# Patient Record
Sex: Male | Born: 1935 | Race: White | Hispanic: No | Marital: Married | Smoking: Former smoker
Health system: Southern US, Community
[De-identification: ages and names within clinical notes are randomized; demographics above are authoritative.]

## PROBLEM LIST (undated history)

## (undated) DIAGNOSIS — K469 Unspecified abdominal hernia without obstruction or gangrene: Secondary | ICD-10-CM

## (undated) DIAGNOSIS — M4800 Spinal stenosis, site unspecified: Secondary | ICD-10-CM

## (undated) DIAGNOSIS — Z86718 Personal history of other venous thrombosis and embolism: Secondary | ICD-10-CM

## (undated) DIAGNOSIS — G609 Hereditary and idiopathic neuropathy, unspecified: Secondary | ICD-10-CM

## (undated) DIAGNOSIS — N329 Bladder disorder, unspecified: Secondary | ICD-10-CM

## (undated) DIAGNOSIS — K219 Gastro-esophageal reflux disease without esophagitis: Secondary | ICD-10-CM

## (undated) DIAGNOSIS — M4180 Other forms of scoliosis, site unspecified: Secondary | ICD-10-CM

## (undated) DIAGNOSIS — I272 Pulmonary hypertension, unspecified: Secondary | ICD-10-CM

## (undated) DIAGNOSIS — R269 Unspecified abnormalities of gait and mobility: Secondary | ICD-10-CM

## (undated) DIAGNOSIS — H3530 Unspecified macular degeneration: Secondary | ICD-10-CM

## (undated) DIAGNOSIS — J439 Emphysema, unspecified: Secondary | ICD-10-CM

## (undated) DIAGNOSIS — Z974 Presence of external hearing-aid: Secondary | ICD-10-CM

## (undated) DIAGNOSIS — I480 Paroxysmal atrial fibrillation: Secondary | ICD-10-CM

## (undated) DIAGNOSIS — G4733 Obstructive sleep apnea (adult) (pediatric): Secondary | ICD-10-CM

## (undated) DIAGNOSIS — R351 Nocturia: Secondary | ICD-10-CM

## (undated) DIAGNOSIS — Z950 Presence of cardiac pacemaker: Secondary | ICD-10-CM

## (undated) DIAGNOSIS — M5137 Other intervertebral disc degeneration, lumbosacral region: Secondary | ICD-10-CM

## (undated) DIAGNOSIS — D509 Iron deficiency anemia, unspecified: Secondary | ICD-10-CM

## (undated) DIAGNOSIS — M51379 Other intervertebral disc degeneration, lumbosacral region without mention of lumbar back pain or lower extremity pain: Secondary | ICD-10-CM

## (undated) DIAGNOSIS — R519 Headache, unspecified: Secondary | ICD-10-CM

## (undated) DIAGNOSIS — I340 Nonrheumatic mitral (valve) insufficiency: Secondary | ICD-10-CM

## (undated) DIAGNOSIS — N183 Chronic kidney disease, stage 3 unspecified: Secondary | ICD-10-CM

## (undated) DIAGNOSIS — Z8546 Personal history of malignant neoplasm of prostate: Secondary | ICD-10-CM

## (undated) DIAGNOSIS — Z8719 Personal history of other diseases of the digestive system: Secondary | ICD-10-CM

## (undated) DIAGNOSIS — R310 Gross hematuria: Secondary | ICD-10-CM

## (undated) DIAGNOSIS — M545 Low back pain, unspecified: Secondary | ICD-10-CM

## (undated) DIAGNOSIS — K08109 Complete loss of teeth, unspecified cause, unspecified class: Secondary | ICD-10-CM

## (undated) DIAGNOSIS — Z973 Presence of spectacles and contact lenses: Secondary | ICD-10-CM

## (undated) DIAGNOSIS — R51 Headache: Secondary | ICD-10-CM

## (undated) DIAGNOSIS — R0600 Dyspnea, unspecified: Secondary | ICD-10-CM

## (undated) DIAGNOSIS — R600 Localized edema: Secondary | ICD-10-CM

## (undated) DIAGNOSIS — Z9989 Dependence on other enabling machines and devices: Secondary | ICD-10-CM

## (undated) DIAGNOSIS — C61 Malignant neoplasm of prostate: Secondary | ICD-10-CM

## (undated) DIAGNOSIS — Z8709 Personal history of other diseases of the respiratory system: Secondary | ICD-10-CM

## (undated) DIAGNOSIS — Z923 Personal history of irradiation: Secondary | ICD-10-CM

## (undated) DIAGNOSIS — G8929 Other chronic pain: Secondary | ICD-10-CM

## (undated) DIAGNOSIS — I872 Venous insufficiency (chronic) (peripheral): Secondary | ICD-10-CM

## (undated) DIAGNOSIS — D638 Anemia in other chronic diseases classified elsewhere: Secondary | ICD-10-CM

## (undated) DIAGNOSIS — C801 Malignant (primary) neoplasm, unspecified: Secondary | ICD-10-CM

## (undated) DIAGNOSIS — I10 Essential (primary) hypertension: Secondary | ICD-10-CM

## (undated) DIAGNOSIS — K449 Diaphragmatic hernia without obstruction or gangrene: Secondary | ICD-10-CM

## (undated) DIAGNOSIS — Z972 Presence of dental prosthetic device (complete) (partial): Secondary | ICD-10-CM

## (undated) DIAGNOSIS — I495 Sick sinus syndrome: Secondary | ICD-10-CM

## (undated) DIAGNOSIS — G5602 Carpal tunnel syndrome, left upper limb: Secondary | ICD-10-CM

## (undated) DIAGNOSIS — M1990 Unspecified osteoarthritis, unspecified site: Secondary | ICD-10-CM

## (undated) DIAGNOSIS — M4150 Other secondary scoliosis, site unspecified: Secondary | ICD-10-CM

## (undated) HISTORY — DX: Anemia in other chronic diseases classified elsewhere: D63.8

## (undated) HISTORY — DX: Carpal tunnel syndrome, left upper limb: G56.02

## (undated) HISTORY — DX: Unspecified macular degeneration: H35.30

## (undated) HISTORY — PX: TOTAL KNEE ARTHROPLASTY: SHX125

## (undated) HISTORY — PX: LAPAROSCOPIC ASSISTED VENTRAL HERNIA REPAIR: SHX6312

## (undated) HISTORY — PX: TRANSTHORACIC ECHOCARDIOGRAM: SHX275

## (undated) HISTORY — DX: Essential (primary) hypertension: I10

## (undated) HISTORY — DX: Unspecified abnormalities of gait and mobility: R26.9

## (undated) HISTORY — PX: SHOULDER ARTHROSCOPY WITH DISTAL CLAVICLE RESECTION: SHX5675

## (undated) HISTORY — DX: Venous insufficiency (chronic) (peripheral): I87.2

## (undated) HISTORY — PX: OTHER SURGICAL HISTORY: SHX169

## (undated) HISTORY — DX: Unspecified abdominal hernia without obstruction or gangrene: K46.9

## (undated) HISTORY — PX: CARDIAC CATHETERIZATION: SHX172

## (undated) HISTORY — PX: LAPAROSCOPIC INGUINAL HERNIA REPAIR: SUR788

## (undated) HISTORY — PX: COLONOSCOPY: SHX174

## (undated) HISTORY — PX: CARDIAC PACEMAKER PLACEMENT: SHX583

## (undated) HISTORY — PX: CATARACT EXTRACTION W/ INTRAOCULAR LENS  IMPLANT, BILATERAL: SHX1307

## (undated) HISTORY — PX: CARDIOVASCULAR STRESS TEST: SHX262

## (undated) HISTORY — DX: Malignant neoplasm of prostate: C61

## (undated) HISTORY — PX: INGUINAL HERNIA REPAIR: SUR1180

## (undated) HISTORY — DX: Unspecified osteoarthritis, unspecified site: M19.90

## (undated) HISTORY — DX: Spinal stenosis, site unspecified: M48.00

---

## 1997-10-10 ENCOUNTER — Ambulatory Visit (HOSPITAL_COMMUNITY): Admission: RE | Admit: 1997-10-10 | Discharge: 1997-10-10 | Payer: Self-pay | Admitting: Gastroenterology

## 1998-07-17 ENCOUNTER — Other Ambulatory Visit: Admission: RE | Admit: 1998-07-17 | Discharge: 1998-07-17 | Payer: Self-pay | Admitting: Urology

## 1998-12-18 ENCOUNTER — Other Ambulatory Visit: Admission: RE | Admit: 1998-12-18 | Discharge: 1998-12-18 | Payer: Self-pay | Admitting: Urology

## 1999-07-23 ENCOUNTER — Encounter: Payer: Self-pay | Admitting: General Surgery

## 1999-07-24 ENCOUNTER — Ambulatory Visit (HOSPITAL_COMMUNITY): Admission: RE | Admit: 1999-07-24 | Discharge: 1999-07-24 | Payer: Self-pay | Admitting: General Surgery

## 2000-03-11 ENCOUNTER — Ambulatory Visit (HOSPITAL_COMMUNITY): Admission: RE | Admit: 2000-03-11 | Discharge: 2000-03-11 | Payer: Self-pay | Admitting: Gastroenterology

## 2000-08-28 ENCOUNTER — Encounter: Payer: Self-pay | Admitting: Emergency Medicine

## 2000-08-28 ENCOUNTER — Inpatient Hospital Stay (HOSPITAL_COMMUNITY): Admission: EM | Admit: 2000-08-28 | Discharge: 2000-08-29 | Payer: Self-pay | Admitting: Emergency Medicine

## 2000-10-03 ENCOUNTER — Encounter: Payer: Self-pay | Admitting: Specialist

## 2000-10-03 ENCOUNTER — Encounter: Admission: RE | Admit: 2000-10-03 | Discharge: 2000-10-03 | Payer: Self-pay | Admitting: Specialist

## 2001-08-07 ENCOUNTER — Encounter: Admission: RE | Admit: 2001-08-07 | Discharge: 2001-08-07 | Payer: Self-pay | Admitting: Geriatric Medicine

## 2001-08-07 ENCOUNTER — Encounter: Payer: Self-pay | Admitting: Geriatric Medicine

## 2002-01-22 ENCOUNTER — Encounter: Payer: Self-pay | Admitting: Specialist

## 2002-01-22 ENCOUNTER — Encounter: Admission: RE | Admit: 2002-01-22 | Discharge: 2002-01-22 | Payer: Self-pay | Admitting: Specialist

## 2003-02-04 ENCOUNTER — Ambulatory Visit (HOSPITAL_COMMUNITY): Admission: RE | Admit: 2003-02-04 | Discharge: 2003-02-04 | Payer: Self-pay | Admitting: Geriatric Medicine

## 2003-03-04 ENCOUNTER — Ambulatory Visit (HOSPITAL_COMMUNITY): Admission: RE | Admit: 2003-03-04 | Discharge: 2003-03-04 | Payer: Self-pay | Admitting: Neurosurgery

## 2004-08-12 ENCOUNTER — Encounter: Admission: RE | Admit: 2004-08-12 | Discharge: 2004-08-12 | Payer: Self-pay | Admitting: Geriatric Medicine

## 2004-10-14 ENCOUNTER — Encounter: Admission: RE | Admit: 2004-10-14 | Discharge: 2004-11-10 | Payer: Self-pay | Admitting: Neurology

## 2004-11-11 ENCOUNTER — Encounter: Admission: RE | Admit: 2004-11-11 | Discharge: 2004-12-14 | Payer: Self-pay | Admitting: Neurology

## 2005-03-08 ENCOUNTER — Ambulatory Visit (HOSPITAL_COMMUNITY): Admission: RE | Admit: 2005-03-08 | Discharge: 2005-03-08 | Payer: Self-pay | Admitting: Anesthesiology

## 2005-04-07 ENCOUNTER — Encounter: Admission: RE | Admit: 2005-04-07 | Discharge: 2005-04-07 | Payer: Self-pay | Admitting: Anesthesiology

## 2005-05-10 ENCOUNTER — Encounter: Admission: RE | Admit: 2005-05-10 | Discharge: 2005-05-10 | Payer: Self-pay | Admitting: Otolaryngology

## 2005-08-30 ENCOUNTER — Encounter: Admission: RE | Admit: 2005-08-30 | Discharge: 2005-08-30 | Payer: Self-pay | Admitting: Geriatric Medicine

## 2006-02-15 DIAGNOSIS — Z8546 Personal history of malignant neoplasm of prostate: Secondary | ICD-10-CM

## 2006-02-15 DIAGNOSIS — Z923 Personal history of irradiation: Secondary | ICD-10-CM

## 2006-02-15 HISTORY — DX: Personal history of irradiation: Z92.3

## 2006-02-15 HISTORY — DX: Personal history of malignant neoplasm of prostate: Z85.46

## 2006-08-08 ENCOUNTER — Encounter: Admission: RE | Admit: 2006-08-08 | Discharge: 2006-08-08 | Payer: Self-pay | Admitting: Anesthesiology

## 2006-08-22 ENCOUNTER — Ambulatory Visit (HOSPITAL_COMMUNITY): Admission: RE | Admit: 2006-08-22 | Discharge: 2006-08-22 | Payer: Self-pay | Admitting: Geriatric Medicine

## 2006-09-15 ENCOUNTER — Ambulatory Visit (HOSPITAL_COMMUNITY): Admission: RE | Admit: 2006-09-15 | Discharge: 2006-09-15 | Payer: Self-pay | Admitting: Urology

## 2006-09-16 DIAGNOSIS — Z8709 Personal history of other diseases of the respiratory system: Secondary | ICD-10-CM

## 2006-09-16 HISTORY — DX: Personal history of other diseases of the respiratory system: Z87.09

## 2006-09-19 ENCOUNTER — Ambulatory Visit: Admission: RE | Admit: 2006-09-19 | Discharge: 2006-12-18 | Payer: Self-pay | Admitting: Radiation Oncology

## 2006-10-09 ENCOUNTER — Encounter: Admission: RE | Admit: 2006-10-09 | Discharge: 2006-10-09 | Payer: Self-pay | Admitting: Anesthesiology

## 2006-10-11 ENCOUNTER — Ambulatory Visit: Payer: Self-pay | Admitting: Pulmonary Disease

## 2006-10-11 ENCOUNTER — Inpatient Hospital Stay (HOSPITAL_COMMUNITY): Admission: AD | Admit: 2006-10-11 | Discharge: 2006-10-18 | Payer: Self-pay | Admitting: Cardiology

## 2006-10-11 DIAGNOSIS — Z950 Presence of cardiac pacemaker: Secondary | ICD-10-CM

## 2006-10-11 HISTORY — DX: Presence of cardiac pacemaker: Z95.0

## 2006-10-24 ENCOUNTER — Ambulatory Visit: Payer: Self-pay | Admitting: Pulmonary Disease

## 2006-11-15 ENCOUNTER — Ambulatory Visit: Payer: Self-pay | Admitting: Pulmonary Disease

## 2006-12-20 ENCOUNTER — Ambulatory Visit: Admission: RE | Admit: 2006-12-20 | Discharge: 2007-02-15 | Payer: Self-pay | Admitting: Radiation Oncology

## 2007-01-19 ENCOUNTER — Ambulatory Visit: Payer: Self-pay | Admitting: Oncology

## 2007-02-15 LAB — CBC WITH DIFFERENTIAL/PLATELET
BASO%: 0.4 % (ref 0.0–2.0)
EOS%: 2.5 % (ref 0.0–7.0)
MCH: 32.4 pg (ref 28.0–33.4)
MCHC: 34.8 g/dL (ref 32.0–35.9)
MONO#: 0.4 10*3/uL (ref 0.1–0.9)
NEUT%: 74.1 % (ref 40.0–75.0)
RBC: 3.15 10*6/uL — ABNORMAL LOW (ref 4.20–5.71)
WBC: 6.1 10*3/uL (ref 4.0–10.0)
lymph#: 1 10*3/uL (ref 0.9–3.3)

## 2007-02-15 LAB — CHCC SMEAR

## 2007-02-16 ENCOUNTER — Ambulatory Visit: Admission: RE | Admit: 2007-02-16 | Discharge: 2007-02-28 | Payer: Self-pay | Admitting: Radiation Oncology

## 2007-02-20 LAB — COMPREHENSIVE METABOLIC PANEL
AST: 21 U/L (ref 0–37)
Albumin: 4.1 g/dL (ref 3.5–5.2)
Alkaline Phosphatase: 56 U/L (ref 39–117)
BUN: 24 mg/dL — ABNORMAL HIGH (ref 6–23)
Creatinine, Ser: 1.15 mg/dL (ref 0.40–1.50)
Glucose, Bld: 98 mg/dL (ref 70–99)
Potassium: 4.4 mEq/L (ref 3.5–5.3)

## 2007-02-20 LAB — IRON AND TIBC
%SAT: 24 % (ref 20–55)
TIBC: 249 ug/dL (ref 215–435)
UIBC: 190 ug/dL

## 2007-02-20 LAB — SPEP & IFE WITH QIG
Beta 2: 4 % (ref 3.2–6.5)
Gamma Globulin: 13.6 % (ref 11.1–18.8)
IgA: 169 mg/dL (ref 68–378)
IgG (Immunoglobin G), Serum: 937 mg/dL (ref 694–1618)
IgM, Serum: 268 mg/dL — ABNORMAL HIGH (ref 60–263)

## 2007-02-20 LAB — FERRITIN: Ferritin: 292 ng/mL (ref 22–322)

## 2007-02-20 LAB — ERYTHROPOIETIN: Erythropoietin: 35.6 m[IU]/mL — ABNORMAL HIGH (ref 2.6–34.0)

## 2007-03-06 ENCOUNTER — Ambulatory Visit: Payer: Self-pay | Admitting: Oncology

## 2007-03-08 LAB — CBC WITH DIFFERENTIAL/PLATELET
Basophils Absolute: 0 10*3/uL (ref 0.0–0.1)
EOS%: 0.8 % (ref 0.0–7.0)
HGB: 10.7 g/dL — ABNORMAL LOW (ref 13.0–17.1)
MCH: 32.1 pg (ref 28.0–33.4)
MCV: 93.4 fL (ref 81.6–98.0)
MONO%: 4.3 % (ref 0.0–13.0)
NEUT#: 5.2 10*3/uL (ref 1.5–6.5)
RBC: 3.32 10*6/uL — ABNORMAL LOW (ref 4.20–5.71)
RDW: 12.8 % (ref 11.2–14.6)
lymph#: 0.8 10*3/uL — ABNORMAL LOW (ref 0.9–3.3)

## 2007-03-27 LAB — CBC WITH DIFFERENTIAL/PLATELET
Basophils Absolute: 0 10*3/uL (ref 0.0–0.1)
EOS%: 2.3 % (ref 0.0–7.0)
Eosinophils Absolute: 0.1 10*3/uL (ref 0.0–0.5)
HCT: 32.5 % — ABNORMAL LOW (ref 38.7–49.9)
HGB: 11.1 g/dL — ABNORMAL LOW (ref 13.0–17.1)
LYMPH%: 15 % (ref 14.0–48.0)
MCH: 32.3 pg (ref 28.0–33.4)
MCV: 94.6 fL (ref 81.6–98.0)
MONO%: 5 % (ref 0.0–13.0)
NEUT#: 4.6 10*3/uL (ref 1.5–6.5)
NEUT%: 77.4 % — ABNORMAL HIGH (ref 40.0–75.0)
Platelets: 159 10*3/uL (ref 145–400)

## 2007-04-19 LAB — CBC WITH DIFFERENTIAL/PLATELET
Basophils Absolute: 0.1 10*3/uL (ref 0.0–0.1)
EOS%: 5.2 % (ref 0.0–7.0)
Eosinophils Absolute: 0.4 10*3/uL (ref 0.0–0.5)
HCT: 33.6 % — ABNORMAL LOW (ref 38.7–49.9)
HGB: 11.7 g/dL — ABNORMAL LOW (ref 13.0–17.1)
MCH: 32.6 pg (ref 28.0–33.4)
MONO#: 0.5 10*3/uL (ref 0.1–0.9)
NEUT#: 5.4 10*3/uL (ref 1.5–6.5)
NEUT%: 71.9 % (ref 40.0–75.0)
RDW: 12.7 % (ref 11.2–14.6)
lymph#: 1.2 10*3/uL (ref 0.9–3.3)

## 2007-05-08 ENCOUNTER — Ambulatory Visit: Payer: Self-pay | Admitting: Oncology

## 2007-06-07 LAB — CBC WITH DIFFERENTIAL/PLATELET
BASO%: 0.2 % (ref 0.0–2.0)
HCT: 31.5 % — ABNORMAL LOW (ref 38.7–49.9)
HGB: 11 g/dL — ABNORMAL LOW (ref 13.0–17.1)
MCHC: 34.8 g/dL (ref 32.0–35.9)
MONO#: 0.5 10*3/uL (ref 0.1–0.9)
NEUT%: 70.3 % (ref 40.0–75.0)
RDW: 13.9 % (ref 11.2–14.6)
WBC: 6.6 10*3/uL (ref 4.0–10.0)
lymph#: 1.3 10*3/uL (ref 0.9–3.3)

## 2007-06-26 ENCOUNTER — Ambulatory Visit: Payer: Self-pay | Admitting: Oncology

## 2007-06-28 LAB — CBC WITH DIFFERENTIAL/PLATELET
BASO%: 0.5 % (ref 0.0–2.0)
EOS%: 2.3 % (ref 0.0–7.0)
HCT: 34 % — ABNORMAL LOW (ref 38.7–49.9)
LYMPH%: 18.4 % (ref 14.0–48.0)
MCH: 31.6 pg (ref 28.0–33.4)
MCHC: 34.5 g/dL (ref 32.0–35.9)
NEUT%: 74.3 % (ref 40.0–75.0)
Platelets: 168 10*3/uL (ref 145–400)
RDW: 14.9 % — ABNORMAL HIGH (ref 11.2–14.6)
lymph#: 1.3 10*3/uL (ref 0.9–3.3)

## 2007-07-21 LAB — CBC WITH DIFFERENTIAL/PLATELET
EOS%: 1.3 % (ref 0.0–7.0)
HGB: 12.8 g/dL — ABNORMAL LOW (ref 13.0–17.1)
MCH: 31.9 pg (ref 28.0–33.4)
MCHC: 34.4 g/dL (ref 32.0–35.9)
MONO%: 4.1 % (ref 0.0–13.0)
RBC: 4.03 10*6/uL — ABNORMAL LOW (ref 4.20–5.71)
RDW: 15.6 % — ABNORMAL HIGH (ref 11.2–14.6)
lymph#: 1.2 10*3/uL (ref 0.9–3.3)

## 2007-08-09 LAB — CBC WITH DIFFERENTIAL/PLATELET
BASO%: 0.3 % (ref 0.0–2.0)
HCT: 30.8 % — ABNORMAL LOW (ref 38.7–49.9)
LYMPH%: 17.7 % (ref 14.0–48.0)
MCH: 32.5 pg (ref 28.0–33.4)
MCHC: 35 g/dL (ref 32.0–35.9)
MCV: 92.9 fL (ref 81.6–98.0)
MONO#: 0.4 10*3/uL (ref 0.1–0.9)
MONO%: 5.8 % (ref 0.0–13.0)
NEUT%: 73.1 % (ref 40.0–75.0)
Platelets: 135 10*3/uL — ABNORMAL LOW (ref 145–400)

## 2007-08-28 ENCOUNTER — Ambulatory Visit: Payer: Self-pay | Admitting: Oncology

## 2007-08-30 LAB — CBC WITH DIFFERENTIAL/PLATELET
BASO%: 0.2 % (ref 0.0–2.0)
EOS%: 2.1 % (ref 0.0–7.0)
HCT: 34.5 % — ABNORMAL LOW (ref 38.7–49.9)
LYMPH%: 20.5 % (ref 14.0–48.0)
MCH: 32.8 pg (ref 28.0–33.4)
MCHC: 35.1 g/dL (ref 32.0–35.9)
MCV: 93.3 fL (ref 81.6–98.0)
MONO%: 5.7 % (ref 0.0–13.0)
NEUT%: 71.5 % (ref 40.0–75.0)
lymph#: 1.5 10*3/uL (ref 0.9–3.3)

## 2007-09-20 LAB — CBC WITH DIFFERENTIAL/PLATELET
Eosinophils Absolute: 0.2 10*3/uL (ref 0.0–0.5)
HCT: 30.4 % — ABNORMAL LOW (ref 38.7–49.9)
LYMPH%: 18.3 % (ref 14.0–48.0)
MONO#: 0.3 10*3/uL (ref 0.1–0.9)
NEUT#: 4.5 10*3/uL (ref 1.5–6.5)
NEUT%: 73.3 % (ref 40.0–75.0)
Platelets: 129 10*3/uL — ABNORMAL LOW (ref 145–400)
WBC: 6.2 10*3/uL (ref 4.0–10.0)

## 2007-10-06 ENCOUNTER — Encounter: Admission: RE | Admit: 2007-10-06 | Discharge: 2007-10-06 | Payer: Self-pay | Admitting: Orthopedic Surgery

## 2007-10-10 ENCOUNTER — Encounter: Admission: RE | Admit: 2007-10-10 | Discharge: 2007-10-10 | Payer: Self-pay | Admitting: Orthopaedic Surgery

## 2007-10-11 LAB — CBC WITH DIFFERENTIAL/PLATELET
BASO%: 0.4 % (ref 0.0–2.0)
HCT: 34.4 % — ABNORMAL LOW (ref 38.7–49.9)
LYMPH%: 21.9 % (ref 14.0–48.0)
MCH: 32.5 pg (ref 28.0–33.4)
MCHC: 34.5 g/dL (ref 32.0–35.9)
MONO#: 0.3 10*3/uL (ref 0.1–0.9)
NEUT%: 70.2 % (ref 40.0–75.0)
Platelets: 148 10*3/uL (ref 145–400)
WBC: 5.7 10*3/uL (ref 4.0–10.0)

## 2007-11-01 ENCOUNTER — Ambulatory Visit: Payer: Self-pay | Admitting: Oncology

## 2007-11-01 LAB — CBC WITH DIFFERENTIAL/PLATELET
BASO%: 0.3 % (ref 0.0–2.0)
Basophils Absolute: 0 10*3/uL (ref 0.0–0.1)
EOS%: 2.6 % (ref 0.0–7.0)
HCT: 35.2 % — ABNORMAL LOW (ref 38.7–49.9)
HGB: 12 g/dL — ABNORMAL LOW (ref 13.0–17.1)
MCH: 32.5 pg (ref 28.0–33.4)
MCHC: 34.1 g/dL (ref 32.0–35.9)
MCV: 95.3 fL (ref 81.6–98.0)
MONO%: 5.5 % (ref 0.0–13.0)
NEUT%: 72.3 % (ref 40.0–75.0)
lymph#: 1 10*3/uL (ref 0.9–3.3)

## 2007-11-07 ENCOUNTER — Ambulatory Visit (HOSPITAL_COMMUNITY): Admission: RE | Admit: 2007-11-07 | Discharge: 2007-11-07 | Payer: Self-pay | Admitting: Orthopaedic Surgery

## 2007-11-21 LAB — CBC WITH DIFFERENTIAL/PLATELET
BASO%: 0.3 % (ref 0.0–2.0)
Basophils Absolute: 0 10*3/uL (ref 0.0–0.1)
EOS%: 3.2 % (ref 0.0–7.0)
HCT: 33.1 % — ABNORMAL LOW (ref 38.7–49.9)
HGB: 11.3 g/dL — ABNORMAL LOW (ref 13.0–17.1)
LYMPH%: 15.8 % (ref 14.0–48.0)
MCH: 32.2 pg (ref 28.0–33.4)
MCHC: 34.2 g/dL (ref 32.0–35.9)
MONO#: 0.3 10*3/uL (ref 0.1–0.9)
NEUT%: 76.3 % — ABNORMAL HIGH (ref 40.0–75.0)
Platelets: 143 10*3/uL — ABNORMAL LOW (ref 145–400)
lymph#: 1.1 10*3/uL (ref 0.9–3.3)

## 2007-12-20 ENCOUNTER — Ambulatory Visit: Payer: Self-pay | Admitting: Oncology

## 2007-12-22 LAB — CBC WITH DIFFERENTIAL/PLATELET
BASO%: 0.7 % (ref 0.0–2.0)
Eosinophils Absolute: 0.3 10*3/uL (ref 0.0–0.5)
LYMPH%: 19 % (ref 14.0–48.0)
MCHC: 34.4 g/dL (ref 32.0–35.9)
MONO#: 0.4 10*3/uL (ref 0.1–0.9)
NEUT#: 5.7 10*3/uL (ref 1.5–6.5)
Platelets: 176 10*3/uL (ref 145–400)
RBC: 3.92 10*6/uL — ABNORMAL LOW (ref 4.20–5.71)
RDW: 14.3 % (ref 11.2–14.6)
WBC: 8 10*3/uL (ref 4.0–10.0)
lymph#: 1.5 10*3/uL (ref 0.9–3.3)

## 2008-01-02 LAB — CBC WITH DIFFERENTIAL/PLATELET
Basophils Absolute: 0 10*3/uL (ref 0.0–0.1)
Eosinophils Absolute: 0.1 10*3/uL (ref 0.0–0.5)
HCT: 34.7 % — ABNORMAL LOW (ref 38.7–49.9)
HGB: 12 g/dL — ABNORMAL LOW (ref 13.0–17.1)
LYMPH%: 10.2 % — ABNORMAL LOW (ref 14.0–48.0)
MONO#: 0.2 10*3/uL (ref 0.1–0.9)
NEUT#: 7.3 10*3/uL — ABNORMAL HIGH (ref 1.5–6.5)
NEUT%: 87 % — ABNORMAL HIGH (ref 40.0–75.0)
Platelets: 143 10*3/uL — ABNORMAL LOW (ref 145–400)
WBC: 8.4 10*3/uL (ref 4.0–10.0)
lymph#: 0.9 10*3/uL (ref 0.9–3.3)

## 2008-01-23 LAB — CBC WITH DIFFERENTIAL/PLATELET
Eosinophils Absolute: 0.2 10*3/uL (ref 0.0–0.5)
HCT: 32.6 % — ABNORMAL LOW (ref 38.7–49.9)
LYMPH%: 20.3 % (ref 14.0–48.0)
MONO#: 0.4 10*3/uL (ref 0.1–0.9)
NEUT#: 5 10*3/uL (ref 1.5–6.5)
Platelets: 148 10*3/uL (ref 145–400)
RBC: 3.51 10*6/uL — ABNORMAL LOW (ref 4.20–5.71)
WBC: 7.1 10*3/uL (ref 4.0–10.0)
lymph#: 1.4 10*3/uL (ref 0.9–3.3)

## 2008-02-07 ENCOUNTER — Ambulatory Visit: Payer: Self-pay | Admitting: Oncology

## 2008-02-13 LAB — CBC WITH DIFFERENTIAL/PLATELET
BASO%: 0.3 % (ref 0.0–2.0)
HCT: 37.3 % — ABNORMAL LOW (ref 38.7–49.9)
HGB: 12.6 g/dL — ABNORMAL LOW (ref 13.0–17.1)
MCH: 32.1 pg (ref 28.0–33.4)
MONO#: 0.3 10*3/uL (ref 0.1–0.9)
MONO%: 4.5 % (ref 0.0–13.0)
Platelets: 171 10*3/uL (ref 145–400)
lymph#: 1.2 10*3/uL (ref 0.9–3.3)

## 2008-03-22 ENCOUNTER — Ambulatory Visit: Payer: Self-pay | Admitting: Oncology

## 2008-04-16 LAB — CBC WITH DIFFERENTIAL/PLATELET
EOS%: 3.9 % (ref 0.0–7.0)
MCH: 32.4 pg (ref 27.2–33.4)
MCV: 95.6 fL (ref 79.3–98.0)
MONO%: 4.4 % (ref 0.0–14.0)
RBC: 3.88 10*6/uL — ABNORMAL LOW (ref 4.20–5.82)
RDW: 14.4 % (ref 11.0–14.6)

## 2008-05-07 ENCOUNTER — Ambulatory Visit: Payer: Self-pay | Admitting: Oncology

## 2008-05-07 LAB — CBC WITH DIFFERENTIAL/PLATELET
Eosinophils Absolute: 0.3 10*3/uL (ref 0.0–0.5)
MONO#: 0.5 10*3/uL (ref 0.1–0.9)
MONO%: 6.7 % (ref 0.0–14.0)
NEUT#: 4.6 10*3/uL (ref 1.5–6.5)
RBC: 3.67 10*6/uL — ABNORMAL LOW (ref 4.20–5.82)
RDW: 13.4 % (ref 11.0–14.6)
WBC: 7 10*3/uL (ref 4.0–10.3)

## 2008-05-10 ENCOUNTER — Emergency Department (HOSPITAL_COMMUNITY): Admission: EM | Admit: 2008-05-10 | Discharge: 2008-05-10 | Payer: Self-pay | Admitting: Emergency Medicine

## 2008-05-21 LAB — CBC WITH DIFFERENTIAL/PLATELET
Basophils Absolute: 0 10*3/uL (ref 0.0–0.1)
EOS%: 3.6 % (ref 0.0–7.0)
HGB: 12 g/dL — ABNORMAL LOW (ref 13.0–17.1)
MCH: 33 pg (ref 27.2–33.4)
MCV: 95.9 fL (ref 79.3–98.0)
MONO%: 3.7 % (ref 0.0–14.0)
NEUT%: 79.6 % — ABNORMAL HIGH (ref 39.0–75.0)
RDW: 15 % — ABNORMAL HIGH (ref 11.0–14.6)

## 2008-07-11 ENCOUNTER — Ambulatory Visit: Payer: Self-pay | Admitting: Oncology

## 2008-07-16 LAB — COMPREHENSIVE METABOLIC PANEL
AST: 24 U/L (ref 0–37)
Alkaline Phosphatase: 63 U/L (ref 39–117)
BUN: 34 mg/dL — ABNORMAL HIGH (ref 6–23)
Calcium: 9.2 mg/dL (ref 8.4–10.5)
Creatinine, Ser: 1.07 mg/dL (ref 0.40–1.50)
Total Bilirubin: 0.8 mg/dL (ref 0.3–1.2)

## 2008-07-16 LAB — CBC WITH DIFFERENTIAL/PLATELET
BASO%: 0.4 % (ref 0.0–2.0)
Basophils Absolute: 0 10*3/uL (ref 0.0–0.1)
EOS%: 1.5 % (ref 0.0–7.0)
HCT: 36.6 % — ABNORMAL LOW (ref 38.4–49.9)
HGB: 12.5 g/dL — ABNORMAL LOW (ref 13.0–17.1)
LYMPH%: 21.9 % (ref 14.0–49.0)
MCH: 32.7 pg (ref 27.2–33.4)
MCHC: 34.2 g/dL (ref 32.0–36.0)
MCV: 95.4 fL (ref 79.3–98.0)
MONO%: 5.7 % (ref 0.0–14.0)
NEUT%: 70.5 % (ref 39.0–75.0)

## 2008-08-05 ENCOUNTER — Ambulatory Visit (HOSPITAL_COMMUNITY): Admission: RE | Admit: 2008-08-05 | Discharge: 2008-08-05 | Payer: Self-pay | Admitting: General Surgery

## 2008-08-13 ENCOUNTER — Ambulatory Visit: Payer: Self-pay | Admitting: Oncology

## 2008-08-13 LAB — CBC WITH DIFFERENTIAL/PLATELET
Basophils Absolute: 0 10*3/uL (ref 0.0–0.1)
EOS%: 11.7 % — ABNORMAL HIGH (ref 0.0–7.0)
HCT: 31.4 % — ABNORMAL LOW (ref 38.4–49.9)
HGB: 11.1 g/dL — ABNORMAL LOW (ref 13.0–17.1)
LYMPH%: 17.5 % (ref 14.0–49.0)
MCH: 33.4 pg (ref 27.2–33.4)
MCV: 94.3 fL (ref 79.3–98.0)
NEUT%: 66 % (ref 39.0–75.0)
Platelets: 170 10*3/uL (ref 140–400)
lymph#: 1.2 10*3/uL (ref 0.9–3.3)

## 2008-09-02 ENCOUNTER — Ambulatory Visit (HOSPITAL_COMMUNITY): Admission: RE | Admit: 2008-09-02 | Discharge: 2008-09-02 | Payer: Self-pay | Admitting: Anesthesiology

## 2008-09-05 ENCOUNTER — Ambulatory Visit: Payer: Self-pay | Admitting: Oncology

## 2008-09-17 LAB — CBC WITH DIFFERENTIAL/PLATELET
BASO%: 0.4 % (ref 0.0–2.0)
Eosinophils Absolute: 0.4 10*3/uL (ref 0.0–0.5)
HCT: 34.3 % — ABNORMAL LOW (ref 38.4–49.9)
HGB: 11.5 g/dL — ABNORMAL LOW (ref 13.0–17.1)
LYMPH%: 18.7 % (ref 14.0–49.0)
MCHC: 33.6 g/dL (ref 32.0–36.0)
MONO#: 0.4 10*3/uL (ref 0.1–0.9)
NEUT#: 5 10*3/uL (ref 1.5–6.5)
NEUT%: 70.3 % (ref 39.0–75.0)
Platelets: 198 10*3/uL (ref 140–400)
WBC: 7.2 10*3/uL (ref 4.0–10.3)
lymph#: 1.3 10*3/uL (ref 0.9–3.3)

## 2008-10-04 ENCOUNTER — Ambulatory Visit: Payer: Self-pay | Admitting: Oncology

## 2008-10-08 LAB — CBC WITH DIFFERENTIAL/PLATELET
Basophils Absolute: 0 10*3/uL (ref 0.0–0.1)
HCT: 35.2 % — ABNORMAL LOW (ref 38.4–49.9)
HGB: 12 g/dL — ABNORMAL LOW (ref 13.0–17.1)
LYMPH%: 19.6 % (ref 14.0–49.0)
MCH: 32.4 pg (ref 27.2–33.4)
MCHC: 34.2 g/dL (ref 32.0–36.0)
MONO#: 0.3 10*3/uL (ref 0.1–0.9)
NEUT%: 70.3 % (ref 39.0–75.0)
Platelets: 145 10*3/uL (ref 140–400)
WBC: 6.2 10*3/uL (ref 4.0–10.3)
lymph#: 1.2 10*3/uL (ref 0.9–3.3)

## 2008-11-01 ENCOUNTER — Ambulatory Visit: Payer: Self-pay | Admitting: Oncology

## 2008-11-05 LAB — CBC WITH DIFFERENTIAL/PLATELET
BASO%: 0.5 % (ref 0.0–2.0)
Eosinophils Absolute: 0.2 10*3/uL (ref 0.0–0.5)
LYMPH%: 26.7 % (ref 14.0–49.0)
MCHC: 32.7 g/dL (ref 32.0–36.0)
MONO#: 0.3 10*3/uL (ref 0.1–0.9)
NEUT#: 4 10*3/uL (ref 1.5–6.5)
Platelets: 132 10*3/uL — ABNORMAL LOW (ref 140–400)
RBC: 4.01 10*6/uL — ABNORMAL LOW (ref 4.20–5.82)
RDW: 13.4 % (ref 11.0–14.6)
WBC: 6.2 10*3/uL (ref 4.0–10.3)
lymph#: 1.7 10*3/uL (ref 0.9–3.3)

## 2008-11-29 ENCOUNTER — Ambulatory Visit: Payer: Self-pay | Admitting: Oncology

## 2008-12-03 LAB — CBC WITH DIFFERENTIAL/PLATELET
BASO%: 0.3 % (ref 0.0–2.0)
EOS%: 3.9 % (ref 0.0–7.0)
Eosinophils Absolute: 0.3 10*3/uL (ref 0.0–0.5)
LYMPH%: 19.8 % (ref 14.0–49.0)
MCHC: 34.4 g/dL (ref 32.0–36.0)
MCV: 93.1 fL (ref 79.3–98.0)
MONO%: 5.2 % (ref 0.0–14.0)
NEUT#: 4.9 10*3/uL (ref 1.5–6.5)
Platelets: 149 10*3/uL (ref 140–400)
RBC: 3.73 10*6/uL — ABNORMAL LOW (ref 4.20–5.82)
RDW: 13.2 % (ref 11.0–14.6)

## 2008-12-27 ENCOUNTER — Ambulatory Visit: Payer: Self-pay | Admitting: Oncology

## 2008-12-31 LAB — CBC WITH DIFFERENTIAL/PLATELET
BASO%: 0.4 % (ref 0.0–2.0)
Eosinophils Absolute: 0.3 10*3/uL (ref 0.0–0.5)
MCHC: 33.2 g/dL (ref 32.0–36.0)
MONO#: 0.2 10*3/uL (ref 0.1–0.9)
MONO%: 2.7 % (ref 0.0–14.0)
NEUT#: 5.3 10*3/uL (ref 1.5–6.5)
RBC: 3.74 10*6/uL — ABNORMAL LOW (ref 4.20–5.82)
RDW: 14.9 % — ABNORMAL HIGH (ref 11.0–14.6)
WBC: 7.1 10*3/uL (ref 4.0–10.3)

## 2008-12-31 LAB — COMPREHENSIVE METABOLIC PANEL
ALT: 19 U/L (ref 0–53)
Albumin: 3.9 g/dL (ref 3.5–5.2)
Alkaline Phosphatase: 56 U/L (ref 39–117)
CO2: 27 mEq/L (ref 19–32)
Glucose, Bld: 120 mg/dL — ABNORMAL HIGH (ref 70–99)
Potassium: 3.8 mEq/L (ref 3.5–5.3)
Sodium: 136 mEq/L (ref 135–145)
Total Protein: 6.6 g/dL (ref 6.0–8.3)

## 2009-01-28 ENCOUNTER — Ambulatory Visit: Payer: Self-pay | Admitting: Oncology

## 2009-01-28 LAB — CBC WITH DIFFERENTIAL/PLATELET
Eosinophils Absolute: 0.3 10*3/uL (ref 0.0–0.5)
LYMPH%: 23.6 % (ref 14.0–49.0)
MONO#: 0.3 10*3/uL (ref 0.1–0.9)
NEUT#: 4.8 10*3/uL (ref 1.5–6.5)
Platelets: 167 10*3/uL (ref 140–400)
RBC: 3.61 10*6/uL — ABNORMAL LOW (ref 4.20–5.82)
RDW: 14 % (ref 11.0–14.6)
WBC: 7.1 10*3/uL (ref 4.0–10.3)
lymph#: 1.7 10*3/uL (ref 0.9–3.3)

## 2009-01-28 LAB — COMPREHENSIVE METABOLIC PANEL
Albumin: 4 g/dL (ref 3.5–5.2)
CO2: 31 mEq/L (ref 19–32)
Calcium: 9.8 mg/dL (ref 8.4–10.5)
Chloride: 104 mEq/L (ref 96–112)
Glucose, Bld: 106 mg/dL — ABNORMAL HIGH (ref 70–99)
Potassium: 4.3 mEq/L (ref 3.5–5.3)
Sodium: 142 mEq/L (ref 135–145)
Total Protein: 6.6 g/dL (ref 6.0–8.3)

## 2009-02-21 ENCOUNTER — Ambulatory Visit: Payer: Self-pay | Admitting: Oncology

## 2009-02-25 LAB — CBC WITH DIFFERENTIAL/PLATELET
Basophils Absolute: 0 10*3/uL (ref 0.0–0.1)
EOS%: 3.4 % (ref 0.0–7.0)
Eosinophils Absolute: 0.2 10*3/uL (ref 0.0–0.5)
HGB: 12.4 g/dL — ABNORMAL LOW (ref 13.0–17.1)
LYMPH%: 21.1 % (ref 14.0–49.0)
MCH: 33 pg (ref 27.2–33.4)
MCV: 98.8 fL — ABNORMAL HIGH (ref 79.3–98.0)
MONO%: 5.4 % (ref 0.0–14.0)
NEUT#: 4.4 10*3/uL (ref 1.5–6.5)
NEUT%: 69.8 % (ref 39.0–75.0)
Platelets: 154 10*3/uL (ref 140–400)
RDW: 13.8 % (ref 11.0–14.6)

## 2009-03-28 ENCOUNTER — Ambulatory Visit: Payer: Self-pay | Admitting: Oncology

## 2009-04-01 LAB — CBC WITH DIFFERENTIAL/PLATELET
Eosinophils Absolute: 0.2 10*3/uL (ref 0.0–0.5)
MONO#: 0.4 10*3/uL (ref 0.1–0.9)
NEUT#: 6.8 10*3/uL — ABNORMAL HIGH (ref 1.5–6.5)
RBC: 3.75 10*6/uL — ABNORMAL LOW (ref 4.20–5.82)
RDW: 13.6 % (ref 11.0–14.6)
WBC: 8.9 10*3/uL (ref 4.0–10.3)

## 2009-04-07 ENCOUNTER — Ambulatory Visit (HOSPITAL_COMMUNITY): Admission: RE | Admit: 2009-04-07 | Discharge: 2009-04-07 | Payer: Self-pay | Admitting: Anesthesiology

## 2009-04-22 LAB — CBC WITH DIFFERENTIAL/PLATELET
EOS%: 2.6 % (ref 0.0–7.0)
Eosinophils Absolute: 0.2 10*3/uL (ref 0.0–0.5)
MCH: 32.9 pg (ref 27.2–33.4)
MCV: 97.1 fL (ref 79.3–98.0)
MONO%: 5.2 % (ref 0.0–14.0)
NEUT#: 5.1 10*3/uL (ref 1.5–6.5)
RBC: 3.43 10*6/uL — ABNORMAL LOW (ref 4.20–5.82)
RDW: 14 % (ref 11.0–14.6)
lymph#: 1.3 10*3/uL (ref 0.9–3.3)

## 2009-05-16 ENCOUNTER — Ambulatory Visit: Payer: Self-pay | Admitting: Oncology

## 2009-05-20 LAB — CBC WITH DIFFERENTIAL/PLATELET
BASO%: 0.2 % (ref 0.0–2.0)
Basophils Absolute: 0 10*3/uL (ref 0.0–0.1)
EOS%: 3.4 % (ref 0.0–7.0)
HGB: 12.1 g/dL — ABNORMAL LOW (ref 13.0–17.1)
MCH: 32.6 pg (ref 27.2–33.4)
MCHC: 33.9 g/dL (ref 32.0–36.0)
MCV: 96.1 fL (ref 79.3–98.0)
MONO%: 5.5 % (ref 0.0–14.0)
RDW: 14.1 % (ref 11.0–14.6)

## 2009-06-16 ENCOUNTER — Ambulatory Visit: Payer: Self-pay | Admitting: Oncology

## 2009-06-17 LAB — CBC WITH DIFFERENTIAL/PLATELET
Basophils Absolute: 0 10*3/uL (ref 0.0–0.1)
HCT: 32.5 % — ABNORMAL LOW (ref 38.4–49.9)
HGB: 11.2 g/dL — ABNORMAL LOW (ref 13.0–17.1)
LYMPH%: 10.7 % — ABNORMAL LOW (ref 14.0–49.0)
MCHC: 34.6 g/dL (ref 32.0–36.0)
MONO#: 0.4 10*3/uL (ref 0.1–0.9)
NEUT%: 84.6 % — ABNORMAL HIGH (ref 39.0–75.0)
Platelets: 147 10*3/uL (ref 140–400)
WBC: 9.5 10*3/uL (ref 4.0–10.3)
lymph#: 1 10*3/uL (ref 0.9–3.3)

## 2009-07-15 LAB — CBC WITH DIFFERENTIAL/PLATELET
BASO%: 0.2 % (ref 0.0–2.0)
Basophils Absolute: 0 10*3/uL (ref 0.0–0.1)
EOS%: 3.1 % (ref 0.0–7.0)
HCT: 36.9 % — ABNORMAL LOW (ref 38.4–49.9)
HGB: 12.6 g/dL — ABNORMAL LOW (ref 13.0–17.1)
LYMPH%: 20.5 % (ref 14.0–49.0)
MCH: 32.5 pg (ref 27.2–33.4)
MCHC: 34.1 g/dL (ref 32.0–36.0)
MCV: 95.4 fL (ref 79.3–98.0)
MONO%: 5.2 % (ref 0.0–14.0)
NEUT%: 71 % (ref 39.0–75.0)
Platelets: 153 10*3/uL (ref 140–400)
lymph#: 1.6 10*3/uL (ref 0.9–3.3)

## 2009-07-25 ENCOUNTER — Ambulatory Visit (HOSPITAL_COMMUNITY): Admission: RE | Admit: 2009-07-25 | Discharge: 2009-07-25 | Payer: Self-pay | Admitting: General Surgery

## 2009-08-05 ENCOUNTER — Ambulatory Visit: Payer: Self-pay | Admitting: Oncology

## 2009-08-05 LAB — CBC WITH DIFFERENTIAL/PLATELET
BASO%: 0.3 % (ref 0.0–2.0)
Eosinophils Absolute: 0.3 10*3/uL (ref 0.0–0.5)
HGB: 12 g/dL — ABNORMAL LOW (ref 13.0–17.1)
LYMPH%: 21.8 % (ref 14.0–49.0)
MCH: 32.7 pg (ref 27.2–33.4)
MCV: 94.6 fL (ref 79.3–98.0)
MONO#: 0.4 10*3/uL (ref 0.1–0.9)
NEUT%: 66.9 % (ref 39.0–75.0)
RDW: 14.1 % (ref 11.0–14.6)

## 2009-08-08 ENCOUNTER — Inpatient Hospital Stay (HOSPITAL_COMMUNITY)
Admission: RE | Admit: 2009-08-08 | Discharge: 2009-08-15 | Payer: Self-pay | Source: Home / Self Care | Admitting: General Surgery

## 2009-09-05 ENCOUNTER — Ambulatory Visit: Payer: Self-pay | Admitting: Oncology

## 2009-09-09 LAB — CBC WITH DIFFERENTIAL/PLATELET
Basophils Absolute: 0 10*3/uL (ref 0.0–0.1)
EOS%: 13.1 % — ABNORMAL HIGH (ref 0.0–7.0)
HCT: 33.8 % — ABNORMAL LOW (ref 38.4–49.9)
HGB: 11.5 g/dL — ABNORMAL LOW (ref 13.0–17.1)
LYMPH%: 18 % (ref 14.0–49.0)
MCH: 32.2 pg (ref 27.2–33.4)
MCV: 94.6 fL (ref 79.3–98.0)
MONO%: 4.5 % (ref 0.0–14.0)
NEUT%: 64.3 % (ref 39.0–75.0)
Platelets: 178 10*3/uL (ref 140–400)
lymph#: 1.6 10*3/uL (ref 0.9–3.3)

## 2009-09-24 ENCOUNTER — Emergency Department (HOSPITAL_COMMUNITY): Admission: EM | Admit: 2009-09-24 | Discharge: 2009-09-24 | Payer: Self-pay | Admitting: Emergency Medicine

## 2009-10-07 ENCOUNTER — Ambulatory Visit: Payer: Self-pay | Admitting: Oncology

## 2009-10-07 LAB — CBC WITH DIFFERENTIAL/PLATELET
Basophils Absolute: 0 10*3/uL (ref 0.0–0.1)
Eosinophils Absolute: 0.3 10*3/uL (ref 0.0–0.5)
HCT: 34.1 % — ABNORMAL LOW (ref 38.4–49.9)
HGB: 11.6 g/dL — ABNORMAL LOW (ref 13.0–17.1)
MONO#: 0.5 10*3/uL (ref 0.1–0.9)
NEUT%: 68.5 % (ref 39.0–75.0)
Platelets: 190 10*3/uL (ref 140–400)
WBC: 8.3 10*3/uL (ref 4.0–10.3)
lymph#: 1.7 10*3/uL (ref 0.9–3.3)

## 2009-11-04 LAB — CBC WITH DIFFERENTIAL/PLATELET
BASO%: 0.2 % (ref 0.0–2.0)
Eosinophils Absolute: 0.2 10*3/uL (ref 0.0–0.5)
HCT: 37.4 % — ABNORMAL LOW (ref 38.4–49.9)
MCHC: 33.4 g/dL (ref 32.0–36.0)
MONO#: 0.3 10*3/uL (ref 0.1–0.9)
NEUT#: 5 10*3/uL (ref 1.5–6.5)
RBC: 3.91 10*6/uL — ABNORMAL LOW (ref 4.20–5.82)
WBC: 7.1 10*3/uL (ref 4.0–10.3)
lymph#: 1.6 10*3/uL (ref 0.9–3.3)

## 2009-11-28 ENCOUNTER — Ambulatory Visit: Payer: Self-pay | Admitting: Oncology

## 2009-12-02 LAB — CBC WITH DIFFERENTIAL/PLATELET
BASO%: 0.3 % (ref 0.0–2.0)
EOS%: 4.3 % (ref 0.0–7.0)
Eosinophils Absolute: 0.3 10*3/uL (ref 0.0–0.5)
LYMPH%: 22.5 % (ref 14.0–49.0)
MCH: 32.5 pg (ref 27.2–33.4)
MCHC: 34.6 g/dL (ref 32.0–36.0)
MCV: 93.9 fL (ref 79.3–98.0)
MONO%: 4.3 % (ref 0.0–14.0)
Platelets: 150 10*3/uL (ref 140–400)
RBC: 3.5 10*6/uL — ABNORMAL LOW (ref 4.20–5.82)
RDW: 14.5 % (ref 11.0–14.6)

## 2009-12-29 ENCOUNTER — Ambulatory Visit: Payer: Self-pay | Admitting: Oncology

## 2010-01-01 LAB — CBC WITH DIFFERENTIAL/PLATELET
BASO%: 0.5 % (ref 0.0–2.0)
HCT: 34.9 % — ABNORMAL LOW (ref 38.4–49.9)
LYMPH%: 21.5 % (ref 14.0–49.0)
MCHC: 34.3 g/dL (ref 32.0–36.0)
MONO#: 0.3 10*3/uL (ref 0.1–0.9)
NEUT%: 68.5 % (ref 39.0–75.0)
Platelets: 151 10*3/uL (ref 140–400)
WBC: 5.3 10*3/uL (ref 4.0–10.3)

## 2010-01-27 LAB — CBC WITH DIFFERENTIAL/PLATELET
Basophils Absolute: 0 10*3/uL (ref 0.0–0.1)
Eosinophils Absolute: 0.1 10*3/uL (ref 0.0–0.5)
HCT: 33.7 % — ABNORMAL LOW (ref 38.4–49.9)
HGB: 11.7 g/dL — ABNORMAL LOW (ref 13.0–17.1)
LYMPH%: 19 % (ref 14.0–49.0)
MONO#: 0.4 10*3/uL (ref 0.1–0.9)
NEUT#: 4.9 10*3/uL (ref 1.5–6.5)
NEUT%: 73.4 % (ref 39.0–75.0)
Platelets: 153 10*3/uL (ref 140–400)
RBC: 3.49 10*6/uL — ABNORMAL LOW (ref 4.20–5.82)
WBC: 6.7 10*3/uL (ref 4.0–10.3)

## 2010-02-24 ENCOUNTER — Ambulatory Visit: Payer: Self-pay | Admitting: Oncology

## 2010-02-26 LAB — CBC WITH DIFFERENTIAL/PLATELET
BASO%: 0.4 % (ref 0.0–2.0)
Basophils Absolute: 0 10*3/uL (ref 0.0–0.1)
EOS%: 2.3 % (ref 0.0–7.0)
Eosinophils Absolute: 0.2 10*3/uL (ref 0.0–0.5)
HCT: 37.9 % — ABNORMAL LOW (ref 38.4–49.9)
HGB: 12.8 g/dL — ABNORMAL LOW (ref 13.0–17.1)
LYMPH%: 23.1 % (ref 14.0–49.0)
MCH: 32.7 pg (ref 27.2–33.4)
MCHC: 33.7 g/dL (ref 32.0–36.0)
MCV: 97.1 fL (ref 79.3–98.0)
MONO#: 0.4 10*3/uL (ref 0.1–0.9)
MONO%: 5.4 % (ref 0.0–14.0)
NEUT#: 4.6 10*3/uL (ref 1.5–6.5)
NEUT%: 68.8 % (ref 39.0–75.0)
Platelets: 165 10*3/uL (ref 140–400)
RBC: 3.9 10*6/uL — ABNORMAL LOW (ref 4.20–5.82)
RDW: 13.3 % (ref 11.0–14.6)
WBC: 6.6 10*3/uL (ref 4.0–10.3)
lymph#: 1.5 10*3/uL (ref 0.9–3.3)

## 2010-03-24 ENCOUNTER — Encounter (HOSPITAL_BASED_OUTPATIENT_CLINIC_OR_DEPARTMENT_OTHER): Payer: Medicare Other | Admitting: Oncology

## 2010-03-24 ENCOUNTER — Other Ambulatory Visit: Payer: Self-pay | Admitting: Oncology

## 2010-03-24 DIAGNOSIS — C61 Malignant neoplasm of prostate: Secondary | ICD-10-CM

## 2010-03-24 LAB — CBC WITH DIFFERENTIAL/PLATELET
BASO%: 0.3 % (ref 0.0–2.0)
Eosinophils Absolute: 0.1 10*3/uL (ref 0.0–0.5)
LYMPH%: 16.4 % (ref 14.0–49.0)
MCHC: 33.9 g/dL (ref 32.0–36.0)
MONO#: 0.4 10*3/uL (ref 0.1–0.9)
NEUT#: 5.6 10*3/uL (ref 1.5–6.5)
Platelets: 150 10*3/uL (ref 140–400)
RBC: 3.68 10*6/uL — ABNORMAL LOW (ref 4.20–5.82)
RDW: 13.5 % (ref 11.0–14.6)
WBC: 7.3 10*3/uL (ref 4.0–10.3)

## 2010-04-15 ENCOUNTER — Other Ambulatory Visit: Payer: Self-pay | Admitting: Geriatric Medicine

## 2010-04-16 ENCOUNTER — Ambulatory Visit
Admission: RE | Admit: 2010-04-16 | Discharge: 2010-04-16 | Disposition: A | Discharge status: Home / Self Care | Payer: Medicare Other | Source: Ambulatory Visit | Attending: Geriatric Medicine | Admitting: Geriatric Medicine

## 2010-04-16 MED ORDER — IOHEXOL 300 MG/ML  SOLN
100.0000 mL | Freq: Once | INTRAMUSCULAR | Status: AC | PRN
  Administered 2010-04-16: 100 mL via INTRAVENOUS

## 2010-04-21 ENCOUNTER — Encounter (HOSPITAL_BASED_OUTPATIENT_CLINIC_OR_DEPARTMENT_OTHER): Payer: Medicare Other | Admitting: Oncology

## 2010-04-21 ENCOUNTER — Other Ambulatory Visit: Payer: Self-pay | Admitting: Oncology

## 2010-04-21 DIAGNOSIS — C61 Malignant neoplasm of prostate: Secondary | ICD-10-CM

## 2010-04-21 DIAGNOSIS — D649 Anemia, unspecified: Secondary | ICD-10-CM

## 2010-04-21 DIAGNOSIS — D638 Anemia in other chronic diseases classified elsewhere: Secondary | ICD-10-CM

## 2010-04-21 DIAGNOSIS — N289 Disorder of kidney and ureter, unspecified: Secondary | ICD-10-CM

## 2010-04-21 LAB — CBC WITH DIFFERENTIAL/PLATELET
BASO%: 0.4 % (ref 0.0–2.0)
EOS%: 3 % (ref 0.0–7.0)
HCT: 31.7 % — ABNORMAL LOW (ref 38.4–49.9)
LYMPH%: 16.8 % (ref 14.0–49.0)
MCH: 33 pg (ref 27.2–33.4)
MCHC: 34.4 g/dL (ref 32.0–36.0)
MONO%: 4.5 % (ref 0.0–14.0)
NEUT%: 75.3 % — ABNORMAL HIGH (ref 39.0–75.0)
Platelets: 163 10*3/uL (ref 140–400)
lymph#: 1.1 10*3/uL (ref 0.9–3.3)

## 2010-05-03 LAB — URINALYSIS, ROUTINE W REFLEX MICROSCOPIC
Bilirubin Urine: NEGATIVE
Ketones, ur: NEGATIVE mg/dL
Nitrite: NEGATIVE
Protein, ur: NEGATIVE mg/dL
Urobilinogen, UA: 1 mg/dL (ref 0.0–1.0)

## 2010-05-03 LAB — DIFFERENTIAL
Eosinophils Relative: 12 % — ABNORMAL HIGH (ref 0–5)
Lymphocytes Relative: 17 % (ref 12–46)
Lymphs Abs: 1.3 10*3/uL (ref 0.7–4.0)
Neutro Abs: 5 10*3/uL (ref 1.7–7.7)

## 2010-05-03 LAB — CBC
HCT: 31.9 % — ABNORMAL LOW (ref 39.0–52.0)
Hemoglobin: 10.9 g/dL — ABNORMAL LOW (ref 13.0–17.0)
MCH: 32.8 pg (ref 26.0–34.0)
MCHC: 34.1 g/dL (ref 30.0–36.0)
MCHC: 34.3 g/dL (ref 30.0–36.0)
Platelets: 115 10*3/uL — ABNORMAL LOW (ref 150–400)
RBC: 3.34 MIL/uL — ABNORMAL LOW (ref 4.22–5.81)

## 2010-05-03 LAB — COMPREHENSIVE METABOLIC PANEL
AST: 27 U/L (ref 0–37)
Albumin: 3.7 g/dL (ref 3.5–5.2)
BUN: 23 mg/dL (ref 6–23)
Calcium: 9.4 mg/dL (ref 8.4–10.5)
Creatinine, Ser: 1.04 mg/dL (ref 0.4–1.5)
GFR calc Af Amer: >60 mL/min (ref >60)
GFR calc non Af Amer: >60 mL/min (ref >60)

## 2010-05-03 LAB — SURGICAL PCR SCREEN: Staphylococcus aureus: NEGATIVE

## 2010-05-03 LAB — BASIC METABOLIC PANEL
Calcium: 8.3 mg/dL — ABNORMAL LOW (ref 8.4–10.5)
GFR calc Af Amer: >60 mL/min (ref >60)
GFR calc non Af Amer: >60 mL/min (ref >60)
Glucose, Bld: 111 mg/dL — ABNORMAL HIGH (ref 70–99)
Potassium: 4.1 mEq/L (ref 3.5–5.1)
Sodium: 138 mEq/L (ref 135–145)

## 2010-05-04 LAB — DIFFERENTIAL
Eosinophils Absolute: 0.2 10*3/uL (ref 0.0–0.7)
Eosinophils Relative: 4 % (ref 0–5)
Lymphs Abs: 1.1 10*3/uL (ref 0.7–4.0)
Monocytes Relative: 5 % (ref 3–12)
Neutrophils Relative %: 72 % (ref 43–77)

## 2010-05-04 LAB — PROTIME-INR: INR: 1.06 (ref 0.00–1.49)

## 2010-05-04 LAB — URINALYSIS, ROUTINE W REFLEX MICROSCOPIC
Glucose, UA: NEGATIVE mg/dL
Hgb urine dipstick: NEGATIVE
Protein, ur: NEGATIVE mg/dL
pH: 5.5 (ref 5.0–8.0)

## 2010-05-04 LAB — COMPREHENSIVE METABOLIC PANEL
ALT: 16 U/L (ref 0–53)
AST: 25 U/L (ref 0–37)
Calcium: 9.1 mg/dL (ref 8.4–10.5)
Total Protein: 6.3 g/dL (ref 6.0–8.3)

## 2010-05-04 LAB — CBC
HCT: 34.3 % — ABNORMAL LOW (ref 39.0–52.0)
Hemoglobin: 12 g/dL — ABNORMAL LOW (ref 13.0–17.0)
MCV: 95.6 fL (ref 78.0–100.0)
RBC: 3.58 MIL/uL — ABNORMAL LOW (ref 4.22–5.81)
WBC: 5.8 10*3/uL (ref 4.0–10.5)

## 2010-05-04 LAB — SURGICAL PCR SCREEN
MRSA, PCR: NEGATIVE
Staphylococcus aureus: NEGATIVE

## 2010-05-19 ENCOUNTER — Encounter (HOSPITAL_BASED_OUTPATIENT_CLINIC_OR_DEPARTMENT_OTHER): Payer: Medicare Other | Admitting: Oncology

## 2010-05-19 ENCOUNTER — Other Ambulatory Visit: Payer: Self-pay | Admitting: Oncology

## 2010-05-19 DIAGNOSIS — C61 Malignant neoplasm of prostate: Secondary | ICD-10-CM

## 2010-05-19 LAB — CBC WITH DIFFERENTIAL/PLATELET
BASO%: 0.2 % (ref 0.0–2.0)
HCT: 35.7 % — ABNORMAL LOW (ref 38.4–49.9)
MCHC: 33.5 g/dL (ref 32.0–36.0)
MONO#: 0.3 10*3/uL (ref 0.1–0.9)
NEUT%: 74.2 % (ref 39.0–75.0)
RDW: 14 % (ref 11.0–14.6)
WBC: 6.9 10*3/uL (ref 4.0–10.3)
lymph#: 1.3 10*3/uL (ref 0.9–3.3)

## 2010-05-25 LAB — COMPREHENSIVE METABOLIC PANEL
ALT: 47 U/L (ref 0–53)
Alkaline Phosphatase: 127 U/L — ABNORMAL HIGH (ref 39–117)
BUN: 33 mg/dL — ABNORMAL HIGH (ref 6–23)
CO2: 31 mEq/L (ref 19–32)
Chloride: 103 mEq/L (ref 96–112)
GFR calc non Af Amer: >60 mL/min (ref >60)
Glucose, Bld: 98 mg/dL (ref 70–99)
Potassium: 4.6 mEq/L (ref 3.5–5.1)
Sodium: 138 mEq/L (ref 135–145)
Total Bilirubin: 0.6 mg/dL (ref 0.3–1.2)

## 2010-05-25 LAB — URINALYSIS, ROUTINE W REFLEX MICROSCOPIC
Bilirubin Urine: NEGATIVE
Glucose, UA: NEGATIVE mg/dL
Hgb urine dipstick: NEGATIVE
Ketones, ur: NEGATIVE mg/dL
Nitrite: NEGATIVE
Protein, ur: NEGATIVE mg/dL
Specific Gravity, Urine: 1.01 (ref 1.005–1.030)
Urobilinogen, UA: 0.2 mg/dL (ref 0.0–1.0)
pH: 5.5 (ref 5.0–8.0)

## 2010-05-25 LAB — DIFFERENTIAL
Basophils Absolute: 0 10*3/uL (ref 0.0–0.1)
Basophils Relative: 0 % (ref 0–1)
Eosinophils Absolute: 0.2 10*3/uL (ref 0.0–0.7)
Neutro Abs: 5.1 10*3/uL (ref 1.7–7.7)
Neutrophils Relative %: 75 % (ref 43–77)

## 2010-05-25 LAB — CBC
HCT: 34.7 % — ABNORMAL LOW (ref 39.0–52.0)
Hemoglobin: 11.4 g/dL — ABNORMAL LOW (ref 13.0–17.0)
RBC: 3.61 MIL/uL — ABNORMAL LOW (ref 4.22–5.81)
RDW: 13.7 % (ref 11.5–15.5)
WBC: 6.8 10*3/uL (ref 4.0–10.5)

## 2010-06-16 ENCOUNTER — Encounter (HOSPITAL_BASED_OUTPATIENT_CLINIC_OR_DEPARTMENT_OTHER): Payer: Medicare Other | Admitting: Oncology

## 2010-06-16 ENCOUNTER — Other Ambulatory Visit: Payer: Self-pay | Admitting: Oncology

## 2010-06-16 DIAGNOSIS — C61 Malignant neoplasm of prostate: Secondary | ICD-10-CM

## 2010-06-16 DIAGNOSIS — D649 Anemia, unspecified: Secondary | ICD-10-CM

## 2010-06-16 LAB — CBC WITH DIFFERENTIAL/PLATELET
BASO%: 0.2 % (ref 0.0–2.0)
Eosinophils Absolute: 0.2 10*3/uL (ref 0.0–0.5)
LYMPH%: 15.1 % (ref 14.0–49.0)
MCHC: 34.3 g/dL (ref 32.0–36.0)
MCV: 96.3 fL (ref 79.3–98.0)
MONO%: 3.3 % (ref 0.0–14.0)
NEUT#: 6.8 10*3/uL — ABNORMAL HIGH (ref 1.5–6.5)
Platelets: 165 10*3/uL (ref 140–400)
RBC: 3.36 10*6/uL — ABNORMAL LOW (ref 4.20–5.82)
RDW: 13.4 % (ref 11.0–14.6)
WBC: 8.6 10*3/uL (ref 4.0–10.3)

## 2010-06-20 ENCOUNTER — Emergency Department (HOSPITAL_COMMUNITY)
Admission: EM | Admit: 2010-06-20 | Discharge: 2010-06-20 | Disposition: A | Discharge status: Home / Self Care | Payer: Medicare Other | Attending: Emergency Medicine | Admitting: Emergency Medicine

## 2010-06-20 DIAGNOSIS — W540XXA Bitten by dog, initial encounter: Secondary | ICD-10-CM | POA: Insufficient documentation

## 2010-06-20 DIAGNOSIS — I498 Other specified cardiac arrhythmias: Secondary | ICD-10-CM | POA: Insufficient documentation

## 2010-06-20 DIAGNOSIS — I10 Essential (primary) hypertension: Secondary | ICD-10-CM | POA: Insufficient documentation

## 2010-06-20 DIAGNOSIS — C61 Malignant neoplasm of prostate: Secondary | ICD-10-CM | POA: Insufficient documentation

## 2010-06-20 DIAGNOSIS — Z950 Presence of cardiac pacemaker: Secondary | ICD-10-CM | POA: Insufficient documentation

## 2010-06-20 DIAGNOSIS — S61409A Unspecified open wound of unspecified hand, initial encounter: Secondary | ICD-10-CM | POA: Insufficient documentation

## 2010-06-24 ENCOUNTER — Ambulatory Visit (HOSPITAL_BASED_OUTPATIENT_CLINIC_OR_DEPARTMENT_OTHER)
Admission: RE | Admit: 2010-06-24 | Discharge: 2010-06-24 | Disposition: A | Discharge status: Home / Self Care | Payer: Medicare Other | Source: Ambulatory Visit | Attending: Orthopedic Surgery | Admitting: Orthopedic Surgery

## 2010-06-24 DIAGNOSIS — S61409A Unspecified open wound of unspecified hand, initial encounter: Secondary | ICD-10-CM | POA: Insufficient documentation

## 2010-06-24 DIAGNOSIS — Z79899 Other long term (current) drug therapy: Secondary | ICD-10-CM | POA: Insufficient documentation

## 2010-06-24 DIAGNOSIS — W540XXA Bitten by dog, initial encounter: Secondary | ICD-10-CM | POA: Insufficient documentation

## 2010-06-24 LAB — POCT I-STAT, CHEM 8
Creatinine, Ser: 1 mg/dL (ref 0.4–1.5)
HCT: 33 % — ABNORMAL LOW (ref 39.0–52.0)
Hemoglobin: 11.2 g/dL — ABNORMAL LOW (ref 13.0–17.0)
Potassium: 4.5 mEq/L (ref 3.5–5.1)
Sodium: 139 mEq/L (ref 135–145)
TCO2: 27 mmol/L (ref 0–100)

## 2010-06-26 LAB — WOUND CULTURE

## 2010-06-29 LAB — ANAEROBIC CULTURE

## 2010-06-30 NOTE — Op Note (Signed)
NAME:  [name], [name] NO.:  [id number]   MEDICAL RECORD NO.:  [id number]          PATIENT TYPE:  OIB   LOCATION:  3729                         FACILITY:  [facility]   PHYSICIAN:  Francisca December, M.D.  DATE OF BIRTH:  1924-10-14   DATE OF PROCEDURE:  10/11/2006  DATE OF DISCHARGE:                               OPERATIVE REPORT   PROCEDURES PERFORMED:  1. Insertion permanent dual chamber transvenous pacemaker.  2. Left subclavian venogram.   INDICATION:  [name] [name] is an 77 year old man with episodes of  presyncope.  A continuous ambulatory ECG monitor has revealed sinus  pauses up to 5 seconds with asystole.  He is symptomatic for these.  He  is brought to the catheterization laboratory at this time for insertion  of a dual chamber permanent transvenous pacemaker with a diagnosis of  sinus node dysfunction and symptomatic cardiac pauses.   PROCEDURE NOTE:  The patient is brought to the cardiac catheterization  laboratory in a fasting state.  The left prepectoral region was prepped  and draped in the usual sterile fashion.  Local anesthesia was obtained  with infiltration of 1% lidocaine.  A 6-7 cm incision was then made in  the deltopectoral groove and this was carried down by sharp dissection  and electrocautery to the prepectoral fascia.  There, a pocket was  formed inferiorly and medially using electrocautery and blunt  dissection.  The pocket was then packed with 1% kanamycin soaked gauze.  The left subclavian venogram had previously been performed with a  peripheral injection of 10 mL of Omnipaque.  A digital cineangiogram was  obtained and road mapped in AP projection to guide future left  subclavian puncture.  The venogram did demonstrate the vein to be widely  patent and coursing in a normal fashion over the anterior surface of the  first rib and beneath the middle third of the clavicle.  There was no  evidence for persistence of the left superior vena  cava.   The left subclavian vein was then punctured twice using a micropuncture  needle.  I was unable to enter the vein using the standard 18-gauge thin  wall needle.  Two 0.035 tight J guidewires were placed.  Over the  initial guidewire, a 7-French tearaway sheath and dilator were advanced.  The dilator and wire were removed and the ventricular lead was advanced  to the level of the right atrium.  The sheath was then torn away.  Using  standard technique and fluoroscopic landmarks, the lead was then  manipulated in the right ventricular apex.  An attempt was made to place  it on the septum but this was unsuccessful.  Adequate pacing parameters  were not obtained in this position.  This was an active fixation lead  and the screw was advanced as appropriate.  Adequate pacing parameters  were obtained as will be noted below.  The lead was tested for  diaphragmatic pacing at 10 volts and none was found.  The lead was then  sutured into place using three separate 0 silk ligatures.  Over the  remaining guidewire, a second  7-French tearaway sheath and dilator were  advanced, the dilator and wire were removed, and the atrial lead was  advanced to the level of the right atrium and the sheath was torn away.  Using standard technique and fluoroscopic landmarks, the lead was  manipulated into the right atrial appendage.  There, excellent pacing  parameters were obtained as will be noted below.  This was an active  fixation lead, as well, and the screw was advanced as appropriate.  It  was tested for diaphragmatic pacing at 10 volts and none was found.  The  lead was then sutured into place using three separate 0 silk ligatures.  The kanamycin soaked gauze was then removed the pocket.  The pocket was  copiously irrigated using 1% kanamycin solution.  The leads were then  attached to the pacing generator, carefully identifying each by its  serial number, and placing each into the appropriate  receptacle.  Each  lead was tightened into place and tested for security.  The leads were  then wound beneath the pacing generator and the generator was placed in  the pocket.  An 0 silk anchoring suture to the pectoralis muscle was  then applied.  The pocket was then closed using 2-0 Vicryl in a running  fashion in the subcutaneous layer.  The skin was approximated using 4-0  Vicryl in a running subcuticular fashion.  Steri-Strips and a sterile  dressing were applied.  The patient was transported to the recovery area  in stable condition.   EQUIPMENT DATA:  The pacing generator is a Medtronic Adapta-L, model  ADDRL1, serial number [id number].  The atrial lead is a Medtronic model  number of [id number], serial number [id number].  The ventricular lead is a  Medtronic model number [id number], serial number [id number].   PACING DATA:  The ventricular lead detected a 5.1 mV R-wave.  The pacing  threshold was 0.8 volts at 0.5 milliseconds pulse width.  The impedance  was 938 ohms resulting in a current at capture threshold of 1.0 MA.  The  atrial lead detected a 2.4 mV P-wave.  The pacing threshold 0.5 volts at  0.5 milliseconds pulse width.  The impedance was 489 ohms resulting in a  current at capture threshold of 1.5 MA.      Francisca December, M.D.  Electronically Signed     JHE/MEDQ  D:  10/11/2006  T:  10/11/2006  Job:  [id number]   cc:   Hal T. Stoneking, M.D.

## 2010-06-30 NOTE — Op Note (Signed)
NAME:  [name] [name] NO.:  [id number]   MEDICAL RECORD NO.:  [id number]          PATIENT TYPE:  AMB   LOCATION:  SDS                          FACILITY:  [facility]   PHYSICIAN:  Lubertha Basque. Dalldorf, M.D.DATE OF BIRTH:  [birth year]   DATE OF PROCEDURE:  11/07/2007  DATE OF DISCHARGE:  11/07/2007                               OPERATIVE REPORT   PREOPERATIVE DIAGNOSES:  1. Right shoulder impingement.  2. Right shoulder rotator cuff tear.   POSTOPERATIVE DIAGNOSES:  1. Right shoulder impingement.  2. Right shoulder rotator cuff tear.   PROCEDURE:  1. Right shoulder arthroscopic acromioplasty.  2. Right shoulder arthroscopic partial claviculectomy.  3. Right shoulder arthroscopic debridement.   ANESTHESIA:  General and a block.   ATTENDING SURGEON:  Lubertha Basque. Jerl Santos, MD   ASSISTANT:  Lindwood Qua, PA   INDICATION FOR PROCEDURE:  The patient is a very active 77 year old male  with a long history of bilateral shoulder pain.  This has persisted  despite oral anti-inflammatories, therapy program, and subacromial  injection.  By CT, he has small rotator cuff tears on both sides.  He  has pain which limits his ability to use his arm and to rest.  He is  offered an arthroscopy in the most painful side which is the right.  Informed operative consent was obtained after discussion of possible  complications of reaction to anesthesia and infection.   SUMMARY/FINDINGS/PROCEDURE:  Under general anesthesia and a block in the  main operating room, an arthroscopy of the right shoulder was performed.  Glenohumeral joint showed some mild degenerative changes across the  humeral head.  The biceps tendon appeared normal throughout.  The  rotator cuff had a significant partial-thickness tear seen from below  and a significant debridement was done.  This was about 75% thickness of  the cuff in one small location.  In the subacromial space, he had  impingement related to the shape  of his acromion and AC joint and an  acromioplasty was done along with partial claviculectomy.  The rotator  cuff appeared completely benign on the bursal aspect.   DESCRIPTION OF PROCEDURE:  The patient was taken to the operating suite  where general anesthetic was applied without difficulty.  He was also  given a block in the preanesthesia area.  He was positioned in beach-  chair position and prepped, draped in a normal sterile fashion.  After  the administration of IV Kefzol, an arthroscopy of right shoulder was  performed through total of three portals.  Findings were as noted above.  Procedure consisted of the debridement of partial-thickness tear  followed by an acromioplasty and AC resection.  Bony work was done with  a bur in the lateral position followed by transfer of the bur to the  posterior position.  The shoulder was thoroughly irrigated.  The cuff  was examined from above.  After thorough bursectomy, no full-thickness  tear was seen.  We felt that the best means of treatment was for the  decompression with debridement without a formal repair.  The shoulder  was thoroughly irrigated followed by reapproximation  of portals loosely  with nylon.  Adaptic was applied followed by dry gauze and tape.  Estimated blood loss and fluids obtained from anesthesia records.   DISPOSITION:  The patient was extubated in the operating room and taken  to recovery room in stable addition.  He would potentially go home the  same day depending on approval through anesthesia.  If he does go home,  I will certainly contact him by phone tonight.      Lubertha Basque Jerl Santos, M.D.  Electronically Signed     PGD/MEDQ  D:  11/07/2007  T:  11/08/2007  Job:  [id number]

## 2010-06-30 NOTE — Op Note (Signed)
NAME:  [name] [name], [name] [name] NO.:  [id number]   MEDICAL RECORD NO.:  [id number]          PATIENT TYPE:  AMB   LOCATION:  SDS                          FACILITY:  [facility]   PHYSICIAN:  Sharlet Salina T. Hoxworth, M.D.DATE OF BIRTH:  03-12-1924   DATE OF PROCEDURE:  08/05/2008  DATE OF DISCHARGE:  08/05/2008                               OPERATIVE REPORT   PREOPERATIVE DIAGNOSIS:  Supraumbilical hernia.   POSTOPERATIVE DIAGNOSIS:  Supraumbilical hernia.   SURGICAL PROCEDURES:  Repair of supraumbilical hernia with mesh.   SURGEON:  Lorne Skeens. Hoxworth, MD   ANESTHESIA:  General.   BRIEF HISTORY:  Mr. Mish is an 77 year old male who presents with  an enlarging intermittently painful lump just above his umbilicus.  On  exam, has a palpable proximally 2-cm hernia 2 cm above the umbilicus in  the midline.  I have recommended repair under general anesthesia with  mesh.  Nature of procedure, indications, risks of bleeding, infection,  anesthetic complications, recurrence were discussed and understood.  He  is now brought to the operating room for this procedure.   DESCRIPTION OF OPERATION:  The patient was brought to the operating  room, placed in supine position on the table, and general endotracheal  anesthesia was induced.  The head was widely sterilely prepped and  draped.  He was given preoperative IV antibiotics.  PAS were placed.  Correct patient and procedure were verified.  I made an incision in the  midline just above the umbilicus.  Dissection was carried down through  subcutaneous tissue with cautery.  Herniated preperitoneal fat was  encountered.  This was dissected away from surrounding subcu down to the  level of the fascia.  There are actually 2 adjacent defects with a small  bridge of fascia between the two, the lower measuring about a centimeter  and the upper less than a centimeter.  The preperitoneal fat in each  case, after being completely dissected down  to the fascia and mobilized  was then excised with cautery.  The peritoneum was entered under direct  vision.  I then opened through the small bridge of fascia to create a  single defect, it was about a centimeter and half in diameter.  Palpating through the defect in the peritoneum, there were no  surrounding adhesions, and the surrounding fascia felt intact in all  directions.  I elected to use a large 6-cm Ethicon ventral patch.  This  was folded, oriented, then deployed in the peritoneal cavity and  deployed out in all directions.  I carefully felt to make sure it was  open against the fascia in all directions.  This appeared to provide  nice broad coverage.  The superior and inferior fascial edges were then  sutured to the mesh tags with interrupted 2-0 Prolene, and the tags were  trimmed and removed.  The soft tissue was infiltrated with Marcaine.  Subcu was closed with running 3-0 Vicryl and the skin with subcuticular  4-0 Monocryl and Dermabond.  Sponge, needle, instrument counts were  correct.  The patient taken to recovery in good condition.  Lorne Skeens. Hoxworth, M.D.  Electronically Signed     BTH/MEDQ  D:  08/05/2008  T:  08/05/2008  Job:  [id number]

## 2010-06-30 NOTE — Assessment & Plan Note (Signed)
[facility]                             PULMONARY OFFICE NOTE   [name] [name], [name] [name]                    MRN:          [id number]  DATE:10/24/2006                            DOB:          [birth year]    SUBJECTIVE:  I saw [name] [name] in followup today for his pneumothorax.   He says that he feels reasonably good.  He still has some stiffness in  his left shoulder, but he denies any significant chest pain or worsening  dyspnea.  He is not having any cough or sputum production.  He says that  he has not had any discharge from his chest tube site insertion.   MEDICATIONS:  His medication list was reviewed.   PHYSICAL EXAMINATION:  VITAL SIGNS:  Weight 205 pounds, temperature 97.8  degrees, blood pressure 110/58, heart rate 69, oxygen saturation 97% on  room air.  HEART:  S1 and S2.  CHEST:  No wheezing or rales.  The chest tube site in the left axillary  area showed good healing of his wound without any erythema or exudate.  There was no pain.   A chest x-ray in my office today showed resolution of his pneumothorax.  There was a faint haziness over the left heart border which was slightly  more prominent than from the time of his discharge.   IMPRESSION/PLAN:  1. Pneumothorax, which has resolved:  I have removed his sutures in my      office today and I placed Steri-Strips on this.  I have advised him      to keep the area clean and dry and allow the Steri-Strips to fall      off on their own.  2. Haziness over his left heart border on his chest x-ray:  I have      asked him to come back and see me in approximately three weeks, at      which time I will repeat his chest x-ray to determine if there is      persistence of this haziness to his left heart border.  If there      is, we may need to have him undergo further imaging studies with a      CT scan of his chest, to further evaluate this.     Coralyn Helling, MD  Electronically Signed    VS/MedQ  DD: 10/24/2006  DT: 10/25/2006  Job #: [id number]   cc:   Francisca December, M.D.

## 2010-06-30 NOTE — Assessment & Plan Note (Signed)
[facility]                             PULMONARY OFFICE NOTE   GARDINER, ESPANA                    MRN:          [id number]  DATE:11/15/2006                            DOB:          09-11-1924    I saw [name] in followup today for his pneumothorax and haziness  on the left heart border of his chest x-ray.   He says that symptomatically he is feeling quite well, and he is not  having pain on his left side where he had his chest tube.  His  medication list was reviewed.   PHYSICAL EXAMINATION:  He is 193 pounds, temperature is 98.2, blood  pressure is 110/60, heart rate is 88.  Oxygen saturation is 96% on room  air.  HEENT:  There is no lymphadenopathy, no thyromegaly.  HEART:  S1, S2.  CHEST:  There is no wheezing or rales.  The chest tube sites appear to  be well healed.  ABDOMEN:  Soft, nontender.  EXTREMITIES:  No edema.   Chest x-ray in my office today showed improvement in the haziness of the  left heart border.   IMPRESSION:  1. Pneumothorax which is essentially resolved.  2. Abnormal chest x-ray.  Again, this appears to have improved      considerably, and I do not think that he would need to have any      further radiographic followup on this unless he were to develop      worsening of his symptoms.  3. I have advised him that he can follow up with me on an as-needed      basis.     Coralyn Helling, MD  Electronically Signed    VS/MedQ  DD: 11/16/2006  DT: 11/17/2006  Job #: [id number]   cc:   Francisca December, M.D.

## 2010-06-30 NOTE — Discharge Summary (Signed)
NAME:  [name] [name], SWATEK NO.:  [id number]   MEDICAL RECORD NO.:  [id number]          PATIENT TYPE:  INP   LOCATION:  3730                         FACILITY:  [facility]   PHYSICIAN:  Francisca December, M.D.  DATE OF BIRTH:  10-08-1924   DATE OF ADMISSION:  10/11/2006  DATE OF DISCHARGE:  10/18/2006                               DISCHARGE SUMMARY   DISCHARGE DIAGNOSES:  1. Sinus node dysfunction status post permanent pacemaker, Medtronic.  2. Iatrogenic pneumothorax status post chest tube.  3. Fever of unknown origin, resolved, felt to be secondary to      atelectasis.  4. Hypertension.  5. Hiatal hernia with stricture dilated August of 1999.  6. Chronic obstructive pulmonary disease.  7. Sleep apnea.  8. Previous history of anemia.  9. Prostate carcinoma.   HOSPITAL COURSE:  Mr. Grieser is an 77 year old patient who initially  saw Dr. Amil Amen in the office on September 26, 2006 and had been diagnosed  with sinus node dysfunction with episodes of sinus rest lasting up to  eight seconds with symptoms.  He was admitted to the hospital for  permanent pacemaker implantation on October 11, 2006.  A dual-chamber  Medtronic device was used (AAIR-DDDR, 60-130 beats per minute).  Postoperatively, the patient was noted to have loss of capture of the  atrial lead and the patient was then taken back to the lab and the  atrial lead was repositioned.  The patient was also noted to have an  iatrogenic pneumothorax which ultimately required a left chest tube  which remained in place on October 14, 2006.   Patient did have fever of unknown origin during the hospitalization and  this was felt to be secondary to atelectasis.   Lab studies during the patient's hospital stay include white count of  6.1, hemoglobin 9.7, hematocrit 27.6, platelets 146, sodium 142,  potassium 4.6, BUN 19, creatinine 1.01.  LFTs normal.  Blood cultures no  growth x5 days.   Chest x-ray showed bibasilar  atelectasis with a small left pleural  effusion.   By October 18, 2006, the patient was felt to be ready for discharge to  home in stable condition.  Because of his weakened condition, we did  arrange for him to have home health nursing and physical therapy short  term.  He was noted to have a hemoglobin drop from 12.2 down to 10.  He  will need an anemia workup as an outpatient.  There was no evidence of  acute blood loss.   Patient has a followup appointment with Dr. Craige Cotta of [facility] on October 21, 2006 at 1:30 p.m.  The patient is to report at  1:00 p.m. for a chest x-ray prior to visit.   Please note that the patient did have an acute flare of gout during the  hospitalization and was seen by Dr. Lajoyce Corners of the orthopedic service.  Patient is to follow up with him if pain continues or recurs.   DISCHARGE MEDICATIONS:  As per the home medicine reconciliation form:  1. Morphine tablets as needed as prior to admission.  2. Vicodin p.r.n.  3. Lisinopril 40 mg daily.  4. Omeprazole 20 mg daily.  5. Lasix 80 mg daily.  6. Terazosin 10 mg daily.  7. Finasteride 5 mg daily.  8. Gabapentin 300 mg daily.  9. Folic acid daily.  10.Baby aspirin daily.   Patient had been on Lasix as above, however he is only to restart the  Lasix 80 mg daily once his oral intake has returned to normal.   Follow up as listed above.  Follow up with Dr. Amil Amen on October 24, 2006 at 9:40 a.m.  Patient is discharged to home in stable and improved  condition.      Guy Franco, P.A.      Francisca December, M.D.  Electronically Signed    LB/MEDQ  D:  11/17/2006  T:  11/17/2006  Job:  [id number]

## 2010-06-30 NOTE — Op Note (Signed)
NAME:  [name] [name], [name] [name] NO.:  [id number]   MEDICAL RECORD NO.:  [id number]          PATIENT TYPE:  OIB   LOCATION:  3730                         FACILITY:  [facility]   PHYSICIAN:  Francisca December, M.D.  DATE OF BIRTH:  [birth year]   DATE OF PROCEDURE:  10/12/2006  DATE OF DISCHARGE:                               OPERATIVE REPORT   PROCEDURE PERFORMED:  Atrial lead revision.   SURGEON:  Francisca December, M.D.   INDICATION:  [name] is 24 hours status post insertion of  permanent dual-chamber pacemaker for sinus node dysfunction/sinus pause  with asystole.  He developed inappropriate pacemaker-mediated  tachycardia yesterday afternoon, about 2 hours after the procedure.  The  device was reinterrogated and the atrial lead was found to be in a non-  capture status.  Chest x-ray showed retraction of the lead, although was  still embedded in the atrial myocardium.  It is an active fixation lead.  He is brought to the catheterization laboratory at this time for  repositioning.   PROCEDURE NOTE:  The patient was brought to cardiac catheterization  laboratory in the fasting state.  The left prepectoral region was  prepped and draped in the usual sterile fashion.  Local anesthesia was  obtained with the infiltration of 1% lidocaine with epinephrine  throughout the left prepectoral region over the previous implant site.  The wound was then opened using a scalpel and the sutures taken down.  The pacemaker was delivered from the pocket and the atrial lead  disconnected.  Under fluoroscopic observation, the lead screw was  retracted and the lead was repositioned more toward the lateral wall of  the right atrium.  The screw was re-advanced and the lead was tested for  security.  The J stylet was removed and adequate pacing parameters were  obtained, as will be noted below.  The lead was then sutured into place  using 3 separate 0 silk ligatures.  The pocket was then  copiously  irrigated using 1% kanamycin solution.  The lead was connected to the  pacing generator, carefully testing for security.  The leads were then  wound beneath the pacing generator and the generator was placed in the  pocket.  An 0 silk anchoring suture was applied to the pectoralis  muscle.  The wound was then closed using 2-0 Vicryl in a running fashion  for the subcutaneous layer.  The skin was approximated using 4-0 Vicryl  in a running subcuticular fashion.  Steri-Strips and a sterile dressing  were applied and the patient was transported to the recovery area in  stable condition.   PACING DATA:  The atrial lead detected a 4.0-mV R wave.  The pacing  threshold was 0.8 volts at 0.5-millisecond pulse width.  The impedance  was 562 ohms, resulting in a current at capture threshold of 1.6 mA.      Francisca December, M.D.  Electronically Signed     JHE/MEDQ  D:  10/12/2006  T:  10/13/2006  Job:  [id number]

## 2010-07-03 NOTE — H&P (Signed)
[name]. [facility]  Patient:    [name] [name], [name] [name]                      MRN: [id number] Adm. Date:  08/28/00 Attending:  Theressa Millard, M.D.                         History and Physical  HISTORY OF PRESENT ILLNESS:  Mr. Mabie is a very nice, 77 year old, white male, admitted with a positive CK-MB and chest discomfort.  He was in his usual state of health until this morning.  He has a history of hypertension on Altace (? milligrams) q.d. and hydrochlorothiazide 12.5 mg q.d.  He also takes doxazosin (? milligrams) q.d. and Prevacid (? 30 mg) q.d. for prostate enlargement and GERD.  He awoke at about 1 a.m. today with a coughing spell.  He was nauseated and had some midsternal chest discomfort. He took some cough syrup and Maalox without much relief.  His wife checked his blood pressure and pulse and found it initially to be 200/90 with a somewhat irregular pulse.  He did not feel very good, but he is not sure why he felt so bad and cannot describe it further.  The midsternal chest pain lasted about an hour.  He had tapered off gradually.  At the present time, he feels fine.  PAST SURGICAL HISTORY:  Herniorrhaphy x 2.  Total knee replacement.  PAST MEDICAL HISTORY:   Hypertension.  BPH.  GERD.  MEDICATIONS:  See above, also one baby aspirin daily.  ALLERGIES:  No known drug allergies.  SOCIAL HISTORY:  Cigarettes:  None.  Alcohol:  One and a half glasses of wine daily.  He is retired and was self-employed in [employer].  He also worked for [employer] for 20 years.  He is married.  FAMILY HISTORY:  His father died at age 67 of coronary artery disease.  His mother died at about age 73 of colon cancer.  A sister has had breast cancer and heart disease.  REVIEW OF SYSTEMS:  All other systems are negative.  PHYSICAL EXAMINATION:  Well-developed, well-nourished, and in no distress.  VITAL SIGNS:  Blood pressure 120/65, pulse 54, respiratory  rate 28.  HEENT:  The eyes have pupils which are round and reactive to light. Extraocular movements are intact.  The funduscopic exam is normal.  The ears are normal.  The nose and throat are unremarkable.  NECK:  Supple.  The thyroid is not enlarged or tender.  CHEST:  Clear to auscultation and percussion.  CARDIAC:  Regular rate and rhythm with a normal S1 and S2 without an S3, S4, murmur, rub, or click.  ABDOMEN:  Soft and nontender with normal bowel sounds without hepatosplenomegaly or mass.  EXTREMITIES:  Without clubbing, cyanosis, or edema.  NEUROLOGIC:  Alert and oriented x 3.  Cranial nerves are intact.  Motor is 5/5.  LABORATORY DATA:  K 3.4, otherwise normal BMET.  CK 347, CK-MB 11.9, troponin negative.  The EKG shows normal sinus rhythm with no acute changes.  IMPRESSION: 1. Chest pain associated with nausea, cough, and general sense of illness. 2. Positive CK-MB with negative troponin. 3. Hypertension. 4. Hypokalemia. 5. Benign prostatic hypertrophy.  This is a somewhat unusual story.  It all started with coughing, but the chest pain and nausea are consistent with ischemia.  His MB was positive and troponin was negative and that is a curious finding.  Will admit the patient and treat with Lovenox, aspirin, beta blockers, and p.r.n. nitrates.  Will ask cardiology to see.  Will obtain serial enzymes.  Will need to clarify drug dosages. DD:  08/28/00 TD:  08/28/00 Job: [id number]

## 2010-07-03 NOTE — Procedures (Signed)
[place]. [facility]  Patient:    [name] [name], [name] [name]                   MRN: [id number] Proc. Date: 03/11/00 Attending:  Verlin Grills, M.D. CC:         Hal T. Stoneking, M.D.   Procedure Report  REFERRING PHYSICIAN:  Hal T. Stoneking, M.D.  PROCEDURE:  Esophagogastroduodenoscopy, Savary esophageal dilation, and colonoscopy.  PROCEDURE INDICATION:  Mr. Tino Ronan. 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  He is also due for surveillance colonoscopy with polypectomy to prevent colon cancer.  His mother was diagnosed with colon cancer in her 65s.  I discussed with Mr. Wuertz the complications associated with esophagogastroduodenoscopy, colonoscopy, and polypectomy, including intestinal bleeding and intestinal perforation.  Mr. Kuhner has signed the operative permit.  ENDOSCOPIST:  Verlin Grills, M.D.  PREMEDICATION:  Fentanyl 50 mcg, Versed 5 mg.  ENDOSCOPE:  Olympus gastroscope, 15 mm Savary dilator, and pediatric colonoscope.  DESCRIPTION OF PROCEDURE:  Esophagogastroduodenoscopy with Savary esophageal dilation.  After obtaining informed consent, the patient was placed in the left lateral decubitus position.  I administered intravenous fentanyl and intravenous Versed to achieve conscious sedation for the procedure.  The patients blood pressure, oxygen saturation, and cardiac rhythm were monitored throughout the procedure and documented in the medical record.  The Olympus gastroscope was passed through the posterior hypopharynx, into the proximal esophagus without difficulty.  The hypopharynx, larynx appeared normal.  I did not visualize the vocal cords.  Esophagoscopy:  The proximal and midsegments of the esophagus appeared  normal. There is a benign-appearing Schatzkis ring at the esophagogastric junction. There is no endoscopic evidence for the presence of Barretts esophagus, erosive esophagitis, esophageal ulcers.  The Schatzkis ring appears to be nonobstructing.  Gastroscopy:  There is a moderate-sized hiatal hernia with normal overlying gastric mucosa.  Retroflexed view of the gastric cavity and fundus was normal. The gastric body, antrum, and pylorus appeared normal.  Duodenoscopy:  The duodenal bulb and descending duodenum appeared normal.  Savary esophageal dilation:  The Savary dilator wire was passed through the endoscope and under endoscopic guidance, the tip of the guidewire was advanced through the distal gastric antrum.  Over the guidewire, the 15 mm Savary dilator passed without resistance.  Repeat esophagogastroscopy revealed no mucosal dilation of the nonobstructing Schatzkis ring at the esophagogastric junction and no evidence of gastric trauma due to the guidewire.  ASSESSMENT:  Gastroesophageal reflux disease associated with a benign-appearing Schatzkis ring at the esophagogastric junction and moderate-sized hiatal hernia.  DESCRIPTION OF PROCEDURE:  Proctocolonoscopy to the cecum:  Anal inspection was normal.  Digital rectal exam revealed an enlarged but non-nodular prostate.  The Olympus pediatric video colonoscope was introduced into the rectum and under direct vision advanced to the cecum, identified by a normal-appearing ileocecal valve.  Colonic preparation for the exam today was excellent.  Rectum normal.  Sigmoid colon and descending colon normal.  Splenic flexure normal.  Transverse colon normal.  Hepatic flexure normal.  Ascending colon normal.  Cecum and ileocecal valve normal.  ASSESSMENT:  Normal surveillance proctocolonoscopy to the cecum.  No endoscopic evidence for the presence of colorectal neoplasia.  RECOMMENDATIONS:  Repeat surveillance colonoscopy in  approximately 10 years.DD: 03/11/00 TD:  03/11/00 Job: [id number]

## 2010-07-03 NOTE — Discharge Summary (Signed)
[place]. [facility]  Patient:    [name] [name], [name] [name]                    MRN: [id number] Adm. Date:  [id number] Disc. Date: [id number] Attending:  Darnelle Bos                           Discharge Summary  ADMISSION DIAGNOSIS:  Chest pain.  DISCHARGE DIAGNOSES: 1. Chest pain, question etiology, myocardial infarction ruled out.  No    evidence of ischemia on stress Cardiolite. 2. Elevated CK-MB enzymes with negative troponin. 3. Hypertension. 4. Hypokalemia. 5. Benign prostatic hypertrophy.  HISTORY OF PRESENT ILLNESS:  Please see the admission history and physical examination for details.  HOSPITAL COURSE:  The patient was admitted on the basis of serial enzymes and EKGs, and myocardial infarction was ruled out.  He was seen in consultation by cardiology, who performed a stress Cardiolite without any evidence of ischemia.  He was, therefore, discharged in improved condition.  He did have a slightly low potassium, which was replaced during this hospitalization, but he was otherwise kept on his usual medications.  ACTIVITY:  As tolerated.  DISCHARGE MEDICATIONS: 1. Aspirin 81 mg daily. 2. Altace daily. 3. ______ daily. 4. Hydrochlorothiazide 12.5 mg daily. 5. Prevacid daily (he will use the same dose as he had prior to admission).  FOLLOW-UP:  He will make an appointment to see Dr. Pete Glatter in approximately one month.  DIET:  No added salt. DD:  09/01/00 TD:  09/03/00 Job: [id number]

## 2010-07-03 NOTE — Op Note (Signed)
[facility]  Patient:    [name] [name], [name] [name]                    MRN: [id number] Adm. Date:  [id number] Disc. Date: [id number] Attending:  Glenna Fellows Tappan                           Operative Report  PREOPERATIVE DIAGNOSIS:  Recurrent left inguinal hernia.  POSTOPERATIVE DIAGNOSIS:  Recurrent left inguinal hernia.  SURGICAL PROCEDURE:  Laparoscopic repair of recurrent left inguinal hernia.  SURGEON:  Lorne Skeens. Hoxworth, M.D.  ANESTHESIA:  General.  BRIEF HISTORY:  [name] [name] is a 77 year old white male who approximately five years ago underwent repair of an indirect sliding left inguinal hernia without mesh.  He has now developed a symptomatic recurrence confirmed by physical exam in the left inguinal area and desires repair.  Options, including open and laparoscopic repair, have been discussed and understood, and we elected to proceed with laparoscopic repair.  The nature of the procedure, its indications, and risks of bleeding, infection, and recurrence, were discussed and understood preoperatively.  He is now brought to the operating room for the surgical procedure.  DESCRIPTION OF PROCEDURE:  The patient was brought to the operating room and placed in the supine position on the operating table, and general endotracheal anesthesia was induced.  Antibiotics were given preoperatively.  PAS were in place.  A Foley catheter was sterilely placed.  The abdomen and groin were sterilely prepped and draped.  A 1 cm incision was made at the umbilicus, and the anterior rectus fascia exposed.  It was incised transversely for 1 cm, and the preperitoneal space entered and the balloon dissector passed easily with its tip to the pubic symphysis.  The balloon was then inflated with good bilateral deployment.  It was left in place for two minutes and deflated, and the structural balloon and CO2 pressure applied.  Video laparoscopy showed a fairly good  bilateral dissection, although the epigastric vessels had been dissected posteriorly on the left.  I did not dissect the right side due to a normal exam and no symptoms here.  Under direct vision, two 5 mm trocars were placed in the midline near the umbilicus and just to the right of midline. Initially the epigastric vessels were dissected back anteriorly.  The pubic symphysis was identified and the pubic ramus clear down to the iliac vessels were identified and protected.  The peritoneum was identified laterally, and there had been good dissection of the peritoneum posteriorly, and it was dissected further out to the iliac crest.  Working back medially, the edge of the peritoneum was traced to the cord structures.  Cord structures were dissected free at the internal ring.  There was some scarring in this area. The peritoneum did not extend up to the internal ring, but there was a good-sized piece of preperitoneal fat that was through the internal ring that was dissected away from the cord structures and reduced posteriorly.  There was also a small direct defect in the floor with suture material apparent here.  After thorough dissection, a piece of Prolene mesh was trimmed to 4 x 6 inches and tapered slightly laterally and was placed in the preperitoneal space and unfurled.  It was positioned and tacked initially to the pubic symphysis, and then the medial edge was tacked inferior to superior with good coverage of the direct space.  The mesh was unfurled laterally  with good coverage of the internal ring.  The superior border of the mesh was then tacked working laterally, avoiding the epigastric vessels, with the most lateral tacks being placed out toward the iliac crest.  Tacks were also placed along the Coopers ligament down to the iliac vessels.  The herniated area of preperitoneal fat was then brought posterior to the mesh and held in place here with one tack.  There appeared to be very  good coverage of the direct and indirect spaces.  Trocars were removed under direct vision, and all CO2 evacuated.  The fascial defect was closed with 0 Vicryl.  The skin was closed with subcuticular 4-0 Vicryl and Steri-Strips.  Sponge, instrument, and needle counts were correct.  Dry sterile dressings were applied and the patient taken to recovery in good condition. DD:  07/24/99 TD:  07/29/99 Job: [id number]

## 2010-07-03 NOTE — Consult Note (Signed)
[place]. [facility]  Patient:    [name] [name], [name] [name]                   MRN: [id number] Proc. Date: 08/28/00 Adm. Date:  08/28/00 Attending:  Francisca December, M.D. CC:         Theressa Millard, M.D.  Hal T. Stoneking, M.D.   Consultation Report  REASON FOR CONSULTATION:      Chest pain.  PROBLEMS: 1. Atypical angina ("unstable pattern"). 2. Long history of gastroesophageal reflux disease. 3. Nondiagnostically elevated CK-MB. 4. Hypertension. 5. Benign prostatic hypertrophy.  RECOMMENDATIONS: 1. Repeat enzymes and ECG as planned. 2. If above negative, plan for graded exercise test with Cardiolite    myocardial perfusion imaging. 3. If above positive, would recommend cardiac catheterization.  FINDINGS:  Mr. Vernard Gram is a 77 year old man without prior history of cardiac disease and who can exert himself rather vigorously without symptoms. Awoke around 1 a.m. on August 28, 2000, with nausea and probably developed a coughing spell.  This then progressed and included a dull, aching mid substernal chest pain which then became somewhat burning in nature.  He denies any associated diaphoresis or dyspnea.  The discomfort lasted for about an hour to 1-1/2 hours, and he presented himself to [facility] Emergency Room, at which time the discomfort had resolved.  There, an ECG was obtained and was unremarkable.  Initial CK is 347 with an MB of 11.9.  Relative index slightly elevated.  At the time of my evaluation, approximately nine hours later, he is without symptoms whatsoever.  He has never had chest discomfort such as this in the past.  He has a long history of GERD.  He did not eat particularly late last evening.  It had been about four hours since he had eaten when he went to bed.  PAST MEDICAL HISTORY:  As noted above.  SOCIAL HISTORY:  Was self-employed, now retired.  He was accompanied to the hospital this morning by his wife.  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  He drinks one-half glass of wine or so daily.  No tobacco use for the past 25 years.  FAMILY HISTORY:  His father died at age 9, had coronary disease.  Mother died at age 32, colon cancer.  Sister is alive, has breast cancer and what sounds like atrial fibrillation.  PAST SURGICAL HISTORY: 1. Hearing repair x 2. 2. Total knee replacement.  MEDICATIONS: 1. Protonix 40 mg p.o. q.d. 2. Hydrochlorothiazide 12.5 mg p.o. q.d. 3. Altace 10 mg p.o. q.d. 4. Doxazosin 2 mg p.o. q.d.  DRUG ALLERGIES:  None.  REVIEW OF SYSTEMS:  He does wear glasses and upper and lower dentures.  Normal hearing.  No pulmonary problems that he is aware of.  Denies any difficulty with urination now that he has been on doxazosin.  Other systems are negative.  PHYSICAL EXAMINATION:  VITAL SIGNS:  Blood pressure 120/65, pulse 60 and regular, respiratory rate 18, afebrile.  GENERAL:  Well-appearing 77 year old man.  Alert and oriented, in no distress.  HEENT:  Head is atraumatic and normocephalic.  Pupils are equal, round, and reactive to light and accommodation.  Extraocular movements intact.  Sclerae are anicteric.  Oral mucosa is pink and moist.  The tongue is not coated.  NECK:  Supple.  Without thyromegaly or masses.  Carotid upstrokes are normal. There is no bruit.  There is no jugular venous distention.  CHEST:  Clear, with adequate excursion bilaterally.  There  was one inspiratory upper airway sound that occurred during the exam.  HEART:  Regular rhythm.  Normal S1 and S2 heard.  No S3, S4, murmur, click, or rub noted.  PMI is not palpable.  ABDOMEN:  Soft, flat, nontender.  No hepatosplenomegaly, epigastric discomfort, or midline pulsatile mass.  Bowel sounds are present in all quadrants.  GENITOURINARY:  Normal male phallus, descended male testicles without lesions.  RECTAL:  Not performed.  EXTREMITIES:  Full range of motion.  No edema.  Intact distal pulses.  NEUROLOGIC:   Cranial nerves II-XII, motor, and sensory grossly intact.  Gait not tested.  SKIN:  Warm, dry, and clear.  LABORATORY DATA:  Electrocardiogram shows normal sinus rhythm, normal ECG.  Initial CK was 247, MB 11.9, troponin 0.02.  COMMENTS:  This entire episode, which included a coughing spell, could easily have been produced by GERD which was severe enough to reach the larynx. However, his risk factors for CAD are concerning as is the elevated CK-MB. Plan is as outlined above.  I appreciate the opportunity to be of assistance in the care of Mr. Lotus Gover.  It has been a pleasure to do so.  I will discuss his further care with you. DD:  08/28/00 TD:  08/28/00 Job: [id number]

## 2010-07-03 NOTE — Cardiovascular Report (Signed)
[place]. [facility]  Patient:    [name] [name], [name] [name]                    MRN: [id number] Proc. Date: 08/29/00 Adm. Date:  [id number] Disc. Date: [id number] Attending:  Darnelle Bos CC:         Cardiac Catheterization Laboratory  Hal T. Stoneking, M.D.  Theressa Millard, M.D.   Cardiac Catheterization  PROCEDURE: 1. Left heart catheterization. 2. Coronary angiography. 3. Left ventriculogram.  CARDIOLOGIST:  Francisca December, M.D.  INDICATIONS:  [name] [name] is a 77 year old man who was admitted 36 hours previously, after a prolonged episode of anterior substernal chest pain. CPK-MBs have been mildly elevated.  The electrocardiogram is unremarkable. He is brought now to the cardiac catheterization laboratory to identify possible obstructive CAD, and provide for further therapeutic options.  DESCRIPTION OF PROCEDURE:  The left heart catheterization was performed following the percutaneous insertion of a 6-French catheter sheath utilizing an anterior approach over a guiding J-wire into the right femoral artery.  A 110 cm pigtail catheter was used to measure pressures in the ascending aorta and the left ventricle, both prior to and following the ventriculogram.  A 30-degree RAO cine left ventriculogram was performed utilizing the power injector.  A coronary angiography was then performed using a 6-French #4 left and right Judkins catheters.  A cine angiography of each coronary artery was carried out in multiple LAO and RAO projections.  At the completion of the procedure the catheters and catheter sheaths were removed.  Hemostasis was achieved by direct pressure.  The patient was transported to the recovery area in stable condition, with intact distal pulse.  HEMODYNAMICS Systemic arterial pressure:  124/51 with a mean of 75 mmHg. There was no systolic gradient across the aortic valve.  Left ventricular end diastolic pressure:  18  mmHg.  ANGIOGRAPHY:  The left ventriculogram demonstrated normal chamber size and normal global systolic function with a calculated ejection fraction of 50%. There was anterolateral hypokinesis noted.  No mitral regurgitation and no significant coronary calcification was seen.  There was a right dominant coronary system present.  RESULTS: 1. Left main coronary artery:  The main left coronary artery was normal. 2. Left anterior descending coronary artery:  The left anterior descending    coronary artery and its branches showed some luminal irregularities, but    was without significant obstruction. 3. Left circumflex coronary artery:  The left circumflex coronary artery    and its branches were without significant obstruction. 4. Right coronary artery:  The right coronary artery and its branches were    without significant obstruction.  FINAL IMPRESSION: 1. No significant obstructive coronary artery disease. 2. Intact global left ventricular size and systolic function with    regional wall motion abnormalities noted. 3. ? Coronary spasm producing wall motion abnormality, elevated CPK,    and chest pain.  PLAN/RECOMMENDATIONS:  At this point no further therapy is recommended, other than an aspirin daily, and treatment for possible severe gastroesophageal reflux disease with a proton pump inhibitor.  Further episodes of chest discomfort may be treated with a long-acting calcium channel blocker. DD:  08/29/00 TD:  08/29/00 Job: [id number]

## 2010-07-14 ENCOUNTER — Other Ambulatory Visit: Payer: Self-pay | Admitting: Oncology

## 2010-07-14 ENCOUNTER — Encounter (HOSPITAL_BASED_OUTPATIENT_CLINIC_OR_DEPARTMENT_OTHER): Payer: Medicare Other | Admitting: Oncology

## 2010-07-14 DIAGNOSIS — D649 Anemia, unspecified: Secondary | ICD-10-CM

## 2010-07-14 DIAGNOSIS — C61 Malignant neoplasm of prostate: Secondary | ICD-10-CM

## 2010-07-14 LAB — CBC WITH DIFFERENTIAL/PLATELET
Basophils Absolute: 0 10*3/uL (ref 0.0–0.1)
EOS%: 4.2 % (ref 0.0–7.0)
Eosinophils Absolute: 0.3 10*3/uL (ref 0.0–0.5)
HCT: 31.5 % — ABNORMAL LOW (ref 38.4–49.9)
HGB: 10.9 g/dL — ABNORMAL LOW (ref 13.0–17.1)
MCH: 32.9 pg (ref 27.2–33.4)
MONO#: 0.3 10*3/uL (ref 0.1–0.9)
NEUT#: 4.6 10*3/uL (ref 1.5–6.5)
NEUT%: 72.2 % (ref 39.0–75.0)
RDW: 13.4 % (ref 11.0–14.6)
WBC: 6.4 10*3/uL (ref 4.0–10.3)
lymph#: 1.2 10*3/uL (ref 0.9–3.3)

## 2010-08-11 ENCOUNTER — Other Ambulatory Visit: Payer: Self-pay | Admitting: Oncology

## 2010-08-11 ENCOUNTER — Telehealth (INDEPENDENT_AMBULATORY_CARE_PROVIDER_SITE_OTHER): Payer: Self-pay | Admitting: General Surgery

## 2010-08-11 ENCOUNTER — Encounter (HOSPITAL_BASED_OUTPATIENT_CLINIC_OR_DEPARTMENT_OTHER): Payer: Medicare Other | Admitting: Oncology

## 2010-08-11 DIAGNOSIS — C61 Malignant neoplasm of prostate: Secondary | ICD-10-CM

## 2010-08-11 LAB — CBC WITH DIFFERENTIAL/PLATELET
Basophils Absolute: 0 10*3/uL (ref 0.0–0.1)
Eosinophils Absolute: 0.3 10*3/uL (ref 0.0–0.5)
HCT: 33 % — ABNORMAL LOW (ref 38.4–49.9)
HGB: 11.3 g/dL — ABNORMAL LOW (ref 13.0–17.1)
MCV: 95.7 fL (ref 79.3–98.0)
MONO%: 5.7 % (ref 0.0–14.0)
NEUT#: 3.9 10*3/uL (ref 1.5–6.5)
NEUT%: 65.3 % (ref 39.0–75.0)
RDW: 13.5 % (ref 11.0–14.6)

## 2010-08-11 NOTE — Telephone Encounter (Signed)
Pt advised to stop aspirin 5 days before sx on 09-23-10 per Dr Dwain Sarna [facility] 08-11-10

## 2010-09-04 NOTE — Op Note (Signed)
  NAME:  [name] [name], [name] [name] NO.:  [id number]  MEDICAL RECORD NO.:  [id number]           PATIENT TYPE:  E  LOCATION:  [facility]                         FACILITY:  [facility]  PHYSICIAN:  Cindee Salt, M.D.       DATE OF BIRTH:  1924/11/12  DATE OF PROCEDURE:  06/24/2010 DATE OF DISCHARGE:  06/20/2010                              OPERATIVE REPORT   PREOPERATIVE DIAGNOSIS:  Status post dog bite, left hand.  POSTOPERATIVE DIAGNOSIS:  Status post dog bite, left hand.  OPERATION:  Incision and drainage for bite wounds, left hand.  SURGEON:  Cindee Salt, MD  ANESTHESIA:  General.  ANESTHESIOLOGIST:  Guadalupe Maple, MD  HISTORY:  The patient is an 77 year old male suffering a dog bite to his left hand.  He was seen in the emergency room where this was cleaned and closed.  He is admitted now for incision and drainage.  Pre, peri, and postoperative courses have been discussed along with risks and complications.  He is aware of these, has decided to proceed.  He is aware that the wounds will be left open with sutures removed.  In the preoperative area, the patient is seen.  The extremity marked by both the patient and surgeon.  PROCEDURE IN DETAIL:  The patient is brought to the operating room where a general anesthetic was carried out without difficulty.  He was prepped using Betadine scrub and solution with the left arm free.  The limb was exsanguinated from the wrist proximally.  Tourniquet was placed high and the arm was inflated to 250 mmHg.  The suture was removed from the dorsal webspace wound.  Purulent material was immediately encountered. This was opened.  A large abscess cavity in the first webspace.  The volar wound was also opened.  This revealed a cavity going down into the thenar eminence.  This had sealed over.  The third one was also opened. This did not reveal any purulent material.  This was on the thenar eminence.  The dorsal remaining wound showed no gross  purulent material. Each was copiously irrigated with saline and packed with Iodoform gauze. Sterile compressive dressing was applied.  On deflation of the tourniquet, all fingers immediately pinked.  He was taken to the recovery room for observation in satisfactory condition.  He will be discharged home to return to the [facility] [place] on Friday on Vicodin and Augmentin.         ______________________________ Cindee Salt, M.D.    GK/MEDQ  D:  06/24/2010  T:  06/25/2010  Job:  [id number]  Electronically Signed by Cindee Salt M.D. on 09/04/2010 09:12:02 AM

## 2010-09-08 ENCOUNTER — Other Ambulatory Visit: Payer: Self-pay | Admitting: Oncology

## 2010-09-08 ENCOUNTER — Encounter (HOSPITAL_BASED_OUTPATIENT_CLINIC_OR_DEPARTMENT_OTHER): Payer: PRIVATE HEALTH INSURANCE | Admitting: Oncology

## 2010-09-08 DIAGNOSIS — C61 Malignant neoplasm of prostate: Secondary | ICD-10-CM

## 2010-09-08 DIAGNOSIS — D638 Anemia in other chronic diseases classified elsewhere: Secondary | ICD-10-CM

## 2010-09-08 DIAGNOSIS — N289 Disorder of kidney and ureter, unspecified: Secondary | ICD-10-CM

## 2010-09-08 DIAGNOSIS — D649 Anemia, unspecified: Secondary | ICD-10-CM

## 2010-09-08 DIAGNOSIS — Z8546 Personal history of malignant neoplasm of prostate: Secondary | ICD-10-CM

## 2010-09-08 LAB — CBC WITH DIFFERENTIAL/PLATELET
Basophils Absolute: 0.1 10*3/uL (ref 0.0–0.1)
Eosinophils Absolute: 0.2 10*3/uL (ref 0.0–0.5)
HGB: 11 g/dL — ABNORMAL LOW (ref 13.0–17.1)
LYMPH%: 20.1 % (ref 14.0–49.0)
MCH: 32.9 pg (ref 27.2–33.4)
MCV: 95.3 fL (ref 79.3–98.0)
MONO%: 3.8 % (ref 0.0–14.0)
NEUT#: 4.7 10*3/uL (ref 1.5–6.5)
Platelets: 152 10*3/uL (ref 140–400)

## 2010-09-08 LAB — COMPREHENSIVE METABOLIC PANEL
Alkaline Phosphatase: 78 U/L (ref 39–117)
BUN: 28 mg/dL — ABNORMAL HIGH (ref 6–23)
Creatinine, Ser: 1.25 mg/dL (ref 0.50–1.35)
Glucose, Bld: 125 mg/dL — ABNORMAL HIGH (ref 70–99)
Total Bilirubin: 0.5 mg/dL (ref 0.3–1.2)

## 2010-09-15 ENCOUNTER — Other Ambulatory Visit (INDEPENDENT_AMBULATORY_CARE_PROVIDER_SITE_OTHER): Payer: Self-pay | Admitting: General Surgery

## 2010-09-15 ENCOUNTER — Encounter (HOSPITAL_COMMUNITY): Payer: Medicare Other

## 2010-09-15 LAB — DIFFERENTIAL
Basophils Relative: 0 % (ref 0–1)
Lymphocytes Relative: 15 % (ref 12–46)
Lymphs Abs: 1 10*3/uL (ref 0.7–4.0)
Monocytes Relative: 6 % (ref 3–12)
Neutro Abs: 5.1 10*3/uL (ref 1.7–7.7)
Neutrophils Relative %: 75 % (ref 43–77)

## 2010-09-15 LAB — BASIC METABOLIC PANEL
CO2: 28 mEq/L (ref 19–32)
Calcium: 9.6 mg/dL (ref 8.4–10.5)
Glucose, Bld: 65 mg/dL — ABNORMAL LOW (ref 70–99)
Sodium: 139 mEq/L (ref 135–145)

## 2010-09-15 LAB — CBC
Hemoglobin: 11.3 g/dL — ABNORMAL LOW (ref 13.0–17.0)
MCH: 31.7 pg (ref 26.0–34.0)
MCV: 96.1 fL (ref 78.0–100.0)
RBC: 3.57 MIL/uL — ABNORMAL LOW (ref 4.22–5.81)

## 2010-09-15 LAB — SURGICAL PCR SCREEN: Staphylococcus aureus: NEGATIVE

## 2010-09-23 ENCOUNTER — Inpatient Hospital Stay (HOSPITAL_COMMUNITY)
Admission: RE | Admit: 2010-09-23 | Discharge: 2010-10-03 | DRG: 336 | Disposition: A | Discharge status: Home / Self Care | Payer: Medicare Other | Source: Ambulatory Visit | Attending: General Surgery | Admitting: General Surgery

## 2010-09-23 DIAGNOSIS — Z950 Presence of cardiac pacemaker: Secondary | ICD-10-CM

## 2010-09-23 DIAGNOSIS — K660 Peritoneal adhesions (postprocedural) (postinfection): Secondary | ICD-10-CM | POA: Diagnosis present

## 2010-09-23 DIAGNOSIS — K432 Incisional hernia without obstruction or gangrene: Principal | ICD-10-CM | POA: Diagnosis present

## 2010-09-23 DIAGNOSIS — K560 Paralytic ileus: Secondary | ICD-10-CM | POA: Diagnosis not present

## 2010-09-23 DIAGNOSIS — J449 Chronic obstructive pulmonary disease, unspecified: Secondary | ICD-10-CM | POA: Diagnosis present

## 2010-09-23 DIAGNOSIS — Z01812 Encounter for preprocedural laboratory examination: Secondary | ICD-10-CM

## 2010-09-23 DIAGNOSIS — G4733 Obstructive sleep apnea (adult) (pediatric): Secondary | ICD-10-CM | POA: Diagnosis present

## 2010-09-23 DIAGNOSIS — J4489 Other specified chronic obstructive pulmonary disease: Secondary | ICD-10-CM | POA: Diagnosis present

## 2010-09-23 DIAGNOSIS — R112 Nausea with vomiting, unspecified: Secondary | ICD-10-CM | POA: Diagnosis not present

## 2010-09-24 LAB — BASIC METABOLIC PANEL
BUN: 23 mg/dL (ref 6–23)
CO2: 28 mEq/L (ref 19–32)
Calcium: 8.8 mg/dL (ref 8.4–10.5)
Glucose, Bld: 128 mg/dL — ABNORMAL HIGH (ref 70–99)
Sodium: 140 mEq/L (ref 135–145)

## 2010-09-24 LAB — CBC
HCT: 32.9 % — ABNORMAL LOW (ref 39.0–52.0)
Hemoglobin: 10.7 g/dL — ABNORMAL LOW (ref 13.0–17.0)
MCHC: 32.5 g/dL (ref 30.0–36.0)

## 2010-09-24 NOTE — Op Note (Signed)
NAMEGABRIELLE, MESTER NO.:  [id number]  MEDICAL RECORD NO.:  [id number]  LOCATION:  1433                         FACILITY:  [facility]  PHYSICIAN:  Juanetta Gosling, MDDATE OF BIRTH:  1924/09/15  DATE OF PROCEDURE:  09/23/2010 DATE OF DISCHARGE:  09/15/2010                              OPERATIVE REPORT   PREOPERATIVE DIAGNOSIS:  Recurrent incisional hernia.  POSTOPERATIVE DIAGNOSIS:  Recurrent incisional hernia.  PROCEDURES: 1. Lysis of adhesions x 1 hour. 2. Open ventral hernia repair with 20 x 25 cm Physiomesh. 3. Explantation of mesh.  SURGEON:  Juanetta Gosling, MD.  ASSISTANT:  Thornton Park. Daphine Deutscher, MD.  ANESTHESIA:  General.  SPECIMENS:  None.  DRAINS:  Two 19-French Blake drains to the subcutaneous space.  ESTIMATED BLOOD LOSS:  100 mL.  COMPLICATIONS:  None.  DISPOSITION:  To recovery room in stable condition.  INDICATIONS:  An 77 year old male who had a primary ventral hernia repair in the past and then had a laparoscopic ventral repair with mesh with a recurrence.  We discussed an open hiatal hernia repair.  I did discuss a laparoscopic repair with him but they thought it would be best to undergo an open hernia repair after we had our discussion.  PROCEDURE:  After informed consent was obtained, the patient was taken to the operating room.  He was administered 2 g of intravenous cefazolin.  Sequential compression devices were placed on the lower extremities prior to induction with anesthesia.  He was then placed under general anesthesia without complication.  A Foley catheter was placed without difficulty.  His abdomen was then prepped and draped in a standard sterile surgical fashion and a surgical time-out was then performed.  I made a midline incision around his umbilicus and carried this down to the level of his defect.  I entered into his peritoneal cavity inferior to this without any evidence of injury and then opened this mesh  up to get to the site where it appeared the hernia was.  This took about an hour of lysis of adhesions.  I eventually was able to split the mesh in its entirety.  There were adhesions from the omentum as well as the small bowel to the mesh.  I took down all of these adhesions and there was no evidence of an enterotomy after looking at this several times.  I did leave a small piece of mesh adherent to the intestine that was not able to get off without making a hole in the bowel and once I had done this, we inspected the bowel several times.  There was no enterotomy. After this I placed the omentum down overlying the underlying the defect.  I then used a 20 x 25 cm Physiomesh and placed this in the abdomen.  I created flaps on either side to give me good overlap.  It actually  came together without any tension in the middle as well.  I then did U stitches through the fascia as well as to the mesh with #1 Prolene throughout the entirety of the mesh about every 2 cm.  These were then tied down and the mesh was pulled up.  There were no evidence of  any holes in this and the mesh was in good position.  I then irrigated again.  I injected packs Exparel into his peritoneum on both sides at that point.  I then closed the fascia with a #1 Novafil figure- of-eight sutures.  I then placed 2 subcutaneous 19 French Blake drains and secured these with 2-0 nylon suture.  These were both functional upon completion.  I then observed hemostasis in his flaps.  I then closed his subcutaneous tissue with 3-0 Vicryl, staples for the skin. Sterile dressings were placed over this.  He tolerated this well, was extubated in the operating room and transferred to recovery room in stable condition.     Juanetta Gosling, MD     MCW/MEDQ  D:  09/23/2010  T:  09/24/2010  Job:  [id number]  cc:   Hal T. Stoneking, M.D. Fax: [phone]  Electronically Signed by Emelia Loron MD on 09/24/2010 02:02:29 PM

## 2010-09-26 LAB — CBC
Hemoglobin: 10.8 g/dL — ABNORMAL LOW (ref 13.0–17.0)
MCH: 31.7 pg (ref 26.0–34.0)
MCV: 95.6 fL (ref 78.0–100.0)
Platelets: 146 10*3/uL — ABNORMAL LOW (ref 150–400)
RBC: 3.41 MIL/uL — ABNORMAL LOW (ref 4.22–5.81)

## 2010-09-26 LAB — BASIC METABOLIC PANEL
CO2: 30 mEq/L (ref 19–32)
Calcium: 8.9 mg/dL (ref 8.4–10.5)
Creatinine, Ser: 0.61 mg/dL (ref 0.50–1.35)
Glucose, Bld: 124 mg/dL — ABNORMAL HIGH (ref 70–99)

## 2010-09-27 ENCOUNTER — Inpatient Hospital Stay (HOSPITAL_COMMUNITY): Payer: Medicare Other

## 2010-09-28 ENCOUNTER — Inpatient Hospital Stay (HOSPITAL_COMMUNITY): Payer: Medicare Other

## 2010-09-28 LAB — CBC
MCH: 32 pg (ref 26.0–34.0)
MCHC: 33.3 g/dL (ref 30.0–36.0)
Platelets: 241 10*3/uL (ref 150–400)
RDW: 13.4 % (ref 11.5–15.5)

## 2010-09-28 LAB — BASIC METABOLIC PANEL
BUN: 24 mg/dL — ABNORMAL HIGH (ref 6–23)
Calcium: 9.3 mg/dL (ref 8.4–10.5)
Creatinine, Ser: 0.73 mg/dL (ref 0.50–1.35)
GFR calc non Af Amer: >60 mL/min (ref >60)
Glucose, Bld: 146 mg/dL — ABNORMAL HIGH (ref 70–99)

## 2010-09-29 ENCOUNTER — Inpatient Hospital Stay (HOSPITAL_COMMUNITY): Payer: Medicare Other

## 2010-09-30 LAB — BASIC METABOLIC PANEL
Calcium: 8.4 mg/dL (ref 8.4–10.5)
GFR calc non Af Amer: >60 mL/min (ref >60)
Glucose, Bld: 107 mg/dL — ABNORMAL HIGH (ref 70–99)
Sodium: 133 mEq/L — ABNORMAL LOW (ref 135–145)

## 2010-10-06 ENCOUNTER — Other Ambulatory Visit: Payer: Self-pay | Admitting: Oncology

## 2010-10-06 ENCOUNTER — Encounter (HOSPITAL_BASED_OUTPATIENT_CLINIC_OR_DEPARTMENT_OTHER): Payer: Medicare Other | Admitting: Oncology

## 2010-10-06 DIAGNOSIS — D649 Anemia, unspecified: Secondary | ICD-10-CM

## 2010-10-06 DIAGNOSIS — C61 Malignant neoplasm of prostate: Secondary | ICD-10-CM

## 2010-10-06 LAB — CBC WITH DIFFERENTIAL/PLATELET
Eosinophils Absolute: 1.4 10*3/uL — ABNORMAL HIGH (ref 0.0–0.5)
MCV: 95.7 fL (ref 79.3–98.0)
MONO%: 4 % (ref 0.0–14.0)
NEUT#: 8.8 10*3/uL — ABNORMAL HIGH (ref 1.5–6.5)
RBC: 3.03 10*6/uL — ABNORMAL LOW (ref 4.20–5.82)
RDW: 14.1 % (ref 11.0–14.6)
WBC: 11.9 10*3/uL — ABNORMAL HIGH (ref 4.0–10.3)

## 2010-10-12 ENCOUNTER — Ambulatory Visit (INDEPENDENT_AMBULATORY_CARE_PROVIDER_SITE_OTHER): Payer: Medicare Other | Admitting: General Surgery

## 2010-10-12 ENCOUNTER — Encounter (INDEPENDENT_AMBULATORY_CARE_PROVIDER_SITE_OTHER): Payer: Self-pay | Admitting: General Surgery

## 2010-10-12 DIAGNOSIS — Z09 Encounter for follow-up examination after completed treatment for conditions other than malignant neoplasm: Secondary | ICD-10-CM

## 2010-10-12 DIAGNOSIS — K432 Incisional hernia without obstruction or gangrene: Secondary | ICD-10-CM

## 2010-10-12 NOTE — Progress Notes (Signed)
Subjective:     Patient ID: [name] [name], male   DOB: [birth year], 77 y.o.   MRN: [id number]  HPI  This is an 77 year old male who had a recurrent ventral hernia. I took him to the operating room for an open ventral hernia repair with extirpation of his prior mesh. He had a long postoperative course with an ileus but now is doing well. He has been out of the hospital about a week. He is having normal bowel movements and passing flatus. He is eating well and hasn't appetite is slowly returning to normal. He is sore but has no other real complaints. He does have some lower extremity edema which was pre-existing it is not significantly changed except for his gotten somewhat worse over the last week at home. He otherwise is breathing without any difficulty. Review of Systems     Objective:   Physical Exam Well healed incision without infection, soft, approp tender    Assessment:     S/p open VH repair    Plan:        I think is the expected course after a difficult open ventral hernia repair with mesh. He had some significant adhesions and took quite a while to open up from his ileus postoperatively. I advanced him to a regular diet. He is still going to not lift over 10 or 15 pounds until he sees me in a month.  He has pre-existing lower extremity edema which he thinks may be a little bit worse lately. I asked them to get in to see his cardiologist.

## 2010-10-14 ENCOUNTER — Telehealth (INDEPENDENT_AMBULATORY_CARE_PROVIDER_SITE_OTHER): Payer: Self-pay | Admitting: General Surgery

## 2010-10-14 NOTE — Telephone Encounter (Signed)
Returned pt's call and pt is questioning a bill he received from [facility] about a C-pap machine from the hospital that Dr Dwain Sarna ordered while pt was in the hospital. The pt's bill is for $400 but the pt called billing at [facility] to let them know he did not use the C-pap machine b/c it was too loud he ended bringing his from home to use while he was in the hospital. The pt needs for Dr Dwain Sarna to send [facility] a letter stating the he declines the order for the C-pap machine on the pt so they can deduct that charge on the pt's bill. The pt stated if you would send a note to [facility] medical records per the pt./ [facility]

## 2010-10-15 ENCOUNTER — Encounter (INDEPENDENT_AMBULATORY_CARE_PROVIDER_SITE_OTHER): Payer: Self-pay | Admitting: General Surgery

## 2010-10-21 ENCOUNTER — Encounter (INDEPENDENT_AMBULATORY_CARE_PROVIDER_SITE_OTHER): Payer: Medicare Other | Admitting: General Surgery

## 2010-10-23 NOTE — Discharge Summary (Signed)
  NAMEMarland [name]  STRUMMER, CANIPE NO.:  [id number]  MEDICAL RECORD NO.:  [id number]  LOCATION:  1433                         FACILITY:  [facility]  PHYSICIAN:  Juanetta Gosling, MDDATE OF BIRTH:  10-01-1924  DATE OF ADMISSION:  09/23/2010 DATE OF DISCHARGE:  10/03/2010                              DISCHARGE SUMMARY   ADMISSION DIAGNOSES: 1. Recurrent ventral hernia. 2. History of pacemaker placement. 3. History of cancer of the prostate treated with radiation therapy. 4. Peripheral neuropathy.  DISCHARGE DIAGNOSES: 1. Recurrent ventral hernia. 2. History of pacemaker placement. 3. History of cancer of the prostate treated with radiation therapy. 4. Peripheral neuropathy. 5. Ileus.  PROCEDURES PERFORMED:  September 23, 2010, lysis of adhesions followed by open ventral hernia repair with 20 x 25-cm Physiomesh and explantation of prior mesh.  HISTORY AND HOSPITAL COURSE:  [name] is an 77 year old male who has a history of multiple hernias.  He had a recurrent ventral incisional hernia that I discussed his options and he elected to have this repaired.  I took him to the operating room on August 8 and performed lysis of adhesions for an hour followed by explantation of his old mesh and an open ventral hernia repair with 20 x 25-cm Physiomesh. Postoperatively, he was maintained on Telemetry.  He was maintained with DVT prophylaxis.  Physical Therapy was seeing him as well.  His main issue is he had a significant ileus as well associated with some nausea and vomiting that eventually resolved over a number of days.  He had no evidence of any infection at all during this period.  He also had an oxygen requirement when he was on his regular CPAP.  This resolved eventually by postoperative day 9.  He was passing gas, tolerating a regular diet, his pain was controlled and is off oxygen and he was discharged to home.  PERTINENT RADIOLOGIC EVALUATION:  An abdominal film  showed a possible ileus on September 27, 2010.  September 29, 2010 showed mild persistent dilatation of small bowel consistent with a likely ileus.  PERTINENT LABORATORY EVALUATION:  Showed an admission hematocrit of 32.9, around discharge it was 34.8.  His creatinine was 0.71 and 0.78 upon discharge.  DISCHARGE MEDICATIONS:  His medications upon discharge were: 1. Ensure. 2. Maalox p.r.n. 3. Psyllium daily. 4. Tramadol as needed. 5. Amlodipine. 6. Aranesp. 7. Aspirin 81 mg. 8. Calcium citrate/vitamin D. 9. Fish oil. 10.Ibuprofen. 11.Lasix 40 mg one to two every morning. 12.Lisinopril 10 mg. 13.Multivitamin. 14.Prilosec. 15.Proscar. 16.Percocet as needed for pain.  FOLLOWUP:  His followup was with Dr. Dwain Sarna in [facility] in 2 weeks.  He was given drain care as well as instruction sheet on care for his incision as well.     Juanetta Gosling, MD     MCW/MEDQ  D:  10/23/2010  T:  10/23/2010  Job:  [id number]  Electronically Signed by Emelia Loron MD on 10/23/2010 04:54:09 PM

## 2010-11-03 ENCOUNTER — Encounter (HOSPITAL_BASED_OUTPATIENT_CLINIC_OR_DEPARTMENT_OTHER): Payer: Medicare Other | Admitting: Oncology

## 2010-11-03 ENCOUNTER — Other Ambulatory Visit: Payer: Self-pay | Admitting: Medical

## 2010-11-03 DIAGNOSIS — N289 Disorder of kidney and ureter, unspecified: Secondary | ICD-10-CM

## 2010-11-03 DIAGNOSIS — D638 Anemia in other chronic diseases classified elsewhere: Secondary | ICD-10-CM

## 2010-11-03 DIAGNOSIS — D649 Anemia, unspecified: Secondary | ICD-10-CM

## 2010-11-03 DIAGNOSIS — C61 Malignant neoplasm of prostate: Secondary | ICD-10-CM

## 2010-11-03 LAB — CBC WITH DIFFERENTIAL/PLATELET
Eosinophils Absolute: 0.6 10*3/uL — ABNORMAL HIGH (ref 0.0–0.5)
MONO#: 0.5 10*3/uL (ref 0.1–0.9)
NEUT#: 6.1 10*3/uL (ref 1.5–6.5)
Platelets: 231 10*3/uL (ref 140–400)
RBC: 3.3 10*6/uL — ABNORMAL LOW (ref 4.20–5.82)
RDW: 14.3 % (ref 11.0–14.6)
WBC: 8.6 10*3/uL (ref 4.0–10.3)

## 2010-11-03 LAB — COMPREHENSIVE METABOLIC PANEL
Albumin: 3.6 g/dL (ref 3.5–5.2)
CO2: 30 mEq/L (ref 19–32)
Glucose, Bld: 110 mg/dL — ABNORMAL HIGH (ref 70–99)
Potassium: 4 mEq/L (ref 3.5–5.3)
Sodium: 137 mEq/L (ref 135–145)
Total Protein: 6.9 g/dL (ref 6.0–8.3)

## 2010-11-03 LAB — PSA: PSA: 0.01 ng/mL (ref <=4.00)

## 2010-11-09 ENCOUNTER — Ambulatory Visit (INDEPENDENT_AMBULATORY_CARE_PROVIDER_SITE_OTHER): Payer: Medicare Other | Admitting: General Surgery

## 2010-11-09 ENCOUNTER — Encounter (INDEPENDENT_AMBULATORY_CARE_PROVIDER_SITE_OTHER): Payer: Self-pay | Admitting: General Surgery

## 2010-11-09 DIAGNOSIS — Z09 Encounter for follow-up examination after completed treatment for conditions other than malignant neoplasm: Secondary | ICD-10-CM

## 2010-11-09 NOTE — Progress Notes (Signed)
Subjective:     Patient ID: [name] [name], male   DOB: [birth year], 77 y.o.   MRN: [id number]  HPI This is an 77 year old male who had a recurrent ventral hernia. I took him to the operating room for an open ventral hernia repair with extirpation of his prior mesh. He had a long postoperative course with an ileus but now is doing well.He is having normal bowel movements and passing flatus. He is eating well and his appetite is slowly returning to normal.He has no real complaints today  Review of Systems     Objective:   Physical Exam Healed midline incision and drain holes, no hernia, nontender    Assessment:     S/p open ventral hernia repair with mesh    Plan:     I released him for work at end of October.  We discussed no lifting over 30-40 pounds. He is going to come back and see me as needed.

## 2010-11-09 NOTE — H&P (Signed)
  NAME:  [name] [name], [name] [name] NO.:  [id number]  MEDICAL RECORD NO.:  [id number]  LOCATION:                                 FACILITY:  PHYSICIAN:  Juanetta Gosling, MDDATE OF BIRTH:  [birth year]  DATE OF ADMISSION:  09/23/2010 DATE OF DISCHARGE:                             HISTORY & PHYSICAL   HISTORY OF PRESENT ILLNESS:  This is an 77 year old male who has undergone a ventral hernia repair in the past with the laparoscope.  He has also undergone  multiple other hernia repairs also.  Postoperatively he had recurrence that is fairly rapidly with likely his mesh has migrated up to his abdomen, some discomfort in this area preventing for doing his activities.  It is bothersome for him.  His wife has also had an incarcerated hernia that has required emergency operation and they are very concerned about this.  CT scan did show a recurrence of his ventral hernia.  MEDICATIONS:  Gabapentin, amlodipine, lisinopril, Lasix, calcium, CPAP, Vicodin, Aranesp, aspirin, multivitamin, finasteride.  ALLERGIES:  None known.  PAST MEDICAL HISTORY: 1. Pacemaker. 2. History of cancer of the prostate, treated with radiation therapy. 3. Low back pain. 4. Peripheral neuropathy.  PAST SURGERY HISTORY:  Laparoscopic left inguinal repair and a recurrent left inguinal repair with mesh, supraumbilical hernia repair and laparoscopic ventral repair with mesh, left total knee replacement.  SOCIAL HISTORY:  Retired.  He is married with 2 children.  Does not smoke or drink.  FAMILY HISTORY:  Significant for cardiovascular disease.  REVIEW OF SYSTEMS:  Otherwise negative.  PHYSICAL EXAMINATION:  GENERAL:  An elderly male in no distress. HEENT:  No scleral icterus. HEART:  Regular rate and rhythm. LUNGS:  Clear bilaterally. ABDOMEN:  He has a small supraumbilical incision with multiple laparoscopic incisions and about a fist size defect above his umbilicus that is recently reducible,  very mildly tender at this point. Otherwise, he soft and nontender.  ASSESSMENT:  Recurrent incisional hernia.  PLAN:  I discussed with him observation versus repair.  They both are very much interested in repair due to the family history of an incarceration.  We discussed laparoscopic versus open repair.  They both would like to proceed with an open repair of this.  We discussed at length the risks and benefits associated with that.     Juanetta Gosling, MD     MCW/MEDQ  D:  10/29/2010  T:  10/29/2010  Job:  [id number]  Electronically Signed by Emelia Loron MD on 11/09/2010 09:39:57 PM

## 2010-11-13 ENCOUNTER — Emergency Department (HOSPITAL_COMMUNITY)
Admission: EM | Admit: 2010-11-13 | Discharge: 2010-11-13 | Disposition: A | Discharge status: Home / Self Care | Payer: Medicare Other | Attending: Emergency Medicine | Admitting: Emergency Medicine

## 2010-11-13 ENCOUNTER — Emergency Department (HOSPITAL_COMMUNITY): Payer: Medicare Other

## 2010-11-13 DIAGNOSIS — S20219A Contusion of unspecified front wall of thorax, initial encounter: Secondary | ICD-10-CM | POA: Insufficient documentation

## 2010-11-13 DIAGNOSIS — I498 Other specified cardiac arrhythmias: Secondary | ICD-10-CM | POA: Insufficient documentation

## 2010-11-13 DIAGNOSIS — S51809A Unspecified open wound of unspecified forearm, initial encounter: Secondary | ICD-10-CM | POA: Insufficient documentation

## 2010-11-13 DIAGNOSIS — M79609 Pain in unspecified limb: Secondary | ICD-10-CM | POA: Insufficient documentation

## 2010-11-13 DIAGNOSIS — Z950 Presence of cardiac pacemaker: Secondary | ICD-10-CM | POA: Insufficient documentation

## 2010-11-13 DIAGNOSIS — I10 Essential (primary) hypertension: Secondary | ICD-10-CM | POA: Insufficient documentation

## 2010-11-13 DIAGNOSIS — Z8546 Personal history of malignant neoplasm of prostate: Secondary | ICD-10-CM | POA: Insufficient documentation

## 2010-11-13 DIAGNOSIS — W010XXA Fall on same level from slipping, tripping and stumbling without subsequent striking against object, initial encounter: Secondary | ICD-10-CM | POA: Insufficient documentation

## 2010-11-13 DIAGNOSIS — R079 Chest pain, unspecified: Secondary | ICD-10-CM | POA: Insufficient documentation

## 2010-11-16 LAB — CBC
HCT: 37 — ABNORMAL LOW
Hemoglobin: 12.3 — ABNORMAL LOW
MCV: 95.8
Platelets: 133 — ABNORMAL LOW
RDW: 14

## 2010-11-16 LAB — COMPREHENSIVE METABOLIC PANEL
ALT: 17
CO2: 30
Calcium: 9.5
Creatinine, Ser: 0.93
GFR calc non Af Amer: >60
Glucose, Bld: 113 — ABNORMAL HIGH
Sodium: 140

## 2010-11-16 LAB — PROTIME-INR: Prothrombin Time: 13.1

## 2010-11-18 ENCOUNTER — Telehealth (INDEPENDENT_AMBULATORY_CARE_PROVIDER_SITE_OTHER): Payer: Self-pay

## 2010-11-18 NOTE — Telephone Encounter (Signed)
LMOM notifying pt that I did copy his RTW note for him to go back to work on 12-16-10 and I put it in the mail today to go to his home.Hulda Humphrey

## 2010-11-27 LAB — BASIC METABOLIC PANEL
BUN: 19
Calcium: 8.6
Chloride: 108
Chloride: 107
Creatinine, Ser: 1.01
Creatinine, Ser: 0.86
GFR calc Af Amer: >60
GFR calc non Af Amer: >60
Glucose, Bld: 98

## 2010-11-27 LAB — CBC
HCT: 30.1 — ABNORMAL LOW
Hemoglobin: 9.8 — ABNORMAL LOW
MCHC: 35
MCHC: 34.4
MCV: 92.7
MCV: 93.9
Platelets: 146 — ABNORMAL LOW
Platelets: 134 — ABNORMAL LOW
Platelets: 141 — ABNORMAL LOW
RBC: 3.03 — ABNORMAL LOW
RBC: 3.17 — ABNORMAL LOW
RDW: 12.3
RDW: 12.2
RDW: 12.2
WBC: 6.1
WBC: 7.4
WBC: 8.8

## 2010-11-27 LAB — URINALYSIS, ROUTINE W REFLEX MICROSCOPIC
Bilirubin Urine: NEGATIVE
Glucose, UA: NEGATIVE
Ketones, ur: NEGATIVE
Nitrite: NEGATIVE
Nitrite: NEGATIVE
Protein, ur: NEGATIVE
Protein, ur: NEGATIVE
pH: 5.5
pH: 5.5

## 2010-11-27 LAB — DIFFERENTIAL
Basophils Absolute: 0.1
Eosinophils Relative: 3
Lymphocytes Relative: 12
Lymphs Abs: 1.1
Neutro Abs: 6.9
Neutrophils Relative %: 78 — ABNORMAL HIGH

## 2010-11-27 LAB — URINE CULTURE
Colony Count: NO GROWTH
Culture: NO GROWTH

## 2010-11-27 LAB — URIC ACID: Uric Acid, Serum: 4.7

## 2010-11-27 LAB — CULTURE, BLOOD (ROUTINE X 2)
Culture: NO GROWTH
Culture: NO GROWTH

## 2010-11-27 LAB — COMPREHENSIVE METABOLIC PANEL
ALT: 15
CO2: 26
Calcium: 8.3 — ABNORMAL LOW
Creatinine, Ser: 0.81
GFR calc non Af Amer: >60
Glucose, Bld: 102 — ABNORMAL HIGH
Sodium: 138
Total Protein: 5.2 — ABNORMAL LOW

## 2010-11-27 LAB — APTT: aPTT: 51 — ABNORMAL HIGH

## 2010-11-27 LAB — PROTIME-INR
INR: 1.1
Prothrombin Time: 14.7

## 2010-12-03 ENCOUNTER — Encounter (HOSPITAL_BASED_OUTPATIENT_CLINIC_OR_DEPARTMENT_OTHER): Payer: Medicare Other | Admitting: Oncology

## 2010-12-03 ENCOUNTER — Other Ambulatory Visit: Payer: Self-pay | Admitting: Oncology

## 2010-12-03 DIAGNOSIS — D649 Anemia, unspecified: Secondary | ICD-10-CM

## 2010-12-03 DIAGNOSIS — C61 Malignant neoplasm of prostate: Secondary | ICD-10-CM

## 2010-12-03 LAB — CBC WITH DIFFERENTIAL/PLATELET
BASO%: 0.2 % (ref 0.0–2.0)
Basophils Absolute: 0 10*3/uL (ref 0.0–0.1)
EOS%: 6.6 % (ref 0.0–7.0)
HGB: 10.2 g/dL — ABNORMAL LOW (ref 13.0–17.1)
MCH: 30.7 pg (ref 27.2–33.4)
MCHC: 33.6 g/dL (ref 32.0–36.0)
MCV: 91.4 fL (ref 79.3–98.0)
MONO%: 4.4 % (ref 0.0–14.0)
NEUT%: 75.7 % — ABNORMAL HIGH (ref 39.0–75.0)
RDW: 14.9 % — ABNORMAL HIGH (ref 11.0–14.6)

## 2010-12-10 ENCOUNTER — Encounter: Payer: Self-pay | Admitting: Oncology

## 2010-12-10 DIAGNOSIS — D649 Anemia, unspecified: Secondary | ICD-10-CM

## 2010-12-15 ENCOUNTER — Other Ambulatory Visit: Payer: Self-pay | Admitting: Geriatric Medicine

## 2010-12-15 ENCOUNTER — Ambulatory Visit
Admission: RE | Admit: 2010-12-15 | Discharge: 2010-12-15 | Disposition: A | Discharge status: Home / Self Care | Payer: Medicare Other | Source: Ambulatory Visit | Attending: Geriatric Medicine | Admitting: Geriatric Medicine

## 2010-12-15 DIAGNOSIS — R51 Headache: Secondary | ICD-10-CM

## 2010-12-15 MED ORDER — IOHEXOL 300 MG/ML  SOLN
75.0000 mL | Freq: Once | INTRAMUSCULAR | Status: AC | PRN
  Administered 2010-12-15: 75 mL via INTRAVENOUS

## 2010-12-29 ENCOUNTER — Ambulatory Visit: Payer: Medicare Other

## 2010-12-29 ENCOUNTER — Other Ambulatory Visit (HOSPITAL_BASED_OUTPATIENT_CLINIC_OR_DEPARTMENT_OTHER): Payer: Medicare Other | Admitting: Lab

## 2010-12-29 ENCOUNTER — Other Ambulatory Visit: Payer: Self-pay | Admitting: Oncology

## 2010-12-29 DIAGNOSIS — C61 Malignant neoplasm of prostate: Secondary | ICD-10-CM

## 2010-12-29 DIAGNOSIS — D649 Anemia, unspecified: Secondary | ICD-10-CM

## 2010-12-29 LAB — CBC WITH DIFFERENTIAL/PLATELET
BASO%: 0.5 % (ref 0.0–2.0)
EOS%: 5.1 % (ref 0.0–7.0)
MCH: 30.1 pg (ref 27.2–33.4)
MCV: 90.8 fL (ref 79.3–98.0)
MONO%: 4.3 % (ref 0.0–14.0)
RBC: 3.88 10*6/uL — ABNORMAL LOW (ref 4.20–5.82)
RDW: 15.6 % — ABNORMAL HIGH (ref 11.0–14.6)
lymph#: 1.3 10*3/uL (ref 0.9–3.3)

## 2010-12-29 MED ORDER — DARBEPOETIN ALFA-POLYSORBATE 500 MCG/ML IJ SOLN: 300.0000 ug | Freq: Once | INTRAMUSCULAR | Status: DC

## 2011-01-14 ENCOUNTER — Institutional Professional Consult (permissible substitution): Payer: Medicare Other | Admitting: Pulmonary Disease

## 2011-01-26 ENCOUNTER — Other Ambulatory Visit: Payer: Self-pay | Admitting: Oncology

## 2011-01-26 ENCOUNTER — Ambulatory Visit (HOSPITAL_BASED_OUTPATIENT_CLINIC_OR_DEPARTMENT_OTHER): Payer: Medicare Other

## 2011-01-26 ENCOUNTER — Other Ambulatory Visit (HOSPITAL_BASED_OUTPATIENT_CLINIC_OR_DEPARTMENT_OTHER): Payer: Medicare Other | Admitting: Lab

## 2011-01-26 VITALS — BP 149/68 | HR 67 | Temp 97.8°F

## 2011-01-26 DIAGNOSIS — D649 Anemia, unspecified: Secondary | ICD-10-CM

## 2011-01-26 LAB — CBC WITH DIFFERENTIAL/PLATELET
Basophils Absolute: 0 10*3/uL (ref 0.0–0.1)
Eosinophils Absolute: 0.2 10*3/uL (ref 0.0–0.5)
HCT: 31.6 % — ABNORMAL LOW (ref 38.4–49.9)
HGB: 10.6 g/dL — ABNORMAL LOW (ref 13.0–17.1)
LYMPH%: 20.8 % (ref 14.0–49.0)
MCV: 90.6 fL (ref 79.3–98.0)
MONO#: 0.3 10*3/uL (ref 0.1–0.9)
MONO%: 4.1 % (ref 0.0–14.0)
NEUT#: 4.8 10*3/uL (ref 1.5–6.5)
Platelets: 153 10*3/uL (ref 140–400)
RBC: 3.49 10*6/uL — ABNORMAL LOW (ref 4.20–5.82)
WBC: 6.7 10*3/uL (ref 4.0–10.3)

## 2011-01-26 MED ORDER — DARBEPOETIN ALFA-POLYSORBATE 500 MCG/ML IJ SOLN
300.0000 ug | Freq: Once | INTRAMUSCULAR | Status: AC
  Administered 2011-01-26: 300 ug via SUBCUTANEOUS
  Filled 2011-01-26: qty 1

## 2011-02-02 ENCOUNTER — Institutional Professional Consult (permissible substitution): Payer: Medicare Other | Admitting: Pulmonary Disease

## 2011-02-04 ENCOUNTER — Ambulatory Visit (INDEPENDENT_AMBULATORY_CARE_PROVIDER_SITE_OTHER): Payer: Medicare Other | Admitting: Pulmonary Disease

## 2011-02-04 ENCOUNTER — Encounter: Payer: Self-pay | Admitting: Pulmonary Disease

## 2011-02-04 VITALS — BP 132/72 | HR 83 | Temp 97.4°F | Ht 70.0 in | Wt 189.0 lb

## 2011-02-04 DIAGNOSIS — R0609 Other forms of dyspnea: Secondary | ICD-10-CM

## 2011-02-04 DIAGNOSIS — J439 Emphysema, unspecified: Secondary | ICD-10-CM | POA: Insufficient documentation

## 2011-02-04 NOTE — Progress Notes (Signed)
  Subjective:    Patient ID: [name] [name], male    DOB: 04-13-1924, 77 y.o.   MRN: [id number]  HPI The patient is an 77 year old male who I have been asked to see for dyspnea on exertion.  The patient has been told that he has COPD, but there are no PFTs available for my review currently.  He has chronic dyspnea on exertion for years, but recently had hernia surgery in August.  He noticed worsening of his shortness of breath after that time.  He did have a chest x-ray in October of this year which showed changes of COPD, minimal pleural thickening related to his history of pneumothorax, and a question of pleural effusions.  His primary physician increased his diuretic dose to treat volume overload, and the patient states that his breathing has much improved.  He feels that he is nearly back to his baseline, but not quite.  He was walking a mile a day prior to his surgery, and has not gotten back to that level.  He denies shortness of breath bringing groceries in from the car, but can get winded walking one flight of stairs.  He denies any significant cough or mucus production.  The patient has been tried on Spiriva at years ago, and is not sure if this helped.   Review of Systems  Constitutional: Negative for fever and unexpected weight change.  HENT: Positive for dental problem. Negative for ear pain, nosebleeds, congestion, sore throat, rhinorrhea, sneezing, trouble swallowing, postnasal drip and sinus pressure.   Eyes: Negative for redness and itching.  Respiratory: Positive for shortness of breath. Negative for cough, chest tightness and wheezing.   Cardiovascular: Positive for leg swelling. Negative for palpitations.  Gastrointestinal: Positive for abdominal pain. Negative for nausea and vomiting.  Genitourinary: Negative for dysuria.  Musculoskeletal: Negative for joint swelling.  Skin: Negative for rash.  Neurological: Negative for headaches.  Hematological: Does not bruise/bleed easily.    Psychiatric/Behavioral: Negative for dysphoric mood. The patient is not nervous/anxious.        Objective:   Physical Exam Constitutional:  Well developed, no acute distress  HENT:  Nares patent without discharge  Oropharynx without exudate, palate and uvula are normal  Eyes:  Perrla, eomi, no scleral icterus  Neck:  No JVD, no TMG  Cardiovascular:  Normal rate, regular rhythm, no rubs or gallops.  No murmurs        Intact distal pulses  Pulmonary :  Mildly decreased breath sounds, no stridor or respiratory distress   No rales, rhonchi, or wheezing  Abdominal:  Soft, nondistended, bowel sounds present.  No tenderness noted.   Musculoskeletal:  No lower extremity edema noted.  Lymph Nodes:  No cervical lymphadenopathy noted  Skin:  No cyanosis noted  Neurologic:  Alert, appropriate, moves all 4 extremities without obvious deficit.         Assessment & Plan:

## 2011-02-04 NOTE — Patient Instructions (Signed)
Will schedule for breathing studies, and see you back same day to review with you.

## 2011-02-04 NOTE — Assessment & Plan Note (Signed)
The patient has a history of chronic dyspnea on exertion, and noted worsening after recent hernia surgery.  He was felt to have some volume overload, and responded well to increased diuretic dose.  He feels that he is nearly back to his baseline, or "just a tad if worse."  He has a long history of smoking, and therefore at significant risk for obstructive lung disease.  He apparently has had COPD diagnosed by PFTs in the past.  At this point, I would like to do pulmonary function studies, and if he does indeed have significant airflow obstruction, would try him on some type of bronchodilator regimen to see if it improves his quality of life.

## 2011-02-16 DIAGNOSIS — Z8719 Personal history of other diseases of the digestive system: Secondary | ICD-10-CM

## 2011-02-16 HISTORY — DX: Personal history of other diseases of the digestive system: Z87.19

## 2011-02-18 ENCOUNTER — Encounter (INDEPENDENT_AMBULATORY_CARE_PROVIDER_SITE_OTHER): Payer: Self-pay | Admitting: General Surgery

## 2011-02-18 ENCOUNTER — Telehealth: Payer: Self-pay | Admitting: Oncology

## 2011-02-18 NOTE — Telephone Encounter (Signed)
S/w pt, gave appts 03/02/11 + 04/13/11. Dates per pt req.

## 2011-02-19 ENCOUNTER — Encounter (INDEPENDENT_AMBULATORY_CARE_PROVIDER_SITE_OTHER): Payer: Self-pay | Admitting: General Surgery

## 2011-02-19 ENCOUNTER — Ambulatory Visit (INDEPENDENT_AMBULATORY_CARE_PROVIDER_SITE_OTHER): Payer: Medicare Other | Admitting: General Surgery

## 2011-02-19 VITALS — BP 130/56 | HR 84 | Temp 97.6°F | Ht 70.0 in | Wt 188.0 lb

## 2011-02-19 DIAGNOSIS — Z09 Encounter for follow-up examination after completed treatment for conditions other than malignant neoplasm: Secondary | ICD-10-CM

## 2011-02-19 NOTE — Progress Notes (Signed)
Subjective:     Patient ID: [name] [name]., male   DOB: [birth year], 78 y.o.   MRN: [id number]  HPI This is an 78 year old male who did an open repair of a recurrent ventral hernia with extirpation of his review smashing. He is doing well except for the fact that he has some pain at the lowermost portion of his hernia surgery. This hurts mostly when he sits for long periods were his belt is. This is also worse doing movements. This has gotten better over this time. He denies any bulge. He comes in today just to see if this is normal and see if there's any other therapy for this at this time.  Review of Systems     Objective:   Physical Exam Healed mildine incision without any infection or hernia, some mild tenderness in lower portion of ab wall but no point tenderness    Assessment:     S/p open VH repair, recurrent hernia with removal of mesh    Plan:        I think he is doing pretty well. I don't think there is anything else to do at this point in time. I told him that the symptoms should get better and it may take up to a year after his surgery for him to reach his new baseline. I asked him to come back and see me as needed.

## 2011-03-02 ENCOUNTER — Ambulatory Visit: Payer: Medicare Other

## 2011-03-02 ENCOUNTER — Other Ambulatory Visit (HOSPITAL_BASED_OUTPATIENT_CLINIC_OR_DEPARTMENT_OTHER): Payer: Medicare Other | Admitting: Lab

## 2011-03-02 DIAGNOSIS — D649 Anemia, unspecified: Secondary | ICD-10-CM

## 2011-03-02 LAB — CBC WITH DIFFERENTIAL/PLATELET
Eosinophils Absolute: 0.2 10*3/uL (ref 0.0–0.5)
HCT: 33 % — ABNORMAL LOW (ref 38.4–49.9)
HGB: 11.2 g/dL — ABNORMAL LOW (ref 13.0–17.1)
LYMPH%: 24.5 % (ref 14.0–49.0)
MONO#: 0.4 10*3/uL (ref 0.1–0.9)
NEUT#: 4.9 10*3/uL (ref 1.5–6.5)
NEUT%: 66.9 % (ref 39.0–75.0)
Platelets: 154 10*3/uL (ref 140–400)
WBC: 7.3 10*3/uL (ref 4.0–10.3)
lymph#: 1.8 10*3/uL (ref 0.9–3.3)

## 2011-03-02 MED ORDER — DARBEPOETIN ALFA-POLYSORBATE 500 MCG/ML IJ SOLN: 300.0000 ug | Freq: Once | INTRAMUSCULAR | Status: DC

## 2011-03-04 DIAGNOSIS — I495 Sick sinus syndrome: Secondary | ICD-10-CM | POA: Diagnosis not present

## 2011-03-08 ENCOUNTER — Ambulatory Visit (INDEPENDENT_AMBULATORY_CARE_PROVIDER_SITE_OTHER): Payer: Medicare Other | Admitting: Pulmonary Disease

## 2011-03-08 ENCOUNTER — Encounter: Payer: Self-pay | Admitting: Pulmonary Disease

## 2011-03-08 VITALS — BP 146/56 | HR 76 | Temp 97.7°F | Ht 72.0 in | Wt 190.0 lb

## 2011-03-08 DIAGNOSIS — R0609 Other forms of dyspnea: Secondary | ICD-10-CM | POA: Diagnosis not present

## 2011-03-08 DIAGNOSIS — J449 Chronic obstructive pulmonary disease, unspecified: Secondary | ICD-10-CM | POA: Diagnosis not present

## 2011-03-08 DIAGNOSIS — R0989 Other specified symptoms and signs involving the circulatory and respiratory systems: Secondary | ICD-10-CM | POA: Diagnosis not present

## 2011-03-08 LAB — PULMONARY FUNCTION TEST

## 2011-03-08 MED ORDER — ALBUTEROL SULFATE HFA 108 (90 BASE) MCG/ACT IN AERS: 2.0000 | INHALATION_SPRAY | Freq: Four times a day (QID) | RESPIRATORY_TRACT | Status: DC | PRN

## 2011-03-08 MED ORDER — BUDESONIDE-FORMOTEROL FUMARATE 160-4.5 MCG/ACT IN AERO: 2.0000 | INHALATION_SPRAY | Freq: Two times a day (BID) | RESPIRATORY_TRACT | Status: DC

## 2011-03-08 NOTE — Patient Instructions (Signed)
Will try symbicort 160/4.5  2 inhalations am and pm everyday.  Rinse mouth well.  Try for 4 weeks, and give me feedback regarding if it is helping.  If it does not, would then try spiriva again since the [place] now has this. Use albuterol 2 puffs every 6hrs if needed for emergencies only.  Don't use if not having a lot of problems.  If doing well, followup with me in 6mos.

## 2011-03-08 NOTE — Assessment & Plan Note (Signed)
Patient has been found to have moderate airflow obstruction but his PFTs, and I suspect this is due to emphysema from his prior history of smoking.  However, I can't exclude the possibility of fixed asthma, given his normal DLCO.  He has tried Spiriva in the past, and did not see a major improvement.  I would like to start him on a LABA/ICS and see if he notices an improvement in his exertional tolerance.  I also reminded him of the importance of ongoing daily activity.

## 2011-03-08 NOTE — Progress Notes (Signed)
  Subjective:    Patient ID: [name] Caprice., male    DOB: 1924-03-04, 78 y.o.   MRN: [id number]  HPI The patient comes in today for followup after his recent pulmonary function studies.  He was found to have moderate airflow obstruction, no restriction, and a normal diffusion capacity.  I have reviewed the studies with him in detail, and answered all of his questions.   Review of Systems  Constitutional: Negative for fever and unexpected weight change.  HENT: Positive for congestion and rhinorrhea. Negative for ear pain, nosebleeds, sore throat, sneezing, trouble swallowing, dental problem, postnasal drip and sinus pressure.   Eyes: Positive for redness. Negative for itching.  Respiratory: Positive for shortness of breath. Negative for cough, chest tightness and wheezing.   Cardiovascular: Positive for leg swelling. Negative for palpitations.  Gastrointestinal: Negative for nausea and vomiting.  Genitourinary: Negative for dysuria.  Musculoskeletal: Negative for joint swelling.  Skin: Negative for rash.  Neurological: Negative for headaches.  Hematological: Bruises/bleeds easily.  Psychiatric/Behavioral: Negative for dysphoric mood. The patient is not nervous/anxious.        Objective:   Physical Exam Well-developed male in no acute distress Nose without purulence or discharge noted Chest with mild decrease in breath sounds, otherwise clear. Cardiac exam with regular rate and rhythm Lower extremities with no significant edema, no cyanosis  alert and oriented, moves all 4 extremities.       Assessment & Plan:

## 2011-03-08 NOTE — Progress Notes (Signed)
PFT was done today.  

## 2011-03-11 ENCOUNTER — Telehealth: Payer: Self-pay | Admitting: Pulmonary Disease

## 2011-03-11 NOTE — Telephone Encounter (Signed)
Called and spoke with pt and he had a question about his cbc order that was on his discharge paper from when he saw [facility].   i reviewed his lab orders and it looks like this was from the cancer [facility].  i advised pt of this and he stated that he will call them to find out about these orders.

## 2011-03-15 DIAGNOSIS — M159 Polyosteoarthritis, unspecified: Secondary | ICD-10-CM | POA: Diagnosis not present

## 2011-03-15 DIAGNOSIS — G609 Hereditary and idiopathic neuropathy, unspecified: Secondary | ICD-10-CM | POA: Diagnosis not present

## 2011-03-15 DIAGNOSIS — G894 Chronic pain syndrome: Secondary | ICD-10-CM | POA: Diagnosis not present

## 2011-03-15 DIAGNOSIS — M47817 Spondylosis without myelopathy or radiculopathy, lumbosacral region: Secondary | ICD-10-CM | POA: Diagnosis not present

## 2011-03-22 DIAGNOSIS — J0120 Acute ethmoidal sinusitis, unspecified: Secondary | ICD-10-CM | POA: Diagnosis not present

## 2011-04-05 ENCOUNTER — Telehealth: Payer: Self-pay | Admitting: Pulmonary Disease

## 2011-04-05 DIAGNOSIS — I10 Essential (primary) hypertension: Secondary | ICD-10-CM | POA: Diagnosis not present

## 2011-04-05 DIAGNOSIS — R0602 Shortness of breath: Secondary | ICD-10-CM | POA: Diagnosis not present

## 2011-04-05 NOTE — Telephone Encounter (Signed)
We talked about trying spiriva again if the symbicort did not help.  It should now be on [place] [facility] formulary If he wishes to try this, can give him samples for 3 weeks, or can send script to [place]? (or he can pick up and take) If I did not schedule f/u with him, would like to see him back in 4mos, but need feedback on whether spiriva helped.

## 2011-04-05 NOTE — Telephone Encounter (Signed)
Spoke with pt. He states calling to give update on symbicort that was started on 03/08/11. He states that he has not noticed any change in his breathing at all. Breathing is no worse, just not any better. Please advise thanks!

## 2011-04-05 NOTE — Telephone Encounter (Signed)
LMOMTCB x 1 

## 2011-04-05 NOTE — Telephone Encounter (Signed)
Pt returned triage's call. Pt stated he will call back around 2:00.  [name] [name]

## 2011-04-06 NOTE — Telephone Encounter (Signed)
Spoke with pt and notified of recs per [facility]. Pt verbalized understanding. States that he is ready to try the spiriva, but wants to take the samples first. He states that will pick up the samples today and call with update in a couple of wks. I have scheduled him for ov with [facility] 07-19-11.

## 2011-04-08 ENCOUNTER — Ambulatory Visit: Payer: Medicare Other | Admitting: Oncology

## 2011-04-08 ENCOUNTER — Other Ambulatory Visit: Payer: Medicare Other | Admitting: Lab

## 2011-04-13 ENCOUNTER — Telehealth: Payer: Self-pay | Admitting: Oncology

## 2011-04-13 ENCOUNTER — Other Ambulatory Visit: Payer: Medicare Other | Admitting: Lab

## 2011-04-13 ENCOUNTER — Ambulatory Visit (HOSPITAL_BASED_OUTPATIENT_CLINIC_OR_DEPARTMENT_OTHER): Payer: Medicare Other | Admitting: Oncology

## 2011-04-13 VITALS — BP 118/59 | HR 83 | Temp 96.9°F | Ht 72.0 in | Wt 194.1 lb

## 2011-04-13 DIAGNOSIS — D638 Anemia in other chronic diseases classified elsewhere: Secondary | ICD-10-CM

## 2011-04-13 DIAGNOSIS — D649 Anemia, unspecified: Secondary | ICD-10-CM

## 2011-04-13 DIAGNOSIS — Z8546 Personal history of malignant neoplasm of prostate: Secondary | ICD-10-CM | POA: Diagnosis not present

## 2011-04-13 LAB — CBC WITH DIFFERENTIAL/PLATELET
Basophils Absolute: 0.1 10*3/uL (ref 0.0–0.1)
EOS%: 3 % (ref 0.0–7.0)
Eosinophils Absolute: 0.2 10*3/uL (ref 0.0–0.5)
HGB: 10.3 g/dL — ABNORMAL LOW (ref 13.0–17.1)
MCH: 32.9 pg (ref 27.2–33.4)
MCV: 96.6 fL (ref 79.3–98.0)
MONO%: 4.6 % (ref 0.0–14.0)
NEUT#: 4.9 10*3/uL (ref 1.5–6.5)
RBC: 3.13 10*6/uL — ABNORMAL LOW (ref 4.20–5.82)
RDW: 14.8 % — ABNORMAL HIGH (ref 11.0–14.6)
lymph#: 1.4 10*3/uL (ref 0.9–3.3)

## 2011-04-13 MED ORDER — DARBEPOETIN ALFA-POLYSORBATE 300 MCG/0.6ML IJ SOLN
300.0000 ug | Freq: Once | INTRAMUSCULAR | Status: AC
  Administered 2011-04-13: 300 ug via SUBCUTANEOUS
  Filled 2011-04-13: qty 0.6

## 2011-04-13 NOTE — Progress Notes (Signed)
Hematology and Oncology Follow Up Visit  [name] [name] [id number] January 02, 1925 78 y.o. 04/13/2011 3:55 PM  CC: Hal T. Pete Glatter, M.D.  [name]. [name], M.D.    Principle Diagnosis:  78 year old gentleman with the following diagnoses. 1. Multifactorial anemia.  He has anemia of chronic disease.  It may be anemia of renal insufficiency. 2. History of prostate cancer status post radiation therapy completed in 2008.  Current therapy: He is on Aranesp 300 mcg subcutaneously every 4 weeks to keep his hemoglobin above 11.  Interim History: Mr. Legler presents today for a followup visit.  Pleasant gentleman with a few comorbid conditions that include hypertension as well as prostate cancer.  He also has anemia of chronic disease as outlined above.  He had been receiving Aranesp as mentioned to keep his hemoglobin above 11 and had been doing relatively well with it.  He had not had any complications associated with it and had not had any thrombotic events.  Had not had any hospitalization or illnesses at the time being.  He did have a hernia operation and was hospitalized for over a week in August 2012.  But, he reports that his abdominal pain has improved dramatically at this time. No new complaints.  Medications: I have reviewed the patient's current medications. Current outpatient prescriptions:albuterol (VENTOLIN HFA) 108 (90 BASE) MCG/ACT inhaler, Inhale 2 puffs into the lungs every 6 (six) hours as needed for wheezing., Disp: 3 Inhaler, Rfl: 1;  amLODipine (NORVASC) 10 MG tablet, Take 5 mg by mouth daily.  , Disp: , Rfl: ;  aspirin 81 MG tablet, Take 81 mg by mouth daily.  , Disp: , Rfl:  budesonide-formoterol (SYMBICORT) 160-4.5 MCG/ACT inhaler, Inhale 2 puffs into the lungs 2 (two) times daily., Disp: 3 Inhaler, Rfl: 1;  calcium-vitamin D (OSCAL WITH D) 250-125 MG-UNIT per tablet, Take 1 tablet by mouth daily.  , Disp: , Rfl: ;  darbepoetin alfa-polysorbate (ARANESP, ALB FREE, SURECLICK) 500  MCG/ML injection, Inject 500 mcg into the skin as needed.  , Disp: , Rfl:  finasteride (PROSCAR) 5 MG tablet, Take 5 mg by mouth daily.  , Disp: , Rfl: ;  furosemide (LASIX) 40 MG tablet, Take 80 mg by mouth 2 (two) times daily. , Disp: , Rfl: ;  HYDROcodone-acetaminophen (VICODIN) 5-500 MG per tablet, Take 1 tablet by mouth every 6 (six) hours as needed.  , Disp: , Rfl: ;  lisinopril (PRINIVIL,ZESTRIL) 10 MG tablet, Take 10 mg by mouth daily.  , Disp: , Rfl:  Multiple Vitamins-Minerals (ICAPS PO), Take 2 capsules by mouth 2 (two) times daily. , Disp: , Rfl: ;  Multiple Vitamins-Minerals (MULTIVITAMIN WITH MINERALS) tablet, Take 1 tablet by mouth daily.  , Disp: , Rfl: ;  Omega-3 Fatty Acids (FISH OIL) 1200 MG CAPS, Take 1 capsule by mouth daily.  , Disp: , Rfl: ;  omeprazole (PRILOSEC) 20 MG capsule, Take 20 mg by mouth daily.  , Disp: , Rfl:  traMADol (ULTRAM) 50 MG tablet, Take 50 mg by mouth every 6 (six) hours as needed.  , Disp: , Rfl:  Current facility-administered medications:darbepoetin (ARANESP) injection 300 mcg, 300 mcg, Subcutaneous, Once, Eli Hose, MD, 300 mcg at 04/13/11 1548  Allergies: No Known Allergies  Past Medical History, Surgical history, Social history, and Family History were reviewed and updated.  Review of Systems: Constitutional:  Negative for fever, chills, night sweats, anorexia, weight loss, pain. Cardiovascular: no chest pain or dyspnea on exertion Respiratory: no cough, shortness of breath, or  wheezing Neurological: no TIA or stroke symptoms Dermatological: negative ENT: negative Skin: Negative. Gastrointestinal: no abdominal pain, change in bowel habits, or black or bloody stools Genito-Urinary: negative Hematological and Lymphatic: negative Breast: negative Musculoskeletal: negative Remaining ROS negative. Physical Exam: Blood pressure 118/59, pulse 83, temperature 96.9 F (36.1 C), height 6' (1.829 m), weight 194 lb 1.6 oz (88.043 kg). ECOG:  1 General appearance: alert Head: Normocephalic, without obvious abnormality, atraumatic Neck: no adenopathy, no carotid bruit, no JVD, supple, symmetrical, trachea midline and thyroid not enlarged, symmetric, no tenderness/mass/nodules Lymph nodes: Cervical, supraclavicular, and axillary nodes normal. Heart:regular rate and rhythm, S1, S2 normal, no murmur, click, rub or gallop Lung:chest clear, no wheezing, rales, normal symmetric air entry Abdomin: soft, non-tender, without masses or organomegaly EXT:no erythema, induration, or nodules   Lab Results: Lab Results  Component Value Date   WBC 6.8 04/13/2011   HGB 10.3* 04/13/2011   HCT 30.3* 04/13/2011   MCV 96.6 04/13/2011   PLT 148 04/13/2011     Chemistry      Component Value Date/Time   NA 137 11/03/2010 1450   K 4.0 11/03/2010 1450   CL 98 11/03/2010 1450   CO2 30 11/03/2010 1450   BUN 28* 11/03/2010 1450   CREATININE 1.11 11/03/2010 1450      Component Value Date/Time   CALCIUM 9.3 11/03/2010 1450   ALKPHOS 73 11/03/2010 1450   AST 19 11/03/2010 1450   ALT 10 11/03/2010 1450   BILITOT 0.5 11/03/2010 1450       Impression and Plan:  78 year old gentleman with the following issues. 1. Multifactorial anemia.  There is an element of anemia of renal insufficiency.  Currently on Aranesp 300 mcg to keep his hemoglobin above 11.  We will proceed today with therapy and we will monitor him on a monthly basis to keep his hemoglobin, as mentioned, above 11.  He also had not reported any specific complications associated with Aranesp. 2. Prostate cancer.  No relapse at this point.   Eli Hose, MD 2/26/20133:55 PM

## 2011-04-13 NOTE — Telephone Encounter (Signed)
Pt appts made and printed for lab injs and md visit

## 2011-04-20 ENCOUNTER — Telehealth: Payer: Self-pay | Admitting: Pulmonary Disease

## 2011-04-20 DIAGNOSIS — Z950 Presence of cardiac pacemaker: Secondary | ICD-10-CM | POA: Diagnosis not present

## 2011-04-20 NOTE — Telephone Encounter (Signed)
Pt hasn't heard anything. Wants to talk to nurse

## 2011-04-20 NOTE — Telephone Encounter (Signed)
I spoke with the [name] [name] and he states he was on symbicort and this did not help his SOB so KC changed him to Spiriva. He states he stopped the symbicort and was just taking the spiriva but he does not see any difference in his SOB on the spiriva either so he has stopped this as well. He wanted to make [name] aware and get any recs he may have. Carron Curie, CMA

## 2011-04-23 ENCOUNTER — Telehealth: Payer: Self-pay | Admitting: Pulmonary Disease

## 2011-04-23 NOTE — Telephone Encounter (Signed)
Let him know there is really nothing else to add.  Would stay on albuterol as needed only, but no reason to leave on maintenance inhalers if he doesn't see a difference.  This makes me think that his sob may be due to something different than his lung disease since he saw no difference with good treatment.  This can include his anemia, deconditioning, and possible underlying heart disease.  Let pt know this, and he can discuss with his primary md.  He is to followup with me if his breathing is getting worse.

## 2011-04-23 NOTE — Telephone Encounter (Signed)
Kim spoke with pt but was unable to get into the pts chart to document.  Pt is aware of KC recs.

## 2011-04-23 NOTE — Telephone Encounter (Signed)
lmtcb

## 2011-04-26 ENCOUNTER — Telehealth: Payer: Self-pay | Admitting: Pulmonary Disease

## 2011-04-26 NOTE — Telephone Encounter (Signed)
Copy of last phone note outlining Dr. Teddy Spike recs for the pt to discuss with his PCP has been faxed to  PCP and also mailed to the pt.  [email or web address] to advise the pt. Carron Curie, CMA

## 2011-04-26 NOTE — Telephone Encounter (Signed)
See phone note from 04-26-11. Carron Curie, CMA

## 2011-04-27 NOTE — Telephone Encounter (Signed)
Pt returned triage's call.  Advised pt that copy of last phone note outlining Dr. Teddy Spike recs for the pt to discuss with his PCP was faxed to the PCP & a copy was mailed to the pt.   Pt stated nothing further needed at this time.  [name] [name]

## 2011-05-10 ENCOUNTER — Other Ambulatory Visit: Payer: Self-pay | Admitting: Geriatric Medicine

## 2011-05-10 DIAGNOSIS — R0602 Shortness of breath: Secondary | ICD-10-CM | POA: Diagnosis not present

## 2011-05-10 DIAGNOSIS — Z79899 Other long term (current) drug therapy: Secondary | ICD-10-CM | POA: Diagnosis not present

## 2011-05-10 DIAGNOSIS — I10 Essential (primary) hypertension: Secondary | ICD-10-CM | POA: Diagnosis not present

## 2011-05-10 DIAGNOSIS — R109 Unspecified abdominal pain: Secondary | ICD-10-CM

## 2011-05-10 DIAGNOSIS — K5900 Constipation, unspecified: Secondary | ICD-10-CM | POA: Diagnosis not present

## 2011-05-11 ENCOUNTER — Ambulatory Visit
Admission: RE | Admit: 2011-05-11 | Discharge: 2011-05-11 | Disposition: A | Discharge status: Home / Self Care | Payer: Medicare Other | Source: Ambulatory Visit | Attending: Geriatric Medicine | Admitting: Geriatric Medicine

## 2011-05-11 ENCOUNTER — Ambulatory Visit: Payer: Medicare Other

## 2011-05-11 ENCOUNTER — Other Ambulatory Visit (HOSPITAL_BASED_OUTPATIENT_CLINIC_OR_DEPARTMENT_OTHER): Payer: Medicare Other | Admitting: Lab

## 2011-05-11 DIAGNOSIS — D649 Anemia, unspecified: Secondary | ICD-10-CM | POA: Diagnosis not present

## 2011-05-11 DIAGNOSIS — K8020 Calculus of gallbladder without cholecystitis without obstruction: Secondary | ICD-10-CM | POA: Diagnosis not present

## 2011-05-11 DIAGNOSIS — R109 Unspecified abdominal pain: Secondary | ICD-10-CM

## 2011-05-11 DIAGNOSIS — K769 Liver disease, unspecified: Secondary | ICD-10-CM | POA: Diagnosis not present

## 2011-05-11 DIAGNOSIS — D7389 Other diseases of spleen: Secondary | ICD-10-CM | POA: Diagnosis not present

## 2011-05-11 LAB — CBC WITH DIFFERENTIAL/PLATELET
BASO%: 0.2 % (ref 0.0–2.0)
Basophils Absolute: 0 10*3/uL (ref 0.0–0.1)
EOS%: 2.8 % (ref 0.0–7.0)
Eosinophils Absolute: 0.2 10*3/uL (ref 0.0–0.5)
HCT: 34.7 % — ABNORMAL LOW (ref 38.4–49.9)
HGB: 11.7 g/dL — ABNORMAL LOW (ref 13.0–17.1)
LYMPH%: 21.7 % (ref 14.0–49.0)
MCH: 32.5 pg (ref 27.2–33.4)
MCHC: 33.8 g/dL (ref 32.0–36.0)
MCV: 96.1 fL (ref 79.3–98.0)
MONO#: 0.3 10*3/uL (ref 0.1–0.9)
MONO%: 4 % (ref 0.0–14.0)
NEUT#: 5 10*3/uL (ref 1.5–6.5)
NEUT%: 71.3 % (ref 39.0–75.0)
Platelets: 146 10*3/uL (ref 140–400)
RBC: 3.61 10*6/uL — ABNORMAL LOW (ref 4.20–5.82)
RDW: 13.8 % (ref 11.0–14.6)
WBC: 7 10*3/uL (ref 4.0–10.3)
lymph#: 1.5 10*3/uL (ref 0.9–3.3)

## 2011-05-11 MED ORDER — IOHEXOL 300 MG/ML  SOLN
100.0000 mL | Freq: Once | INTRAMUSCULAR | Status: AC | PRN
  Administered 2011-05-11: 100 mL via INTRAVENOUS

## 2011-05-11 NOTE — Progress Notes (Signed)
Pt given a copy of his labs. No injection needed. Hgb 11.7. Per Parameters. Pt verbalized understanding and appreciation. He is aware of his next appointment.

## 2011-05-19 ENCOUNTER — Other Ambulatory Visit: Payer: Self-pay | Admitting: *Deleted

## 2011-06-07 DIAGNOSIS — G894 Chronic pain syndrome: Secondary | ICD-10-CM | POA: Diagnosis not present

## 2011-06-07 DIAGNOSIS — G609 Hereditary and idiopathic neuropathy, unspecified: Secondary | ICD-10-CM | POA: Diagnosis not present

## 2011-06-07 DIAGNOSIS — M47817 Spondylosis without myelopathy or radiculopathy, lumbosacral region: Secondary | ICD-10-CM | POA: Diagnosis not present

## 2011-06-08 ENCOUNTER — Ambulatory Visit (HOSPITAL_BASED_OUTPATIENT_CLINIC_OR_DEPARTMENT_OTHER): Payer: Medicare Other

## 2011-06-08 ENCOUNTER — Other Ambulatory Visit (HOSPITAL_BASED_OUTPATIENT_CLINIC_OR_DEPARTMENT_OTHER): Payer: Medicare Other | Admitting: Lab

## 2011-06-08 ENCOUNTER — Other Ambulatory Visit: Payer: Self-pay | Admitting: Oncology

## 2011-06-08 VITALS — BP 122/65 | HR 61 | Temp 97.9°F

## 2011-06-08 DIAGNOSIS — D649 Anemia, unspecified: Secondary | ICD-10-CM

## 2011-06-08 DIAGNOSIS — J449 Chronic obstructive pulmonary disease, unspecified: Secondary | ICD-10-CM

## 2011-06-08 DIAGNOSIS — K432 Incisional hernia without obstruction or gangrene: Secondary | ICD-10-CM

## 2011-06-08 LAB — CBC WITH DIFFERENTIAL/PLATELET
Basophils Absolute: 0 10*3/uL (ref 0.0–0.1)
Eosinophils Absolute: 0.3 10*3/uL (ref 0.0–0.5)
HGB: 10.1 g/dL — ABNORMAL LOW (ref 13.0–17.1)
MCV: 96.2 fL (ref 79.3–98.0)
NEUT#: 4.8 10*3/uL (ref 1.5–6.5)
RDW: 13.5 % (ref 11.0–14.6)
lymph#: 1.5 10*3/uL (ref 0.9–3.3)

## 2011-06-08 MED ORDER — DARBEPOETIN ALFA-POLYSORBATE 300 MCG/0.6ML IJ SOLN
300.0000 ug | Freq: Once | INTRAMUSCULAR | Status: AC
  Administered 2011-06-08: 300 ug via SUBCUTANEOUS
  Filled 2011-06-08: qty 0.6

## 2011-06-08 NOTE — Progress Notes (Signed)
06/08/11 Aranesp given today for Hgb 10.1

## 2011-06-08 NOTE — Patient Instructions (Signed)
Call MD for problems 

## 2011-06-21 DIAGNOSIS — I10 Essential (primary) hypertension: Secondary | ICD-10-CM | POA: Diagnosis not present

## 2011-06-21 DIAGNOSIS — I872 Venous insufficiency (chronic) (peripheral): Secondary | ICD-10-CM | POA: Diagnosis not present

## 2011-06-21 DIAGNOSIS — Z79899 Other long term (current) drug therapy: Secondary | ICD-10-CM | POA: Diagnosis not present

## 2011-07-13 ENCOUNTER — Other Ambulatory Visit (HOSPITAL_BASED_OUTPATIENT_CLINIC_OR_DEPARTMENT_OTHER): Payer: Medicare Other | Admitting: Lab

## 2011-07-13 ENCOUNTER — Ambulatory Visit: Payer: Medicare Other

## 2011-07-13 DIAGNOSIS — D649 Anemia, unspecified: Secondary | ICD-10-CM | POA: Diagnosis not present

## 2011-07-13 DIAGNOSIS — J449 Chronic obstructive pulmonary disease, unspecified: Secondary | ICD-10-CM

## 2011-07-13 DIAGNOSIS — K432 Incisional hernia without obstruction or gangrene: Secondary | ICD-10-CM

## 2011-07-13 LAB — CBC WITH DIFFERENTIAL/PLATELET
Eosinophils Absolute: 0.3 10*3/uL (ref 0.0–0.5)
HCT: 33.8 % — ABNORMAL LOW (ref 38.4–49.9)
HGB: 11.1 g/dL — ABNORMAL LOW (ref 13.0–17.1)
LYMPH%: 21.8 % (ref 14.0–49.0)
MONO#: 0.4 10*3/uL (ref 0.1–0.9)
NEUT#: 4.6 10*3/uL (ref 1.5–6.5)
NEUT%: 67.7 % (ref 39.0–75.0)
Platelets: 164 10*3/uL (ref 140–400)
WBC: 6.8 10*3/uL (ref 4.0–10.3)

## 2011-07-13 MED ORDER — DARBEPOETIN ALFA-POLYSORBATE 300 MCG/0.6ML IJ SOLN: 300.0000 ug | Freq: Once | INTRAMUSCULAR | Status: DC

## 2011-07-19 ENCOUNTER — Ambulatory Visit: Payer: Medicare Other | Admitting: Pulmonary Disease

## 2011-07-19 DIAGNOSIS — M47817 Spondylosis without myelopathy or radiculopathy, lumbosacral region: Secondary | ICD-10-CM | POA: Diagnosis not present

## 2011-07-19 DIAGNOSIS — G894 Chronic pain syndrome: Secondary | ICD-10-CM | POA: Diagnosis not present

## 2011-07-20 ENCOUNTER — Ambulatory Visit: Payer: Medicare Other | Admitting: Pulmonary Disease

## 2011-08-10 ENCOUNTER — Other Ambulatory Visit (HOSPITAL_BASED_OUTPATIENT_CLINIC_OR_DEPARTMENT_OTHER): Payer: Medicare Other | Admitting: Lab

## 2011-08-10 ENCOUNTER — Telehealth: Payer: Self-pay | Admitting: Oncology

## 2011-08-10 ENCOUNTER — Ambulatory Visit (HOSPITAL_BASED_OUTPATIENT_CLINIC_OR_DEPARTMENT_OTHER): Payer: Medicare Other | Admitting: Oncology

## 2011-08-10 VITALS — BP 153/79 | HR 86 | Temp 97.0°F | Ht 72.0 in | Wt 196.7 lb

## 2011-08-10 DIAGNOSIS — D649 Anemia, unspecified: Secondary | ICD-10-CM

## 2011-08-10 DIAGNOSIS — C61 Malignant neoplasm of prostate: Secondary | ICD-10-CM | POA: Diagnosis not present

## 2011-08-10 DIAGNOSIS — I10 Essential (primary) hypertension: Secondary | ICD-10-CM | POA: Diagnosis not present

## 2011-08-10 DIAGNOSIS — N289 Disorder of kidney and ureter, unspecified: Secondary | ICD-10-CM

## 2011-08-10 DIAGNOSIS — J449 Chronic obstructive pulmonary disease, unspecified: Secondary | ICD-10-CM

## 2011-08-10 DIAGNOSIS — K432 Incisional hernia without obstruction or gangrene: Secondary | ICD-10-CM

## 2011-08-10 LAB — CBC WITH DIFFERENTIAL/PLATELET
Basophils Absolute: 0 10*3/uL (ref 0.0–0.1)
EOS%: 7.4 % — ABNORMAL HIGH (ref 0.0–7.0)
Eosinophils Absolute: 0.4 10*3/uL (ref 0.0–0.5)
HCT: 31.1 % — ABNORMAL LOW (ref 38.4–49.9)
HGB: 10.5 g/dL — ABNORMAL LOW (ref 13.0–17.1)
MCH: 31.6 pg (ref 27.2–33.4)
MCV: 93.9 fL (ref 79.3–98.0)
MONO%: 5.3 % (ref 0.0–14.0)
NEUT%: 68 % (ref 39.0–75.0)
lymph#: 1.1 10*3/uL (ref 0.9–3.3)

## 2011-08-10 LAB — COMPREHENSIVE METABOLIC PANEL
AST: 23 U/L (ref 0–37)
BUN: 29 mg/dL — ABNORMAL HIGH (ref 6–23)
Calcium: 9 mg/dL (ref 8.4–10.5)
Chloride: 108 mEq/L (ref 96–112)
Creatinine, Ser: 0.93 mg/dL (ref 0.50–1.35)
Glucose, Bld: 90 mg/dL (ref 70–99)

## 2011-08-10 MED ORDER — DARBEPOETIN ALFA-POLYSORBATE 300 MCG/0.6ML IJ SOLN
300.0000 ug | Freq: Once | INTRAMUSCULAR | Status: AC
  Administered 2011-08-10: 300 ug via SUBCUTANEOUS
  Filled 2011-08-10: qty 0.6

## 2011-08-10 NOTE — Progress Notes (Signed)
Hematology and Oncology Follow Up Visit  [name] [name] [id number] February 19, 1924 78 y.o. 08/10/2011 10:15 AM  CC: [name] [name], M.D.  [name] [name], M.D.    Principle Diagnosis:  78 year old gentleman with the following diagnoses. 1. Multifactorial anemia.  He has anemia of chronic disease.  It may be anemia of renal insufficiency. 2. History of prostate cancer status post radiation therapy completed in 2008.  Current therapy: He is on Aranesp 300 mcg subcutaneously every 4 weeks to keep his hemoglobin above 11.  Interim History: [name] [name] presents today for a followup visit.  Pleasant gentleman with a few comorbid conditions that include hypertension as well as prostate cancer.  He also has anemia of chronic disease as outlined above.  He had been receiving Aranesp as mentioned to keep his hemoglobin above 11 and had been doing relatively well with it.  He had not had any complications associated with it and had not had any thrombotic events.  Had not had any hospitalization or illnesses at the time being. No new complaints.  Medications: I have reviewed the patient's current medications. Current outpatient prescriptions:albuterol (VENTOLIN HFA) 108 (90 BASE) MCG/ACT inhaler, Inhale 2 puffs into the lungs every 6 (six) hours as needed for wheezing., Disp: 3 Inhaler, Rfl: 1;  amLODipine (NORVASC) 10 MG tablet, Take 5 mg by mouth daily.  , Disp: , Rfl: ;  aspirin 81 MG tablet, Take 81 mg by mouth daily.  , Disp: , Rfl:  budesonide-formoterol (SYMBICORT) 160-4.5 MCG/ACT inhaler, Inhale 2 puffs into the lungs 2 (two) times daily., Disp: 3 Inhaler, Rfl: 1;  calcium-vitamin D (OSCAL WITH D) 250-125 MG-UNIT per tablet, Take 1 tablet by mouth daily.  , Disp: , Rfl: ;  darbepoetin alfa-polysorbate (ARANESP, ALB FREE, SURECLICK) 500 MCG/ML injection, Inject 500 mcg into the skin as needed.  , Disp: , Rfl:  finasteride (PROSCAR) 5 MG tablet, Take 5 mg by mouth daily.  , Disp: , Rfl: ;   furosemide (LASIX) 40 MG tablet, Take 80 mg by mouth 2 (two) times daily. , Disp: , Rfl: ;  HYDROcodone-acetaminophen (VICODIN) 5-500 MG per tablet, Take 1 tablet by mouth every 6 (six) hours as needed.  , Disp: , Rfl: ;  lisinopril (PRINIVIL,ZESTRIL) 10 MG tablet, Take 10 mg by mouth daily.  , Disp: , Rfl:  Multiple Vitamins-Minerals (ICAPS PO), Take 2 capsules by mouth 2 (two) times daily. , Disp: , Rfl: ;  Multiple Vitamins-Minerals (MULTIVITAMIN WITH MINERALS) tablet, Take 1 tablet by mouth daily.  , Disp: , Rfl: ;  Omega-3 Fatty Acids (FISH OIL) 1200 MG CAPS, Take 1 capsule by mouth daily.  , Disp: , Rfl: ;  omeprazole (PRILOSEC) 20 MG capsule, Take 20 mg by mouth daily.  , Disp: , Rfl:  traMADol (ULTRAM) 50 MG tablet, Take 50 mg by mouth every 6 (six) hours as needed.  , Disp: , Rfl:  Current facility-administered medications:darbepoetin (ARANESP) injection 300 mcg, 300 mcg, Subcutaneous, Once, Benjiman Core, MD  Allergies: No Known Allergies  Past Medical History, Surgical history, Social history, and Family History were reviewed and updated.  Review of Systems: Constitutional:  Negative for fever, chills, night sweats, anorexia, weight loss, pain. Cardiovascular: no chest pain or dyspnea on exertion Respiratory: no cough, shortness of breath, or wheezing Neurological: no TIA or stroke symptoms Dermatological: negative ENT: negative Skin: Negative. Gastrointestinal: no abdominal pain, change in bowel habits, or black or bloody stools Genito-Urinary: negative Hematological and Lymphatic: negative Breast: negative Musculoskeletal:  negative Remaining ROS negative. Physical Exam: Blood pressure 153/79, pulse 86, temperature 97 F (36.1 C), temperature source Oral, height 6' (1.829 m), weight 196 lb 11.2 oz (89.223 kg). ECOG: 1 General appearance: alert Head: Normocephalic, without obvious abnormality, atraumatic Neck: no adenopathy, no carotid bruit, no JVD, supple, symmetrical,  trachea midline and thyroid not enlarged, symmetric, no tenderness/mass/nodules Lymph nodes: Cervical, supraclavicular, and axillary nodes normal. Heart:regular rate and rhythm, S1, S2 normal, no murmur, click, rub or gallop Lung:chest clear, no wheezing, rales, normal symmetric air entry Abdomin: soft, non-tender, without masses or organomegaly EXT:no erythema, induration, or nodules   Lab Results: Lab Results  Component Value Date   WBC 5.9 08/10/2011   HGB 10.5* 08/10/2011   HCT 31.1* 08/10/2011   MCV 93.9 08/10/2011   PLT 132* 08/10/2011     Chemistry      Component Value Date/Time   NA 137 11/03/2010 1450   K 4.0 11/03/2010 1450   CL 98 11/03/2010 1450   CO2 30 11/03/2010 1450   BUN 28* 11/03/2010 1450   CREATININE 1.11 11/03/2010 1450      Component Value Date/Time   CALCIUM 9.3 11/03/2010 1450   ALKPHOS 73 11/03/2010 1450   AST 19 11/03/2010 1450   ALT 10 11/03/2010 1450   BILITOT 0.5 11/03/2010 1450       Impression and Plan:  78 year old gentleman with the following issues. 1. Multifactorial anemia.  There is an element of anemia of renal insufficiency.  Currently on Aranesp 300 mcg to keep his hemoglobin above 11.  We will proceed today with therapy and we will monitor him on a monthly basis to keep his hemoglobin, as mentioned, above 11.  He also had not reported any specific complications associated with Aranesp. 2. Prostate cancer.  No relapse at this point.   [facility], MD 6/25/201310:15 AM

## 2011-08-10 NOTE — Telephone Encounter (Signed)
Gave pt appt for July, August, September and October, lab, injections and ML,

## 2011-08-16 DIAGNOSIS — I872 Venous insufficiency (chronic) (peripheral): Secondary | ICD-10-CM | POA: Diagnosis not present

## 2011-08-16 DIAGNOSIS — I10 Essential (primary) hypertension: Secondary | ICD-10-CM | POA: Diagnosis not present

## 2011-08-16 DIAGNOSIS — R5383 Other fatigue: Secondary | ICD-10-CM | POA: Diagnosis not present

## 2011-09-07 ENCOUNTER — Ambulatory Visit: Payer: Medicare Other | Admitting: Pulmonary Disease

## 2011-09-07 ENCOUNTER — Ambulatory Visit: Payer: Medicare Other

## 2011-09-07 ENCOUNTER — Other Ambulatory Visit (HOSPITAL_BASED_OUTPATIENT_CLINIC_OR_DEPARTMENT_OTHER): Payer: Medicare Other | Admitting: Lab

## 2011-09-07 DIAGNOSIS — J449 Chronic obstructive pulmonary disease, unspecified: Secondary | ICD-10-CM

## 2011-09-07 DIAGNOSIS — K432 Incisional hernia without obstruction or gangrene: Secondary | ICD-10-CM | POA: Diagnosis not present

## 2011-09-07 DIAGNOSIS — D649 Anemia, unspecified: Secondary | ICD-10-CM

## 2011-09-07 LAB — CBC WITH DIFFERENTIAL/PLATELET
Basophils Absolute: 0 10*3/uL (ref 0.0–0.1)
EOS%: 6 % (ref 0.0–7.0)
Eosinophils Absolute: 0.3 10*3/uL (ref 0.0–0.5)
HCT: 35.6 % — ABNORMAL LOW (ref 38.4–49.9)
HGB: 11.9 g/dL — ABNORMAL LOW (ref 13.0–17.1)
MCH: 32.1 pg (ref 27.2–33.4)
MONO#: 0.3 10*3/uL (ref 0.1–0.9)
NEUT#: 3.2 10*3/uL (ref 1.5–6.5)
NEUT%: 62.4 % (ref 39.0–75.0)
lymph#: 1.3 10*3/uL (ref 0.9–3.3)

## 2011-09-07 MED ORDER — DARBEPOETIN ALFA-POLYSORBATE 300 MCG/0.6ML IJ SOLN: 300.0000 ug | Freq: Once | INTRAMUSCULAR | Status: DC

## 2011-09-13 DIAGNOSIS — L570 Actinic keratosis: Secondary | ICD-10-CM | POA: Diagnosis not present

## 2011-09-13 DIAGNOSIS — I8310 Varicose veins of unspecified lower extremity with inflammation: Secondary | ICD-10-CM | POA: Diagnosis not present

## 2011-09-14 ENCOUNTER — Ambulatory Visit: Payer: Medicare Other | Admitting: Pulmonary Disease

## 2011-09-29 ENCOUNTER — Other Ambulatory Visit: Payer: Self-pay | Admitting: Oncology

## 2011-10-04 DIAGNOSIS — G479 Sleep disorder, unspecified: Secondary | ICD-10-CM | POA: Diagnosis not present

## 2011-10-04 DIAGNOSIS — K219 Gastro-esophageal reflux disease without esophagitis: Secondary | ICD-10-CM | POA: Diagnosis not present

## 2011-10-04 DIAGNOSIS — R0602 Shortness of breath: Secondary | ICD-10-CM | POA: Diagnosis not present

## 2011-10-04 DIAGNOSIS — R5381 Other malaise: Secondary | ICD-10-CM | POA: Diagnosis not present

## 2011-10-04 DIAGNOSIS — K222 Esophageal obstruction: Secondary | ICD-10-CM | POA: Diagnosis not present

## 2011-10-05 ENCOUNTER — Ambulatory Visit (HOSPITAL_BASED_OUTPATIENT_CLINIC_OR_DEPARTMENT_OTHER): Payer: Medicare Other

## 2011-10-05 ENCOUNTER — Other Ambulatory Visit (HOSPITAL_BASED_OUTPATIENT_CLINIC_OR_DEPARTMENT_OTHER): Payer: Medicare Other | Admitting: Lab

## 2011-10-05 VITALS — BP 143/77 | HR 84 | Temp 97.5°F

## 2011-10-05 DIAGNOSIS — J449 Chronic obstructive pulmonary disease, unspecified: Secondary | ICD-10-CM

## 2011-10-05 DIAGNOSIS — D649 Anemia, unspecified: Secondary | ICD-10-CM

## 2011-10-05 DIAGNOSIS — K432 Incisional hernia without obstruction or gangrene: Secondary | ICD-10-CM

## 2011-10-05 LAB — CBC WITH DIFFERENTIAL/PLATELET
Basophils Absolute: 0 10*3/uL (ref 0.0–0.1)
EOS%: 4.9 % (ref 0.0–7.0)
Eosinophils Absolute: 0.2 10*3/uL (ref 0.0–0.5)
HCT: 32.8 % — ABNORMAL LOW (ref 38.4–49.9)
HGB: 10.8 g/dL — ABNORMAL LOW (ref 13.0–17.1)
MCH: 30.9 pg (ref 27.2–33.4)
MCV: 94 fL (ref 79.3–98.0)
MONO%: 6.5 % (ref 0.0–14.0)
NEUT#: 3 10*3/uL (ref 1.5–6.5)
NEUT%: 59.8 % (ref 39.0–75.0)
Platelets: 168 10*3/uL (ref 140–400)

## 2011-10-05 MED ORDER — DARBEPOETIN ALFA-POLYSORBATE 300 MCG/0.6ML IJ SOLN
300.0000 ug | Freq: Once | INTRAMUSCULAR | Status: AC
  Administered 2011-10-05: 300 ug via SUBCUTANEOUS
  Filled 2011-10-05: qty 0.6

## 2011-10-07 DIAGNOSIS — M779 Enthesopathy, unspecified: Secondary | ICD-10-CM | POA: Diagnosis not present

## 2011-10-11 DIAGNOSIS — M47812 Spondylosis without myelopathy or radiculopathy, cervical region: Secondary | ICD-10-CM | POA: Diagnosis not present

## 2011-10-11 DIAGNOSIS — M47817 Spondylosis without myelopathy or radiculopathy, lumbosacral region: Secondary | ICD-10-CM | POA: Diagnosis not present

## 2011-10-11 DIAGNOSIS — G894 Chronic pain syndrome: Secondary | ICD-10-CM | POA: Diagnosis not present

## 2011-10-17 DIAGNOSIS — K859 Acute pancreatitis without necrosis or infection, unspecified: Secondary | ICD-10-CM

## 2011-10-17 DIAGNOSIS — I480 Paroxysmal atrial fibrillation: Secondary | ICD-10-CM

## 2011-10-17 HISTORY — DX: Paroxysmal atrial fibrillation: I48.0

## 2011-10-17 HISTORY — DX: Acute pancreatitis without necrosis or infection, unspecified: K85.90

## 2011-10-19 ENCOUNTER — Emergency Department (HOSPITAL_COMMUNITY): Payer: Medicare Other

## 2011-10-19 ENCOUNTER — Inpatient Hospital Stay (HOSPITAL_COMMUNITY)
Admission: EM | Admit: 2011-10-19 | Discharge: 2011-10-25 | DRG: 415 | Disposition: A | Discharge status: Home / Self Care | Payer: Medicare Other | Attending: Internal Medicine | Admitting: Internal Medicine

## 2011-10-19 ENCOUNTER — Encounter (HOSPITAL_COMMUNITY): Payer: Self-pay | Admitting: Emergency Medicine

## 2011-10-19 ENCOUNTER — Other Ambulatory Visit: Payer: Self-pay

## 2011-10-19 DIAGNOSIS — G4733 Obstructive sleep apnea (adult) (pediatric): Secondary | ICD-10-CM | POA: Diagnosis present

## 2011-10-19 DIAGNOSIS — J449 Chronic obstructive pulmonary disease, unspecified: Secondary | ICD-10-CM

## 2011-10-19 DIAGNOSIS — D649 Anemia, unspecified: Secondary | ICD-10-CM

## 2011-10-19 DIAGNOSIS — K851 Biliary acute pancreatitis without necrosis or infection: Secondary | ICD-10-CM

## 2011-10-19 DIAGNOSIS — I10 Essential (primary) hypertension: Secondary | ICD-10-CM | POA: Diagnosis present

## 2011-10-19 DIAGNOSIS — Z86718 Personal history of other venous thrombosis and embolism: Secondary | ICD-10-CM

## 2011-10-19 DIAGNOSIS — I825Z9 Chronic embolism and thrombosis of unspecified deep veins of unspecified distal lower extremity: Secondary | ICD-10-CM | POA: Diagnosis not present

## 2011-10-19 DIAGNOSIS — C61 Malignant neoplasm of prostate: Secondary | ICD-10-CM | POA: Diagnosis not present

## 2011-10-19 DIAGNOSIS — I059 Rheumatic mitral valve disease, unspecified: Secondary | ICD-10-CM | POA: Diagnosis not present

## 2011-10-19 DIAGNOSIS — I4891 Unspecified atrial fibrillation: Secondary | ICD-10-CM | POA: Diagnosis not present

## 2011-10-19 DIAGNOSIS — R1110 Vomiting, unspecified: Secondary | ICD-10-CM | POA: Diagnosis not present

## 2011-10-19 DIAGNOSIS — J984 Other disorders of lung: Secondary | ICD-10-CM | POA: Diagnosis not present

## 2011-10-19 DIAGNOSIS — K8042 Calculus of bile duct with acute cholecystitis without obstruction: Secondary | ICD-10-CM | POA: Diagnosis present

## 2011-10-19 DIAGNOSIS — K8010 Calculus of gallbladder with chronic cholecystitis without obstruction: Secondary | ICD-10-CM | POA: Diagnosis not present

## 2011-10-19 DIAGNOSIS — Z87891 Personal history of nicotine dependence: Secondary | ICD-10-CM | POA: Diagnosis not present

## 2011-10-19 DIAGNOSIS — Z5331 Laparoscopic surgical procedure converted to open procedure: Secondary | ICD-10-CM | POA: Diagnosis not present

## 2011-10-19 DIAGNOSIS — Z950 Presence of cardiac pacemaker: Secondary | ICD-10-CM

## 2011-10-19 DIAGNOSIS — J439 Emphysema, unspecified: Secondary | ICD-10-CM | POA: Diagnosis present

## 2011-10-19 DIAGNOSIS — J4489 Other specified chronic obstructive pulmonary disease: Secondary | ICD-10-CM | POA: Diagnosis not present

## 2011-10-19 DIAGNOSIS — K8020 Calculus of gallbladder without cholecystitis without obstruction: Secondary | ICD-10-CM | POA: Diagnosis not present

## 2011-10-19 DIAGNOSIS — K8066 Calculus of gallbladder and bile duct with acute and chronic cholecystitis without obstruction: Secondary | ICD-10-CM | POA: Diagnosis not present

## 2011-10-19 DIAGNOSIS — R0602 Shortness of breath: Secondary | ICD-10-CM | POA: Diagnosis not present

## 2011-10-19 DIAGNOSIS — R933 Abnormal findings on diagnostic imaging of other parts of digestive tract: Secondary | ICD-10-CM | POA: Diagnosis not present

## 2011-10-19 DIAGNOSIS — K828 Other specified diseases of gallbladder: Secondary | ICD-10-CM | POA: Diagnosis not present

## 2011-10-19 DIAGNOSIS — Z8546 Personal history of malignant neoplasm of prostate: Secondary | ICD-10-CM

## 2011-10-19 DIAGNOSIS — R932 Abnormal findings on diagnostic imaging of liver and biliary tract: Secondary | ICD-10-CM | POA: Diagnosis not present

## 2011-10-19 DIAGNOSIS — R17 Unspecified jaundice: Secondary | ICD-10-CM | POA: Diagnosis not present

## 2011-10-19 DIAGNOSIS — K859 Acute pancreatitis without necrosis or infection, unspecified: Secondary | ICD-10-CM | POA: Diagnosis not present

## 2011-10-19 DIAGNOSIS — R1011 Right upper quadrant pain: Secondary | ICD-10-CM | POA: Diagnosis not present

## 2011-10-19 DIAGNOSIS — R918 Other nonspecific abnormal finding of lung field: Secondary | ICD-10-CM | POA: Diagnosis not present

## 2011-10-19 DIAGNOSIS — R609 Edema, unspecified: Secondary | ICD-10-CM | POA: Diagnosis not present

## 2011-10-19 DIAGNOSIS — R109 Unspecified abdominal pain: Secondary | ICD-10-CM | POA: Diagnosis not present

## 2011-10-19 HISTORY — DX: Personal history of other venous thrombosis and embolism: Z86.718

## 2011-10-19 HISTORY — DX: Dependence on other enabling machines and devices: Z99.89

## 2011-10-19 HISTORY — DX: Obstructive sleep apnea (adult) (pediatric): G47.33

## 2011-10-19 HISTORY — DX: Gastro-esophageal reflux disease without esophagitis: K21.9

## 2011-10-19 LAB — CBC WITH DIFFERENTIAL/PLATELET
Basophils Relative: 0 % (ref 0–1)
Eosinophils Absolute: 0.1 10*3/uL (ref 0.0–0.7)
Eosinophils Relative: 1 % (ref 0–5)
Lymphs Abs: 1.3 10*3/uL (ref 0.7–4.0)
MCH: 31.7 pg (ref 26.0–34.0)
MCHC: 32.5 g/dL (ref 30.0–36.0)
MCV: 97.6 fL (ref 78.0–100.0)
Monocytes Relative: 2 % — ABNORMAL LOW (ref 3–12)
Neutrophils Relative %: 89 % — ABNORMAL HIGH (ref 43–77)
Platelets: 210 10*3/uL (ref 150–400)

## 2011-10-19 LAB — TROPONIN I: Troponin I: <0.3 ng/mL (ref <0.30)

## 2011-10-19 LAB — COMPREHENSIVE METABOLIC PANEL
Albumin: 4.1 g/dL (ref 3.5–5.2)
Alkaline Phosphatase: 342 U/L — ABNORMAL HIGH (ref 39–117)
BUN: 22 mg/dL (ref 6–23)
Calcium: 10.3 mg/dL (ref 8.4–10.5)
GFR calc Af Amer: 84 mL/min — ABNORMAL LOW (ref >90)
Glucose, Bld: 123 mg/dL — ABNORMAL HIGH (ref 70–99)
Potassium: 3.4 mEq/L — ABNORMAL LOW (ref 3.5–5.1)
Sodium: 139 mEq/L (ref 135–145)
Total Protein: 7.8 g/dL (ref 6.0–8.3)

## 2011-10-19 LAB — POCT I-STAT TROPONIN I: Troponin i, poc: 0.01 ng/mL (ref 0.00–0.08)

## 2011-10-19 LAB — LIPASE, BLOOD: Lipase: >3000 U/L — ABNORMAL HIGH (ref 11–59)

## 2011-10-19 MED ORDER — MORPHINE SULFATE 4 MG/ML IJ SOLN
6.0000 mg | Freq: Once | INTRAMUSCULAR | Status: AC
  Administered 2011-10-19: 6 mg via INTRAVENOUS
  Filled 2011-10-19: qty 2

## 2011-10-19 MED ORDER — ONDANSETRON HCL 4 MG PO TABS
4.0000 mg | ORAL_TABLET | Freq: Four times a day (QID) | ORAL | Status: DC | PRN
  Administered 2011-10-20: 4 mg via ORAL

## 2011-10-19 MED ORDER — SODIUM CHLORIDE 0.9 % IV SOLN
1000.0000 mL | INTRAVENOUS | Status: DC
  Administered 2011-10-19: 1000 mL via INTRAVENOUS

## 2011-10-19 MED ORDER — SODIUM CHLORIDE 0.9 % IV SOLN
1000.0000 mL | Freq: Once | INTRAVENOUS | Status: AC
  Administered 2011-10-19: 1000 mL via INTRAVENOUS

## 2011-10-19 MED ORDER — ONDANSETRON HCL 4 MG/2ML IJ SOLN
4.0000 mg | Freq: Once | INTRAMUSCULAR | Status: AC
  Administered 2011-10-19: 4 mg via INTRAVENOUS
  Filled 2011-10-19: qty 2

## 2011-10-19 MED ORDER — SODIUM CHLORIDE 0.9 % IJ SOLN
3.0000 mL | Freq: Two times a day (BID) | INTRAMUSCULAR | Status: DC
  Administered 2011-10-21 – 2011-10-24 (×3): 3 mL via INTRAVENOUS

## 2011-10-19 MED ORDER — HYDROMORPHONE HCL PF 1 MG/ML IJ SOLN
0.5000 mg | INTRAMUSCULAR | Status: DC | PRN
  Administered 2011-10-20 – 2011-10-22 (×5): 0.5 mg via INTRAVENOUS
  Filled 2011-10-19 (×5): qty 1

## 2011-10-19 MED ORDER — IBUPROFEN 400 MG PO TABS
600.0000 mg | ORAL_TABLET | Freq: Once | ORAL | Status: AC
  Administered 2011-10-19: 600 mg via ORAL
  Filled 2011-10-19: qty 1

## 2011-10-19 MED ORDER — ONDANSETRON HCL 4 MG/2ML IJ SOLN
4.0000 mg | Freq: Four times a day (QID) | INTRAMUSCULAR | Status: DC | PRN
  Filled 2011-10-19: qty 2

## 2011-10-19 MED ORDER — PIPERACILLIN-TAZOBACTAM 3.375 G IVPB
3.3750 g | Freq: Once | INTRAVENOUS | Status: AC
  Administered 2011-10-19: 3.375 g via INTRAVENOUS
  Filled 2011-10-19: qty 50

## 2011-10-19 MED ORDER — PIPERACILLIN-TAZOBACTAM 3.375 G IVPB
3.3750 g | Freq: Three times a day (TID) | INTRAVENOUS | Status: DC
  Administered 2011-10-20 (×2): 3.375 g via INTRAVENOUS
  Filled 2011-10-19 (×3): qty 50

## 2011-10-19 MED ORDER — POTASSIUM CHLORIDE IN NACL 20-0.9 MEQ/L-% IV SOLN
INTRAVENOUS | Status: DC
  Administered 2011-10-20: 01:00:00 via INTRAVENOUS
  Filled 2011-10-19 (×2): qty 1000

## 2011-10-19 MED ORDER — ACETAMINOPHEN 650 MG RE SUPP: 650.0000 mg | Freq: Four times a day (QID) | RECTAL | Status: DC | PRN

## 2011-10-19 MED ORDER — ACETAMINOPHEN 325 MG PO TABS: 650.0000 mg | ORAL_TABLET | Freq: Four times a day (QID) | ORAL | Status: DC | PRN

## 2011-10-19 NOTE — ED Provider Notes (Signed)
History     CSN: [id number]  Arrival date & time 10/19/11  1428   First MD Initiated Contact with Patient 10/19/11 1716      Chief Complaint  Patient presents with  . Emesis     The history is provided by the patient and medical records.   patient reports evidence of severe epigastric and right upper quadrant pain that began today.  He's vomited 5 times and reports his been burping a lot.  Denies chest pain shortness of breath.  He reports is intermittently had right upper quadrant pain for the past several weeks that occurs transiently. He denies fevers or chills.  Denies hematemesis.  He's had no melena or hematochezia.  He denies recent weight loss.  He does have a history of pacemaker placement but has no known history of coronary artery disease.  Review of the medical records demonstrates a prior CT scan with evidence of cholelithiasis.  Past Medical History  Diagnosis Date  . Hypertension   . Hearing loss   . Arthritis   . Prostate cancer   . OSA (obstructive sleep apnea)     Past Surgical History  Procedure Date  . Pacemaker insertion   . Hernia repair     open of recurrent vh  . Hernia repair     lap vh complicated by recurrence  . Pacemaker insertion     Family History  Problem Relation Age of Onset  . Heart disease Father   . Heart disease Mother   . Colon cancer Mother     History  Substance Use Topics  . Smoking status: Former Smoker -- 1.5 packs/day for 35 years    Types: Cigarettes    Quit date: 02/15/1973  . Smokeless tobacco: Never Used  . Alcohol Use: No      Review of Systems  All other systems reviewed and are negative.    Allergies  Review of patient's allergies indicates no known allergies.  Home Medications   Current Outpatient Rx  Name Route Sig Dispense Refill  . ASPIRIN 81 MG PO TABS Oral Take 81 mg by mouth daily.      Marland Kitchen CALCIUM PO Oral Take 2 tablets by mouth daily.    Marland Kitchen DARBEPOETIN ALFA-POLYSORBATE 500 MCG/ML IJ SOLN  Subcutaneous Inject 500 mcg into the skin as needed.      Marland Kitchen FINASTERIDE 5 MG PO TABS Oral Take 5 mg by mouth daily.      . FUROSEMIDE 40 MG PO TABS Oral Take 80 mg by mouth daily.     Marland Kitchen HYDROCODONE-ACETAMINOPHEN 5-500 MG PO TABS Oral Take 1 tablet by mouth every 6 (six) hours as needed.      Marland Kitchen LISINOPRIL 20 MG PO TABS Oral Take 10 mg by mouth daily.    . ICAPS PO Oral Take 2 capsules by mouth 2 (two) times daily.     . MULTI-VITAMIN/MINERALS PO TABS Oral Take 1 tablet by mouth daily.      Marland Kitchen FISH OIL 1200 MG PO CAPS Oral Take 1 capsule by mouth daily.      Marland Kitchen OMEPRAZOLE 20 MG PO CPDR Oral Take 20 mg by mouth 2 (two) times daily.     Marland Kitchen OVER THE COUNTER MEDICATION Oral Take 1 tablet by mouth at bedtime. Ruton 250 mg for bruising      BP 127/41  Pulse 71  Temp 97.8 F (36.6 C) (Oral)  Resp 16  SpO2 98%  Physical Exam  Nursing note and vitals reviewed.  Constitutional: He is oriented to person, place, and time. He appears well-developed and well-nourished.  HENT:  Head: Normocephalic and atraumatic.  Eyes: EOM are normal.  Neck: Normal range of motion.  Cardiovascular: Normal rate, regular rhythm, normal heart sounds and intact distal pulses.   Pulmonary/Chest: Effort normal and breath sounds normal. No respiratory distress.  Abdominal: Soft. He exhibits no distension.       Epigastric and right upper quadrant tenderness with guarding and no rebound  Musculoskeletal: Normal range of motion.  Neurological: He is alert and oriented to person, place, and time.  Skin: Skin is warm and dry.       Jaundice  Psychiatric: He has a normal mood and affect. Judgment normal.    ED Course  Procedures (including critical care time)  Labs Reviewed  CBC WITH DIFFERENTIAL - Abnormal; Notable for the following:    WBC 16.5 (*)     RBC 4.13 (*)     Neutrophils Relative 89 (*)     Neutro Abs 14.8 (*)     Lymphocytes Relative 8 (*)     Monocytes Relative 2 (*)     All other components within  normal limits  COMPREHENSIVE METABOLIC PANEL - Abnormal; Notable for the following:    Potassium 3.4 (*)     Glucose, Bld 123 (*)     AST 106 (*)     ALT 115 (*)     Alkaline Phosphatase 342 (*)     Total Bilirubin 3.6 (*)     GFR calc non Af Amer 73 (*)     GFR calc Af Amer 84 (*)     All other components within normal limits  LIPASE, BLOOD - Abnormal; Notable for the following:    Lipase >3000 (*)     All other components within normal limits  POCT I-STAT TROPONIN I  TROPONIN I   Dg Chest 2 View  10/19/2011  *RADIOLOGY REPORT*  Clinical Data: Emesis.  CHEST - 2 VIEW  Comparison: Chest x-ray 11/13/2010.  Findings: Lung volumes are normal.  No acute consolidative airspace disease.  Linear opacities at the left base, similar to prior examinations, likely represent areas of chronic scarring.  Multiple old healed bilateral rib fractures are again noted.  Pulmonary vasculature and the cardiomediastinal silhouette are within normal limits.  Left-sided pacemaker device in place with lead tips projecting over the expected location of the right atrium and right ventricular apex.  IMPRESSION: 1.  No radiographic evidence of acute cardiopulmonary disease. 2.  Chronic scarring in the left base again noted. 3.  Multiple old healed bilateral rib fractures.   Original Report Authenticated By: Florencia Reasons, M.D.    US Abdomen Complete  10/19/2011  *RADIOLOGY REPORT*  Clinical Data:  Right upper quadrant abdominal pain with emesis.  COMPLETE ABDOMINAL ULTRASOUND  Comparison:  Abdominal CT 05/11/2011.  Abdominal ultrasound 08/30/2005.  Findings:  Gallbladder: Multiple gallstones are present.  The gallbladder is distended without wall thickening or pericholecystic fluid.  A positive sonographic Murphy's sign is reported by the sonographer.  Common bile duct:   The common bile duct is mildly dilated to 8.9 mm.  This appears new compared with the recent abdominal CT.  No ductal calculi are seen.  The distal  duct is obscured by bowel gas.  Liver:  Small calcifications are present within the left hepatic lobe best seen on prior CT.  There are no focal liver lesions.  IVC:  Visualized portions appear unremarkable.  Pancreas:  Visualized portions appear unremarkable.Portions of the pancreas are obscured by bowel gas.  Spleen:  Visualized portions appear unremarkable.  Right Kidney:   The renal cortical thickness and echogenicity are preserved.  There is no hydronephrosis or focal abnormality. Renal length is 10.2 cm.  Left Kidney: There is a 2.6 centimeter cyst in the interpolar region which appears stable.  The renal cortical thickness and echogenicity are preserved.  There is no hydronephrosis or other focal abnormality. Renal length is 10.8 cm.  Abdominal aorta:  Visualized portions appear unremarkable.  IMPRESSION:  1.  Cholelithiasis with distended gallbladder and reported sonographic Murphy's sign.  Early cholecystitis cannot be excluded, however there is no gallbladder wall thickening. 2.  Mild biliary dilatation appears new compared with the abdominal CT of 5 months ago.  Choledocholithiasis should be considered.  Of note, the patient has a pacemaker and cannot undergo MRCP. 3.  No other acute or significant findings.   Original Report Authenticated By: Gerrianne Scale, M.D.     I personally reviewed the imaging tests through PACS system I reviewed available ER/hospitalization records thought the EMR   1. Gallstone pancreatitis       MDM  Patient with gallstone pancreatitis.  The patient receives Zosyn early on for concern for possible cholecystitis.  I think this is choledocholithiasis with associated elevation in liver enzymes and pancreatitis.  The patient has jaundice and I spoke with general surgery are evaluated the patient the bedside.  They're recommending CT scan to evaluate for possible obstructive mass given his advanced age.  I discussed the case with the hospitalist and he will be  admitted.  Gastroenterology will see the patient in the morning [facility])        Lyanne Co, MD 10/19/11 2045

## 2011-10-19 NOTE — ED Notes (Signed)
Patient asleep.  Family didn't want me to wake patient to take vitals.

## 2011-10-19 NOTE — ED Notes (Signed)
Patient transported to Ultrasound 

## 2011-10-19 NOTE — H&P (Signed)
[name] [name]. is an 78 y.o. male.  Patient was seen and examined on October 19, 2011 at 9:45 PM. PCP - Dr. Merlene Laughter. Cardiologist - Dr. Graciela Husbands.  Chief Complaint: Abdominal pain. HPI: 78 year old male with known history of COPD, chronic left lower edema, CA prostate status post radiation treatment, chronic anemia has been experiencing abdominal pain for the last 2-3 weeks. The pain is mostly right upper quadrant and epigastric and is unrelated to food. Denies any diarrhea. Has had 2 episodes of nausea and vomiting. Since the pain worsened patient had come to the ER. In the ER patient's labs show elevated lipase and LFTs. Sonogram shows gallstones with positive Murphy's sign and mildly dilated CBD. Patient has been admitted for further management. Patient denies any chest pain or shortness of breath at this time. Pain is controlled with pain relief medications.   Past Medical History  Diagnosis Date  . Hypertension   . Hearing loss   . Arthritis   . Prostate cancer   . OSA (obstructive sleep apnea)     Past Surgical History  Procedure Date  . Pacemaker insertion   . Hernia repair     open of recurrent vh  . Hernia repair     lap vh complicated by recurrence  . Pacemaker insertion     Family History  Problem Relation Age of Onset  . Heart disease Father   . Heart disease Mother   . Colon cancer Mother    Social History:  reports that he quit smoking about 38 years ago. His smoking use included Cigarettes. He has a 52.5 pack-year smoking history. He has never used smokeless tobacco. He reports that he does not drink alcohol or use illicit drugs.  Allergies: No Known Allergies   (Not in a hospital admission)  Results for orders placed during the hospital encounter of 10/19/11 (from the past 48 hour(s))  POCT I-STAT TROPONIN I     Status: Normal   Collection Time   10/19/11  3:17 PM      Component Value Range Comment   Troponin i, poc 0.01  0.00 - 0.08 ng/mL    Comment 3            CBC WITH DIFFERENTIAL     Status: Abnormal   Collection Time   10/19/11  3:19 PM      Component Value Range Comment   WBC 16.5 (*) 4.0 - 10.5 K/uL    RBC 4.13 (*) 4.22 - 5.81 MIL/uL    Hemoglobin 13.1  13.0 - 17.0 g/dL    HCT 04.5  40.9 - 81.1 %    MCV 97.6  78.0 - 100.0 fL    MCH 31.7  26.0 - 34.0 pg    MCHC 32.5  30.0 - 36.0 g/dL    RDW 91.4  78.2 - 95.6 %    Platelets 210  150 - 400 K/uL    Neutrophils Relative 89 (*) 43 - 77 %    Neutro Abs 14.8 (*) 1.7 - 7.7 K/uL    Lymphocytes Relative 8 (*) 12 - 46 %    Lymphs Abs 1.3  0.7 - 4.0 K/uL    Monocytes Relative 2 (*) 3 - 12 %    Monocytes Absolute 0.4  0.1 - 1.0 K/uL    Eosinophils Relative 1  0 - 5 %    Eosinophils Absolute 0.1  0.0 - 0.7 K/uL    Basophils Relative 0  0 - 1 %  Basophils Absolute 0.0  0.0 - 0.1 K/uL   COMPREHENSIVE METABOLIC PANEL     Status: Abnormal   Collection Time   10/19/11  3:19 PM      Component Value Range Comment   Sodium 139  135 - 145 mEq/L    Potassium 3.4 (*) 3.5 - 5.1 mEq/L    Chloride 98  96 - 112 mEq/L    CO2 28  19 - 32 mEq/L    Glucose, Bld 123 (*) 70 - 99 mg/dL    BUN 22  6 - 23 mg/dL    Creatinine, Ser 1.61  0.50 - 1.35 mg/dL    Calcium 09.6  8.4 - 10.5 mg/dL    Total Protein 7.8  6.0 - 8.3 g/dL    Albumin 4.1  3.5 - 5.2 g/dL    AST 045 (*) 0 - 37 U/L    ALT 115 (*) 0 - 53 U/L    Alkaline Phosphatase 342 (*) 39 - 117 U/L    Total Bilirubin 3.6 (*) 0.3 - 1.2 mg/dL    GFR calc non Af Amer 73 (*) >90 mL/min    GFR calc Af Amer 84 (*) >90 mL/min   TROPONIN I     Status: Normal   Collection Time   10/19/11  5:15 PM      Component Value Range Comment   Troponin I <0.30  <0.30 ng/mL   LIPASE, BLOOD     Status: Abnormal   Collection Time   10/19/11  5:51 PM      Component Value Range Comment   Lipase >3000 (*) 11 - 59 U/L    Dg Chest 2 View  10/19/2011  *RADIOLOGY REPORT*  Clinical Data: Emesis.  CHEST - 2 VIEW  Comparison: Chest x-ray 11/13/2010.  Findings: Lung  volumes are normal.  No acute consolidative airspace disease.  Linear opacities at the left base, similar to prior examinations, likely represent areas of chronic scarring.  Multiple old healed bilateral rib fractures are again noted.  Pulmonary vasculature and the cardiomediastinal silhouette are within normal limits.  Left-sided pacemaker device in place with lead tips projecting over the expected location of the right atrium and right ventricular apex.  IMPRESSION: 1.  No radiographic evidence of acute cardiopulmonary disease. 2.  Chronic scarring in the left base again noted. 3.  Multiple old healed bilateral rib fractures.   Original Report Authenticated By: Florencia Reasons, M.D.    US Abdomen Complete  10/19/2011  *RADIOLOGY REPORT*  Clinical Data:  Right upper quadrant abdominal pain with emesis.  COMPLETE ABDOMINAL ULTRASOUND  Comparison:  Abdominal CT 05/11/2011.  Abdominal ultrasound 08/30/2005.  Findings:  Gallbladder: Multiple gallstones are present.  The gallbladder is distended without wall thickening or pericholecystic fluid.  A positive sonographic Murphy's sign is reported by the sonographer.  Common bile duct:   The common bile duct is mildly dilated to 8.9 mm.  This appears new compared with the recent abdominal CT.  No ductal calculi are seen.  The distal duct is obscured by bowel gas.  Liver:  Small calcifications are present within the left hepatic lobe best seen on prior CT.  There are no focal liver lesions.  IVC:  Visualized portions appear unremarkable.  Pancreas:  Visualized portions appear unremarkable.Portions of the pancreas are obscured by bowel gas.  Spleen:  Visualized portions appear unremarkable.  Right Kidney:   The renal cortical thickness and echogenicity are preserved.  There is no hydronephrosis or focal abnormality. Renal  length is 10.2 cm.  Left Kidney: There is a 2.6 centimeter cyst in the interpolar region which appears stable.  The renal cortical thickness and  echogenicity are preserved.  There is no hydronephrosis or other focal abnormality. Renal length is 10.8 cm.  Abdominal aorta:  Visualized portions appear unremarkable.  IMPRESSION:  1.  Cholelithiasis with distended gallbladder and reported sonographic Murphy's sign.  Early cholecystitis cannot be excluded, however there is no gallbladder wall thickening. 2.  Mild biliary dilatation appears new compared with the abdominal CT of 5 months ago.  Choledocholithiasis should be considered.  Of note, the patient has a pacemaker and cannot undergo MRCP. 3.  No other acute or significant findings.   Original Report Authenticated By: Gerrianne Scale, M.D.     Review of Systems  Constitutional: Negative.   HENT: Negative.   Eyes: Negative.   Respiratory: Negative.   Cardiovascular: Negative.   Gastrointestinal: Positive for nausea, vomiting and abdominal pain.  Genitourinary: Negative.   Musculoskeletal: Negative.   Skin: Negative.   Neurological: Negative.   Endo/Heme/Allergies: Negative.   Psychiatric/Behavioral: Negative.     Blood pressure 127/41, pulse 71, temperature 97.8 F (36.6 C), temperature source Oral, resp. rate 16, SpO2 98.00%. Physical Exam  Constitutional: He is oriented to person, place, and time. He appears well-developed and well-nourished. No distress.  HENT:  Head: Normocephalic and atraumatic.  Right Ear: External ear normal.  Left Ear: External ear normal.  Nose: Nose normal.  Mouth/Throat: Oropharynx is clear and moist. No oropharyngeal exudate.  Eyes: Conjunctivae are normal. Pupils are equal, round, and reactive to light. Right eye exhibits no discharge. Left eye exhibits no discharge. No scleral icterus.  Neck: Normal range of motion. Neck supple.  Cardiovascular: Normal rate and regular rhythm.   Respiratory: Effort normal and breath sounds normal. No respiratory distress. He has no wheezes.  GI: Soft. Bowel sounds are normal. He exhibits no distension. There is  no tenderness. There is no rebound.  Musculoskeletal: He exhibits edema.  Neurological: He is alert and oriented to person, place, and time. No cranial nerve deficit. Coordination normal.       Moves all extremities.  Skin: Skin is warm and dry. He is not diaphoretic.  Psychiatric: His behavior is normal.     Assessment/Plan #1. Pancreatitis most likely secondary gallstones with possible cholecystitis - patient has been started on Zosyn which we will continue. Patient will be kept n.p.o. Gentle hydration. General surgery and gastroenterologist Dr. Matthias Hughs were consulted and we will follow their recommendations. #2. COPD presently not wheezing - continue nebulizer. #3. Chronic anemia -follow CBC. #4. Chronic left lower extremity edema - check Dopplers to rule out DVT. #5. History of cancer prostate status post radiation.  CODE STATUS - full code.   Eduard Clos. 10/19/2011, 10:04 PM

## 2011-10-19 NOTE — ED Notes (Signed)
Wife states  Vomiting x 5 today w/ upper epigastic pain burping a lot wife staes has a pacemaker

## 2011-10-19 NOTE — Consult Note (Signed)
Reason for Consult:abdominal pain Referring Physician: ED physician  [name] [name]. is an 78 y.o. male approximate 3 week history of on and off epigastric abdominal pain associated with nausea vomiting. The patient states that the last several days except as been increasing. Patient  states that prior to 3 weeks ago he has not had any previous occurrences of abdominal pain. He states his no inciting factors or relieving factors. He states he's had no fevers or chills while at home. He states is no weight loss. Patient underwent ultrasound in the ER and was found to have a distended gallbladder with sludge, however no stones were identified. Due the patient's elevated pancreatic and LFTs surgical consultation was obtained.  Past Medical History  Diagnosis Date  . Hypertension   . Hearing loss   . Arthritis   . Prostate cancer   . OSA (obstructive sleep apnea)     Past Surgical History  Procedure Date  . Pacemaker insertion   . Hernia repair     open of recurrent vh  . Hernia repair     lap vh complicated by recurrence  . Pacemaker insertion     Family History  Problem Relation Age of Onset  . Heart disease Father   . Heart disease Mother   . Colon cancer Mother     Social History:  reports that he quit smoking about 38 years ago. His smoking use included Cigarettes. He has a 52.5 pack-year smoking history. He has never used smokeless tobacco. He reports that he does not drink alcohol or use illicit drugs.  Allergies: No Known Allergies  Medications: I have reviewed the patient's current medications.  Results for orders placed during the hospital encounter of 10/19/11 (from the past 48 hour(s))  POCT I-STAT TROPONIN I     Status: Normal   Collection Time   10/19/11  3:17 PM      Component Value Range Comment   Troponin i, poc 0.01  0.00 - 0.08 ng/mL    Comment 3            CBC WITH DIFFERENTIAL     Status: Abnormal   Collection Time   10/19/11  3:19 PM      Component  Value Range Comment   WBC 16.5 (*) 4.0 - 10.5 K/uL    RBC 4.13 (*) 4.22 - 5.81 MIL/uL    Hemoglobin 13.1  13.0 - 17.0 g/dL    HCT 65.7  84.6 - 96.2 %    MCV 97.6  78.0 - 100.0 fL    MCH 31.7  26.0 - 34.0 pg    MCHC 32.5  30.0 - 36.0 g/dL    RDW 95.2  84.1 - 32.4 %    Platelets 210  150 - 400 K/uL    Neutrophils Relative 89 (*) 43 - 77 %    Neutro Abs 14.8 (*) 1.7 - 7.7 K/uL    Lymphocytes Relative 8 (*) 12 - 46 %    Lymphs Abs 1.3  0.7 - 4.0 K/uL    Monocytes Relative 2 (*) 3 - 12 %    Monocytes Absolute 0.4  0.1 - 1.0 K/uL    Eosinophils Relative 1  0 - 5 %    Eosinophils Absolute 0.1  0.0 - 0.7 K/uL    Basophils Relative 0  0 - 1 %    Basophils Absolute 0.0  0.0 - 0.1 K/uL   COMPREHENSIVE METABOLIC PANEL     Status: Abnormal  Collection Time   10/19/11  3:19 PM      Component Value Range Comment   Sodium 139  135 - 145 mEq/L    Potassium 3.4 (*) 3.5 - 5.1 mEq/L    Chloride 98  96 - 112 mEq/L    CO2 28  19 - 32 mEq/L    Glucose, Bld 123 (*) 70 - 99 mg/dL    BUN 22  6 - 23 mg/dL    Creatinine, Ser 1.61  0.50 - 1.35 mg/dL    Calcium 09.6  8.4 - 10.5 mg/dL    Total Protein 7.8  6.0 - 8.3 g/dL    Albumin 4.1  3.5 - 5.2 g/dL    AST 045 (*) 0 - 37 U/L    ALT 115 (*) 0 - 53 U/L    Alkaline Phosphatase 342 (*) 39 - 117 U/L    Total Bilirubin 3.6 (*) 0.3 - 1.2 mg/dL    GFR calc non Af Amer 73 (*) >90 mL/min    GFR calc Af Amer 84 (*) >90 mL/min   TROPONIN I     Status: Normal   Collection Time   10/19/11  5:15 PM      Component Value Range Comment   Troponin I <0.30  <0.30 ng/mL   LIPASE, BLOOD     Status: Abnormal   Collection Time   10/19/11  5:51 PM      Component Value Range Comment   Lipase >3000 (*) 11 - 59 U/L     Dg Chest 2 View  10/19/2011  *RADIOLOGY REPORT*  Clinical Data: Emesis.  CHEST - 2 VIEW  Comparison: Chest x-ray 11/13/2010.  Findings: Lung volumes are normal.  No acute consolidative airspace disease.  Linear opacities at the left base, similar to prior  examinations, likely represent areas of chronic scarring.  Multiple old healed bilateral rib fractures are again noted.  Pulmonary vasculature and the cardiomediastinal silhouette are within normal limits.  Left-sided pacemaker device in place with lead tips projecting over the expected location of the right atrium and right ventricular apex.  IMPRESSION: 1.  No radiographic evidence of acute cardiopulmonary disease. 2.  Chronic scarring in the left base again noted. 3.  Multiple old healed bilateral rib fractures.   Original Report Authenticated By: Florencia Reasons, M.D.    US Abdomen Complete  10/19/2011  *RADIOLOGY REPORT*  Clinical Data:  Right upper quadrant abdominal pain with emesis.  COMPLETE ABDOMINAL ULTRASOUND  Comparison:  Abdominal CT 05/11/2011.  Abdominal ultrasound 08/30/2005.  Findings:  Gallbladder: Multiple gallstones are present.  The gallbladder is distended without wall thickening or pericholecystic fluid.  A positive sonographic Murphy's sign is reported by the sonographer.  Common bile duct:   The common bile duct is mildly dilated to 8.9 mm.  This appears new compared with the recent abdominal CT.  No ductal calculi are seen.  The distal duct is obscured by bowel gas.  Liver:  Small calcifications are present within the left hepatic lobe best seen on prior CT.  There are no focal liver lesions.  IVC:  Visualized portions appear unremarkable.  Pancreas:  Visualized portions appear unremarkable.Portions of the pancreas are obscured by bowel gas.  Spleen:  Visualized portions appear unremarkable.  Right Kidney:   The renal cortical thickness and echogenicity are preserved.  There is no hydronephrosis or focal abnormality. Renal length is 10.2 cm.  Left Kidney: There is a 2.6 centimeter cyst in the interpolar region which appears stable.  The renal cortical thickness and echogenicity are preserved.  There is no hydronephrosis or other focal abnormality. Renal length is 10.8 cm.  Abdominal  aorta:  Visualized portions appear unremarkable.  IMPRESSION:  1.  Cholelithiasis with distended gallbladder and reported sonographic Murphy's sign.  Early cholecystitis cannot be excluded, however there is no gallbladder wall thickening. 2.  Mild biliary dilatation appears new compared with the abdominal CT of 5 months ago.  Choledocholithiasis should be considered.  Of note, the patient has a pacemaker and cannot undergo MRCP. 3.  No other acute or significant findings.   Original Report Authenticated By: Gerrianne Scale, M.D.     Review of Systems  Constitutional:       Jaundiced   HENT: Negative.   Eyes:       Scleral icterus   Respiratory: Negative.   Cardiovascular: Negative.   Gastrointestinal: Positive for heartburn, nausea, vomiting and abdominal pain. Negative for diarrhea, constipation, blood in stool and melena.  Genitourinary: Negative.   Musculoskeletal: Negative.   Skin: Negative.   Neurological: Negative.    Blood pressure 127/41, pulse 71, temperature 97.8 F (36.6 C), temperature source Oral, resp. rate 16, SpO2 98.00%. Physical Exam  Constitutional: He is oriented to person, place, and time. He appears well-developed. He appears distressed.       Jaundiced   HENT:  Head: Normocephalic and atraumatic.  Eyes: Scleral icterus is present.  Neck: Normal range of motion. Neck supple.  Cardiovascular: Normal rate, regular rhythm and normal heart sounds.   Respiratory: Effort normal and breath sounds normal.  GI: Soft. There is tenderness (epigastrum). There is no rebound and no guarding.  Musculoskeletal: Normal range of motion.  Neurological: He is alert and oriented to person, place, and time.    Assessment/Plan: This is an 77 year old male with likely gallstone pancreatitis. However, a pancreatic mass has not been excluded at this time.  1. We'll make the patient n.p.o. At this time and continue with bowel rest. Agree with continued IV antibiotics at this  time.  2. We'll check patient's pancreatic enzymes procedure the operating room once a definite diagnosis of gallstone pancreatitis has been obtained.  3. Recommend a CT scan of the abdomen and pelvis to evaluate for possible pancreatic masses.  4. We will continue to follow with you at this time.  Thank you for this consultation.  Marigene Ehlers., Samariya Rockhold 10/19/2011, 9:30 PM

## 2011-10-19 NOTE — Progress Notes (Signed)
78yo male c/o abdominal pain associated with N/V x3wk to begin IV ABX for gallstone pancreatitis with possible cholecystitis.  Will begin Zosyn 3.375g IV Q8H for CrCl >50 ml/min.  Vernard Gambles, PharmD, BCPS 10/19/2011 11:30 PM

## 2011-10-20 ENCOUNTER — Other Ambulatory Visit: Payer: Self-pay

## 2011-10-20 ENCOUNTER — Encounter (HOSPITAL_COMMUNITY): Payer: Self-pay | Admitting: Gastroenterology

## 2011-10-20 ENCOUNTER — Inpatient Hospital Stay (HOSPITAL_COMMUNITY): Payer: Medicare Other

## 2011-10-20 DIAGNOSIS — R609 Edema, unspecified: Secondary | ICD-10-CM

## 2011-10-20 LAB — CBC WITH DIFFERENTIAL/PLATELET
Eosinophils Relative: 0 % (ref 0–5)
HCT: 33.3 % — ABNORMAL LOW (ref 39.0–52.0)
Hemoglobin: 10.9 g/dL — ABNORMAL LOW (ref 13.0–17.0)
Lymphocytes Relative: 2 % — ABNORMAL LOW (ref 12–46)
Lymphs Abs: 0.3 10*3/uL — ABNORMAL LOW (ref 0.7–4.0)
MCV: 97.1 fL (ref 78.0–100.0)
Monocytes Absolute: 0.2 10*3/uL (ref 0.1–1.0)
Monocytes Relative: 1 % — ABNORMAL LOW (ref 3–12)
Neutro Abs: 14.4 10*3/uL — ABNORMAL HIGH (ref 1.7–7.7)
RBC: 3.43 MIL/uL — ABNORMAL LOW (ref 4.22–5.81)
RDW: 14.9 % (ref 11.5–15.5)
WBC: 14.9 10*3/uL — ABNORMAL HIGH (ref 4.0–10.5)

## 2011-10-20 LAB — LIPASE, BLOOD: Lipase: 2466 U/L — ABNORMAL HIGH (ref 11–59)

## 2011-10-20 LAB — COMPREHENSIVE METABOLIC PANEL
AST: 124 U/L — ABNORMAL HIGH (ref 0–37)
Albumin: 2.6 g/dL — ABNORMAL LOW (ref 3.5–5.2)
Alkaline Phosphatase: 263 U/L — ABNORMAL HIGH (ref 39–117)
Chloride: 105 mEq/L (ref 96–112)
Potassium: 3.6 mEq/L (ref 3.5–5.1)
Sodium: 141 mEq/L (ref 135–145)
Total Bilirubin: 8.2 mg/dL — ABNORMAL HIGH (ref 0.3–1.2)

## 2011-10-20 LAB — HEPATIC FUNCTION PANEL
Albumin: 2.7 g/dL — ABNORMAL LOW (ref 3.5–5.2)
Alkaline Phosphatase: 265 U/L — ABNORMAL HIGH (ref 39–117)
Total Protein: 5.6 g/dL — ABNORMAL LOW (ref 6.0–8.3)

## 2011-10-20 LAB — MAGNESIUM: Magnesium: 1.7 mg/dL (ref 1.5–2.5)

## 2011-10-20 LAB — GLUCOSE, CAPILLARY: Glucose-Capillary: 111 mg/dL — ABNORMAL HIGH (ref 70–99)

## 2011-10-20 MED ORDER — KCL IN DEXTROSE-NACL 20-5-0.45 MEQ/L-%-% IV SOLN
INTRAVENOUS | Status: DC
  Administered 2011-10-20 – 2011-10-22 (×3): via INTRAVENOUS
  Filled 2011-10-20 (×11): qty 1000

## 2011-10-20 MED ORDER — IOHEXOL 300 MG/ML  SOLN
20.0000 mL | INTRAMUSCULAR | Status: AC
  Administered 2011-10-20 (×2): 20 mL via ORAL

## 2011-10-20 MED ORDER — PROMETHAZINE HCL 25 MG/ML IJ SOLN
12.5000 mg | Freq: Four times a day (QID) | INTRAMUSCULAR | Status: DC | PRN
  Administered 2011-10-20: 25 mg via INTRAVENOUS
  Filled 2011-10-20: qty 1

## 2011-10-20 MED ORDER — ONDANSETRON 8 MG/NS 50 ML IVPB
8.0000 mg | Freq: Four times a day (QID) | INTRAVENOUS | Status: DC | PRN
  Filled 2011-10-20: qty 8

## 2011-10-20 MED ORDER — ONDANSETRON HCL 4 MG PO TABS
4.0000 mg | ORAL_TABLET | Freq: Four times a day (QID) | ORAL | Status: DC | PRN
  Filled 2011-10-20: qty 1

## 2011-10-20 MED ORDER — KCL IN DEXTROSE-NACL 40-5-0.45 MEQ/L-%-% IV SOLN
INTRAVENOUS | Status: DC
  Administered 2011-10-20: 14:00:00 via INTRAVENOUS
  Filled 2011-10-20 (×2): qty 1000

## 2011-10-20 MED ORDER — PIPERACILLIN-TAZOBACTAM 3.375 G IVPB
3.3750 g | Freq: Four times a day (QID) | INTRAVENOUS | Status: DC
  Administered 2011-10-20 – 2011-10-25 (×22): 3.375 g via INTRAVENOUS
  Filled 2011-10-20 (×23): qty 50

## 2011-10-20 MED ORDER — PANTOPRAZOLE SODIUM 40 MG IV SOLR
40.0000 mg | Freq: Two times a day (BID) | INTRAVENOUS | Status: DC
  Administered 2011-10-20 – 2011-10-25 (×10): 40 mg via INTRAVENOUS
  Filled 2011-10-20 (×15): qty 40

## 2011-10-20 MED ORDER — BIOTENE DRY MOUTH MT LIQD
15.0000 mL | Freq: Two times a day (BID) | OROMUCOSAL | Status: DC
  Administered 2011-10-20 – 2011-10-25 (×7): 15 mL via OROMUCOSAL

## 2011-10-20 NOTE — Progress Notes (Signed)
Pt's EKG showed normal sinus rhythm with left anterior fascicular block, ST & T wave abnormality considering anterior ischemia. RN paged MD on call

## 2011-10-20 NOTE — Progress Notes (Signed)
Clinical Social Worker attempted to meet with pt in regard to referral for Advanced Directives. However, pt sleeping at this time. Clinical Social Worker to follow up with pt in regard to Advanced Directives and complete full psychosocial assessment at that time.  Jacklynn Lewis, MSW, LCSWA  Clinical Social Work [phone]

## 2011-10-20 NOTE — Progress Notes (Signed)
Physical Therapy Evaluation Patient Details Name: [name] [name]. MRN: [id number] DOB: 1925/02/14 Today's Date: 10/20/2011 Time: 4098-1191 PT Time Calculation (min): 23 min  PT Assessment / Plan / Recommendation Clinical Impression  Pt is 78 yo male with abdominal pain who is expected to have cholecystectomy later this week.  Pt with generalized weakness from frequent abdominal pain past few weeks, recommend acute PT to increase mobility, strength, and independence for d/c home.  Pt will not likely need PT at d/c.    PT Assessment  Patient needs continued PT services    Follow Up Recommendations  No PT follow up;Supervision - Intermittent    Barriers to Discharge None      Equipment Recommendations  None recommended by PT    Recommendations for Other Services     Frequency Min 3X/week    Precautions / Restrictions Precautions Precautions: None Restrictions Weight Bearing Restrictions: No   Pertinent Vitals/Pain Pt describes abdominal pain as "pressure", premedicated VSS      Mobility  Bed Mobility Bed Mobility: Supine to Sit;Sitting - Scoot to Edge of Bed Supine to Sit: 6: Modified independent (Device/Increase time) Sitting - Scoot to Edge of Bed: 6: Modified independent (Device/Increase time) Details for Bed Mobility Assistance: pt able to get to EOB without physical assist Transfers Transfers: Sit to Stand;Stand to Sit Sit to Stand: 4: Min guard;From bed;With upper extremity assist Stand to Sit: 4: Min guard;To chair/3-in-1;With upper extremity assist Details for Transfer Assistance: pt able to stand to RW without physical assist, min-guard in case pt needed steadying Ambulation/Gait Ambulation/Gait Assistance: 4: Min guard Ambulation Distance (Feet): 225 Feet Assistive device: Rolling walker Ambulation/Gait Assistance Details: pt usually uses cane but used RW today as he is currently feeling weaker than his normal state.  Decreased stride length and  inconsistent pace.  Frequent vc's to stay close to RW. Gait Pattern: Step-through pattern;Decreased stride length;Trunk flexed Stairs: No Wheelchair Mobility Wheelchair Mobility: No    Exercises     PT Diagnosis: Generalized weakness;Acute pain  PT Problem List: Decreased activity tolerance;Decreased strength;Decreased mobility;Pain PT Treatment Interventions: DME instruction;Gait training;Stair training;Functional mobility training;Therapeutic activities;Therapeutic exercise;Balance training;Patient/family education   PT Goals Acute Rehab PT Goals PT Goal Formulation: With patient/family Time For Goal Achievement: 11/03/11 Potential to Achieve Goals: Good Pt will go Sit to Stand: with modified independence PT Goal: Sit to Stand - Progress: Goal set today Pt will go Stand to Sit: with modified independence PT Goal: Stand to Sit - Progress: Goal set today Pt will Ambulate: >150 feet;with modified independence;with least restrictive assistive device;with gait velocity >(comment) ft/second (>2.62 ft/sec) PT Goal: Ambulate - Progress: Goal set today Pt will Go Up / Down Stairs: 1-2 stairs;with rail(s);with supervision PT Goal: Up/Down Stairs - Progress: Goal set today Pt will Perform Home Exercise Program: Independently PT Goal: Perform Home Exercise Program - Progress: Goal set today  Visit Information  Last PT Received On: 10/20/11 Assistance Needed: +1    Subjective Data  Subjective: I have been having this pain off and on for 3 wks Patient Stated Goal: return home   Prior Functioning  Home Living Lives With: Spouse Available Help at Discharge: Available 24 hours/day;Family Type of Home: House Home Access: Stairs to enter;Ramped entrance Entrance Stairs-Number of Steps: 2 Home Layout: One level Home Adaptive Equipment: Walker - rolling;Straight cane Prior Function Level of Independence: Independent with assistive device(s) Able to Take Stairs?: Yes Vocation:  Retired Musician: [employer]    Cognition  Overall Cognitive Status:  Appears within functional limits for tasks assessed/performed Arousal/Alertness: Awake/alert Orientation Level: Appears intact for tasks assessed Behavior During Session: [facility] for tasks performed    Extremity/Trunk Assessment Right Upper Extremity Assessment RUE ROM/Strength/Tone: [facility] for tasks assessed Left Upper Extremity Assessment LUE ROM/Strength/Tone: WFL for tasks assessed Right Lower Extremity Assessment RLE ROM/Strength/Tone: Deficits RLE ROM/Strength/Tone Deficits: grossly 4/5 throughout, pt fatigues quickly and has had decreased mobility past seveal wks due to abdominal pain RLE Sensation: History of peripheral neuropathy RLE Coordination: WFL - gross motor Left Lower Extremity Assessment LLE ROM/Strength/Tone: Deficits LLE ROM/Strength/Tone Deficits: same as RLE LLE Sensation: History of peripheral neuropathy LLE Coordination: WFL - gross motor Trunk Assessment Trunk Assessment: Normal   Balance Balance Balance Assessed: Yes Static Standing Balance Static Standing - Balance Support: No upper extremity supported;During functional activity Static Standing - Level of Assistance: 5: Stand by assistance  End of Session PT - End of Session Equipment Utilized During Treatment: Gait belt Activity Tolerance: Patient tolerated treatment well;Patient limited by fatigue Patient left: in chair;with call bell/phone within reach Nurse Communication: Mobility status  GP   Lyanne Co, PT  Acute Rehab Services  [phone]   Lyanne Co 10/20/2011, 2:42 PM

## 2011-10-20 NOTE — Progress Notes (Signed)
Pt had some trigeminy, bigeminy, and paired PVCs on telemetry. RN paged MD on call and got a STAT EKG.

## 2011-10-20 NOTE — Progress Notes (Signed)
Subjective: Resting in bed, states that his abdominal pain is essentially the same, having some refulx c/o.  Objective: Vital signs in last 24 hours: Temp:  [97.8 F (36.6 C)-100.6 F (38.1 C)] 99 F (37.2 C) (09/04 0555) Pulse Rate:  [68-94] 94  (09/04 0555) Resp:  [16-20] 20  (09/04 0555) BP: (109-137)/(41-68) 112/61 mmHg (09/04 0555) SpO2:  [90 %-98 %] 90 % (09/04 0555) Weight:  [187 lb 9.8 oz (85.1 kg)-188 lb 0.8 oz (85.3 kg)] 187 lb 9.8 oz (85.1 kg) (09/04 0555) Last BM Date: 10/19/11  Intake/Output from previous day: 09/03 0701 - 09/04 0700 In: 412.5 [I.V.:412.5] Out: -  Intake/Output this shift:    General appearance: alert, cooperative, appears stated age and no distress; appears juandice. Chest: CTA Cardiac:RRR Abdomen: diffusely tender +BS, no bloating, no flatus or BM. No N/V. VSS WBC slightly elevated, on Zosyn for possible cholecystitis.  Lab Results:   [facility] 10/19/11 1519  WBC 16.5*  HGB 13.1  HCT 40.3  PLT 210   BMET  Basename 10/19/11 1519  NA 139  K 3.4*  CL 98  CO2 28  GLUCOSE 123*  BUN 22  CREATININE 0.98  CALCIUM 10.3   PT/INR No results found for this basename: LABPROT:2,INR:2 in the last 72 hours ABG No results found for this basename: PHART:2,PCO2:2,PO2:2,HCO3:2 in the last 72 hours  Studies/Results: Dg Chest 2 View  10/19/2011  *RADIOLOGY REPORT*  Clinical Data: Emesis.  CHEST - 2 VIEW  Comparison: Chest x-ray 11/13/2010.  Findings: Lung volumes are normal.  No acute consolidative airspace disease.  Linear opacities at the left base, similar to prior examinations, likely represent areas of chronic scarring.  Multiple old healed bilateral rib fractures are again noted.  Pulmonary vasculature and the cardiomediastinal silhouette are within normal limits.  Left-sided pacemaker device in place with lead tips projecting over the expected location of the right atrium and right ventricular apex.  IMPRESSION: 1.  No radiographic evidence  of acute cardiopulmonary disease. 2.  Chronic scarring in the left base again noted. 3.  Multiple old healed bilateral rib fractures.   Original Report Authenticated By: Florencia Reasons, M.D.    US Abdomen Complete  10/19/2011  *RADIOLOGY REPORT*  Clinical Data:  Right upper quadrant abdominal pain with emesis.  COMPLETE ABDOMINAL ULTRASOUND  Comparison:  Abdominal CT 05/11/2011.  Abdominal ultrasound 08/30/2005.  Findings:  Gallbladder: Multiple gallstones are present.  The gallbladder is distended without wall thickening or pericholecystic fluid.  A positive sonographic Murphy's sign is reported by the sonographer.  Common bile duct:   The common bile duct is mildly dilated to 8.9 mm.  This appears new compared with the recent abdominal CT.  No ductal calculi are seen.  The distal duct is obscured by bowel gas.  Liver:  Small calcifications are present within the left hepatic lobe best seen on prior CT.  There are no focal liver lesions.  IVC:  Visualized portions appear unremarkable.  Pancreas:  Visualized portions appear unremarkable.Portions of the pancreas are obscured by bowel gas.  Spleen:  Visualized portions appear unremarkable.  Right Kidney:   The renal cortical thickness and echogenicity are preserved.  There is no hydronephrosis or focal abnormality. Renal length is 10.2 cm.  Left Kidney: There is a 2.6 centimeter cyst in the interpolar region which appears stable.  The renal cortical thickness and echogenicity are preserved.  There is no hydronephrosis or other focal abnormality. Renal length is 10.8 cm.  Abdominal aorta:  Visualized portions  appear unremarkable.  IMPRESSION:  1.  Cholelithiasis with distended gallbladder and reported sonographic Murphy's sign.  Early cholecystitis cannot be excluded, however there is no gallbladder wall thickening. 2.  Mild biliary dilatation appears new compared with the abdominal CT of 5 months ago.  Choledocholithiasis should be considered.  Of note, the  patient has a pacemaker and cannot undergo MRCP. 3.  No other acute or significant findings.   Original Report Authenticated By: Gerrianne Scale, M.D.     Anti-infectives: Anti-infectives     Start     Dose/Rate Route Frequency Ordered Stop   10/20/11 0000   piperacillin-tazobactam (ZOSYN) IVPB 3.375 g        3.375 g 12.5 mL/hr over 240 Minutes Intravenous Every 8 hours 10/19/11 2328     10/19/11 1745   piperacillin-tazobactam (ZOSYN) IVPB 3.375 g        3.375 g 12.5 mL/hr over 240 Minutes Intravenous  Once 10/19/11 1732 10/19/11 2200          Assessment/Plan: s/p * No surgery found * Patient Active Problem List  Diagnosis  . Ventral hernia, recurrent  . Anemia  . COPD (chronic obstructive pulmonary disease)  . Acute pancreatitis  . Pacemaker  . Prostate cancer   Plan: 1. Keep NPO 2. IVF, ABX. 3. He cannot have MRCP secondary to implanted pacemaker, but GI consult has been requested. 4. Added protonix for GERD 5. Mobilize 6. Follow labs 7. Re-eval for possible cholecystectomy in 2-3 days after pancreatitis improves/resolves.    LOS: 1 day    Mendi Constable 10/20/2011

## 2011-10-20 NOTE — Progress Notes (Signed)
Pt was brought up from the ED via stretcher. Pt is in no apparent distress. Pt was oriented to his room and is now in bed resting comfortably.

## 2011-10-20 NOTE — Progress Notes (Signed)
Patient O2 sats 78% patient denies SOB but was very sleepy form dose of phenergan, Patient put on [place] for 3 mins unable to recover sats WNL patient sats in upper 80's converted to  50% venti mask and sats recover to 98 % in 5 mins. MD notified at at beside to evaluate.  Patient Placed back on [place] 2L and sats are 95%

## 2011-10-20 NOTE — Progress Notes (Signed)
[facility] PROGRESS NOTE  [name] [name]. ZOX:[id number] DOB: 1924-04-07 DOA: 10/19/2011   Assessment/Plan: Patient Active Hospital Problem List: Acute pancreatitis (10/19/2011) -NPO, appreciate surgery and Gi assistance. -lipase trending down and LFT's have stabilize. -CT abdomen and pelvis pending.  Anemia (12/10/2010) -hbg 10.9. Continue to monitor  COPD (chronic obstructive pulmonary disease) (02/04/2011) -stable  Pacemaker (10/19/2011) -cannot perform MRCP  Prostate cancer (10/19/2011) -f/u as an outpatient.   Code Status: full Family Communication: patient Disposition Plan: TBD     LOS: 1 day   Procedures:    Antibiotics:    Interim History:   Subjective: Still having mild abdominal pain and nausea.  Objective: Filed Vitals:   10/19/11 1433 10/19/11 2254 10/19/11 2331 10/20/11 0555  BP: 127/41 109/53 137/68 112/61  Pulse: 71 75 68 94  Temp: 97.8 F (36.6 C) 100.6 F (38.1 C) 98.7 F (37.1 C) 99 F (37.2 C)  TempSrc: Oral Oral Oral Oral  Resp: 16  18 20   Height:   5\' 10"  (1.778 m)   Weight:   85.3 kg (188 lb 0.8 oz) 85.1 kg (187 lb 9.8 oz)  SpO2: 98% 94% 95% 90%    Intake/Output Summary (Last 24 hours) at 10/20/11 1201 Last data filed at 10/20/11 0600  Gross per 24 hour  Intake  412.5 ml  Output      0 ml  Net  412.5 ml   Weight change:   Exam:  General: Alert, awake, oriented x3, in no acute distress.  Heart: Regular rate and rhythm, without murmurs, rubs, gallops.  Lungs: Good air movement, bilateral air movement.  Abdomen: Soft,mild tenderness RUQ and epigastric Neuro: Grossly intact, nonfocal.   Data Reviewed: Basic Metabolic Panel:  Lab 10/20/11 8119 10/19/11 1519  NA 141 139  K 3.6 3.4*  CL 105 98  CO2 23 28  GLUCOSE 124* 123*  BUN 30* 22  CREATININE 1.18 0.98  CALCIUM 8.8 10.3  MG -- --  PHOS -- --   Liver Function Tests:  Lab 10/20/11 1001 10/20/11 0956 10/19/11 1519  AST 125* 124* 106*  ALT 115*  115* 115*  ALKPHOS 265* 263* 342*  BILITOT 8.3* 8.2* 3.6*  PROT 5.6* 5.5* 7.8  ALBUMIN 2.7* 2.6* 4.1    Lab 10/20/11 0956 10/19/11 1751  LIPASE 2466* >3000*  AMYLASE -- --   No results found for this basename: AMMONIA:5 in the last 168 hours CBC:  Lab 10/20/11 1001 10/19/11 1519  WBC 14.9* 16.5*  NEUTROABS 14.4* 14.8*  HGB 10.9* 13.1  HCT 33.3* 40.3  MCV 97.1 97.6  PLT 124* 210   Cardiac Enzymes:  Lab 10/19/11 2354 10/19/11 1715  CKTOTAL -- --  CKMB -- --  CKMBINDEX -- --  TROPONINI <0.30 <0.30   BNP: No components found with this basename: POCBNP:5 CBG:  Lab 10/20/11 0749 10/20/11 0412 10/20/11 0029  GLUCAP 111* 110* 128*    No results found for this or any previous visit (from the past 240 hour(s)).   Studies: Dg Chest 2 View  10/19/2011  *RADIOLOGY REPORT*  Clinical Data: Emesis.  CHEST - 2 VIEW  Comparison: Chest x-ray 11/13/2010.  Findings: Lung volumes are normal.  No acute consolidative airspace disease.  Linear opacities at the left base, similar to prior examinations, likely represent areas of chronic scarring.  Multiple old healed bilateral rib fractures are again noted.  Pulmonary vasculature and the cardiomediastinal silhouette are within normal limits.  Left-sided pacemaker device in place with lead tips projecting over the  expected location of the right atrium and right ventricular apex.  IMPRESSION: 1.  No radiographic evidence of acute cardiopulmonary disease. 2.  Chronic scarring in the left base again noted. 3.  Multiple old healed bilateral rib fractures.   Original Report Authenticated By: Florencia Reasons, M.D.    US Abdomen Complete  10/19/2011  *RADIOLOGY REPORT*  Clinical Data:  Right upper quadrant abdominal pain with emesis.  COMPLETE ABDOMINAL ULTRASOUND  Comparison:  Abdominal CT 05/11/2011.  Abdominal ultrasound 08/30/2005.  Findings:  Gallbladder: Multiple gallstones are present.  The gallbladder is distended without wall thickening or  pericholecystic fluid.  A positive sonographic Murphy's sign is reported by the sonographer.  Common bile duct:   The common bile duct is mildly dilated to 8.9 mm.  This appears new compared with the recent abdominal CT.  No ductal calculi are seen.  The distal duct is obscured by bowel gas.  Liver:  Small calcifications are present within the left hepatic lobe best seen on prior CT.  There are no focal liver lesions.  IVC:  Visualized portions appear unremarkable.  Pancreas:  Visualized portions appear unremarkable.Portions of the pancreas are obscured by bowel gas.  Spleen:  Visualized portions appear unremarkable.  Right Kidney:   The renal cortical thickness and echogenicity are preserved.  There is no hydronephrosis or focal abnormality. Renal length is 10.2 cm.  Left Kidney: There is a 2.6 centimeter cyst in the interpolar region which appears stable.  The renal cortical thickness and echogenicity are preserved.  There is no hydronephrosis or other focal abnormality. Renal length is 10.8 cm.  Abdominal aorta:  Visualized portions appear unremarkable.  IMPRESSION:  1.  Cholelithiasis with distended gallbladder and reported sonographic Murphy's sign.  Early cholecystitis cannot be excluded, however there is no gallbladder wall thickening. 2.  Mild biliary dilatation appears new compared with the abdominal CT of 5 months ago.  Choledocholithiasis should be considered.  Of note, the patient has a pacemaker and cannot undergo MRCP. 3.  No other acute or significant findings.   Original Report Authenticated By: Gerrianne Scale, M.D.     Scheduled Meds:   . sodium chloride  1,000 mL Intravenous Once  . antiseptic oral rinse  15 mL Mouth Rinse BID  . ibuprofen  600 mg Oral Once  .  morphine injection  6 mg Intravenous Once  .  morphine injection  6 mg Intravenous Once  .  morphine injection  6 mg Intravenous Once  . ondansetron (ZOFRAN) IV  4 mg Intravenous Once  . ondansetron (ZOFRAN) IV  4 mg  Intravenous Once  . pantoprazole (PROTONIX) IV  40 mg Intravenous Q12H  . piperacillin-tazobactam (ZOSYN)  IV  3.375 g Intravenous Once  . sodium chloride  3 mL Intravenous Q12H  . DISCONTD: piperacillin-tazobactam (ZOSYN)  IV  3.375 g Intravenous Q8H   Continuous Infusions:   . 0.9 % NaCl with KCl 20 mEq / L 75 mL/hr at 10/20/11 0600  . DISCONTD: sodium chloride 1,000 mL (10/19/11 2039)    Lambert Keto, MD  [facility] Pager [phone]  If 7PM-7AM, please contact night-coverage [email or web address] Password [email or web address] 10/20/2011, 12:01 PM

## 2011-10-20 NOTE — Progress Notes (Signed)
*  PRELIMINARY RESULTS* Vascular Ultrasound Lower extremity venous duplex has been completed.  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 No acute DVT noted. Left= no evidence of DVT.  Farrel Demark, RDMS, RVT  10/20/2011, 10:50 AM

## 2011-10-20 NOTE — Progress Notes (Signed)
Slightly better than yesterday.  Has moderate RUQ and epigastric pain.  Recommend NPO and IV hydration.  ABX probably not needed.  Follow lipase and LFT's and lap chole when normalizing.  GI consult for consideration of ERCP if LFT's not improving since not candidate for MRCP given pacemaker.

## 2011-10-20 NOTE — Consult Note (Signed)
Reason for Consult: Gallstone pancreatitis Referring Physician: Dr.  Reather Converse. is an 78 y.o. male.  HPI: Patient seen at the request of the primary care physician for gallstone pancreatitis and our office computer was reviewed as well as the hospital computer and this is his third bout of what he called significant indigestion over the last month or 2 however this one was worse with increased vomiting and increased abdominal pain although he is a little better today he had a previous CAT scan which showed gallstones and ultrasound confirm that and his liver tests were elevated as was his lipase and we are consulted for further workup and plans a CT is ordered but has not been done and he says he's been swallowing okay and we discussed his previous hernia surgery and his family history is negative for any gallbladder problems and he has no other complaints  Past Medical History  Diagnosis Date  . Hypertension   . Hearing loss   . Pacemaker   . Leg swelling 10/19/2011    LLE  . COPD (chronic obstructive pulmonary disease) 10/19/2011    "touch"  . Exertional dyspnea 10/19/2011  . OSA on CPAP 10/19/2011    "sometimes wear CPAP"  . Prostate cancer     S/P radiation tx ~ 2008  . History of blood transfusion     "think it was related to pacemaker placement"  . GERD (gastroesophageal reflux disease)   . Arthritis     "back"  . Chronic lower back pain     Past Surgical History  Procedure Date  . Hernia repair ~ 2010;     lap; VHR  . Hernia repair ~ 2011    lap vh complicated by recurrence  . Hernia repair 09/2011    open; VHR  . Insert / replace / remove pacemaker ~ 2009    initial placement  . Cataract extraction w/ intraocular lens  implant, bilateral   . Joint replacement   . Total knee arthroplasty 1990's    right    Family History  Problem Relation Age of Onset  . Heart disease Father   . Heart disease Mother   . Colon cancer Mother     Social History:   reports that he quit smoking about 38 years ago. His smoking use included Cigarettes. He has a 52.5 pack-year smoking history. He has never used smokeless tobacco. He reports that he does not drink alcohol or use illicit drugs.  Allergies: No Known Allergies  Medications: I have reviewed the patient's current medications.  Results for orders placed during the hospital encounter of 10/19/11 (from the past 48 hour(s))  POCT I-STAT TROPONIN I     Status: Normal   Collection Time   10/19/11  3:17 PM      Component Value Range Comment   Troponin i, poc 0.01  0.00 - 0.08 ng/mL    Comment 3            CBC WITH DIFFERENTIAL     Status: Abnormal   Collection Time   10/19/11  3:19 PM      Component Value Range Comment   WBC 16.5 (*) 4.0 - 10.5 K/uL    RBC 4.13 (*) 4.22 - 5.81 MIL/uL    Hemoglobin 13.1  13.0 - 17.0 g/dL    HCT 29.5  62.1 - 30.8 %    MCV 97.6  78.0 - 100.0 fL    MCH 31.7  26.0 - 34.0 pg  MCHC 32.5  30.0 - 36.0 g/dL    RDW 16.1  09.6 - 04.5 %    Platelets 210  150 - 400 K/uL    Neutrophils Relative 89 (*) 43 - 77 %    Neutro Abs 14.8 (*) 1.7 - 7.7 K/uL    Lymphocytes Relative 8 (*) 12 - 46 %    Lymphs Abs 1.3  0.7 - 4.0 K/uL    Monocytes Relative 2 (*) 3 - 12 %    Monocytes Absolute 0.4  0.1 - 1.0 K/uL    Eosinophils Relative 1  0 - 5 %    Eosinophils Absolute 0.1  0.0 - 0.7 K/uL    Basophils Relative 0  0 - 1 %    Basophils Absolute 0.0  0.0 - 0.1 K/uL   COMPREHENSIVE METABOLIC PANEL     Status: Abnormal   Collection Time   10/19/11  3:19 PM      Component Value Range Comment   Sodium 139  135 - 145 mEq/L    Potassium 3.4 (*) 3.5 - 5.1 mEq/L    Chloride 98  96 - 112 mEq/L    CO2 28  19 - 32 mEq/L    Glucose, Bld 123 (*) 70 - 99 mg/dL    BUN 22  6 - 23 mg/dL    Creatinine, Ser 4.09  0.50 - 1.35 mg/dL    Calcium 81.1  8.4 - 10.5 mg/dL    Total Protein 7.8  6.0 - 8.3 g/dL    Albumin 4.1  3.5 - 5.2 g/dL    AST 914 (*) 0 - 37 U/L    ALT 115 (*) 0 - 53 U/L    Alkaline  Phosphatase 342 (*) 39 - 117 U/L    Total Bilirubin 3.6 (*) 0.3 - 1.2 mg/dL    GFR calc non Af Amer 73 (*) >90 mL/min    GFR calc Af Amer 84 (*) >90 mL/min   TROPONIN I     Status: Normal   Collection Time   10/19/11  5:15 PM      Component Value Range Comment   Troponin I <0.30  <0.30 ng/mL   LIPASE, BLOOD     Status: Abnormal   Collection Time   10/19/11  5:51 PM      Component Value Range Comment   Lipase >3000 (*) 11 - 59 U/L   TROPONIN I     Status: Normal   Collection Time   10/19/11 11:54 PM      Component Value Range Comment   Troponin I <0.30  <0.30 ng/mL   GLUCOSE, CAPILLARY     Status: Abnormal   Collection Time   10/20/11 12:29 AM      Component Value Range Comment   Glucose-Capillary 128 (*) 70 - 99 mg/dL   GLUCOSE, CAPILLARY     Status: Abnormal   Collection Time   10/20/11  4:12 AM      Component Value Range Comment   Glucose-Capillary 110 (*) 70 - 99 mg/dL    Comment 1 Notify RN      Comment 2 Documented in Chart     GLUCOSE, CAPILLARY     Status: Abnormal   Collection Time   10/20/11  7:49 AM      Component Value Range Comment   Glucose-Capillary 111 (*) 70 - 99 mg/dL   CBC WITH DIFFERENTIAL     Status: Abnormal   Collection Time   10/20/11 10:01 AM  Component Value Range Comment   WBC 14.9 (*) 4.0 - 10.5 K/uL    RBC 3.43 (*) 4.22 - 5.81 MIL/uL    Hemoglobin 10.9 (*) 13.0 - 17.0 g/dL    HCT 14.7 (*) 82.9 - 52.0 %    MCV 97.1  78.0 - 100.0 fL    MCH 31.8  26.0 - 34.0 pg    MCHC 32.7  30.0 - 36.0 g/dL    RDW 56.2  13.0 - 86.5 %    Platelets 124 (*) 150 - 400 K/uL    Neutrophils Relative 97 (*) 43 - 77 %    Neutro Abs 14.4 (*) 1.7 - 7.7 K/uL    Lymphocytes Relative 2 (*) 12 - 46 %    Lymphs Abs 0.3 (*) 0.7 - 4.0 K/uL    Monocytes Relative 1 (*) 3 - 12 %    Monocytes Absolute 0.2  0.1 - 1.0 K/uL    Eosinophils Relative 0  0 - 5 %    Eosinophils Absolute 0.0  0.0 - 0.7 K/uL    Basophils Relative 0  0 - 1 %    Basophils Absolute 0.0  0.0 - 0.1 K/uL     Dg  Chest 2 View  10/19/2011  *RADIOLOGY REPORT*  Clinical Data: Emesis.  CHEST - 2 VIEW  Comparison: Chest x-ray 11/13/2010.  Findings: Lung volumes are normal.  No acute consolidative airspace disease.  Linear opacities at the left base, similar to prior examinations, likely represent areas of chronic scarring.  Multiple old healed bilateral rib fractures are again noted.  Pulmonary vasculature and the cardiomediastinal silhouette are within normal limits.  Left-sided pacemaker device in place with lead tips projecting over the expected location of the right atrium and right ventricular apex.  IMPRESSION: 1.  No radiographic evidence of acute cardiopulmonary disease. 2.  Chronic scarring in the left base again noted. 3.  Multiple old healed bilateral rib fractures.   Original Report Authenticated By: Florencia Reasons, M.D.    US Abdomen Complete  10/19/2011  *RADIOLOGY REPORT*  Clinical Data:  Right upper quadrant abdominal pain with emesis.  COMPLETE ABDOMINAL ULTRASOUND  Comparison:  Abdominal CT 05/11/2011.  Abdominal ultrasound 08/30/2005.  Findings:  Gallbladder: Multiple gallstones are present.  The gallbladder is distended without wall thickening or pericholecystic fluid.  A positive sonographic Murphy's sign is reported by the sonographer.  Common bile duct:   The common bile duct is mildly dilated to 8.9 mm.  This appears new compared with the recent abdominal CT.  No ductal calculi are seen.  The distal duct is obscured by bowel gas.  Liver:  Small calcifications are present within the left hepatic lobe best seen on prior CT.  There are no focal liver lesions.  IVC:  Visualized portions appear unremarkable.  Pancreas:  Visualized portions appear unremarkable.Portions of the pancreas are obscured by bowel gas.  Spleen:  Visualized portions appear unremarkable.  Right Kidney:   The renal cortical thickness and echogenicity are preserved.  There is no hydronephrosis or focal abnormality. Renal length is  10.2 cm.  Left Kidney: There is a 2.6 centimeter cyst in the interpolar region which appears stable.  The renal cortical thickness and echogenicity are preserved.  There is no hydronephrosis or other focal abnormality. Renal length is 10.8 cm.  Abdominal aorta:  Visualized portions appear unremarkable.  IMPRESSION:  1.  Cholelithiasis with distended gallbladder and reported sonographic Murphy's sign.  Early cholecystitis cannot be excluded, however there is no  gallbladder wall thickening. 2.  Mild biliary dilatation appears new compared with the abdominal CT of 5 months ago.  Choledocholithiasis should be considered.  Of note, the patient has a pacemaker and cannot undergo MRCP. 3.  No other acute or significant findings.   Original Report Authenticated By: Gerrianne Scale, M.D.     ROS negative except above Blood pressure 112/61, pulse 94, temperature 99 F (37.2 C), temperature source Oral, resp. rate 20, height 5\' 10"  (1.778 m), weight 85.1 kg (187 lb 9.8 oz), SpO2 90.00%. Physical Exam patient will little sleepy lying comfortable in the bed no acute distress mildly jaundiced abdomen is soft occasional bowel sounds right upper quadrant tenderness with minimal discomfort elsewhere minimal right upper quadrant guarding without rebound labs and ultrasound reviewed  Assessment/Plan: Multiple medical problems in a patient with gallstone pancreatitis Plan: The patient and his wife and I had a long conversation about laparoscopic cholecystectomy and Intra-Op cholangiogram versus ERCP if we thought a stone was present and we had a long talk about the risks benefits and methods of that including wanting to wait if possible until the pancreatitis settles down due to the risks and agree with surgical consultation and continue to follow labs and await CT to decide how to proceed  [facility] E 10/20/2011, 10:55 AM

## 2011-10-21 DIAGNOSIS — C61 Malignant neoplasm of prostate: Secondary | ICD-10-CM

## 2011-10-21 LAB — COMPREHENSIVE METABOLIC PANEL
AST: 358 U/L — ABNORMAL HIGH (ref 0–37)
Albumin: 2.5 g/dL — ABNORMAL LOW (ref 3.5–5.2)
Calcium: 9.1 mg/dL (ref 8.4–10.5)
Creatinine, Ser: 1.63 mg/dL — ABNORMAL HIGH (ref 0.50–1.35)
Total Protein: 5.8 g/dL — ABNORMAL LOW (ref 6.0–8.3)

## 2011-10-21 LAB — GLUCOSE, CAPILLARY
Glucose-Capillary: 111 mg/dL — ABNORMAL HIGH (ref 70–99)
Glucose-Capillary: 127 mg/dL — ABNORMAL HIGH (ref 70–99)
Glucose-Capillary: 109 mg/dL — ABNORMAL HIGH (ref 70–99)
Glucose-Capillary: 148 mg/dL — ABNORMAL HIGH (ref 70–99)
Glucose-Capillary: 138 mg/dL — ABNORMAL HIGH (ref 70–99)

## 2011-10-21 LAB — CBC WITH DIFFERENTIAL/PLATELET
Basophils Absolute: 0 10*3/uL (ref 0.0–0.1)
Eosinophils Absolute: 0 10*3/uL (ref 0.0–0.7)
Lymphs Abs: 0.3 10*3/uL — ABNORMAL LOW (ref 0.7–4.0)
MCH: 31.9 pg (ref 26.0–34.0)
MCHC: 33.1 g/dL (ref 30.0–36.0)
MCV: 96.2 fL (ref 78.0–100.0)
Monocytes Absolute: 0.2 10*3/uL (ref 0.1–1.0)
Monocytes Relative: 1 % — ABNORMAL LOW (ref 3–12)
Platelets: 75 10*3/uL — ABNORMAL LOW (ref 150–400)
RDW: 15.3 % (ref 11.5–15.5)
WBC Morphology: INCREASED
WBC: 16.1 10*3/uL — ABNORMAL HIGH (ref 4.0–10.5)

## 2011-10-21 LAB — SAMPLE TO BLOOD BANK

## 2011-10-21 LAB — PROTIME-INR
INR: 1.35 (ref 0.00–1.49)
Prothrombin Time: 16.9 seconds — ABNORMAL HIGH (ref 11.6–15.2)

## 2011-10-21 LAB — LIPASE, BLOOD: Lipase: 928 U/L — ABNORMAL HIGH (ref 11–59)

## 2011-10-21 MED ORDER — IOHEXOL 300 MG/ML  SOLN
100.0000 mL | Freq: Once | INTRAMUSCULAR | Status: AC | PRN
  Administered 2011-10-21: 100 mL via INTRAVENOUS

## 2011-10-21 NOTE — Progress Notes (Signed)
[facility] PROGRESS NOTE  [name] [name]. ZOX:[id number] DOB: Jul 15, 1924 DOA: 10/19/2011   Assessment/Plan: Patient Active Hospital Problem List: Acute pancreatitis (10/19/2011) -NPO, appreciate surgery and Gi assistance. - LFT's trending up. -CT abdomen and pelvis 2.2 cm stone is identified in the neck of the gallbladder with other stones associated. Markedly distended gallbladder with gallstones and pericholecystic fluid. There is associated intrahepatic biliary duct dilatation. Awaiting GI recommendations.  Anemia (12/10/2010) -hbg 11.9. -Continue to monitor  COPD (chronic obstructive pulmonary disease) (02/04/2011) -stable  Pacemaker (10/19/2011) -cannot perform MRCP  Prostate cancer (10/19/2011) -f/u as an outpatient.   Code Status: full Family Communication: patient Disposition Plan: TBD     LOS: 2 days   Procedures:    Antibiotics:    Interim History:   Subjective: Still having mild abdominal pain and nausea.  Objective: Filed Vitals:   10/20/11 1400 10/20/11 2016 10/20/11 2123 10/21/11 0636  BP: 99/61 99/57 100/53 112/63  Pulse: 75 92 75 93  Temp: 99.1 F (37.3 C) 100.2 F (37.9 C) 99.3 F (37.4 C) 98.5 F (36.9 C)  TempSrc: Oral Oral Oral Oral  Resp: 18 20 18 20   Height:      Weight:    84 kg (185 lb 3 oz)  SpO2: 92% 94% 94% 93%    Intake/Output Summary (Last 24 hours) at 10/21/11 1238 Last data filed at 10/21/11 0957  Gross per 24 hour  Intake 2088.33 ml  Output    950 ml  Net 1138.33 ml   Weight change: -1.3 kg (-2 lb 13.9 oz)  Exam:  General: Alert, awake, oriented x3, in no acute distress.  Heart: Regular rate and rhythm, without murmurs, rubs, gallops.  Lungs: Good air movement, bilateral air movement.  Abdomen: Soft,mild tenderness RUQ and epigastric Neuro: Grossly intact, nonfocal.   Data Reviewed: Basic Metabolic Panel:  Lab 10/21/11 8119 10/20/11 2148 10/20/11 0956 10/19/11 1519  NA 141 -- 141 139  K 4.0 --  3.6 --  CL 104 -- 105 98  CO2 22 -- 23 28  GLUCOSE 129* -- 124* 123*  BUN 43* -- 30* 22  CREATININE 1.63* -- 1.18 0.98  CALCIUM 9.1 -- 8.8 10.3  MG -- 1.7 -- --  PHOS -- -- -- --   Liver Function Tests:  Lab 10/21/11 0759 10/20/11 1001 10/20/11 0956 10/19/11 1519  AST 358* 125* 124* 106*  ALT 247* 115* 115* 115*  ALKPHOS 317* 265* 263* 342*  BILITOT 7.2* 8.3* 8.2* 3.6*  PROT 5.8* 5.6* 5.5* 7.8  ALBUMIN 2.5* 2.7* 2.6* 4.1    Lab 10/21/11 0759 10/20/11 0956 10/19/11 1751  LIPASE 928* 2466* >3000*  AMYLASE -- -- --   No results found for this basename: AMMONIA:5 in the last 168 hours CBC:  Lab 10/21/11 0759 10/20/11 1001 10/19/11 1519  WBC 16.1* 14.9* 16.5*  NEUTROABS 15.6* 14.4* 14.8*  HGB 11.9* 10.9* 13.1  HCT 35.9* 33.3* 40.3  MCV 96.2 97.1 97.6  PLT PENDING 124* 210   Cardiac Enzymes:  Lab 10/19/11 2354 10/19/11 1715  CKTOTAL -- --  CKMB -- --  CKMBINDEX -- --  TROPONINI <0.30 <0.30   BNP: No components found with this basename: POCBNP:5 CBG:  Lab 10/21/11 1207 10/21/11 0756 10/21/11 0446 10/21/11 0140 10/20/11 2218  GLUCAP 109* 127* 111* 110* 131*    No results found for this or any previous visit (from the past 240 hour(s)).   Studies: Dg Chest 2 View  10/19/2011  *RADIOLOGY REPORT*  Clinical Data: Emesis.  CHEST - 2 VIEW  Comparison: Chest x-ray 11/13/2010.  Findings: Lung volumes are normal.  No acute consolidative airspace disease.  Linear opacities at the left base, similar to prior examinations, likely represent areas of chronic scarring.  Multiple old healed bilateral rib fractures are again noted.  Pulmonary vasculature and the cardiomediastinal silhouette are within normal limits.  Left-sided pacemaker device in place with lead tips projecting over the expected location of the right atrium and right ventricular apex.  IMPRESSION: 1.  No radiographic evidence of acute cardiopulmonary disease. 2.  Chronic scarring in the left base again noted. 3.   Multiple old healed bilateral rib fractures.   Original Report Authenticated By: Florencia Reasons, M.D.    US Abdomen Complete  10/19/2011  *RADIOLOGY REPORT*  Clinical Data:  Right upper quadrant abdominal pain with emesis.  COMPLETE ABDOMINAL ULTRASOUND  Comparison:  Abdominal CT 05/11/2011.  Abdominal ultrasound 08/30/2005.  Findings:  Gallbladder: Multiple gallstones are present.  The gallbladder is distended without wall thickening or pericholecystic fluid.  A positive sonographic Murphy's sign is reported by the sonographer.  Common bile duct:   The common bile duct is mildly dilated to 8.9 mm.  This appears new compared with the recent abdominal CT.  No ductal calculi are seen.  The distal duct is obscured by bowel gas.  Liver:  Small calcifications are present within the left hepatic lobe best seen on prior CT.  There are no focal liver lesions.  IVC:  Visualized portions appear unremarkable.  Pancreas:  Visualized portions appear unremarkable.Portions of the pancreas are obscured by bowel gas.  Spleen:  Visualized portions appear unremarkable.  Right Kidney:   The renal cortical thickness and echogenicity are preserved.  There is no hydronephrosis or focal abnormality. Renal length is 10.2 cm.  Left Kidney: There is a 2.6 centimeter cyst in the interpolar region which appears stable.  The renal cortical thickness and echogenicity are preserved.  There is no hydronephrosis or other focal abnormality. Renal length is 10.8 cm.  Abdominal aorta:  Visualized portions appear unremarkable.  IMPRESSION:  1.  Cholelithiasis with distended gallbladder and reported sonographic Murphy's sign.  Early cholecystitis cannot be excluded, however there is no gallbladder wall thickening. 2.  Mild biliary dilatation appears new compared with the abdominal CT of 5 months ago.  Choledocholithiasis should be considered.  Of note, the patient has a pacemaker and cannot undergo MRCP. 3.  No other acute or significant findings.    Original Report Authenticated By: Gerrianne Scale, M.D.     Scheduled Meds:    . antiseptic oral rinse  15 mL Mouth Rinse BID  . iohexol  20 mL Oral Q1 Hr x 2  . pantoprazole (PROTONIX) IV  40 mg Intravenous Q12H  . piperacillin-tazobactam (ZOSYN)  IV  3.375 g Intravenous Q6H  . sodium chloride  3 mL Intravenous Q12H   Continuous Infusions:    . dextrose 5 % and 0.45 % NaCl with KCl 20 mEq/L 125 mL/hr at 10/21/11 0542  . DISCONTD: dextrose 5 % and 0.45 % NaCl with KCl 40 mEq/L 75 mL/hr at 10/20/11 1358    Lambert Keto, MD  [facility] Pager [phone]  If 7PM-7AM, please contact night-coverage [email or web address] Password [facility] 10/21/2011, 12:38 PM

## 2011-10-21 NOTE — Progress Notes (Signed)
Subjective: Slightly better but still sore.  Objective: Vital signs in last 24 hours: Temp:  [98.5 F (36.9 C)-100.2 F (37.9 C)] 98.5 F (36.9 C) (09/05 0636) Pulse Rate:  [75-93] 93  (09/05 0636) Resp:  [18-20] 20  (09/05 0636) BP: (99-112)/(53-63) 112/63 mmHg (09/05 0636) SpO2:  [92 %-94 %] 93 % (09/05 0636) Weight:  [185 lb 3 oz (84 kg)] 185 lb 3 oz (84 kg) (09/05 0636) Last BM Date: 10/19/11  Intake/Output from previous day: 09/04 0701 - 09/05 0700 In: 2088.3 [P.O.:680; I.V.:1408.3] Out: 950 [Urine:950] Intake/Output this shift:    General appearance: alert, cooperative and no distress Resp: nonlabored GI: soft, RUQ tenderness, ND, no peritoneal signs  Lab Results:   Basename 10/21/11 0759 10/20/11 1001  WBC 16.1* 14.9*  HGB 11.9* 10.9*  HCT 35.9* 33.3*  PLT PENDING 124*   BMET  Basename 10/21/11 0759 10/20/11 0956  NA 141 141  K 4.0 3.6  CL 104 105  CO2 22 23  GLUCOSE 129* 124*  BUN 43* 30*  CREATININE 1.63* 1.18  CALCIUM 9.1 8.8   PT/INR  Basename 10/21/11 0759  LABPROT 16.9*  INR 1.35   ABG No results found for this basename: PHART:2,PCO2:2,PO2:2,HCO3:2 in the last 72 hours  Studies/Results: Dg Chest 2 View  10/19/2011  *RADIOLOGY REPORT*  Clinical Data: Emesis.  CHEST - 2 VIEW  Comparison: Chest x-ray 11/13/2010.  Findings: Lung volumes are normal.  No acute consolidative airspace disease.  Linear opacities at the left base, similar to prior examinations, likely represent areas of chronic scarring.  Multiple old healed bilateral rib fractures are again noted.  Pulmonary vasculature and the cardiomediastinal silhouette are within normal limits.  Left-sided pacemaker device in place with lead tips projecting over the expected location of the right atrium and right ventricular apex.  IMPRESSION: 1.  No radiographic evidence of acute cardiopulmonary disease. 2.  Chronic scarring in the left base again noted. 3.  Multiple old healed bilateral rib  fractures.   Original Report Authenticated By: Florencia Reasons, M.D.    US Abdomen Complete  10/19/2011  *RADIOLOGY REPORT*  Clinical Data:  Right upper quadrant abdominal pain with emesis.  COMPLETE ABDOMINAL ULTRASOUND  Comparison:  Abdominal CT 05/11/2011.  Abdominal ultrasound 08/30/2005.  Findings:  Gallbladder: Multiple gallstones are present.  The gallbladder is distended without wall thickening or pericholecystic fluid.  A positive sonographic Murphy's sign is reported by the sonographer.  Common bile duct:   The common bile duct is mildly dilated to 8.9 mm.  This appears new compared with the recent abdominal CT.  No ductal calculi are seen.  The distal duct is obscured by bowel gas.  Liver:  Small calcifications are present within the left hepatic lobe best seen on prior CT.  There are no focal liver lesions.  IVC:  Visualized portions appear unremarkable.  Pancreas:  Visualized portions appear unremarkable.Portions of the pancreas are obscured by bowel gas.  Spleen:  Visualized portions appear unremarkable.  Right Kidney:   The renal cortical thickness and echogenicity are preserved.  There is no hydronephrosis or focal abnormality. Renal length is 10.2 cm.  Left Kidney: There is a 2.6 centimeter cyst in the interpolar region which appears stable.  The renal cortical thickness and echogenicity are preserved.  There is no hydronephrosis or other focal abnormality. Renal length is 10.8 cm.  Abdominal aorta:  Visualized portions appear unremarkable.  IMPRESSION:  1.  Cholelithiasis with distended gallbladder and reported sonographic Murphy's sign.  Early  cholecystitis cannot be excluded, however there is no gallbladder wall thickening. 2.  Mild biliary dilatation appears new compared with the abdominal CT of 5 months ago.  Choledocholithiasis should be considered.  Of note, the patient has a pacemaker and cannot undergo MRCP. 3.  No other acute or significant findings.   Original Report Authenticated  By: Gerrianne Scale, M.D.    Ct Abdomen Pelvis W Contrast  10/20/2011  *RADIOLOGY REPORT*  Clinical Data: Jaundice and weakness.  CT ABDOMEN AND PELVIS WITH CONTRAST  Technique:  Multidetector CT imaging of the abdomen and pelvis was performed following the standard protocol during bolus administration of intravenous contrast.  Contrast:  100 ml Omnipaque-300  Comparison: 05/11/2011  Findings: Small bilateral pleural effusions are evident.  The patient has compressive atelectasis in both lower lobes.  A small hiatal hernia is evident. Calcified granulomata are seen in the liver and spleen.  2.2 cm stone is identified in the neck of the gallbladder with other stones associated.  The gallbladder is prominently distended, measuring up to 5 cm in diameter.  There is pericholecystic fluid.  Intrahepatic biliary duct dilatation is associated although no gross dilatation of the extrahepatic common duct is evident.  The duodenum, pancreas, and adrenal glands are unremarkable.  2.2 cm cyst is identified in the interpolar left kidney.  No abdominal aortic aneurysm.  No free fluid in the abdomen.  Small lymph nodes are seen in the hepatoduodenal ligament.  Imaging through the pelvis shows a small amount of intraperitoneal free fluid.  There is no pelvic sidewall lymphadenopathy.  Bladder is unremarkable.  No colonic diverticulitis terminal ileum is unremarkable. The appendix is not visualized, but there is no edema or inflammation in the region of the cecum.  Bone windows reveal no worrisome lytic or sclerotic osseous lesions.  IMPRESSION: Markedly distended gallbladder with gallstones and pericholecystic fluid.  There is associated intrahepatic biliary duct dilatation. Acute cholecystitis is a concern given these imaging features.  Small bilateral pleural effusions with compressive atelectasis in both lower lobes.  I discussed these findings by telephone with the Hospitalist PA Vernona Rieger [place]) at 2007 hours on 10/20/2011.    Original Report Authenticated By: ERIC A. MANSELL, M.D.     Anti-infectives: Anti-infectives     Start     Dose/Rate Route Frequency Ordered Stop   10/20/11 2015   piperacillin-tazobactam (ZOSYN) IVPB 3.375 g        3.375 g 12.5 mL/hr over 240 Minutes Intravenous 4 times per day 10/20/11 2013     10/20/11 0000   piperacillin-tazobactam (ZOSYN) IVPB 3.375 g  Status:  Discontinued        3.375 g 12.5 mL/hr over 240 Minutes Intravenous Every 8 hours 10/19/11 2328 10/20/11 1159   10/19/11 1745   piperacillin-tazobactam (ZOSYN) IVPB 3.375 g        3.375 g 12.5 mL/hr over 240 Minutes Intravenous  Once 10/19/11 1732 10/19/11 2200          Assessment/Plan: s/p * No surgery found * Lipase improved but TB still elevated. Consider ERCP preop. GI following. Continue NPO and bowel rest for now.  Will need cholecystectomy soon.  LOS: 2 days    Lodema Pilot DAVID 10/21/2011

## 2011-10-21 NOTE — Progress Notes (Signed)
[name] [name]. 2:24 PM  Subjective: Patient is actually doing better with less pain and no new complaints currently legs not bothering him and in the past his left leg tended to swell more  Objective: Vital signs stable afebrile no acute distress still obviously jaundiced abdomen is softer decreased tenderness rare bowel sound CT was reviewed with radiology Dr. Eppie Gibson labs pertinent for increased white count and increased transaminases and BUN and creatinine but decreased bilirubin and lipase and of note pancreas looks fine on CT  Assessment: Gallstone pancreatitis and multiple medical problems  Plan: I had a long talk with the patient and his wife and the surgeon and it's difficult to decide whether we should proceed with a laparoscopic cholecystectomy first and Intra-Op cholangiogram versus an ERCP. I think the CT implies we should do a laparoscopic cholecystectomy first but the transaminases favor a possible ERCP first and possible CBD stone. Since he has less pain we will observe one more day and I have tentatively scheduled him for a ERCP at noon however pending surgical opinion lab work and clinical course we could proceed with laparoscopic cholecystectomy first and the risks of both approaches were rediscussed with the patient and his wife in the hospital team we'll have to decide about the chronic blood clot but hold off on anticoagulation until after above unless they feel strongly otherwise  [facility]

## 2011-10-21 NOTE — Progress Notes (Signed)
Pt was having PVCs. RN notified MD on call. RN checked on pt. Pt was asleep. Pt is in no apparent distress and stated that he feels fine. MD ordered STAT EKG. EKG showed normal sinus rhythm with frequent PVCs. RN notified MD call ([place]) about EKG results. MD stated to continue to monitor as long as pt is asymptomatic.

## 2011-10-21 NOTE — Progress Notes (Signed)
Physical Therapy Treatment Patient Details Name: [name] [name]. MRN: [id number] DOB: [birth year] Today's Date: 10/21/2011 Time: 7829-5621 PT Time Calculation (min): 23 min  PT Assessment / Plan / Recommendation Comments on Treatment Session  Pt admitted with pancreatitis and progressing with mobility but limited with balance and ability to use DME. Pt educated for safety and sequence throughout. Pt encouraged to increase mobility with nursing staff. Will continue to follow.    Follow Up Recommendations       Barriers to Discharge        Equipment Recommendations       Recommendations for Other Services    Frequency     Plan Discharge plan remains appropriate;Frequency remains appropriate    Precautions / Restrictions Precautions Precautions: Fall Restrictions Weight Bearing Restrictions: No   Pertinent Vitals/Pain No pain sats 94% on 2L at rest with HR 100 At end of ambulation sats 98% and HR 121    Mobility  Bed Mobility Bed Mobility: Sit to Supine Sit to Supine: 4: Min assist;HOB flat;With rail Details for Bed Mobility Assistance: assist to elevate bil LE onto surface with cueing for sequence and safety Transfers Sit to Stand: 4: Min assist;From chair/3-in-1 Stand to Sit: 4: Min guard;To bed Details for Transfer Assistance: cueing for hand placement, assist for anterior translation and assist to control descent to bed Ambulation/Gait Ambulation/Gait Assistance: 4: Min assist Ambulation Distance (Feet): 300 Feet Assistive device: Rolling walker Ambulation/Gait Assistance Details: constant cueing and assist to step into RW, pt maintains flexed posture with self too far posterior to RW despite assist and cueing with increased assist for turns  Gait Pattern: Trunk flexed;Narrow base of support;Step-through pattern;Decreased stride length Gait velocity: decreased Stairs: No    Exercises     PT Diagnosis:    PT Problem List:   PT Treatment Interventions:     PT  Goals Acute Rehab PT Goals PT Goal: Sit to Stand - Progress: Progressing toward goal PT Goal: Stand to Sit - Progress: Progressing toward goal PT Goal: Ambulate - Progress: Progressing toward goal  Visit Information  Last PT Received On: 10/21/11 Assistance Needed: +1    Subjective Data  Subjective: I'm a litte drugged   Cognition  Overall Cognitive Status: Appears within functional limits for tasks assessed/performed Arousal/Alertness: Awake/alert Orientation Level: Appears intact for tasks assessed Behavior During Session: [facility] for tasks performed    Balance     End of Session PT - End of Session Equipment Utilized During Treatment: Gait belt Activity Tolerance: Patient tolerated treatment well Patient left: in bed;with call bell/phone within reach;with family/visitor present Nurse Communication: Mobility status   GP     Toney Sang Beth 10/21/2011, 2:26 PM Delaney Meigs, PT [phone]

## 2011-10-22 ENCOUNTER — Inpatient Hospital Stay (HOSPITAL_COMMUNITY): Payer: Medicare Other

## 2011-10-22 ENCOUNTER — Encounter (HOSPITAL_COMMUNITY)
Admission: EM | Disposition: A | Discharge status: Home / Self Care | Payer: Self-pay | Source: Home / Self Care | Attending: Internal Medicine

## 2011-10-22 DIAGNOSIS — I825Z9 Chronic embolism and thrombosis of unspecified deep veins of unspecified distal lower extremity: Secondary | ICD-10-CM | POA: Diagnosis present

## 2011-10-22 DIAGNOSIS — I4891 Unspecified atrial fibrillation: Secondary | ICD-10-CM | POA: Diagnosis present

## 2011-10-22 LAB — CBC WITH DIFFERENTIAL/PLATELET
Basophils Absolute: 0 10*3/uL (ref 0.0–0.1)
Basophils Relative: 0 % (ref 0–1)
MCHC: 32.6 g/dL (ref 30.0–36.0)
Neutro Abs: 17.9 10*3/uL — ABNORMAL HIGH (ref 1.7–7.7)
Neutrophils Relative %: 94 % — ABNORMAL HIGH (ref 43–77)
RDW: 15.3 % (ref 11.5–15.5)

## 2011-10-22 LAB — COMPREHENSIVE METABOLIC PANEL
Albumin: 2.2 g/dL — ABNORMAL LOW (ref 3.5–5.2)
Alkaline Phosphatase: 286 U/L — ABNORMAL HIGH (ref 39–117)
BUN: 49 mg/dL — ABNORMAL HIGH (ref 6–23)
Calcium: 8.8 mg/dL (ref 8.4–10.5)
Potassium: 3.8 mEq/L (ref 3.5–5.1)
Sodium: 141 mEq/L (ref 135–145)
Total Protein: 5.6 g/dL — ABNORMAL LOW (ref 6.0–8.3)

## 2011-10-22 LAB — GLUCOSE, CAPILLARY
Glucose-Capillary: 122 mg/dL — ABNORMAL HIGH (ref 70–99)
Glucose-Capillary: 112 mg/dL — ABNORMAL HIGH (ref 70–99)
Glucose-Capillary: 121 mg/dL — ABNORMAL HIGH (ref 70–99)
Glucose-Capillary: 100 mg/dL — ABNORMAL HIGH (ref 70–99)

## 2011-10-22 SURGERY — ERCP, WITH INTERVENTION IF INDICATED
Anesthesia: General

## 2011-10-22 MED ORDER — METOPROLOL TARTRATE 1 MG/ML IV SOLN
5.0000 mg | Freq: Three times a day (TID) | INTRAVENOUS | Status: DC
  Administered 2011-10-22 – 2011-10-25 (×10): 5 mg via INTRAVENOUS
  Filled 2011-10-22 (×12): qty 5

## 2011-10-22 MED ORDER — DILTIAZEM HCL 100 MG IV SOLR
5.0000 mg/h | INTRAVENOUS | Status: DC
  Administered 2011-10-22 (×2): 5 mg/h via INTRAVENOUS
  Filled 2011-10-22: qty 100

## 2011-10-22 MED ORDER — TECHNETIUM TO 99M ALBUMIN AGGREGATED
3.0000 | Freq: Once | INTRAVENOUS | Status: AC | PRN
  Administered 2011-10-22: 3 via INTRAVENOUS

## 2011-10-22 MED ORDER — DILTIAZEM LOAD VIA INFUSION
10.0000 mg | Freq: Once | INTRAVENOUS | Status: AC
  Administered 2011-10-22: 10 mg via INTRAVENOUS
  Filled 2011-10-22: qty 10

## 2011-10-22 NOTE — Progress Notes (Signed)
[facility] progress note. Chief complaint. Tachycardia. History of present illness. This 78 year old male in hospital with acute pancreatitis. Has been monitored on telemetry and nursing now noted the patient tachycardic in the 1:30 to 140 range. I was called to the bedside and found the patient sleeping. A 12-lead EKG was obtained and this indicates atrial fibrillation with rapid ventricular response. Patient denies chest pain or dyspnea. Denies any prior history of cardiac arrhythmia. His only prior cardiac history is hypertension. Vital signs. Temperature 98.4, pulse 130-140, respiration 20, blood pressure 134/86. O2 sats 95%. General appearance. Frail elderly male in no distress. Alert, oriented and cooperative. Cardiac. Irregular and tachycardic. No jugular venous distention or edema. Lungs. Breath sounds clear and equal. Abdomen. Soft with positive bowel sounds. Impression/plan. Problem #1. Atrial fib with rapid ventricular response. The patient with no complaints of chest pain or dyspnea. I will move the patient to cardiac telemetry and initiate a Cardizem drip titrated to keep pulse 65-105 range. Will give a 10 mg Cardizem bolus prior to initiating the drip. Will defer any decisions regarding anti-coagulation to the rounding physician.

## 2011-10-22 NOTE — Progress Notes (Signed)
GI addendum had a long talk with wife about the reasons for surgery first and drew her a picture and answered all of her questions and are awaiting the VQ scan and I discussed his case with Dr. Biagio Quint who agrees with the plan and we will be on standby and await findings at the time of surgery and Intra-Op cholangiogram

## 2011-10-22 NOTE — Progress Notes (Signed)
PT Cancellation Note  Treatment cancelled today due to patient receiving procedure or test.  Will follow along as able.  Yiselle Babich M 10/22/2011, 11:40 AM  10/22/2011 Cephus Shelling, PT, DPT [phone]

## 2011-10-22 NOTE — Progress Notes (Signed)
Patient ID: [name] [name]., male   DOB: 09/11/1924, 78 y.o.   MRN: [id number]    Subjective: Resting quietly awaiting surgery, still tender. Had brief epsode of "a-fib" per nurse now on metoproilol and Cardizem per medicine ([name] [name]) with [facility].  Objective: Vital signs in last 24 hours: Temp:  [98.4 F (36.9 C)-98.9 F (37.2 C)] 98.4 F (36.9 C) (09/06 0500) Pulse Rate:  [76-118] 118  (09/06 0500) Resp:  [18-20] 18  (09/06 0500) BP: (93-134)/(55-73) 134/73 mmHg (09/06 0500) SpO2:  [93 %-98 %] 97 % (09/06 0500) Weight:  [185 lb 6.5 oz (84.1 kg)] 185 lb 6.5 oz (84.1 kg) (09/06 0500) Last BM Date: 10/19/11  Intake/Output from previous day: 09/05 0701 - 09/06 0700 In: 1577.1 [I.V.:1577.1] Out: 700 [Urine:700] Intake/Output this shift: Total I/O In: -  Out: 150 [Urine:150]  General appearance: A/A/O Chest: RRR, no M/R/G Abdomen: remains tender, no N/V. + BS, flatus. Remains jaundice in appearance. WBC trending upward, VSS, afebrile  Lab Results:   Basename 10/22/11 0520 10/21/11 0759  WBC 19.0* 16.1*  HGB 10.6* 11.9*  HCT 32.5* 35.9*  PLT 61* 75*   BMET  Basename 10/22/11 0520 10/21/11 0759  NA 141 141  K 3.8 4.0  CL 106 104  CO2 24 22  GLUCOSE 133* 129*  BUN 49* 43*  CREATININE 1.62* 1.63*  CALCIUM 8.8 9.1   PT/INR  Basename 10/21/11 0759  LABPROT 16.9*  INR 1.35   ABG No results found for this basename: PHART:2,PCO2:2,PO2:2,HCO3:2 in the last 72 hours  Studies/Results: Ct Abdomen Pelvis W Contrast  10/20/2011  *RADIOLOGY REPORT*  Clinical Data: Jaundice and weakness.  CT ABDOMEN AND PELVIS WITH CONTRAST  Technique:  Multidetector CT imaging of the abdomen and pelvis was performed following the standard protocol during bolus administration of intravenous contrast.  Contrast:  100 ml Omnipaque-300  Comparison: 05/11/2011  Findings: Small bilateral pleural effusions are evident.  The patient has compressive atelectasis in both lower lobes.  A  small hiatal hernia is evident. Calcified granulomata are seen in the liver and spleen.  2.2 cm stone is identified in the neck of the gallbladder with other stones associated.  The gallbladder is prominently distended, measuring up to 5 cm in diameter.  There is pericholecystic fluid.  Intrahepatic biliary duct dilatation is associated although no gross dilatation of the extrahepatic common duct is evident.  The duodenum, pancreas, and adrenal glands are unremarkable.  2.2 cm cyst is identified in the interpolar left kidney.  No abdominal aortic aneurysm.  No free fluid in the abdomen.  Small lymph nodes are seen in the hepatoduodenal ligament.  Imaging through the pelvis shows a small amount of intraperitoneal free fluid.  There is no pelvic sidewall lymphadenopathy.  Bladder is unremarkable.  No colonic diverticulitis terminal ileum is unremarkable. The appendix is not visualized, but there is no edema or inflammation in the region of the cecum.  Bone windows reveal no worrisome lytic or sclerotic osseous lesions.  IMPRESSION: Markedly distended gallbladder with gallstones and pericholecystic fluid.  There is associated intrahepatic biliary duct dilatation. Acute cholecystitis is a concern given these imaging features.  Small bilateral pleural effusions with compressive atelectasis in both lower lobes.  I discussed these findings by telephone with the Hospitalist PA ([name] [name]) at 2007 hours on 10/20/2011.   Original Report Authenticated By: [name] [name], M.D.     Anti-infectives: Anti-infectives     Start     Dose/Rate Route Frequency Ordered Stop     10/20/11 2015   piperacillin-tazobactam (ZOSYN) IVPB 3.375 g        3.375 g 12.5 mL/hr over 240 Minutes Intravenous 4 times per day 10/20/11 2013     10/20/11 0000   piperacillin-tazobactam (ZOSYN) IVPB 3.375 g  Status:  Discontinued        3.375 g 12.5 mL/hr over 240 Minutes Intravenous Every 8 hours 10/19/11 2328 10/20/11 1159   10/19/11 1745    piperacillin-tazobactam (ZOSYN) IVPB 3.375 g        3.375 g 12.5 mL/hr over 240 Minutes Intravenous  Once 10/19/11 1732 10/19/11 2200          Assessment/Plan:  s/p Procedure(s) (LRB) with comments: ENDOSCOPIC RETROGRADE CHOLANGIOPANCREATOGRAPHY (ERCP) (N/A) Lipase improved (67) TB still elevated.(3.2) GI following.  Continue NPO and bowel rest for now.   Plan is for cholecystectomy today   LOS: 3 days    [name] [name] 10/22/2011 Not much change from prior.  Wbc up and LFT's improving.  I discussed his case with [name] [name] and we are planning on lap chole first, however, due to emergent surgery add-ons he has been pushed back.  He is next in line for OR. 

## 2011-10-22 NOTE — Progress Notes (Signed)
Rhona Leavens Ninfa Linden. 7:46 AM  Subjective: Patient's abdomen is better with decrease pain and passing gas and he has no chest pain or other complaints events of last night noted  Objective: Currently vital signs stable afebrile no acute distress abdomen is soft decreased tenderness positive bowel sounds no guarding or rebound labs pertinent for a lipase down to 67 decreased transaminases and bilirubin worrisome  increased white count and BUN despite increasing fluid and decreased platelet count  Assessment: I believe the patient has acute cholecystitis and not cholangitis  Plan: I will try to reach the surgeon to discuss how to proceed with either an ERCP today at noon as scheduled or proceeding with a laparoscopic cholecystectomy first. We might consider an endoscopic ultrasound first but even if a stone is seen but that would still not tell us whether he has cholangitis or not Travius Crochet E

## 2011-10-22 NOTE — Progress Notes (Addendum)
[facility] PROGRESS NOTE  Lendell Caprice. YQM:[id number] DOB: 1924-07-18 DOA: 10/19/2011   Assessment/Plan: Patient Active Hospital Problem List: Acute pancreatitis (10/19/2011) -NPO, appreciate surgery and Gi assistance. - LFT's trending down. -CT abdomen and pelvis 2.2 cm stone is identified in the neck of the gallbladder with other stones associated. Markedly distended gallbladder with gallstones and pericholecystic fluid. There is associated intrahepatic biliary duct dilatation.  -? ERCP vs lap choli   Atrial Fib: -rate controlled on Diltiazem. -transfer to cardiac telemetry. -most likely cause for his afib is stress 2/2 the acute cholecystitis.  Chronic distal lower extremity DVT: -Chronic, will need 160 mg as an outpatient as this has showed some benefit. At this time no anticoagulation is needed. Was discuss with hematology. -check VQ scan as he develop afib -pt denies CP or SOB  Anemia (12/10/2010) -hbg 11.9. -Continue to monitor  COPD (chronic obstructive pulmonary disease) (02/04/2011) -stable. -unlabored breathing  Pacemaker (10/19/2011) -cannot perform MRCP  Prostate cancer (10/19/2011) -f/u as an outpatient.   Code Status: full Family Communication: patient Disposition Plan: TBD     LOS: 3 days   Procedures:  ERCP  Antibiotics:  zosyn  Interim History:   Subjective: Still having mild abdominal pain and nausea. Had episode of a fib with RVR overnight  Objective: Filed Vitals:   10/21/11 1408 10/21/11 2022 10/22/11 0410 10/22/11 0500  BP: 93/55 118/70 134/68 134/73  Pulse: 95 76 82 118  Temp: 98.5 F (36.9 C) 98.9 F (37.2 C) 98.4 F (36.9 C) 98.4 F (36.9 C)  TempSrc: Oral Oral Oral   Resp: 20 18 20 18   Height:      Weight:    84.1 kg (185 lb 6.5 oz)  SpO2: 98% 93% 95% 97%    Intake/Output Summary (Last 24 hours) at 10/22/11 0731 Last data filed at 10/22/11 0408  Gross per 24 hour  Intake 1577.08 ml  Output    700 ml    Net 877.08 ml   Weight change: 0.1 kg (3.5 oz)  Exam:  General: Alert, awake, oriented x3, in no acute distress.  Heart: irregular rate and rhythm, without murmurs, rubs, gallops.  Lungs: Good air movement, bilateral air movement.  Abdomen: Soft,tender abdomen Neuro: Grossly intact, nonfocal.   Data Reviewed: Basic Metabolic Panel:  Lab 10/22/11 5284 10/21/11 0759 10/20/11 2148 10/20/11 0956 10/19/11 1519  NA 141 141 -- 141 139  K 3.8 4.0 -- -- --  CL 106 104 -- 105 98  CO2 24 22 -- 23 28  GLUCOSE 133* 129* -- 124* 123*  BUN 49* 43* -- 30* 22  CREATININE 1.62* 1.63* -- 1.18 0.98  CALCIUM 8.8 9.1 -- 8.8 10.3  MG -- -- 1.7 -- --  PHOS -- -- -- -- --   Liver Function Tests:  Lab 10/22/11 0520 10/21/11 0759 10/20/11 1001 10/20/11 0956 10/19/11 1519  AST 224* 358* 125* 124* 106*  ALT 201* 247* 115* 115* 115*  ALKPHOS 286* 317* 265* 263* 342*  BILITOT 3.2* 7.2* 8.3* 8.2* 3.6*  PROT 5.6* 5.8* 5.6* 5.5* 7.8  ALBUMIN 2.2* 2.5* 2.7* 2.6* 4.1    Lab 10/22/11 0520 10/21/11 0759 10/20/11 0956 10/19/11 1751  LIPASE 67* 928* 2466* >3000*  AMYLASE -- -- -- --   No results found for this basename: AMMONIA:5 in the last 168 hours CBC:  Lab 10/22/11 0520 10/21/11 0759 10/20/11 1001 10/19/11 1519  WBC 19.0* 16.1* 14.9* 16.5*  NEUTROABS 17.9* 15.6* 14.4* 14.8*  HGB 10.6* 11.9* 10.9*  13.1  HCT 32.5* 35.9* 33.3* 40.3  MCV 95.6 96.2 97.1 97.6  PLT 61* 75* 124* 210   Cardiac Enzymes:  Lab 10/19/11 2354 10/19/11 1715  CKTOTAL -- --  CKMB -- --  CKMBINDEX -- --  TROPONINI <0.30 <0.30   BNP: No components found with this basename: POCBNP:5 CBG:  Lab 10/22/11 0426 10/22/11 0007 10/21/11 2020 10/21/11 1654 10/21/11 1207  GLUCAP 122* 108* 138* 148* 109*    No results found for this or any previous visit (from the past 240 hour(s)).   Studies: Dg Chest 2 View  10/19/2011  *RADIOLOGY REPORT*  Clinical Data: Emesis.  CHEST - 2 VIEW  Comparison: Chest x-ray 11/13/2010.   Findings: Lung volumes are normal.  No acute consolidative airspace disease.  Linear opacities at the left base, similar to prior examinations, likely represent areas of chronic scarring.  Multiple old healed bilateral rib fractures are again noted.  Pulmonary vasculature and the cardiomediastinal silhouette are within normal limits.  Left-sided pacemaker device in place with lead tips projecting over the expected location of the right atrium and right ventricular apex.  IMPRESSION: 1.  No radiographic evidence of acute cardiopulmonary disease. 2.  Chronic scarring in the left base again noted. 3.  Multiple old healed bilateral rib fractures.   Original Report Authenticated By: Florencia Reasons, M.D.    US Abdomen Complete  10/19/2011  *RADIOLOGY REPORT*  Clinical Data:  Right upper quadrant abdominal pain with emesis.  COMPLETE ABDOMINAL ULTRASOUND  Comparison:  Abdominal CT 05/11/2011.  Abdominal ultrasound 08/30/2005.  Findings:  Gallbladder: Multiple gallstones are present.  The gallbladder is distended without wall thickening or pericholecystic fluid.  A positive sonographic Murphy's sign is reported by the sonographer.  Common bile duct:   The common bile duct is mildly dilated to 8.9 mm.  This appears new compared with the recent abdominal CT.  No ductal calculi are seen.  The distal duct is obscured by bowel gas.  Liver:  Small calcifications are present within the left hepatic lobe best seen on prior CT.  There are no focal liver lesions.  IVC:  Visualized portions appear unremarkable.  Pancreas:  Visualized portions appear unremarkable.Portions of the pancreas are obscured by bowel gas.  Spleen:  Visualized portions appear unremarkable.  Right Kidney:   The renal cortical thickness and echogenicity are preserved.  There is no hydronephrosis or focal abnormality. Renal length is 10.2 cm.  Left Kidney: There is a 2.6 centimeter cyst in the interpolar region which appears stable.  The renal cortical  thickness and echogenicity are preserved.  There is no hydronephrosis or other focal abnormality. Renal length is 10.8 cm.  Abdominal aorta:  Visualized portions appear unremarkable.  IMPRESSION:  1.  Cholelithiasis with distended gallbladder and reported sonographic Murphy's sign.  Early cholecystitis cannot be excluded, however there is no gallbladder wall thickening. 2.  Mild biliary dilatation appears new compared with the abdominal CT of 5 months ago.  Choledocholithiasis should be considered.  Of note, the patient has a pacemaker and cannot undergo MRCP. 3.  No other acute or significant findings.   Original Report Authenticated By: Gerrianne Scale, M.D.     Scheduled Meds:    . antiseptic oral rinse  15 mL Mouth Rinse BID  . diltiazem  10 mg Intravenous Once  . pantoprazole (PROTONIX) IV  40 mg Intravenous Q12H  . piperacillin-tazobactam (ZOSYN)  IV  3.375 g Intravenous Q6H  . sodium chloride  3 mL Intravenous Q12H  Continuous Infusions:    . dextrose 5 % and 0.45 % NaCl with KCl 20 mEq/L 125 mL/hr at 10/22/11 0222  . diltiazem (CARDIZEM) infusion 5 mg/hr (10/22/11 1610)    Lambert Keto, MD  [facility] Pager [phone]  If 7PM-7AM, please contact night-coverage [email or web address] Password [email or web address] 10/22/2011, 7:31 AM

## 2011-10-22 NOTE — Clinical Social Work Psychosocial (Signed)
     Clinical Social Work Department BRIEF PSYCHOSOCIAL ASSESSMENT 10/22/2011  Patient:  [name] [name], [name] [name]     Account Number:  [id number]     Admit date:  10/19/2011  Clinical Social Worker:  Doree Albee  Date/Time:  10/22/2011 03:13 PM  Referred by:  CSW  Date Referred:  10/22/2011 Referred for  Advanced Directives   Other Referral:   Interview type:  Patient Other interview type:    PSYCHOSOCIAL DATA Living Status:  FAMILY Admitted from facility:   Level of care:   Primary support name:  [name] Primary support relationship to patient:  SPOUSE Degree of support available:   strong    CURRENT CONCERNS Current Concerns  Adjustment to Illness   Other Concerns:    SOCIAL WORK ASSESSMENT / PLAN CSW met with pt and pt spouse at pt bedside to discuss the advance directives and explain the role of HCPOA and living wil. Pt did not seem to remember requesting Advanced Directive. CSW explained that pt was asked if he had an advanced directive when admitted, and if not, then I would have automatically recieved a referral. Pt did not seem interested at this time. CSW offered to discuss now or could leave copy for pt to review. Pt agreed to review when pt was ready.    CSW explained that they could complete but not to sign or date anything unless present in front of a notary. Pt agreed to contact staff if pt had questions or ready tocomplete while in the hosptial. pt and pt spouse verablized understanding of following up with community notary if unable to complete during this hosptialization.   Assessment/plan status:  No Further Intervention Required Other assessment/ plan:   Information/referral to community resources:   Advanaced Directives    PATIENTS/FAMILYS RESPONSE TO PLAN OF CARE: Pt and pt spouse thanked csw for concern and support. Pt plans to notify staff if pt has questions or wishes to complete adv. directive during this hosptailization. Pt plans to  follow up with community notary if pt desires to complete advanced directives once pt returns to the community.

## 2011-10-23 ENCOUNTER — Inpatient Hospital Stay (HOSPITAL_COMMUNITY): Payer: Medicare Other

## 2011-10-23 ENCOUNTER — Encounter (HOSPITAL_COMMUNITY): Payer: Self-pay | Admitting: Certified Registered"

## 2011-10-23 ENCOUNTER — Encounter (HOSPITAL_COMMUNITY)
Admission: EM | Disposition: A | Discharge status: Home / Self Care | Payer: Self-pay | Source: Home / Self Care | Attending: Internal Medicine

## 2011-10-23 ENCOUNTER — Inpatient Hospital Stay (HOSPITAL_COMMUNITY): Payer: Medicare Other | Admitting: Certified Registered"

## 2011-10-23 DIAGNOSIS — I825Z9 Chronic embolism and thrombosis of unspecified deep veins of unspecified distal lower extremity: Secondary | ICD-10-CM

## 2011-10-23 DIAGNOSIS — I4891 Unspecified atrial fibrillation: Secondary | ICD-10-CM

## 2011-10-23 DIAGNOSIS — K851 Biliary acute pancreatitis without necrosis or infection: Secondary | ICD-10-CM | POA: Diagnosis present

## 2011-10-23 DIAGNOSIS — K8066 Calculus of gallbladder and bile duct with acute and chronic cholecystitis without obstruction: Secondary | ICD-10-CM

## 2011-10-23 HISTORY — PX: CHOLECYSTECTOMY: SHX55

## 2011-10-23 LAB — GLUCOSE, CAPILLARY
Glucose-Capillary: 113 mg/dL — ABNORMAL HIGH (ref 70–99)
Glucose-Capillary: 116 mg/dL — ABNORMAL HIGH (ref 70–99)
Glucose-Capillary: 98 mg/dL (ref 70–99)

## 2011-10-23 SURGERY — LAPAROSCOPIC CHOLECYSTECTOMY WITH INTRAOPERATIVE CHOLANGIOGRAM
Anesthesia: General | Site: Abdomen

## 2011-10-23 MED ORDER — NALOXONE HCL 0.4 MG/ML IJ SOLN: 0.4000 mg | INTRAMUSCULAR | Status: DC | PRN

## 2011-10-23 MED ORDER — ONDANSETRON HCL 4 MG/2ML IJ SOLN
INTRAMUSCULAR | Status: DC | PRN
  Administered 2011-10-23: 4 mg via INTRAVENOUS

## 2011-10-23 MED ORDER — SODIUM CHLORIDE 0.9 % IJ SOLN: 9.0000 mL | INTRAMUSCULAR | Status: DC | PRN

## 2011-10-23 MED ORDER — BUPIVACAINE-EPINEPHRINE 0.25% -1:200000 IJ SOLN
INTRAMUSCULAR | Status: DC | PRN
  Administered 2011-10-23: 10 mL

## 2011-10-23 MED ORDER — MIDAZOLAM HCL 2 MG/2ML IJ SOLN: 1.0000 mg | INTRAMUSCULAR | Status: DC | PRN

## 2011-10-23 MED ORDER — ROCURONIUM BROMIDE 100 MG/10ML IV SOLN
INTRAVENOUS | Status: DC | PRN
  Administered 2011-10-23: 40 mg via INTRAVENOUS
  Administered 2011-10-23: 10 mg via INTRAVENOUS

## 2011-10-23 MED ORDER — PROMETHAZINE HCL 25 MG/ML IJ SOLN
6.2500 mg | INTRAMUSCULAR | Status: DC | PRN
  Filled 2011-10-23: qty 1

## 2011-10-23 MED ORDER — DIPHENHYDRAMINE HCL 50 MG/ML IJ SOLN: 12.5000 mg | Freq: Four times a day (QID) | INTRAMUSCULAR | Status: DC | PRN

## 2011-10-23 MED ORDER — DEXAMETHASONE SODIUM PHOSPHATE 4 MG/ML IJ SOLN
INTRAMUSCULAR | Status: DC | PRN
  Administered 2011-10-23: 8 mg via INTRAVENOUS

## 2011-10-23 MED ORDER — DIPHENHYDRAMINE HCL 12.5 MG/5ML PO ELIX
12.5000 mg | ORAL_SOLUTION | Freq: Four times a day (QID) | ORAL | Status: DC | PRN
  Filled 2011-10-23: qty 5

## 2011-10-23 MED ORDER — FENTANYL CITRATE 0.05 MG/ML IJ SOLN: 50.0000 ug | INTRAMUSCULAR | Status: DC | PRN

## 2011-10-23 MED ORDER — MORPHINE SULFATE (PF) 1 MG/ML IV SOLN
INTRAVENOUS | Status: AC
  Filled 2011-10-23: qty 25

## 2011-10-23 MED ORDER — SODIUM CHLORIDE 0.9 % IV SOLN
INTRAVENOUS | Status: DC | PRN
  Administered 2011-10-23 (×2)

## 2011-10-23 MED ORDER — PROPOFOL 10 MG/ML IV EMUL
INTRAVENOUS | Status: DC | PRN
  Administered 2011-10-23: 120 mg via INTRAVENOUS

## 2011-10-23 MED ORDER — ONDANSETRON HCL 4 MG/2ML IJ SOLN: 4.0000 mg | Freq: Four times a day (QID) | INTRAMUSCULAR | Status: DC | PRN

## 2011-10-23 MED ORDER — FENTANYL CITRATE 0.05 MG/ML IJ SOLN
INTRAMUSCULAR | Status: DC | PRN
  Administered 2011-10-23 (×5): 50 ug via INTRAVENOUS

## 2011-10-23 MED ORDER — LACTATED RINGERS IV SOLN
INTRAVENOUS | Status: DC | PRN
  Administered 2011-10-23 (×2): via INTRAVENOUS

## 2011-10-23 MED ORDER — MORPHINE SULFATE (PF) 1 MG/ML IV SOLN
INTRAVENOUS | Status: DC
  Administered 2011-10-23: 7 mg via INTRAVENOUS
  Administered 2011-10-23: 2 mg via INTRAVENOUS
  Administered 2011-10-23: 11:00:00 via INTRAVENOUS
  Administered 2011-10-23: 1 mg via INTRAVENOUS
  Administered 2011-10-24: 2 mg via INTRAVENOUS

## 2011-10-23 MED ORDER — SODIUM CHLORIDE 0.9 % IR SOLN
Status: DC | PRN
  Administered 2011-10-23: 1000 mL

## 2011-10-23 MED ORDER — GLYCOPYRROLATE 0.2 MG/ML IJ SOLN
INTRAMUSCULAR | Status: DC | PRN
  Administered 2011-10-23: 0.6 mg via INTRAVENOUS

## 2011-10-23 MED ORDER — HYDROMORPHONE HCL PF 1 MG/ML IJ SOLN: 0.2500 mg | INTRAMUSCULAR | Status: DC | PRN

## 2011-10-23 MED ORDER — NEOSTIGMINE METHYLSULFATE 1 MG/ML IJ SOLN
INTRAMUSCULAR | Status: DC | PRN
  Administered 2011-10-23: 4 mg via INTRAVENOUS

## 2011-10-23 MED ORDER — LIDOCAINE HCL (CARDIAC) 20 MG/ML IV SOLN
INTRAVENOUS | Status: DC | PRN
  Administered 2011-10-23: 60 mg via INTRAVENOUS

## 2011-10-23 SURGICAL SUPPLY — 61 items
ADH SKN CLS APL DERMABOND .7 (GAUZE/BANDAGES/DRESSINGS) ×1
APPLIER CLIP 5 13 M/L LIGAMAX5 (MISCELLANEOUS) ×2
APR CLP MED LRG 5 ANG JAW (MISCELLANEOUS) ×1
BAG SPEC RTRVL LRG 6X4 10 (ENDOMECHANICALS) ×1
BLADE SURG ROTATE 9660 (MISCELLANEOUS) IMPLANT
CANISTER SUCTION 2500CC (MISCELLANEOUS) ×2 IMPLANT
CHLORAPREP W/TINT 26ML (MISCELLANEOUS) ×2 IMPLANT
CHOLANGIOGRAM CATH TAUT (CATHETERS) ×1 IMPLANT
CLIP APPLIE 5 13 M/L LIGAMAX5 (MISCELLANEOUS) ×1 IMPLANT
CLOTH BEACON ORANGE TIMEOUT ST (SAFETY) ×2 IMPLANT
COVER MAYO STAND STRL (DRAPES) ×2 IMPLANT
COVER SURGICAL LIGHT HANDLE (MISCELLANEOUS) ×2 IMPLANT
DECANTER SPIKE VIAL GLASS SM (MISCELLANEOUS) ×4 IMPLANT
DERMABOND ADVANCED (GAUZE/BANDAGES/DRESSINGS) ×1
DERMABOND ADVANCED .7 DNX12 (GAUZE/BANDAGES/DRESSINGS) ×1 IMPLANT
DRAIN CHANNEL 19F RND (DRAIN) ×1 IMPLANT
DRAPE C-ARM 42X72 X-RAY (DRAPES) ×2 IMPLANT
ELECT BLADE 4.0 EZ CLEAN MEGAD (MISCELLANEOUS) ×2
ELECT REM PT RETURN 9FT ADLT (ELECTROSURGICAL) ×2
ELECTRODE BLDE 4.0 EZ CLN MEGD (MISCELLANEOUS) IMPLANT
ELECTRODE REM PT RTRN 9FT ADLT (ELECTROSURGICAL) ×1 IMPLANT
EVACUATOR SILICONE 100CC (DRAIN) ×1 IMPLANT
GLOVE BIO SURGEON STRL SZ7 (GLOVE) ×2 IMPLANT
GLOVE BIOGEL PI IND STRL 7.5 (GLOVE) ×1 IMPLANT
GLOVE BIOGEL PI INDICATOR 7.5 (GLOVE) ×1
GOWN STRL NON-REIN LRG LVL3 (GOWN DISPOSABLE) ×8 IMPLANT
HEMOSTAT SNOW SURGICEL 2X4 (HEMOSTASIS) ×1 IMPLANT
KIT BASIN OR (CUSTOM PROCEDURE TRAY) ×2 IMPLANT
KIT ROOM TURNOVER OR (KITS) ×2 IMPLANT
NS IRRIG 1000ML POUR BTL (IV SOLUTION) ×2 IMPLANT
PAD ARMBOARD 7.5X6 YLW CONV (MISCELLANEOUS) ×2 IMPLANT
POUCH SPECIMEN RETRIEVAL 10MM (ENDOMECHANICALS) ×2 IMPLANT
SCISSORS LAP 5X35 DISP (ENDOMECHANICALS) IMPLANT
SET CHOLANGIOGRAPH 5 50 .035 (SET/KITS/TRAYS/PACK) ×1 IMPLANT
SET IRRIG TUBING LAPAROSCOPIC (IRRIGATION / IRRIGATOR) ×2 IMPLANT
SLEEVE ENDOPATH XCEL 5M (ENDOMECHANICALS) ×4 IMPLANT
SPECIMEN JAR SMALL (MISCELLANEOUS) ×2 IMPLANT
SPONGE GAUZE 4X4 12PLY (GAUZE/BANDAGES/DRESSINGS) ×1 IMPLANT
SPONGE LAP 18X18 X RAY DECT (DISPOSABLE) ×1 IMPLANT
SPONGE LAP 4X18 X RAY DECT (DISPOSABLE) ×1 IMPLANT
STAPLER VISISTAT 35W (STAPLE) ×1 IMPLANT
SUT ETHILON 2 0 FS 18 (SUTURE) ×1 IMPLANT
SUT MNCRL AB 4-0 PS2 18 (SUTURE) ×2 IMPLANT
SUT PDS AB 1 CTX 36 (SUTURE) ×2 IMPLANT
SUT SILK 2 0 (SUTURE) ×2
SUT SILK 2-0 18XBRD TIE 12 (SUTURE) IMPLANT
SUT SILK 3 0 (SUTURE) ×2
SUT SILK 3-0 18XBRD TIE 12 (SUTURE) IMPLANT
SUT VIC AB 0 CT1 27 (SUTURE) ×4
SUT VIC AB 0 CT1 27XBRD ANBCTR (SUTURE) IMPLANT
SUT VIC AB 2-0 CT3 27 (SUTURE) ×1 IMPLANT
SUT VIC AB 3-0 SH 27 (SUTURE) ×4
SUT VIC AB 3-0 SH 27X BRD (SUTURE) IMPLANT
SYR BULB IRRIGATION 50ML (SYRINGE) ×1 IMPLANT
TAPE CLOTH SURG 4X10 WHT LF (GAUZE/BANDAGES/DRESSINGS) ×1 IMPLANT
TAPE CLOTH SURG 6X10 WHT LF (GAUZE/BANDAGES/DRESSINGS) ×1 IMPLANT
TOWEL OR 17X24 6PK STRL BLUE (TOWEL DISPOSABLE) ×2 IMPLANT
TOWEL OR 17X26 10 PK STRL BLUE (TOWEL DISPOSABLE) ×2 IMPLANT
TRAY LAPAROSCOPIC (CUSTOM PROCEDURE TRAY) ×2 IMPLANT
TROCAR XCEL BLUNT TIP 100MML (ENDOMECHANICALS) ×2 IMPLANT
TROCAR XCEL NON-BLD 5MMX100MML (ENDOMECHANICALS) ×2 IMPLANT

## 2011-10-23 NOTE — Anesthesia Preprocedure Evaluation (Addendum)
Anesthesia Evaluation  Patient identified by MRN, date of birth, ID band Patient awake    Reviewed: Allergy & Precautions, H&P , NPO status , Patient's Chart, lab work & pertinent test results  History of Anesthesia Complications Negative for: history of anesthetic complications  Airway Mallampati: I TM Distance: >3 FB Neck ROM: Full    Dental  (+) Dental Advisory Given, Edentulous Upper and Edentulous Lower   Pulmonary shortness of breath and with exertion, sleep apnea and Continuous Positive Airway Pressure Ventilation , COPDformer smoker,  + rhonchi         Cardiovascular hypertension, Pt. on medications + dysrhythmias Atrial Fibrillation + pacemaker Rhythm:Irregular Rate:Normal     Neuro/Psych negative neurological ROS  negative psych ROS   GI/Hepatic Neg liver ROS, GERD-  Medicated and Controlled,  Endo/Other  negative endocrine ROS  Renal/GU negative Renal ROS     Musculoskeletal   Abdominal   Peds  Hematology negative hematology ROS (+)   Anesthesia Other Findings   Reproductive/Obstetrics                         Anesthesia Physical Anesthesia Plan  ASA: III  Anesthesia Plan: General   Post-op Pain Management:    Induction: Intravenous  Airway Management Planned: Oral ETT  Additional Equipment:   Intra-op Plan:   Post-operative Plan: Extubation in OR  Informed Consent: I have reviewed the patients History and Physical, chart, labs and discussed the procedure including the risks, benefits and alternatives for the proposed anesthesia with the patient or authorized representative who has indicated his/her understanding and acceptance.     Plan Discussed with: CRNA and Surgeon  Anesthesia Plan Comments:         Anesthesia Quick Evaluation

## 2011-10-23 NOTE — Interval H&P Note (Signed)
History and Physical Interval Note: I have reviewed history, imaging, labs and have discussed lap chole with risk of open chole with patient.    10/23/2011 7:53 AM  [name] [name].  has presented today for surgery, with the diagnosis of /  The various methods of treatment have been discussed with the patient and family. After consideration of risks, benefits and other options for treatment, the patient has consented to  Lap cholecystectomy with cholangiogram as a surgical intervention .  The patient's history has been reviewed, patient examined, no change in status, stable for surgery.  I have reviewed the patient's chart and labs.  Questions were answered to the patient's satisfaction.     [name] [name]

## 2011-10-23 NOTE — Op Note (Signed)
Preoperative diagnoses: #1 gallstone pancreatitis #2 acute cholecystitis #3 prior open ventral hernia repair with mesh Postoperative diagnosis: Same as above Procedure: Laparoscopic converted to open cholecystectomy with cholangiogram Surgeon: Dr. Harden Mo Assistant: Dr. Cicero Duck Anesthesia: Gen. Specimens: Gallbladder and contents to pathology Estimated blood loss: 50 cc Drains: 19 [place] Blake drain to gallbladder fossa Complications: None Sponge and needle count was correct x2 at end of operation Disposition to recovery stable  Indications: This is an 78 year old I know well from a prior open ventral hernia with mesh. He has had symptoms consistent with gallbladder disease was noted to have gallstone pancreatitis. This has resolved but he also pretty clearly has acute cholecystitis. I discussed a laparoscopic possible open cholecystectomy with him.  Procedure: After informed consent was obtained the patient was taken to the operative room. He was ministered antibiotics. He had sequential compression devices on his legs. He was in place her general endotracheal anesthesia without complication. His abdomen was prepped and draped in the standard sterile surgical fashion. Surgical timeout was performed.  I elected to access in his left upper quadrant. I infiltrated quarter percent Marcaine. I made an incision and dissected down to the fascia. I incised this fascia and then was able to identify what actually ended up being his mesh. I entered his peritoneum sharply without any evidence of any injury. This was a free space in his left upper quadrant and I inserted a 5 mm trocar insufflated the abdomen. When I did this I noted he had extensive adhesions to his mesh. This appeared to be a fair amount of small bowel that was adherent to his mesh. I was able to go around the adhesions and the mesh underneath the falciform to access his right upper quadrant. He had a distended white gallbladder  there clearly was full of purulent fluid. There was also some fluid around his gallbladder at that time. I did put in a trocar is left upper quadrant just to see if I was going to be able to do this safely with the laparoscope. I put the trocar in and looked to be adhesions as well as the purulent gallbladder and I decided that it would be the safest and most efficient way to take care of him to remove his gallbladder in open fashion. I think he would've taken an extraordinary amount of time and I think he was a very high risk of having an injury from lysis of adhesions. I removed the laparoscopic equipment and made a right upper quadrant incision. I incised the fascia in the muscle. I entered the peritoneum without any difficulty. His gallbladder was distended, white, and purulent. I made a hole in his gallbladder and evacuated all of this material. I then placed sponges inside the abdomen and removed his gallbladder from the liver bed using cautery. I dissected this down all the way to his cystic duct and cystic artery the cystic artery branches were clipped and divided. The cystic duct was then identified. I did do a cholangiogram using a catheter. This showed that I did not have any stones, filling of both sides of the liver, I was in the cystic duct, and filling of the duodenum. Once I had done this I removed the catheter I clipped the duct and then use a Vicryl suture to oversew the duct. I then irrigated copiously. I then placed a piece of Surgicel in the liver bed. I did place a 89 [place] Blake drain in the gallbladder fossa and secured this  with a 2-0 nylon suture. I then closed the posterior layer with 0 Vicryl in the anterior fascia with #1 PDS. I then irrigated this wound and stapled it closed. I then went back to my left upper quadrant incision I closed the hole in the mesh as well as the fascia with 0 Vicryl suture. I then closed this with staples. He tolerated this well was extubated and transferred  to recovery stable.

## 2011-10-23 NOTE — Progress Notes (Signed)
Pt converted to NSR. EKG obtained to verify. MD notified, new orders received. Will continue to monitor.

## 2011-10-23 NOTE — Progress Notes (Signed)
[facility] PROGRESS NOTE  [name] [name]. ZOX:[id number] DOB: May 29, 1924 DOA: 10/19/2011   Assessment/Plan: Patient Active Hospital Problem List: [name] -CT abdomen and pelvis 2.2 cm stone is identified in the neck of the gallbladder with other stones associated. Markedly distended gallbladder with gallstones and pericholecystic fluid.   -Lap Chole today -Appreciate GI and CCS assistance  Atrial Fib: from 9/6 to 9/7, converted to NSR last pm -diltiazem stopped last pm -keep on  Telemetry. -check 2D echo, TSH - had VQ scan 9/6 showed low prob of PE -Chads2 score 2, not a candidate for anticoagulation at this time, with abd surgery  Chronic distal lower extremity DVT: R popliteal vein chronic DVT -Chronic, asymptomatic, Dr.Feliz discussed with Dr.Ennever 9/6, recommended ASA 160mg  daily, this can be started when ok with surgery  Anemia (12/10/2010) -stable, Fu with Dr.shahdad -Continue to monitor  COPD (chronic obstructive pulmonary disease) (02/04/2011) -stable.  Pacemaker (10/19/2011)  Prostate cancer (10/19/2011) -f/u as an outpatient.   Code Status: full Family Communication: patient Disposition Plan: TBD     LOS: 4 days   Procedures:  ERCP  Antibiotics:  zosyn  Interim History:   Subjective: Just back from OR, breathing ok, no CP/Palpitations  Objective: Filed Vitals:   10/23/11 1116 10/23/11 1117 10/23/11 1148 10/23/11 1300  BP:   132/64 116/57  Pulse: 60 60 61 59  Temp:      TempSrc:      Resp: 15 13 15 15   Height:      Weight:      SpO2: 98% 97% 97% 99%    Intake/Output Summary (Last 24 hours) at 10/23/11 1640 Last data filed at 10/23/11 1300  Gross per 24 hour  Intake   1300 ml  Output    625 ml  Net    675 ml   Weight change: 6.619 kg (14 lb 9.5 oz)  Exam:  General: Alert, awake, oriented x3, in no acute distress.  Heart: regular rate and rhythm, without murmurs, rubs, gallops.  Lungs: Good air movement,  bibasilar crackles R>L Abdomen: Soft,tender abdomen as expected, no BS heard, dressing Neuro: Grossly intact, nonfocal. Ext: RLE with varicose veins noted   Data Reviewed: Basic Metabolic Panel:  Lab 10/22/11 8119 10/21/11 0759 10/20/11 2148 10/20/11 0956 10/19/11 1519  NA 141 141 -- 141 139  K 3.8 4.0 -- -- --  CL 106 104 -- 105 98  CO2 24 22 -- 23 28  GLUCOSE 133* 129* -- 124* 123*  BUN 49* 43* -- 30* 22  CREATININE 1.62* 1.63* -- 1.18 0.98  CALCIUM 8.8 9.1 -- 8.8 10.3  MG -- -- 1.7 -- --  PHOS -- -- -- -- --   Liver Function Tests:  Lab 10/22/11 0520 10/21/11 0759 10/20/11 1001 10/20/11 0956 10/19/11 1519  AST 224* 358* 125* 124* 106*  ALT 201* 247* 115* 115* 115*  ALKPHOS 286* 317* 265* 263* 342*  BILITOT 3.2* 7.2* 8.3* 8.2* 3.6*  PROT 5.6* 5.8* 5.6* 5.5* 7.8  ALBUMIN 2.2* 2.5* 2.7* 2.6* 4.1    Lab 10/22/11 0520 10/21/11 0759 10/20/11 0956 10/19/11 1751  LIPASE 67* 928* 2466* >3000*  AMYLASE -- -- -- --   No results found for this basename: AMMONIA:5 in the last 168 hours CBC:  Lab 10/22/11 0520 10/21/11 0759 10/20/11 1001 10/19/11 1519  WBC 19.0* 16.1* 14.9* 16.5*  NEUTROABS 17.9* 15.6* 14.4* 14.8*  HGB 10.6* 11.9* 10.9* 13.1  HCT 32.5* 35.9* 33.3* 40.3  MCV 95.6 96.2 97.1  97.6  PLT 61* 75* 124* 210   Cardiac Enzymes:  Lab 10/19/11 2354 10/19/11 1715  CKTOTAL -- --  CKMB -- --  CKMBINDEX -- --  TROPONINI <0.30 <0.30   BNP: No components found with this basename: POCBNP:5 CBG:  Lab 10/23/11 1140 10/23/11 0411 10/23/11 0021 10/22/11 2016 10/22/11 1631  GLUCAP 104* 101* 98 100* 99    No results found for this or any previous visit (from the past 240 hour(s)).   Studies: Dg Chest 2 View  10/19/2011  *RADIOLOGY REPORT*  Clinical Data: Emesis.  CHEST - 2 VIEW  Comparison: Chest x-ray 11/13/2010.  Findings: Lung volumes are normal.  No acute consolidative airspace disease.  Linear opacities at the left base, similar to prior examinations, likely  represent areas of chronic scarring.  Multiple old healed bilateral rib fractures are again noted.  Pulmonary vasculature and the cardiomediastinal silhouette are within normal limits.  Left-sided pacemaker device in place with lead tips projecting over the expected location of the right atrium and right ventricular apex.  IMPRESSION: 1.  No radiographic evidence of acute cardiopulmonary disease. 2.  Chronic scarring in the left base again noted. 3.  Multiple old healed bilateral rib fractures.   Original Report Authenticated By: Florencia Reasons, M.D.    US Abdomen Complete  10/19/2011  *RADIOLOGY REPORT*  Clinical Data:  Right upper quadrant abdominal pain with emesis.  COMPLETE ABDOMINAL ULTRASOUND  Comparison:  Abdominal CT 05/11/2011.  Abdominal ultrasound 08/30/2005.  Findings:  Gallbladder: Multiple gallstones are present.  The gallbladder is distended without wall thickening or pericholecystic fluid.  A positive sonographic Murphy's sign is reported by the sonographer.  Common bile duct:   The common bile duct is mildly dilated to 8.9 mm.  This appears new compared with the recent abdominal CT.  No ductal calculi are seen.  The distal duct is obscured by bowel gas.  Liver:  Small calcifications are present within the left hepatic lobe best seen on prior CT.  There are no focal liver lesions.  IVC:  Visualized portions appear unremarkable.  Pancreas:  Visualized portions appear unremarkable.Portions of the pancreas are obscured by bowel gas.  Spleen:  Visualized portions appear unremarkable.  Right Kidney:   The renal cortical thickness and echogenicity are preserved.  There is no hydronephrosis or focal abnormality. Renal length is 10.2 cm.  Left Kidney: There is a 2.6 centimeter cyst in the interpolar region which appears stable.  The renal cortical thickness and echogenicity are preserved.  There is no hydronephrosis or other focal abnormality. Renal length is 10.8 cm.  Abdominal aorta:  Visualized  portions appear unremarkable.  IMPRESSION:  1.  Cholelithiasis with distended gallbladder and reported sonographic Murphy's sign.  Early cholecystitis cannot be excluded, however there is no gallbladder wall thickening. 2.  Mild biliary dilatation appears new compared with the abdominal CT of 5 months ago.  Choledocholithiasis should be considered.  Of note, the patient has a pacemaker and cannot undergo MRCP. 3.  No other acute or significant findings.   Original Report Authenticated By: Gerrianne Scale, M.D.     Scheduled Meds:    . antiseptic oral rinse  15 mL Mouth Rinse BID  . metoprolol  5 mg Intravenous Q8H  . morphine   Intravenous Q4H  . pantoprazole (PROTONIX) IV  40 mg Intravenous Q12H  . piperacillin-tazobactam (ZOSYN)  IV  3.375 g Intravenous Q6H  . sodium chloride  3 mL Intravenous Q12H   Continuous Infusions:    .  dextrose 5 % and 0.45 % NaCl with KCl 20 mEq/L 125 mL/hr at 10/22/11 1400  . DISCONTD: diltiazem (CARDIZEM) infusion Stopped (10/23/11 0543)    Zannie Cove, MD  [facility] Pager [phone]  If 7PM-7AM, please contact night-coverage [email or web address] Password [email or web address] 10/23/2011, 4:40 PM

## 2011-10-23 NOTE — Preoperative (Signed)
Beta Blockers   Reason not to administer Beta Blockers:Not Applicable 

## 2011-10-23 NOTE — Progress Notes (Signed)
Subjective: Had cholecystectomy today, where cholecystitis with GB purulence noted.  IOC no choledocholithiasis.  Objective: Vital signs in last 24 hours: Temp:  [97.8 F (36.6 C)-98.1 F (36.7 C)] 97.8 F (36.6 C) (09/07 1105) Pulse Rate:  [59-70] 59  (09/07 1300) Resp:  [11-22] 15  (09/07 1300) BP: (116-147)/(8-78) 116/57 mmHg (09/07 1300) SpO2:  [93 %-99 %] 99 % (09/07 1300) Weight:  [90.719 kg (200 lb)] 90.719 kg (200 lb) (09/07 0400) Weight change: 6.619 kg (14 lb 9.5 oz) Last BM Date: 10/19/11  PE: GEN:  Jaundiced. SKIN:  Scattered ecchymoses ABD:  Soft, mild post-operative tenderness  Assessment:  1.  Cholecystitis. 2.  Elevated LFTs; no choledocholithiasis or biliary obstruction on intraoperative cholangiogram.  Plan:  1.  Follow LFTs expectantly. 2.  No indication for ERCP or further GI-related testing. 3.  Will sign-off; defer further recommendations to hospitalist and surgical consult teams; please call with questions.   Freddy Jaksch 10/23/2011, 4:39 PM

## 2011-10-23 NOTE — Transfer of Care (Signed)
Immediate Anesthesia Transfer of Care Note  Patient: [name] [name].  Procedure(s) Performed: Procedure(s) (LRB) with comments: LAPAROSCOPIC CHOLECYSTECTOMY WITH INTRAOPERATIVE CHOLANGIOGRAM (N/A) CHOLECYSTECTOMY (N/A) - with intraoperative cholangiogram  Patient Location: PACU  Anesthesia Type: General  Level of Consciousness: awake, alert  and oriented  Airway & Oxygen Therapy: Patient Spontanous Breathing and Patient connected to nasal cannula oxygen  Post-op Assessment: Report given to PACU RN and Post -op Vital signs reviewed and stable  Post vital signs: Reviewed and stable  Complications: No apparent anesthesia complications

## 2011-10-23 NOTE — Anesthesia Postprocedure Evaluation (Signed)
  Anesthesia Post-op Note  Patient: [name] [name].  Procedure(s) Performed: Procedure(s) (LRB) with comments: LAPAROSCOPIC CHOLECYSTECTOMY WITH INTRAOPERATIVE CHOLANGIOGRAM (N/A) CHOLECYSTECTOMY (N/A) - with intraoperative cholangiogram  Patient Location: PACU  Anesthesia Type: General  Level of Consciousness: awake and alert   Airway and Oxygen Therapy: Patient Spontanous Breathing  Post-op Pain: mild  Post-op Assessment: Post-op Vital signs reviewed, Patient's Cardiovascular Status Stable, Respiratory Function Stable, Patent Airway, No signs of Nausea or vomiting and Pain level controlled  Post-op Vital Signs: stable  Complications: No apparent anesthesia complications

## 2011-10-23 NOTE — H&P (View-Only) (Signed)
Patient ID: [name] Sandiford., male   DOB: March 06, 1924, 78 y.o.   MRN: [id number]    Subjective: Resting quietly awaiting surgery, still tender. Had brief epsode of "a-fib" per nurse now on metoproilol and Cardizem per medicine Robb Matar) with [facility].  Objective: Vital signs in last 24 hours: Temp:  [98.4 F (36.9 C)-98.9 F (37.2 C)] 98.4 F (36.9 C) (09/06 0500) Pulse Rate:  [76-118] 118  (09/06 0500) Resp:  [18-20] 18  (09/06 0500) BP: (93-134)/(55-73) 134/73 mmHg (09/06 0500) SpO2:  [93 %-98 %] 97 % (09/06 0500) Weight:  [185 lb 6.5 oz (84.1 kg)] 185 lb 6.5 oz (84.1 kg) (09/06 0500) Last BM Date: 10/19/11  Intake/Output from previous day: 09/05 0701 - 09/06 0700 In: 1577.1 [I.V.:1577.1] Out: 700 [Urine:700] Intake/Output this shift: Total I/O In: -  Out: 150 [Urine:150]  General appearance: A/A/O Chest: RRR, no M/R/G Abdomen: remains tender, no N/V. + BS, flatus. Remains jaundice in appearance. WBC trending upward, VSS, afebrile  Lab Results:   [facility] 10/22/11 0520 10/21/11 0759  WBC 19.0* 16.1*  HGB 10.6* 11.9*  HCT 32.5* 35.9*  PLT 61* 75*   BMET  Basename 10/22/11 0520 10/21/11 0759  NA 141 141  K 3.8 4.0  CL 106 104  CO2 24 22  GLUCOSE 133* 129*  BUN 49* 43*  CREATININE 1.62* 1.63*  CALCIUM 8.8 9.1   PT/INR  Basename 10/21/11 0759  LABPROT 16.9*  INR 1.35   ABG No results found for this basename: PHART:2,PCO2:2,PO2:2,HCO3:2 in the last 72 hours  Studies/Results: Ct Abdomen Pelvis W Contrast  10/20/2011  *RADIOLOGY REPORT*  Clinical Data: Jaundice and weakness.  CT ABDOMEN AND PELVIS WITH CONTRAST  Technique:  Multidetector CT imaging of the abdomen and pelvis was performed following the standard protocol during bolus administration of intravenous contrast.  Contrast:  100 ml Omnipaque-300  Comparison: 05/11/2011  Findings: Small bilateral pleural effusions are evident.  The patient has compressive atelectasis in both lower lobes.  A  small hiatal hernia is evident. Calcified granulomata are seen in the liver and spleen.  2.2 cm stone is identified in the neck of the gallbladder with other stones associated.  The gallbladder is prominently distended, measuring up to 5 cm in diameter.  There is pericholecystic fluid.  Intrahepatic biliary duct dilatation is associated although no gross dilatation of the extrahepatic common duct is evident.  The duodenum, pancreas, and adrenal glands are unremarkable.  2.2 cm cyst is identified in the interpolar left kidney.  No abdominal aortic aneurysm.  No free fluid in the abdomen.  Small lymph nodes are seen in the hepatoduodenal ligament.  Imaging through the pelvis shows a small amount of intraperitoneal free fluid.  There is no pelvic sidewall lymphadenopathy.  Bladder is unremarkable.  No colonic diverticulitis terminal ileum is unremarkable. The appendix is not visualized, but there is no edema or inflammation in the region of the cecum.  Bone windows reveal no worrisome lytic or sclerotic osseous lesions.  IMPRESSION: Markedly distended gallbladder with gallstones and pericholecystic fluid.  There is associated intrahepatic biliary duct dilatation. Acute cholecystitis is a concern given these imaging features.  Small bilateral pleural effusions with compressive atelectasis in both lower lobes.  I discussed these findings by telephone with the Hospitalist PA Vernona Rieger [place]) at 2007 hours on 10/20/2011.   Original Report Authenticated By: ERIC A. MANSELL, M.D.     Anti-infectives: Anti-infectives     Start     Dose/Rate Route Frequency Ordered Stop  10/20/11 2015   piperacillin-tazobactam (ZOSYN) IVPB 3.375 g        3.375 g 12.5 mL/hr over 240 Minutes Intravenous 4 times per day 10/20/11 2013     10/20/11 0000   piperacillin-tazobactam (ZOSYN) IVPB 3.375 g  Status:  Discontinued        3.375 g 12.5 mL/hr over 240 Minutes Intravenous Every 8 hours 10/19/11 2328 10/20/11 1159   10/19/11 1745    piperacillin-tazobactam (ZOSYN) IVPB 3.375 g        3.375 g 12.5 mL/hr over 240 Minutes Intravenous  Once 10/19/11 1732 10/19/11 2200          Assessment/Plan:  s/p Procedure(s) (LRB) with comments: ENDOSCOPIC RETROGRADE CHOLANGIOPANCREATOGRAPHY (ERCP) (N/A) Lipase improved (67) TB still elevated.(3.2) GI following.  Continue NPO and bowel rest for now.   Plan is for cholecystectomy today   LOS: 3 days    [name] [name], [name] [name] 10/22/2011 Not much change from prior.  Wbc up and LFT's improving.  I discussed his case with Dr. Ewing Schlein and we are planning on lap chole first, however, due to emergent surgery add-ons he has been pushed back.  He is next in line for OR.

## 2011-10-24 DIAGNOSIS — I059 Rheumatic mitral valve disease, unspecified: Secondary | ICD-10-CM

## 2011-10-24 LAB — GLUCOSE, CAPILLARY: Glucose-Capillary: 103 mg/dL — ABNORMAL HIGH (ref 70–99)

## 2011-10-24 LAB — COMPREHENSIVE METABOLIC PANEL
ALT: 114 U/L — ABNORMAL HIGH (ref 0–53)
Alkaline Phosphatase: 348 U/L — ABNORMAL HIGH (ref 39–117)
CO2: 24 mEq/L (ref 19–32)
GFR calc Af Amer: 84 mL/min — ABNORMAL LOW (ref >90)
GFR calc non Af Amer: 73 mL/min — ABNORMAL LOW (ref >90)
Glucose, Bld: 109 mg/dL — ABNORMAL HIGH (ref 70–99)
Potassium: 4.3 mEq/L (ref 3.5–5.1)
Sodium: 140 mEq/L (ref 135–145)
Total Bilirubin: 2.2 mg/dL — ABNORMAL HIGH (ref 0.3–1.2)

## 2011-10-24 LAB — CBC
MCHC: 31.9 g/dL (ref 30.0–36.0)
RDW: 14.8 % (ref 11.5–15.5)

## 2011-10-24 MED ORDER — DOCUSATE SODIUM 100 MG PO CAPS
100.0000 mg | ORAL_CAPSULE | Freq: Every day | ORAL | Status: DC | PRN
  Filled 2011-10-24: qty 1

## 2011-10-24 MED ORDER — HYDROMORPHONE HCL PF 1 MG/ML IJ SOLN
0.5000 mg | INTRAMUSCULAR | Status: DC | PRN
Start: 2011-10-24 — End: 2011-10-25
  Administered 2011-10-25: 0.5 mg via INTRAVENOUS
  Filled 2011-10-24: qty 1

## 2011-10-24 NOTE — Progress Notes (Signed)
[facility] PROGRESS NOTE  [name] [name]. ZOX:[id number] DOB: 1924/09/02 DOA: 10/19/2011   Assessment/Plan: Patient Active Hospital Problem List: [name] [name] stone pancreatitis -CT abdomen and pelvis 2.2 cm stone is identified in the neck of the gallbladder with other stones associated. Markedly distended gallbladder with gallstones and pericholecystic fluid.   -Status post laparoscopic cholecystectomy. Patient started having positive flatus. Plan is to start by mouth today. -Appreciate GI and CCS assistance  Atrial Fib: from 9/6 to 9/7, converted to NSR last pm -diltiazem stopped last pm -keep on  Telemetry. -check 2D echo, TSH - had VQ scan 9/6 showed low prob of PE -Chads2 score 2, not a candidate for anticoagulation at this time, with abd surgery  Chronic distal lower extremity DVT: R popliteal vein chronic DVT -Chronic, asymptomatic, [name] [name] discussed with [name] [name] 9/6, recommended ASA 160mg  daily, this can be started when ok with surgery In the meantime, monitoring. In the meantime, no treatment with anticoagulation  Anemia (12/10/2010) -stable, Fu with [name] [name] -Continue to monitor  COPD (chronic obstructive pulmonary disease) (02/04/2011) -stable.  Pacemaker (10/19/2011)  Prostate cancer (10/19/2011) -f/u as an outpatient.   Code Status: full Family Communication: patient Disposition Plan: TBD     LOS: 5 days   Procedures: Laparoscopic cholecystectomy done 10/23/11  Antibiotics:  IV Zosyn day 5  Interim History: 78 year old white male past we'll history of COPD and chronic anemia status post radiation treatment for prostate cancer who was having worsening abdominal pain for 2-3 weeks and presented with epigastric and right upper quadrant pain. He was found to have acute pancreatitis and a gallstones with a mildly dilated common bile duct. Gastroenterology was consult in and initially the plan was for ERCP but in discussion with surgery patient went for  laparoscopic cholecystectomy done on 10/23/11.  Subjective: Patient doing okay. No complaints. Abdominal pain. Has had some flatus.  Objective: Filed Vitals:   10/24/11 0033 10/24/11 0402 10/24/11 0445 10/24/11 0800  BP: 109/67  115/69   Pulse: 59  63   Temp: 97.8 F (36.6 C)  97.5 F (36.4 C)   TempSrc: Oral  Oral   Resp: 18 16 18 18   Height:      Weight:      SpO2: 98% 95% 97% 96%    Intake/Output Summary (Last 24 hours) at 10/24/11 1224 Last data filed at 10/24/11 1132  Gross per 24 hour  Intake      0 ml  Output    598 ml  Net   -598 ml   Weight change:   Exam:  General: Alert, awake, oriented x3, in no acute distress. Positive flatus Heart: regular rate and rhythm, without murmurs, rubs, gallops.  Lungs: Good air movement, bibasilar crackles R>L Abdomen: Soft, minimally tender no BS heard, dressing Neuro: Grossly intact, nonfocal. Ext: Trace edema   Data Reviewed: Basic Metabolic Panel:  Lab 10/24/11 8119 10/22/11 0520 10/21/11 0759 10/20/11 2148 10/20/11 0956 10/19/11 1519  NA 140 141 141 -- 141 139  K 4.3 3.8 -- -- -- --  CL 107 106 104 -- 105 98  CO2 24 24 22  -- 23 28  GLUCOSE 109* 133* 129* -- 124* 123*  BUN 44* 49* 43* -- 30* 22  CREATININE 0.99 1.62* 1.63* -- 1.18 0.98  CALCIUM 8.8 8.8 9.1 -- 8.8 10.3  MG -- -- -- 1.7 -- --  PHOS -- -- -- -- -- --   Liver Function Tests:  Lab 10/24/11 1478 10/22/11 0520 10/21/11 0759 10/20/11 1001 10/20/11 2956  AST 65* 224* 358* 125* 124*  ALT 114* 201* 247* 115* 115*  ALKPHOS 348* 286* 317* 265* 263*  BILITOT 2.2* 3.2* 7.2* 8.3* 8.2*  PROT 5.3* 5.6* 5.8* 5.6* 5.5*  ALBUMIN 1.9* 2.2* 2.5* 2.7* 2.6*    Lab 10/22/11 0520 10/21/11 0759 10/20/11 0956 10/19/11 1751  LIPASE 67* 928* 2466* >3000*  AMYLASE -- -- -- --   CBC:  Lab 10/24/11 0614 10/22/11 0520 10/21/11 0759 10/20/11 1001 10/19/11 1519  WBC 10.8* 19.0* 16.1* 14.9* 16.5*  NEUTROABS -- 17.9* 15.6* 14.4* 14.8*  HGB 10.1* 10.6* 11.9* 10.9* 13.1    HCT 31.7* 32.5* 35.9* 33.3* 40.3  MCV 96.1 95.6 96.2 97.1 97.6  PLT 63* 61* 75* 124* 210   Cardiac Enzymes:  Lab 10/19/11 2354 10/19/11 1715  CKTOTAL -- --  CKMB -- --  CKMBINDEX -- --  TROPONINI <0.30 <0.30   CBG:  Lab 10/24/11 1135 10/24/11 0814 10/24/11 0418 10/24/11 0027 10/23/11 2009  GLUCAP 92 101* 114* 103* 113*     Studies: Dg Chest 2 View  10/19/2011    IMPRESSION: 1.  No radiographic evidence of acute cardiopulmonary disease. 2.  Chronic scarring in the left base again noted. 3.  Multiple old healed bilateral rib fractures.   Original Report Authenticated By: [name] [name], M.D.    US Abdomen Complete  10/19/2011   IMPRESSION:  1.  Cholelithiasis with distended gallbladder and reported sonographic Murphy's sign.  Early cholecystitis cannot be excluded, however there is no gallbladder wall thickening. 2.  Mild biliary dilatation appears new compared with the abdominal CT of 5 months ago.  Choledocholithiasis should be considered.  Of note, the patient has a pacemaker and cannot undergo MRCP. 3.  No other acute or significant findings.   Original Report Authenticated By: [name] [name], M.D.     Scheduled Meds:    . antiseptic oral rinse  15 mL Mouth Rinse BID  . metoprolol  5 mg Intravenous Q8H  . pantoprazole (PROTONIX) IV  40 mg Intravenous Q12H  . piperacillin-tazobactam (ZOSYN)  IV  3.375 g Intravenous Q6H  . sodium chloride  3 mL Intravenous Q12H  . DISCONTD: morphine   Intravenous Q4H   Continuous Infusions:    . DISCONTD: dextrose 5 % and 0.45 % NaCl with KCl 20 mEq/L 125 mL/hr at 10/22/11 1400    [name] Rochester, MD [facility] Pager [phone]  If 7PM-7AM, please contact night-coverage [email or web address] Password [facility] 10/24/2011, 12:24 PM

## 2011-10-24 NOTE — Progress Notes (Signed)
1 Day Post-Op  Subjective:  Appears much less jaundice this morning other than some soreness at surgical incision site, he offers no c/o + Flatus no N/V or bloating. Minimal pain.  Objective: Vital signs in last 24 hours: Temp:  [97.5 F (36.4 C)-98.1 F (36.7 C)] 97.5 F (36.4 C) (09/08 0445) Pulse Rate:  [59-93] 63  (09/08 0445) Resp:  [15-18] 18  (09/08 0800) BP: (109-137)/(57-85) 115/69 mmHg (09/08 0445) SpO2:  [95 %-100 %] 96 % (09/08 0800) FiO2 (%):  [95 %-99 %] 96 % (09/08 0800) Last BM Date: 10/19/11  Intake/Output from previous day: 09/07 0701 - 09/08 0700 In: 1300 [I.V.:1300] Out: 473 [Urine:150; Drains:148; Blood:175] Intake/Output this shift: Total I/O In: -  Out: 300 [Urine:300]  General appearance: alert, cooperative, appears stated age and no distress Chest: CTA  Cardiac: RRR Abdomen: surgical incision small amount of localized erythema along staple line no induration or drainage noted. appropriately tender, +BS and flatus. WBC wnl, BUN improving, creatine wnl, afebrile and HD stable.  Lab Results:   [facility] 10/24/11 0614 10/22/11 0520  WBC 10.8* 19.0*  HGB 10.1* 10.6*  HCT 31.7* 32.5*  PLT 63* 61*   BMET  Basename 10/24/11 0614 10/22/11 0520  NA 140 141  K 4.3 3.8  CL 107 106  CO2 24 24  GLUCOSE 109* 133*  BUN 44* 49*  CREATININE 0.99 1.62*  CALCIUM 8.8 8.8   PT/INR No results found for this basename: LABPROT:2,INR:2 in the last 72 hours ABG No results found for this basename: PHART:2,PCO2:2,PO2:2,HCO3:2 in the last 72 hours  Studies/Results: Dg Chest 1 View  10/22/2011  *RADIOLOGY REPORT*  Clinical Data: Lung scan.  Short of breath.  VQ scan complimentary study.  CHEST - 1 VIEW  Comparison: 10/22/2011.  Findings: Dual lead left subclavian cardiac pacemaker.  No airspace disease.  No effusion.  Cardiopericardial silhouette and mediastinal contours appear within normal limits.  Stable rightward tracheal deviation at the level of the aortic  arch with mild tortuosity of the thoracic aorta.  IMPRESSION: No active cardiopulmonary disease.  Unchanged dual lead left subclavian cardiac pacemaker.   Original Report Authenticated By: Andreas Newport, M.D.    Dg Abd 1 View  10/23/2011  *RADIOLOGY REPORT*  Clinical Data: Incorrect count.  ABDOMEN - 1 VIEW  Comparison: CT abdomen pelvis 10/20/2011  Findings: At the lung bases, cardiac pacer leads project over the expected location of the right atrium right ventricle.  Obliquely oriented line of skin staples projects over the right upper quadrant. Skin staples are also seen over the left mid abdomen. Surgical drain projects over the right upper quadrant.  Oral contrast is scattered within nondilated small bowel loops and colon from prior CT.  There is an 8 to 9 cm diameter opaque oval structure projecting over the upper right pelvis, and incompletely imaged, a likely reflecting a bulb of a drain, that is reportedly present, per the radiology technologist.  No unexpected radiopaque foreign body is identified.  IMPRESSION: No unexpected radio-opaque foreign body identified.   Original Report Authenticated By: Britta Mccreedy, M.D.    Dg Cholangiogram Operative  10/23/2011  *RADIOLOGY REPORT*  Clinical Data: Intraoperative cholangiogram  INTRAOPERATIVE CHOLANGIOGRAM  Comparison:  CT abdomen pelvis - 10/20/2011  Findings:  Intraoperative angiographic images of the right upper abdominal quadrant during laparoscopic cholecystectomy are provided for review.  A surgical drain and clips overlie the expected location of the gallbladder fossa.  Contrast injection demonstrates selective cannulation of the central aspect of the cystic  duct.  There is brisk passage of contrast through the central aspect of the cystic duct with filling of a none dilated common bile duct. There is brisk passage of contrast though the CBD and into the descending portion of the duodenum.  There is minimal reflux of injected contrast into the common  hepatic duct and central aspect of the nondilated intrahepatic biliary system.  There are no discrete filling defects within the opacified portions of the biliary system to suggest the presence of choledocholithiasis.  IMPRESSION:  Intraoperative cholangiogram as above.  No discrete filling defects to suggest the presence of choledocholithiasis.   Original Report Authenticated By: Waynard Reeds, M.D.    Nm Pulmonary Perfusion  10/22/2011  *RADIOLOGY REPORT*  Clinical Data: Evaluate for pulmonary embolus.  Atrial fibrillation.  NM PULMONARY PERFUSION PARTICULATE  Radiopharmaceutical: CURIE MAA TECHNETIUM TO 6M ALBUMIN AGGREGATED  Comparison: Chest radiograph from 11/15/2011  Findings: Heterogeneous radiotracer activity to both lungs noted. There is no medium or heart size segmental perfusion defects identified.  A photopenic area overlying the left upper lobe is identified which likely represents artifact from patient's pacer device.  IMPRESSION:  1.  Low probability for acute pulmonary embolus.   Original Report Authenticated By: Rosealee Albee, M.D.     Anti-infectives: Anti-infectives     Start     Dose/Rate Route Frequency Ordered Stop   10/20/11 2015  piperacillin-tazobactam (ZOSYN) IVPB 3.375 g       3.375 g 12.5 mL/hr over 240 Minutes Intravenous 4 times per day 10/20/11 2013     10/20/11 0000   piperacillin-tazobactam (ZOSYN) IVPB 3.375 g  Status:  Discontinued        3.375 g 12.5 mL/hr over 240 Minutes Intravenous Every 8 hours 10/19/11 2328 10/20/11 1159   10/19/11 1745  piperacillin-tazobactam (ZOSYN) IVPB 3.375 g       3.375 g 12.5 mL/hr over 240 Minutes Intravenous  Once 10/19/11 1732 10/19/11 2200          Assessment/Plan: Patient Active Problem List  Diagnosis  . Ventral hernia, recurrent  . Anemia  . COPD (chronic obstructive pulmonary disease)  . Acute pancreatitis  . Pacemaker  . Prostate cancer  . Atrial fibrillation  . DVT, lower extremity, distal,  chronic  . Gall stone pancreatitis   s/p Procedure(s) (LRB) with comments: LAPAROSCOPIC CHOLECYSTECTOMY WITH INTRAOPERATIVE CHOLANGIOGRAM (N/A) CHOLECYSTECTOMY (N/A) - with intraoperative cholangiogram Advance diet Dc pca change to prn pain meds. Ambulate/OOB/IS Recheck labs in am Discussed plan with medicine team and they concur (management of his other medical challenges per medicine)   LOS: 5 days    [name] [name] 10/24/2011

## 2011-10-24 NOTE — Progress Notes (Signed)
  Echocardiogram 2D Echocardiogram has been performed.  [name] [name] FRANCES 10/24/2011, 10:47 AM

## 2011-10-24 NOTE — Progress Notes (Signed)
Feels good and complaining we haven't let him got home. Taking liquids po;minimal pai and no nausea; not ambulterd much yet.  Agree with A&P of JD,PA

## 2011-10-25 ENCOUNTER — Encounter (HOSPITAL_COMMUNITY): Payer: Self-pay | Admitting: General Surgery

## 2011-10-25 LAB — BASIC METABOLIC PANEL
BUN: 44 mg/dL — ABNORMAL HIGH (ref 6–23)
Chloride: 103 mEq/L (ref 96–112)
GFR calc Af Amer: 73 mL/min — ABNORMAL LOW (ref >90)
Potassium: 3.7 mEq/L (ref 3.5–5.1)

## 2011-10-25 LAB — CBC
HCT: 31 % — ABNORMAL LOW (ref 39.0–52.0)
Hemoglobin: 10.2 g/dL — ABNORMAL LOW (ref 13.0–17.0)
RDW: 14.5 % (ref 11.5–15.5)
WBC: 9.3 10*3/uL (ref 4.0–10.5)

## 2011-10-25 LAB — GLUCOSE, CAPILLARY
Glucose-Capillary: 104 mg/dL — ABNORMAL HIGH (ref 70–99)
Glucose-Capillary: 104 mg/dL — ABNORMAL HIGH (ref 70–99)
Glucose-Capillary: 84 mg/dL (ref 70–99)

## 2011-10-25 MED ORDER — PANTOPRAZOLE SODIUM 40 MG PO TBEC
40.0000 mg | DELAYED_RELEASE_TABLET | Freq: Two times a day (BID) | ORAL | Status: DC
  Administered 2011-10-25: 40 mg via ORAL

## 2011-10-25 MED ORDER — POLYETHYLENE GLYCOL 3350 17 GM/SCOOP PO POWD: 17.0000 g | Freq: Every day | ORAL | Status: AC | PRN

## 2011-10-25 MED ORDER — LEVOFLOXACIN 500 MG PO TABS: 500.0000 mg | ORAL_TABLET | Freq: Every day | ORAL | Status: AC

## 2011-10-25 MED ORDER — POLYETHYLENE GLYCOL 3350 17 G PO PACK
17.0000 g | PACK | ORAL | Status: AC
  Administered 2011-10-25: 17 g via ORAL
  Filled 2011-10-25: qty 1

## 2011-10-25 MED ORDER — FLEET ENEMA 7-19 GM/118ML RE ENEM
1.0000 | ENEMA | RECTAL | Status: DC
  Filled 2011-10-25: qty 1

## 2011-10-25 MED ORDER — FLEET ENEMA 7-19 GM/118ML RE ENEM
1.0000 | ENEMA | RECTAL | Status: AC
  Administered 2011-10-25: 1 via RECTAL
  Filled 2011-10-25 (×2): qty 1

## 2011-10-25 NOTE — Progress Notes (Signed)
Physical Therapy Treatment Patient Details Name: [name] [name]. MRN: [id number] DOB: 27-Jan-1925 Today's Date: 10/25/2011 Time: 1000-1023 PT Time Calculation (min): 23 min  PT Assessment / Plan / Recommendation Comments on Treatment Session  Pt admitted with pancreatitis and progressing with mobility but limited with balance and fatigue.  Pt ambulated immediately prior to PT arrival with CNA limiting endurance/tolerance to further activity.    Follow Up Recommendations  Supervision - Intermittent;Home health PT    Barriers to Discharge        Equipment Recommendations  Rolling walker with 5" wheels    Recommendations for Other Services    Frequency Min 3X/week   Plan Frequency remains appropriate;Discharge plan needs to be updated    Precautions / Restrictions Precautions Precautions: Fall Restrictions Weight Bearing Restrictions: No   Pertinent Vitals/Pain None    Mobility  Bed Mobility Bed Mobility: Not assessed Transfers Transfers: Sit to Stand;Stand to Sit Sit to Stand: 4: Min assist;With upper extremity assist;From chair/3-in-1 Stand to Sit: 4: Min assist;With upper extremity assist;To chair/3-in-1 Details for Transfer Assistance: Assist to translate trunk anterior over BOS and to cue for trunk extension.  Cues for overall sequence and safest hand placement. Ambulation/Gait Ambulation/Gait Assistance: Not tested (comment) (Reports ambulation with CNA about 40 feet with RW.) Stairs: No Wheelchair Mobility Wheelchair Mobility: No    Exercises General Exercises - Lower Extremity Ankle Circles/Pumps: AROM;Both;15 reps;Seated Heel Slides: AAROM;Both;15 reps;Seated Hip ABduction/ADduction: AAROM;Both;15 reps;Seated;Standing Straight Leg Raises: AAROM;Both;15 reps;Seated Hip Flexion/Marching: AROM;Both;15 reps;Standing   PT Diagnosis:    PT Problem List:   PT Treatment Interventions:     PT Goals Acute Rehab PT Goals PT Goal Formulation: With  patient/family Time For Goal Achievement: 11/03/11 Potential to Achieve Goals: Good PT Goal: Sit to Stand - Progress: Progressing toward goal PT Goal: Stand to Sit - Progress: Progressing toward goal PT Goal: Perform Home Exercise Program - Progress: Progressing toward goal  Visit Information  Last PT Received On: 10/25/11 Assistance Needed: +1    Subjective Data  Subjective: "I just walked with the nurse tech." Patient Stated Goal: return home   Cognition  Overall Cognitive Status: Appears within functional limits for tasks assessed/performed Arousal/Alertness: Awake/alert Orientation Level: Appears intact for tasks assessed Behavior During Session: [facility] for tasks performed    Balance  Balance Balance Assessed: Yes Static Standing Balance Static Standing - Balance Support: Bilateral upper extremity supported;During functional activity Static Standing - Level of Assistance: 5: Stand by assistance Static Standing - Comment/# of Minutes: Stood about 5 minutes with supervision during functional activities.  Cues for tall posture.  Time limited by fatigue.  End of Session PT - End of Session Equipment Utilized During Treatment: Gait belt Activity Tolerance: Patient tolerated treatment well;Patient limited by fatigue Patient left: in chair;with call bell/phone within reach;with family/visitor present Nurse Communication: Mobility status   GP     Cephus Shelling 10/25/2011, 10:30 AM  10/25/2011 Cephus Shelling, PT, DPT [phone]

## 2011-10-25 NOTE — Care Management Note (Signed)
    Page 1 of 2   10/25/2011     2:15:53 PM   CARE MANAGEMENT NOTE 10/25/2011  Patient:  MARVELLE, CAUDILL   Account Number:  [id number]  Date Initiated:  10/25/2011  Documentation initiated by:  GRAVES-BIGELOW,Ashtyn Freilich  Subjective/Objective Assessment:   Pt admitted  with cp and gallstones. S/p lap chole. Plan for home today. Pt is from home with wife.     Action/Plan:   CM spoke to pt and wife today and they will need HH services, RN,PT and dme RW. CM offerred choice and they chose [facility] for services. MD to place orders.   Anticipated DC Date:  10/25/2011   Anticipated DC Plan:  HOME W HOME HEALTH SERVICES      DC Planning Services  CM consult      PAC Choice  DURABLE MEDICAL EQUIPMENT  HOME HEALTH   Choice offered to / List presented to:  C-1 Patient   DME arranged  Levan Hurst      DME agency  [facility]     [facility] arranged  HH-1 RN  HH-10 DISEASE MANAGEMENT  HH-2 PT      [facility] agency  [facility]   Status of service:  Completed, signed off Medicare Important Message given?   (If response is "NO", the following Medicare IM given date fields will be blank) Date Medicare IM given:   Date Additional Medicare IM given:    Discharge Disposition:  HOME W HOME HEALTH SERVICES  Per UR Regulation:  Reviewed for med. necessity/level of care/duration of stay  If discussed at Long Length of Stay Meetings, dates discussed:    Comments:  10-25-11 1412 Tomi Bamberger, [place] [phone] CM will call and make referral for services. SOC to begin within 24-48 hours post d/c. DME to be delivered to room before d/c.

## 2011-10-25 NOTE — Progress Notes (Signed)
Doing relatively well from surgical standpoint.  [name] [name]. Gae Bon, MD, FACS [phone] [facility]

## 2011-10-25 NOTE — Progress Notes (Signed)
Patient ID: [name] Drudge., male   DOB: 01-Mar-1924, 78 y.o.   MRN: [id number] 2 Days Post-Op  Subjective:  Appears much less jaundice this morning other than some soreness at surgical incision site, he offers no c/o + Flatus no N/V or bloating. Minimal pain. Tolerating diet.  Objective: Vital signs in last 24 hours: Temp:  [98.1 F (36.7 C)] 98.1 F (36.7 C) (09/09 0400) Pulse Rate:  [59-61] 61  (09/09 0400) Resp:  [16-18] 16  (09/09 0400) BP: (112-124)/(64-71) 112/64 mmHg (09/09 0400) SpO2:  [94 %-98 %] 94 % (09/09 0400) Last BM Date: 10/19/11  Intake/Output from previous day: 09/08 0701 - 09/09 0700 In: 1080 [P.O.:1080] Out: 1085 [Urine:950; Drains:135] Intake/Output this shift:    General appearance: alert, cooperative, appears stated age and no distress Chest: CTA  Cardiac: RRR Abdomen: surgical incision small amount of localized erythema along staple line no induration or drainage noted. appropriately tender, +BS and flatus no BM yet. WBC wnl, BUN improving, creatine wnl, afebrile and HD stable.  Lab Results:   Basename 10/25/11 0650 10/24/11 0614  WBC 9.3 10.8*  HGB 10.2* 10.1*  HCT 31.0* 31.7*  PLT 106* 63*   BMET  Basename 10/25/11 0650 10/24/11 0614  NA 135 140  K 3.7 4.3  CL 103 107  CO2 25 24  GLUCOSE 95 109*  BUN 44* 44*  CREATININE 1.05 0.99  CALCIUM 8.6 8.8   PT/INR No results found for this basename: LABPROT:2,INR:2 in the last 72 hours ABG No results found for this basename: PHART:2,PCO2:2,PO2:2,HCO3:2 in the last 72 hours  Studies/Results: Dg Abd 1 View  10/23/2011  *RADIOLOGY REPORT*  Clinical Data: Incorrect count.  ABDOMEN - 1 VIEW  Comparison: CT abdomen pelvis 10/20/2011  Findings: At the lung bases, cardiac pacer leads project over the expected location of the right atrium right ventricle.  Obliquely oriented line of skin staples projects over the right upper quadrant. Skin staples are also seen over the left mid abdomen. Surgical  drain projects over the right upper quadrant.  Oral contrast is scattered within nondilated small bowel loops and colon from prior CT.  There is an 8 to 9 cm diameter opaque oval structure projecting over the upper right pelvis, and incompletely imaged, a likely reflecting a bulb of a drain, that is reportedly present, per the radiology technologist.  No unexpected radiopaque foreign body is identified.  IMPRESSION: No unexpected radio-opaque foreign body identified.   Original Report Authenticated By: Britta Mccreedy, M.D.     Anti-infectives: Anti-infectives     Start     Dose/Rate Route Frequency Ordered Stop   10/20/11 2015   piperacillin-tazobactam (ZOSYN) IVPB 3.375 g        3.375 g 12.5 mL/hr over 240 Minutes Intravenous 4 times per day 10/20/11 2013     10/20/11 0000   piperacillin-tazobactam (ZOSYN) IVPB 3.375 g  Status:  Discontinued        3.375 g 12.5 mL/hr over 240 Minutes Intravenous Every 8 hours 10/19/11 2328 10/20/11 1159   10/19/11 1745   piperacillin-tazobactam (ZOSYN) IVPB 3.375 g        3.375 g 12.5 mL/hr over 240 Minutes Intravenous  Once 10/19/11 1732 10/19/11 2200          Assessment/Plan: Patient Active Problem List  Diagnosis  . Ventral hernia, recurrent  . Anemia  . COPD (chronic obstructive pulmonary disease)  . Acute pancreatitis  . Pacemaker  . Prostate cancer  . Atrial fibrillation  . DVT,  lower extremity, distal, chronic  . Gall stone pancreatitis   s/p Procedure(s) (LRB) with comments: LAPAROSCOPIC CHOLECYSTECTOMY WITH INTRAOPERATIVE CHOLANGIOGRAM (N/A) CHOLECYSTECTOMY (N/A) - with intraoperative cholangiogram Advance diet  Ambulate/OOB/IS Discussed plan with medicine team and they concur (management of his other medical challenges per medicine) Will add colace today to help with BM ? Home today, will discuss with Dr. Eleonore Chiquito drainto remain until followw up appoint with Dr. Dwain Sarna, also he should be discharged on ABX for at least 10 days  time.    LOS: 6 days    Franklin Baumbach 10/25/2011

## 2011-10-26 ENCOUNTER — Telehealth (INDEPENDENT_AMBULATORY_CARE_PROVIDER_SITE_OTHER): Payer: Self-pay | Admitting: General Surgery

## 2011-10-26 NOTE — Telephone Encounter (Signed)
Pt called to ask if OK to shower yet.  Had surgery 3 days ago.  Has staples in umbilicus.  Gave pt OK to shower, reminded him not to rub the area---just let soapy water wash over it.  Pat dry.  Also OK to cover staples if they are catching on his clothing.

## 2011-10-26 NOTE — Discharge Summary (Signed)
Physician Discharge Summary  [name] [name]. ZOX:[id number] DOB: 1924/06/03 DOA: 10/19/2011  PCP: Ginette Otto, MD  Admit date: 10/19/2011 Discharge date: 10/26/2011  Recommendations for Outpatient Follow-up:  1. Patient will follow up with Gen. surgery in the next 2 weeks and continue on antibiotics the next 10 days  Discharge Diagnoses:  Principal Problem:  *Acute pancreatitis Active Problems:  Anemia  COPD (chronic obstructive pulmonary disease)  Pacemaker  Prostate cancer  Atrial fibrillation  DVT, lower extremity, distal, chronic  Gall stone pancreatitis   Discharge Condition: Improved, being discharged home  Diet recommendation: Heart healthy  Filed Weights   10/21/11 0636 10/22/11 0500 10/23/11 0400  Weight: 84 kg (185 lb 3 oz) 84.1 kg (185 lb 6.5 oz) 90.719 kg (200 lb)    History of present illness:  78 year old white male past we'll history of COPD and chronic anemia status post radiation treatment for prostate cancer who was having worsening abdominal pain for 2-3 weeks and presented with epigastric and right upper quadrant pain on 10/19/11. Patient was found to have acute pancreatitis as well as gallstones with mildly dilated common bile duct. Gastroenterology was initially consulted. Patient was admitted to the hospital service for further workup and management  Hospital Course:  Gall stone pancreatitis  -CT abdomen and pelvis 2.2 cm stone is identified in the neck of the gallbladder with other stones associated. Markedly distended gallbladder with gallstones and pericholecystic fluid. GI initially planned for ERCP been discussed with surgery felt best to defer to go straight to gallbladder we will. -Status post laparoscopic cholecystectomy on 10/23/11. By 9/8, he was able to start tolerating by mouth and felt to be stable and able to be discharged on 9/9. He requested several enemas and was able to move his bowels prior to discharge  Atrial Fib: from 9/6 to  9/7, converted to NSR last pm  Patient was started on Cardizem which was able to be stopped after 9/7. 2-D echo was done which noted a mildly decreased ejection fraction of 40-45%. TSH was normal. BNP was unremarkable for acute failure - had VQ scan 9/6 showed low prob of PE  -Chads2 score 2, not a candidate for anticoagulation at this time, with abd surgery   Chronic distal lower extremity DVT: R popliteal vein chronic DVT  -Chronic, asymptomatic, Dr.Feliz discussed with Dr.Ennever 9/6, recommended ASA 160mg  daily, this can be started when ok with surgery  In the meantime, monitoring. In the meantime, no treatment with anticoagulation   Anemia (12/10/2010) -stable, Fu with Dr.shahdad  -Continue to monitor   COPD (chronic obstructive pulmonary disease) (02/04/2011) -stable.   Pacemaker (10/19/2011)  Prostate cancer (10/19/2011) -f/u as an outpatient.    Procedures:  Laparoscopic cholecystectomy done 10/23/11  Consultations:  [facility]-gastroenterology  [place]-surgery  Discharge Exam: Filed Vitals:   10/24/11 0800 10/24/11 2100 10/25/11 0400 10/25/11 1351  BP:  124/71 112/64 129/69  Pulse:  59 61 59  Temp:  98.1 F (36.7 C) 98.1 F (36.7 C)   TempSrc:      Resp: 18 18 16    Height:      Weight:      SpO2: 96% 98% 94%     General: Alert and oriented x3, fatigue, looks about stated age Cardiovascular:  Regular rate and rhythm, S1-S2 Respiratory: Clear to auscultation bilaterally Abdomen: Soft, some mild tenderness especially in the right upper quadrant, hypoactive but present bowel sounds Extremities: No clubbing or cyanosis, trace pitting edema  Discharge Instructions  Discharge Orders  Future Appointments: Provider: Department: Dept Phone: Center:   11/02/2011 9:15 AM Krista Blue [facility] [phone] None   11/02/2011 9:45 AM [facility] [phone] None   11/30/2011 9:15 AM Beverely Pace Sh[facility]ate [facility] [phone]  None   11/30/2011 9:45 AM Myrtis Ser, NP [facility] [phone] None   11/30/2011 10:15 AM [facility] [phone] None     Future Orders Please Complete By Expires   Diet - low sodium heart healthy      Increase activity slowly      Walk with assistance        Medication List  As of 10/26/2011 10:41 PM   TAKE these medications         ARANESP (ALB FREE) SURECLICK 500 MCG/ML injection   Generic drug: darbepoetin alfa-polysorbate   Inject 500 mcg into the skin as needed.      aspirin 81 MG tablet   Take 81 mg by mouth daily.      CALCIUM PO   Take 2 tablets by mouth daily.      finasteride 5 MG tablet   Commonly known as: PROSCAR   Take 5 mg by mouth daily.      Fish Oil 1200 MG Caps   Take 1 capsule by mouth daily.      furosemide 40 MG tablet   Commonly known as: LASIX   Take 80 mg by mouth daily.      HYDROcodone-acetaminophen 5-500 MG per tablet   Commonly known as: VICODIN   Take 1 tablet by mouth every 6 (six) hours as needed.      ICAPS PO   Take 2 capsules by mouth 2 (two) times daily.      multivitamin with minerals tablet   Take 1 tablet by mouth daily.      levofloxacin 500 MG tablet   Commonly known as: LEVAQUIN   Take 1 tablet (500 mg total) by mouth daily.      lisinopril 20 MG tablet   Commonly known as: PRINIVIL,ZESTRIL   Take 10 mg by mouth daily.      omeprazole 20 MG capsule   Commonly known as: PRILOSEC   Take 20 mg by mouth 2 (two) times daily.      OVER THE COUNTER MEDICATION   Take 1 tablet by mouth at bedtime. Ruton 250 mg for bruising      polyethylene glycol powder powder   Commonly known as: GLYCOLAX/MIRALAX   Take 17 g by mouth daily as needed.           Follow-up Information    Follow up with [facility], MD in 2 weeks. (Call our office as needed, or if symptoms worsen)    Contact information:   [address] [place] [address] [phone]         Follow up with Ginette Otto, MD. Schedule an appointment as soon as possible for a visit in 1 month. (As needed)    Contact information:   [address] [place] [address] [place] [address] [phone]           The results of significant diagnostics from this hospitalization (including imaging, microbiology, ancillary and laboratory) are listed below for reference.    Significant Diagnostic Studies: Dg Chest 1 View  10/22/2011  IMPRESSION: No active cardiopulmonary disease.  Unchanged dual lead left subclavian cardiac pacemaker.   Original Report Authenticated By: Andreas Newport, M.D.  Dg Chest 2 View  10/19/2011 .  IMPRESSION: 1.  No radiographic evidence of acute cardiopulmonary disease. 2.  Chronic scarring in the left base again noted. 3.  Multiple old healed bilateral rib fractures.   Original Report Authenticated By: Florencia Reasons, M.D.    Dg Abd 1 View  10/23/2011  .  IMPRESSION: No unexpected radio-opaque foreign body identified.   Original Report Authenticated By: Britta Mccreedy, M.D.    Dg Cholangiogram Operative  10/23/2011   IMPRESSION:  Intraoperative cholangiogram as above.  No discrete filling defects to suggest the presence of choledocholithiasis.   Original Report Authenticated By: Waynard Reeds, M.D.    Nm Pulmonary Perfusion  10/22/2011   IMPRESSION:  1.  Low probability for acute pulmonary embolus.   Original Report Authenticated By: Rosealee Albee, M.D.    US Abdomen Complete  10/19/2011    IMPRESSION:  1.  Cholelithiasis with distended gallbladder and reported sonographic Murphy's sign.  Early cholecystitis cannot be excluded, however there is no gallbladder wall thickening. 2.  Mild biliary dilatation appears new compared with the abdominal CT of 5 months ago.  Choledocholithiasis should be considered.  Of note, the patient has a pacemaker and cannot undergo MRCP. 3.  No other acute or significant findings.   Original Report  Authenticated By: Gerrianne Scale, M.D.    Ct Abdomen Pelvis W Contrast  10/20/2011   IMPRESSION: Markedly distended gallbladder with gallstones and pericholecystic fluid.  There is associated intrahepatic biliary duct dilatation. Acute cholecystitis is a concern given these imaging features.  Small bilateral pleural effusions with compressive atelectasis in both lower lobes.  I discussed these findings by telephone with the Hospitalist PA Vernona Rieger [place]) at 2007 hours on 10/20/2011.   Original Report Authenticated By: ERIC A. MANSELL, M.D.        Labs: Basic Metabolic Panel:  Lab 10/25/11 4132 10/24/11 4401 10/22/11 0520 10/21/11 0759 10/20/11 2148 10/20/11 0956  NA 135 140 141 141 -- 141  K 3.7 4.3 3.8 4.0 -- 3.6  CL 103 107 106 104 -- 105  CO2 25 24 24 22  -- 23  GLUCOSE 95 109* 133* 129* -- 124*  BUN 44* 44* 49* 43* -- 30*  CREATININE 1.05 0.99 1.62* 1.63* -- 1.18  CALCIUM 8.6 8.8 8.8 9.1 -- 8.8  MG -- -- -- -- 1.7 --  PHOS -- -- -- -- -- --   Liver Function Tests:  Lab 10/24/11 0614 10/22/11 0520 10/21/11 0759 10/20/11 1001 10/20/11 0956  AST 65* 224* 358* 125* 124*  ALT 114* 201* 247* 115* 115*  ALKPHOS 348* 286* 317* 265* 263*  BILITOT 2.2* 3.2* 7.2* 8.3* 8.2*  PROT 5.3* 5.6* 5.8* 5.6* 5.5*  ALBUMIN 1.9* 2.2* 2.5* 2.7* 2.6*    Lab 10/22/11 0520 10/21/11 0759 10/20/11 0956  LIPASE 67* 928* 2466*  AMYLASE -- -- --   CBC:  Lab 10/25/11 0650 10/24/11 0614 10/22/11 0520 10/21/11 0759 10/20/11 1001  WBC 9.3 10.8* 19.0* 16.1* 14.9*  NEUTROABS -- -- 17.9* 15.6* 14.4*  HGB 10.2* 10.1* 10.6* 11.9* 10.9*  HCT 31.0* 31.7* 32.5* 35.9* 33.3*  MCV 93.9 96.1 95.6 96.2 97.1  PLT 106* 63* 61* 75* 124*   Cardiac Enzymes:  Lab 10/19/11 2354  CKTOTAL --  CKMB --  CKMBINDEX --  TROPONINI <0.30   CBG:  Lab 10/25/11 2000 10/25/11 1640 10/25/11 1128 10/25/11 0731 10/25/11 0355  GLUCAP 107* 97 104* 90 104*    Time coordinating discharge: 50  minutes  Signed:  Hollice Espy  [facility] 10/26/2011, 10:41 PM

## 2011-10-27 DIAGNOSIS — J449 Chronic obstructive pulmonary disease, unspecified: Secondary | ICD-10-CM | POA: Diagnosis not present

## 2011-10-27 DIAGNOSIS — G4733 Obstructive sleep apnea (adult) (pediatric): Secondary | ICD-10-CM | POA: Diagnosis not present

## 2011-10-27 DIAGNOSIS — R269 Unspecified abnormalities of gait and mobility: Secondary | ICD-10-CM | POA: Diagnosis not present

## 2011-10-27 DIAGNOSIS — I10 Essential (primary) hypertension: Secondary | ICD-10-CM | POA: Diagnosis not present

## 2011-10-27 DIAGNOSIS — M129 Arthropathy, unspecified: Secondary | ICD-10-CM | POA: Diagnosis not present

## 2011-10-28 ENCOUNTER — Other Ambulatory Visit (INDEPENDENT_AMBULATORY_CARE_PROVIDER_SITE_OTHER): Payer: Self-pay | Admitting: General Surgery

## 2011-10-28 ENCOUNTER — Telehealth (INDEPENDENT_AMBULATORY_CARE_PROVIDER_SITE_OTHER): Payer: Self-pay | Admitting: General Surgery

## 2011-10-28 DIAGNOSIS — I10 Essential (primary) hypertension: Secondary | ICD-10-CM | POA: Diagnosis not present

## 2011-10-28 DIAGNOSIS — R269 Unspecified abnormalities of gait and mobility: Secondary | ICD-10-CM | POA: Diagnosis not present

## 2011-10-28 DIAGNOSIS — J449 Chronic obstructive pulmonary disease, unspecified: Secondary | ICD-10-CM | POA: Diagnosis not present

## 2011-10-28 DIAGNOSIS — G4733 Obstructive sleep apnea (adult) (pediatric): Secondary | ICD-10-CM | POA: Diagnosis not present

## 2011-10-28 DIAGNOSIS — M129 Arthropathy, unspecified: Secondary | ICD-10-CM | POA: Diagnosis not present

## 2011-10-28 MED ORDER — ONDANSETRON HCL 4 MG PO TABS: 4.0000 mg | ORAL_TABLET | Freq: Three times a day (TID) | ORAL | Status: AC | PRN

## 2011-10-28 NOTE — Telephone Encounter (Signed)
MS Steedley CALLED TO REPORT THAT [name] [name] HAS HAD INTERMITTENT NAUSEA FOR SEVERAL DAYS/ NO VOMITING, NO FEVER, INCISION LOOKS UNREMARKABLE TO HER, NO DRAINAGE, PAIN HAAS IMPROVED AND HE IS TAKING TYLENOL. BOWELS ARE MOVING/ I REVIEWED THIS WITH DR. Dwain Sarna AND HE E-MAILED/ E-SCRIPT RX FOR ZOFRAN TO [facility]/ [phone]/PT'S WIFE NOTIFIED/GY

## 2011-10-29 DIAGNOSIS — I10 Essential (primary) hypertension: Secondary | ICD-10-CM | POA: Diagnosis not present

## 2011-10-29 DIAGNOSIS — J449 Chronic obstructive pulmonary disease, unspecified: Secondary | ICD-10-CM | POA: Diagnosis not present

## 2011-10-29 DIAGNOSIS — M129 Arthropathy, unspecified: Secondary | ICD-10-CM | POA: Diagnosis not present

## 2011-10-29 DIAGNOSIS — G4733 Obstructive sleep apnea (adult) (pediatric): Secondary | ICD-10-CM | POA: Diagnosis not present

## 2011-10-29 DIAGNOSIS — R269 Unspecified abnormalities of gait and mobility: Secondary | ICD-10-CM | POA: Diagnosis not present

## 2011-11-01 DIAGNOSIS — J449 Chronic obstructive pulmonary disease, unspecified: Secondary | ICD-10-CM | POA: Diagnosis not present

## 2011-11-01 DIAGNOSIS — R269 Unspecified abnormalities of gait and mobility: Secondary | ICD-10-CM | POA: Diagnosis not present

## 2011-11-01 DIAGNOSIS — M129 Arthropathy, unspecified: Secondary | ICD-10-CM | POA: Diagnosis not present

## 2011-11-01 DIAGNOSIS — I10 Essential (primary) hypertension: Secondary | ICD-10-CM | POA: Diagnosis not present

## 2011-11-01 DIAGNOSIS — G4733 Obstructive sleep apnea (adult) (pediatric): Secondary | ICD-10-CM | POA: Diagnosis not present

## 2011-11-02 ENCOUNTER — Ambulatory Visit (HOSPITAL_BASED_OUTPATIENT_CLINIC_OR_DEPARTMENT_OTHER): Payer: Medicare Other

## 2011-11-02 ENCOUNTER — Other Ambulatory Visit (HOSPITAL_BASED_OUTPATIENT_CLINIC_OR_DEPARTMENT_OTHER): Payer: Medicare Other | Admitting: Lab

## 2011-11-02 VITALS — BP 129/79 | HR 79 | Temp 98.0°F

## 2011-11-02 DIAGNOSIS — D649 Anemia, unspecified: Secondary | ICD-10-CM

## 2011-11-02 DIAGNOSIS — K432 Incisional hernia without obstruction or gangrene: Secondary | ICD-10-CM | POA: Diagnosis not present

## 2011-11-02 DIAGNOSIS — J449 Chronic obstructive pulmonary disease, unspecified: Secondary | ICD-10-CM

## 2011-11-02 LAB — CBC WITH DIFFERENTIAL/PLATELET
BASO%: 0.8 % (ref 0.0–2.0)
Basophils Absolute: 0.1 10*3/uL (ref 0.0–0.1)
EOS%: 4.5 % (ref 0.0–7.0)
HCT: 29.9 % — ABNORMAL LOW (ref 38.4–49.9)
HGB: 9.9 g/dL — ABNORMAL LOW (ref 13.0–17.1)
LYMPH%: 12.1 % — ABNORMAL LOW (ref 14.0–49.0)
MCH: 31.3 pg (ref 27.2–33.4)
MCHC: 32.9 g/dL (ref 32.0–36.0)
MCV: 95 fL (ref 79.3–98.0)
MONO%: 7.9 % (ref 0.0–14.0)
NEUT%: 74.7 % (ref 39.0–75.0)

## 2011-11-02 MED ORDER — DARBEPOETIN ALFA-POLYSORBATE 300 MCG/0.6ML IJ SOLN
300.0000 ug | Freq: Once | INTRAMUSCULAR | Status: AC
  Administered 2011-11-02: 300 ug via SUBCUTANEOUS
  Filled 2011-11-02: qty 0.6

## 2011-11-03 DIAGNOSIS — J449 Chronic obstructive pulmonary disease, unspecified: Secondary | ICD-10-CM | POA: Diagnosis not present

## 2011-11-03 DIAGNOSIS — G4733 Obstructive sleep apnea (adult) (pediatric): Secondary | ICD-10-CM | POA: Diagnosis not present

## 2011-11-03 DIAGNOSIS — M129 Arthropathy, unspecified: Secondary | ICD-10-CM | POA: Diagnosis not present

## 2011-11-03 DIAGNOSIS — R269 Unspecified abnormalities of gait and mobility: Secondary | ICD-10-CM | POA: Diagnosis not present

## 2011-11-03 DIAGNOSIS — I10 Essential (primary) hypertension: Secondary | ICD-10-CM | POA: Diagnosis not present

## 2011-11-08 ENCOUNTER — Ambulatory Visit (INDEPENDENT_AMBULATORY_CARE_PROVIDER_SITE_OTHER): Payer: Medicare Other | Admitting: General Surgery

## 2011-11-08 ENCOUNTER — Encounter (INDEPENDENT_AMBULATORY_CARE_PROVIDER_SITE_OTHER): Payer: Self-pay | Admitting: General Surgery

## 2011-11-08 VITALS — BP 120/70 | HR 80 | Temp 97.3°F | Resp 16 | Ht 70.0 in | Wt 190.4 lb

## 2011-11-08 DIAGNOSIS — Z09 Encounter for follow-up examination after completed treatment for conditions other than malignant neoplasm: Secondary | ICD-10-CM

## 2011-11-08 DIAGNOSIS — R5381 Other malaise: Secondary | ICD-10-CM | POA: Diagnosis not present

## 2011-11-08 DIAGNOSIS — I872 Venous insufficiency (chronic) (peripheral): Secondary | ICD-10-CM | POA: Diagnosis not present

## 2011-11-08 DIAGNOSIS — R609 Edema, unspecified: Secondary | ICD-10-CM | POA: Diagnosis not present

## 2011-11-08 DIAGNOSIS — Z79899 Other long term (current) drug therapy: Secondary | ICD-10-CM | POA: Diagnosis not present

## 2011-11-08 DIAGNOSIS — I10 Essential (primary) hypertension: Secondary | ICD-10-CM | POA: Diagnosis not present

## 2011-11-08 DIAGNOSIS — R5383 Other fatigue: Secondary | ICD-10-CM | POA: Diagnosis not present

## 2011-11-08 DIAGNOSIS — R0602 Shortness of breath: Secondary | ICD-10-CM | POA: Diagnosis not present

## 2011-11-09 ENCOUNTER — Encounter (INDEPENDENT_AMBULATORY_CARE_PROVIDER_SITE_OTHER): Payer: Self-pay | Admitting: General Surgery

## 2011-11-09 DIAGNOSIS — G4733 Obstructive sleep apnea (adult) (pediatric): Secondary | ICD-10-CM | POA: Diagnosis not present

## 2011-11-09 DIAGNOSIS — I10 Essential (primary) hypertension: Secondary | ICD-10-CM | POA: Diagnosis not present

## 2011-11-09 DIAGNOSIS — R269 Unspecified abnormalities of gait and mobility: Secondary | ICD-10-CM | POA: Diagnosis not present

## 2011-11-09 DIAGNOSIS — M129 Arthropathy, unspecified: Secondary | ICD-10-CM | POA: Diagnosis not present

## 2011-11-09 DIAGNOSIS — J449 Chronic obstructive pulmonary disease, unspecified: Secondary | ICD-10-CM | POA: Diagnosis not present

## 2011-11-09 NOTE — Progress Notes (Signed)
Subjective:     Patient ID: [name] [name]., male   DOB: Aug 21, 1924, 78 y.o.   MRN: [id number]  HPI 67 yom with necrotic gallbladder who I took to OR after several days in hospital when I was on call.  I attempted to do this laparoscopically but the adhesions from his recurrent ventral hernia repair with mesh were significant and his gallbladder was very diseased.  I elected to do open cholecystectomy which went well.  I left a drain in place.  He was discharged from hospital and has slowly improved.  He is eating more, ambulating, pain controlled.  Drain is putting out pretty minimal serous fluid at this point.  Review of Systems     Objective:   Physical Exam Well healing ruq incision with staples, luq incision from lap attempt healing well with staples    Assessment:     S/p open chole    Plan:     He is slowly improving and I encouraged ambulation, supplements.  I removed staples and applied steristrips today.  I also removed his drain. I will see back in 3 weeks.

## 2011-11-10 DIAGNOSIS — Z950 Presence of cardiac pacemaker: Secondary | ICD-10-CM | POA: Diagnosis not present

## 2011-11-10 DIAGNOSIS — I10 Essential (primary) hypertension: Secondary | ICD-10-CM | POA: Diagnosis not present

## 2011-11-10 DIAGNOSIS — R0602 Shortness of breath: Secondary | ICD-10-CM | POA: Diagnosis not present

## 2011-11-10 DIAGNOSIS — I4891 Unspecified atrial fibrillation: Secondary | ICD-10-CM | POA: Diagnosis not present

## 2011-11-10 DIAGNOSIS — R609 Edema, unspecified: Secondary | ICD-10-CM | POA: Diagnosis not present

## 2011-11-11 DIAGNOSIS — I10 Essential (primary) hypertension: Secondary | ICD-10-CM | POA: Diagnosis not present

## 2011-11-11 DIAGNOSIS — M129 Arthropathy, unspecified: Secondary | ICD-10-CM | POA: Diagnosis not present

## 2011-11-11 DIAGNOSIS — J449 Chronic obstructive pulmonary disease, unspecified: Secondary | ICD-10-CM | POA: Diagnosis not present

## 2011-11-11 DIAGNOSIS — R269 Unspecified abnormalities of gait and mobility: Secondary | ICD-10-CM | POA: Diagnosis not present

## 2011-11-11 DIAGNOSIS — G4733 Obstructive sleep apnea (adult) (pediatric): Secondary | ICD-10-CM | POA: Diagnosis not present

## 2011-11-15 DIAGNOSIS — J449 Chronic obstructive pulmonary disease, unspecified: Secondary | ICD-10-CM | POA: Diagnosis not present

## 2011-11-15 DIAGNOSIS — I10 Essential (primary) hypertension: Secondary | ICD-10-CM | POA: Diagnosis not present

## 2011-11-15 DIAGNOSIS — M129 Arthropathy, unspecified: Secondary | ICD-10-CM | POA: Diagnosis not present

## 2011-11-15 DIAGNOSIS — R269 Unspecified abnormalities of gait and mobility: Secondary | ICD-10-CM | POA: Diagnosis not present

## 2011-11-15 DIAGNOSIS — G4733 Obstructive sleep apnea (adult) (pediatric): Secondary | ICD-10-CM | POA: Diagnosis not present

## 2011-11-16 DIAGNOSIS — R0602 Shortness of breath: Secondary | ICD-10-CM | POA: Diagnosis not present

## 2011-11-16 DIAGNOSIS — Z950 Presence of cardiac pacemaker: Secondary | ICD-10-CM | POA: Diagnosis not present

## 2011-11-17 DIAGNOSIS — M129 Arthropathy, unspecified: Secondary | ICD-10-CM | POA: Diagnosis not present

## 2011-11-17 DIAGNOSIS — I10 Essential (primary) hypertension: Secondary | ICD-10-CM | POA: Diagnosis not present

## 2011-11-17 DIAGNOSIS — G4733 Obstructive sleep apnea (adult) (pediatric): Secondary | ICD-10-CM | POA: Diagnosis not present

## 2011-11-17 DIAGNOSIS — R269 Unspecified abnormalities of gait and mobility: Secondary | ICD-10-CM | POA: Diagnosis not present

## 2011-11-17 DIAGNOSIS — J449 Chronic obstructive pulmonary disease, unspecified: Secondary | ICD-10-CM | POA: Diagnosis not present

## 2011-11-18 DIAGNOSIS — R0602 Shortness of breath: Secondary | ICD-10-CM | POA: Diagnosis not present

## 2011-11-19 DIAGNOSIS — R269 Unspecified abnormalities of gait and mobility: Secondary | ICD-10-CM | POA: Diagnosis not present

## 2011-11-19 DIAGNOSIS — G4733 Obstructive sleep apnea (adult) (pediatric): Secondary | ICD-10-CM | POA: Diagnosis not present

## 2011-11-19 DIAGNOSIS — I10 Essential (primary) hypertension: Secondary | ICD-10-CM | POA: Diagnosis not present

## 2011-11-19 DIAGNOSIS — M129 Arthropathy, unspecified: Secondary | ICD-10-CM | POA: Diagnosis not present

## 2011-11-19 DIAGNOSIS — J449 Chronic obstructive pulmonary disease, unspecified: Secondary | ICD-10-CM | POA: Diagnosis not present

## 2011-11-30 ENCOUNTER — Telehealth: Payer: Self-pay | Admitting: Oncology

## 2011-11-30 ENCOUNTER — Other Ambulatory Visit (HOSPITAL_BASED_OUTPATIENT_CLINIC_OR_DEPARTMENT_OTHER): Payer: Medicare Other | Admitting: Lab

## 2011-11-30 ENCOUNTER — Ambulatory Visit (HOSPITAL_BASED_OUTPATIENT_CLINIC_OR_DEPARTMENT_OTHER): Payer: Medicare Other

## 2011-11-30 ENCOUNTER — Ambulatory Visit (HOSPITAL_BASED_OUTPATIENT_CLINIC_OR_DEPARTMENT_OTHER): Payer: Medicare Other | Admitting: Oncology

## 2011-11-30 ENCOUNTER — Encounter: Payer: Self-pay | Admitting: Oncology

## 2011-11-30 VITALS — BP 130/71 | HR 87 | Temp 97.0°F | Resp 20 | Ht 70.0 in | Wt 187.2 lb

## 2011-11-30 DIAGNOSIS — D649 Anemia, unspecified: Secondary | ICD-10-CM | POA: Diagnosis not present

## 2011-11-30 DIAGNOSIS — K432 Incisional hernia without obstruction or gangrene: Secondary | ICD-10-CM

## 2011-11-30 DIAGNOSIS — Z8546 Personal history of malignant neoplasm of prostate: Secondary | ICD-10-CM | POA: Diagnosis not present

## 2011-11-30 DIAGNOSIS — J449 Chronic obstructive pulmonary disease, unspecified: Secondary | ICD-10-CM

## 2011-11-30 LAB — CBC WITH DIFFERENTIAL/PLATELET
BASO%: 0.6 % (ref 0.0–2.0)
Basophils Absolute: 0 10*3/uL (ref 0.0–0.1)
EOS%: 11 % — ABNORMAL HIGH (ref 0.0–7.0)
MCH: 30.9 pg (ref 27.2–33.4)
MCHC: 33.2 g/dL (ref 32.0–36.0)
MCV: 93 fL (ref 79.3–98.0)
MONO%: 6 % (ref 0.0–14.0)
RBC: 3.44 10*6/uL — ABNORMAL LOW (ref 4.20–5.82)
RDW: 14.9 % — ABNORMAL HIGH (ref 11.0–14.6)
lymph#: 1.4 10*3/uL (ref 0.9–3.3)

## 2011-11-30 MED ORDER — DARBEPOETIN ALFA-POLYSORBATE 300 MCG/0.6ML IJ SOLN
300.0000 ug | Freq: Once | INTRAMUSCULAR | Status: AC
  Administered 2011-11-30: 300 ug via SUBCUTANEOUS
  Filled 2011-11-30: qty 0.6

## 2011-11-30 NOTE — Telephone Encounter (Signed)
Gave pt appt for November, December and January 2014, labs, injections and MD

## 2011-11-30 NOTE — Progress Notes (Signed)
Hematology and Oncology Follow Up Visit  [name] [name] [id number] Nov 17, 1924 78 y.o. 11/30/2011 9:52 AM  CC: [name] [name], M.D.  [name] Fells. [name] [name], M.D.    Principle Diagnosis:  78 year old gentleman with the following diagnoses. 1. Multifactorial anemia.  He has anemia of chronic disease.  It may be anemia of renal insufficiency. 2. History of prostate cancer status post radiation therapy completed in 2008.  Current therapy: He is on Aranesp 300 mcg subcutaneously every 4 weeks to keep his hemoglobin above 11.  Interim History: [name] [name] presents today for a followup visit.  Pleasant gentleman with a few comorbid conditions that include hypertension as well as prostate cancer.  He also has anemia of chronic disease as outlined above.  He had been receiving Aranesp as mentioned to keep his hemoglobin above 11 and had been doing relatively well with it.  He had not had any complications associated with it. He was hospitalized recently for a cholecystectomy. He is no longer having pain at his surgical site and is recovering well. No new complaints.  Medications: I have reviewed the patient's current medications. Current outpatient prescriptions:aspirin 81 MG tablet, Take 81 mg by mouth daily.  , Disp: , Rfl: ;  CALCIUM PO, Take 2 tablets by mouth daily., Disp: , Rfl: ;  darbepoetin alfa-polysorbate (ARANESP, ALB FREE, SURECLICK) 500 MCG/ML injection, Inject 500 mcg into the skin as needed.  , Disp: , Rfl: ;  furosemide (LASIX) 40 MG tablet, Take 80 mg by mouth daily. , Disp: , Rfl:  HYDROcodone-acetaminophen (VICODIN) 5-500 MG per tablet, Take 1 tablet by mouth every 6 (six) hours as needed.  , Disp: , Rfl: ;  lisinopril (PRINIVIL,ZESTRIL) 20 MG tablet, Take 10 mg by mouth daily., Disp: , Rfl: ;  Multiple Vitamins-Minerals (ICAPS PO), Take 2 capsules by mouth 2 (two) times daily. , Disp: , Rfl: ;  Multiple Vitamins-Minerals (MULTIVITAMIN WITH MINERALS) tablet, Take 1 tablet by mouth  daily.  , Disp: , Rfl:  Omega-3 Fatty Acids (FISH OIL) 1200 MG CAPS, Take 1 capsule by mouth daily.  , Disp: , Rfl: ;  omeprazole (PRILOSEC) 20 MG capsule, Take 20 mg by mouth 2 (two) times daily. , Disp: , Rfl: ;  OVER THE COUNTER MEDICATION, Take 1 tablet by mouth at bedtime. Ruton 250 mg for bruising, Disp: , Rfl: ;  finasteride (PROSCAR) 5 MG tablet, Take 5 mg by mouth daily.  , Disp: , Rfl:   Allergies: No Known Allergies  Past Medical History, Surgical history, Social history, and Family History were reviewed and updated.  Review of Systems: Constitutional:  Negative for fever, chills, night sweats, anorexia, weight loss, pain. Cardiovascular: no chest pain or dyspnea on exertion Respiratory: no cough, shortness of breath, or wheezing Neurological: no TIA or stroke symptoms Dermatological: negative ENT: negative Skin: Negative. Gastrointestinal: no abdominal pain, change in bowel habits, or black or bloody stools Genito-Urinary: negative Hematological and Lymphatic: negative Breast: negative Musculoskeletal: negative Remaining ROS negative.  Physical Exam: Blood pressure 130/71, pulse 87, temperature 97 F (36.1 C), temperature source Oral, resp. rate 20, height 5\' 10"  (1.778 m), weight 187 lb 3.2 oz (84.913 kg). ECOG: 1 General appearance: alert Head: Normocephalic, without obvious abnormality, atraumatic Neck: no adenopathy, no carotid bruit, no JVD, supple, symmetrical, trachea midline and thyroid not enlarged, symmetric, no tenderness/mass/nodules Lymph nodes: Cervical, supraclavicular, and axillary nodes normal. Heart:regular rate and rhythm, S1, S2 normal, no murmur, click, rub or gallop Lung:chest clear, no wheezing, rales, normal  symmetric air entry Abdomen: soft, non-tender, without masses or organomegaly EXT:no erythema, induration, or nodules   Lab Results: Lab Results  Component Value Date   WBC 6.9 11/30/2011   HGB 10.6* 11/30/2011   HCT 32.0* 11/30/2011    MCV 93.0 11/30/2011   PLT 162 11/30/2011     Chemistry      Component Value Date/Time   NA 135 10/25/2011 0650   K 3.7 10/25/2011 0650   CL 103 10/25/2011 0650   CO2 25 10/25/2011 0650   BUN 44* 10/25/2011 0650   CREATININE 1.05 10/25/2011 0650      Component Value Date/Time   CALCIUM 8.6 10/25/2011 0650   ALKPHOS 348* 10/24/2011 0614   AST 65* 10/24/2011 0614   ALT 114* 10/24/2011 0614   BILITOT 2.2* 10/24/2011 0614       Impression and Plan:  78 year old gentleman with the following issues. 1. Multifactorial anemia.  There is an element of anemia of renal insufficiency.  Currently on Aranesp 300 mcg to keep his hemoglobin above 11.  We will proceed today with therapy and we will monitor him on a monthly basis to keep his hemoglobin, as mentioned, above 11.  He also had not reported any specific complications associated with Aranesp. 2. Prostate cancer.  No relapse at this point. He sees Dr [name] [name] in November for routine follow-up. 3. Follow-up. Aranesp injection every 4 weeks. Visit in 3-4 months.  [name] [name] 10/15/20139:52 AM

## 2011-12-06 ENCOUNTER — Encounter (INDEPENDENT_AMBULATORY_CARE_PROVIDER_SITE_OTHER): Payer: Self-pay | Admitting: General Surgery

## 2011-12-06 ENCOUNTER — Ambulatory Visit (INDEPENDENT_AMBULATORY_CARE_PROVIDER_SITE_OTHER): Payer: Medicare Other | Admitting: General Surgery

## 2011-12-06 VITALS — BP 128/60 | HR 76 | Temp 97.2°F | Resp 16 | Ht 70.0 in | Wt 186.6 lb

## 2011-12-06 DIAGNOSIS — Z09 Encounter for follow-up examination after completed treatment for conditions other than malignant neoplasm: Secondary | ICD-10-CM

## 2011-12-06 NOTE — Progress Notes (Signed)
Subjective:     Patient ID: [name] [name]., male   DOB: Nov 19, 1924, 78 y.o.   MRN: [id number]  HPI  89 yom with necrotic gallbladder who I took to OR after several days in hospital when I was on call. I attempted to do this laparoscopically but the adhesions from his recurrent ventral hernia repair with mesh were significant and his gallbladder was very diseased. I elected to do open cholecystectomy which went well. I left a drain in place. I removed drain previously.  He returns today doing very well.   Review of Systems     Objective:   Physical Exam Well healed ruq incision without infection    Assessment:     S/p open chole    Plan:     He can return to normal activity. I released him to go back to work on November 6. I will see him back as needed.

## 2011-12-07 ENCOUNTER — Ambulatory Visit: Payer: Medicare Other | Admitting: Oncology

## 2011-12-09 DIAGNOSIS — L259 Unspecified contact dermatitis, unspecified cause: Secondary | ICD-10-CM | POA: Diagnosis not present

## 2011-12-09 DIAGNOSIS — L570 Actinic keratosis: Secondary | ICD-10-CM | POA: Diagnosis not present

## 2011-12-13 DIAGNOSIS — I4891 Unspecified atrial fibrillation: Secondary | ICD-10-CM | POA: Diagnosis not present

## 2011-12-13 DIAGNOSIS — R0602 Shortness of breath: Secondary | ICD-10-CM | POA: Diagnosis not present

## 2011-12-13 DIAGNOSIS — I10 Essential (primary) hypertension: Secondary | ICD-10-CM | POA: Diagnosis not present

## 2011-12-15 ENCOUNTER — Encounter (INDEPENDENT_AMBULATORY_CARE_PROVIDER_SITE_OTHER): Payer: Self-pay | Admitting: General Surgery

## 2011-12-15 DIAGNOSIS — M779 Enthesopathy, unspecified: Secondary | ICD-10-CM | POA: Diagnosis not present

## 2011-12-17 DIAGNOSIS — H902 Conductive hearing loss, unspecified: Secondary | ICD-10-CM | POA: Diagnosis not present

## 2011-12-17 DIAGNOSIS — H6120 Impacted cerumen, unspecified ear: Secondary | ICD-10-CM | POA: Diagnosis not present

## 2011-12-27 DIAGNOSIS — M5137 Other intervertebral disc degeneration, lumbosacral region: Secondary | ICD-10-CM | POA: Diagnosis not present

## 2011-12-27 DIAGNOSIS — M999 Biomechanical lesion, unspecified: Secondary | ICD-10-CM | POA: Diagnosis not present

## 2011-12-27 DIAGNOSIS — M25559 Pain in unspecified hip: Secondary | ICD-10-CM | POA: Diagnosis not present

## 2011-12-28 ENCOUNTER — Ambulatory Visit (HOSPITAL_BASED_OUTPATIENT_CLINIC_OR_DEPARTMENT_OTHER): Payer: Medicare Other

## 2011-12-28 ENCOUNTER — Other Ambulatory Visit (HOSPITAL_BASED_OUTPATIENT_CLINIC_OR_DEPARTMENT_OTHER): Payer: Medicare Other

## 2011-12-28 VITALS — BP 121/71 | HR 81 | Temp 97.2°F

## 2011-12-28 DIAGNOSIS — D649 Anemia, unspecified: Secondary | ICD-10-CM | POA: Diagnosis not present

## 2011-12-28 DIAGNOSIS — K432 Incisional hernia without obstruction or gangrene: Secondary | ICD-10-CM

## 2011-12-28 DIAGNOSIS — J449 Chronic obstructive pulmonary disease, unspecified: Secondary | ICD-10-CM

## 2011-12-28 LAB — CBC WITH DIFFERENTIAL/PLATELET
Eosinophils Absolute: 0.2 10*3/uL (ref 0.0–0.5)
MCV: 92.4 fL (ref 79.3–98.0)
MONO%: 5.1 % (ref 0.0–14.0)
NEUT#: 5.1 10*3/uL (ref 1.5–6.5)
RBC: 3.52 10*6/uL — ABNORMAL LOW (ref 4.20–5.82)
RDW: 15.4 % — ABNORMAL HIGH (ref 11.0–14.6)
WBC: 7.1 10*3/uL (ref 4.0–10.3)
lymph#: 1.4 10*3/uL (ref 0.9–3.3)

## 2011-12-28 MED ORDER — DARBEPOETIN ALFA-POLYSORBATE 300 MCG/0.6ML IJ SOLN
300.0000 ug | Freq: Once | INTRAMUSCULAR | Status: AC
  Administered 2011-12-28: 300 ug via SUBCUTANEOUS
  Filled 2011-12-28: qty 0.6

## 2012-01-06 DIAGNOSIS — R1310 Dysphagia, unspecified: Secondary | ICD-10-CM | POA: Diagnosis not present

## 2012-01-06 DIAGNOSIS — K222 Esophageal obstruction: Secondary | ICD-10-CM | POA: Diagnosis not present

## 2012-01-06 DIAGNOSIS — K449 Diaphragmatic hernia without obstruction or gangrene: Secondary | ICD-10-CM | POA: Diagnosis not present

## 2012-01-10 DIAGNOSIS — H52209 Unspecified astigmatism, unspecified eye: Secondary | ICD-10-CM | POA: Diagnosis not present

## 2012-01-10 DIAGNOSIS — I10 Essential (primary) hypertension: Secondary | ICD-10-CM | POA: Diagnosis not present

## 2012-01-10 DIAGNOSIS — G4730 Sleep apnea, unspecified: Secondary | ICD-10-CM | POA: Diagnosis not present

## 2012-01-10 DIAGNOSIS — Z961 Presence of intraocular lens: Secondary | ICD-10-CM | POA: Diagnosis not present

## 2012-01-10 DIAGNOSIS — H3530 Unspecified macular degeneration: Secondary | ICD-10-CM | POA: Diagnosis not present

## 2012-01-20 DIAGNOSIS — G609 Hereditary and idiopathic neuropathy, unspecified: Secondary | ICD-10-CM | POA: Diagnosis not present

## 2012-01-20 DIAGNOSIS — M47817 Spondylosis without myelopathy or radiculopathy, lumbosacral region: Secondary | ICD-10-CM | POA: Diagnosis not present

## 2012-01-20 DIAGNOSIS — M159 Polyosteoarthritis, unspecified: Secondary | ICD-10-CM | POA: Diagnosis not present

## 2012-01-20 DIAGNOSIS — G894 Chronic pain syndrome: Secondary | ICD-10-CM | POA: Diagnosis not present

## 2012-01-25 ENCOUNTER — Ambulatory Visit: Payer: Medicare Other

## 2012-01-25 ENCOUNTER — Other Ambulatory Visit (HOSPITAL_BASED_OUTPATIENT_CLINIC_OR_DEPARTMENT_OTHER): Payer: Medicare Other | Admitting: Lab

## 2012-01-25 DIAGNOSIS — K432 Incisional hernia without obstruction or gangrene: Secondary | ICD-10-CM

## 2012-01-25 DIAGNOSIS — J449 Chronic obstructive pulmonary disease, unspecified: Secondary | ICD-10-CM | POA: Diagnosis not present

## 2012-01-25 DIAGNOSIS — D649 Anemia, unspecified: Secondary | ICD-10-CM

## 2012-01-25 LAB — CBC WITH DIFFERENTIAL/PLATELET
Eosinophils Absolute: 0.2 10*3/uL (ref 0.0–0.5)
HGB: 11.3 g/dL — ABNORMAL LOW (ref 13.0–17.1)
LYMPH%: 24.2 % (ref 14.0–49.0)
MONO#: 0.4 10*3/uL (ref 0.1–0.9)
NEUT#: 4.5 10*3/uL (ref 1.5–6.5)
Platelets: 156 10*3/uL (ref 140–400)
RBC: 3.77 10*6/uL — ABNORMAL LOW (ref 4.20–5.82)
RDW: 15.7 % — ABNORMAL HIGH (ref 11.0–14.6)
WBC: 6.7 10*3/uL (ref 4.0–10.3)

## 2012-01-25 MED ORDER — DARBEPOETIN ALFA-POLYSORBATE 300 MCG/0.6ML IJ SOLN: 300.0000 ug | Freq: Once | INTRAMUSCULAR | Status: DC

## 2012-01-28 DIAGNOSIS — Z8546 Personal history of malignant neoplasm of prostate: Secondary | ICD-10-CM | POA: Diagnosis not present

## 2012-02-15 DIAGNOSIS — N401 Enlarged prostate with lower urinary tract symptoms: Secondary | ICD-10-CM | POA: Diagnosis not present

## 2012-02-15 DIAGNOSIS — R3915 Urgency of urination: Secondary | ICD-10-CM | POA: Diagnosis not present

## 2012-02-15 DIAGNOSIS — N318 Other neuromuscular dysfunction of bladder: Secondary | ICD-10-CM | POA: Diagnosis not present

## 2012-02-15 DIAGNOSIS — Z8546 Personal history of malignant neoplasm of prostate: Secondary | ICD-10-CM | POA: Diagnosis not present

## 2012-02-22 ENCOUNTER — Ambulatory Visit (HOSPITAL_BASED_OUTPATIENT_CLINIC_OR_DEPARTMENT_OTHER): Payer: Medicare Other | Admitting: Oncology

## 2012-02-22 ENCOUNTER — Other Ambulatory Visit (HOSPITAL_BASED_OUTPATIENT_CLINIC_OR_DEPARTMENT_OTHER): Payer: Medicare Other | Admitting: Lab

## 2012-02-22 ENCOUNTER — Telehealth: Payer: Self-pay | Admitting: Oncology

## 2012-02-22 ENCOUNTER — Ambulatory Visit: Payer: Medicare Other

## 2012-02-22 VITALS — BP 126/71 | HR 87 | Temp 96.8°F | Resp 20 | Ht 70.0 in | Wt 187.8 lb

## 2012-02-22 DIAGNOSIS — K432 Incisional hernia without obstruction or gangrene: Secondary | ICD-10-CM

## 2012-02-22 DIAGNOSIS — N039 Chronic nephritic syndrome with unspecified morphologic changes: Secondary | ICD-10-CM

## 2012-02-22 DIAGNOSIS — I10 Essential (primary) hypertension: Secondary | ICD-10-CM | POA: Diagnosis not present

## 2012-02-22 DIAGNOSIS — C61 Malignant neoplasm of prostate: Secondary | ICD-10-CM | POA: Diagnosis not present

## 2012-02-22 DIAGNOSIS — D649 Anemia, unspecified: Secondary | ICD-10-CM

## 2012-02-22 DIAGNOSIS — J449 Chronic obstructive pulmonary disease, unspecified: Secondary | ICD-10-CM

## 2012-02-22 LAB — CBC WITH DIFFERENTIAL/PLATELET
Eosinophils Absolute: 0.2 10*3/uL (ref 0.0–0.5)
HCT: 32.9 % — ABNORMAL LOW (ref 38.4–49.9)
LYMPH%: 23.2 % (ref 14.0–49.0)
MONO#: 0.3 10*3/uL (ref 0.1–0.9)
NEUT#: 4.4 10*3/uL (ref 1.5–6.5)
NEUT%: 67.9 % (ref 39.0–75.0)
Platelets: 124 10*3/uL — ABNORMAL LOW (ref 140–400)
WBC: 6.4 10*3/uL (ref 4.0–10.3)

## 2012-02-22 NOTE — Telephone Encounter (Signed)
appts made and printed for pt aom °

## 2012-02-22 NOTE — Progress Notes (Signed)
Hematology and Oncology Follow Up Visit  [name] [name] [id number] 1924-06-12 79 y.o. 02/22/2012 4:04 PM  CC: [name] [name], M.D.  [name] [name], M.D.    Principle Diagnosis:  79 year old gentleman with the following diagnoses. 1. Multifactorial anemia.  He has anemia of chronic disease.  It may be anemia of renal insufficiency. 2. History of prostate cancer status post radiation therapy completed in 2008.  Current therapy: He is on Aranesp 300 mcg subcutaneously every 4 weeks to keep his hemoglobin above 11.  Interim History: [name] [name] presents today for a followup visit.  Pleasant gentleman with a few comorbid conditions that include hypertension as well as prostate cancer.  He also has anemia of chronic disease as outlined above.  He had been receiving Aranesp as mentioned to keep his hemoglobin above 11 and had been doing relatively well with it.  He had not had any complications associated with it.  No new complaints since the last visit.   Medications: I have reviewed the patient's current medications. Current outpatient prescriptions:aspirin 81 MG tablet, Take 81 mg by mouth daily.  , Disp: , Rfl: ;  CALCIUM PO, Take 2 tablets by mouth daily., Disp: , Rfl: ;  darbepoetin alfa-polysorbate (ARANESP, ALB FREE, SURECLICK) 500 MCG/ML injection, Inject 500 mcg into the skin as needed.  , Disp: , Rfl: ;  finasteride (PROSCAR) 5 MG tablet, Take 5 mg by mouth daily.  , Disp: , Rfl:  furosemide (LASIX) 40 MG tablet, Take 80 mg by mouth daily. , Disp: , Rfl: ;  HYDROcodone-acetaminophen (VICODIN) 5-500 MG per tablet, Take 1 tablet by mouth every 6 (six) hours as needed.  , Disp: , Rfl: ;  lisinopril (PRINIVIL,ZESTRIL) 20 MG tablet, Take 10 mg by mouth daily., Disp: , Rfl: ;  Multiple Vitamins-Minerals (ICAPS PO), Take 2 capsules by mouth 2 (two) times daily. , Disp: , Rfl:  Multiple Vitamins-Minerals (MULTIVITAMIN WITH MINERALS) tablet, Take 1 tablet by mouth daily.  , Disp: , Rfl: ;   Omega-3 Fatty Acids (FISH OIL) 1200 MG CAPS, Take 1 capsule by mouth daily.  , Disp: , Rfl: ;  omeprazole (PRILOSEC) 20 MG capsule, Take 20 mg by mouth 2 (two) times daily. , Disp: , Rfl: ;  OVER THE COUNTER MEDICATION, Take 1 tablet by mouth at bedtime. Ruton 250 mg for bruising, Disp: , Rfl:   Allergies: No Known Allergies  Past Medical History, Surgical history, Social history, and Family History were reviewed and updated.  Review of Systems: Constitutional:  Negative for fever, chills, night sweats, anorexia, weight loss, pain. Cardiovascular: no chest pain or dyspnea on exertion Respiratory: no cough, shortness of breath, or wheezing Neurological: no TIA or stroke symptoms Dermatological: negative ENT: negative Skin: Negative. Gastrointestinal: no abdominal pain, change in bowel habits, or black or bloody stools Genito-Urinary: negative Hematological and Lymphatic: negative Breast: negative Musculoskeletal: negative Remaining ROS negative.  Physical Exam: Blood pressure 126/71, pulse 87, temperature 96.8 F (36 C), temperature source Oral, resp. rate 20, height 5\' 10"  (1.778 m), weight 187 lb 12.8 oz (85.186 kg). ECOG: 1 General appearance: alert Head: Normocephalic, without obvious abnormality, atraumatic Neck: no adenopathy, no carotid bruit, no JVD, supple, symmetrical, trachea midline and thyroid not enlarged, symmetric, no tenderness/mass/nodules Lymph nodes: Cervical, supraclavicular, and axillary nodes normal. Heart:regular rate and rhythm, S1, S2 normal, no murmur, click, rub or gallop Lung:chest clear, no wheezing, rales, normal symmetric air entry Abdomen: soft, non-tender, without masses or organomegaly EXT:no erythema, induration, or nodules  Lab Results: Lab Results  Component Value Date   WBC 6.4 02/22/2012   HGB 11.1* 02/22/2012   HCT 32.9* 02/22/2012   MCV 92.1 02/22/2012   PLT 124* 02/22/2012     Chemistry      Component Value Date/Time   NA 135 10/25/2011  0650   K 3.7 10/25/2011 0650   CL 103 10/25/2011 0650   CO2 25 10/25/2011 0650   BUN 44* 10/25/2011 0650   CREATININE 1.05 10/25/2011 0650      Component Value Date/Time   CALCIUM 8.6 10/25/2011 0650   ALKPHOS 348* 10/24/2011 0614   AST 65* 10/24/2011 0614   ALT 114* 10/24/2011 0614   BILITOT 2.2* 10/24/2011 0614       Impression and Plan:  79 year old gentleman with the following issues. 1. Multifactorial anemia.  There is an element of anemia of renal insufficiency.  Currently on Aranesp 300 mcg to keep his hemoglobin above 11.  He will not need an injection today but we will monitor him on a monthly basis to keep his hemoglobin, as mentioned, above 11.  He also had not reported any specific complications associated with Aranesp. 2. Prostate cancer.  No relapse at this point. He sees Dr Vernie [name] in November for routine follow-up. 3. Follow-up. Aranesp injection every 4 weeks. Visit in 6 months.  [name] [name] 1/7/20144:04 PM

## 2012-02-29 ENCOUNTER — Ambulatory Visit: Payer: Medicare Other | Admitting: Oncology

## 2012-02-29 ENCOUNTER — Other Ambulatory Visit: Payer: Medicare Other | Admitting: Lab

## 2012-03-21 ENCOUNTER — Other Ambulatory Visit (HOSPITAL_BASED_OUTPATIENT_CLINIC_OR_DEPARTMENT_OTHER): Payer: Medicare Other | Admitting: Lab

## 2012-03-21 ENCOUNTER — Ambulatory Visit (HOSPITAL_BASED_OUTPATIENT_CLINIC_OR_DEPARTMENT_OTHER): Payer: Medicare Other

## 2012-03-21 VITALS — BP 128/64 | HR 65 | Temp 97.2°F

## 2012-03-21 DIAGNOSIS — N289 Disorder of kidney and ureter, unspecified: Secondary | ICD-10-CM | POA: Diagnosis not present

## 2012-03-21 DIAGNOSIS — K432 Incisional hernia without obstruction or gangrene: Secondary | ICD-10-CM

## 2012-03-21 DIAGNOSIS — J449 Chronic obstructive pulmonary disease, unspecified: Secondary | ICD-10-CM

## 2012-03-21 DIAGNOSIS — D649 Anemia, unspecified: Secondary | ICD-10-CM

## 2012-03-21 LAB — CBC WITH DIFFERENTIAL/PLATELET
BASO%: 0.2 % (ref 0.0–2.0)
EOS%: 3.3 % (ref 0.0–7.0)
HCT: 32.7 % — ABNORMAL LOW (ref 38.4–49.9)
HGB: 10.6 g/dL — ABNORMAL LOW (ref 13.0–17.1)
MCH: 30.5 pg (ref 27.2–33.4)
MCHC: 32.4 g/dL (ref 32.0–36.0)
MONO#: 0.2 10*3/uL (ref 0.1–0.9)
NEUT%: 66.5 % (ref 39.0–75.0)
RDW: 15.2 % — ABNORMAL HIGH (ref 11.0–14.6)
WBC: 5.8 10*3/uL (ref 4.0–10.3)
lymph#: 1.5 10*3/uL (ref 0.9–3.3)

## 2012-03-21 MED ORDER — DARBEPOETIN ALFA-POLYSORBATE 300 MCG/0.6ML IJ SOLN
300.0000 ug | Freq: Once | INTRAMUSCULAR | Status: AC
  Administered 2012-03-21: 300 ug via SUBCUTANEOUS
  Filled 2012-03-21: qty 0.6

## 2012-03-23 ENCOUNTER — Encounter: Payer: Self-pay | Admitting: *Deleted

## 2012-03-27 DIAGNOSIS — I4891 Unspecified atrial fibrillation: Secondary | ICD-10-CM | POA: Diagnosis not present

## 2012-03-27 DIAGNOSIS — R609 Edema, unspecified: Secondary | ICD-10-CM | POA: Diagnosis not present

## 2012-03-27 DIAGNOSIS — Z950 Presence of cardiac pacemaker: Secondary | ICD-10-CM | POA: Diagnosis not present

## 2012-03-27 DIAGNOSIS — I10 Essential (primary) hypertension: Secondary | ICD-10-CM | POA: Diagnosis not present

## 2012-03-27 DIAGNOSIS — R0602 Shortness of breath: Secondary | ICD-10-CM | POA: Diagnosis not present

## 2012-04-18 ENCOUNTER — Other Ambulatory Visit (HOSPITAL_BASED_OUTPATIENT_CLINIC_OR_DEPARTMENT_OTHER): Payer: Medicare Other | Admitting: Lab

## 2012-04-18 ENCOUNTER — Ambulatory Visit: Payer: Medicare Other

## 2012-04-18 DIAGNOSIS — D649 Anemia, unspecified: Secondary | ICD-10-CM

## 2012-04-18 DIAGNOSIS — K432 Incisional hernia without obstruction or gangrene: Secondary | ICD-10-CM

## 2012-04-18 DIAGNOSIS — J449 Chronic obstructive pulmonary disease, unspecified: Secondary | ICD-10-CM

## 2012-04-18 LAB — CBC WITH DIFFERENTIAL/PLATELET
Basophils Absolute: 0 10*3/uL (ref 0.0–0.1)
EOS%: 2.7 % (ref 0.0–7.0)
Eosinophils Absolute: 0.2 10*3/uL (ref 0.0–0.5)
HCT: 34.3 % — ABNORMAL LOW (ref 38.4–49.9)
HGB: 11.2 g/dL — ABNORMAL LOW (ref 13.0–17.1)
MONO#: 0.3 10*3/uL (ref 0.1–0.9)
NEUT#: 3.6 10*3/uL (ref 1.5–6.5)
NEUT%: 63 % (ref 39.0–75.0)
RDW: 14.4 % (ref 11.0–14.6)
WBC: 5.6 10*3/uL (ref 4.0–10.3)
lymph#: 1.6 10*3/uL (ref 0.9–3.3)

## 2012-04-18 MED ORDER — DARBEPOETIN ALFA-POLYSORBATE 300 MCG/0.6ML IJ SOLN: 300.0000 ug | Freq: Once | INTRAMUSCULAR | Status: DC

## 2012-05-08 DIAGNOSIS — R252 Cramp and spasm: Secondary | ICD-10-CM | POA: Diagnosis not present

## 2012-05-08 DIAGNOSIS — Z0000 Encounter for general adult medical examination without abnormal findings: Secondary | ICD-10-CM | POA: Diagnosis not present

## 2012-05-08 DIAGNOSIS — I872 Venous insufficiency (chronic) (peripheral): Secondary | ICD-10-CM | POA: Diagnosis not present

## 2012-05-08 DIAGNOSIS — R5381 Other malaise: Secondary | ICD-10-CM | POA: Diagnosis not present

## 2012-05-08 DIAGNOSIS — Z1331 Encounter for screening for depression: Secondary | ICD-10-CM | POA: Diagnosis not present

## 2012-05-12 ENCOUNTER — Telehealth: Payer: Self-pay | Admitting: Oncology

## 2012-05-12 NOTE — Telephone Encounter (Signed)
pt called to r/s lab and inj....Marland KitchenMarland KitchenDone

## 2012-05-16 ENCOUNTER — Ambulatory Visit: Payer: Medicare Other

## 2012-05-16 ENCOUNTER — Other Ambulatory Visit: Payer: Medicare Other | Admitting: Lab

## 2012-05-23 ENCOUNTER — Other Ambulatory Visit: Payer: Medicare Other | Admitting: Lab

## 2012-05-23 ENCOUNTER — Ambulatory Visit: Payer: Medicare Other

## 2012-05-24 ENCOUNTER — Telehealth: Payer: Self-pay | Admitting: *Deleted

## 2012-05-24 NOTE — Telephone Encounter (Signed)
Called patient at home about missed appointments.  Left message to call back to reschedule.

## 2012-06-12 DIAGNOSIS — I4891 Unspecified atrial fibrillation: Secondary | ICD-10-CM | POA: Diagnosis not present

## 2012-06-13 ENCOUNTER — Ambulatory Visit (HOSPITAL_BASED_OUTPATIENT_CLINIC_OR_DEPARTMENT_OTHER): Payer: Medicare Other

## 2012-06-13 ENCOUNTER — Telehealth: Payer: Self-pay | Admitting: Oncology

## 2012-06-13 ENCOUNTER — Other Ambulatory Visit (HOSPITAL_BASED_OUTPATIENT_CLINIC_OR_DEPARTMENT_OTHER): Payer: Medicare Other | Admitting: Lab

## 2012-06-13 VITALS — BP 141/64 | HR 85 | Temp 98.4°F

## 2012-06-13 DIAGNOSIS — N289 Disorder of kidney and ureter, unspecified: Secondary | ICD-10-CM

## 2012-06-13 DIAGNOSIS — D649 Anemia, unspecified: Secondary | ICD-10-CM

## 2012-06-13 DIAGNOSIS — K432 Incisional hernia without obstruction or gangrene: Secondary | ICD-10-CM

## 2012-06-13 DIAGNOSIS — J449 Chronic obstructive pulmonary disease, unspecified: Secondary | ICD-10-CM

## 2012-06-13 LAB — CBC WITH DIFFERENTIAL/PLATELET
Basophils Absolute: 0 10*3/uL (ref 0.0–0.1)
EOS%: 2.4 % (ref 0.0–7.0)
Eosinophils Absolute: 0.2 10*3/uL (ref 0.0–0.5)
HCT: 31.5 % — ABNORMAL LOW (ref 38.4–49.9)
HGB: 10.8 g/dL — ABNORMAL LOW (ref 13.0–17.1)
MCH: 32.5 pg (ref 27.2–33.4)
MCV: 94.7 fL (ref 79.3–98.0)
MONO%: 5.5 % (ref 0.0–14.0)
NEUT#: 4.2 10*3/uL (ref 1.5–6.5)
NEUT%: 66.9 % (ref 39.0–75.0)
Platelets: 126 10*3/uL — ABNORMAL LOW (ref 140–400)
RDW: 13.5 % (ref 11.0–14.6)

## 2012-06-13 MED ORDER — DARBEPOETIN ALFA-POLYSORBATE 300 MCG/0.6ML IJ SOLN
300.0000 ug | Freq: Once | INTRAMUSCULAR | Status: AC
  Administered 2012-06-13: 300 ug via SUBCUTANEOUS
  Filled 2012-06-13: qty 0.6

## 2012-06-13 NOTE — Telephone Encounter (Signed)
PT CAMED IN AND NEEDED TO R/S APPT...dONE

## 2012-06-15 DIAGNOSIS — I495 Sick sinus syndrome: Secondary | ICD-10-CM | POA: Diagnosis not present

## 2012-06-15 DIAGNOSIS — Z950 Presence of cardiac pacemaker: Secondary | ICD-10-CM | POA: Diagnosis not present

## 2012-06-16 DIAGNOSIS — G4730 Sleep apnea, unspecified: Secondary | ICD-10-CM | POA: Diagnosis not present

## 2012-07-03 DIAGNOSIS — G894 Chronic pain syndrome: Secondary | ICD-10-CM | POA: Diagnosis not present

## 2012-07-03 DIAGNOSIS — M47817 Spondylosis without myelopathy or radiculopathy, lumbosacral region: Secondary | ICD-10-CM | POA: Diagnosis not present

## 2012-07-03 DIAGNOSIS — M1710 Unilateral primary osteoarthritis, unspecified knee: Secondary | ICD-10-CM | POA: Diagnosis not present

## 2012-07-03 DIAGNOSIS — G609 Hereditary and idiopathic neuropathy, unspecified: Secondary | ICD-10-CM | POA: Diagnosis not present

## 2012-07-07 ENCOUNTER — Telehealth: Payer: Self-pay | Admitting: Oncology

## 2012-07-11 ENCOUNTER — Ambulatory Visit: Payer: Medicare Other

## 2012-07-11 ENCOUNTER — Other Ambulatory Visit: Payer: Medicare Other | Admitting: Lab

## 2012-07-11 ENCOUNTER — Other Ambulatory Visit (HOSPITAL_BASED_OUTPATIENT_CLINIC_OR_DEPARTMENT_OTHER): Payer: Medicare Other | Admitting: Lab

## 2012-07-11 DIAGNOSIS — J449 Chronic obstructive pulmonary disease, unspecified: Secondary | ICD-10-CM

## 2012-07-11 DIAGNOSIS — D649 Anemia, unspecified: Secondary | ICD-10-CM | POA: Diagnosis not present

## 2012-07-11 DIAGNOSIS — K432 Incisional hernia without obstruction or gangrene: Secondary | ICD-10-CM

## 2012-07-11 LAB — CBC WITH DIFFERENTIAL/PLATELET
Basophils Absolute: 0 10*3/uL (ref 0.0–0.1)
EOS%: 2.4 % (ref 0.0–7.0)
Eosinophils Absolute: 0.1 10*3/uL (ref 0.0–0.5)
LYMPH%: 25.6 % (ref 14.0–49.0)
MCH: 32.3 pg (ref 27.2–33.4)
MCV: 96 fL (ref 79.3–98.0)
MONO%: 5.5 % (ref 0.0–14.0)
NEUT#: 3.9 10*3/uL (ref 1.5–6.5)
Platelets: 143 10*3/uL (ref 140–400)
RBC: 3.54 10*6/uL — ABNORMAL LOW (ref 4.20–5.82)

## 2012-07-11 MED ORDER — DARBEPOETIN ALFA-POLYSORBATE 300 MCG/0.6ML IJ SOLN: 300.0000 ug | Freq: Once | INTRAMUSCULAR | Status: DC

## 2012-08-08 ENCOUNTER — Ambulatory Visit (HOSPITAL_BASED_OUTPATIENT_CLINIC_OR_DEPARTMENT_OTHER): Payer: Medicare Other | Admitting: Oncology

## 2012-08-08 ENCOUNTER — Telehealth: Payer: Self-pay | Admitting: Oncology

## 2012-08-08 ENCOUNTER — Ambulatory Visit: Payer: Medicare Other

## 2012-08-08 ENCOUNTER — Other Ambulatory Visit (HOSPITAL_BASED_OUTPATIENT_CLINIC_OR_DEPARTMENT_OTHER): Payer: Medicare Other | Admitting: Lab

## 2012-08-08 ENCOUNTER — Encounter: Payer: Self-pay | Admitting: *Deleted

## 2012-08-08 VITALS — BP 138/68 | HR 84 | Temp 97.4°F | Resp 18 | Ht 70.0 in | Wt 188.4 lb

## 2012-08-08 DIAGNOSIS — C61 Malignant neoplasm of prostate: Secondary | ICD-10-CM | POA: Diagnosis not present

## 2012-08-08 DIAGNOSIS — D649 Anemia, unspecified: Secondary | ICD-10-CM

## 2012-08-08 LAB — CBC WITH DIFFERENTIAL/PLATELET
BASO%: 0.5 % (ref 0.0–2.0)
LYMPH%: 31.1 % (ref 14.0–49.0)
MCHC: 34.9 g/dL (ref 32.0–36.0)
MCV: 94.8 fL (ref 79.3–98.0)
MONO%: 7 % (ref 0.0–14.0)
Platelets: 114 10*3/uL — ABNORMAL LOW (ref 140–400)
RBC: 3.36 10*6/uL — ABNORMAL LOW (ref 4.20–5.82)
RDW: 13.5 % (ref 11.0–14.6)
WBC: 5 10*3/uL (ref 4.0–10.3)

## 2012-08-08 NOTE — Telephone Encounter (Signed)
gv and printed appt sched and avs for pt  °

## 2012-08-08 NOTE — Progress Notes (Signed)
Hematology and Oncology Follow Up Visit  [name] [name] [id number] Jan 09, 1925 79 y.o. 08/08/2012 10:55 AM  CC: Hal T. Pete Glatter, M.D.  [name] [name]. [name] [name], M.D.    Principle Diagnosis:  79 year old gentleman with the following diagnoses. 1. Multifactorial anemia.  He has anemia of chronic disease.  It may be anemia of renal insufficiency. 2. History of prostate cancer status post radiation therapy completed in 2008.  Current therapy: He is on Aranesp 300 mcg subcutaneously every 4 weeks to keep his hemoglobin above 11.  Interim History: [name] [name] presents today for a followup visit.  Pleasant gentleman with a few comorbid conditions that include hypertension as well as prostate cancer.  He also has anemia of chronic disease as outlined above.  He had been receiving Aranesp as mentioned to keep his hemoglobin above 11 and had been doing relatively well with it.  He had not had any complications associated with it.  No new complaints since the last visit. He has been needing it every other month or so. His performance status is stable and still able to work part time.   Medications: I have reviewed the patient's current medications. Current Outpatient Prescriptions  Medication Sig Dispense Refill  . ALPHA-LIPOIC ACID PO Take 400 mg by mouth as directed.      Marland Kitchen aspirin 81 MG tablet Take 81 mg by mouth daily.        Marland Kitchen CALCIUM PO Take 2 tablets by mouth daily.      . darbepoetin alfa-polysorbate (ARANESP, ALB FREE, SURECLICK) 500 MCG/ML injection Inject 500 mcg into the skin as needed.        . furosemide (LASIX) 40 MG tablet Take 80 mg by mouth daily.       Marland Kitchen HYDROcodone-acetaminophen (VICODIN) 5-500 MG per tablet Take 1 tablet by mouth every 6 (six) hours as needed.        Marland Kitchen lisinopril (PRINIVIL,ZESTRIL) 20 MG tablet Take 10 mg by mouth daily.      . Multiple Vitamins-Minerals (ICAPS PO) Take 2 capsules by mouth 2 (two) times daily.       . Multiple Vitamins-Minerals (MULTIVITAMIN WITH  MINERALS) tablet Take 1 tablet by mouth daily.        . Omega-3 Fatty Acids (FISH OIL) 1200 MG CAPS Take 1 capsule by mouth daily.        Marland Kitchen omeprazole (PRILOSEC) 20 MG capsule Take 20 mg by mouth daily.       . vitamin B-12 (CYANOCOBALAMIN) 500 MCG tablet Take 500 mcg by mouth as directed.       No current facility-administered medications for this visit.    Allergies: No Known Allergies  Past Medical History, Surgical history, Social history, and Family History were reviewed and updated.  Review of Systems: Constitutional:  Negative for fever, chills, night sweats, anorexia, weight loss, pain. Cardiovascular: no chest pain or dyspnea on exertion Respiratory: no cough, shortness of breath, or wheezing Neurological: no TIA or stroke symptoms Dermatological: negative ENT: negative Skin: Negative. Gastrointestinal: no abdominal pain, change in bowel habits, or black or bloody stools Genito-Urinary: negative Hematological and Lymphatic: negative Breast: negative Musculoskeletal: negative Remaining ROS negative.  Physical Exam: Blood pressure 138/68, pulse 84, temperature 97.4 F (36.3 C), temperature source Oral, resp. rate 18, height 5\' 10"  (1.778 m), weight 188 lb 6.4 oz (85.458 kg). ECOG: 1 General appearance: alert Head: Normocephalic, without obvious abnormality, atraumatic Neck: no adenopathy, no carotid bruit, no JVD, supple, symmetrical, trachea midline and thyroid not enlarged,  symmetric, no tenderness/mass/nodules Lymph nodes: Cervical, supraclavicular, and axillary nodes normal. Heart:regular rate and rhythm, S1, S2 normal, no murmur, click, rub or gallop Lung:chest clear, no wheezing, rales, normal symmetric air entry Abdomen: soft, non-tender, without masses or organomegaly EXT:no erythema, induration, or nodules   Lab Results: Lab Results  Component Value Date   WBC 5.0 08/08/2012   HGB 11.1* 08/08/2012   HCT 31.8* 08/08/2012   MCV 94.8 08/08/2012   PLT 114  verified both analyzers* 08/08/2012     Chemistry      Component Value Date/Time   NA 135 10/25/2011 0650   K 3.7 10/25/2011 0650   CL 103 10/25/2011 0650   CO2 25 10/25/2011 0650   BUN 44* 10/25/2011 0650   CREATININE 1.05 10/25/2011 0650      Component Value Date/Time   CALCIUM 8.6 10/25/2011 0650   ALKPHOS 348* 10/24/2011 0614   AST 65* 10/24/2011 0614   ALT 114* 10/24/2011 0614   BILITOT 2.2* 10/24/2011 0614       Impression and Plan:  79 year old gentleman with the following issues. 1. Multifactorial anemia.  There is an element of anemia of renal insufficiency.  Currently on Aranesp 300 mcg to keep his hemoglobin above 11.  He will not need an injection today but we will monitor him on every month basis and given Aransep for Hgb under 11.  2. Prostate cancer.  No relapse at this point. He sees Dr [name] [name] in November for routine follow-up. 3. Follow-up. Aranesp injection every 8 weeks. Visit in 6 months.  [name] [name] 6/24/201410:55 AM

## 2012-08-08 NOTE — Progress Notes (Signed)
No need for Aranesp injection today per Dr. Clelia Croft, HGB-11.1.

## 2012-08-21 DIAGNOSIS — M5137 Other intervertebral disc degeneration, lumbosacral region: Secondary | ICD-10-CM | POA: Diagnosis not present

## 2012-08-21 DIAGNOSIS — I10 Essential (primary) hypertension: Secondary | ICD-10-CM | POA: Diagnosis not present

## 2012-08-21 DIAGNOSIS — Z79899 Other long term (current) drug therapy: Secondary | ICD-10-CM | POA: Diagnosis not present

## 2012-08-21 DIAGNOSIS — K219 Gastro-esophageal reflux disease without esophagitis: Secondary | ICD-10-CM | POA: Diagnosis not present

## 2012-08-21 DIAGNOSIS — R0602 Shortness of breath: Secondary | ICD-10-CM | POA: Diagnosis not present

## 2012-09-02 ENCOUNTER — Emergency Department (HOSPITAL_COMMUNITY)
Admission: EM | Admit: 2012-09-02 | Discharge: 2012-09-02 | Disposition: A | Discharge status: Home / Self Care | Payer: Medicare Other | Attending: Emergency Medicine | Admitting: Emergency Medicine

## 2012-09-02 ENCOUNTER — Encounter (HOSPITAL_COMMUNITY): Payer: Self-pay | Admitting: *Deleted

## 2012-09-02 DIAGNOSIS — W278XXA Contact with other nonpowered hand tool, initial encounter: Secondary | ICD-10-CM | POA: Insufficient documentation

## 2012-09-02 DIAGNOSIS — G8929 Other chronic pain: Secondary | ICD-10-CM | POA: Diagnosis not present

## 2012-09-02 DIAGNOSIS — H9190 Unspecified hearing loss, unspecified ear: Secondary | ICD-10-CM | POA: Insufficient documentation

## 2012-09-02 DIAGNOSIS — Y93H2 Activity, gardening and landscaping: Secondary | ICD-10-CM | POA: Insufficient documentation

## 2012-09-02 DIAGNOSIS — S81009A Unspecified open wound, unspecified knee, initial encounter: Secondary | ICD-10-CM | POA: Insufficient documentation

## 2012-09-02 DIAGNOSIS — Z950 Presence of cardiac pacemaker: Secondary | ICD-10-CM | POA: Diagnosis not present

## 2012-09-02 DIAGNOSIS — Z79899 Other long term (current) drug therapy: Secondary | ICD-10-CM | POA: Diagnosis not present

## 2012-09-02 DIAGNOSIS — M129 Arthropathy, unspecified: Secondary | ICD-10-CM | POA: Insufficient documentation

## 2012-09-02 DIAGNOSIS — Z7982 Long term (current) use of aspirin: Secondary | ICD-10-CM | POA: Insufficient documentation

## 2012-09-02 DIAGNOSIS — S91009A Unspecified open wound, unspecified ankle, initial encounter: Secondary | ICD-10-CM | POA: Diagnosis not present

## 2012-09-02 DIAGNOSIS — Z87891 Personal history of nicotine dependence: Secondary | ICD-10-CM | POA: Diagnosis not present

## 2012-09-02 DIAGNOSIS — Z872 Personal history of diseases of the skin and subcutaneous tissue: Secondary | ICD-10-CM | POA: Insufficient documentation

## 2012-09-02 DIAGNOSIS — G4733 Obstructive sleep apnea (adult) (pediatric): Secondary | ICD-10-CM | POA: Insufficient documentation

## 2012-09-02 DIAGNOSIS — M545 Low back pain, unspecified: Secondary | ICD-10-CM | POA: Diagnosis not present

## 2012-09-02 DIAGNOSIS — Z8546 Personal history of malignant neoplasm of prostate: Secondary | ICD-10-CM | POA: Diagnosis not present

## 2012-09-02 DIAGNOSIS — Z8669 Personal history of other diseases of the nervous system and sense organs: Secondary | ICD-10-CM | POA: Diagnosis not present

## 2012-09-02 DIAGNOSIS — J449 Chronic obstructive pulmonary disease, unspecified: Secondary | ICD-10-CM | POA: Insufficient documentation

## 2012-09-02 DIAGNOSIS — IMO0002 Reserved for concepts with insufficient information to code with codable children: Secondary | ICD-10-CM

## 2012-09-02 DIAGNOSIS — J4489 Other specified chronic obstructive pulmonary disease: Secondary | ICD-10-CM | POA: Insufficient documentation

## 2012-09-02 DIAGNOSIS — Y9289 Other specified places as the place of occurrence of the external cause: Secondary | ICD-10-CM | POA: Insufficient documentation

## 2012-09-02 DIAGNOSIS — I10 Essential (primary) hypertension: Secondary | ICD-10-CM | POA: Diagnosis not present

## 2012-09-02 DIAGNOSIS — K219 Gastro-esophageal reflux disease without esophagitis: Secondary | ICD-10-CM | POA: Insufficient documentation

## 2012-09-02 MED ORDER — CEPHALEXIN 500 MG PO CAPS: 500.0000 mg | ORAL_CAPSULE | Freq: Four times a day (QID) | ORAL | Status: DC

## 2012-09-02 NOTE — ED Notes (Signed)
Pt states he doesn't know when he had his tetanus shot last.

## 2012-09-02 NOTE — ED Provider Notes (Signed)
History    This chart was scribed for non-physician practitioner Roxy Horseman, PA working with Doug Sou, MD by Quintella Reichert, ED Scribe. This patient was seen in room WTR6/WTR6 and the patient's care was started at 5:21 PM.   CSN: [id number]  Arrival date & time 09/02/12  1651    Chief Complaint  Patient presents with  . Extremity Laceration    left leg    The history is provided by the patient. No language interpreter was used.    HPI Comments: [name] Acklin. is a 79 y.o. male who presents to the Emergency Department with a chief complaint of a laceration to the left leg that he sustained several hours ago.  Pt reports that he was dragging a "very deteriorated landscape timber" across his lawn when it slipped out of his hand and cut the back of his left leg near his ankle.  Bleeding began immediately after the incident and presently is controlled.  Pt denies weakness, numbness or tingling to the area.  He denies pain or injury to any other area.  He denies CP or any other complaints.  He states that his tetanus vaccinations are most likely UTD because he has had multiple surgeries in the past several years but he is not certain.  Pt denies h/o DM.  He denies medication allergies.  PCP is Dr. Pete Glatter    Past Medical History  Diagnosis Date  . Hypertension   . Hearing loss   . Pacemaker   . Leg swelling 10/19/2011    LLE  . COPD (chronic obstructive pulmonary disease) 10/19/2011    "touch"  . Exertional dyspnea 10/19/2011  . OSA on CPAP 10/19/2011    "sometimes wear CPAP"  . Prostate cancer     S/P radiation tx ~ 2008  . History of blood transfusion     "think it was related to pacemaker placement"  . GERD (gastroesophageal reflux disease)   . Arthritis     "back"  . Chronic lower back pain     Past Surgical History  Procedure Laterality Date  . Hernia repair  ~ 2010;     lap; VHR  . Hernia repair  ~ 2011    lap vh complicated by recurrence  . Hernia  repair  09/2011    open; VHR  . Insert / replace / remove pacemaker  ~ 2009    initial placement  . Cataract extraction w/ intraocular lens  implant, bilateral    . Joint replacement    . Total knee arthroplasty  1990's    right  . Cholecystectomy  10/23/2011  . Cholecystectomy  10/23/2011    Procedure: CHOLECYSTECTOMY;  Surgeon: Emelia Loron, MD;  Location: [facility] OR;  Service: General;  Laterality: N/A;  with intraoperative cholangiogram    Family History  Problem Relation Age of Onset  . Heart disease Father   . Heart disease Mother   . Colon cancer Mother     History  Substance Use Topics  . Smoking status: Former Smoker -- 1.50 packs/day for 35 years    Types: Cigarettes    Quit date: 02/15/1973  . Smokeless tobacco: Never Used  . Alcohol Use: No     Review of Systems A complete 10 system review of systems was obtained and all systems are negative except as noted in the HPI and PMH.    Allergies  Review of patient's allergies indicates no known allergies.  Home Medications   Current Outpatient Rx  Name  Route  Sig  Dispense  Refill  . ALPHA-LIPOIC ACID PO   Oral   Take 400 mg by mouth daily.          Marland Kitchen aspirin EC 81 MG tablet   Oral   Take 81 mg by mouth daily.         . calcium-vitamin D (OSCAL WITH D) 500-200 MG-UNIT per tablet   Oral   Take 1 tablet by mouth.         . darbepoetin alfa-polysorbate (ARANESP, ALB FREE, SURECLICK) 500 MCG/ML injection   Subcutaneous   Inject 500 mcg into the skin as needed.           . furosemide (LASIX) 40 MG tablet   Oral   Take 80 mg by mouth daily.          Marland Kitchen HYDROcodone-acetaminophen (NORCO) 7.5-325 MG per tablet   Oral   Take 1 tablet by mouth every 6 (six) hours as needed for pain.         Marland Kitchen lisinopril (PRINIVIL,ZESTRIL) 20 MG tablet   Oral   Take 10 mg by mouth daily.         . Multiple Vitamins-Minerals (ICAPS PO)   Oral   Take 2 capsules by mouth daily.          . Multiple  Vitamins-Minerals (MULTIVITAMIN WITH MINERALS) tablet   Oral   Take 1 tablet by mouth daily.           . Omega-3 Fatty Acids (FISH OIL) 1200 MG CAPS   Oral   Take 1 capsule by mouth daily.           Marland Kitchen omeprazole (PRILOSEC) 20 MG capsule   Oral   Take 20 mg by mouth daily.          . vitamin B-12 (CYANOCOBALAMIN) 500 MCG tablet   Oral   Take 500 mcg by mouth daily.           BP 120/75  Pulse 73  Temp(Src) 98.7 F (37.1 C) (Oral)  Resp 20  SpO2 99%  Physical Exam  Nursing note and vitals reviewed. Constitutional: He is oriented to person, place, and time. He appears well-developed and well-nourished. No distress.  HENT:  Head: Normocephalic and atraumatic.  Eyes: EOM are normal.  Neck: Neck supple. No tracheal deviation present.  Cardiovascular: Normal rate.   Pulmonary/Chest: Effort normal. No respiratory distress.  Musculoskeletal: Normal range of motion.  Neurological: He is alert and oriented to person, place, and time.  Skin: Skin is warm and dry.     3-cm laceration of posterior left leg near the ankle.  Psychiatric: He has a normal mood and affect. His behavior is normal.    ED Course  Procedures (including critical care time)  DIAGNOSTIC STUDIES: Oxygen Saturation is 99% on room air, normal by my interpretation.    COORDINATION OF CARE: 5:24 PM-Discussed treatment plan which includes laceration repair and f/u with PCP to have sutures removed with pt at bedside and pt agreed to plan.   LACERATION REPAIR PROCEDURE NOTE The patient's identification was confirmed and consent was obtained. This procedure was performed by Roxy Horseman, PA at 5:55 PM.  Site: Posterior left leg near ankle Sterile procedures observed Anesthetic used (type and amt): 4 mL Lidocaine 2% without epinephrine Suture type/size:4-0 ethilon Length:4 cm # of Sutures: 7 Technique: Interrupted Complexity: Simple Antibx ointment applied Tetanus UTD Site anesthetized,  irrigated with NS, explored without evidence of foreign body, wound  well approximated, site covered with dry, sterile dressing.  Patient tolerated procedure well without complications. Instructions for care discussed verbally and patient provided with additional written instructions for homecare and f/u.   Labs Reviewed - No data to display  No results found.  1. Laceration     MDM  Simple laceration.  Tetanus up to date.    I personally performed the services described in this documentation, which was scribed in my presence. The recorded information has been reviewed and is accurate.     Roxy Horseman, PA-C 09/02/12 2207

## 2012-09-02 NOTE — ED Notes (Signed)
Wound cleaned. Dressed with bacitracin and band-aid.

## 2012-09-02 NOTE — ED Notes (Signed)
Pt states he was moving some landscape timber earlier this afternoon when he accidentally scraped his leg against the timber.  Pt has @ 2 inch vertical lac to left lateral heel.  Pt denies pain.

## 2012-09-02 NOTE — ED Provider Notes (Signed)
Medical screening examination/treatment/procedure(s) were performed by non-physician practitioner and as supervising physician I was immediately available for consultation/collaboration.  Doug Sou, MD 09/02/12 [phone]

## 2012-09-05 ENCOUNTER — Ambulatory Visit (HOSPITAL_BASED_OUTPATIENT_CLINIC_OR_DEPARTMENT_OTHER): Payer: Medicare Other

## 2012-09-05 ENCOUNTER — Other Ambulatory Visit (HOSPITAL_BASED_OUTPATIENT_CLINIC_OR_DEPARTMENT_OTHER): Payer: Medicare Other | Admitting: Lab

## 2012-09-05 VITALS — BP 135/66 | HR 83 | Temp 98.0°F

## 2012-09-05 DIAGNOSIS — D649 Anemia, unspecified: Secondary | ICD-10-CM

## 2012-09-05 DIAGNOSIS — K432 Incisional hernia without obstruction or gangrene: Secondary | ICD-10-CM

## 2012-09-05 DIAGNOSIS — N289 Disorder of kidney and ureter, unspecified: Secondary | ICD-10-CM | POA: Diagnosis not present

## 2012-09-05 DIAGNOSIS — J449 Chronic obstructive pulmonary disease, unspecified: Secondary | ICD-10-CM

## 2012-09-05 LAB — CBC WITH DIFFERENTIAL/PLATELET
BASO%: 0.2 % (ref 0.0–2.0)
EOS%: 2.7 % (ref 0.0–7.0)
HCT: 32.8 % — ABNORMAL LOW (ref 38.4–49.9)
LYMPH%: 20.4 % (ref 14.0–49.0)
MCH: 32 pg (ref 27.2–33.4)
MCHC: 32.9 g/dL (ref 32.0–36.0)
MCV: 97 fL (ref 79.3–98.0)
MONO#: 0.3 10*3/uL (ref 0.1–0.9)
MONO%: 5.3 % (ref 0.0–14.0)
NEUT%: 71.4 % (ref 39.0–75.0)
Platelets: 129 10*3/uL — ABNORMAL LOW (ref 140–400)
RBC: 3.38 10*6/uL — ABNORMAL LOW (ref 4.20–5.82)

## 2012-09-05 MED ORDER — DARBEPOETIN ALFA-POLYSORBATE 300 MCG/0.6ML IJ SOLN
300.0000 ug | Freq: Once | INTRAMUSCULAR | Status: AC
  Administered 2012-09-05: 300 ug via SUBCUTANEOUS
  Filled 2012-09-05: qty 0.6

## 2012-09-07 DIAGNOSIS — M779 Enthesopathy, unspecified: Secondary | ICD-10-CM | POA: Diagnosis not present

## 2012-09-18 DIAGNOSIS — R609 Edema, unspecified: Secondary | ICD-10-CM | POA: Diagnosis not present

## 2012-09-18 DIAGNOSIS — Z4802 Encounter for removal of sutures: Secondary | ICD-10-CM | POA: Diagnosis not present

## 2012-09-18 DIAGNOSIS — R0602 Shortness of breath: Secondary | ICD-10-CM | POA: Diagnosis not present

## 2012-09-18 DIAGNOSIS — I4891 Unspecified atrial fibrillation: Secondary | ICD-10-CM | POA: Diagnosis not present

## 2012-09-18 DIAGNOSIS — Z950 Presence of cardiac pacemaker: Secondary | ICD-10-CM | POA: Diagnosis not present

## 2012-09-18 DIAGNOSIS — I10 Essential (primary) hypertension: Secondary | ICD-10-CM | POA: Diagnosis not present

## 2012-10-02 DIAGNOSIS — L089 Local infection of the skin and subcutaneous tissue, unspecified: Secondary | ICD-10-CM | POA: Diagnosis not present

## 2012-10-05 ENCOUNTER — Encounter (HOSPITAL_COMMUNITY): Payer: Self-pay | Admitting: Emergency Medicine

## 2012-10-05 ENCOUNTER — Emergency Department (HOSPITAL_COMMUNITY): Payer: Medicare Other

## 2012-10-05 ENCOUNTER — Emergency Department (HOSPITAL_COMMUNITY)
Admission: EM | Admit: 2012-10-05 | Discharge: 2012-10-05 | Disposition: A | Discharge status: Home / Self Care | Payer: Medicare Other | Attending: Emergency Medicine | Admitting: Emergency Medicine

## 2012-10-05 DIAGNOSIS — M545 Low back pain, unspecified: Secondary | ICD-10-CM | POA: Diagnosis not present

## 2012-10-05 DIAGNOSIS — S81009A Unspecified open wound, unspecified knee, initial encounter: Secondary | ICD-10-CM | POA: Insufficient documentation

## 2012-10-05 DIAGNOSIS — Z23 Encounter for immunization: Secondary | ICD-10-CM | POA: Insufficient documentation

## 2012-10-05 DIAGNOSIS — Y93H2 Activity, gardening and landscaping: Secondary | ICD-10-CM | POA: Insufficient documentation

## 2012-10-05 DIAGNOSIS — G4733 Obstructive sleep apnea (adult) (pediatric): Secondary | ICD-10-CM | POA: Diagnosis not present

## 2012-10-05 DIAGNOSIS — Y9289 Other specified places as the place of occurrence of the external cause: Secondary | ICD-10-CM | POA: Insufficient documentation

## 2012-10-05 DIAGNOSIS — Z8709 Personal history of other diseases of the respiratory system: Secondary | ICD-10-CM | POA: Insufficient documentation

## 2012-10-05 DIAGNOSIS — Z950 Presence of cardiac pacemaker: Secondary | ICD-10-CM | POA: Insufficient documentation

## 2012-10-05 DIAGNOSIS — G8929 Other chronic pain: Secondary | ICD-10-CM | POA: Insufficient documentation

## 2012-10-05 DIAGNOSIS — J4489 Other specified chronic obstructive pulmonary disease: Secondary | ICD-10-CM | POA: Insufficient documentation

## 2012-10-05 DIAGNOSIS — Z9981 Dependence on supplemental oxygen: Secondary | ICD-10-CM | POA: Insufficient documentation

## 2012-10-05 DIAGNOSIS — Z8546 Personal history of malignant neoplasm of prostate: Secondary | ICD-10-CM | POA: Insufficient documentation

## 2012-10-05 DIAGNOSIS — Z87891 Personal history of nicotine dependence: Secondary | ICD-10-CM | POA: Insufficient documentation

## 2012-10-05 DIAGNOSIS — M129 Arthropathy, unspecified: Secondary | ICD-10-CM | POA: Insufficient documentation

## 2012-10-05 DIAGNOSIS — IMO0002 Reserved for concepts with insufficient information to code with codable children: Secondary | ICD-10-CM

## 2012-10-05 DIAGNOSIS — Z8739 Personal history of other diseases of the musculoskeletal system and connective tissue: Secondary | ICD-10-CM | POA: Insufficient documentation

## 2012-10-05 DIAGNOSIS — S51009A Unspecified open wound of unspecified elbow, initial encounter: Secondary | ICD-10-CM | POA: Diagnosis not present

## 2012-10-05 DIAGNOSIS — I10 Essential (primary) hypertension: Secondary | ICD-10-CM | POA: Insufficient documentation

## 2012-10-05 DIAGNOSIS — H9190 Unspecified hearing loss, unspecified ear: Secondary | ICD-10-CM | POA: Diagnosis not present

## 2012-10-05 DIAGNOSIS — Z7982 Long term (current) use of aspirin: Secondary | ICD-10-CM | POA: Diagnosis not present

## 2012-10-05 DIAGNOSIS — Z79899 Other long term (current) drug therapy: Secondary | ICD-10-CM | POA: Insufficient documentation

## 2012-10-05 DIAGNOSIS — K219 Gastro-esophageal reflux disease without esophagitis: Secondary | ICD-10-CM | POA: Insufficient documentation

## 2012-10-05 DIAGNOSIS — Z923 Personal history of irradiation: Secondary | ICD-10-CM | POA: Diagnosis not present

## 2012-10-05 DIAGNOSIS — W19XXXA Unspecified fall, initial encounter: Secondary | ICD-10-CM

## 2012-10-05 DIAGNOSIS — W1789XA Other fall from one level to another, initial encounter: Secondary | ICD-10-CM | POA: Insufficient documentation

## 2012-10-05 DIAGNOSIS — J449 Chronic obstructive pulmonary disease, unspecified: Secondary | ICD-10-CM | POA: Insufficient documentation

## 2012-10-05 MED ORDER — TETANUS-DIPHTH-ACELL PERTUSSIS 5-2.5-18.5 LF-MCG/0.5 IM SUSP
0.5000 mL | Freq: Once | INTRAMUSCULAR | Status: AC
  Administered 2012-10-05: 0.5 mL via INTRAMUSCULAR
  Filled 2012-10-05: qty 0.5

## 2012-10-05 MED ORDER — BACITRACIN ZINC 500 UNIT/GM EX OINT
1.0000 "application " | TOPICAL_OINTMENT | Freq: Two times a day (BID) | CUTANEOUS | Status: DC
  Administered 2012-10-05: 1 via TOPICAL

## 2012-10-05 NOTE — ED Provider Notes (Signed)
CSN: [id number]     Arrival date & time 10/05/12  1816 History     First MD Initiated Contact with Patient 10/05/12 1829     Chief Complaint  Patient presents with  . Fall   (Consider location/radiation/quality/duration/timing/severity/associated sxs/prior Treatment) HPI Comments: Patient is an 79 year old male who presents today after falling off his riding lawnmower. He states he hit a tree and then has lawnmower began bucking. He did not hit his head, there is no loss of consciousness, no headache currently. He is not on blood thinners. He has a large laceration on his left lower leg as well as a skin tear on his right elbow. He reports that his last tetanus shot was in 2009. He reports a small amount of burning pain that is gradually increasing. No meds PTA.   The history is provided by the patient. No language interpreter was used.    Past Medical History  Diagnosis Date  . Hypertension   . Hearing loss   . Pacemaker   . Leg swelling 10/19/2011    LLE  . COPD (chronic obstructive pulmonary disease) 10/19/2011    "touch"  . Exertional dyspnea 10/19/2011  . OSA on CPAP 10/19/2011    "sometimes wear CPAP"  . Prostate cancer     S/P radiation tx ~ 2008  . History of blood transfusion     "think it was related to pacemaker placement"  . GERD (gastroesophageal reflux disease)   . Arthritis     "back"  . Chronic lower back pain    Past Surgical History  Procedure Laterality Date  . Hernia repair  ~ 2010;     lap; VHR  . Hernia repair  ~ 2011    lap vh complicated by recurrence  . Hernia repair  09/2011    open; VHR  . Insert / replace / remove pacemaker  ~ 2009    initial placement  . Cataract extraction w/ intraocular lens  implant, bilateral    . Joint replacement    . Total knee arthroplasty  1990's    right  . Cholecystectomy  10/23/2011  . Cholecystectomy  10/23/2011    Procedure: CHOLECYSTECTOMY;  Surgeon: Emelia Loron, MD;  Location: [facility] OR;  Service: General;   Laterality: N/A;  with intraoperative cholangiogram   Family History  Problem Relation Age of Onset  . Heart disease Father   . Heart disease Mother   . Colon cancer Mother    History  Substance Use Topics  . Smoking status: Former Smoker -- 1.50 packs/day for 35 years    Types: Cigarettes    Quit date: 02/15/1973  . Smokeless tobacco: Never Used  . Alcohol Use: No    Review of Systems  Respiratory: Negative for shortness of breath.   Cardiovascular: Negative for chest pain.  Gastrointestinal: Negative for nausea, vomiting and abdominal pain.  Skin: Positive for wound.  Neurological: Negative for weakness, numbness and headaches.  All other systems reviewed and are negative.    Allergies  Review of patient's allergies indicates no known allergies.  Home Medications   Current Outpatient Rx  Name  Route  Sig  Dispense  Refill  . ALPHA-LIPOIC ACID PO   Oral   Take 400 mg by mouth daily.          Marland Kitchen aspirin EC 81 MG tablet   Oral   Take 81 mg by mouth daily.         . calcium-vitamin D (OSCAL WITH D) 500-200  MG-UNIT per tablet   Oral   Take 1 tablet by mouth.         . cephALEXin (KEFLEX) 500 MG capsule   Oral   Take 1 capsule (500 mg total) by mouth 4 (four) times daily.   40 capsule   0   . darbepoetin alfa-polysorbate (ARANESP, ALB FREE, SURECLICK) 500 MCG/ML injection   Subcutaneous   Inject 500 mcg into the skin as needed.           . furosemide (LASIX) 40 MG tablet   Oral   Take 80 mg by mouth daily.          Marland Kitchen HYDROcodone-acetaminophen (NORCO) 7.5-325 MG per tablet   Oral   Take 1 tablet by mouth every 6 (six) hours as needed for pain.         Marland Kitchen lisinopril (PRINIVIL,ZESTRIL) 20 MG tablet   Oral   Take 10 mg by mouth daily.         . Multiple Vitamins-Minerals (ICAPS PO)   Oral   Take 2 capsules by mouth daily.          . Multiple Vitamins-Minerals (MULTIVITAMIN WITH MINERALS) tablet   Oral   Take 1 tablet by mouth daily.            . Omega-3 Fatty Acids (FISH OIL) 1200 MG CAPS   Oral   Take 1 capsule by mouth daily.           Marland Kitchen omeprazole (PRILOSEC) 20 MG capsule   Oral   Take 20 mg by mouth daily.          . vitamin B-12 (CYANOCOBALAMIN) 500 MCG tablet   Oral   Take 500 mcg by mouth daily.           BP 125/61  Pulse 68  Temp(Src) 98.9 F (37.2 C) (Oral)  Resp 20  SpO2 95% Physical Exam  Nursing note and vitals reviewed. Constitutional: He is oriented to person, place, and time. He appears well-developed and well-nourished. No distress.  HENT:  Head: Normocephalic and atraumatic.  Right Ear: External ear normal.  Left Ear: External ear normal.  Nose: Nose normal.  Eyes: Conjunctivae and EOM are normal. Pupils are equal, round, and reactive to light.  Neck: Normal range of motion. No tracheal deviation present.  Cardiovascular: Normal rate, regular rhythm and normal heart sounds.   Pulmonary/Chest: Effort normal and breath sounds normal. No stridor.  Abdominal: Soft. He exhibits no distension. There is no tenderness.  Musculoskeletal: Normal range of motion.  Strength tested against resistance. 5 out of 5 in the affected leg and foot  Neurological: He is alert and oriented to person, place, and time. He has normal strength. No sensory deficit. Coordination normal.  Skin: Skin is warm and dry. He is not diaphoretic.  10 cm laceration on left lower leg. No tendon involvement.  5 cm skin tear just superior to right elbow. 5 cm skin tear just inferior to right elbow.   Psychiatric: He has a normal mood and affect. His behavior is normal.    ED Course   Procedures (including critical care time)  LACERATION REPAIR Performed by: Junious Silk Authorized by: Junious Silk Consent: Verbal consent obtained. Risks and benefits: risks, benefits and alternatives were discussed Consent given by: patient Patient identity confirmed: provided demographic data Prepped and Draped in normal  sterile fashion Wound explored  Laceration Location: left lower leg  Laceration Length: 10 cm  No Foreign Bodies seen or  palpated  Anesthesia: local infiltration  Local anesthetic: lidocaine 2%  Anesthetic total: 10 ml  Irrigation method: syringe Amount of cleaning: standard  Skin closure: 4-0 Ethilon  Number of sutures: 8  Technique: simple interrupted   Patient tolerance: Patient tolerated the procedure well with no immediate complications.  LACERATION REPAIR Performed by: Junious Silk Authorized by: Junious Silk Consent: Verbal consent obtained. Risks and benefits: risks, benefits and alternatives were discussed Consent given by: patient Patient identity confirmed: provided demographic data Prepped and Draped in normal sterile fashion Wound explored  Laceration Location: right elbow  Laceration Length: 5 cm  No Foreign Bodies seen or palpated  Anesthesia: local infiltration  Local anesthetic: lidocaine 2%   Anesthetic total: 4 ml  Irrigation method: syringe Amount of cleaning: standard  Skin closure: 4-0 Ethilon  Number of sutures: 5  Technique: simple interrupted   Patient tolerance: Patient tolerated the procedure well with no immediate complications.   Labs Reviewed - No data to display Dg Tibia/fibula Left  10/05/2012   *RADIOLOGY REPORT*  Clinical Data: Left lower leg laceration.  LEFT TIBIA AND FIBULA - 2 VIEW  Comparison: None.  Findings: Skin defect along the medial aspect of the left lower leg consistent with laceration is identified.  No radiopaque foreign body or fracture is identified.  IMPRESSION: Laceration without underlying foreign body or fracture.   Original Report Authenticated By: Holley Dexter, M.D.   1. Laceration   2. Fall, initial encounter     MDM  Patient not on blood thinners, no LOC, no headache, he did not hit is head. No concern for intracranial trauma or pathology. Tdap booster given. Wound cleaning  complete with pressure irrigation, bottom of wound visualized, no foreign bodies appreciated. Laceration occurred < 8 hours prior to repair which was well tolerated. Pt has no co morbidities to effect normal wound healing. Discussed suture home care w pt and answered questions. Pt to f-u for wound check and suture removal in 10 days. Pt is hemodynamically stable w no complaints prior to dc. Dr. Elesa Massed evaluated patient and agrees with plan.     Mora Bellman, PA-C 10/06/12 1234

## 2012-10-05 NOTE — ED Notes (Signed)
Pt presenting to ed with c/o lawn mower getting out of control and he jumped off of it pt with skin avulsion noted to right elbow and left lower extremity

## 2012-10-05 NOTE — ED Notes (Signed)
Patient transported to X-ray 

## 2012-10-05 NOTE — ED Provider Notes (Signed)
Medical screening examination/treatment/procedure(s) were conducted as a shared visit with non-physician practitioner(s) and myself.  I personally evaluated the patient during the encounter and agree with physical exam and plan of care.  Patient is an 72 rhythm with a history of hypertension, COPD, pacemaker, prostate cancer who presents the emergency department after he fell off his lawnmower today. He reports that he hit a tree and was knocked off onto the ground. He did not his head. He has a large laceration to his left lower extremity and skin tear to his right elbow. X-rays have been unremarkable for foreign body. Updated tetanus. Will clean wound extensively, repair. Anticipate discharge home.  Layla Maw Joanne Brander, DO 10/05/12 2341

## 2012-10-06 DIAGNOSIS — M159 Polyosteoarthritis, unspecified: Secondary | ICD-10-CM | POA: Diagnosis not present

## 2012-10-06 DIAGNOSIS — M4120 Other idiopathic scoliosis, site unspecified: Secondary | ICD-10-CM | POA: Diagnosis not present

## 2012-10-06 DIAGNOSIS — M47817 Spondylosis without myelopathy or radiculopathy, lumbosacral region: Secondary | ICD-10-CM | POA: Diagnosis not present

## 2012-10-06 DIAGNOSIS — G894 Chronic pain syndrome: Secondary | ICD-10-CM | POA: Diagnosis not present

## 2012-10-11 DIAGNOSIS — T148XXA Other injury of unspecified body region, initial encounter: Secondary | ICD-10-CM | POA: Diagnosis not present

## 2012-10-11 DIAGNOSIS — L089 Local infection of the skin and subcutaneous tissue, unspecified: Secondary | ICD-10-CM | POA: Diagnosis not present

## 2012-10-12 NOTE — ED Provider Notes (Signed)
Medical screening examination/treatment/procedure(s) were performed by non-physician practitioner and as supervising physician I was immediately available for consultation/collaboration.  Kristen N Ward, DO 10/12/12 1056 

## 2012-10-17 ENCOUNTER — Inpatient Hospital Stay (HOSPITAL_COMMUNITY): Payer: Medicare Other

## 2012-10-17 ENCOUNTER — Inpatient Hospital Stay (HOSPITAL_COMMUNITY)
Admission: AD | Admit: 2012-10-17 | Discharge: 2012-10-24 | DRG: 574 | Disposition: A | Discharge status: Home Health | Payer: Medicare Other | Source: Ambulatory Visit | Attending: Internal Medicine | Admitting: Internal Medicine

## 2012-10-17 ENCOUNTER — Encounter (HOSPITAL_COMMUNITY): Payer: Self-pay

## 2012-10-17 DIAGNOSIS — L02419 Cutaneous abscess of limb, unspecified: Secondary | ICD-10-CM | POA: Diagnosis not present

## 2012-10-17 DIAGNOSIS — K219 Gastro-esophageal reflux disease without esophagitis: Secondary | ICD-10-CM | POA: Diagnosis present

## 2012-10-17 DIAGNOSIS — I4891 Unspecified atrial fibrillation: Secondary | ICD-10-CM | POA: Diagnosis not present

## 2012-10-17 DIAGNOSIS — Z96659 Presence of unspecified artificial knee joint: Secondary | ICD-10-CM | POA: Diagnosis not present

## 2012-10-17 DIAGNOSIS — J439 Emphysema, unspecified: Secondary | ICD-10-CM | POA: Diagnosis present

## 2012-10-17 DIAGNOSIS — W28XXXA Contact with powered lawn mower, initial encounter: Secondary | ICD-10-CM | POA: Diagnosis not present

## 2012-10-17 DIAGNOSIS — N179 Acute kidney failure, unspecified: Secondary | ICD-10-CM | POA: Diagnosis present

## 2012-10-17 DIAGNOSIS — I10 Essential (primary) hypertension: Secondary | ICD-10-CM | POA: Diagnosis not present

## 2012-10-17 DIAGNOSIS — G4733 Obstructive sleep apnea (adult) (pediatric): Secondary | ICD-10-CM | POA: Diagnosis present

## 2012-10-17 DIAGNOSIS — D638 Anemia in other chronic diseases classified elsewhere: Secondary | ICD-10-CM | POA: Diagnosis present

## 2012-10-17 DIAGNOSIS — I872 Venous insufficiency (chronic) (peripheral): Secondary | ICD-10-CM | POA: Diagnosis present

## 2012-10-17 DIAGNOSIS — J90 Pleural effusion, not elsewhere classified: Secondary | ICD-10-CM | POA: Diagnosis not present

## 2012-10-17 DIAGNOSIS — C61 Malignant neoplasm of prostate: Secondary | ICD-10-CM | POA: Diagnosis not present

## 2012-10-17 DIAGNOSIS — L0390 Cellulitis, unspecified: Secondary | ICD-10-CM | POA: Diagnosis present

## 2012-10-17 DIAGNOSIS — Z66 Do not resuscitate: Secondary | ICD-10-CM | POA: Diagnosis present

## 2012-10-17 DIAGNOSIS — Z950 Presence of cardiac pacemaker: Secondary | ICD-10-CM | POA: Diagnosis present

## 2012-10-17 DIAGNOSIS — L97909 Non-pressure chronic ulcer of unspecified part of unspecified lower leg with unspecified severity: Secondary | ICD-10-CM | POA: Diagnosis not present

## 2012-10-17 DIAGNOSIS — J4489 Other specified chronic obstructive pulmonary disease: Secondary | ICD-10-CM | POA: Diagnosis not present

## 2012-10-17 DIAGNOSIS — D62 Acute posthemorrhagic anemia: Secondary | ICD-10-CM | POA: Diagnosis present

## 2012-10-17 DIAGNOSIS — Z79899 Other long term (current) drug therapy: Secondary | ICD-10-CM | POA: Diagnosis not present

## 2012-10-17 DIAGNOSIS — D649 Anemia, unspecified: Secondary | ICD-10-CM

## 2012-10-17 DIAGNOSIS — Y93H2 Activity, gardening and landscaping: Secondary | ICD-10-CM | POA: Diagnosis not present

## 2012-10-17 DIAGNOSIS — J449 Chronic obstructive pulmonary disease, unspecified: Secondary | ICD-10-CM

## 2012-10-17 DIAGNOSIS — S81009A Unspecified open wound, unspecified knee, initial encounter: Secondary | ICD-10-CM | POA: Diagnosis not present

## 2012-10-17 DIAGNOSIS — L089 Local infection of the skin and subcutaneous tissue, unspecified: Secondary | ICD-10-CM

## 2012-10-17 DIAGNOSIS — J438 Other emphysema: Secondary | ICD-10-CM | POA: Diagnosis not present

## 2012-10-17 DIAGNOSIS — S8990XA Unspecified injury of unspecified lower leg, initial encounter: Secondary | ICD-10-CM | POA: Diagnosis not present

## 2012-10-17 DIAGNOSIS — L0291 Cutaneous abscess, unspecified: Secondary | ICD-10-CM

## 2012-10-17 LAB — CBC
MCH: 32.4 pg (ref 26.0–34.0)
MCHC: 33.1 g/dL (ref 30.0–36.0)
Platelets: 145 10*3/uL — ABNORMAL LOW (ref 150–400)

## 2012-10-17 LAB — COMPREHENSIVE METABOLIC PANEL
ALT: 11 U/L (ref 0–53)
AST: 21 U/L (ref 0–37)
Calcium: 9.5 mg/dL (ref 8.4–10.5)
Creatinine, Ser: 1.45 mg/dL — ABNORMAL HIGH (ref 0.50–1.35)
GFR calc Af Amer: 49 mL/min — ABNORMAL LOW (ref >90)
Glucose, Bld: 121 mg/dL — ABNORMAL HIGH (ref 70–99)
Sodium: 138 mEq/L (ref 135–145)
Total Protein: 6.5 g/dL (ref 6.0–8.3)

## 2012-10-17 MED ORDER — VANCOMYCIN HCL IN DEXTROSE 1-5 GM/200ML-% IV SOLN
1000.0000 mg | INTRAVENOUS | Status: DC
  Filled 2012-10-17: qty 200

## 2012-10-17 MED ORDER — PANTOPRAZOLE SODIUM 40 MG PO TBEC
40.0000 mg | DELAYED_RELEASE_TABLET | Freq: Every day | ORAL | Status: DC
  Administered 2012-10-18 – 2012-10-24 (×7): 40 mg via ORAL
  Filled 2012-10-17 (×7): qty 1

## 2012-10-17 MED ORDER — VANCOMYCIN HCL 10 G IV SOLR: 1500.0000 mg | INTRAVENOUS | Status: DC

## 2012-10-17 MED ORDER — ASPIRIN EC 81 MG PO TBEC
81.0000 mg | DELAYED_RELEASE_TABLET | Freq: Every day | ORAL | Status: DC
  Administered 2012-10-17 – 2012-10-24 (×8): 81 mg via ORAL
  Filled 2012-10-17 (×8): qty 1

## 2012-10-17 MED ORDER — ONDANSETRON HCL 4 MG/2ML IJ SOLN
4.0000 mg | Freq: Four times a day (QID) | INTRAMUSCULAR | Status: DC | PRN
Start: 2012-10-17 — End: 2012-10-24

## 2012-10-17 MED ORDER — ENOXAPARIN SODIUM 40 MG/0.4ML ~~LOC~~ SOLN
40.0000 mg | SUBCUTANEOUS | Status: DC
  Administered 2012-10-17 – 2012-10-23 (×7): 40 mg via SUBCUTANEOUS
  Filled 2012-10-17 (×8): qty 0.4

## 2012-10-17 MED ORDER — VANCOMYCIN HCL 10 G IV SOLR
1500.0000 mg | Freq: Once | INTRAVENOUS | Status: AC
  Administered 2012-10-17: 1500 mg via INTRAVENOUS
  Filled 2012-10-17: qty 1500

## 2012-10-17 MED ORDER — ENOXAPARIN SODIUM 30 MG/0.3ML ~~LOC~~ SOLN
30.0000 mg | SUBCUTANEOUS | Status: DC
  Filled 2012-10-17: qty 0.3

## 2012-10-17 MED ORDER — ACETAMINOPHEN 325 MG PO TABS
650.0000 mg | ORAL_TABLET | Freq: Four times a day (QID) | ORAL | Status: DC | PRN
  Administered 2012-10-18: 650 mg via ORAL
  Filled 2012-10-17: qty 2

## 2012-10-17 MED ORDER — ADULT MULTIVITAMIN W/MINERALS CH
1.0000 | ORAL_TABLET | Freq: Every day | ORAL | Status: DC
  Administered 2012-10-18 – 2012-10-24 (×6): 1 via ORAL
  Filled 2012-10-17 (×7): qty 1

## 2012-10-17 MED ORDER — TRAMADOL HCL 50 MG PO TABS
50.0000 mg | ORAL_TABLET | Freq: Four times a day (QID) | ORAL | Status: DC | PRN
  Administered 2012-10-17 – 2012-10-23 (×12): 50 mg via ORAL
  Filled 2012-10-17 (×12): qty 1

## 2012-10-17 MED ORDER — LISINOPRIL 10 MG PO TABS
10.0000 mg | ORAL_TABLET | Freq: Every day | ORAL | Status: DC
  Administered 2012-10-18 – 2012-10-24 (×5): 10 mg via ORAL
  Filled 2012-10-17 (×7): qty 1

## 2012-10-17 NOTE — Progress Notes (Addendum)
ANTIBIOTIC CONSULT NOTE - INITIAL  Pharmacy Consult for vancomycin Indication: cellulitis  No Known Allergies  Patient Measurements:   Adjusted Body Weight:   Vital Signs: Temp: 97.8 F (36.6 C) (09/02 1430) Temp src: Oral (09/02 1430) BP: 134/75 mmHg (09/02 1430) Pulse Rate: 73 (09/02 1430) Intake/Output from previous day:   Intake/Output from this shift:    Labs: No results found for this basename: WBC, HGB, PLT, LABCREA, CREATININE,  in the last 72 hours The CrCl is unknown because both a height and weight (above a minimum accepted value) are required for this calculation. No results found for this basename: VANCOTROUGH, VANCOPEAK, VANCORANDOM, GENTTROUGH, GENTPEAK, GENTRANDOM, TOBRATROUGH, TOBRAPEAK, TOBRARND, AMIKACINPEAK, AMIKACINTROU, AMIKACIN,  in the last 72 hours   Microbiology: No results found for this or any previous visit (from the past 720 hour(s)).  Medical History: Past Medical History  Diagnosis Date  . Hypertension   . Hearing loss   . Pacemaker   . Leg swelling 10/19/2011    LLE  . COPD (chronic obstructive pulmonary disease) 10/19/2011    "touch"  . Exertional dyspnea 10/19/2011  . OSA on CPAP 10/19/2011    "sometimes wear CPAP"  . Prostate cancer     S/P radiation tx ~ 2008  . History of blood transfusion     "think it was related to pacemaker placement"  . GERD (gastroesophageal reflux disease)   . Arthritis     "back"  . Chronic lower back pain    Assessment: 34 YOM presents with skin infection of LLE, on 8/21 he fell off his riding lawnmower and sustained a large laceration to his LLE requiring stitches.  He was placed on Amoxicillin/clavulonate and presented to PCP for follow-up and found to have worsening wound, orders to start IV vancomycin with ortho to evaluate  9/2 >> vancomycin >>  Tmax: unavailable WBCs = 7 Renal: SCr = 1.45  NO cultures at this time  Dose changes/drug level info:   Goal of Therapy:  Vancomycin trough level  10-15 mcg/ml  Plan:   Vancomycin 1500mg  IV x 1 then 1gm IV q24h based on current renal fx  Check vancomycin trough after 4-5 days of therapy, sooner if change in renal function or clinical status related to infection .   Juliette Alcide, PharmD, BCPS.   Pager: [phone]  10/17/2012,3:29 PM

## 2012-10-17 NOTE — H&P (Addendum)
[facility] History and Physical  WILKINS ELPERS AVW:[id number] DOB: December 01, 1924 DOA: 10/17/2012  Referring physician:Dr.Stoneking PCP: Ginette Otto, MD   Chief Complaint: left leg wound  HPI: [name] COUDRIET is a 79 y.o. male with h/o prostate CA, HTN, COPD,  fell off his riding lawnmower on 8/21 and sustained a large laceration involving his lower leg, subsequently he came to the ER, this was sutured and sent home. He saw his PCP 1 week back and was started on Augmentin for wound infection and went for a recheck today and was sent to [facility] as a direct admit for IV Abx. He has noticed increase pain, surrounding redness and dark/black discoloration of the central part of the wound. No fevers or chills.  Review of Systems: The patient denies anorexia, fever, weight loss,, vision loss, decreased hearing, hoarseness, chest pain, syncope, dyspnea on exertion, peripheral edema, balance deficits, hemoptysis, abdominal pain, melena, hematochezia, severe indigestion/heartburn, hematuria, incontinence, genital sores, muscle weakness, suspicious skin lesions, transient blindness, difficulty walking, depression, unusual weight change, abnormal bleeding, enlarged lymph nodes, angioedema, and breast masses.    Past Medical History  Diagnosis Date  . Hypertension   . Hearing loss   . Pacemaker   . Leg swelling 10/19/2011    LLE  . COPD (chronic obstructive pulmonary disease) 10/19/2011    "touch"  . Exertional dyspnea 10/19/2011  . OSA on CPAP 10/19/2011    "sometimes wear CPAP"  . Prostate cancer     S/P radiation tx ~ 2008  . History of blood transfusion     "think it was related to pacemaker placement"  . GERD (gastroesophageal reflux disease)   . Arthritis     "back"  . Chronic lower back pain    Past Surgical History  Procedure Laterality Date  . Hernia repair  ~ 2010;     lap; VHR  . Hernia repair  ~ 2011    lap vh complicated by recurrence  . Hernia repair  09/2011     open; VHR  . Insert / replace / remove pacemaker  ~ 2009    initial placement  . Cataract extraction w/ intraocular lens  implant, bilateral    . Joint replacement    . Total knee arthroplasty  1990's    right  . Cholecystectomy  10/23/2011  . Cholecystectomy  10/23/2011    Procedure: CHOLECYSTECTOMY;  Surgeon: Emelia Loron, MD;  Location: [facility] OR;  Service: General;  Laterality: N/A;  with intraoperative cholangiogram   Social History:  reports that he quit smoking about 39 years ago. His smoking use included Cigarettes. He has a 52.5 pack-year smoking history. He has never used smokeless tobacco. He reports that he does not drink alcohol or use illicit drugs. Lives at home with spouse  No Known Allergies  Family History  Problem Relation Age of Onset  . Heart disease Father   . Heart disease Mother   . Colon cancer Mother     Prior to Admission medications   Medication Sig Start Date End Date Taking? Authorizing Provider  ALPHA-LIPOIC ACID PO Take 400 mg by mouth daily.    Yes Historical Provider, MD  aspirin EC 81 MG tablet Take 81 mg by mouth daily.   Yes Historical Provider, MD  calcium-vitamin D (OSCAL WITH D) 500-200 MG-UNIT per tablet Take 1 tablet by mouth.   Yes Historical Provider, MD  darbepoetin alfa-polysorbate (ARANESP, ALB FREE, SURECLICK) 500 MCG/ML injection Inject 500 mcg into the skin as needed. Every  2 months   Yes Historical Provider, MD  furosemide (LASIX) 40 MG tablet Take 80 mg by mouth daily.    Yes Historical Provider, MD  lisinopril (PRINIVIL,ZESTRIL) 20 MG tablet Take 10 mg by mouth daily.   Yes Historical Provider, MD  Multiple Vitamins-Minerals (ICAPS PO) Take 2 capsules by mouth daily.    Yes Historical Provider, MD  Multiple Vitamins-Minerals (MULTIVITAMIN WITH MINERALS) tablet Take 1 tablet by mouth daily.     Yes Historical Provider, MD  Omega-3 Fatty Acids (FISH OIL) 1200 MG CAPS Take 1 capsule by mouth daily.     Yes Historical Provider, MD   omeprazole (PRILOSEC) 20 MG capsule Take 20 mg by mouth daily.    Yes Historical Provider, MD  traMADol (ULTRAM) 50 MG tablet Take 50 mg by mouth every 6 (six) hours as needed for pain.   Yes Historical Provider, MD  vitamin B-12 (CYANOCOBALAMIN) 500 MCG tablet Take 500 mcg by mouth daily.    Yes Historical Provider, MD   Physical Exam: Filed Vitals:   10/17/12 1430  BP: 134/75  Pulse: 73  Temp: 97.8 F (36.6 C)  Resp: 16     General:  AAOx3, no distress  HEENT: PERRLA, EOMI  CVS: S1S2/RRR  Lungs: CTAB  Abdomen: soft, NT, BS present  Skin: laceration and skin tear at R elbow  Musculoskeletal: LLE 2 plus edema  6x7cm black necrotic appearing wound with surrounding erythema tracking up the leg  Psychiatric: appropriate mood and affect  Neurologic: non focal  Labs on Admission:  Basic Metabolic Panel: No results found for this basename: NA, K, CL, CO2, GLUCOSE, BUN, CREATININE, CALCIUM, MG, PHOS,  in the last 168 hours Liver Function Tests: No results found for this basename: AST, ALT, ALKPHOS, BILITOT, PROT, ALBUMIN,  in the last 168 hours No results found for this basename: LIPASE, AMYLASE,  in the last 168 hours No results found for this basename: AMMONIA,  in the last 168 hours CBC: No results found for this basename: WBC, NEUTROABS, HGB, HCT, MCV, PLT,  in the last 168 hours Cardiac Enzymes: No results found for this basename: CKTOTAL, CKMB, CKMBINDEX, TROPONINI,  in the last 168 hours  BNP (last 3 results) No results found for this basename: PROBNP,  in the last 8760 hours CBG: No results found for this basename: GLUCAP,  in the last 168 hours  Radiological Exams on Admission: No results found.  Assessment/Plan Principal Problem:   Post-traumatic wound infection Active Problems:   Cellulitis   COPD (chronic obstructive pulmonary disease)   Pacemaker   Prostate cancer   Atrial fibrillation   1. L leg Wound infection/cellulitis with central wound  necrosis -admit to med-surg -Start Iv VAnc -Ortho consult , d/w Dr.Duda -check Xray tib-fib -stat CBC, CMET  2. COPD -stable  3. Chronic anemia -on SQ Aranesp per Dr.Shadad -await labs today  4. HTN -continue lisinopril  Code Status: DNR Family Communication: d/w wife at bedside Disposition Plan: admit  Time spent:57min  Zannie Cove [facility] Pager [phone]  If 7PM-7AM, please contact night-coverage [email or web address] Password [email or web address] 10/17/2012, 3:20 PM

## 2012-10-17 NOTE — Progress Notes (Signed)
PHARMACY - LOVENOX  Pharmacy to adjust Lovenox dose as needed for VTE prophylaxis.  BMI < 30 and CrCl > 30 ml/min  PLAN:  Change Lovenox to 40 mg sq q24h for VTE prophylaxis.  Terrilee Files, PharmD 10/17/12

## 2012-10-17 NOTE — Consult Note (Signed)
Reason for Consult: Necrotic wound left Referring Physician: Dr. San Morelle [name] [name] is an 79 y.o. male.  HPI: Patient is a 79 year old gentleman who states that he jumped off his lawn mowing tractor and sustained a avulsion laceration of the skin over the anterior medial aspect of his left leg. He went to the emergency room this was sutured and Steri-Stripped and presents at this time one week out from his injury with a large necrotic wound.  Past Medical History  Diagnosis Date  . Hypertension   . Hearing loss   . Pacemaker   . Leg swelling 10/19/2011    LLE  . COPD (chronic obstructive pulmonary disease) 10/19/2011    "touch"  . Exertional dyspnea 10/19/2011  . OSA on CPAP 10/19/2011    "sometimes wear CPAP"  . Prostate cancer     S/P radiation tx ~ 2008  . History of blood transfusion     "think it was related to pacemaker placement"  . GERD (gastroesophageal reflux disease)   . Arthritis     "back"  . Chronic lower back pain     Past Surgical History  Procedure Laterality Date  . Hernia repair  ~ 2010;     lap; VHR  . Hernia repair  ~ 2011    lap vh complicated by recurrence  . Hernia repair  09/2011    open; VHR  . Insert / replace / remove pacemaker  ~ 2009    initial placement  . Cataract extraction w/ intraocular lens  implant, bilateral    . Joint replacement    . Total knee arthroplasty  1990's    right  . Cholecystectomy  10/23/2011  . Cholecystectomy  10/23/2011    Procedure: CHOLECYSTECTOMY;  Surgeon: Emelia Loron, MD;  Location: [facility] OR;  Service: General;  Laterality: N/A;  with intraoperative cholangiogram    Family History  Problem Relation Age of Onset  . Heart disease Father   . Heart disease Mother   . Colon cancer Mother     Social History:  reports that he quit smoking about 39 years ago. His smoking use included Cigarettes. He has a 52.5 pack-year smoking history. He has never used smokeless tobacco. He reports that he does not drink alcohol  or use illicit drugs.  Allergies: No Known Allergies  Medications: I have reviewed the patient's current medications.  Results for orders placed during the hospital encounter of 10/17/12 (from the past 48 hour(s))  CBC     Status: Abnormal   Collection Time    10/17/12  3:55 PM      Result Value Range   WBC 7.0  4.0 - 10.5 K/uL   RBC 2.90 (*) 4.22 - 5.81 MIL/uL   Hemoglobin 9.4 (*) 13.0 - 17.0 g/dL   HCT 69.6 (*) 29.5 - 28.4 %   MCV 97.9  78.0 - 100.0 fL   MCH 32.4  26.0 - 34.0 pg   MCHC 33.1  30.0 - 36.0 g/dL   RDW 13.2  44.0 - 10.2 %   Platelets 145 (*) 150 - 400 K/uL  COMPREHENSIVE METABOLIC PANEL     Status: Abnormal   Collection Time    10/17/12  3:55 PM      Result Value Range   Sodium 138  135 - 145 mEq/L   Potassium 4.2  3.5 - 5.1 mEq/L   Chloride 100  96 - 112 mEq/L   CO2 30  19 - 32 mEq/L   Glucose, Bld  121 (*) 70 - 99 mg/dL   BUN 30 (*) 6 - 23 mg/dL   Creatinine, Ser 4.09 (*) 0.50 - 1.35 mg/dL   Calcium 9.5  8.4 - 81.1 mg/dL   Total Protein 6.5  6.0 - 8.3 g/dL   Albumin 3.2 (*) 3.5 - 5.2 g/dL   AST 21  0 - 37 U/L   ALT 11  0 - 53 U/L   Alkaline Phosphatase 83  39 - 117 U/L   Total Bilirubin 0.4  0.3 - 1.2 mg/dL   GFR calc non Af Amer 42 (*) >90 mL/min   GFR calc Af Amer 49 (*) >90 mL/min   Comment: (NOTE)     The eGFR has been calculated using the CKD EPI equation.     This calculation has not been validated in all clinical situations.     eGFR's persistently <90 mL/min signify possible Chronic Kidney     Disease.    Dg Tibia/fibula Left  10/17/2012   *RADIOLOGY REPORT*  Clinical Data: Left lower leg injury with cellulitis.  LEFT TIBIA AND FIBULA - 2 VIEW  Comparison: 10/05/2012  Findings: There is no evidence of fracture, bony destruction or soft tissue foreign body.  No bony lesions are seen.  IMPRESSION: No acute findings.  No evidence of fracture, osteomyelitis or soft tissue foreign body.   Original Report Authenticated By: Irish Lack, M.D.     Review of Systems  All other systems reviewed and are negative.   Blood pressure 134/75, pulse 73, temperature 97.8 F (36.6 C), temperature source Oral, resp. rate 16, height 5\' 9"  (1.753 m), weight 83.915 kg (185 lb), SpO2 99.00%. Physical Exam On examination patient has good dorsalis pedis pulse in the left foot. He has venous stasis efficiency in both legs with swelling pitting edema and brawny skin color changes. He has a chronic ulcer posterior lateral lead on the left calf from chronic venous stasis ulcer. Anterior medially he has an ulcer which is 7 cm in diameter approximately 1 cm deep with a large hematoma. Assessment/Plan: Assessment: Venous stasis insufficiency with large necrotic wound left leg with a large hematoma.  Plan: At bedside patient underwent excision with scissors and pickups of 50% of the necrotic tissue. This exposed a large hematoma. We will have the patient start with daily hydrotherapy  with IV antibiotics. Patient will require split thickness skin graft. I will have my partner to evaluate for possible split thickness skin graft. I am leaving out of town tomorrow morning.  DUDA,MARCUS V 10/17/2012, 6:39 PM

## 2012-10-17 NOTE — Progress Notes (Signed)
Utilization review completed.  

## 2012-10-18 DIAGNOSIS — D649 Anemia, unspecified: Secondary | ICD-10-CM | POA: Diagnosis not present

## 2012-10-18 DIAGNOSIS — I4891 Unspecified atrial fibrillation: Secondary | ICD-10-CM | POA: Diagnosis not present

## 2012-10-18 DIAGNOSIS — L0291 Cutaneous abscess, unspecified: Secondary | ICD-10-CM | POA: Diagnosis not present

## 2012-10-18 LAB — BASIC METABOLIC PANEL
BUN: 24 mg/dL — ABNORMAL HIGH (ref 6–23)
CO2: 27 mEq/L (ref 19–32)
GFR calc non Af Amer: 61 mL/min — ABNORMAL LOW (ref >90)
Glucose, Bld: 100 mg/dL — ABNORMAL HIGH (ref 70–99)
Potassium: 4 mEq/L (ref 3.5–5.1)

## 2012-10-18 LAB — CBC
HCT: 27.2 % — ABNORMAL LOW (ref 39.0–52.0)
Hemoglobin: 9 g/dL — ABNORMAL LOW (ref 13.0–17.0)
MCHC: 33.1 g/dL (ref 30.0–36.0)
RBC: 2.82 MIL/uL — ABNORMAL LOW (ref 4.22–5.81)

## 2012-10-18 MED ORDER — SILVER SULFADIAZINE 1 % EX CREA
TOPICAL_CREAM | Freq: Every day | CUTANEOUS | Status: DC
  Filled 2012-10-18: qty 85

## 2012-10-18 MED ORDER — GUAIFENESIN ER 600 MG PO TB12
600.0000 mg | ORAL_TABLET | Freq: Two times a day (BID) | ORAL | Status: DC
  Administered 2012-10-18 – 2012-10-24 (×13): 600 mg via ORAL
  Filled 2012-10-18 (×14): qty 1

## 2012-10-18 MED ORDER — VANCOMYCIN HCL 10 G IV SOLR
1500.0000 mg | INTRAVENOUS | Status: DC
  Administered 2012-10-19 – 2012-10-23 (×5): 1500 mg via INTRAVENOUS
  Filled 2012-10-18 (×6): qty 1500

## 2012-10-18 MED ORDER — BACITRACIN-NEOMYCIN-POLYMYXIN OINTMENT TUBE
TOPICAL_OINTMENT | Freq: Every day | CUTANEOUS | Status: DC
  Administered 2012-10-19 – 2012-10-21 (×3): via TOPICAL
  Administered 2012-10-23: 1 via TOPICAL
  Administered 2012-10-24: 10:00:00 via TOPICAL
  Filled 2012-10-18: qty 15

## 2012-10-18 MED ORDER — DOCUSATE SODIUM 100 MG PO CAPS
100.0000 mg | ORAL_CAPSULE | Freq: Two times a day (BID) | ORAL | Status: DC
  Administered 2012-10-18 – 2012-10-24 (×12): 100 mg via ORAL
  Filled 2012-10-18 (×7): qty 1

## 2012-10-18 NOTE — Progress Notes (Signed)
ANTIBIOTIC CONSULT NOTE - Follow Up  Pharmacy Consult for vancomycin Indication: cellulitis  No Known Allergies  Patient Measurements: Height: 5\' 9"  (175.3 cm) Weight: 185 lb (83.915 kg) IBW/kg (Calculated) : 70.7  Vital Signs: Temp: 98.7 F (37.1 C) (09/03 0555) Temp src: Oral (09/03 0555) BP: 136/69 mmHg (09/03 0555) Pulse Rate: 66 (09/03 0555) Intake/Output from previous day: 09/02 0701 - 09/03 0700 In: 75 [IV Piggyback:75] Out: 600 [Urine:600] Intake/Output from this shift:    Labs:  Recent Labs  10/17/12 1555 10/18/12 0410  WBC 7.0 5.4  HGB 9.4* 9.0*  PLT 145* 143*  CREATININE 1.45* 1.06   Estimated Creatinine Clearance: 50 ml/min (by C-G formula based on Cr of 1.06). No results found for this basename: VANCOTROUGH, VANCOPEAK, VANCORANDOM, GENTTROUGH, GENTPEAK, GENTRANDOM, TOBRATROUGH, TOBRAPEAK, TOBRARND, AMIKACINPEAK, AMIKACINTROU, AMIKACIN,  in the last 72 hours   Microbiology: No results found for this or any previous visit (from the past 720 hour(s)).  Medical History: Past Medical History  Diagnosis Date  . Hypertension   . Hearing loss   . Pacemaker   . Leg swelling 10/19/2011    LLE  . COPD (chronic obstructive pulmonary disease) 10/19/2011    "touch"  . Exertional dyspnea 10/19/2011  . OSA on CPAP 10/19/2011    "sometimes wear CPAP"  . Prostate cancer     S/P radiation tx ~ 2008  . History of blood transfusion     "think it was related to pacemaker placement"  . GERD (gastroesophageal reflux disease)   . Arthritis     "back"  . Chronic lower back pain    Assessment: 33 YOM presents with skin infection of LLE, on 8/21 he fell off his riding lawnmower and sustained a large laceration to his LLE requiring stitches.  He was placed on Amoxicillin/clavulonate and presented to PCP for follow-up and found to have worsening wound, orders to start IV vancomycin on 9/2 with ortho to evaluate. Note per ortho that a lot of necrotic tissue was removed at  bedside with patient now needing a skin graft and a large hematoma was also discovered  9/2 >> vancomycin >>  Tmax: afebrile WBCs = 5.4 Renal: SCr = 1.06, improved from 1.45  NO cultures at this time  Goal of Therapy:  Vancomycin trough level 10-15 mcg/ml  Plan:  1) Due to improved renal function, will change vancomycin dose from 1g q24 to 1500mg  q24 per protocol 2) Will continue to follow renal function for changes   Hessie Knows, PharmD, BCPS Pager [phone] 10/18/2012 9:18 AM

## 2012-10-18 NOTE — Progress Notes (Signed)
Physical Therapy  Wound Evaluation   Patient Details  Name: [name] [name] MRN: [id number] Date of Birth: 1924-11-26  Today's Date: 10/18/2012 Time:  [phone] [phone]  Subjective  Subjective: Do what you have to do Patient and Family Stated Goals: wound healing, hopeful will not need skin graft Date of Onset: 10/04/12 Prior Treatments: wound sutured in ED, bedside I&D this adm  Pain Score: Pain Score: 7   Wound Assessment                                                                                                       Wound 10/18/12 Laceration Leg Left;Medial large hematoma/necrotic/eschar (Active)  Site / Wound Assessment Red;Black 10/18/2012  4:00 PM  % Wound base Red or Granulating 0% 10/18/2012  4:00 PM  % Wound base Yellow 0% 10/18/2012  4:00 PM  % Wound base Black 100% 10/18/2012  4:00 PM  Peri-wound Assessment Erythema (blanchable);Hemosiderin 10/18/2012  4:00 PM  Wound Length (cm) 7.8 cm 10/18/2012  4:00 PM  Wound Width (cm) 7.2 cm 10/18/2012  4:00 PM  Margins Unattacted edges (unapproximated) 10/18/2012  4:00 PM  Closure Surface sutures 10/18/2012  4:00 PM  Drainage Amount Minimal 10/18/2012  4:00 PM  Drainage Description No odor;Sanguineous 10/18/2012  4:00 PM  Treatment Debridement (Selective);Hydrotherapy (Pulse lavage) 10/18/2012  4:00 PM  Dressing Type Silver dressings;Gauze (Comment);Compression wrap 10/18/2012  4:00 PM  Dressing Changed Changed 10/18/2012  4:00 PM     Hydrotherapy Pulsed lavage therapy - wound location: Left medial lower leg Pulsed Lavage with Suction (psi): 8 psi Pulsed Lavage with Suction - Normal Saline Used: 1000 mL Pulsed Lavage Tip: Tip with splash shield Selective Debridement Selective Debridement - Location: wound Selective Debridement - Tools Used: Forceps;Scissors Selective Debridement - Tissue Removed: clotted hematoma/necrotic tissue   Wound Assessment and Plan  Wound Therapy - Assess/Plan/Recommendations Wound  Therapy - Clinical Statement: pt will benefit form hydrotherapy for wound cleansing and deb to promote wound healing Wound Therapy - Functional Problem List: decr amb Hydrotherapy Plan: Debridement;Dressing change;Patient/family education;Pulsatile lavage with suction Wound Therapy - Frequency: 6X / week Wound Therapy - Current Recommendations: Other (comment) (surgery following) Wound Therapy - Follow Up Recommendations: Wound Care Center Wound Plan: cleansing and debridement  Wound Therapy Goals- Improve the function of patient's integumentary system by progressing the wound(s) through the phases of wound healing (inflammation - proliferation - remodeling) by: Decrease Necrotic Tissue to: 90 Decrease Necrotic Tissue - Progress: Goal set today Increase Granulation Tissue to: 10 Increase Granulation Tissue - Progress: Goal set today Patient/Family will be able to : verbalize dressing changes Patient/Family Instruction Goal - Progress: Goal set today Goals/treatment plan/discharge plan were made with and agreed upon by patient/family: Yes Time For Goal Achievement: 2 weeks Wound Therapy - Potential for Goals: Good  Goals will be updated until maximal potential achieved or discharge criteria met.  Discharge criteria: when goals achieved, discharge from hospital, MD decision/surgical intervention, no progress towards goals, refusal/missing three consecutive treatments without notification or medical reason.  GP     [facility] 10/18/2012,  4:56 PM

## 2012-10-18 NOTE — Consult Note (Addendum)
WOC consult Note Reason for Consult: Consult requested for right elbow wound. Dr Lajoyce Corners following for assessment and plan of care to leg wound. Pt had fall off a lawn mower last week and area was very dirty, according to him.  He received several stiches which have since been removed, he states. Pt appears well-informed regarding plan of care and has been treating with neosporin at home. Wound type:Full thickness healing wounds Measurement:patchy areas of wounds in area approx 6X6 over elbow Wound bed:50% dry and scabbed, 50% red and moist Drainage (amount, consistency, odor) Bleeds easily when cleansed, 90% of scabbed areas removed. Periwound:thin fragile bruised skin surrounding wounds Dressing procedure/placement/frequency: Neosporin to promote moist healing.  Foam dressing to protect and decrease discomfort with dressing changes. Please re-consult if further assistance is needed.  Thank-you,  Cammie Mcgee MSN, RN, CWOCN, [place], CNS [phone]

## 2012-10-18 NOTE — Progress Notes (Signed)
Patient ID: [name] [name], male   DOB: 06-26-1924, 79 y.o.   MRN: [id number] [facility] PROGRESS NOTE  [place] [name] [name]:[id number] DOB: February 28, 1924 DOA: 10/17/2012 PCP: Ginette Otto, MD  Brief narrative: Pt is 79 y.o. male with h/o prostate CA, HTN, COPD, fell off his riding lawnmower on 8/21 and sustained a large laceration to anterior medial aspect of left leg, subsequently went to the ER, this was sutured and he was sent home. His PCP started him on Augmentin and after one week of therapy his PCP re-evaluated the wound and sent pt to [facility] ED as wound has not been healing well.   Principal Problem:   Post-traumatic wound infection - large necrotic wound in the left leg area with large hematoma - appreciate Dr. Audrie Lia assistance - continuing daily hydrotherapy and IV Vancomycin  - Patient will require split thickness skin graft adn will follow upon further ortho recommendations  Active Problems:   Acute renal failure - secondary to pre renal failure - now resolved - BMP in AM   Anemia of chronic disease  - Hg and Hct stable and at pt's baseline - CBC in AM   COPD (chronic obstructive pulmonary disease) - clinically stable and maintaining oxygen saturations at target range, no wheezing on exam   Pacemaker - stable   Prostate cancer - stable    Atrial fibrillation - in sinus rhythm this AM, rate controlled   Cellulitis - continue Vancomycin IV   Consultants:  Ortho Dr. Lajoyce Corners Procedures/Studies: Dg Tibia/fibula Left  10/17/2012   No acute findings.  No evidence of fracture, osteomyelitis or soft tissue foreign body.   Antibiotics:  Vancomycin 9/2 -->   Code Status: DNR Family Communication: Pt at bedside Disposition Plan: Home when medically stable  HPI/Subjective: No events overnight.   Objective: Filed Vitals:   10/17/12 1430 10/17/12 1814 10/17/12 2159 10/18/12 0555  BP: 134/75  156/74 136/69  Pulse: 73  71 66  Temp: 97.8 F (36.6 C)  99.4 F (37.4  C) 98.7 F (37.1 C)  TempSrc: Oral  Oral Oral  Resp: 16  16 16   Height:  5\' 9"  (1.753 m)    Weight:  83.915 kg (185 lb)    SpO2: 99%  94% 94%    Intake/Output Summary (Last 24 hours) at 10/18/12 1153 Last data filed at 10/18/12 0900  Gross per 24 hour  Intake    315 ml  Output    600 ml  Net   -285 ml    Exam:   General:  Pt is alert, follows commands appropriately, not in acute distress  Cardiovascular: Regular rate and rhythm, S1/S2, no murmurs, no rubs, no gallops  Respiratory: Clear to auscultation bilaterally, no wheezing, no crackles, no rhonchi  Abdomen: Soft, non tender, non distended, bowel sounds present, no guarding  Extremities: Bilateral chronic venous stasis changes, pulses DP and PT palpable bilaterally, bilateral LE pitting edema, ant-med area ulcer 7 cm in diameter  Neuro: Grossly nonfocal   Data Reviewed: Basic Metabolic Panel:  Recent Labs Lab 10/17/12 1555 10/18/12 0410  NA 138 136  K 4.2 4.0  CL 100 102  CO2 30 27  GLUCOSE 121* 100*  BUN 30* 24*  CREATININE 1.45* 1.06  CALCIUM 9.5 8.6   Liver Function Tests:  Recent Labs Lab 10/17/12 1555  AST 21  ALT 11  ALKPHOS 83  BILITOT 0.4  PROT 6.5  ALBUMIN 3.2*   CBC:  Recent Labs Lab 10/17/12 1555 10/18/12 0410  WBC 7.0 5.4  HGB 9.4* 9.0*  HCT 28.4* 27.2*  MCV 97.9 96.5  PLT 145* 143*   Scheduled Meds: . aspirin EC  81 mg Oral Daily  . docusate sodium  100 mg Oral BID  . enoxaparin  injection  40 mg Subcutaneous Q24H  . guaiFENesin  600 mg Oral BID  . lisinopril  10 mg Oral Daily  . pantoprazole  40 mg Oral Daily  . vancomycin  1,500 mg Intravenous Q24H   Continuous Infusions:    Debbora Presto, MD  [facility] Pager [phone]  If 7PM-7AM, please contact night-coverage [email or web address] Password [email or web address] 10/18/2012, 11:53 AM   LOS: 1 day

## 2012-10-19 ENCOUNTER — Inpatient Hospital Stay (HOSPITAL_COMMUNITY): Payer: Medicare Other

## 2012-10-19 DIAGNOSIS — J438 Other emphysema: Secondary | ICD-10-CM | POA: Diagnosis not present

## 2012-10-19 DIAGNOSIS — D649 Anemia, unspecified: Secondary | ICD-10-CM | POA: Diagnosis not present

## 2012-10-19 DIAGNOSIS — L0291 Cutaneous abscess, unspecified: Secondary | ICD-10-CM | POA: Diagnosis not present

## 2012-10-19 DIAGNOSIS — I4891 Unspecified atrial fibrillation: Secondary | ICD-10-CM | POA: Diagnosis not present

## 2012-10-19 LAB — CBC
HCT: 27.4 % — ABNORMAL LOW (ref 39.0–52.0)
Platelets: 136 10*3/uL — ABNORMAL LOW (ref 150–400)
RDW: 13 % (ref 11.5–15.5)
WBC: 6.7 10*3/uL (ref 4.0–10.5)

## 2012-10-19 LAB — BASIC METABOLIC PANEL
Chloride: 100 mEq/L (ref 96–112)
GFR calc Af Amer: 85 mL/min — ABNORMAL LOW (ref >90)
GFR calc non Af Amer: 73 mL/min — ABNORMAL LOW (ref >90)
Potassium: 3.7 mEq/L (ref 3.5–5.1)
Sodium: 135 mEq/L (ref 135–145)

## 2012-10-19 MED ORDER — SODIUM CHLORIDE 0.9 % IJ SOLN: 10.0000 mL | INTRAMUSCULAR | Status: DC | PRN

## 2012-10-19 NOTE — Progress Notes (Signed)
Peripherally Inserted Central Catheter/Midline Placement  The IV Nurse has discussed with the patient and/or persons authorized to consent for the patient, the purpose of this procedure and the potential benefits and risks involved with this procedure.  The benefits include less needle sticks, lab draws from the catheter and patient may be discharged home with the catheter.  Risks include, but not limited to, infection, bleeding, blood clot (thrombus formation), and puncture of an artery; nerve damage and irregular heat beat.  Alternatives to this procedure were also discussed.  PICC/Midline Placement Documentation        Mellissa Kohut 10/19/2012, 8:50 AM

## 2012-10-19 NOTE — Progress Notes (Signed)
Patient ID: [name] [name], male   DOB: 1925/01/06, 79 y.o.   MRN: [id number]  [facility] PROGRESS NOTE  [name] [name] WUJ:[id number] DOB: 20-Nov-1924 DOA: 10/17/2012 PCP: Ginette Otto, MD  Brief narrative:  Pt is 79 y.o. male with h/o prostate CA, HTN, COPD, fell off his riding lawnmower on 8/21 and sustained a large laceration to anterior medial aspect of left leg, subsequently went to the ER, this was sutured and he was sent home. His PCP started him on Augmentin and after one week of therapy his PCP re-evaluated the wound and sent pt to [facility] ED as wound has not been healing well.   Principal Problem:  Post-traumatic wound infection  - large necrotic wound in the left leg area with large hematoma  - appreciate Dr. Audrie Lia assistance  - continuing daily hydrotherapy and IV Vancomycin  - Patient may require split thickness skin graft, will follow upon further ortho recommendations  Active Problems:  Acute renal failure  - secondary to pre renal failure, pt has responded well to IVF, will d/c IVF  - now resolved  - BMP in AM  Anemia of chronic disease  - Hg and Hct stable and at pt's baseline  - no signs of active bleed - CBC in AM  COPD (chronic obstructive pulmonary disease)  - clinically stable and maintaining oxygen saturations at target range, no wheezing on exam  Pacemaker  - stable  Prostate cancer  - stable  Atrial fibrillation  - in sinus rhythm this AM, rate controlled  Cellulitis  - continue Vancomycin IV   Consultants:  Ortho Dr. Lajoyce Corners Procedures/Studies:  Dg Tibia/fibula Left 10/17/2012 No acute findings. No evidence of fracture, osteomyelitis or soft tissue foreign body.  Antibiotics:  Vancomycin 9/2 -->   Code Status: DNR  Family Communication: Pt at bedside  Disposition Plan: Home when medically stable    HPI/Subjective: No events overnight.   Objective: Filed Vitals:   10/18/12 0555 10/18/12 1420 10/18/12 2132 10/19/12 0537  BP: 136/69  135/71 151/68 107/61  Pulse: 66 70 71 69  Temp: 98.7 F (37.1 C) 99.3 F (37.4 C) 101 F (38.3 C) 98.8 F (37.1 C)  TempSrc: Oral  Oral Oral  Resp: 16 16 14 16   Height:      Weight:      SpO2: 94% 93% 93% 90%    Intake/Output Summary (Last 24 hours) at 10/19/12 0843 Last data filed at 10/19/12 0813  Gross per 24 hour  Intake    720 ml  Output    650 ml  Net     70 ml    Exam:   General:  Pt is alert, follows commands appropriately, not in acute distress  Cardiovascular: Regular rate and rhythm, S1/S2, no murmurs, no rubs, no gallops  Respiratory: Clear to auscultation bilaterally, no wheezing, no crackles, no rhonchi  Abdomen: Soft, non tender, non distended, bowel sounds present, no guarding  Extremities: Bilateral chronic venous stasis changes, pulses DP and PT palpable bilaterally, bilateral LE pitting edema, ant-med area ulcer 7 cm in diameter  Neuro: Grossly nonfocal  Data Reviewed: Basic Metabolic Panel:  Recent Labs Lab 10/17/12 1555 10/18/12 0410 10/19/12 0429  NA 138 136 135  K 4.2 4.0 3.7  CL 100 102 100  CO2 30 27 28   GLUCOSE 121* 100* 102*  BUN 30* 24* 21  CREATININE 1.45* 1.06 0.95  CALCIUM 9.5 8.6 8.8   Liver Function Tests:  Recent Labs Lab 10/17/12 1555  AST 21  ALT 11  ALKPHOS 83  BILITOT 0.4  PROT 6.5  ALBUMIN 3.2*   CBC:  Recent Labs Lab 10/17/12 1555 10/18/12 0410 10/19/12 0429  WBC 7.0 5.4 6.7  HGB 9.4* 9.0* 9.2*  HCT 28.4* 27.2* 27.4*  MCV 97.9 96.5 96.1  PLT 145* 143* 136*     Scheduled Meds: . aspirin EC  81 mg Oral Daily  . docusate sodium  100 mg Oral BID  . enoxaparin (LOVENOX) injection  40 mg Subcutaneous Q24H  . guaiFENesin  600 mg Oral BID  . lisinopril  10 mg Oral Daily  . multivitamin with minerals  1 tablet Oral Daily  . neomycin-bacitracin-polymyxin   Topical Daily  . pantoprazole  40 mg Oral Daily  . silver sulfADIAZINE   Topical Daily  . vancomycin  1,500 mg Intravenous Q24H   Continuous  Infusions:    Debbora Presto, MD  [facility] Pager [phone]  If 7PM-7AM, please contact night-coverage [email or web address] Password [email or web address] 10/19/2012, 8:43 AM   LOS: 2 days

## 2012-10-19 NOTE — Progress Notes (Signed)
Notified K. Schorr,N.P regarding the patient need for a PICC line insertion today due to unsuccessful attempts to restart   peripheral IV by IV team yesterday. Order for PICC line received.

## 2012-10-19 NOTE — Progress Notes (Signed)
10/19/12 1300  Subjective Assessment  Subjective ok  Patient and Family Stated Goals wound healing, hopeful will not need skin graft  Date of Onset 10/04/12  Prior Treatments wound sutured in ED, bedside I&D this adm  Wound 10/18/12 Laceration Leg Left;Medial large hematoma/necrotic/eschar  Date First Assessed/Time First Assessed: 10/18/12 1450   Wound Type: Laceration  Location: Leg  Location Orientation: Left;Medial  Wound Description (Comments): large hematoma/necrotic/eschar  Present on Admission: Yes  Site / Wound Assessment Red;Black;Bleeding;Yellow  % Wound base Red or Granulating (2%)  % Wound base Yellow 5%  % Wound base Other (Comment) (93% hematoma)  Peri-wound Assessment Erythema (blanchable);Hemosiderin  Wound Length (cm) 7.8 cm  Wound Width (cm) 7.2 cm  Margins Unattacted edges (unapproximated)  Drainage Amount Minimal  Drainage Description No odor;Sanguineous  Treatment Cleansed;Debridement (Selective);Hydrotherapy (Pulse lavage)  Dressing Type Silver dressings;Gauze (Comment);Compression wrap  Dressing Changed Changed  Hydrotherapy  Pulsed Lavage with Suction (psi) 8 psi  Pulsed Lavage with Suction - Normal Saline Used 1000 mL  Pulsed Lavage Tip Tip with splash shield  Pulsed lavage therapy - wound location Left medial lower leg  Selective Debridement  Selective Debridement - Location left medial LE wound  Selective Debridement - Tools Used Forceps;Scissors  Selective Debridement - Tissue Removed clotted hematoma/necrotic tissue  Wound Therapy - Assess/Plan/Recommendations  Wound Therapy - Clinical Statement pt will benefit form hydrotherapy for wound cleansing and deb to promote wound healing  Wound Therapy - Functional Problem List decr amb  Hydrotherapy Plan Debridement;Dressing change;Patient/family education;Pulsatile lavage with suction  Wound Therapy - Frequency 6X / week  Wound Therapy - Follow Up Recommendations [facility]  Wound Plan cleansing and  debridement  Wound Therapy Goals - Improve the function of patient's integumentary system by progressing the wound(s) through the phases of wound healing by:  Decrease Necrotic Tissue to 90  Decrease Necrotic Tissue - Progress Progressing toward goal  Increase Granulation Tissue to 10  Increase Granulation Tissue - Progress Progressing toward goal  Patient/Family will be able to  verbalize dressing changes  Patient/Family Instruction Goal - Progress Progressing toward goal  Goals/treatment plan/discharge plan were made with and agreed upon by patient/family Yes  Time For Goal Achievement 2 weeks  Wound Therapy - Potential for Goals Good

## 2012-10-20 ENCOUNTER — Encounter (HOSPITAL_COMMUNITY): Payer: Self-pay | Admitting: Registered Nurse

## 2012-10-20 ENCOUNTER — Inpatient Hospital Stay (HOSPITAL_COMMUNITY): Payer: Medicare Other | Admitting: Registered Nurse

## 2012-10-20 ENCOUNTER — Encounter (HOSPITAL_COMMUNITY)
Admission: AD | Disposition: A | Discharge status: Home Health | Payer: Self-pay | Source: Ambulatory Visit | Attending: Internal Medicine

## 2012-10-20 DIAGNOSIS — N179 Acute kidney failure, unspecified: Secondary | ICD-10-CM | POA: Diagnosis not present

## 2012-10-20 DIAGNOSIS — L0291 Cutaneous abscess, unspecified: Secondary | ICD-10-CM | POA: Diagnosis not present

## 2012-10-20 DIAGNOSIS — I4891 Unspecified atrial fibrillation: Secondary | ICD-10-CM | POA: Diagnosis not present

## 2012-10-20 DIAGNOSIS — L97909 Non-pressure chronic ulcer of unspecified part of unspecified lower leg with unspecified severity: Secondary | ICD-10-CM | POA: Diagnosis not present

## 2012-10-20 DIAGNOSIS — C61 Malignant neoplasm of prostate: Secondary | ICD-10-CM | POA: Diagnosis not present

## 2012-10-20 DIAGNOSIS — S81009A Unspecified open wound, unspecified knee, initial encounter: Secondary | ICD-10-CM | POA: Diagnosis not present

## 2012-10-20 DIAGNOSIS — D649 Anemia, unspecified: Secondary | ICD-10-CM | POA: Diagnosis not present

## 2012-10-20 DIAGNOSIS — W28XXXA Contact with powered lawn mower, initial encounter: Secondary | ICD-10-CM | POA: Diagnosis not present

## 2012-10-20 DIAGNOSIS — L02419 Cutaneous abscess of limb, unspecified: Secondary | ICD-10-CM | POA: Diagnosis not present

## 2012-10-20 HISTORY — PX: I & D EXTREMITY: SHX5045

## 2012-10-20 LAB — BASIC METABOLIC PANEL
BUN: 22 mg/dL (ref 6–23)
Chloride: 101 mEq/L (ref 96–112)
GFR calc Af Amer: 86 mL/min — ABNORMAL LOW (ref >90)
Glucose, Bld: 102 mg/dL — ABNORMAL HIGH (ref 70–99)
Potassium: 4 mEq/L (ref 3.5–5.1)
Sodium: 133 mEq/L — ABNORMAL LOW (ref 135–145)

## 2012-10-20 LAB — CBC
HCT: 27 % — ABNORMAL LOW (ref 39.0–52.0)
Hemoglobin: 8.8 g/dL — ABNORMAL LOW (ref 13.0–17.0)
RBC: 2.79 MIL/uL — ABNORMAL LOW (ref 4.22–5.81)
WBC: 5.7 10*3/uL (ref 4.0–10.5)

## 2012-10-20 LAB — EXPECTORATED SPUTUM ASSESSMENT W GRAM STAIN, RFLX TO RESP C: Special Requests: NORMAL

## 2012-10-20 LAB — SURGICAL PCR SCREEN: Staphylococcus aureus: NEGATIVE

## 2012-10-20 SURGERY — IRRIGATION AND DEBRIDEMENT EXTREMITY
Anesthesia: General | Site: Leg Lower | Laterality: Left | Wound class: Dirty or Infected

## 2012-10-20 MED ORDER — HYDROMORPHONE HCL PF 1 MG/ML IJ SOLN
INTRAMUSCULAR | Status: AC
  Filled 2012-10-20: qty 1

## 2012-10-20 MED ORDER — SODIUM CHLORIDE 0.9 % IV SOLN
INTRAVENOUS | Status: DC
  Administered 2012-10-20 – 2012-10-22 (×2): via INTRAVENOUS

## 2012-10-20 MED ORDER — OXYCODONE HCL 5 MG/5ML PO SOLN
5.0000 mg | Freq: Once | ORAL | Status: DC | PRN
  Filled 2012-10-20: qty 5

## 2012-10-20 MED ORDER — 0.9 % SODIUM CHLORIDE (POUR BTL) OPTIME
TOPICAL | Status: DC | PRN
  Administered 2012-10-20: 1000 mL

## 2012-10-20 MED ORDER — SODIUM CHLORIDE 0.9 % IR SOLN
Status: DC | PRN
  Administered 2012-10-20: 3000 mL

## 2012-10-20 MED ORDER — LACTATED RINGERS IV SOLN
INTRAVENOUS | Status: DC | PRN
  Administered 2012-10-20: 11:00:00 via INTRAVENOUS

## 2012-10-20 MED ORDER — PROPOFOL 10 MG/ML IV BOLUS
INTRAVENOUS | Status: DC | PRN
  Administered 2012-10-20: 130 mg via INTRAVENOUS

## 2012-10-20 MED ORDER — MEPERIDINE HCL 50 MG/ML IJ SOLN: 6.2500 mg | INTRAMUSCULAR | Status: DC | PRN

## 2012-10-20 MED ORDER — OXYCODONE HCL 5 MG PO TABS: 5.0000 mg | ORAL_TABLET | Freq: Once | ORAL | Status: DC | PRN

## 2012-10-20 MED ORDER — HYDROMORPHONE HCL PF 1 MG/ML IJ SOLN
0.2500 mg | INTRAMUSCULAR | Status: DC | PRN
  Administered 2012-10-20 (×6): 0.25 mg via INTRAVENOUS

## 2012-10-20 MED ORDER — PROMETHAZINE HCL 25 MG/ML IJ SOLN: 6.2500 mg | INTRAMUSCULAR | Status: DC | PRN

## 2012-10-20 MED ORDER — PHENYLEPHRINE HCL 10 MG/ML IJ SOLN
INTRAMUSCULAR | Status: DC | PRN
  Administered 2012-10-20: 80 ug via INTRAVENOUS

## 2012-10-20 MED ORDER — LIDOCAINE HCL (CARDIAC) 20 MG/ML IV SOLN
INTRAVENOUS | Status: DC | PRN
  Administered 2012-10-20: 60 mg via INTRAVENOUS

## 2012-10-20 MED ORDER — FENTANYL CITRATE 0.05 MG/ML IJ SOLN
INTRAMUSCULAR | Status: DC | PRN
  Administered 2012-10-20 (×5): 50 ug via INTRAVENOUS

## 2012-10-20 SURGICAL SUPPLY — 40 items
BAG SPEC THK2 15X12 ZIP CLS (MISCELLANEOUS) ×1
BAG ZIPLOCK 12X15 (MISCELLANEOUS) ×2 IMPLANT
BANDAGE ESMARK 6X9 LF (GAUZE/BANDAGES/DRESSINGS) ×1 IMPLANT
BANDAGE GAUZE ELAST BULKY 4 IN (GAUZE/BANDAGES/DRESSINGS) ×1 IMPLANT
BNDG CMPR 9X6 STRL LF SNTH (GAUZE/BANDAGES/DRESSINGS)
BNDG ESMARK 6X9 LF (GAUZE/BANDAGES/DRESSINGS)
CLOTH BEACON ORANGE TIMEOUT ST (SAFETY) ×2 IMPLANT
CUFF TOURN SGL QUICK 18 (TOURNIQUET CUFF) IMPLANT
CUFF TOURN SGL QUICK 24 (TOURNIQUET CUFF)
CUFF TOURN SGL QUICK 34 (TOURNIQUET CUFF)
CUFF TRNQT CYL 24X4X40X1 (TOURNIQUET CUFF) IMPLANT
CUFF TRNQT CYL 34X4X40X1 (TOURNIQUET CUFF) IMPLANT
DRAIN PENROSE 18X1/2 LTX STRL (DRAIN) ×1 IMPLANT
DRAPE EXTREMITY T 121X128X90 (DRAPE) ×1 IMPLANT
DRAPE U-SHAPE 47X51 STRL (DRAPES) ×2 IMPLANT
DRSG PAD ABDOMINAL 8X10 ST (GAUZE/BANDAGES/DRESSINGS) ×2 IMPLANT
DRSG VAC ATS SM SENSATRAC (GAUZE/BANDAGES/DRESSINGS) ×1 IMPLANT
DURAPREP 26ML APPLICATOR (WOUND CARE) ×1 IMPLANT
ELECT REM PT RETURN 9FT ADLT (ELECTROSURGICAL) ×2
ELECTRODE REM PT RTRN 9FT ADLT (ELECTROSURGICAL) ×1 IMPLANT
GLOVE BIO SURGEON STRL SZ7.5 (GLOVE) ×2 IMPLANT
GLOVE BIO SURGEON STRL SZ8 (GLOVE) ×2 IMPLANT
GLOVE BIOGEL PI IND STRL 8 (GLOVE) ×3 IMPLANT
GLOVE BIOGEL PI INDICATOR 8 (GLOVE) ×3
GOWN STRL NON-REIN LRG LVL3 (GOWN DISPOSABLE) ×1 IMPLANT
GOWN STRL REIN XL XLG (GOWN DISPOSABLE) ×3 IMPLANT
HANDPIECE INTERPULSE COAX TIP (DISPOSABLE)
KIT BASIN OR (CUSTOM PROCEDURE TRAY) ×2 IMPLANT
MANIFOLD NEPTUNE II (INSTRUMENTS) ×1 IMPLANT
PACK TOTAL JOINT (CUSTOM PROCEDURE TRAY) ×2 IMPLANT
PAD CAST 4YDX4 CTTN HI CHSV (CAST SUPPLIES) ×1 IMPLANT
PADDING CAST COTTON 4X4 STRL (CAST SUPPLIES)
POSITIONER SURGICAL ARM (MISCELLANEOUS) ×1 IMPLANT
SET HNDPC FAN SPRY TIP SCT (DISPOSABLE) ×1 IMPLANT
SPONGE GAUZE 4X4 12PLY (GAUZE/BANDAGES/DRESSINGS) ×1 IMPLANT
SUT MNCRL AB 4-0 PS2 18 (SUTURE) ×1 IMPLANT
SUT VIC AB 2-0 CT1 27 (SUTURE)
SUT VIC AB 2-0 CT1 TAPERPNT 27 (SUTURE) ×2 IMPLANT
SYR CONTROL 10ML LL (SYRINGE) ×1 IMPLANT
TOWEL OR 17X26 10 PK STRL BLUE (TOWEL DISPOSABLE) ×4 IMPLANT

## 2012-10-20 NOTE — Transfer of Care (Signed)
Immediate Anesthesia Transfer of Care Note  Patient: [name] [name]  Procedure(s) Performed: Procedure(s): LEFT LEG IRRIGATION AND DEBRIDEMENT, AND WOUND VAC APPLICATION (Left)  Patient Location: PACU  Anesthesia Type:General  Level of Consciousness: awake, alert , oriented and patient cooperative  Airway & Oxygen Therapy: Patient Spontanous Breathing and Patient connected to face mask oxygen  Post-op Assessment: Report given to PACU RN, Post -op Vital signs reviewed and stable and Patient moving all extremities X 4  Post vital signs: stable  Complications: No apparent anesthesia complications

## 2012-10-20 NOTE — Progress Notes (Addendum)
Patient ID: [name] [name], male   DOB: 16-Mar-1924, 79 y.o.   MRN: [id number] [facility] PROGRESS NOTE  JAVONTAY VANDAM WUJ:[id number] DOB: 1925/01/15 DOA: 10/17/2012 PCP: Ginette Otto, MD  Brief narrative:  Pt is 79 y.o. male with h/o prostate CA, HTN, COPD, fell off his riding lawnmower on 8/21 and sustained a large laceration to anterior medial aspect of left leg, subsequently went to the ER, this was sutured and he was sent home. His PCP started him on Augmentin and after one week of therapy his PCP re-evaluated the wound and sent pt to [facility] ED as wound has not been healing well.   Principal Problem:  Post-traumatic wound infection  - large necrotic wound in the left leg area with large hematoma  - appreciate Dr. Audrie Lia assistance  - continuing daily hydrotherapy and IV Vancomycin  - plan for OR today for I&D, keep NPO Active Problems:  Acute renal failure  - secondary to pre renal failure, pt has responded well to IVF - now resolved  - BMP in AM  Anemia of chronic disease with component of acute blood loss anemia  - Hg and Hct, slight drop over night - no signs of active bleed  - CBC in AM  COPD (chronic obstructive pulmonary disease)  - clinically stable and maintaining oxygen saturations at target range, no wheezing on exam  - more cough on exam this am,no crackles, non productive cough - continue Mucinex and reassess in AM Pacemaker  - stable  Prostate cancer  - stable  Atrial fibrillation  - in sinus rhythm this AM, rate controlled  Cellulitis  - continue Vancomycin IV   Consultants:  Ortho Dr. Lajoyce Corners Procedures/Studies:  Dg Tibia/fibula Left 10/17/2012 No acute findings. No evidence of fracture, osteomyelitis or soft tissue foreign body.  Antibiotics:  Vancomycin 9/2 -->   Code Status: DNR  Family Communication: Pt at bedside  Disposition Plan: Home when medically stable   HPI/Subjective: No events overnight.   Objective: Filed Vitals:   10/19/12  0940 10/19/12 1401 10/19/12 2058 10/20/12 0614  BP: 137/76 145/72 115/66 125/69  Pulse: 85 64 75 64  Temp: 98 F (36.7 C) 97.9 F (36.6 C) 99 F (37.2 C) 99.5 F (37.5 C)  TempSrc: Oral Oral Oral Oral  Resp: 17 16 16 14   Height:      Weight:      SpO2: 95% 95% 93% 93%    Intake/Output Summary (Last 24 hours) at 10/20/12 0842 Last data filed at 10/20/12 0311  Gross per 24 hour  Intake    680 ml  Output    450 ml  Net    230 ml    Exam:   General:  Pt is alert, follows commands appropriately, not in acute distress  Cardiovascular: Regular rate and rhythm, S1/S2, no murmurs, no rubs, no gallops  Respiratory: Clear to auscultation bilaterally, no wheezing, no crackles, no rhonchi  Abdomen: Soft, non tender, non distended, bowel sounds present, no guarding  Neuro: Grossly nonfocal  Data Reviewed: Basic Metabolic Panel:  Recent Labs Lab 10/17/12 1555 10/18/12 0410 10/19/12 0429 10/20/12 0425  NA 138 136 135 133*  K 4.2 4.0 3.7 4.0  CL 100 102 100 101  CO2 30 27 28 28   GLUCOSE 121* 100* 102* 102*  BUN 30* 24* 21 22  CREATININE 1.45* 1.06 0.95 0.93  CALCIUM 9.5 8.6 8.8 8.6   Liver Function Tests:  Recent Labs Lab 10/17/12 1555  AST 21  ALT 11  ALKPHOS 83  BILITOT 0.4  PROT 6.5  ALBUMIN 3.2*   CBC:  Recent Labs Lab 10/17/12 1555 10/18/12 0410 10/19/12 0429 10/20/12 0425  WBC 7.0 5.4 6.7 5.7  HGB 9.4* 9.0* 9.2* 8.8*  HCT 28.4* 27.2* 27.4* 27.0*  MCV 97.9 96.5 96.1 96.8  PLT 145* 143* 136* 146*    Scheduled Meds: . aspirin EC  81 mg Oral Daily  . docusate sodium  100 mg Oral BID  . enoxaparin (LOVENOX) injection  40 mg Subcutaneous Q24H  . guaiFENesin  600 mg Oral BID  . lisinopril  10 mg Oral Daily  . multivitamin with minerals  1 tablet Oral Daily  . neomycin-bacitracin-polymyxin   Topical Daily  . pantoprazole  40 mg Oral Daily  . silver sulfADIAZINE   Topical Daily  . vancomycin  1,500 mg Intravenous Q24H   Continuous Infusions:     Debbora Presto, MD  [facility] Pager [phone]  If 7PM-7AM, please contact night-coverage [email or web address] Password [email or web address] 10/20/2012, 8:42 AM   LOS: 3 days

## 2012-10-20 NOTE — Brief Op Note (Signed)
Brief Op Note  Date of Surgery: 10/20/2012  Preoperative Diagnosis: Pre-Op Diagnosis Codes:    * Leg wound, left, initial encounter [891.0]  Postoperative Diagnosis: same  Procedure: Procedure(s): LEFT LEG IRRIGATION AND DEBRIDEMENT, AND WOUND VAC APPLICATION  Implants: none  Surgeons: Surgeon(s): Naiping Glee Arvin, MD  Anesthesia: Choice  Drains: VAC  Estimated Blood Loss: 25 cc  Complications: None  Condition to PACU: Stable  Disposition: Plan to return to OR on Sunday for repeat I&D, possible skin grafting at that time.  Cheral Almas, MD [facility] 10/20/2012 12:40 PM

## 2012-10-20 NOTE — Anesthesia Postprocedure Evaluation (Signed)
  Anesthesia Post-op Note  Patient: [name] [name]  Procedure(s) Performed: Procedure(s) (LRB): LEFT LEG IRRIGATION AND DEBRIDEMENT, AND WOUND VAC APPLICATION (Left)  Patient Location: PACU  Anesthesia Type: General  Level of Consciousness: awake and alert   Airway and Oxygen Therapy: Patient Spontanous Breathing  Post-op Pain: mild  Post-op Assessment: Post-op Vital signs reviewed, Patient's Cardiovascular Status Stable, Respiratory Function Stable, Patent Airway and No signs of Nausea or vomiting  Last Vitals:  Filed Vitals:   10/20/12 1245  BP: 139/47  Pulse: 60  Temp: 36.3 C  Resp: 13    Post-op Vital Signs: stable   Complications: No apparent anesthesia complications

## 2012-10-20 NOTE — Progress Notes (Signed)
Physical Therapy Discharge Patient Details Name: KAELOB PERSKY MRN: [id number] DOB: 20-Jun-1924 Today's Date: 10/20/2012 Time:  -     Patient discharged from PT services secondary to surgery - will need to re-order PT to resume therapy services.  Please see latest therapy progress note for current level of functioning and progress toward goals.      GP     Sharen Heck PT [phone]  10/20/2012, 2:56 PM

## 2012-10-20 NOTE — Op Note (Signed)
Date of Surgery: 10/20/2012  INDICATIONS: [name] [name] is a 79 y.o.-year-old male who was involved in a lawnmower accident and sustained a left leg wound. The risks and benefits of the procedure discussed with the patient and family prior to the procedure and all questions were answered; consent was obtained.  PREOPERATIVE DIAGNOSIS:  Left leg wound 7 cm x 7 cm  POSTOPERATIVE DIAGNOSIS: Same  PROCEDURE: 1. Debridement of muscle and/or fascia first 20 sq cm. CPT 11043 2. Application of negative pressure wound therapy less than 50 sq cm. CPT [id number]. 3. Debridement of muscle and/or fascia each additional 20 sq cm.  CPT 11046 x 2  SURGEON: N. Glee Arvin, M.D.  ANESTHESIA:  general  IV FLUIDS AND URINE: See anesthesia record.  ESTIMATED BLOOD LOSS: 25 mL.   DRAINS: VAC  COMPLICATIONS: None.  DESCRIPTION OF PROCEDURE: The patient was brought to the operating room and placed supine on the operating table.  The patient's leg had been signed prior to the procedure.  The patient had the anesthesia placed by the anesthesiologist.  The prep verification and incision time-outs were performed to confirm that this was the correct patient, site, side and location. The patient had an SCD on the opposite lower extremity. The patient did receive antibiotics prior to the incision and was re-dosed during the procedure as needed at indicated intervals.  The patient had the lower extremity prepped and draped in the standard surgical fashion.  The wound was inspected and found to be not ready to accept a graft.  There was purulence and necrotic tissue in the wound bed and skin edges.  The wound was sharply debrided with a knife back to a healthy bleeding red tissue bed.  Three liters of normal saline was irrigated through the wound.  A VAC device was placed on the wound.  POSTOPERATIVE PLAN: [name] [name] will be WBAT and will return in 48 hours for repeat I&D and possible skin grafting at that time.

## 2012-10-20 NOTE — Clinical Documentation Improvement (Signed)
THIS DOCUMENT IS NOT A PERMANENT PART OF THE MEDICAL RECORD  Please update your documentation with the medical record to reflect your response to this query. If you need help knowing how to do this please call [phone]   10/20/12  Dear Dr.  Marton Redwood  In an effort to better capture your patient's severity of illness, reflect appropriate length of stay and utilization of resources, a review of the patient medical record has revealed the following indicators.    Based on your clinical judgment, please clarify and document in a progress note and/or discharge summary the clinical condition associated with the following supporting information:  In responding to this query please exercise your independent judgment.  The fact that a query is asked, does not imply that any particular answer is desired or expected.     Pt with anemia of chronic disease.  Clarification Needed   Please clarify if "anemia of chronic disease," can be further specified as one of the diagnoses listed below and document in pn or d/c summary.     Possible Clinical Conditions?   " Expected Acute Blood Loss Anemia  " Acute Blood Loss Anemia  " Acute on chronic blood loss anemia  " Iron deficiency anemia  " Pernicious anemia  " Other Condition________________  " Cannot Clinically Determine  Risk Factors: (recent surgery, pre op anemia, EBL in OR)  Supporting Information: Cellulitis of leg AFIB Acute kidney failure Prostate CA COPD  Signs and Symptoms   Diagnostics: Component     Latest Ref Rng 10/17/2012 10/18/2012 10/19/2012 10/20/2012  Hemoglobin     13.0 - 17.0 g/dL 9.4 (L) 9.0 (L) 9.2 (L) 8.8 (L)  HCT     39.0 - 52.0 % 28.4 (L) 27.2 (L) 27.4 (L) 27.0 (L)   Treatments: Monitoring Medications prior to admission Aranesp Multivitimins Vit B12    You may use possible, probable, or suspect with inpatient documentation. Possible, probable, suspected diagnoses MUST be documented at the time  of discharge.  Reviewed: document in PN 10/20/2012 thank you Jacelyn Cuen  Thank You,  Philbert Riser J Henley  Lolita J. Ambrose Mantle RN, BSN, MSN/Inf, CCDS Clinical Documentation Specialist [facility] Dept Pager: [phone]  [phone] Health Information Management [place]

## 2012-10-20 NOTE — H&P (Signed)
PREOPERATIVE H&P  Chief Complaint: left leg wound  HPI: [name] [name] is a 79 y.o. male who presents for preoperative history and physical with a diagnosis of left leg wound. Symptoms are rated as moderate to severe, and have been worsening.  This is significantly impairing activities of daily living.  He has elected for surgical management.   Past Medical History  Diagnosis Date  . Hypertension   . Hearing loss   . Pacemaker   . Leg swelling 10/19/2011    LLE  . COPD (chronic obstructive pulmonary disease) 10/19/2011    "touch"  . Exertional dyspnea 10/19/2011  . OSA on CPAP 10/19/2011    "sometimes wear CPAP"  . Prostate cancer     S/P radiation tx ~ 2008  . History of blood transfusion     "think it was related to pacemaker placement"  . GERD (gastroesophageal reflux disease)   . Arthritis     "back"  . Chronic lower back pain    Past Surgical History  Procedure Laterality Date  . Hernia repair  ~ 2010;     lap; VHR  . Hernia repair  ~ 2011    lap vh complicated by recurrence  . Hernia repair  09/2011    open; VHR  . Insert / replace / remove pacemaker  ~ 2009    initial placement  . Cataract extraction w/ intraocular lens  implant, bilateral    . Joint replacement    . Total knee arthroplasty  1990's    right  . Cholecystectomy  10/23/2011  . Cholecystectomy  10/23/2011    Procedure: CHOLECYSTECTOMY;  Surgeon: Emelia Loron, MD;  Location: [facility] OR;  Service: General;  Laterality: N/A;  with intraoperative cholangiogram   History   Social History  . Marital Status: Married    Spouse Name: martha    Number of Children: Y  . Years of Education: N/A   Occupational History  . 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 Smoking status: Former Smoker -- 1.50 packs/day for 35 years    Types: Cigarettes    Quit date: 02/15/1973  . Smokeless tobacco: Never Used  . Alcohol Use: No  . Drug Use: No  . Sexual Activity: No   Other Topics  Concern  . None   Social History Narrative  . None   Family History  Problem Relation Age of Onset  . Heart disease Father   . Heart disease Mother   . Colon cancer Mother    No Known Allergies Prior to Admission medications   Medication Sig Start Date End Date Taking? Authorizing Provider  ALPHA-LIPOIC ACID PO Take 400 mg by mouth daily.    Yes Historical Provider, MD  aspirin EC 81 MG tablet Take 81 mg by mouth daily.   Yes Historical Provider, MD  calcium-vitamin D (OSCAL WITH D) 500-200 MG-UNIT per tablet Take 1 tablet by mouth.   Yes Historical Provider, MD  darbepoetin alfa-polysorbate (ARANESP, ALB FREE, SURECLICK) 500 MCG/ML injection Inject 500 mcg into the skin as needed. Every 2 months   Yes Historical Provider, MD  furosemide (LASIX) 40 MG tablet Take 80 mg by mouth daily.    Yes Historical Provider, MD  lisinopril (PRINIVIL,ZESTRIL) 20 MG tablet Take 10 mg by mouth daily.   Yes Historical Provider, MD  Multiple Vitamins-Minerals (ICAPS PO) Take 2 capsules by mouth daily.    Yes Historical Provider, MD  Multiple Vitamins-Minerals (MULTIVITAMIN  WITH MINERALS) tablet Take 1 tablet by mouth daily.     Yes Historical Provider, MD  Omega-3 Fatty Acids (FISH OIL) 1200 MG CAPS Take 1 capsule by mouth daily.     Yes Historical Provider, MD  omeprazole (PRILOSEC) 20 MG capsule Take 20 mg by mouth daily.    Yes Historical Provider, MD  traMADol (ULTRAM) 50 MG tablet Take 50 mg by mouth every 6 (six) hours as needed for pain.   Yes Historical Provider, MD  vitamin B-12 (CYANOCOBALAMIN) 500 MCG tablet Take 500 mcg by mouth daily.    Yes Historical Provider, MD     Positive ROS: All other systems have been reviewed and were otherwise negative with the exception of those mentioned in the HPI and as above.  Physical Exam: General: Alert, no acute distress Cardiovascular: No pedal edema Respiratory: No cyanosis, no use of accessory musculature GI: No organomegaly, abdomen is soft and  non-tender Skin: No lesions in the area of chief complaint Neurologic: Sensation intact distally Psychiatric: Patient is competent for consent with normal mood and affect Lymphatic: No axillary or cervical lymphadenopathy  MUSCULOSKELETAL: LLE: Anterior medially he has an ulcer which is 7 cm in diameter approximately 1 cm deep with a large hematoma.   Assessment: left leg wound  Plan: Plan for Procedure(s): LEFT LEG IRRIGATION AND DEBRIDEMENT, SKIN GRAFTING  The risks benefits and alternatives were discussed with the patient including but not limited to the risks of nonoperative treatment, versus surgical intervention including infection, bleeding, nerve injury,  blood clots, cardiopulmonary complications, morbidity, mortality, among others, and they were willing to proceed.   Cheral Almas, MD   10/20/2012 11:23 AM

## 2012-10-20 NOTE — Anesthesia Preprocedure Evaluation (Addendum)
Anesthesia Evaluation  Patient identified by MRN, date of birth, ID band Patient awake    Reviewed: Allergy & Precautions, H&P , NPO status , Patient's Chart, lab work & pertinent test results  History of Anesthesia Complications Negative for: history of anesthetic complications  Airway Mallampati: I TM Distance: >3 FB Neck ROM: Full    Dental  (+) Dental Advisory Given, Edentulous Upper and Edentulous Lower   Pulmonary shortness of breath and with exertion, sleep apnea and Continuous Positive Airway Pressure Ventilation , COPDformer smoker,  + rhonchi         Cardiovascular hypertension, Pt. on medications DVT - Peripheral Vascular Disease + dysrhythmias Atrial Fibrillation + pacemaker Rhythm:Regular Rate:Normal  Echo 10/2011 Study Conclusions  - Left ventricle: The cavity size was normal. There was mild  concentric hypertrophy. Systolic function was mildly to  moderately reduced. The estimated ejection fraction was in  the range of 40% to 45%. Severe hypokinesis to akinesis of  the basal-mid inferior myocardium. - Aortic valve: Trivial regurgitation. - Mitral valve: Calcified annulus. Mild regurgitation. - Left atrium: The atrium was mildly dilated. - Atrial septum: No defect or patent foramen ovale was   identified. - Pulmonary arteries: PA peak pressure: 34mm Hg (S).   Neuro/Psych negative neurological ROS  negative psych ROS   GI/Hepatic Neg liver ROS, GERD-  Medicated and Controlled,  Endo/Other  negative endocrine ROS  Renal/GU negative Renal ROS     Musculoskeletal   Abdominal   Peds  Hematology negative hematology ROS (+)   Anesthesia Other Findings   Reproductive/Obstetrics                          Anesthesia Physical  Anesthesia Plan  ASA: III  Anesthesia Plan: General   Post-op Pain Management:    Induction: Intravenous  Airway Management Planned: LMA  Additional  Equipment:   Intra-op Plan:   Post-operative Plan: Extubation in OR  Informed Consent: I have reviewed the patients History and Physical, chart, labs and discussed the procedure including the risks, benefits and alternatives for the proposed anesthesia with the patient or authorized representative who has indicated his/her understanding and acceptance.   Dental advisory given  Plan Discussed with: CRNA  Anesthesia Plan Comments:        Anesthesia Quick Evaluation

## 2012-10-21 DIAGNOSIS — L0291 Cutaneous abscess, unspecified: Secondary | ICD-10-CM | POA: Diagnosis not present

## 2012-10-21 DIAGNOSIS — D649 Anemia, unspecified: Secondary | ICD-10-CM | POA: Diagnosis not present

## 2012-10-21 LAB — BASIC METABOLIC PANEL
BUN: 20 mg/dL (ref 6–23)
Chloride: 104 mEq/L (ref 96–112)
Creatinine, Ser: 0.98 mg/dL (ref 0.50–1.35)
GFR calc Af Amer: 84 mL/min — ABNORMAL LOW (ref >90)
GFR calc non Af Amer: 72 mL/min — ABNORMAL LOW (ref >90)

## 2012-10-21 LAB — CBC
HCT: 26 % — ABNORMAL LOW (ref 39.0–52.0)
MCHC: 33.1 g/dL (ref 30.0–36.0)
MCV: 97.4 fL (ref 78.0–100.0)
RDW: 13.1 % (ref 11.5–15.5)

## 2012-10-21 NOTE — Progress Notes (Deleted)
Transferred to 1522. [name] [name], [employer]

## 2012-10-21 NOTE — Progress Notes (Signed)
ANTIBIOTIC CONSULT NOTE - Follow Up  Pharmacy Consult for vancomycin Indication: cellulitis  No Known Allergies  Patient Measurements: Height: 5\' 9"  (175.3 cm) Weight: 185 lb (83.915 kg) IBW/kg (Calculated) : 70.7  Vital Signs: Temp: 98.3 F (36.8 C) (09/06 0527) Temp src: Oral (09/06 0527) BP: 124/71 mmHg (09/06 0527) Pulse Rate: 64 (09/06 0527) Intake/Output from previous day: 09/05 0701 - 09/06 0700 In: 1200 [P.O.:240; I.V.:960] Out: 735 [Urine:730; Drains:5] Intake/Output from this shift: Total I/O In: 240 [P.O.:240] Out: 175 [Urine:175]  Labs:  Recent Labs  10/19/12 0429 10/20/12 0425 10/21/12 0525  WBC 6.7 5.7 6.1  HGB 9.2* 8.8* 8.6*  PLT 136* 146* 144*  CREATININE 0.95 0.93 0.98   Estimated Creatinine Clearance: 54.1 ml/min (by C-G formula based on Cr of 0.98). No results found for this basename: VANCOTROUGH, Leodis Binet, VANCORANDOM, GENTTROUGH, GENTPEAK, GENTRANDOM, TOBRATROUGH, TOBRAPEAK, TOBRARND, AMIKACINPEAK, AMIKACINTROU, AMIKACIN,  in the last 72 hours   Microbiology: Recent Results (from the past 720 hour(s))  SURGICAL PCR SCREEN     Status: None   Collection Time    10/20/12  9:37 AM      Result Value Range Status   MRSA, PCR NEGATIVE  NEGATIVE Final   Staphylococcus aureus NEGATIVE  NEGATIVE Final   Comment:            The Xpert SA Assay (FDA     approved for NASAL specimens     in patients over 43 years of age),     is one component of     a comprehensive surveillance     program.  Test performance has     been validated by [employer] for patients greater     than or equal to 20 year old.     It is not intended     to diagnose infection nor to     guide or monitor treatment.  CULTURE, EXPECTORATED SPUTUM-ASSESSMENT     Status: None   Collection Time    10/20/12 10:00 AM      Result Value Range Status   Specimen Description SPUTUM   Final   Special Requests Normal   Final   Sputum evaluation     Final   Value: THIS SPECIMEN IS  ACCEPTABLE. RESPIRATORY CULTURE REPORT TO FOLLOW.   Report Status 10/20/2012 FINAL   Final  CULTURE, RESPIRATORY (NON-EXPECTORATED)     Status: None   Collection Time    10/20/12 10:00 AM      Result Value Range Status   Specimen Description SPUTUM   Final   Special Requests NONE   Final   Gram Stain     Final   Value: ABUNDANT WBC PRESENT, PREDOMINANTLY PMN     RARE SQUAMOUS EPITHELIAL CELLS PRESENT     FEW GRAM POSITIVE COCCI IN PAIRS     RARE GRAM NEGATIVE RODS     Performed at [facility]   Culture PENDING   Incomplete   Report Status PENDING   Incomplete    Medical History: Past Medical History  Diagnosis Date  . Hypertension   . Hearing loss   . Pacemaker   . Leg swelling 10/19/2011    LLE  . COPD (chronic obstructive pulmonary disease) 10/19/2011    "touch"  . Exertional dyspnea 10/19/2011  . OSA on CPAP 10/19/2011    "sometimes wear CPAP"  . Prostate cancer     S/P radiation tx ~ 2008  . History of blood transfusion     "  think it was related to pacemaker placement"  . GERD (gastroesophageal reflux disease)   . Arthritis     "back"  . Chronic lower back pain    Assessment: 79 YOM presents with skin infection of LLE, on 8/21 he fell off his riding lawnmower and sustained a large laceration to his LLE requiring stitches.  He was placed on Amoxicillin/clavulonate and presented to PCP for follow-up and found to have worsening wound, started IV vancomycin and ortho consulted.  Ortho performed excision at bedside 9/2 and I&D 9/5.  9/2 >> vancomycin >>  Tmax: 99.5 WBCs: wnl Renal: Scr 0.98, CrCl 63 CG and 54 N  9/5 sputum: pending  Goal of Therapy:  Vancomycin trough level 10-15 mcg/ml  Plan:  Continue 1500 mg IV q24h.  Will check a trough level tomorrow.   Clance Boll, PharmD, BCPS Pager: [phone] 10/21/2012 11:12 AM

## 2012-10-21 NOTE — Progress Notes (Signed)
Transferred to 1522. Sherilee Smotherman  

## 2012-10-21 NOTE — Progress Notes (Signed)
Patient ID: [name] [name], male   DOB: 19-May-1924, 79 y.o.   MRN: [id number]  [facility] PROGRESS NOTE  [name] [name]:[id number] DOB: 09-27-1924 DOA: 10/17/2012 PCP: Ginette Otto, MD  Brief narrative:  Pt is 79 y.o. male with h/o prostate CA, HTN, COPD, fell off his riding lawnmower on 8/21 and sustained a large laceration to anterior medial aspect of left leg, subsequently went to the ER, this was sutured and he was sent home. His PCP started him on Augmentin and after one week of therapy his PCP re-evaluated the wound and sent pt to [facility] ED as wound has not been healing well.   Principal Problem:  Post-traumatic wound infection  - large necrotic wound in the left leg area with large hematoma  - appreciate othro assistance  - continuing daily hydrotherapy and IV Vancomycin  - pt is status post I&D post op day #1, doing well and hemodynamically stable Active Problems:  Acute renal failure  - secondary to pre renal failure, pt has responded well to IVF  - now resolved  - BMP in AM  Anemia of chronic disease  - Hg and Hct stable and at pt's baseline but slightly down this am, possibly post op related  - no signs of active bleed  - CBC in AM  COPD (chronic obstructive pulmonary disease)  - clinically stable and maintaining oxygen saturations at target range, no wheezing on exam  Pacemaker  - stable  Prostate cancer  - stable  Atrial fibrillation  - in sinus rhythm this AM, rate controlled  Cellulitis  - continue Vancomycin IV   Consultants:  Ortho Dr. Lajoyce Corners Procedures/Studies:   PREOPERATIVE DIAGNOSIS: Left leg wound 7 cm x 7 cm : 10/20/2012 Debridement of muscle and/or fascia.  Application of negative pressure wound therapy  Dg Tibia/fibula Left 10/17/2012 No acute findings. No evidence of fracture, osteomyelitis or soft tissue foreign body.  Antibiotics:  Vancomycin 9/2 -->   Code Status: DNR  Family Communication: Pt at bedside  Disposition Plan: Home  when medically stable    HPI/Subjective: No events overnight.   Objective: Filed Vitals:   10/20/12 1706 10/20/12 1800 10/20/12 2108 10/21/12 0527  BP: 92/53 95/53 142/78 124/71  Pulse: 69  73 64  Temp: 97.8 F (36.6 C)  98.4 F (36.9 C) 98.3 F (36.8 C)  TempSrc:   Oral Oral  Resp: 16  16 16   Height:      Weight:      SpO2: 98%  96% 93%    Intake/Output Summary (Last 24 hours) at 10/21/12 0656 Last data filed at 10/21/12 0600  Gross per 24 hour  Intake   1200 ml  Output    735 ml  Net    465 ml    Exam:   General:  Pt is alert, follows commands appropriately, not in acute distress  Cardiovascular: Regular rate and rhythm, S1/S2, no murmurs, no rubs, no gallops  Respiratory: Clear to auscultation bilaterally, no wheezing, no crackles, no rhonchi  Abdomen: Soft, non tender, non distended, bowel sounds present, no guarding  Extremities: No edema, pulses DP and PT palpable bilaterally, wound vac in place on the left LE  Neuro: Grossly nonfocal  Data Reviewed: Basic Metabolic Panel:  Recent Labs Lab 10/17/12 1555 10/18/12 0410 10/19/12 0429 10/20/12 0425 10/21/12 0525  NA 138 136 135 133* 136  K 4.2 4.0 3.7 4.0 4.4  CL 100 102 100 101 104  CO2 30 27 28 28  27  GLUCOSE 121* 100* 102* 102* 102*  BUN 30* 24* 21 22 20   CREATININE 1.45* 1.06 0.95 0.93 0.98  CALCIUM 9.5 8.6 8.8 8.6 8.4   Liver Function Tests:  Recent Labs Lab 10/17/12 1555  AST 21  ALT 11  ALKPHOS 83  BILITOT 0.4  PROT 6.5  ALBUMIN 3.2*   CBC:  Recent Labs Lab 10/17/12 1555 10/18/12 0410 10/19/12 0429 10/20/12 0425 10/21/12 0525  WBC 7.0 5.4 6.7 5.7 6.1  HGB 9.4* 9.0* 9.2* 8.8* 8.6*  HCT 28.4* 27.2* 27.4* 27.0* 26.0*  MCV 97.9 96.5 96.1 96.8 97.4  PLT 145* 143* 136* 146* 144*     Recent Results (from the past 240 hour(s))  SURGICAL PCR SCREEN     Status: None   Collection Time    10/20/12  9:37 AM      Result Value Range Status   MRSA, PCR NEGATIVE  NEGATIVE Final    Staphylococcus aureus NEGATIVE  NEGATIVE Final   Comment:            The Xpert SA Assay (FDA     approved for NASAL specimens     in patients over 18 years of age),     is one component of     a comprehensive surveillance     program.  Test performance has     been validated by [employer] for patients greater     than or equal to 27 year old.     It is not intended     to diagnose infection nor to     guide or monitor treatment.  CULTURE, EXPECTORATED SPUTUM-ASSESSMENT     Status: None   Collection Time    10/20/12 10:00 AM      Result Value Range Status   Specimen Description SPUTUM   Final   Special Requests Normal   Final   Sputum evaluation     Final   Value: THIS SPECIMEN IS ACCEPTABLE. RESPIRATORY CULTURE REPORT TO FOLLOW.   Report Status 10/20/2012 FINAL   Final  CULTURE, RESPIRATORY (NON-EXPECTORATED)     Status: None   Collection Time    10/20/12 10:00 AM      Result Value Range Status   Specimen Description SPUTUM   Final   Special Requests NONE   Final   Gram Stain     Final   Value: ABUNDANT WBC PRESENT, PREDOMINANTLY PMN     RARE SQUAMOUS EPITHELIAL CELLS PRESENT     FEW GRAM POSITIVE COCCI IN PAIRS     RARE GRAM NEGATIVE RODS     Performed at [facility]   Culture PENDING   Incomplete   Report Status PENDING   Incomplete     Scheduled Meds: . aspirin EC  81 mg Oral Daily  . docusate sodium  100 mg Oral BID  . enoxaparin (LOVENOX) injection  40 mg Subcutaneous Q24H  . guaiFENesin  600 mg Oral BID  . lisinopril  10 mg Oral Daily  . multivitamin with minerals  1 tablet Oral Daily  . neomycin-bacitracin-polymyxin   Topical Daily  . pantoprazole  40 mg Oral Daily  . silver sulfADIAZINE   Topical Daily  . vancomycin  1,500 mg Intravenous Q24H   Continuous Infusions: . sodium chloride 20 mL/hr at 10/20/12 1700     Debbora Presto, MD  [facility] Pager [phone]  If 7PM-7AM, please contact night-coverage [email or web address] Password  [email or web address] 10/21/2012, 6:56 AM   LOS: 4 days

## 2012-10-21 NOTE — H&P (Signed)
H&P update  The surgical history has been reviewed and remains accurate without interval change.  The patient was re-examined and patient's physiologic condition has not changed significantly in the last 30 days. The condition still exists that makes this procedure necessary. The treatment plan remains the same, without new options for care.  No new pharmacological allergies or types of therapy has been initiated that would change the plan or the appropriateness of the plan.  The patient and/or family understand the potential benefits and risks.  N. Michael Xu, MD 10/21/2012 9:33 PM   

## 2012-10-22 ENCOUNTER — Encounter (HOSPITAL_COMMUNITY): Payer: Self-pay | Admitting: Anesthesiology

## 2012-10-22 ENCOUNTER — Inpatient Hospital Stay (HOSPITAL_COMMUNITY): Payer: Medicare Other | Admitting: Anesthesiology

## 2012-10-22 ENCOUNTER — Encounter (HOSPITAL_COMMUNITY)
Admission: AD | Disposition: A | Discharge status: Home Health | Payer: Self-pay | Source: Ambulatory Visit | Attending: Internal Medicine

## 2012-10-22 DIAGNOSIS — C61 Malignant neoplasm of prostate: Secondary | ICD-10-CM | POA: Diagnosis not present

## 2012-10-22 DIAGNOSIS — L02419 Cutaneous abscess of limb, unspecified: Secondary | ICD-10-CM | POA: Diagnosis not present

## 2012-10-22 DIAGNOSIS — J449 Chronic obstructive pulmonary disease, unspecified: Secondary | ICD-10-CM | POA: Diagnosis not present

## 2012-10-22 DIAGNOSIS — I4891 Unspecified atrial fibrillation: Secondary | ICD-10-CM | POA: Diagnosis not present

## 2012-10-22 DIAGNOSIS — Y93H2 Activity, gardening and landscaping: Secondary | ICD-10-CM | POA: Diagnosis not present

## 2012-10-22 DIAGNOSIS — L0291 Cutaneous abscess, unspecified: Secondary | ICD-10-CM | POA: Diagnosis not present

## 2012-10-22 DIAGNOSIS — I10 Essential (primary) hypertension: Secondary | ICD-10-CM | POA: Diagnosis not present

## 2012-10-22 DIAGNOSIS — S81009A Unspecified open wound, unspecified knee, initial encounter: Secondary | ICD-10-CM | POA: Diagnosis not present

## 2012-10-22 DIAGNOSIS — N179 Acute kidney failure, unspecified: Secondary | ICD-10-CM | POA: Diagnosis not present

## 2012-10-22 DIAGNOSIS — D649 Anemia, unspecified: Secondary | ICD-10-CM | POA: Diagnosis not present

## 2012-10-22 HISTORY — PX: I & D EXTREMITY: SHX5045

## 2012-10-22 HISTORY — PX: SKIN SPLIT GRAFT: SHX444

## 2012-10-22 LAB — BASIC METABOLIC PANEL
BUN: 17 mg/dL (ref 6–23)
Calcium: 8.4 mg/dL (ref 8.4–10.5)
GFR calc Af Amer: >90 mL/min (ref >90)
GFR calc non Af Amer: 78 mL/min — ABNORMAL LOW (ref >90)
Potassium: 4.2 mEq/L (ref 3.5–5.1)

## 2012-10-22 LAB — CBC
HCT: 26 % — ABNORMAL LOW (ref 39.0–52.0)
MCH: 32 pg (ref 26.0–34.0)
MCHC: 33.1 g/dL (ref 30.0–36.0)
RDW: 12.9 % (ref 11.5–15.5)

## 2012-10-22 SURGERY — IRRIGATION AND DEBRIDEMENT EXTREMITY
Anesthesia: General | Site: Leg Lower | Laterality: Left | Wound class: Clean Contaminated

## 2012-10-22 MED ORDER — LIDOCAINE HCL 1 % IJ SOLN
INTRAMUSCULAR | Status: DC | PRN
  Administered 2012-10-22: 40 mg via INTRADERMAL

## 2012-10-22 MED ORDER — EPHEDRINE SULFATE 50 MG/ML IJ SOLN
INTRAMUSCULAR | Status: DC | PRN
  Administered 2012-10-22: 5 mg via INTRAVENOUS

## 2012-10-22 MED ORDER — ONDANSETRON HCL 4 MG/2ML IJ SOLN
INTRAMUSCULAR | Status: DC | PRN
  Administered 2012-10-22: 4 mg via INTRAVENOUS

## 2012-10-22 MED ORDER — FENTANYL CITRATE 0.05 MG/ML IJ SOLN
INTRAMUSCULAR | Status: DC | PRN
  Administered 2012-10-22: 50 ug via INTRAVENOUS
  Administered 2012-10-22 (×4): 25 ug via INTRAVENOUS

## 2012-10-22 MED ORDER — PROPOFOL 10 MG/ML IV BOLUS
INTRAVENOUS | Status: DC | PRN
  Administered 2012-10-22 (×2): 20 mg via INTRAVENOUS
  Administered 2012-10-22: 100 mg via INTRAVENOUS

## 2012-10-22 MED ORDER — FENTANYL CITRATE 0.05 MG/ML IJ SOLN
INTRAMUSCULAR | Status: AC
  Filled 2012-10-22: qty 2

## 2012-10-22 MED ORDER — PHENYLEPHRINE HCL 10 MG/ML IJ SOLN
INTRAMUSCULAR | Status: DC | PRN
  Administered 2012-10-22: 80 ug via INTRAVENOUS

## 2012-10-22 MED ORDER — SODIUM CHLORIDE 0.9 % IR SOLN
Status: DC | PRN
  Administered 2012-10-22: 3000 mL

## 2012-10-22 MED ORDER — FENTANYL CITRATE 0.05 MG/ML IJ SOLN
25.0000 ug | INTRAMUSCULAR | Status: DC | PRN
  Administered 2012-10-22 (×3): 50 ug via INTRAVENOUS

## 2012-10-22 SURGICAL SUPPLY — 18 items
BNDG COHESIVE 6X5 TAN STRL LF (GAUZE/BANDAGES/DRESSINGS) ×1 IMPLANT
DRAPE EXTREMITY T 121X128X90 (DRAPE) ×1 IMPLANT
DRAPE INCISE IOBAN 66X45 STRL (DRAPES) ×1 IMPLANT
DRSG ADAPTIC 3X8 NADH LF (GAUZE/BANDAGES/DRESSINGS) ×1 IMPLANT
DRSG VAC ATS SM SENSATRAC (GAUZE/BANDAGES/DRESSINGS) ×1 IMPLANT
GLOVE ORTHO TXT STRL SZ7.5 (GLOVE) ×1 IMPLANT
GOWN STRL NON-REIN LRG LVL3 (GOWN DISPOSABLE) ×1 IMPLANT
GOWN STRL REIN XL XLG (GOWN DISPOSABLE) ×2 IMPLANT
GRAFT TISS THERASKIN 2X3 (Tissue) IMPLANT
IV NS IRRIG 3000ML ARTHROMATIC (IV SOLUTION) ×1 IMPLANT
KIT BASIN OR (CUSTOM PROCEDURE TRAY) ×1 IMPLANT
PACK GENERAL/GYN (CUSTOM PROCEDURE TRAY) ×1 IMPLANT
SET IRRIG Y TYPE TUR BLADDER L (SET/KITS/TRAYS/PACK) ×1 IMPLANT
STOCKINETTE 4X48 STRL (DRAPES) ×1 IMPLANT
SUT MON AB 4-0 SH 27 (SUTURE) ×1 IMPLANT
TISSUE THERASKIN 2X3 (Tissue) ×4 IMPLANT
TOWEL NATURAL 10PK STERILE (DISPOSABLE) ×1 IMPLANT
TOWEL OR NON WOVEN STRL DISP B (DISPOSABLE) ×1 IMPLANT

## 2012-10-22 NOTE — Anesthesia Postprocedure Evaluation (Signed)
  Anesthesia Post-op Note  Patient: [name] [name]  Procedure(s) Performed: Procedure(s) (LRB): IRRIGATION AND DEBRIDEMENT EXTREMITY (Left) SKIN GRAFT SPLIT THICKNESS (Left)  Patient Location: PACU  Anesthesia Type: General  Level of Consciousness: awake and alert   Airway and Oxygen Therapy: Patient Spontanous Breathing  Post-op Pain: mild  Post-op Assessment: Post-op Vital signs reviewed, Patient's Cardiovascular Status Stable, Respiratory Function Stable, Patent Airway and No signs of Nausea or vomiting  Last Vitals:  Filed Vitals:   10/22/12 1113  BP: 116/71  Pulse: 66  Temp: 36.7 C  Resp: 16    Post-op Vital Signs: stable   Complications: No apparent anesthesia complications

## 2012-10-22 NOTE — Plan of Care (Signed)
Problem: Phase III Progression Outcomes Goal: IV/normal saline lock discontinued Outcome: Not Applicable Date Met:  10/22/12 PICC for IV ABX Goal: Foley discontinued Outcome: Not Applicable Date Met:  10/22/12 Not present

## 2012-10-22 NOTE — Preoperative (Signed)
Beta Blockers   Reason not to administer Beta Blockers:Not Applicable 

## 2012-10-22 NOTE — Progress Notes (Signed)
Patient ID: [name] [name], male   DOB: 19-Dec-1924, 79 y.o.   MRN: [id number] [facility] PROGRESS NOTE  AMAZIAH RAISANEN WUJ:[id number] DOB: November 25, 1924 DOA: 10/17/2012 PCP: Ginette Otto, MD  Brief narrative:  Pt is 79 y.o. male with h/o prostate CA, HTN, COPD, fell off his riding lawnmower on 8/21 and sustained a large laceration to anterior medial aspect of left leg, subsequently went to the ER, this was sutured and he was sent home. His PCP started him on Augmentin and after one week of therapy his PCP re-evaluated the wound and sent pt to [facility] ED as wound has not been healing well.   Principal Problem:  Post-traumatic wound infection  - large necrotic wound in the left leg area with large hematoma  - appreciate othro assistance  - continuing daily hydrotherapy and IV Vancomycin  - pt is status post I&D post op day #2, doing well and hemodynamically stable  - plan for surgery today  Active Problems:  Acute renal failure  - secondary to pre renal failure, pt has responded well to IVF  - now resolved  - BMP in AM  Anemia of chronic disease  - Hg and Hct stable and at pt's baseline, stable over 24 hours   - no signs of active bleed  - CBC in AM  COPD (chronic obstructive pulmonary disease)  - clinically stable and maintaining oxygen saturations at target range, no wheezing on exam  Pacemaker  - stable  Prostate cancer  - stable  Atrial fibrillation  - in sinus rhythm this AM, rate controlled  Cellulitis  - continue Vancomycin IV   Consultants:  Ortho Procedures/Studies:   PREOPERATIVE DIAGNOSIS: Left leg wound 7 cm x 7 cm : 10/20/2012 Debridement of muscle and/or fascia. Application of negative pressure wound therapy Dg Tibia/fibula Left 10/17/2012 No acute findings. No evidence of fracture, osteomyelitis or soft tissue foreign body.  Antibiotics:  Vancomycin 9/2 -->   Code Status: DNR  Family Communication: Pt at bedside  Disposition Plan: Home when medically  stable   HPI/Subjective: No events overnight.   Objective: Filed Vitals:   10/21/12 0527 10/21/12 1426 10/21/12 2200 10/22/12 0531  BP: 124/71 122/69 145/74 137/76  Pulse: 64 65 64 65  Temp: 98.3 F (36.8 C) 98.5 F (36.9 C) 99.2 F (37.3 C) 98.5 F (36.9 C)  TempSrc: Oral Oral Oral Oral  Resp: 16 16 16 16   Height:      Weight:      SpO2: 93% 99% 99% 96%    Intake/Output Summary (Last 24 hours) at 10/22/12 9562 Last data filed at 10/22/12 0531  Gross per 24 hour  Intake   1380 ml  Output   1225 ml  Net    155 ml    Exam:   General:  Pt is alert, follows commands appropriately, not in acute distress  Cardiovascular: Regular rate and rhythm, S1/S2, no murmurs, no rubs, no gallops  Respiratory: Clear to auscultation bilaterally, no wheezing, no crackles, no rhonchi  Abdomen: Soft, non tender, non distended, bowel sounds present, no guarding  Extremities: pulses DP and PT palpable bilaterally  Neuro: Grossly nonfocal  Data Reviewed: Basic Metabolic Panel:  Recent Labs Lab 10/18/12 0410 10/19/12 0429 10/20/12 0425 10/21/12 0525 10/22/12 0341  NA 136 135 133* 136 137  K 4.0 3.7 4.0 4.4 4.2  CL 102 100 101 104 105  CO2 27 28 28 27 28   GLUCOSE 100* 102* 102* 102* 95  BUN 24* 21 22  20 17  CREATININE 1.06 0.95 0.93 0.98 0.82  CALCIUM 8.6 8.8 8.6 8.4 8.4   Liver Function Tests:  Recent Labs Lab 10/17/12 1555  AST 21  ALT 11  ALKPHOS 83  BILITOT 0.4  PROT 6.5  ALBUMIN 3.2*   CBC:  Recent Labs Lab 10/18/12 0410 10/19/12 0429 10/20/12 0425 10/21/12 0525 10/22/12 0341  WBC 5.4 6.7 5.7 6.1 5.4  HGB 9.0* 9.2* 8.8* 8.6* 8.6*  HCT 27.2* 27.4* 27.0* 26.0* 26.0*  MCV 96.5 96.1 96.8 97.4 96.7  PLT 143* 136* 146* 144* 151     Recent Results (from the past 240 hour(s))  SURGICAL PCR SCREEN     Status: None   Collection Time    10/20/12  9:37 AM      Result Value Range Status   MRSA, PCR NEGATIVE  NEGATIVE Final   Staphylococcus aureus  NEGATIVE  NEGATIVE Final   Comment:            The Xpert SA Assay (FDA     approved for NASAL specimens     in patients over 31 years of age),     is one component of     a comprehensive surveillance     program.  Test performance has     been validated by [employer] for patients greater     than or equal to 43 year old.     It is not intended     to diagnose infection nor to     guide or monitor treatment.  CULTURE, EXPECTORATED SPUTUM-ASSESSMENT     Status: None   Collection Time    10/20/12 10:00 AM      Result Value Range Status   Specimen Description SPUTUM   Final   Special Requests Normal   Final   Sputum evaluation     Final   Value: THIS SPECIMEN IS ACCEPTABLE. RESPIRATORY CULTURE REPORT TO FOLLOW.   Report Status 10/20/2012 FINAL   Final  CULTURE, RESPIRATORY (NON-EXPECTORATED)     Status: None   Collection Time    10/20/12 10:00 AM      Result Value Range Status   Specimen Description SPUTUM   Final   Special Requests NONE   Final   Gram Stain     Final   Value: ABUNDANT WBC PRESENT, PREDOMINANTLY PMN     RARE SQUAMOUS EPITHELIAL CELLS PRESENT     FEW GRAM POSITIVE COCCI IN PAIRS     RARE GRAM NEGATIVE RODS     Performed at [facility]   Culture     Final   Value: Culture reincubated for better growth     Performed at [facility]   Report Status PENDING   Incomplete     Scheduled Meds: . aspirin EC  81 mg Oral Daily  . docusate sodium  100 mg Oral BID  . enoxaparin (LOVENOX) injection  40 mg Subcutaneous Q24H  . guaiFENesin  600 mg Oral BID  . lisinopril  10 mg Oral Daily  . multivitamin with minerals  1 tablet Oral Daily  . neomycin-bacitracin-polymyxin   Topical Daily  . pantoprazole  40 mg Oral Daily  . silver sulfADIAZINE   Topical Daily  . vancomycin  1,500 mg Intravenous Q24H   Continuous Infusions: . sodium chloride 20 mL/hr at 10/20/12 1700     Debbora Presto, MD  [facility] Pager [phone]  If 7PM-7AM, please  contact night-coverage [email or web address] Password [facility] 10/22/2012, 6:52  AM   LOS: 5 days

## 2012-10-22 NOTE — Progress Notes (Signed)
10/22/2012 1145 Placed [employer] wound vac application for Surgeon to complete in room wall unit. Isidoro Donning RN CCM Case Mgmt phone [phone]

## 2012-10-22 NOTE — Progress Notes (Signed)
Eyeglasses returned to patient, he placed on face

## 2012-10-22 NOTE — Brief Op Note (Signed)
Brief Op Note  Date of Surgery: 10/22/2012  Preoperative Diagnosis: Left leg wound  Postoperative Diagnosis: same  Procedure: Procedure(s): IRRIGATION AND DEBRIDEMENT EXTREMITY SKIN GRAFT SPLIT THICKNESS  Implants: none  Surgeons: Surgeon(s): Brixton Franko Glee Arvin, MD  Anesthesia: General  Drains: VAC  Estimated Blood Loss: minimal  Complications: None  Condition to PACU: Stable  Adelbert Gaspard Glee Arvin, MD [facility] 10/22/2012 10:09 AM

## 2012-10-22 NOTE — Transfer of Care (Signed)
Immediate Anesthesia Transfer of Care Note  Patient: [name] [name]  Procedure(s) Performed: Procedure(s): IRRIGATION AND DEBRIDEMENT EXTREMITY (Left) SKIN GRAFT SPLIT THICKNESS (Left)  Patient Location: PACU  Anesthesia Type:General  Level of Consciousness: awake, alert  and oriented  Airway & Oxygen Therapy: Patient Spontanous Breathing and Patient connected to face mask oxygen  Post-op Assessment: Report given to PACU RN and Post -op Vital signs reviewed and stable  Post vital signs: Reviewed and stable  Complications: No apparent anesthesia complications

## 2012-10-22 NOTE — Op Note (Signed)
Date of Surgery: 10/22/2012  INDICATIONS: [name] is a 79 y.o.-year-old male who sustained a left lower leg wound from a lawnmower incident comes back today for serial I&D and possible skin grafting.  The patient did consent to the procedure after discussion of the risks and benefits.   PREOPERATIVE DIAGNOSIS: left lower leg wound 7 cm x 8 cm   POSTOPERATIVE DIAGNOSIS: Same.  PROCEDURE:  1. Surgical preparation of recipient graft site first 100 sq cm. CPT 15002. 2. Application of skin substitute to legs first 25 sq cm. CPT 15271. 3. Application of skin substitute to legs each additional 25 sq cm. CPT 15272 x 2. 4. Application of negative pressure wound therapy greater than 50 sq cm. CPT [phone]  SURGEON: N. Glee Arvin, M.D.  ANESTHESIA: general  IV FLUIDS AND URINE: See anesthesia.  ESTIMATED BLOOD LOSS: minimal mL.  IMPLANTS: None  DRAINS: None.  COMPLICATIONS: None.  DESCRIPTION OF PROCEDURE: The patient was brought to the operating room and placed supine on the operating table.  The patient had been signed prior to the procedure and this was documented. The patient had the anesthesia placed by the anesthesiologist.  The prep verification and incision time-outs were performed to confirm that this was the correct patient, site, side and location. The patient had SCDs in place on the opposite lower extremity. The patient did receive antibiotics prior to the incision and was redosed during the procedure as needed at indicated intervals.  The lower extremity was prepped and draped in the standard fashion.   We began by surgically preparing the recipient site with sharp debridement of the wound bed with a knife.  This was done down to a beefy red bleeding tissue bed.  3 liters of normal saline was irrigated through the wound.  The wound measured 8 cm x 7 cm.   We then turned our attention to the grafting portion.  The appropriately sized cadaveric skin graft was sutured to the wound bed  with 4.0 monocryl.  We then placed a wound VAC on top of this graft.  POSTOPERATIVE PLAN: [name] [name] will remain weight bearing with the leg elevated.  He will need to keep the [facility] on for 5 days at which time, we will remove it and look at the acceptance of the graft.  He may go home with a home vac and return to see me on Friday 10/27/12 if this is more convenient for him.

## 2012-10-22 NOTE — Anesthesia Preprocedure Evaluation (Addendum)
Anesthesia Evaluation  Patient identified by MRN, date of birth, ID band Patient awake    Reviewed: Allergy & Precautions, H&P , NPO status , Patient's Chart, lab work & pertinent test results  Airway Mallampati: II TM Distance: >3 FB Neck ROM: Full    Dental no notable dental hx. (+) Edentulous Upper and Edentulous Lower   Pulmonary shortness of breath and with exertion, sleep apnea and Continuous Positive Airway Pressure Ventilation , COPD States breathing is at baseline and his breathing is rarely a problem. He has had an intermittent productive cough for 3 days which he has been treating with mucinex. He states a sputum sample has been sent for culture. breath sounds clear to auscultation Productive cough. Pulmonary exam normal       Cardiovascular hypertension, Pt. on medications + Peripheral Vascular Disease + dysrhythmias Atrial Fibrillation + pacemaker Rhythm:Regular Rate:Normal  ECHO: 10-24-11: EF 40-45%  ECG: RBBB , atrial paced.   Neuro/Psych negative neurological ROS  negative psych ROS   GI/Hepatic Neg liver ROS, GERD-  Medicated,  Endo/Other  negative endocrine ROS  Renal/GU negative Renal ROS  negative genitourinary   Musculoskeletal negative musculoskeletal ROS (+)   Abdominal   Peds negative pediatric ROS (+)  Hematology negative hematology ROS (+)   Anesthesia Other Findings   Reproductive/Obstetrics negative OB ROS                         Anesthesia Physical Anesthesia Plan  ASA: III  Anesthesia Plan: General   Post-op Pain Management:    Induction: Intravenous  Airway Management Planned: LMA  Additional Equipment:   Intra-op Plan:   Post-operative Plan: Extubation in OR  Informed Consent: I have reviewed the patients History and Physical, chart, labs and discussed the procedure including the risks, benefits and alternatives for the proposed anesthesia with the  patient or authorized representative who has indicated his/her understanding and acceptance.   Dental advisory given  Plan Discussed with: CRNA  Anesthesia Plan Comments: (LMA #5 on 10-20-12 and did fine.)        Anesthesia Quick Evaluation

## 2012-10-23 ENCOUNTER — Encounter (HOSPITAL_COMMUNITY): Payer: Self-pay | Admitting: Orthopaedic Surgery

## 2012-10-23 ENCOUNTER — Inpatient Hospital Stay (HOSPITAL_COMMUNITY): Payer: Medicare Other

## 2012-10-23 DIAGNOSIS — J90 Pleural effusion, not elsewhere classified: Secondary | ICD-10-CM | POA: Diagnosis not present

## 2012-10-23 DIAGNOSIS — L0291 Cutaneous abscess, unspecified: Secondary | ICD-10-CM | POA: Diagnosis not present

## 2012-10-23 DIAGNOSIS — D649 Anemia, unspecified: Secondary | ICD-10-CM | POA: Diagnosis not present

## 2012-10-23 DIAGNOSIS — J438 Other emphysema: Secondary | ICD-10-CM | POA: Diagnosis not present

## 2012-10-23 LAB — CULTURE, RESPIRATORY W GRAM STAIN: Culture: NORMAL

## 2012-10-23 LAB — BASIC METABOLIC PANEL WITH GFR
BUN: 15 mg/dL (ref 6–23)
CO2: 27 meq/L (ref 19–32)
Calcium: 8 mg/dL — ABNORMAL LOW (ref 8.4–10.5)
Chloride: 103 meq/L (ref 96–112)
Creatinine, Ser: 0.81 mg/dL (ref 0.50–1.35)
GFR calc Af Amer: >90 mL/min (ref >90)
GFR calc non Af Amer: 78 mL/min — ABNORMAL LOW (ref >90)
Glucose, Bld: 101 mg/dL — ABNORMAL HIGH (ref 70–99)
Potassium: 4.1 meq/L (ref 3.5–5.1)
Sodium: 134 meq/L — ABNORMAL LOW (ref 135–145)

## 2012-10-23 LAB — CBC
HCT: 24.5 % — ABNORMAL LOW (ref 39.0–52.0)
MCV: 96.5 fL (ref 78.0–100.0)
RBC: 2.54 MIL/uL — ABNORMAL LOW (ref 4.22–5.81)
WBC: 5.8 10*3/uL (ref 4.0–10.5)

## 2012-10-23 LAB — VANCOMYCIN, TROUGH: Vancomycin Tr: 11.5 ug/mL (ref 10.0–20.0)

## 2012-10-23 LAB — PRO B NATRIURETIC PEPTIDE: Pro B Natriuretic peptide (BNP): 1315 pg/mL — ABNORMAL HIGH (ref 0–450)

## 2012-10-23 NOTE — Care Management Note (Signed)
    Page 1 of 2   10/23/2012     1:32:57 PM   CARE MANAGEMENT NOTE 10/23/2012  Patient:  [name] [name], [name] [name]   Account Number:  [id number]  Date Initiated:  10/18/2012  Documentation initiated by:  Colleen Can  Subjective/Objective Assessment:   DX CELLULITIS,  IRRIGATION AND DEBRIDEMENT EXTREMITY  SKIN GRAFT SPLIT THICKNESS     Action/Plan:   cm spoke with patient. Plans to return to his home where he will have supportof spouse. Has RW. [facility].   Anticipated DC Date:  10/24/2012   Anticipated DC Plan:  HOME W HOME HEALTH SERVICES      DC Planning Services  CM consult      [facility] Choice  HOME HEALTH  DURABLE MEDICAL EQUIPMENT   Choice offered to / List presented to:  C-1 Patient   DME arranged  VAC      DME agency  [facility]     HH arranged  HH-1 RN      [facility] agency  [facility]   Status of service:  Completed, signed off Medicare Important Message given?   (If response is "NO", the following Medicare IM given date fields will be blank) Date Medicare IM given:   Date Additional Medicare IM given:    Discharge Disposition:  HOME W HOME HEALTH SERVICES  Per UR Regulation:  Reviewed for med. necessity/level of care/duration of stay  If discussed at Long Length of Stay Meetings, dates discussed:    Comments:  10/23/2012 Colleen Can BSN RN CCM [phone] Per progress notes pt will require home wound vac.  [employer] vac form on shadow chart and MD has completed. Form to be faxed to [phone]. REP-Rickie Toye notified of need for vac to be delivered to hosp if approved.  10/22/2012 1145 Placed [employer] wound vac application for Surgeon to complete in room wall unit. Isidoro Donning RN CCM Case Mgmt phone [phone]

## 2012-10-23 NOTE — Progress Notes (Signed)
Patient ID: [name] [name], male   DOB: 1924/09/07, 79 y.o.   MRN: [id number]  [facility] PROGRESS NOTE  [name] [name]:[id number] DOB: 1924/08/18 DOA: 10/17/2012 PCP: Ginette Otto, MD  Brief narrative:  Pt is 79 y.o. male with h/o prostate CA, HTN, COPD, fell off his riding lawnmower on 8/21 and sustained a large laceration to anterior medial aspect of left leg, subsequently went to the ER, this was sutured and he was sent home. His PCP started him on Augmentin and after one week of therapy his PCP re-evaluated the wound and sent pt to [facility] ED as wound has not been healing well.   Principal Problem:  Post-traumatic wound infection  - large necrotic wound in the left leg area with large hematoma  - appreciate othro assistance  - continuing daily hydrotherapy and IV Vancomycin  - pt is status post I&D post op day #3, doing well and hemodynamically stable  - plan for d/c in AM Active Problems:  Acute renal failure  - secondary to pre renal failure, pt has responded well to IVF  - now resolved  Anemia of chronic disease  - Hg and Hct stable and at pt's baseline, stable over 24 hours  - no signs of active bleed  COPD (chronic obstructive pulmonary disease)  - clinically stable and maintaining oxygen saturations at target range, no wheezing on exam  Pacemaker  - stable  Prostate cancer  - stable  Atrial fibrillation  - in sinus rhythm this AM, rate controlled  Cellulitis  - d/c Vancomycin   Consultants:  Ortho Procedures/Studies:  PREOPERATIVE DIAGNOSIS: Left leg wound 7 cm x 7 cm : 10/20/2012 Debridement of muscle and/or fascia. Application of negative pressure wound therapy Dg Tibia/fibula Left 10/17/2012 No acute findings. No evidence of fracture, osteomyelitis or soft tissue foreign body.  Antibiotics:  Vancomycin 9/2 --> 9/8   Code Status: DNR  Family Communication: Pt at bedside  Disposition Plan: Home when medically stable   HPI/Subjective: No events  overnight.   Objective: Filed Vitals:   10/22/12 2103 10/23/12 0130 10/23/12 0500 10/23/12 1350  BP: 156/73 117/64 100/32 131/51  Pulse: 75 60 61 59  Temp: 99.3 F (37.4 C) 98.7 F (37.1 C) 98 F (36.7 C) 98.7 F (37.1 C)  TempSrc: Oral Oral Oral Oral  Resp: 18 18 16 20   Height:      Weight:      SpO2: 90% 97% 95% 96%    Intake/Output Summary (Last 24 hours) at 10/23/12 1750 Last data filed at 10/23/12 1400  Gross per 24 hour  Intake 895.33 ml  Output    825 ml  Net  70.33 ml    Exam:   General:  Pt is alert, follows commands appropriately, not in acute distress  Cardiovascular: Regular rate and rhythm, S1/S2, no murmurs, no rubs, no gallops  Respiratory: Clear to auscultation bilaterally, no wheezing, no crackles, no rhonchi  Abdomen: Soft, non tender, non distended, bowel sounds present, no guarding  Extremities: No edema, pulses DP and PT palpable bilaterally  Neuro: Grossly nonfocal  Data Reviewed: Basic Metabolic Panel:  Recent Labs Lab 10/19/12 0429 10/20/12 0425 10/21/12 0525 10/22/12 0341 10/23/12 0419  NA 135 133* 136 137 134*  K 3.7 4.0 4.4 4.2 4.1  CL 100 101 104 105 103  CO2 28 28 27 28 27   GLUCOSE 102* 102* 102* 95 101*  BUN 21 22 20 17 15   CREATININE 0.95 0.93 0.98 0.82 0.81  CALCIUM 8.8  8.6 8.4 8.4 8.0*   Liver Function Tests:  Recent Labs Lab 10/17/12 1555  AST 21  ALT 11  ALKPHOS 83  BILITOT 0.4  PROT 6.5  ALBUMIN 3.2*   CBC:  Recent Labs Lab 10/19/12 0429 10/20/12 0425 10/21/12 0525 10/22/12 0341 10/23/12 0419  WBC 6.7 5.7 6.1 5.4 5.8  HGB 9.2* 8.8* 8.6* 8.6* 8.1*  HCT 27.4* 27.0* 26.0* 26.0* 24.5*  MCV 96.1 96.8 97.4 96.7 96.5  PLT 136* 146* 144* 151 153    Recent Results (from the past 240 hour(s))  SURGICAL PCR SCREEN     Status: None   Collection Time    10/20/12  9:37 AM      Result Value Range Status   MRSA, PCR NEGATIVE  NEGATIVE Final   Staphylococcus aureus NEGATIVE  NEGATIVE Final   Comment:             The Xpert SA Assay (FDA     approved for NASAL specimens     in patients over 10 years of age),     is one component of     a comprehensive surveillance     program.  Test performance has     been validated by [employer] for patients greater     than or equal to 22 year old.     It is not intended     to diagnose infection nor to     guide or monitor treatment.  CULTURE, EXPECTORATED SPUTUM-ASSESSMENT     Status: None   Collection Time    10/20/12 10:00 AM      Result Value Range Status   Specimen Description SPUTUM   Final   Special Requests Normal   Final   Sputum evaluation     Final   Value: THIS SPECIMEN IS ACCEPTABLE. RESPIRATORY CULTURE REPORT TO FOLLOW.   Report Status 10/20/2012 FINAL   Final  CULTURE, RESPIRATORY (NON-EXPECTORATED)     Status: None   Collection Time    10/20/12 10:00 AM      Result Value Range Status   Specimen Description SPUTUM   Final   Special Requests NONE   Final   Gram Stain     Final   Value: ABUNDANT WBC PRESENT, PREDOMINANTLY PMN     RARE SQUAMOUS EPITHELIAL CELLS PRESENT     FEW GRAM POSITIVE COCCI IN PAIRS     RARE GRAM NEGATIVE RODS     Performed at [facility]   Culture     Final   Value: NORMAL OROPHARYNGEAL FLORA     Performed at [facility]   Report Status 10/23/2012 FINAL   Final     Scheduled Meds: . aspirin EC  81 mg Oral Daily  . docusate sodium  100 mg Oral BID  . enoxaparin (LOVENOX) injection  40 mg Subcutaneous Q24H  . guaiFENesin  600 mg Oral BID  . lisinopril  10 mg Oral Daily  . multivitamin with minerals  1 tablet Oral Daily  . neomycin-bacitracin-polymyxin   Topical Daily  . pantoprazole  40 mg Oral Daily  . silver sulfADIAZINE   Topical Daily  . vancomycin  1,500 mg Intravenous Q24H   Continuous Infusions: . sodium chloride 20 mL/hr at 10/22/12 1514    Debbora Presto, MD  [facility] Pager [phone]  If 7PM-7AM, please contact night-coverage [email or web address] Password  [email or web address] 10/23/2012, 5:50 PM   LOS: 6 days

## 2012-10-23 NOTE — Progress Notes (Signed)
10/23/2012 Colleen Can BSN RN CCm [phone] Received call from [facility] at [facility] that doctor that filled out form is not a memeber of [place] system. States [employer] would not be able to approve for equipment based on medicare guidelines because MD not in [place] system. States another doctor that was in [place] would need to sign. TCT Dr Warren Danes office; spoke with Marchelle Folks who advised that Dr Ophelia Charter was in [place] system but has already left office and would not be able to sign papers till tomorrow. Papers faxed to [facility] office at [phone] with confirmation. Romie Minus -assistant to Dr Roda Shutters regarding whether order for discharge would be cancelled d/t pending nppwt approval. She trasferred me to office manager who stated that Dr Magnus Ivan could sign forms and fax them back to hospital & states Md will not be cancelling discharge order at this time. Forms received and refaxed to [facility] at [phone] received. Spoke with Lafonda Mosses at [facility] regarding refax of information. States case will be reviewed. Can call to check on status  at [phone] ext [phone] ext-for customer service. Order # [id number]. This information was given to charge nurse.

## 2012-10-23 NOTE — Progress Notes (Signed)
Subjective:  Patient reports pain as none.  Very happy skin grafting was done.  Objective:   VITALS:   Filed Vitals:   10/22/12 2103 10/23/12 0130 10/23/12 0500 10/23/12 1350  BP: 156/73 117/64 100/32 131/51  Pulse: 75 60 61 59  Temp: 99.3 F (37.4 C) 98.7 F (37.1 C) 98 F (36.7 C) 98.7 F (37.1 C)  TempSrc: Oral Oral Oral Oral  Resp: 18 18 16 20   Height:      Weight:      SpO2: 90% 97% 95% 96%    Neurologically intact Neurovascular intact Sensation intact distally Intact pulses distally Dorsiflexion/Plantar flexion intact Incision: dressing C/D/I No cellulitis present Compartment soft   Lab Results  Component Value Date   WBC 5.8 10/23/2012   HGB 8.1* 10/23/2012   HCT 24.5* 10/23/2012   MCV 96.5 10/23/2012   PLT 153 10/23/2012     Assessment/Plan: 1 Day Post-Op   Problem List Items Addressed This Visit   Anemia   Relevant Orders      Increase activity slowly      Diet - low sodium heart healthy   COPD (chronic obstructive pulmonary disease)   Relevant Medications      guaiFENesin (MUCINEX) 12 hr tablet 600 mg   Other Relevant Orders      Increase activity slowly      Diet - low sodium heart healthy   Pacemaker   Relevant Orders      Increase activity slowly      Diet - low sodium heart healthy   Prostate cancer   Relevant Medications      traMADol (ULTRAM) 50 MG tablet      traMADol (ULTRAM) tablet 50 mg      aspirin EC tablet 81 mg      acetaminophen (TYLENOL) tablet 650 mg   Other Relevant Orders      Increase activity slowly      Diet - low sodium heart healthy   Atrial fibrillation   Relevant Medications      lisinopril (PRINIVIL,ZESTRIL) tablet 10 mg      aspirin EC tablet 81 mg      enoxaparin (LOVENOX) injection 40 mg   Other Relevant Orders      Increase activity slowly      Diet - low sodium heart healthy   Cellulitis - Primary   Relevant Orders      Increase activity slowly      Diet - low sodium heart healthy     May d/c home with  home vac and f/u on Friday in clinic to look at graft. Advance diet Up with therapy Pain control Discharge planning   Cheral Almas 10/23/2012, 9:17 PM [phone]

## 2012-10-23 NOTE — Progress Notes (Signed)
ANTIBIOTIC CONSULT NOTE - Follow Up  Pharmacy Consult for vancomycin Indication: cellulitis  No Known Allergies  Patient Measurements: Height: 5\' 9"  (175.3 cm) Weight: 185 lb (83.915 kg) IBW/kg (Calculated) : 70.7  Vital Signs: Temp: 98 F (36.7 C) (09/08 0500) Temp src: Oral (09/08 0500) BP: 100/32 mmHg (09/08 0500) Pulse Rate: 61 (09/08 0500) Intake/Output from previous day: 09/07 0701 - 09/08 0700 In: 1521 [P.O.:240; I.V.:781; IV Piggyback:500] Out: 575 [Urine:575] Intake/Output from this shift: Total I/O In: 320 [P.O.:240; I.V.:80] Out: 250 [Urine:250]  Labs:  Recent Labs  10/21/12 0525 10/22/12 0341 10/23/12 0419  WBC 6.1 5.4 5.8  HGB 8.6* 8.6* 8.1*  PLT 144* 151 153  CREATININE 0.98 0.82 0.81   Estimated Creatinine Clearance: 65.5 ml/min (by C-G formula based on Cr of 0.81).  Recent Labs  10/23/12 1100  VANCOTROUGH 11.5     Microbiology: Recent Results (from the past 720 hour(s))  SURGICAL PCR SCREEN     Status: None   Collection Time    10/20/12  9:37 AM      Result Value Range Status   MRSA, PCR NEGATIVE  NEGATIVE Final   Staphylococcus aureus NEGATIVE  NEGATIVE Final   Comment:            The Xpert SA Assay (FDA     approved for NASAL specimens     in patients over 77 years of age),     is one component of     a comprehensive surveillance     program.  Test performance has     been validated by [employer] for patients greater     than or equal to 27 year old.     It is not intended     to diagnose infection nor to     guide or monitor treatment.  CULTURE, EXPECTORATED SPUTUM-ASSESSMENT     Status: None   Collection Time    10/20/12 10:00 AM      Result Value Range Status   Specimen Description SPUTUM   Final   Special Requests Normal   Final   Sputum evaluation     Final   Value: THIS SPECIMEN IS ACCEPTABLE. RESPIRATORY CULTURE REPORT TO FOLLOW.   Report Status 10/20/2012 FINAL   Final  CULTURE, RESPIRATORY (NON-EXPECTORATED)      Status: None   Collection Time    10/20/12 10:00 AM      Result Value Range Status   Specimen Description SPUTUM   Final   Special Requests NONE   Final   Gram Stain     Final   Value: ABUNDANT WBC PRESENT, PREDOMINANTLY PMN     RARE SQUAMOUS EPITHELIAL CELLS PRESENT     FEW GRAM POSITIVE COCCI IN PAIRS     RARE GRAM NEGATIVE RODS     Performed at [facility]   Culture     Final   Value: NORMAL OROPHARYNGEAL FLORA     Performed at [facility]   Report Status 10/23/2012 FINAL   Final   Assessment: [name] [name] presents with skin infection of LLE, on 8/21 he fell off his riding lawnmower and sustained a large laceration to his LLE requiring stitches.  He was placed on Amoxicillin/clavulanate and presented to PCP for follow-up and found to have worsening wound, started IV vancomycin and ortho consulted.  Ortho performed excision at bedside 9/2 and I&D 9/5.  9/2 >> Vancomycin >>  Tmax: 99.3 WBCs: wnl Renal: Scr 0.81,  CrCl 66 CG and 66 N  9/5 sputum cx: normal flora.   Vanc trough is within target range.  Goal of Therapy:  Vancomycin trough level 10-15 mcg/ml  Plan:  Continue Vancomycin 1500mg  IV q24h. Measure Vanc trough at steady state. Follow up renal fxn and culture results.  Charolotte Eke, PharmD, pager [phone]. 10/23/2012,12:38 PM.

## 2012-10-24 DIAGNOSIS — I4891 Unspecified atrial fibrillation: Secondary | ICD-10-CM | POA: Diagnosis not present

## 2012-10-24 DIAGNOSIS — L0291 Cutaneous abscess, unspecified: Secondary | ICD-10-CM | POA: Diagnosis not present

## 2012-10-24 DIAGNOSIS — J449 Chronic obstructive pulmonary disease, unspecified: Secondary | ICD-10-CM | POA: Diagnosis not present

## 2012-10-24 DIAGNOSIS — D649 Anemia, unspecified: Secondary | ICD-10-CM | POA: Diagnosis not present

## 2012-10-24 MED ORDER — FUROSEMIDE 10 MG/ML IJ SOLN
40.0000 mg | Freq: Once | INTRAMUSCULAR | Status: AC
  Administered 2012-10-24: 40 mg via INTRAVENOUS
  Filled 2012-10-24: qty 4

## 2012-10-24 NOTE — Progress Notes (Signed)
Patient complained of being short of breath. Checked vitals sats 95%, replaced 02. Notified MD. Orders obtained. Will continue to monitor.

## 2012-10-24 NOTE — Progress Notes (Signed)
10/24/2012 Colleen Can BSN RN CCM [phone] Tct customer service at [facility]  with [employer] who advised authorization is pending review. Tct Diana[facility]in [employer] to advise me on decision made after review completed. Received call 11:55 from [facility]; advised that authorization has been received to release wound vac and that delivery will occur today by 1PM. Assigned CM advised of above and will advise patient.

## 2012-10-24 NOTE — Discharge Summary (Signed)
Physician Discharge Summary  [name] [name] ZOX:[id number] DOB: 04-Jul-1924 DOA: 10/17/2012  PCP: Ginette Otto, MD  Admit date: 10/17/2012 Discharge date: 10/24/2012  Recommendations for Outpatient Follow-up:  1. Pt will need to follow up with PCP in 2-3 weeks post discharge 2. Please obtain BMP to evaluate electrolytes and kidney function 3. Please also check CBC to evaluate Hg and Hct levels 4. Please note that pt has an appointment with ortho specialist on this coming Friday Sept 12, 2014 for evaluation of wound vac 5. Please note that pt was set up with home health PT, aid and nurse for assistance with wound vac  6. Per ortho recommendations, no need for outpt antibiotics as pt has completed therapy inpatient   Discharge Diagnoses:  Principal Problem:   Post-traumatic wound infection Active Problems:   COPD (chronic obstructive pulmonary disease)   Pacemaker   Prostate cancer   Atrial fibrillation   Cellulitis  Discharge Condition: Stable  Diet recommendation: Heart healthy diet discussed in details   Brief narrative:  Pt is 79 y.o. male with h/o prostate CA, HTN, COPD, fell off his riding lawnmower on 8/21 and sustained a large laceration to anterior medial aspect of left leg, subsequently went to the ER, this was sutured and he was sent home. His PCP started him on Augmentin and after one week of therapy his PCP re-evaluated the wound and sent pt to [facility] ED as wound has not been healing well.   Principal Problem:  Post-traumatic wound infection  - large necrotic wound in the left leg area with large hematoma  - appreciate othro assistance  - continuing daily hydrotherapy and IV Vancomycin, will stop Vancomycin today as no need for continuation of ABX, will place order for PICC line removal as well  - pt is status post I&D post op day #4, doing well and hemodynamically stable  - plan for d/c today with ortho follow up Sept 12th, 2014 Active Problems:  Acute renal  failure  - secondary to pre renal failure, pt has responded well to IVF  - now resolved  Anemia of chronic disease  - Hg and Hct stable and at pt's baseline, stable over 24 hours  - no signs of active bleed  COPD (chronic obstructive pulmonary disease)  - clinically stable and maintaining oxygen saturations at target range, no wheezing on exam  Pacemaker  - stable  Prostate cancer  - stable  Atrial fibrillation  - in sinus rhythm this AM, rate controlled  Cellulitis  - d/c Vancomycin   Consultants:  Ortho Procedures/Studies:  PREOPERATIVE DIAGNOSIS: Left leg wound 7 cm x 7 cm : 10/20/2012 Debridement of muscle and/or fascia. Application of negative pressure wound therapy  Dg Tibia/fibula Left 10/17/2012 No acute findings. No evidence of fracture, osteomyelitis or soft tissue foreign body.  Antibiotics:  Vancomycin 9/2 --> 9/8  Code Status: DNR  Family Communication: Pt at bedside    Discharge Exam: Filed Vitals:   10/24/12 0930  BP: 145/75  Pulse: 65  Temp: 97.5 F (36.4 C)  Resp: 20   Filed Vitals:   10/23/12 1350 10/23/12 2156 10/24/12 0654 10/24/12 0930  BP: 131/51 132/73 151/77 145/75  Pulse: 59 65 62 65  Temp: 98.7 F (37.1 C) 98.3 F (36.8 C) 98.7 F (37.1 C) 97.5 F (36.4 C)  TempSrc: Oral Oral Oral Oral  Resp: 20 18 18 20   Height:      Weight:      SpO2: 96% 94% 94% 92%  General: Pt is alert, follows commands appropriately, not in acute distress Cardiovascular: Regular rate and rhythm, S1/S2 +, no murmurs, no rubs, no gallops Respiratory: Clear to auscultation bilaterally, no wheezing, no crackles, no rhonchi Abdominal: Soft, non tender, non distended, bowel sounds +, no guarding Extremities: no edema, no cyanosis, pulses palpable bilaterally DP and PT Neuro: Grossly nonfocal  Discharge Instructions  Discharge Orders   Future Appointments Provider Department Dept Phone   10/31/2012 10:15 AM Krista Blue [facility] [phone]   10/31/2012 10:45 AM [facility] [place] [facility] [phone]   12/26/2012 10:00 AM [facility] [place] [facility] [phone]   12/26/2012 10:30 AM [facility] Nurse [place] [facility] [phone]   02/20/2013 10:15 AM Krista Blue [facility] [phone]   02/20/2013 10:45 AM Myrtis Ser, NP [place] [facility] [phone]   02/20/2013 11:15 AM [facility] [place] [facility] [phone]   Future Orders Complete By Expires   Diet - low sodium heart healthy  As directed    Diet - low sodium heart healthy  As directed    Increase activity slowly  As directed    Increase activity slowly  As directed        Medication List         ALPHA-LIPOIC ACID PO  Take 400 mg by mouth daily.     ARANESP (ALB FREE) SURECLICK 500 MCG/ML injection  Generic drug:  darbepoetin alfa-polysorbate  Inject 500 mcg into the skin as needed. Every 2 months     aspirin EC 81 MG tablet  Take 81 mg by mouth daily.     calcium-vitamin D 500-200 MG-UNIT per tablet  Commonly known as:  OSCAL WITH D  Take 1 tablet by mouth.     Fish Oil 1200 MG Caps  Take 1 capsule by mouth daily.     furosemide 40 MG tablet  Commonly known as:  LASIX  Take 80 mg by mouth daily.     ICAPS PO  Take 2 capsules by mouth daily.     multivitamin with minerals tablet  Take 1 tablet by mouth daily.     lisinopril 20 MG tablet  Commonly known as:  PRINIVIL,ZESTRIL  Take 10 mg by mouth daily.     omeprazole 20 MG capsule  Commonly known as:  PRILOSEC  Take 20 mg by mouth daily.     traMADol 50 MG tablet  Commonly known as:  ULTRAM  Take 50 mg by mouth every 6 (six) hours as needed for pain.     vitamin B-12 500 MCG tablet  Commonly known as:  CYANOCOBALAMIN  Take 500 mcg by mouth daily.            Follow-up Information   Follow up with Cheral Almas, MD On 10/27/2012.   Specialty:  Orthopedic Surgery   Contact information:   [address] [place] [address] [phone]       Follow up with Ginette Otto, MD In 2 weeks.   Specialty:  Internal Medicine   Contact information:   [address] [place] [address] [phone]       Follow up with Debbora Presto, MD. (call my cell phone with any questions [phone])    Specialty:  Internal Medicine   Contact information:   [address] [employer]  [place] [address] [phone]        The results of significant diagnostics from this hospitalization (including imaging, microbiology, ancillary and laboratory) are listed below for reference.     Microbiology: Recent Results (from the past 240 hour(s))  SURGICAL PCR SCREEN     Status: None   Collection Time    10/20/12  9:37 AM      Result Value Range Status   MRSA, PCR NEGATIVE  NEGATIVE Final   Staphylococcus aureus NEGATIVE  NEGATIVE Final   Comment:            The Xpert SA Assay (FDA     approved for NASAL specimens     in patients over 63 years of age),     is one component of     a comprehensive surveillance     program.  Test performance has     been validated by [employer] for patients greater     than or equal to 65 year old.     It is not intended     to diagnose infection nor to     guide or monitor treatment.  CULTURE, EXPECTORATED SPUTUM-ASSESSMENT     Status: None   Collection Time    10/20/12 10:00 AM      Result Value Range Status   Specimen Description SPUTUM   Final   Special Requests Normal   Final   Sputum evaluation     Final   Value: THIS SPECIMEN IS ACCEPTABLE. RESPIRATORY CULTURE REPORT TO FOLLOW.   Report Status 10/20/2012 FINAL   Final  CULTURE, RESPIRATORY (NON-EXPECTORATED)     Status: None   Collection Time    10/20/12 10:00 AM      Result Value Range Status   Specimen  Description SPUTUM   Final   Special Requests NONE   Final   Gram Stain     Final   Value: ABUNDANT WBC PRESENT, PREDOMINANTLY PMN     RARE SQUAMOUS EPITHELIAL CELLS PRESENT     FEW GRAM POSITIVE COCCI IN PAIRS     RARE GRAM NEGATIVE RODS     Performed at [facility]   Culture     Final   Value: NORMAL OROPHARYNGEAL FLORA     Performed at [facility]   Report Status 10/23/2012 FINAL   Final     Labs: Basic Metabolic Panel:  Recent Labs Lab 10/19/12 0429 10/20/12 0425 10/21/12 0525 10/22/12 0341 10/23/12 0419  NA 135 133* 136 137 134*  K 3.7 4.0 4.4 4.2 4.1  CL 100 101 104 105 103  CO2 28 28 27 28 27   GLUCOSE 102* 102* 102* 95 101*  BUN 21 22 20 17 15   CREATININE 0.95 0.93 0.98 0.82 0.81  CALCIUM 8.8 8.6 8.4 8.4 8.0*   Liver Function Tests:  Recent Labs Lab 10/17/12 1555  AST 21  ALT 11  ALKPHOS 83  BILITOT 0.4  PROT 6.5  ALBUMIN 3.2*   CBC:  Recent Labs Lab 10/19/12 0429 10/20/12 0425 10/21/12 0525 10/22/12 0341 10/23/12 0419  WBC 6.7 5.7 6.1 5.4 5.8  HGB 9.2* 8.8* 8.6* 8.6* 8.1*  HCT 27.4* 27.0* 26.0* 26.0* 24.5*  MCV 96.1 96.8 97.4 96.7 96.5  PLT 136* 146* 144* 151 153    BNP (last 3 results)  Recent Labs  10/23/12 0419  PROBNP 1315.0*    SIGNED: Time coordinating discharge: Over 30 minutes  MAGICK-Amora Sheehy, MD  [facility]  10/24/2012, 10:50 AM Pager [phone]  If 7PM-7AM, please contact night-coverage [email or web address] Password [email or web address]

## 2012-10-24 NOTE — Progress Notes (Signed)
Subjective:  Patient reports pain as none.   Objective:   VITALS:   Filed Vitals:   10/23/12 0500 10/23/12 1350 10/23/12 2156 10/24/12 0654  BP: 100/32 131/51 132/73 151/77  Pulse: 61 59 65 62  Temp: 98 F (36.7 C) 98.7 F (37.1 C) 98.3 F (36.8 C) 98.7 F (37.1 C)  TempSrc: Oral Oral Oral Oral  Resp: 16 20 18 18   Height:      Weight:      SpO2: 95% 96% 94% 94%    Neurologically intact Neurovascular intact Sensation intact distally Intact pulses distally Dorsiflexion/Plantar flexion intact Incision: dressing C/D/I No cellulitis present Compartment soft VAC with good seal and suction   Lab Results  Component Value Date   WBC 5.8 10/23/2012   HGB 8.1* 10/23/2012   HCT 24.5* 10/23/2012   MCV 96.5 10/23/2012   PLT 153 10/23/2012     Assessment/Plan: 2 Days Post-Op   Problem List Items Addressed This Visit   Anemia   Relevant Orders      Increase activity slowly      Diet - low sodium heart healthy   COPD (chronic obstructive pulmonary disease)   Relevant Medications      guaiFENesin (MUCINEX) 12 hr tablet 600 mg   Other Relevant Orders      Increase activity slowly      Diet - low sodium heart healthy   Pacemaker   Relevant Orders      Increase activity slowly      Diet - low sodium heart healthy   Prostate cancer   Relevant Medications      traMADol (ULTRAM) 50 MG tablet      traMADol (ULTRAM) tablet 50 mg      aspirin EC tablet 81 mg      acetaminophen (TYLENOL) tablet 650 mg   Other Relevant Orders      Increase activity slowly      Diet - low sodium heart healthy   Atrial fibrillation   Relevant Medications      lisinopril (PRINIVIL,ZESTRIL) tablet 10 mg      aspirin EC tablet 81 mg      enoxaparin (LOVENOX) injection 40 mg   Other Relevant Orders      Increase activity slowly      Diet - low sodium heart healthy   Cellulitis - Primary   Relevant Orders      Increase activity slowly      Diet - low sodium heart healthy     Plan to d/c home  with home vac and f/u on Friday in clinic to look at graft. Advance diet Up with therapy Pain control Discharge planning   Cheral Almas 10/24/2012, 8:23 AM [phone]

## 2012-10-25 DIAGNOSIS — C61 Malignant neoplasm of prostate: Secondary | ICD-10-CM | POA: Diagnosis not present

## 2012-10-25 DIAGNOSIS — Z4800 Encounter for change or removal of nonsurgical wound dressing: Secondary | ICD-10-CM | POA: Diagnosis not present

## 2012-10-25 DIAGNOSIS — S51009A Unspecified open wound of unspecified elbow, initial encounter: Secondary | ICD-10-CM | POA: Diagnosis not present

## 2012-10-25 DIAGNOSIS — I10 Essential (primary) hypertension: Secondary | ICD-10-CM | POA: Diagnosis not present

## 2012-10-25 DIAGNOSIS — I4891 Unspecified atrial fibrillation: Secondary | ICD-10-CM | POA: Diagnosis not present

## 2012-10-25 DIAGNOSIS — J449 Chronic obstructive pulmonary disease, unspecified: Secondary | ICD-10-CM | POA: Diagnosis not present

## 2012-10-25 DIAGNOSIS — D638 Anemia in other chronic diseases classified elsewhere: Secondary | ICD-10-CM | POA: Diagnosis not present

## 2012-10-25 DIAGNOSIS — Z45018 Encounter for adjustment and management of other part of cardiac pacemaker: Secondary | ICD-10-CM | POA: Diagnosis not present

## 2012-10-25 DIAGNOSIS — L02419 Cutaneous abscess of limb, unspecified: Secondary | ICD-10-CM | POA: Diagnosis not present

## 2012-10-25 DIAGNOSIS — S81009A Unspecified open wound, unspecified knee, initial encounter: Secondary | ICD-10-CM | POA: Diagnosis not present

## 2012-10-26 DIAGNOSIS — R609 Edema, unspecified: Secondary | ICD-10-CM | POA: Diagnosis not present

## 2012-10-26 DIAGNOSIS — R0602 Shortness of breath: Secondary | ICD-10-CM | POA: Diagnosis not present

## 2012-10-26 DIAGNOSIS — I10 Essential (primary) hypertension: Secondary | ICD-10-CM | POA: Diagnosis not present

## 2012-10-30 DIAGNOSIS — Z79899 Other long term (current) drug therapy: Secondary | ICD-10-CM | POA: Diagnosis not present

## 2012-10-30 DIAGNOSIS — J449 Chronic obstructive pulmonary disease, unspecified: Secondary | ICD-10-CM | POA: Diagnosis not present

## 2012-10-30 DIAGNOSIS — R609 Edema, unspecified: Secondary | ICD-10-CM | POA: Diagnosis not present

## 2012-10-31 ENCOUNTER — Other Ambulatory Visit (HOSPITAL_BASED_OUTPATIENT_CLINIC_OR_DEPARTMENT_OTHER): Payer: Medicare Other | Admitting: Lab

## 2012-10-31 ENCOUNTER — Ambulatory Visit (HOSPITAL_BASED_OUTPATIENT_CLINIC_OR_DEPARTMENT_OTHER): Payer: Medicare Other

## 2012-10-31 VITALS — BP 140/62 | HR 86 | Temp 98.3°F

## 2012-10-31 DIAGNOSIS — D649 Anemia, unspecified: Secondary | ICD-10-CM

## 2012-10-31 DIAGNOSIS — J449 Chronic obstructive pulmonary disease, unspecified: Secondary | ICD-10-CM | POA: Diagnosis not present

## 2012-10-31 DIAGNOSIS — N289 Disorder of kidney and ureter, unspecified: Secondary | ICD-10-CM

## 2012-10-31 DIAGNOSIS — I10 Essential (primary) hypertension: Secondary | ICD-10-CM | POA: Diagnosis not present

## 2012-10-31 DIAGNOSIS — S81009A Unspecified open wound, unspecified knee, initial encounter: Secondary | ICD-10-CM | POA: Diagnosis not present

## 2012-10-31 DIAGNOSIS — S51009A Unspecified open wound of unspecified elbow, initial encounter: Secondary | ICD-10-CM | POA: Diagnosis not present

## 2012-10-31 DIAGNOSIS — K432 Incisional hernia without obstruction or gangrene: Secondary | ICD-10-CM

## 2012-10-31 DIAGNOSIS — D638 Anemia in other chronic diseases classified elsewhere: Secondary | ICD-10-CM | POA: Diagnosis not present

## 2012-10-31 DIAGNOSIS — C61 Malignant neoplasm of prostate: Secondary | ICD-10-CM | POA: Diagnosis not present

## 2012-10-31 LAB — CBC WITH DIFFERENTIAL/PLATELET
Basophils Absolute: 0 10*3/uL (ref 0.0–0.1)
Eosinophils Absolute: 0.2 10*3/uL (ref 0.0–0.5)
HGB: 10.4 g/dL — ABNORMAL LOW (ref 13.0–17.1)
LYMPH%: 15.9 % (ref 14.0–49.0)
MCV: 94.7 fL (ref 79.3–98.0)
MONO%: 6.4 % (ref 0.0–14.0)
NEUT#: 5.4 10*3/uL (ref 1.5–6.5)
Platelets: 238 10*3/uL (ref 140–400)
RDW: 13.3 % (ref 11.0–14.6)

## 2012-10-31 MED ORDER — DARBEPOETIN ALFA-POLYSORBATE 300 MCG/0.6ML IJ SOLN
300.0000 ug | Freq: Once | INTRAMUSCULAR | Status: AC
  Administered 2012-10-31: 300 ug via SUBCUTANEOUS
  Filled 2012-10-31: qty 0.6

## 2012-11-01 ENCOUNTER — Ambulatory Visit (INDEPENDENT_AMBULATORY_CARE_PROVIDER_SITE_OTHER): Payer: Medicare Other | Admitting: Pulmonary Disease

## 2012-11-01 ENCOUNTER — Encounter: Payer: Self-pay | Admitting: Pulmonary Disease

## 2012-11-01 VITALS — BP 104/56 | HR 75 | Temp 97.8°F | Ht 69.0 in | Wt 181.6 lb

## 2012-11-01 DIAGNOSIS — J449 Chronic obstructive pulmonary disease, unspecified: Secondary | ICD-10-CM | POA: Diagnosis not present

## 2012-11-01 NOTE — Assessment & Plan Note (Signed)
The patient has known moderate to severe airflow obstruction, and he has lost significant flow since his studies in January 2013.  I think it is very important that we try him again on a maintenance bronchodilator regimen, and will give him samples of anoro to try.  I have also stressed the importance of a conditioning program as well.  I think we also need to keep in mind that his anemia plays a role in his doe as well.

## 2012-11-01 NOTE — Progress Notes (Signed)
  Subjective:    Patient ID: [name] [name], male    DOB: 1924/07/24, 79 y.o.   MRN: [id number]  HPI The pt comes in today for f/u of his doe.  He has known moderate copd, felt secondary to emphysema.  I have not seen him in 1 1/2 years, and he comes in today where he has noted increased exertional dyspnea above baseline.  He has been tried on spiriva and symbicort in the past, with no change.  He denies any chest congestion, but has had a rattling cough without significant mucus.  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     Review of Systems  Constitutional: Negative for fever and unexpected weight change.  HENT: Positive for rhinorrhea. Negative for ear pain, nosebleeds, congestion, sore throat, sneezing, trouble swallowing, dental problem, postnasal drip and sinus pressure.   Eyes: Negative for redness and itching.  Respiratory: Positive for cough, shortness of breath and wheezing. Negative for chest tightness.   Cardiovascular: Positive for leg swelling. Negative for palpitations.  Gastrointestinal: Negative for nausea and vomiting.  Genitourinary: Negative for dysuria.  Musculoskeletal: Negative for joint swelling.  Skin: Negative for rash.  Neurological: Negative for headaches.  Hematological: Bruises/bleeds easily.  Psychiatric/Behavioral: Negative for dysphoric mood. The patient is not nervous/anxious.        Objective:   Physical Exam Wd male in nad Nose without purulence or d/c noted. OP clear. Neck without LN or TMG Chest with decreased bs, but adequate airflow.  No wheezing Cor with rrr LE with 1+ edema, left > right, no cyanosis Alert and oriented, moves all 4.        Assessment & Plan:

## 2012-11-01 NOTE — Patient Instructions (Addendum)
Will try Anoro one inhalation each am.  Rinse mouth well. Can use your albuterol for rescue if needed during activity.  Work on conditioning and endurance. Would like to see you back in 4 weeks to check on your progress.

## 2012-11-02 DIAGNOSIS — J449 Chronic obstructive pulmonary disease, unspecified: Secondary | ICD-10-CM | POA: Diagnosis not present

## 2012-11-02 DIAGNOSIS — D638 Anemia in other chronic diseases classified elsewhere: Secondary | ICD-10-CM | POA: Diagnosis not present

## 2012-11-02 DIAGNOSIS — S81009A Unspecified open wound, unspecified knee, initial encounter: Secondary | ICD-10-CM | POA: Diagnosis not present

## 2012-11-02 DIAGNOSIS — I10 Essential (primary) hypertension: Secondary | ICD-10-CM | POA: Diagnosis not present

## 2012-11-02 DIAGNOSIS — C61 Malignant neoplasm of prostate: Secondary | ICD-10-CM | POA: Diagnosis not present

## 2012-11-02 DIAGNOSIS — S51009A Unspecified open wound of unspecified elbow, initial encounter: Secondary | ICD-10-CM | POA: Diagnosis not present

## 2012-11-03 DIAGNOSIS — H6120 Impacted cerumen, unspecified ear: Secondary | ICD-10-CM | POA: Diagnosis not present

## 2012-11-03 DIAGNOSIS — H902 Conductive hearing loss, unspecified: Secondary | ICD-10-CM | POA: Diagnosis not present

## 2012-11-06 DIAGNOSIS — S51009A Unspecified open wound of unspecified elbow, initial encounter: Secondary | ICD-10-CM | POA: Diagnosis not present

## 2012-11-06 DIAGNOSIS — S81009A Unspecified open wound, unspecified knee, initial encounter: Secondary | ICD-10-CM | POA: Diagnosis not present

## 2012-11-06 DIAGNOSIS — D638 Anemia in other chronic diseases classified elsewhere: Secondary | ICD-10-CM | POA: Diagnosis not present

## 2012-11-06 DIAGNOSIS — J449 Chronic obstructive pulmonary disease, unspecified: Secondary | ICD-10-CM | POA: Diagnosis not present

## 2012-11-06 DIAGNOSIS — I10 Essential (primary) hypertension: Secondary | ICD-10-CM | POA: Diagnosis not present

## 2012-11-06 DIAGNOSIS — C61 Malignant neoplasm of prostate: Secondary | ICD-10-CM | POA: Diagnosis not present

## 2012-11-07 DIAGNOSIS — R002 Palpitations: Secondary | ICD-10-CM | POA: Diagnosis not present

## 2012-11-07 DIAGNOSIS — J449 Chronic obstructive pulmonary disease, unspecified: Secondary | ICD-10-CM | POA: Diagnosis not present

## 2012-11-07 DIAGNOSIS — R609 Edema, unspecified: Secondary | ICD-10-CM | POA: Diagnosis not present

## 2012-11-08 DIAGNOSIS — S51009A Unspecified open wound of unspecified elbow, initial encounter: Secondary | ICD-10-CM | POA: Diagnosis not present

## 2012-11-08 DIAGNOSIS — H903 Sensorineural hearing loss, bilateral: Secondary | ICD-10-CM | POA: Diagnosis not present

## 2012-11-08 DIAGNOSIS — S81009A Unspecified open wound, unspecified knee, initial encounter: Secondary | ICD-10-CM | POA: Diagnosis not present

## 2012-11-08 DIAGNOSIS — D638 Anemia in other chronic diseases classified elsewhere: Secondary | ICD-10-CM | POA: Diagnosis not present

## 2012-11-08 DIAGNOSIS — J449 Chronic obstructive pulmonary disease, unspecified: Secondary | ICD-10-CM | POA: Diagnosis not present

## 2012-11-08 DIAGNOSIS — C61 Malignant neoplasm of prostate: Secondary | ICD-10-CM | POA: Diagnosis not present

## 2012-11-08 DIAGNOSIS — I10 Essential (primary) hypertension: Secondary | ICD-10-CM | POA: Diagnosis not present

## 2012-11-10 DIAGNOSIS — L02419 Cutaneous abscess of limb, unspecified: Secondary | ICD-10-CM | POA: Diagnosis not present

## 2012-11-14 DIAGNOSIS — J449 Chronic obstructive pulmonary disease, unspecified: Secondary | ICD-10-CM | POA: Diagnosis not present

## 2012-11-14 DIAGNOSIS — S51009A Unspecified open wound of unspecified elbow, initial encounter: Secondary | ICD-10-CM | POA: Diagnosis not present

## 2012-11-14 DIAGNOSIS — I10 Essential (primary) hypertension: Secondary | ICD-10-CM | POA: Diagnosis not present

## 2012-11-14 DIAGNOSIS — C61 Malignant neoplasm of prostate: Secondary | ICD-10-CM | POA: Diagnosis not present

## 2012-11-14 DIAGNOSIS — D638 Anemia in other chronic diseases classified elsewhere: Secondary | ICD-10-CM | POA: Diagnosis not present

## 2012-11-14 DIAGNOSIS — S81009A Unspecified open wound, unspecified knee, initial encounter: Secondary | ICD-10-CM | POA: Diagnosis not present

## 2012-11-16 DIAGNOSIS — M109 Gout, unspecified: Secondary | ICD-10-CM | POA: Diagnosis not present

## 2012-11-16 DIAGNOSIS — L97909 Non-pressure chronic ulcer of unspecified part of unspecified lower leg with unspecified severity: Secondary | ICD-10-CM | POA: Diagnosis not present

## 2012-11-16 DIAGNOSIS — S81009A Unspecified open wound, unspecified knee, initial encounter: Secondary | ICD-10-CM | POA: Diagnosis not present

## 2012-11-16 DIAGNOSIS — R7309 Other abnormal glucose: Secondary | ICD-10-CM | POA: Diagnosis not present

## 2012-11-16 DIAGNOSIS — E785 Hyperlipidemia, unspecified: Secondary | ICD-10-CM | POA: Diagnosis not present

## 2012-11-16 DIAGNOSIS — L02419 Cutaneous abscess of limb, unspecified: Secondary | ICD-10-CM | POA: Diagnosis not present

## 2012-11-16 DIAGNOSIS — M25569 Pain in unspecified knee: Secondary | ICD-10-CM | POA: Diagnosis not present

## 2012-11-17 DIAGNOSIS — J449 Chronic obstructive pulmonary disease, unspecified: Secondary | ICD-10-CM | POA: Diagnosis not present

## 2012-11-17 DIAGNOSIS — D638 Anemia in other chronic diseases classified elsewhere: Secondary | ICD-10-CM | POA: Diagnosis not present

## 2012-11-17 DIAGNOSIS — S51009A Unspecified open wound of unspecified elbow, initial encounter: Secondary | ICD-10-CM | POA: Diagnosis not present

## 2012-11-17 DIAGNOSIS — S81009A Unspecified open wound, unspecified knee, initial encounter: Secondary | ICD-10-CM | POA: Diagnosis not present

## 2012-11-17 DIAGNOSIS — C61 Malignant neoplasm of prostate: Secondary | ICD-10-CM | POA: Diagnosis not present

## 2012-11-17 DIAGNOSIS — I10 Essential (primary) hypertension: Secondary | ICD-10-CM | POA: Diagnosis not present

## 2012-11-21 ENCOUNTER — Ambulatory Visit (HOSPITAL_COMMUNITY)
Admission: RE | Admit: 2012-11-21 | Discharge: 2012-11-21 | Disposition: A | Discharge status: Home / Self Care | Payer: Medicare Other | Source: Ambulatory Visit | Attending: Cardiovascular Disease | Admitting: Cardiovascular Disease

## 2012-11-21 ENCOUNTER — Other Ambulatory Visit (HOSPITAL_COMMUNITY): Payer: Self-pay | Admitting: Orthopedic Surgery

## 2012-11-21 DIAGNOSIS — M7989 Other specified soft tissue disorders: Secondary | ICD-10-CM | POA: Diagnosis not present

## 2012-11-21 DIAGNOSIS — S81809A Unspecified open wound, unspecified lower leg, initial encounter: Secondary | ICD-10-CM | POA: Diagnosis not present

## 2012-11-21 DIAGNOSIS — M79609 Pain in unspecified limb: Secondary | ICD-10-CM | POA: Diagnosis not present

## 2012-11-21 DIAGNOSIS — L97909 Non-pressure chronic ulcer of unspecified part of unspecified lower leg with unspecified severity: Secondary | ICD-10-CM | POA: Diagnosis not present

## 2012-11-21 DIAGNOSIS — I10 Essential (primary) hypertension: Secondary | ICD-10-CM | POA: Diagnosis not present

## 2012-11-21 DIAGNOSIS — M109 Gout, unspecified: Secondary | ICD-10-CM | POA: Diagnosis not present

## 2012-11-21 DIAGNOSIS — S81009A Unspecified open wound, unspecified knee, initial encounter: Secondary | ICD-10-CM | POA: Diagnosis not present

## 2012-11-21 DIAGNOSIS — J449 Chronic obstructive pulmonary disease, unspecified: Secondary | ICD-10-CM | POA: Diagnosis not present

## 2012-11-21 DIAGNOSIS — L0390 Cellulitis, unspecified: Secondary | ICD-10-CM

## 2012-11-21 DIAGNOSIS — C61 Malignant neoplasm of prostate: Secondary | ICD-10-CM | POA: Diagnosis not present

## 2012-11-21 DIAGNOSIS — D638 Anemia in other chronic diseases classified elsewhere: Secondary | ICD-10-CM | POA: Diagnosis not present

## 2012-11-21 DIAGNOSIS — L02419 Cutaneous abscess of limb, unspecified: Secondary | ICD-10-CM | POA: Diagnosis not present

## 2012-11-21 DIAGNOSIS — S51009A Unspecified open wound of unspecified elbow, initial encounter: Secondary | ICD-10-CM | POA: Diagnosis not present

## 2012-11-21 NOTE — Progress Notes (Signed)
ABI completed.  Laasya Peyton, BS, RDMS, RVT  

## 2012-11-21 NOTE — Progress Notes (Signed)
Venous Duplex Lower Ext. Completed. Chemika Nightengale, BS, RDMS, RVT  

## 2012-11-24 DIAGNOSIS — D638 Anemia in other chronic diseases classified elsewhere: Secondary | ICD-10-CM | POA: Diagnosis not present

## 2012-11-24 DIAGNOSIS — S51009A Unspecified open wound of unspecified elbow, initial encounter: Secondary | ICD-10-CM | POA: Diagnosis not present

## 2012-11-24 DIAGNOSIS — S81009A Unspecified open wound, unspecified knee, initial encounter: Secondary | ICD-10-CM | POA: Diagnosis not present

## 2012-11-24 DIAGNOSIS — I10 Essential (primary) hypertension: Secondary | ICD-10-CM | POA: Diagnosis not present

## 2012-11-24 DIAGNOSIS — C61 Malignant neoplasm of prostate: Secondary | ICD-10-CM | POA: Diagnosis not present

## 2012-11-24 DIAGNOSIS — J449 Chronic obstructive pulmonary disease, unspecified: Secondary | ICD-10-CM | POA: Diagnosis not present

## 2012-12-04 ENCOUNTER — Ambulatory Visit (INDEPENDENT_AMBULATORY_CARE_PROVIDER_SITE_OTHER): Payer: Medicare Other | Admitting: Pulmonary Disease

## 2012-12-04 ENCOUNTER — Encounter: Payer: Self-pay | Admitting: Pulmonary Disease

## 2012-12-04 VITALS — BP 108/64 | HR 60 | Temp 97.7°F | Ht 70.0 in | Wt 185.4 lb

## 2012-12-04 DIAGNOSIS — J449 Chronic obstructive pulmonary disease, unspecified: Secondary | ICD-10-CM | POA: Diagnosis not present

## 2012-12-04 NOTE — Progress Notes (Signed)
  Subjective:    Patient ID: [name] [name], male    DOB: 1924/05/15, 79 y.o.   MRN: [id number]  HPI The patient comes in today for followup of his COPD.  He was started on anoro at the last visit for his increased dyspnea on exertion.  He then tried on many other inhalers in the past with no success.  The patient states that his breathing is definitely improved from the last visit, however he is not sure that he can afford the new medication.   Review of Systems  Constitutional: Negative for fever and unexpected weight change.  HENT: Negative for congestion, dental problem, ear pain, nosebleeds, postnasal drip, rhinorrhea, sinus pressure, sneezing, sore throat and trouble swallowing.   Eyes: Negative for redness and itching.  Respiratory: Negative for cough, chest tightness, shortness of breath and wheezing.   Cardiovascular: Negative for palpitations and leg swelling.  Gastrointestinal: Negative for nausea and vomiting.  Genitourinary: Negative for dysuria.  Musculoskeletal: Negative for joint swelling.  Skin: Negative for rash.  Neurological: Negative for headaches.  Hematological: Does not bruise/bleed easily.  Psychiatric/Behavioral: Negative for dysphoric mood. The patient is not nervous/anxious.        Objective:   Physical Exam Well-developed male in no acute distress Nose without purulent discharge noted Neck without lymphadenopathy or thyromegaly Chest with decreased breath sounds, no wheezing Lower extremities with minimal edema, no cyanosis Alert and oriented, moves all 4 extremities.       Assessment & Plan:

## 2012-12-04 NOTE — Patient Instructions (Signed)
Continue anoro one each am as much as you can.  Use the coupon included if you are able. Continue albuterol as needed for rescue Check your insurance plan and see if they cover "turdoza".  followup with me in 6mos

## 2012-12-04 NOTE — Assessment & Plan Note (Signed)
The patient has seen a definite improvement in his breathing with anoro, but he feels the medication is too expensive.  I will give him some more samples today, including a card for a 30 day supply.  He will check to see if there are any other options to get this.  I have also asked him to check his plan to see if [employer] is covered.  In the meantime, he can use albuterol as needed since he saw no improvement with Spiriva or LABA/ICS.

## 2012-12-05 DIAGNOSIS — Z961 Presence of intraocular lens: Secondary | ICD-10-CM | POA: Diagnosis not present

## 2012-12-05 DIAGNOSIS — H3530 Unspecified macular degeneration: Secondary | ICD-10-CM | POA: Diagnosis not present

## 2012-12-05 DIAGNOSIS — H52209 Unspecified astigmatism, unspecified eye: Secondary | ICD-10-CM | POA: Diagnosis not present

## 2012-12-25 DIAGNOSIS — J3489 Other specified disorders of nose and nasal sinuses: Secondary | ICD-10-CM | POA: Diagnosis not present

## 2012-12-25 DIAGNOSIS — G4730 Sleep apnea, unspecified: Secondary | ICD-10-CM | POA: Diagnosis not present

## 2012-12-25 DIAGNOSIS — I129 Hypertensive chronic kidney disease with stage 1 through stage 4 chronic kidney disease, or unspecified chronic kidney disease: Secondary | ICD-10-CM | POA: Diagnosis not present

## 2012-12-26 ENCOUNTER — Other Ambulatory Visit (HOSPITAL_BASED_OUTPATIENT_CLINIC_OR_DEPARTMENT_OTHER): Payer: Medicare Other | Admitting: Lab

## 2012-12-26 ENCOUNTER — Ambulatory Visit (HOSPITAL_BASED_OUTPATIENT_CLINIC_OR_DEPARTMENT_OTHER): Payer: Medicare Other

## 2012-12-26 VITALS — BP 130/76 | HR 84 | Temp 97.7°F | Resp 18

## 2012-12-26 DIAGNOSIS — J449 Chronic obstructive pulmonary disease, unspecified: Secondary | ICD-10-CM

## 2012-12-26 DIAGNOSIS — D638 Anemia in other chronic diseases classified elsewhere: Secondary | ICD-10-CM | POA: Diagnosis not present

## 2012-12-26 DIAGNOSIS — N289 Disorder of kidney and ureter, unspecified: Secondary | ICD-10-CM

## 2012-12-26 DIAGNOSIS — K432 Incisional hernia without obstruction or gangrene: Secondary | ICD-10-CM

## 2012-12-26 DIAGNOSIS — D631 Anemia in chronic kidney disease: Secondary | ICD-10-CM | POA: Diagnosis not present

## 2012-12-26 DIAGNOSIS — D649 Anemia, unspecified: Secondary | ICD-10-CM

## 2012-12-26 LAB — CBC WITH DIFFERENTIAL/PLATELET
Basophils Absolute: 0 10*3/uL (ref 0.0–0.1)
Eosinophils Absolute: 0.2 10*3/uL (ref 0.0–0.5)
HCT: 31.8 % — ABNORMAL LOW (ref 38.4–49.9)
LYMPH%: 26.1 % (ref 14.0–49.0)
MCV: 93 fL (ref 79.3–98.0)
MONO%: 6.6 % (ref 0.0–14.0)
NEUT#: 3.8 10*3/uL (ref 1.5–6.5)
NEUT%: 63 % (ref 39.0–75.0)
Platelets: 140 10*3/uL (ref 140–400)
RBC: 3.42 10*6/uL — ABNORMAL LOW (ref 4.20–5.82)

## 2012-12-26 MED ORDER — DARBEPOETIN ALFA-POLYSORBATE 300 MCG/0.6ML IJ SOLN
300.0000 ug | Freq: Once | INTRAMUSCULAR | Status: AC
  Administered 2012-12-26: 300 ug via SUBCUTANEOUS
  Filled 2012-12-26: qty 0.6

## 2013-01-08 ENCOUNTER — Encounter: Payer: Self-pay | Admitting: Podiatry

## 2013-01-08 ENCOUNTER — Ambulatory Visit (INDEPENDENT_AMBULATORY_CARE_PROVIDER_SITE_OTHER): Payer: Medicare Other

## 2013-01-08 ENCOUNTER — Ambulatory Visit (INDEPENDENT_AMBULATORY_CARE_PROVIDER_SITE_OTHER): Payer: Medicare Other | Admitting: Podiatry

## 2013-01-08 VITALS — BP 162/88 | HR 84 | Resp 12

## 2013-01-08 DIAGNOSIS — R52 Pain, unspecified: Secondary | ICD-10-CM

## 2013-01-08 DIAGNOSIS — M2022 Hallux rigidus, left foot: Secondary | ICD-10-CM

## 2013-01-08 DIAGNOSIS — M779 Enthesopathy, unspecified: Secondary | ICD-10-CM | POA: Diagnosis not present

## 2013-01-08 DIAGNOSIS — M2020 Hallux rigidus, unspecified foot: Secondary | ICD-10-CM

## 2013-01-08 MED ORDER — TRIAMCINOLONE ACETONIDE 10 MG/ML IJ SUSP
10.0000 mg | Freq: Once | INTRAMUSCULAR | Status: AC
  Administered 2013-01-08: 10 mg

## 2013-01-08 NOTE — Progress Notes (Signed)
N-SORE. SWOLLEN L-LT FOOT D-3 WEEKS O-SLOWLY C-WORSE A-PRESSURE T-COMPRESSION STOCKING

## 2013-01-08 NOTE — Progress Notes (Signed)
Subjective:     Patient ID: [name] [name], male   DOB: 1925/01/07, 79 y.o.   MRN: [id number]  HPI patient states that my left big toe joint has gotten sore especially at night and after I have been on it   Review of Systems     Objective:   Physical Exam Neurovascular status unchanged and patient is well oriented x3. Inflammation and pain around the first and K. left    Assessment:     Reviewed condition and advised on hallux limitus and capsulitis.    Plan:     Reviewed x-ray and injected around the joint 3 mg Kenalog 5 mg Xylocaine Marcaine mixture

## 2013-01-15 DIAGNOSIS — R3915 Urgency of urination: Secondary | ICD-10-CM | POA: Diagnosis not present

## 2013-01-15 DIAGNOSIS — N401 Enlarged prostate with lower urinary tract symptoms: Secondary | ICD-10-CM | POA: Diagnosis not present

## 2013-01-15 DIAGNOSIS — N318 Other neuromuscular dysfunction of bladder: Secondary | ICD-10-CM | POA: Diagnosis not present

## 2013-01-15 DIAGNOSIS — N139 Obstructive and reflux uropathy, unspecified: Secondary | ICD-10-CM | POA: Diagnosis not present

## 2013-01-15 DIAGNOSIS — Z8546 Personal history of malignant neoplasm of prostate: Secondary | ICD-10-CM | POA: Diagnosis not present

## 2013-01-22 DIAGNOSIS — Z8546 Personal history of malignant neoplasm of prostate: Secondary | ICD-10-CM | POA: Diagnosis not present

## 2013-01-22 DIAGNOSIS — N401 Enlarged prostate with lower urinary tract symptoms: Secondary | ICD-10-CM | POA: Diagnosis not present

## 2013-01-22 DIAGNOSIS — N139 Obstructive and reflux uropathy, unspecified: Secondary | ICD-10-CM | POA: Diagnosis not present

## 2013-01-22 DIAGNOSIS — T148XXA Other injury of unspecified body region, initial encounter: Secondary | ICD-10-CM | POA: Diagnosis not present

## 2013-01-22 DIAGNOSIS — S61409A Unspecified open wound of unspecified hand, initial encounter: Secondary | ICD-10-CM | POA: Diagnosis not present

## 2013-01-25 ENCOUNTER — Ambulatory Visit (INDEPENDENT_AMBULATORY_CARE_PROVIDER_SITE_OTHER): Payer: Medicare Other | Admitting: *Deleted

## 2013-01-25 DIAGNOSIS — I495 Sick sinus syndrome: Secondary | ICD-10-CM

## 2013-01-25 DIAGNOSIS — I4891 Unspecified atrial fibrillation: Secondary | ICD-10-CM

## 2013-01-25 LAB — MDC_IDC_ENUM_SESS_TYPE_INCLINIC
Battery Remaining Longevity: 77 mo
Battery Voltage: 2.77 V
Lead Channel Pacing Threshold Amplitude: 0.75 V
Lead Channel Pacing Threshold Pulse Width: 0.4 ms
Lead Channel Setting Pacing Amplitude: 2 V
Lead Channel Setting Pacing Amplitude: 2.5 V
Lead Channel Setting Pacing Pulse Width: 0.4 ms
Lead Channel Setting Sensing Sensitivity: 2 mV

## 2013-01-25 NOTE — Progress Notes (Signed)
Pacemaker check in clinic. Normal device function. Thresholds, sensing, impedances consistent with previous measurements. Device programmed to maximize longevity. 7 mode switches---<0.1% (all <30 sec/ no EGMs). 11 high ventricular rates noted---max dur. 5 sec, Max V 187, Avg V 160 for all---episodes consistent with brief AT per EGMs/ Markers. Device programmed at appropriate safety margins. Histogram distribution appropriate for patient activity level. Device programmed to optimize intrinsic conduction. Estimated longevity 6.5 years. Patient will follow up with SK in 3 months. Filter given for patient's [employer].

## 2013-01-26 ENCOUNTER — Encounter: Payer: Self-pay | Admitting: Internal Medicine

## 2013-02-05 ENCOUNTER — Ambulatory Visit (INDEPENDENT_AMBULATORY_CARE_PROVIDER_SITE_OTHER): Payer: Medicare Other | Admitting: Podiatry

## 2013-02-05 ENCOUNTER — Encounter: Payer: Self-pay | Admitting: Podiatry

## 2013-02-05 VITALS — BP 162/79 | HR 71 | Resp 16

## 2013-02-05 DIAGNOSIS — M779 Enthesopathy, unspecified: Secondary | ICD-10-CM | POA: Diagnosis not present

## 2013-02-05 DIAGNOSIS — S61409A Unspecified open wound of unspecified hand, initial encounter: Secondary | ICD-10-CM | POA: Diagnosis not present

## 2013-02-05 DIAGNOSIS — L84 Corns and callosities: Secondary | ICD-10-CM

## 2013-02-05 DIAGNOSIS — T148XXA Other injury of unspecified body region, initial encounter: Secondary | ICD-10-CM | POA: Diagnosis not present

## 2013-02-05 MED ORDER — TRIAMCINOLONE ACETONIDE 10 MG/ML IJ SUSP
10.0000 mg | Freq: Once | INTRAMUSCULAR | Status: AC
  Administered 2013-02-05: 10 mg

## 2013-02-05 NOTE — Progress Notes (Signed)
Subjective:     Patient ID: [name] [name], male   DOB: Jan 06, 1925, 79 y.o.   MRN: [id number]  HPI patient states that my left first MPJ is still hurting but it seems to be in a different place and mostly at night   Review of Systems     Objective:   Physical Exam Neurovascular status is intact with the first MPJ lateral joint doing well but discomfort on the medial side plantar or I also noted a small keratotic lesion formation    Assessment:     Capsulitis of the left first MPJ medial side lesion formation    Plan:     Carefully injected the area 3 mg Kenalog dexamethasone combination along with Xylocaine and debrided the lesion. Reappoint as needed

## 2013-02-20 ENCOUNTER — Telehealth: Payer: Self-pay | Admitting: Oncology

## 2013-02-20 ENCOUNTER — Ambulatory Visit: Payer: Medicare Other

## 2013-02-20 ENCOUNTER — Encounter: Payer: Self-pay | Admitting: Oncology

## 2013-02-20 ENCOUNTER — Ambulatory Visit (HOSPITAL_BASED_OUTPATIENT_CLINIC_OR_DEPARTMENT_OTHER): Payer: Medicare Other | Admitting: Oncology

## 2013-02-20 ENCOUNTER — Other Ambulatory Visit (HOSPITAL_BASED_OUTPATIENT_CLINIC_OR_DEPARTMENT_OTHER): Payer: Medicare Other

## 2013-02-20 VITALS — BP 151/74 | HR 86 | Temp 97.4°F | Resp 17 | Ht 70.0 in | Wt 189.4 lb

## 2013-02-20 DIAGNOSIS — D638 Anemia in other chronic diseases classified elsewhere: Secondary | ICD-10-CM | POA: Diagnosis not present

## 2013-02-20 DIAGNOSIS — D649 Anemia, unspecified: Secondary | ICD-10-CM

## 2013-02-20 DIAGNOSIS — J449 Chronic obstructive pulmonary disease, unspecified: Secondary | ICD-10-CM

## 2013-02-20 DIAGNOSIS — C61 Malignant neoplasm of prostate: Secondary | ICD-10-CM | POA: Diagnosis not present

## 2013-02-20 DIAGNOSIS — K432 Incisional hernia without obstruction or gangrene: Secondary | ICD-10-CM

## 2013-02-20 LAB — CBC WITH DIFFERENTIAL/PLATELET
BASO%: 0.4 % (ref 0.0–2.0)
BASOS ABS: 0 10*3/uL (ref 0.0–0.1)
EOS%: 2.6 % (ref 0.0–7.0)
Eosinophils Absolute: 0.2 10*3/uL (ref 0.0–0.5)
HCT: 33.4 % — ABNORMAL LOW (ref 38.4–49.9)
HEMOGLOBIN: 11.1 g/dL — AB (ref 13.0–17.1)
LYMPH%: 22.5 % (ref 14.0–49.0)
MCH: 30.8 pg (ref 27.2–33.4)
MCHC: 33.2 g/dL (ref 32.0–36.0)
MCV: 92.7 fL (ref 79.3–98.0)
MONO#: 0.4 10*3/uL (ref 0.1–0.9)
MONO%: 6.1 % (ref 0.0–14.0)
NEUT#: 4 10*3/uL (ref 1.5–6.5)
NEUT%: 68.4 % (ref 39.0–75.0)
Platelets: 122 10*3/uL — ABNORMAL LOW (ref 140–400)
RBC: 3.61 10*6/uL — AB (ref 4.20–5.82)
RDW: 15.6 % — AB (ref 11.0–14.6)
WBC: 5.9 10*3/uL (ref 4.0–10.3)
lymph#: 1.3 10*3/uL (ref 0.9–3.3)

## 2013-02-20 MED ORDER — DARBEPOETIN ALFA-POLYSORBATE 300 MCG/0.6ML IJ SOLN: 300.0000 ug | Freq: Once | INTRAMUSCULAR | Status: DC

## 2013-02-20 NOTE — Telephone Encounter (Signed)
Gave pt appt for lab,ML and injections until August 2015, pt wants to have injections lab and MD @ same time instead of 6 months MD visit on July  2015

## 2013-02-20 NOTE — Progress Notes (Signed)
Hematology and Oncology Follow Up Visit  [name] [name] [id number] [birth year] 80 y.o. 02/20/2013 2:54 PM  CC: [name] [name], M.D.  [name] Farr. [name] [name], M.D.    Principle Diagnosis:  80 year old gentleman with the following diagnoses. 1. Multifactorial anemia.  He has anemia of chronic disease.  It may be anemia of renal insufficiency. 2. History of prostate cancer status post radiation therapy completed in 2008.  Current therapy: He is on Aranesp 300 mcg subcutaneously every 8 weeks to keep his hemoglobin above 11.  Interim History: [name] [name] presents today for a followup visit.  Pleasant gentleman with a few comorbid conditions that include hypertension as well as prostate cancer.  He also has anemia of chronic disease as outlined above.  He had been receiving Aranesp as mentioned to keep his hemoglobin above 11 and had been doing relatively well with it.  He had not had any complications associated with it.  No new complaints since the last visit. He has been needing it every other month or so. His performance status is stable and still able to work part time.   Medications: I have reviewed the patient's current medications. Current Outpatient Prescriptions  Medication Sig Dispense Refill  . ALPHA-LIPOIC ACID PO Take 400 mg by mouth daily.       Marland Kitchen aspirin EC 81 MG tablet Take 81 mg by mouth daily.      . calcium-vitamin D (OSCAL WITH D) 500-200 MG-UNIT per tablet Take 1 tablet by mouth daily with breakfast.       . darbepoetin alfa-polysorbate (ARANESP, ALB FREE, SURECLICK) 643 MCG/ML injection Inject 500 mcg into the skin as needed. Every 2 months      . folic acid (FOLVITE) 1 MG tablet Take 1 mg by mouth daily.      . furosemide (LASIX) 40 MG tablet Take 80 mg by mouth daily. Patient takes an extra 40 mg PRN at noon      . lisinopril (PRINIVIL,ZESTRIL) 20 MG tablet Take 10 mg by mouth daily.      . Multiple Vitamins-Minerals (ICAPS PO) Take 2 capsules by mouth daily.       .  Multiple Vitamins-Minerals (MULTIVITAMIN WITH MINERALS) tablet Take 1 tablet by mouth daily.        . Omega-3 Fatty Acids (FISH OIL) 1200 MG CAPS Take 1 capsule by mouth daily.        Marland Kitchen omeprazole (PRILOSEC) 20 MG capsule Take 20 mg by mouth daily.       . traMADol (ULTRAM) 50 MG tablet Take 50 mg by mouth every 6 (six) hours as needed.      . vitamin B-12 (CYANOCOBALAMIN) 500 MCG tablet Take 500 mcg by mouth daily.        No current facility-administered medications for this visit.    Allergies: No Known Allergies  Past Medical History, Surgical history, Social history, and Family History were reviewed and updated.  Review of Systems: Constitutional:  Negative for fever, chills, night sweats, anorexia, weight loss, pain. Cardiovascular: no chest pain or dyspnea on exertion Respiratory: no cough, shortness of breath, or wheezing Neurological: no TIA or stroke symptoms Dermatological: negative ENT: negative Skin: Negative. Gastrointestinal: no abdominal pain, change in bowel habits, or black or bloody stools Genito-Urinary: negative Hematological and Lymphatic: negative Breast: negative Musculoskeletal: negative Remaining ROS negative.  Physical Exam: Blood pressure 151/74, pulse 86, temperature 97.4 F (36.3 C), temperature source Oral, resp. rate 17, height 5\' 10"  (1.778 m), weight 189 lb 6.4 oz (  85.911 kg), SpO2 98.00%. ECOG: 1 General appearance: alert Head: Normocephalic, without obvious abnormality, atraumatic Neck: no adenopathy, no carotid bruit, no JVD, supple, symmetrical, trachea midline and thyroid not enlarged, symmetric, no tenderness/mass/nodules Lymph nodes: Cervical, supraclavicular, and axillary nodes normal. Heart:regular rate and rhythm, S1, S2 normal, no murmur, click, rub or gallop Lung:chest clear, no wheezing, rales, normal symmetric air entry Abdomen: soft, non-tender, without masses or organomegaly EXT:no erythema, induration, or nodules   Lab  Results: Lab Results  Component Value Date   WBC 5.9 02/20/2013   HGB 11.1* 02/20/2013   HCT 33.4* 02/20/2013   MCV 92.7 02/20/2013   PLT 122* 02/20/2013     Chemistry      Component Value Date/Time   NA 134* 10/23/2012 0419   K 4.1 10/23/2012 0419   CL 103 10/23/2012 0419   CO2 27 10/23/2012 0419   BUN 15 10/23/2012 0419   CREATININE 0.81 10/23/2012 0419      Component Value Date/Time   CALCIUM 8.0* 10/23/2012 0419   ALKPHOS 83 10/17/2012 1555   AST 21 10/17/2012 1555   ALT 11 10/17/2012 1555   BILITOT 0.4 10/17/2012 1555       Impression and Plan:  80 year old gentleman with the following issues. 1. Multifactorial anemia.  There is an element of anemia of renal insufficiency.  Currently on Aranesp 300 mcg to keep his hemoglobin above 11.  He will not need an injection today but we will monitor him on every other month basis and given Aransep for Hgb under 11.  2. Prostate cancer.  No relapse at this point. He sees Dr [name] [name] in November for routine follow-up. 3. Follow-up. Aranesp injection every 8 weeks. Visit in 6 months.  Shanna Un 1/6/20152:54 PM

## 2013-02-22 ENCOUNTER — Telehealth: Payer: Self-pay | Admitting: Oncology

## 2013-02-22 DIAGNOSIS — L570 Actinic keratosis: Secondary | ICD-10-CM | POA: Diagnosis not present

## 2013-02-22 DIAGNOSIS — M6740 Ganglion, unspecified site: Secondary | ICD-10-CM | POA: Diagnosis not present

## 2013-02-22 DIAGNOSIS — D235 Other benign neoplasm of skin of trunk: Secondary | ICD-10-CM | POA: Diagnosis not present

## 2013-02-22 DIAGNOSIS — D1801 Hemangioma of skin and subcutaneous tissue: Secondary | ICD-10-CM | POA: Diagnosis not present

## 2013-02-22 NOTE — Telephone Encounter (Signed)
pt called confused of why his appts were made every 8 weeks instead of every 2 weeks I reread POF of 01/06 which clearly states every 8 weeks Went over appt schedule w pt He voiced understanding shh

## 2013-03-08 DIAGNOSIS — M47817 Spondylosis without myelopathy or radiculopathy, lumbosacral region: Secondary | ICD-10-CM | POA: Diagnosis not present

## 2013-03-08 DIAGNOSIS — M159 Polyosteoarthritis, unspecified: Secondary | ICD-10-CM | POA: Diagnosis not present

## 2013-03-12 DIAGNOSIS — M67919 Unspecified disorder of synovium and tendon, unspecified shoulder: Secondary | ICD-10-CM | POA: Diagnosis not present

## 2013-03-12 DIAGNOSIS — I83219 Varicose veins of right lower extremity with both ulcer of unspecified site and inflammation: Secondary | ICD-10-CM | POA: Diagnosis not present

## 2013-03-12 DIAGNOSIS — M719 Bursopathy, unspecified: Secondary | ICD-10-CM | POA: Diagnosis not present

## 2013-03-12 DIAGNOSIS — L97929 Non-pressure chronic ulcer of unspecified part of left lower leg with unspecified severity: Secondary | ICD-10-CM | POA: Diagnosis not present

## 2013-03-12 DIAGNOSIS — L97209 Non-pressure chronic ulcer of unspecified calf with unspecified severity: Secondary | ICD-10-CM | POA: Diagnosis not present

## 2013-03-15 DIAGNOSIS — M47814 Spondylosis without myelopathy or radiculopathy, thoracic region: Secondary | ICD-10-CM | POA: Diagnosis not present

## 2013-03-19 DIAGNOSIS — M47814 Spondylosis without myelopathy or radiculopathy, thoracic region: Secondary | ICD-10-CM | POA: Diagnosis not present

## 2013-03-23 DIAGNOSIS — M47814 Spondylosis without myelopathy or radiculopathy, thoracic region: Secondary | ICD-10-CM | POA: Diagnosis not present

## 2013-03-26 DIAGNOSIS — M47814 Spondylosis without myelopathy or radiculopathy, thoracic region: Secondary | ICD-10-CM | POA: Diagnosis not present

## 2013-03-30 ENCOUNTER — Encounter: Payer: Self-pay | Admitting: Internal Medicine

## 2013-03-30 DIAGNOSIS — M47814 Spondylosis without myelopathy or radiculopathy, thoracic region: Secondary | ICD-10-CM | POA: Diagnosis not present

## 2013-04-09 DIAGNOSIS — M1710 Unilateral primary osteoarthritis, unspecified knee: Secondary | ICD-10-CM | POA: Diagnosis not present

## 2013-04-09 DIAGNOSIS — M2020 Hallux rigidus, unspecified foot: Secondary | ICD-10-CM | POA: Diagnosis not present

## 2013-04-17 ENCOUNTER — Other Ambulatory Visit (HOSPITAL_BASED_OUTPATIENT_CLINIC_OR_DEPARTMENT_OTHER): Payer: Medicare Other

## 2013-04-17 ENCOUNTER — Ambulatory Visit (HOSPITAL_BASED_OUTPATIENT_CLINIC_OR_DEPARTMENT_OTHER): Payer: Medicare Other

## 2013-04-17 VITALS — BP 138/55 | HR 91 | Temp 97.6°F

## 2013-04-17 DIAGNOSIS — D638 Anemia in other chronic diseases classified elsewhere: Secondary | ICD-10-CM

## 2013-04-17 DIAGNOSIS — D649 Anemia, unspecified: Secondary | ICD-10-CM

## 2013-04-17 DIAGNOSIS — J449 Chronic obstructive pulmonary disease, unspecified: Secondary | ICD-10-CM

## 2013-04-17 DIAGNOSIS — K432 Incisional hernia without obstruction or gangrene: Secondary | ICD-10-CM

## 2013-04-17 DIAGNOSIS — N289 Disorder of kidney and ureter, unspecified: Secondary | ICD-10-CM

## 2013-04-17 LAB — CBC WITH DIFFERENTIAL/PLATELET
BASO%: 1 % (ref 0.0–2.0)
Basophils Absolute: 0.1 10*3/uL (ref 0.0–0.1)
EOS%: 3.3 % (ref 0.0–7.0)
Eosinophils Absolute: 0.2 10*3/uL (ref 0.0–0.5)
HCT: 32.3 % — ABNORMAL LOW (ref 38.4–49.9)
HGB: 10.7 g/dL — ABNORMAL LOW (ref 13.0–17.1)
LYMPH%: 22.6 % (ref 14.0–49.0)
MCH: 31.2 pg (ref 27.2–33.4)
MCHC: 33.1 g/dL (ref 32.0–36.0)
MCV: 94.3 fL (ref 79.3–98.0)
MONO#: 0.4 10*3/uL (ref 0.1–0.9)
MONO%: 7.1 % (ref 0.0–14.0)
NEUT#: 3.7 10*3/uL (ref 1.5–6.5)
NEUT%: 66 % (ref 39.0–75.0)
PLATELETS: 127 10*3/uL — AB (ref 140–400)
RBC: 3.43 10*6/uL — AB (ref 4.20–5.82)
RDW: 14.5 % (ref 11.0–14.6)
WBC: 5.6 10*3/uL (ref 4.0–10.3)
lymph#: 1.3 10*3/uL (ref 0.9–3.3)

## 2013-04-17 MED ORDER — DARBEPOETIN ALFA-POLYSORBATE 300 MCG/0.6ML IJ SOLN
300.0000 ug | Freq: Once | INTRAMUSCULAR | Status: AC
  Administered 2013-04-17: 300 ug via SUBCUTANEOUS
  Filled 2013-04-17: qty 0.6

## 2013-04-17 NOTE — Patient Instructions (Signed)
Darbepoetin Alfa injection What is this medicine? DARBEPOETIN ALFA (dar be POE e tin AL fa) helps your body make more red blood cells. It is used to treat anemia caused by chronic kidney failure and chemotherapy. This medicine may be used for other purposes; ask your health care provider or pharmacist if you have questions. COMMON BRAND NAME(S): Aranesp What should I tell my health care provider before I take this medicine? They need to know if you have any of these conditions: -blood clotting disorders or history of blood clots -cancer patient not on chemotherapy -cystic fibrosis -heart disease, such as angina, heart failure, or a history of a heart attack -hemoglobin level of 12 g/dL or greater -high blood pressure -low levels of folate, iron, or vitamin B12 -seizures -an unusual or allergic reaction to darbepoetin, erythropoietin, albumin, hamster proteins, latex, other medicines, foods, dyes, or preservatives -pregnant or trying to get pregnant -breast-feeding How should I use this medicine? This medicine is for injection into a vein or under the skin. It is usually given by a health care professional in a hospital or clinic setting. If you get this medicine at home, you will be taught how to prepare and give this medicine. Do not shake the solution before you withdraw a dose. Use exactly as directed. Take your medicine at regular intervals. Do not take your medicine more often than directed. It is important that you put your used needles and syringes in a special sharps container. Do not put them in a trash can. If you do not have a sharps container, call your pharmacist or healthcare provider to get one. Talk to your pediatrician regarding the use of this medicine in children. While this medicine may be used in children as young as 1 year for selected conditions, precautions do apply. Overdosage: If you think you have taken too much of this medicine contact a poison control center or  emergency room at once. NOTE: This medicine is only for you. Do not share this medicine with others. What if I miss a dose? If you miss a dose, take it as soon as you can. If it is almost time for your next dose, take only that dose. Do not take double or extra doses. What may interact with this medicine? Do not take this medicine with any of the following medications: -epoetin alfa This list may not describe all possible interactions. Give your health care provider a list of all the medicines, herbs, non-prescription drugs, or dietary supplements you use. Also tell them if you smoke, drink alcohol, or use illegal drugs. Some items may interact with your medicine. What should I watch for while using this medicine? Visit your prescriber or health care professional for regular checks on your progress and for the needed blood tests and blood pressure measurements. It is especially important for the doctor to make sure your hemoglobin level is in the desired range, to limit the risk of potential side effects and to give you the best benefit. Keep all appointments for any recommended tests. Check your blood pressure as directed. Ask your doctor what your blood pressure should be and when you should contact him or her. As your body makes more red blood cells, you may need to take iron, folic acid, or vitamin B supplements. Ask your doctor or health care provider which products are right for you. If you have kidney disease continue dietary restrictions, even though this medication can make you feel better. Talk with your doctor or health   care professional about the foods you eat and the vitamins that you take. What side effects may I notice from receiving this medicine? Side effects that you should report to your doctor or health care professional as soon as possible: -allergic reactions like skin rash, itching or hives, swelling of the face, lips, or tongue -breathing problems -changes in vision -chest  pain -confusion, trouble speaking or understanding -feeling faint or lightheaded, falls -high blood pressure -muscle aches or pains -pain, swelling, warmth in the leg -rapid weight gain -severe headaches -sudden numbness or weakness of the face, arm or leg -trouble walking, dizziness, loss of balance or coordination -seizures (convulsions) -swelling of the ankles, feet, hands -unusually weak or tired Side effects that usually do not require medical attention (report to your doctor or health care professional if they continue or are bothersome): -diarrhea -fever, chills (flu-like symptoms) -headaches -nausea, vomiting -redness, stinging, or swelling at site where injected This list may not describe all possible side effects. Call your doctor for medical advice about side effects. You may report side effects to FDA at 1-800-FDA-1088. Where should I keep my medicine? Keep out of the reach of children. Store in a refrigerator between 2 and 8 degrees C (36 and 46 degrees F). Do not freeze. Do not shake. Throw away any unused portion if using a single-dose vial. Throw away any unused medicine after the expiration date. NOTE: This sheet is a summary. It may not cover all possible information. If you have questions about this medicine, talk to your doctor, pharmacist, or health care provider.  2014, Elsevier/[employer] Standard. (2008-01-16 10:23:57)  

## 2013-04-19 DIAGNOSIS — M6740 Ganglion, unspecified site: Secondary | ICD-10-CM | POA: Diagnosis not present

## 2013-04-19 DIAGNOSIS — M6749 Ganglion, multiple sites: Secondary | ICD-10-CM | POA: Diagnosis not present

## 2013-04-19 DIAGNOSIS — D485 Neoplasm of uncertain behavior of skin: Secondary | ICD-10-CM | POA: Diagnosis not present

## 2013-04-20 DIAGNOSIS — I10 Essential (primary) hypertension: Secondary | ICD-10-CM | POA: Diagnosis not present

## 2013-04-20 DIAGNOSIS — K219 Gastro-esophageal reflux disease without esophagitis: Secondary | ICD-10-CM | POA: Diagnosis not present

## 2013-04-20 DIAGNOSIS — D649 Anemia, unspecified: Secondary | ICD-10-CM | POA: Diagnosis not present

## 2013-04-20 DIAGNOSIS — R269 Unspecified abnormalities of gait and mobility: Secondary | ICD-10-CM | POA: Diagnosis not present

## 2013-05-07 ENCOUNTER — Encounter: Payer: Medicare Other | Admitting: Internal Medicine

## 2013-05-10 ENCOUNTER — Encounter: Payer: Medicare Other | Admitting: Internal Medicine

## 2013-05-14 DIAGNOSIS — Z23 Encounter for immunization: Secondary | ICD-10-CM | POA: Diagnosis not present

## 2013-05-14 DIAGNOSIS — I10 Essential (primary) hypertension: Secondary | ICD-10-CM | POA: Diagnosis not present

## 2013-05-14 DIAGNOSIS — Z79899 Other long term (current) drug therapy: Secondary | ICD-10-CM | POA: Diagnosis not present

## 2013-05-14 DIAGNOSIS — R5383 Other fatigue: Secondary | ICD-10-CM | POA: Diagnosis not present

## 2013-05-14 DIAGNOSIS — Z0000 Encounter for general adult medical examination without abnormal findings: Secondary | ICD-10-CM | POA: Diagnosis not present

## 2013-05-14 DIAGNOSIS — Z1331 Encounter for screening for depression: Secondary | ICD-10-CM | POA: Diagnosis not present

## 2013-05-14 DIAGNOSIS — R1310 Dysphagia, unspecified: Secondary | ICD-10-CM | POA: Diagnosis not present

## 2013-05-14 DIAGNOSIS — R5381 Other malaise: Secondary | ICD-10-CM | POA: Diagnosis not present

## 2013-05-17 ENCOUNTER — Encounter: Payer: Medicare Other | Admitting: Internal Medicine

## 2013-05-24 ENCOUNTER — Encounter: Payer: Self-pay | Admitting: *Deleted

## 2013-06-04 ENCOUNTER — Ambulatory Visit (INDEPENDENT_AMBULATORY_CARE_PROVIDER_SITE_OTHER): Payer: Medicare Other | Admitting: Pulmonary Disease

## 2013-06-04 ENCOUNTER — Encounter: Payer: Self-pay | Admitting: Pulmonary Disease

## 2013-06-04 VITALS — BP 152/70 | HR 80 | Temp 96.9°F | Ht 70.0 in | Wt 188.6 lb

## 2013-06-04 DIAGNOSIS — J449 Chronic obstructive pulmonary disease, unspecified: Secondary | ICD-10-CM

## 2013-06-04 MED ORDER — ALBUTEROL SULFATE HFA 108 (90 BASE) MCG/ACT IN AERS: 2.0000 | INHALATION_SPRAY | Freq: Four times a day (QID) | RESPIRATORY_TRACT | Status: DC | PRN

## 2013-06-04 NOTE — Patient Instructions (Signed)
Stay on spiriva, but will need to have a rescue inhaler handy in the event you get into trouble.  Will give you a prescription you can take to [place]. Stay as active as possible. followup with me again in 33mos.

## 2013-06-04 NOTE — Progress Notes (Signed)
   Subjective:    Patient ID: [name] [name], male    DOB: August 13, 1924, 80 y.o.   MRN: [id number]  HPI  the patient comes in today for followup of his known COPD. He was unable to get anoro because of cost, but he is getting his Spiriva from the [facility] hospital. He did not seen any difference on a LABA/ICS.  He feels that his breathing is about the same, but may be a little worse on some days. He denies any significant cough or mucus production.  He is asking about having spirometry today to see if his flows have changed over the last year or so.   Review of Systems  Constitutional: Negative for fever and unexpected weight change.  HENT: Negative for congestion, dental problem, ear pain, nosebleeds, postnasal drip, rhinorrhea, sinus pressure, sneezing, sore throat and trouble swallowing.   Eyes: Negative for redness and itching.  Respiratory: Positive for shortness of breath and wheezing. Negative for cough and chest tightness.   Cardiovascular: Negative for palpitations and leg swelling.  Gastrointestinal: Negative for nausea and vomiting.  Genitourinary: Negative for dysuria.  Musculoskeletal: Negative for joint swelling.  Skin: Negative for rash.  Neurological: Negative for headaches.  Hematological: Does not bruise/bleed easily.  Psychiatric/Behavioral: Negative for dysphoric mood. The patient is not nervous/anxious.        Objective:   Physical Exam Well-developed male in no acute distress Nose without purulence or discharge noted Neck without lymphadenopathy or thyromegaly Chest totally clear to auscultation, no wheezing Cardiac exam with regular rate and rhythm Lower extremities with 1+ edema, no cyanosis Alert and oriented, moves all 4 extremities.       Assessment & Plan:

## 2013-06-04 NOTE — Assessment & Plan Note (Signed)
The patient has been fairly stable overall from a pulmonary standpoint, although he feels he may be a little more short of breath than the last visit. His spirometry today shows a fairly significant improvement in his FEV1 from last year. I think a lot of his issues are related to muscle strength and coordination/balance, and I've asked him to work on this. He did see a response to anoro, and would be interested in going on this medication when it becomes available at the [facility]. For now, he will stay on Spiriva.

## 2013-06-07 DIAGNOSIS — G609 Hereditary and idiopathic neuropathy, unspecified: Secondary | ICD-10-CM | POA: Diagnosis not present

## 2013-06-07 DIAGNOSIS — M47817 Spondylosis without myelopathy or radiculopathy, lumbosacral region: Secondary | ICD-10-CM | POA: Diagnosis not present

## 2013-06-07 DIAGNOSIS — IMO0002 Reserved for concepts with insufficient information to code with codable children: Secondary | ICD-10-CM | POA: Diagnosis not present

## 2013-06-07 DIAGNOSIS — M1710 Unilateral primary osteoarthritis, unspecified knee: Secondary | ICD-10-CM | POA: Diagnosis not present

## 2013-06-12 ENCOUNTER — Ambulatory Visit (HOSPITAL_BASED_OUTPATIENT_CLINIC_OR_DEPARTMENT_OTHER): Payer: Medicare Other

## 2013-06-12 ENCOUNTER — Encounter: Payer: Self-pay | Admitting: *Deleted

## 2013-06-12 ENCOUNTER — Other Ambulatory Visit (HOSPITAL_BASED_OUTPATIENT_CLINIC_OR_DEPARTMENT_OTHER): Payer: Medicare Other

## 2013-06-12 VITALS — BP 145/64 | HR 80 | Temp 97.7°F

## 2013-06-12 DIAGNOSIS — J449 Chronic obstructive pulmonary disease, unspecified: Secondary | ICD-10-CM

## 2013-06-12 DIAGNOSIS — D638 Anemia in other chronic diseases classified elsewhere: Secondary | ICD-10-CM

## 2013-06-12 DIAGNOSIS — D649 Anemia, unspecified: Secondary | ICD-10-CM

## 2013-06-12 DIAGNOSIS — N289 Disorder of kidney and ureter, unspecified: Secondary | ICD-10-CM

## 2013-06-12 DIAGNOSIS — K432 Incisional hernia without obstruction or gangrene: Secondary | ICD-10-CM

## 2013-06-12 LAB — CBC WITH DIFFERENTIAL/PLATELET
BASO%: 0.6 % (ref 0.0–2.0)
Basophils Absolute: 0 10*3/uL (ref 0.0–0.1)
EOS%: 4.5 % (ref 0.0–7.0)
Eosinophils Absolute: 0.2 10*3/uL (ref 0.0–0.5)
HEMATOCRIT: 30.9 % — AB (ref 38.4–49.9)
HGB: 10.1 g/dL — ABNORMAL LOW (ref 13.0–17.1)
LYMPH#: 1.4 10*3/uL (ref 0.9–3.3)
LYMPH%: 30 % (ref 14.0–49.0)
MCH: 30.8 pg (ref 27.2–33.4)
MCHC: 32.6 g/dL (ref 32.0–36.0)
MCV: 94.4 fL (ref 79.3–98.0)
MONO#: 0.3 10*3/uL (ref 0.1–0.9)
MONO%: 6.3 % (ref 0.0–14.0)
NEUT#: 2.8 10*3/uL (ref 1.5–6.5)
NEUT%: 58.6 % (ref 39.0–75.0)
Platelets: 111 10*3/uL — ABNORMAL LOW (ref 140–400)
RBC: 3.28 10*6/uL — AB (ref 4.20–5.82)
RDW: 14.3 % (ref 11.0–14.6)
WBC: 4.8 10*3/uL (ref 4.0–10.3)

## 2013-06-12 MED ORDER — DARBEPOETIN ALFA-POLYSORBATE 300 MCG/0.6ML IJ SOLN
300.0000 ug | Freq: Once | INTRAMUSCULAR | Status: AC
  Administered 2013-06-12: 300 ug via SUBCUTANEOUS
  Filled 2013-06-12: qty 0.6

## 2013-06-14 ENCOUNTER — Encounter: Payer: Self-pay | Admitting: Internal Medicine

## 2013-06-14 ENCOUNTER — Ambulatory Visit (INDEPENDENT_AMBULATORY_CARE_PROVIDER_SITE_OTHER): Payer: Medicare Other | Admitting: Internal Medicine

## 2013-06-14 VITALS — BP 142/73 | HR 77 | Ht 70.0 in | Wt 185.0 lb

## 2013-06-14 DIAGNOSIS — Z950 Presence of cardiac pacemaker: Secondary | ICD-10-CM

## 2013-06-14 DIAGNOSIS — I4891 Unspecified atrial fibrillation: Secondary | ICD-10-CM

## 2013-06-14 DIAGNOSIS — I495 Sick sinus syndrome: Secondary | ICD-10-CM | POA: Diagnosis not present

## 2013-06-14 NOTE — Patient Instructions (Signed)
Your physician recommends that you continue on your current medications as directed. Please refer to the Current Medication list given to you today.  Remote monitoring is used to monitor your Pacemaker of ICD from home. This monitoring reduces the number of office visits required to check your device to one time per year. It allows [place] to keep an eye on the functioning of your device to ensure it is working properly. You are scheduled for a device check from home on 09/13/13. You may send your transmission at any time that day. If you have a wireless device, the transmission will be sent automatically. After your physician reviews your transmission, you will receive a postcard with your next transmission date.  Your physician wants you to follow-up in: 4-5 months with Dr. Irish Lack.  You will receive a reminder letter in the mail two months in advance. If you don't receive a letter, please call our office to schedule the follow-up appointment.  Your physician wants you to follow-up in: 1 year with Dr. Caryl Comes.  You will receive a reminder letter in the mail two months in advance. If you don't receive a letter, please call our office to schedule the follow-up appointment.

## 2013-06-14 NOTE — Progress Notes (Signed)
Patient Care Team: Lajean Manes, MD as PCP - General (Internal Medicine) Wyatt Portela, MD (Hematology and Oncology) Claybon Jabs, MD as Attending Physician (Urology)   HPI  [name] [name] is a 80 y.o. male Seen to establish pacemaker followup. He underwent device implantation for sinus node dysfunction in the setting of chronic dizziness.    It also has a history of atrial fibrillation and hypertension. He is currently not on anticoagulation per Dr. Saundra Shelling.  He has chronic edema    Echocardiogram 4/14 demonstrated normal LV function left atrial enlargement Myoview scan 10/13 demonstrated an EF of 48-56% without evidence of reversible ischemia.   Past Medical History  Diagnosis Date  . Hypertension   . Hearing loss   . Pacemaker   . Leg swelling 10/19/2011    LLE  . COPD (chronic obstructive pulmonary disease) 10/19/2011    "touch"  . Exertional dyspnea 10/19/2011  . OSA on CPAP 10/19/2011    "sometimes wear CPAP"  . Prostate cancer     S/P radiation tx ~ 2008  . History of blood transfusion     "think it was related to pacemaker placement"  . GERD (gastroesophageal reflux disease)   . Arthritis     "back"  . Chronic lower back pain       Current Outpatient Prescriptions  Medication Sig Dispense Refill  . albuterol (PROAIR HFA) 108 (90 BASE) MCG/ACT inhaler Inhale 2 puffs into the lungs every 6 (six) hours as needed for wheezing or shortness of breath.  1 Inhaler  6  . ALPHA-LIPOIC ACID PO Take 400 mg by mouth daily.       Marland Kitchen aspirin EC 81 MG tablet Take 81 mg by mouth daily.      . calcium-vitamin D (OSCAL WITH D) 500-200 MG-UNIT per tablet Take 1 tablet by mouth daily with breakfast.       . darbepoetin alfa-polysorbate (ARANESP, ALB FREE, SURECLICK) 323 MCG/ML injection Inject 500 mcg into the skin as needed. Every 2 months      . folic acid (FOLVITE) 1 MG tablet Take 1 mg by mouth daily.      . furosemide (LASIX) 40 MG tablet Take 80 mg by mouth daily. Patient  takes an extra 40 mg PRN at noon      . lisinopril (PRINIVIL,ZESTRIL) 20 MG tablet Take 10 mg by mouth daily.      . Multiple Vitamins-Minerals (ICAPS PO) Take 2 capsules by mouth daily.       . Multiple Vitamins-Minerals (MULTIVITAMIN WITH MINERALS) tablet Take 1 tablet by mouth daily.        . Omega-3 Fatty Acids (FISH OIL) 1200 MG CAPS Take 1 capsule by mouth daily.        Marland Kitchen omeprazole (PRILOSEC) 20 MG capsule Take 20 mg by mouth daily.       Marland Kitchen tiotropium (SPIRIVA) 18 MCG inhalation capsule Place 18 mcg into inhaler and inhale daily.      . traMADol (ULTRAM) 50 MG tablet Take 50 mg by mouth every 6 (six) hours as needed.      . vitamin B-12 (CYANOCOBALAMIN) 500 MCG tablet Take 500 mcg by mouth daily.        No current facility-administered medications for this visit.    No Known Allergies  Review of Systems negative except from HPI and PMH  Physical Exam BP 142/73  Pulse 77  Ht 5\' 10"  (1.778 m)  Wt 185 lb (83.915 kg)  BMI 26.54 kg/m2 Well developed and nourished in no acute distress HENT normal Neck supple with JVP-flat Clear Regular rate and rhythm, no murmurs or gallops Abd-soft with active BS No Clubbing cyanosis edema Skin-warm and dry A & Oriented  Grossly normal sensory and motor function     Assessment and  Plan  Sinus node dysfunction  Pacemaker  [employer]  The patient's device was interrogated.  The information was reviewed. No changes were made in the programming.     Hypertension  Stable

## 2013-06-18 ENCOUNTER — Ambulatory Visit: Payer: Medicare Other | Admitting: Interventional Cardiology

## 2013-06-25 DIAGNOSIS — M7020 Olecranon bursitis, unspecified elbow: Secondary | ICD-10-CM | POA: Diagnosis not present

## 2013-06-29 DIAGNOSIS — H2640 Unspecified secondary cataract: Secondary | ICD-10-CM | POA: Diagnosis not present

## 2013-07-02 DIAGNOSIS — G609 Hereditary and idiopathic neuropathy, unspecified: Secondary | ICD-10-CM | POA: Diagnosis not present

## 2013-07-02 DIAGNOSIS — M47817 Spondylosis without myelopathy or radiculopathy, lumbosacral region: Secondary | ICD-10-CM | POA: Diagnosis not present

## 2013-07-02 DIAGNOSIS — G894 Chronic pain syndrome: Secondary | ICD-10-CM | POA: Diagnosis not present

## 2013-07-02 DIAGNOSIS — IMO0002 Reserved for concepts with insufficient information to code with codable children: Secondary | ICD-10-CM | POA: Diagnosis not present

## 2013-07-02 DIAGNOSIS — M1710 Unilateral primary osteoarthritis, unspecified knee: Secondary | ICD-10-CM | POA: Diagnosis not present

## 2013-07-12 DIAGNOSIS — S5000XA Contusion of unspecified elbow, initial encounter: Secondary | ICD-10-CM | POA: Diagnosis not present

## 2013-07-12 DIAGNOSIS — S40019A Contusion of unspecified shoulder, initial encounter: Secondary | ICD-10-CM | POA: Diagnosis not present

## 2013-07-26 DIAGNOSIS — L570 Actinic keratosis: Secondary | ICD-10-CM | POA: Diagnosis not present

## 2013-07-30 DIAGNOSIS — R269 Unspecified abnormalities of gait and mobility: Secondary | ICD-10-CM | POA: Diagnosis not present

## 2013-07-30 DIAGNOSIS — R252 Cramp and spasm: Secondary | ICD-10-CM | POA: Diagnosis not present

## 2013-07-30 DIAGNOSIS — R109 Unspecified abdominal pain: Secondary | ICD-10-CM | POA: Diagnosis not present

## 2013-07-30 DIAGNOSIS — G609 Hereditary and idiopathic neuropathy, unspecified: Secondary | ICD-10-CM | POA: Diagnosis not present

## 2013-07-30 DIAGNOSIS — Z79899 Other long term (current) drug therapy: Secondary | ICD-10-CM | POA: Diagnosis not present

## 2013-07-30 DIAGNOSIS — R634 Abnormal weight loss: Secondary | ICD-10-CM | POA: Diagnosis not present

## 2013-07-30 DIAGNOSIS — I10 Essential (primary) hypertension: Secondary | ICD-10-CM | POA: Diagnosis not present

## 2013-07-30 DIAGNOSIS — R0602 Shortness of breath: Secondary | ICD-10-CM | POA: Diagnosis not present

## 2013-08-07 ENCOUNTER — Ambulatory Visit (HOSPITAL_BASED_OUTPATIENT_CLINIC_OR_DEPARTMENT_OTHER): Payer: Medicare Other

## 2013-08-07 ENCOUNTER — Other Ambulatory Visit (HOSPITAL_BASED_OUTPATIENT_CLINIC_OR_DEPARTMENT_OTHER): Payer: Medicare Other

## 2013-08-07 VITALS — BP 145/62 | HR 84 | Temp 97.4°F | Resp 16

## 2013-08-07 DIAGNOSIS — R040 Epistaxis: Secondary | ICD-10-CM | POA: Diagnosis not present

## 2013-08-07 DIAGNOSIS — K432 Incisional hernia without obstruction or gangrene: Secondary | ICD-10-CM

## 2013-08-07 DIAGNOSIS — H6120 Impacted cerumen, unspecified ear: Secondary | ICD-10-CM | POA: Diagnosis not present

## 2013-08-07 DIAGNOSIS — D649 Anemia, unspecified: Secondary | ICD-10-CM

## 2013-08-07 DIAGNOSIS — D501 Sideropenic dysphagia: Secondary | ICD-10-CM

## 2013-08-07 DIAGNOSIS — D638 Anemia in other chronic diseases classified elsewhere: Secondary | ICD-10-CM

## 2013-08-07 DIAGNOSIS — J449 Chronic obstructive pulmonary disease, unspecified: Secondary | ICD-10-CM

## 2013-08-07 LAB — CBC WITH DIFFERENTIAL/PLATELET
BASO%: 0.4 % (ref 0.0–2.0)
Basophils Absolute: 0 10*3/uL (ref 0.0–0.1)
EOS ABS: 0.2 10*3/uL (ref 0.0–0.5)
EOS%: 3.2 % (ref 0.0–7.0)
HEMATOCRIT: 33.3 % — AB (ref 38.4–49.9)
HEMOGLOBIN: 10.6 g/dL — AB (ref 13.0–17.1)
LYMPH%: 21.7 % (ref 14.0–49.0)
MCH: 30 pg (ref 27.2–33.4)
MCHC: 31.9 g/dL — ABNORMAL LOW (ref 32.0–36.0)
MCV: 94.1 fL (ref 79.3–98.0)
MONO#: 0.3 10*3/uL (ref 0.1–0.9)
MONO%: 6.4 % (ref 0.0–14.0)
NEUT%: 68.3 % (ref 39.0–75.0)
NEUTROS ABS: 3.6 10*3/uL (ref 1.5–6.5)
Platelets: 122 10*3/uL — ABNORMAL LOW (ref 140–400)
RBC: 3.54 10*6/uL — ABNORMAL LOW (ref 4.20–5.82)
RDW: 15.7 % — ABNORMAL HIGH (ref 11.0–14.6)
WBC: 5.3 10*3/uL (ref 4.0–10.3)
lymph#: 1.2 10*3/uL (ref 0.9–3.3)

## 2013-08-07 MED ORDER — DARBEPOETIN ALFA-POLYSORBATE 300 MCG/0.6ML IJ SOLN
300.0000 ug | Freq: Once | INTRAMUSCULAR | Status: AC
  Administered 2013-08-07: 300 ug via SUBCUTANEOUS
  Filled 2013-08-07: qty 0.6

## 2013-08-07 NOTE — Patient Instructions (Signed)
Darbepoetin Alfa injection What is this medicine? DARBEPOETIN ALFA (dar be POE e tin AL fa) helps your body make more red blood cells. It is used to treat anemia caused by chronic kidney failure and chemotherapy. This medicine may be used for other purposes; ask your health care provider or pharmacist if you have questions. COMMON BRAND NAME(S): Aranesp What should I tell my health care provider before I take this medicine? They need to know if you have any of these conditions: -blood clotting disorders or history of blood clots -cancer patient not on chemotherapy -cystic fibrosis -heart disease, such as angina, heart failure, or a history of a heart attack -hemoglobin level of 12 g/dL or greater -high blood pressure -low levels of folate, iron, or vitamin B12 -seizures -an unusual or allergic reaction to darbepoetin, erythropoietin, albumin, hamster proteins, latex, other medicines, foods, dyes, or preservatives -pregnant or trying to get pregnant -breast-feeding How should I use this medicine? This medicine is for injection into a vein or under the skin. It is usually given by a health care professional in a hospital or clinic setting. If you get this medicine at home, you will be taught how to prepare and give this medicine. Do not shake the solution before you withdraw a dose. Use exactly as directed. Take your medicine at regular intervals. Do not take your medicine more often than directed. It is important that you put your used needles and syringes in a special sharps container. Do not put them in a trash can. If you do not have a sharps container, call your pharmacist or healthcare provider to get one. Talk to your pediatrician regarding the use of this medicine in children. While this medicine may be used in children as young as 1 year for selected conditions, precautions do apply. Overdosage: If you think you have taken too much of this medicine contact a poison control center or  emergency room at once. NOTE: This medicine is only for you. Do not share this medicine with others. What if I miss a dose? If you miss a dose, take it as soon as you can. If it is almost time for your next dose, take only that dose. Do not take double or extra doses. What may interact with this medicine? Do not take this medicine with any of the following medications: -epoetin alfa This list may not describe all possible interactions. Give your health care provider a list of all the medicines, herbs, non-prescription drugs, or dietary supplements you use. Also tell them if you smoke, drink alcohol, or use illegal drugs. Some items may interact with your medicine. What should I watch for while using this medicine? Visit your prescriber or health care professional for regular checks on your progress and for the needed blood tests and blood pressure measurements. It is especially important for the doctor to make sure your hemoglobin level is in the desired range, to limit the risk of potential side effects and to give you the best benefit. Keep all appointments for any recommended tests. Check your blood pressure as directed. Ask your doctor what your blood pressure should be and when you should contact him or her. As your body makes more red blood cells, you may need to take iron, folic acid, or vitamin B supplements. Ask your doctor or health care provider which products are right for you. If you have kidney disease continue dietary restrictions, even though this medication can make you feel better. Talk with your doctor or health   care professional about the foods you eat and the vitamins that you take. What side effects may I notice from receiving this medicine? Side effects that you should report to your doctor or health care professional as soon as possible: -allergic reactions like skin rash, itching or hives, swelling of the face, lips, or tongue -breathing problems -changes in vision -chest  pain -confusion, trouble speaking or understanding -feeling faint or lightheaded, falls -high blood pressure -muscle aches or pains -pain, swelling, warmth in the leg -rapid weight gain -severe headaches -sudden numbness or weakness of the face, arm or leg -trouble walking, dizziness, loss of balance or coordination -seizures (convulsions) -swelling of the ankles, feet, hands -unusually weak or tired Side effects that usually do not require medical attention (report to your doctor or health care professional if they continue or are bothersome): -diarrhea -fever, chills (flu-like symptoms) -headaches -nausea, vomiting -redness, stinging, or swelling at site where injected This list may not describe all possible side effects. Call your doctor for medical advice about side effects. You may report side effects to FDA at 1-800-FDA-1088. Where should I keep my medicine? Keep out of the reach of children. Store in a refrigerator between 2 and 8 degrees C (36 and 46 degrees F). Do not freeze. Do not shake. Throw away any unused portion if using a single-dose vial. Throw away any unused medicine after the expiration date. NOTE: This sheet is a summary. It may not cover all possible information. If you have questions about this medicine, talk to your doctor, pharmacist, or health care provider.  2015, Elsevier/Gold Standard. (2008-01-16 10:23:57)  

## 2013-08-13 ENCOUNTER — Ambulatory Visit (INDEPENDENT_AMBULATORY_CARE_PROVIDER_SITE_OTHER): Payer: Medicare Other | Admitting: Podiatry

## 2013-08-13 ENCOUNTER — Encounter: Payer: Self-pay | Admitting: Podiatry

## 2013-08-13 VITALS — BP 136/72 | HR 80 | Resp 16

## 2013-08-13 DIAGNOSIS — B351 Tinea unguium: Secondary | ICD-10-CM | POA: Diagnosis not present

## 2013-08-13 NOTE — Progress Notes (Signed)
Subjective:     Patient ID: [name] [name], male   DOB: 1924-04-24, 80 y.o.   MRN: [id number]  HPI patient has a damaged left hallux nail bed is partially fallen off and thickness in the right hallux nail he's concerned about   Review of Systems     Objective:   Physical Exam Neurovascular status unchanged patient well oriented with a damaged left hallux nail that is partially off and in danger of infection    Assessment:     Damage hallux nail left secondary to probable trauma    Plan:     Reviewed condition infiltrated the left hallux 60 mg Xylocaine Marcaine mixture remove the left hallux nail and allowing new nail to regrow and applied sterile dressing. Instructed on soaks and reappoint

## 2013-08-21 ENCOUNTER — Ambulatory Visit: Payer: Medicare Other | Admitting: Oncology

## 2013-08-27 DIAGNOSIS — G894 Chronic pain syndrome: Secondary | ICD-10-CM | POA: Diagnosis not present

## 2013-08-27 DIAGNOSIS — M47817 Spondylosis without myelopathy or radiculopathy, lumbosacral region: Secondary | ICD-10-CM | POA: Diagnosis not present

## 2013-09-13 ENCOUNTER — Telehealth: Payer: Self-pay | Admitting: Interventional Cardiology

## 2013-09-13 ENCOUNTER — Encounter: Payer: Medicare Other | Admitting: *Deleted

## 2013-09-13 NOTE — Telephone Encounter (Signed)
LMOVM stating remote not received yet. Gave pt [employer] #.

## 2013-09-13 NOTE — Telephone Encounter (Signed)
New message          Pt would like to know if you received his transmission

## 2013-09-18 NOTE — Telephone Encounter (Signed)
Called pt again. He states he called [employer]. Their recommendation was to wait longer for the transmission to send. Pt stated he waited 65min on initial attempt. Pt will try again this morning.

## 2013-09-19 ENCOUNTER — Encounter: Payer: Self-pay | Admitting: *Deleted

## 2013-09-19 NOTE — Telephone Encounter (Signed)
Missed remote letter mailed.

## 2013-09-24 ENCOUNTER — Telehealth: Payer: Self-pay | Admitting: *Deleted

## 2013-09-24 NOTE — Telephone Encounter (Signed)
Pt has attempted to send transmissions from every working phone jack at home. Pt also tried both outgoing phone ports on his [employer] monitor. All permutations have not succeeded. Pt contacted [employer] but did not have success. New [employer] w/ return kit was ordered.

## 2013-10-02 ENCOUNTER — Telehealth: Payer: Self-pay | Admitting: Oncology

## 2013-10-02 ENCOUNTER — Ambulatory Visit (HOSPITAL_BASED_OUTPATIENT_CLINIC_OR_DEPARTMENT_OTHER): Payer: Medicare Other | Admitting: Oncology

## 2013-10-02 ENCOUNTER — Encounter: Payer: Self-pay | Admitting: Oncology

## 2013-10-02 ENCOUNTER — Ambulatory Visit (HOSPITAL_BASED_OUTPATIENT_CLINIC_OR_DEPARTMENT_OTHER): Payer: Medicare Other

## 2013-10-02 ENCOUNTER — Other Ambulatory Visit (HOSPITAL_BASED_OUTPATIENT_CLINIC_OR_DEPARTMENT_OTHER): Payer: Medicare Other

## 2013-10-02 VITALS — BP 144/62 | HR 84 | Temp 98.2°F | Resp 18 | Ht 70.0 in | Wt 187.9 lb

## 2013-10-02 DIAGNOSIS — D638 Anemia in other chronic diseases classified elsewhere: Secondary | ICD-10-CM

## 2013-10-02 DIAGNOSIS — N289 Disorder of kidney and ureter, unspecified: Secondary | ICD-10-CM | POA: Diagnosis not present

## 2013-10-02 DIAGNOSIS — Z8546 Personal history of malignant neoplasm of prostate: Secondary | ICD-10-CM | POA: Diagnosis not present

## 2013-10-02 DIAGNOSIS — D649 Anemia, unspecified: Secondary | ICD-10-CM

## 2013-10-02 DIAGNOSIS — Z923 Personal history of irradiation: Secondary | ICD-10-CM | POA: Diagnosis not present

## 2013-10-02 DIAGNOSIS — D508 Other iron deficiency anemias: Secondary | ICD-10-CM

## 2013-10-02 LAB — CBC WITH DIFFERENTIAL/PLATELET
BASO%: 0.5 % (ref 0.0–2.0)
Basophils Absolute: 0 10*3/uL (ref 0.0–0.1)
EOS ABS: 0.1 10*3/uL (ref 0.0–0.5)
EOS%: 2.8 % (ref 0.0–7.0)
HCT: 32.3 % — ABNORMAL LOW (ref 38.4–49.9)
HGB: 10.4 g/dL — ABNORMAL LOW (ref 13.0–17.1)
LYMPH%: 20.8 % (ref 14.0–49.0)
MCH: 30.3 pg (ref 27.2–33.4)
MCHC: 32.2 g/dL (ref 32.0–36.0)
MCV: 94.2 fL (ref 79.3–98.0)
MONO#: 0.3 10*3/uL (ref 0.1–0.9)
MONO%: 6.7 % (ref 0.0–14.0)
NEUT#: 3.4 10*3/uL (ref 1.5–6.5)
NEUT%: 69.2 % (ref 39.0–75.0)
PLATELETS: 126 10*3/uL — AB (ref 140–400)
RBC: 3.43 10*6/uL — AB (ref 4.20–5.82)
RDW: 15.6 % — AB (ref 11.0–14.6)
WBC: 5 10*3/uL (ref 4.0–10.3)
lymph#: 1 10*3/uL (ref 0.9–3.3)

## 2013-10-02 MED ORDER — DARBEPOETIN ALFA-POLYSORBATE 300 MCG/0.6ML IJ SOLN
300.0000 ug | Freq: Once | INTRAMUSCULAR | Status: AC
  Administered 2013-10-02: 300 ug via SUBCUTANEOUS
  Filled 2013-10-02: qty 0.6

## 2013-10-02 NOTE — Progress Notes (Signed)
Hematology and Oncology Follow Up Visit  [name] [name] [id number] 1924/09/02 80 y.o. 10/02/2013 10:44 AM  CC: Hal T. Felipa Eth, M.D.  Thana Farr. Karsten Ro, M.D.    Principle Diagnosis: 80 year old gentleman with the following diagnoses. 1. Multifactorial anemia.  He has anemia of chronic disease.  It may be anemia of renal insufficiency. 2. History of prostate cancer status post radiation therapy completed in 2008.  Current therapy: He is on Aranesp 300 mcg subcutaneously every 8 weeks to keep his hemoglobin above 11.  Interim History: [name] [name] presents today for a followup visit.  Since his last visit, he reports no complications. He has been receiving Aranesp every 2 months and had been doing relatively well with it.  He had not had any complications associated with it.  His performance status is stable and still able to work part time. He continues to be relatively active without any decline. He has not reported any headaches or blurry vision or syncope. He reports no chest pain or palpitation. He has not reported any wheezing or shortness of breath. Does have coronary nausea or vomiting or abdominal pain. Rest of his review of systems unremarkable.   Medications: I have reviewed the patient's current medications. Current Outpatient Prescriptions  Medication Sig Dispense Refill  . albuterol (PROAIR HFA) 108 (90 BASE) MCG/ACT inhaler Inhale 2 puffs into the lungs every 6 (six) hours as needed for wheezing or shortness of breath.  1 Inhaler  6  . ALPHA-LIPOIC ACID PO Take 400 mg by mouth daily.       Marland Kitchen aspirin EC 81 MG tablet Take 81 mg by mouth daily.      . calcium-vitamin D (OSCAL WITH D) 500-200 MG-UNIT per tablet Take 1 tablet by mouth daily with breakfast.       . darbepoetin alfa-polysorbate (ARANESP, ALB FREE, SURECLICK) 623 MCG/ML injection Inject 500 mcg into the skin as needed. Every 2 months      . folic acid (FOLVITE) 1 MG tablet Take 1 mg by mouth daily.      . furosemide  (LASIX) 40 MG tablet Take 80 mg by mouth daily. Patient takes an extra 40 mg PRN at noon      . lisinopril (PRINIVIL,ZESTRIL) 20 MG tablet Take 10 mg by mouth daily.      . Multiple Vitamins-Minerals (ICAPS PO) Take 2 capsules by mouth daily.       . Multiple Vitamins-Minerals (MULTIVITAMIN WITH MINERALS) tablet Take 1 tablet by mouth daily.        . Omega-3 Fatty Acids (FISH OIL) 1200 MG CAPS Take 1 capsule by mouth daily.        Marland Kitchen omeprazole (PRILOSEC) 20 MG capsule Take 20 mg by mouth daily.       Marland Kitchen tiotropium (SPIRIVA) 18 MCG inhalation capsule Place 18 mcg into inhaler and inhale daily.      . traMADol (ULTRAM) 50 MG tablet Take 50 mg by mouth every 6 (six) hours as needed.      . vitamin B-12 (CYANOCOBALAMIN) 500 MCG tablet Take 500 mcg by mouth daily.        No current facility-administered medications for this visit.   Facility-Administered Medications Ordered in Other Visits  Medication Dose Route Frequency Provider Last Rate Last Dose  . darbepoetin (ARANESP) injection 300 mcg  300 mcg Subcutaneous Once Maryanna Shape, NP        Allergies: No Known Allergies  Past Medical History, Surgical history, Social history, and Family History were  reviewed and updated.    Physical Exam: Blood pressure 144/62, pulse 84, temperature 98.2 F (36.8 C), temperature source Oral, resp. rate 18, height 5\' 10"  (1.778 m), weight 187 lb 14.4 oz (85.231 kg), SpO2 100.00%. ECOG: 1 General appearance: alert Head: Normocephalic, without obvious abnormality, atraumatic Neck: no adenopathy Lymph nodes: Cervical, supraclavicular, and axillary nodes normal. Heart:regular rate and rhythm, S1, S2 normal, no murmur, click, rub or gallop Lung:chest clear, no wheezing, rales, normal symmetric air entry Abdomen: soft, non-tender, without masses or organomegaly EXT:no erythema, induration, or nodules   Lab Results: Lab Results  Component Value Date   WBC 5.0 10/02/2013   HGB 10.4* 10/02/2013   HCT  32.3* 10/02/2013   MCV 94.2 10/02/2013   PLT 126* 10/02/2013     Chemistry      Component Value Date/Time   NA 134* 10/23/2012 0419   K 4.1 10/23/2012 0419   CL 103 10/23/2012 0419   CO2 27 10/23/2012 0419   BUN 15 10/23/2012 0419   CREATININE 0.81 10/23/2012 0419      Component Value Date/Time   CALCIUM 8.0* 10/23/2012 0419   ALKPHOS 83 10/17/2012 1555   AST 21 10/17/2012 1555   ALT 11 10/17/2012 1555   BILITOT 0.4 10/17/2012 1555       Impression and Plan:  80 year old gentleman with the following issues. 1. Multifactorial anemia.  There is an element of anemia of renal insufficiency.  Currently on Aranesp 300 mcg to keep his hemoglobin above 11. Hemoglobin is 10.4 and he will receive Aranesp today. We will continue.  to check him every 2 months for a possible injection. 2. Prostate cancer.  No relapse at this point. He sees Dr Karsten Ro in October  for routine follow-up. last PSA continues to be close to 0.  3. Follow-up. Aranesp injection every 8 weeks. Visit in 6 months.  [name] [name] 8/18/201510:44 AM

## 2013-10-02 NOTE — Telephone Encounter (Signed)
gv adn printed appt sched and avs for pt for OCT, Dec adn Feb 2016

## 2013-10-26 ENCOUNTER — Encounter: Payer: Self-pay | Admitting: Interventional Cardiology

## 2013-10-26 ENCOUNTER — Ambulatory Visit (INDEPENDENT_AMBULATORY_CARE_PROVIDER_SITE_OTHER): Payer: Medicare Other | Admitting: Interventional Cardiology

## 2013-10-26 VITALS — BP 132/70 | HR 58 | Ht 69.0 in | Wt 182.0 lb

## 2013-10-26 DIAGNOSIS — I10 Essential (primary) hypertension: Secondary | ICD-10-CM | POA: Diagnosis not present

## 2013-10-26 DIAGNOSIS — R0989 Other specified symptoms and signs involving the circulatory and respiratory systems: Secondary | ICD-10-CM

## 2013-10-26 DIAGNOSIS — R0609 Other forms of dyspnea: Secondary | ICD-10-CM | POA: Diagnosis not present

## 2013-10-26 DIAGNOSIS — Z950 Presence of cardiac pacemaker: Secondary | ICD-10-CM | POA: Diagnosis not present

## 2013-10-26 DIAGNOSIS — I480 Paroxysmal atrial fibrillation: Secondary | ICD-10-CM

## 2013-10-26 DIAGNOSIS — I4891 Unspecified atrial fibrillation: Secondary | ICD-10-CM | POA: Diagnosis not present

## 2013-10-26 NOTE — Patient Instructions (Signed)
Your physician recommends that you continue on your current medications as directed. Please refer to the Current Medication list given to you today.  Your physician wants you to follow-up in: 1 year with Dr. Varanasi. You will receive a reminder letter in the mail two months in advance. If you don't receive a letter, please call our office to schedule the follow-up appointment.  

## 2013-10-26 NOTE — Progress Notes (Signed)
Patient ID: [name] [name], male   DOB: 12/25/1924, 80 y.o.   MRN: [id number]    [place], [address], [place]  [address] Phone: [phone] Fax:  [phone]  Date:  10/26/2013   ID:  FAARIS ARIZPE, DOB 26-Feb-1924, MRN [id number]  PCP:  Mathews [place], MD      History of Present Illness: KOLTYN KELSAY is a 80 y.o. male who had gallstone pancreatitis and subsequent cholecystectomy over a year ago in 2013.  He has a h/o PAF and HTN.  He had swelling and dizziness but these are improved. He stopped potassium due to burning in his esophagus. It resolved off of the potassium. LE swelling, improved on Lasix therapy. Occasional afternoon lasix if swelling worse. No chest pain. No palpitations. Occasional DOE. Still active in the yard. BP has been in the 503-546 range systolic. Lowest was 568 systolic.  He feels that he is more sleepy.  He uses CPAP.  Overall, he feels more tired but is able to get done the things he needs done.   Lost a little weight without trying.  Home BPs reviewed.  Typically in the 127N systolic.     Wt Readings from Last 3 Encounters:  10/26/13 182 lb (82.555 kg)  10/02/13 187 lb 14.4 oz (85.231 kg)  06/14/13 185 lb (83.915 kg)     Past Medical History  Diagnosis Date  . Hypertension   . Hearing loss   . Pacemaker   . Leg swelling 10/19/2011    LLE  . COPD (chronic obstructive pulmonary disease) 10/19/2011    "touch"  . Exertional dyspnea 10/19/2011  . OSA on CPAP 10/19/2011    "sometimes wear CPAP"  . Prostate cancer     S/P radiation tx ~ 2008  . History of blood transfusion     "think it was related to pacemaker placement"  . GERD (gastroesophageal reflux disease)   . Arthritis     "back"  . Chronic lower back pain     Current Outpatient Prescriptions  Medication Sig Dispense Refill  . ALPHA-LIPOIC ACID PO Take 400 mg by mouth daily.       Marland Kitchen aspirin EC 81 MG tablet Take 81 mg by mouth daily.      . calcium-vitamin D (OSCAL  WITH D) 500-200 MG-UNIT per tablet Take 1 tablet by mouth daily with breakfast.       . darbepoetin alfa-polysorbate (ARANESP, ALB FREE, SURECLICK) 170 MCG/ML injection Inject 500 mcg into the skin as needed. Every 2 months      . folic acid (FOLVITE) 1 MG tablet Take 1 mg by mouth daily.      . furosemide (LASIX) 40 MG tablet Take 80 mg by mouth daily. Patient takes an extra 40 mg PRN at noon      . lisinopril (PRINIVIL,ZESTRIL) 20 MG tablet Take 10 mg by mouth daily.      . Multiple Vitamins-Minerals (ICAPS PO) Take 2 capsules by mouth daily.       . Multiple Vitamins-Minerals (MULTIVITAMIN WITH MINERALS) tablet Take 1 tablet by mouth daily.        . Omega-3 Fatty Acids (FISH OIL) 1200 MG CAPS Take 1 capsule by mouth daily.        Marland Kitchen omeprazole (PRILOSEC) 20 MG capsule Take 20 mg by mouth daily.       . traMADol (ULTRAM) 50 MG tablet Take 50 mg by mouth every 6 (six) hours as needed.      Marland Kitchen  vitamin B-12 (CYANOCOBALAMIN) 500 MCG tablet Take 500 mcg by mouth daily.        No current facility-administered medications for this visit.    Allergies:   No Known Allergies  Social History:  The patient  reports that he quit smoking about 40 years ago. His smoking use included Cigarettes. He has a 52.5 pack-year smoking history. He has never used smokeless tobacco. He reports that he does not drink alcohol or use illicit drugs.   Family History:  The patient's family history includes Colon cancer in his mother; Heart disease in his father and mother.   ROS:  Please see the history of present illness.  No nausea, vomiting.  No fevers, chills.  No focal weakness.  No dysuria.    All other systems reviewed and negative.   PHYSICAL EXAM: VS:  BP 132/70  Pulse 58  Ht 5\' 9"  (1.753 m)  Wt 182 lb (82.555 kg)  BMI 26.86 kg/m2 Well nourished, well developed, in no acute distress HEENT: normal Neck: no JVD, no carotid bruits Cardiac:  normal S1, S2; RRR;  Lungs:  clear to auscultation bilaterally, no  wheezing, rhonchi or rales Abd: soft, nontender, no hepatomegaly Ext: no edema Skin: warm and dry Neuro:   no focal abnormalities noted  EKG:  5/15: NSR, premature beats     ASSESSMENT AND PLAN:  Shortness of breath /DOE Notes: Stable. Continue Lasix  40 mg daily. Feels better than he did after surgery in 2013. Chronic   2. Cardiac pacemaker in situ  Notes: Routine pacer checks.    3. Essential hypertension, benign  Continue Lisinopril Tablet, 20 MG, 1/2 tablet, Orally, Once a day Notes: COntrolled at home.   4. Bilateral leg edema  Notes: Improved. Elevate legs when possible, ideally with the ankles above the level of the heart.    5. Atrial fibrillation  Notes: He had paroxysmal atrial fibrillation in the hospital. Echocardiogram then revealed mid to basal inferior wall hypokinesis. Maintaining NSR. WOuld hold off on coumadin at this time.    Needs lipids checked. Will f/u with PCP.   Preventive Medicine  Adult topics discussed:  Diet: healthy diet.  Exercise: 5 days a week, at least 30 minutes of aerobic exercise.      Signed, Mina Marble, MD, [facility] 10/26/2013 1:45 PM

## 2013-10-29 DIAGNOSIS — M47817 Spondylosis without myelopathy or radiculopathy, lumbosacral region: Secondary | ICD-10-CM | POA: Diagnosis not present

## 2013-10-29 DIAGNOSIS — G894 Chronic pain syndrome: Secondary | ICD-10-CM | POA: Diagnosis not present

## 2013-10-29 DIAGNOSIS — G609 Hereditary and idiopathic neuropathy, unspecified: Secondary | ICD-10-CM | POA: Diagnosis not present

## 2013-11-12 DIAGNOSIS — N183 Chronic kidney disease, stage 3 unspecified: Secondary | ICD-10-CM | POA: Diagnosis not present

## 2013-11-12 DIAGNOSIS — R109 Unspecified abdominal pain: Secondary | ICD-10-CM | POA: Diagnosis not present

## 2013-11-12 DIAGNOSIS — I129 Hypertensive chronic kidney disease with stage 1 through stage 4 chronic kidney disease, or unspecified chronic kidney disease: Secondary | ICD-10-CM | POA: Diagnosis not present

## 2013-11-15 DIAGNOSIS — M7551 Bursitis of right shoulder: Secondary | ICD-10-CM | POA: Diagnosis not present

## 2013-11-15 DIAGNOSIS — M75111 Incomplete rotator cuff tear or rupture of right shoulder, not specified as traumatic: Secondary | ICD-10-CM | POA: Diagnosis not present

## 2013-11-27 ENCOUNTER — Ambulatory Visit (HOSPITAL_BASED_OUTPATIENT_CLINIC_OR_DEPARTMENT_OTHER): Payer: Medicare Other

## 2013-11-27 ENCOUNTER — Other Ambulatory Visit (HOSPITAL_BASED_OUTPATIENT_CLINIC_OR_DEPARTMENT_OTHER): Payer: Medicare Other

## 2013-11-27 VITALS — BP 123/92 | HR 86 | Temp 97.5°F

## 2013-11-27 DIAGNOSIS — N289 Disorder of kidney and ureter, unspecified: Secondary | ICD-10-CM

## 2013-11-27 DIAGNOSIS — D638 Anemia in other chronic diseases classified elsewhere: Secondary | ICD-10-CM

## 2013-11-27 DIAGNOSIS — D508 Other iron deficiency anemias: Secondary | ICD-10-CM

## 2013-11-27 LAB — CBC WITH DIFFERENTIAL/PLATELET
BASO%: 1.5 % (ref 0.0–2.0)
Basophils Absolute: 0.1 10*3/uL (ref 0.0–0.1)
EOS ABS: 0.3 10*3/uL (ref 0.0–0.5)
EOS%: 4.9 % (ref 0.0–7.0)
HCT: 33.1 % — ABNORMAL LOW (ref 38.4–49.9)
HGB: 10.7 g/dL — ABNORMAL LOW (ref 13.0–17.1)
LYMPH%: 21 % (ref 14.0–49.0)
MCH: 30.1 pg (ref 27.2–33.4)
MCHC: 32.3 g/dL (ref 32.0–36.0)
MCV: 93.1 fL (ref 79.3–98.0)
MONO#: 0.4 10*3/uL (ref 0.1–0.9)
MONO%: 6.4 % (ref 0.0–14.0)
NEUT%: 66.2 % (ref 39.0–75.0)
NEUTROS ABS: 4 10*3/uL (ref 1.5–6.5)
PLATELETS: 128 10*3/uL — AB (ref 140–400)
RBC: 3.55 10*6/uL — AB (ref 4.20–5.82)
RDW: 14.7 % — AB (ref 11.0–14.6)
WBC: 6.1 10*3/uL (ref 4.0–10.3)
lymph#: 1.3 10*3/uL (ref 0.9–3.3)

## 2013-11-27 MED ORDER — DARBEPOETIN ALFA-POLYSORBATE 300 MCG/0.6ML IJ SOLN
300.0000 ug | Freq: Once | INTRAMUSCULAR | Status: AC
  Administered 2013-11-27: 300 ug via SUBCUTANEOUS
  Filled 2013-11-27: qty 0.6

## 2013-12-03 ENCOUNTER — Encounter: Payer: Self-pay | Admitting: Pulmonary Disease

## 2013-12-03 ENCOUNTER — Ambulatory Visit (INDEPENDENT_AMBULATORY_CARE_PROVIDER_SITE_OTHER): Payer: Medicare Other | Admitting: Pulmonary Disease

## 2013-12-03 VITALS — BP 124/76 | HR 79 | Temp 98.0°F | Ht 70.0 in | Wt 191.4 lb

## 2013-12-03 DIAGNOSIS — M75111 Incomplete rotator cuff tear or rupture of right shoulder, not specified as traumatic: Secondary | ICD-10-CM | POA: Diagnosis not present

## 2013-12-03 DIAGNOSIS — S41102A Unspecified open wound of left upper arm, initial encounter: Secondary | ICD-10-CM | POA: Diagnosis not present

## 2013-12-03 DIAGNOSIS — J438 Other emphysema: Secondary | ICD-10-CM | POA: Diagnosis not present

## 2013-12-03 DIAGNOSIS — M7551 Bursitis of right shoulder: Secondary | ICD-10-CM | POA: Diagnosis not present

## 2013-12-03 DIAGNOSIS — Z23 Encounter for immunization: Secondary | ICD-10-CM | POA: Diagnosis not present

## 2013-12-03 NOTE — Patient Instructions (Signed)
Continue on your anoro Stay as active as possible. Make sure you get your flu shot this month.  followup with me again in 42mos.

## 2013-12-03 NOTE — Progress Notes (Signed)
   Subjective:    Patient ID: [name] [name], male    DOB: [birth year], 80 y.o.   MRN: [id number]  HPI Patient comes in today for followup of his known COPD. He is staying on the anoro, and the [facility] is now caring this medication. He feels that his breathing is at baseline, and has not had a recent acute exacerbation. He continues to be active, but his balance is giving him issues.   Review of Systems  Constitutional: Negative for fever and unexpected weight change.  HENT: Negative for congestion, dental problem, ear pain, nosebleeds, postnasal drip, rhinorrhea, sinus pressure, sneezing, sore throat and trouble swallowing.   Eyes: Negative for redness and itching.  Respiratory: Positive for shortness of breath. Negative for cough, chest tightness and wheezing.   Cardiovascular: Negative for palpitations and leg swelling.  Gastrointestinal: Negative for nausea and vomiting.  Genitourinary: Negative for dysuria.  Musculoskeletal: Negative for joint swelling.  Skin: Negative for rash.  Neurological: Negative for headaches.  Hematological: Does not bruise/bleed easily.  Psychiatric/Behavioral: Negative for dysphoric mood. The patient is not nervous/anxious.        Objective:   Physical Exam Well-developed male in no acute distress Nose without purulence or discharge noted Neck without lymphadenopathy or thyromegaly Chest with mildly decreased breath sounds, no wheeze Cardiac exam with regular rate and rhythm Lower extremities without edema, no cyanosis Alert and oriented, moves all 4 extremities. + Balance issues getting on the exam table.       Assessment & Plan:

## 2013-12-03 NOTE — Assessment & Plan Note (Signed)
The patient feels he is doing fairly well on anoro, and I've asked him to continue on this. I've also encouraged him to continue staying active.  I will see him back in 6 months if doing well.

## 2013-12-06 DIAGNOSIS — S41102A Unspecified open wound of left upper arm, initial encounter: Secondary | ICD-10-CM | POA: Diagnosis not present

## 2013-12-13 ENCOUNTER — Telehealth: Payer: Self-pay | Admitting: *Deleted

## 2013-12-13 NOTE — Telephone Encounter (Signed)
LMOVM returning pt's call. Left my direct #.

## 2013-12-19 NOTE — Telephone Encounter (Signed)
Spoke w/ pt's spouse. She states pt fell and has bruising at device pocket. I requested them to send a remote transmission. Spouse will have pt call me when he returns home.

## 2013-12-20 NOTE — Telephone Encounter (Signed)
Remote received. All diagnostics normal. Updated pt.

## 2013-12-20 NOTE — Telephone Encounter (Signed)
Pt returned my call. He will send an extra remote.

## 2013-12-25 DIAGNOSIS — G603 Idiopathic progressive neuropathy: Secondary | ICD-10-CM | POA: Diagnosis not present

## 2013-12-25 DIAGNOSIS — G894 Chronic pain syndrome: Secondary | ICD-10-CM | POA: Diagnosis not present

## 2013-12-25 DIAGNOSIS — M47816 Spondylosis without myelopathy or radiculopathy, lumbar region: Secondary | ICD-10-CM | POA: Diagnosis not present

## 2013-12-25 DIAGNOSIS — Z79891 Long term (current) use of opiate analgesic: Secondary | ICD-10-CM | POA: Diagnosis not present

## 2014-01-15 DIAGNOSIS — S41102A Unspecified open wound of left upper arm, initial encounter: Secondary | ICD-10-CM | POA: Diagnosis not present

## 2014-01-21 DIAGNOSIS — Z8546 Personal history of malignant neoplasm of prostate: Secondary | ICD-10-CM | POA: Diagnosis not present

## 2014-01-21 DIAGNOSIS — N401 Enlarged prostate with lower urinary tract symptoms: Secondary | ICD-10-CM | POA: Diagnosis not present

## 2014-01-22 ENCOUNTER — Ambulatory Visit (HOSPITAL_BASED_OUTPATIENT_CLINIC_OR_DEPARTMENT_OTHER): Payer: Medicare Other

## 2014-01-22 ENCOUNTER — Other Ambulatory Visit (HOSPITAL_BASED_OUTPATIENT_CLINIC_OR_DEPARTMENT_OTHER): Payer: Medicare Other

## 2014-01-22 DIAGNOSIS — D508 Other iron deficiency anemias: Secondary | ICD-10-CM

## 2014-01-22 DIAGNOSIS — N289 Disorder of kidney and ureter, unspecified: Secondary | ICD-10-CM | POA: Diagnosis not present

## 2014-01-22 DIAGNOSIS — J449 Chronic obstructive pulmonary disease, unspecified: Secondary | ICD-10-CM

## 2014-01-22 DIAGNOSIS — D649 Anemia, unspecified: Secondary | ICD-10-CM

## 2014-01-22 DIAGNOSIS — K432 Incisional hernia without obstruction or gangrene: Secondary | ICD-10-CM

## 2014-01-22 LAB — CBC WITH DIFFERENTIAL/PLATELET
BASO%: 0.3 % (ref 0.0–2.0)
Basophils Absolute: 0 10*3/uL (ref 0.0–0.1)
EOS%: 6.8 % (ref 0.0–7.0)
Eosinophils Absolute: 0.5 10*3/uL (ref 0.0–0.5)
HEMATOCRIT: 32.7 % — AB (ref 38.4–49.9)
HGB: 10.3 g/dL — ABNORMAL LOW (ref 13.0–17.1)
LYMPH%: 17.9 % (ref 14.0–49.0)
MCH: 30 pg (ref 27.2–33.4)
MCHC: 31.5 g/dL — AB (ref 32.0–36.0)
MCV: 95.3 fL (ref 79.3–98.0)
MONO#: 0.4 10*3/uL (ref 0.1–0.9)
MONO%: 5.9 % (ref 0.0–14.0)
NEUT#: 4.6 10*3/uL (ref 1.5–6.5)
NEUT%: 69.1 % (ref 39.0–75.0)
PLATELETS: 143 10*3/uL (ref 140–400)
RBC: 3.43 10*6/uL — AB (ref 4.20–5.82)
RDW: 14.8 % — ABNORMAL HIGH (ref 11.0–14.6)
WBC: 6.6 10*3/uL (ref 4.0–10.3)
lymph#: 1.2 10*3/uL (ref 0.9–3.3)

## 2014-01-22 MED ORDER — DARBEPOETIN ALFA 300 MCG/0.6ML IJ SOSY
300.0000 ug | PREFILLED_SYRINGE | Freq: Once | INTRAMUSCULAR | Status: AC
  Administered 2014-01-22: 300 ug via SUBCUTANEOUS
  Filled 2014-01-22: qty 0.6

## 2014-01-22 NOTE — Patient Instructions (Signed)
Darbepoetin Alfa injection What is this medicine? DARBEPOETIN ALFA (dar be POE e tin AL fa) helps your body make more red blood cells. It is used to treat anemia caused by chronic kidney failure and chemotherapy. This medicine may be used for other purposes; ask your health care provider or pharmacist if you have questions. COMMON BRAND NAME(S): Aranesp What should I tell my health care provider before I take this medicine? They need to know if you have any of these conditions: -blood clotting disorders or history of blood clots -cancer patient not on chemotherapy -cystic fibrosis -heart disease, such as angina, heart failure, or a history of a heart attack -hemoglobin level of 12 g/dL or greater -high blood pressure -low levels of folate, iron, or vitamin B12 -seizures -an unusual or allergic reaction to darbepoetin, erythropoietin, albumin, hamster proteins, latex, other medicines, foods, dyes, or preservatives -pregnant or trying to get pregnant -breast-feeding How should I use this medicine? This medicine is for injection into a vein or under the skin. It is usually given by a health care professional in a hospital or clinic setting. If you get this medicine at home, you will be taught how to prepare and give this medicine. Do not shake the solution before you withdraw a dose. Use exactly as directed. Take your medicine at regular intervals. Do not take your medicine more often than directed. It is important that you put your used needles and syringes in a special sharps container. Do not put them in a trash can. If you do not have a sharps container, call your pharmacist or healthcare provider to get one. Talk to your pediatrician regarding the use of this medicine in children. While this medicine may be used in children as young as 1 year for selected conditions, precautions do apply. Overdosage: If you think you have taken too much of this medicine contact a poison control center or  emergency room at once. NOTE: This medicine is only for you. Do not share this medicine with others. What if I miss a dose? If you miss a dose, take it as soon as you can. If it is almost time for your next dose, take only that dose. Do not take double or extra doses. What may interact with this medicine? Do not take this medicine with any of the following medications: -epoetin alfa This list may not describe all possible interactions. Give your health care provider a list of all the medicines, herbs, non-prescription drugs, or dietary supplements you use. Also tell them if you smoke, drink alcohol, or use illegal drugs. Some items may interact with your medicine. What should I watch for while using this medicine? Visit your prescriber or health care professional for regular checks on your progress and for the needed blood tests and blood pressure measurements. It is especially important for the doctor to make sure your hemoglobin level is in the desired range, to limit the risk of potential side effects and to give you the best benefit. Keep all appointments for any recommended tests. Check your blood pressure as directed. Ask your doctor what your blood pressure should be and when you should contact him or her. As your body makes more red blood cells, you may need to take iron, folic acid, or vitamin B supplements. Ask your doctor or health care provider which products are right for you. If you have kidney disease continue dietary restrictions, even though this medication can make you feel better. Talk with your doctor or health   care professional about the foods you eat and the vitamins that you take. What side effects may I notice from receiving this medicine? Side effects that you should report to your doctor or health care professional as soon as possible: -allergic reactions like skin rash, itching or hives, swelling of the face, lips, or tongue -breathing problems -changes in vision -chest  pain -confusion, trouble speaking or understanding -feeling faint or lightheaded, falls -high blood pressure -muscle aches or pains -pain, swelling, warmth in the leg -rapid weight gain -severe headaches -sudden numbness or weakness of the face, arm or leg -trouble walking, dizziness, loss of balance or coordination -seizures (convulsions) -swelling of the ankles, feet, hands -unusually weak or tired Side effects that usually do not require medical attention (report to your doctor or health care professional if they continue or are bothersome): -diarrhea -fever, chills (flu-like symptoms) -headaches -nausea, vomiting -redness, stinging, or swelling at site where injected This list may not describe all possible side effects. Call your doctor for medical advice about side effects. You may report side effects to FDA at 1-800-FDA-1088. Where should I keep my medicine? Keep out of the reach of children. Store in a refrigerator between 2 and 8 degrees C (36 and 46 degrees F). Do not freeze. Do not shake. Throw away any unused portion if using a single-dose vial. Throw away any unused medicine after the expiration date. NOTE: This sheet is a summary. It may not cover all possible information. If you have questions about this medicine, talk to your doctor, pharmacist, or health care provider.  2015, Elsevier/Gold Standard. (2008-01-16 10:23:57)  

## 2014-02-04 DIAGNOSIS — M19042 Primary osteoarthritis, left hand: Secondary | ICD-10-CM | POA: Diagnosis not present

## 2014-02-04 DIAGNOSIS — M65832 Other synovitis and tenosynovitis, left forearm: Secondary | ICD-10-CM | POA: Diagnosis not present

## 2014-02-12 DIAGNOSIS — Z8546 Personal history of malignant neoplasm of prostate: Secondary | ICD-10-CM | POA: Diagnosis not present

## 2014-02-12 DIAGNOSIS — N3941 Urge incontinence: Secondary | ICD-10-CM | POA: Diagnosis not present

## 2014-02-12 DIAGNOSIS — N3281 Overactive bladder: Secondary | ICD-10-CM | POA: Diagnosis not present

## 2014-02-18 DIAGNOSIS — R109 Unspecified abdominal pain: Secondary | ICD-10-CM | POA: Diagnosis not present

## 2014-02-18 DIAGNOSIS — I480 Paroxysmal atrial fibrillation: Secondary | ICD-10-CM | POA: Diagnosis not present

## 2014-02-18 DIAGNOSIS — I129 Hypertensive chronic kidney disease with stage 1 through stage 4 chronic kidney disease, or unspecified chronic kidney disease: Secondary | ICD-10-CM | POA: Diagnosis not present

## 2014-02-18 DIAGNOSIS — R42 Dizziness and giddiness: Secondary | ICD-10-CM | POA: Diagnosis not present

## 2014-02-18 DIAGNOSIS — Z136 Encounter for screening for cardiovascular disorders: Secondary | ICD-10-CM | POA: Diagnosis not present

## 2014-02-18 DIAGNOSIS — N183 Chronic kidney disease, stage 3 (moderate): Secondary | ICD-10-CM | POA: Diagnosis not present

## 2014-02-18 DIAGNOSIS — Z79899 Other long term (current) drug therapy: Secondary | ICD-10-CM | POA: Diagnosis not present

## 2014-02-21 DIAGNOSIS — Z79891 Long term (current) use of opiate analgesic: Secondary | ICD-10-CM | POA: Diagnosis not present

## 2014-02-21 DIAGNOSIS — G603 Idiopathic progressive neuropathy: Secondary | ICD-10-CM | POA: Diagnosis not present

## 2014-02-21 DIAGNOSIS — G894 Chronic pain syndrome: Secondary | ICD-10-CM | POA: Diagnosis not present

## 2014-02-21 DIAGNOSIS — M5417 Radiculopathy, lumbosacral region: Secondary | ICD-10-CM | POA: Diagnosis not present

## 2014-02-21 DIAGNOSIS — M4726 Other spondylosis with radiculopathy, lumbar region: Secondary | ICD-10-CM | POA: Diagnosis not present

## 2014-02-21 DIAGNOSIS — M47816 Spondylosis without myelopathy or radiculopathy, lumbar region: Secondary | ICD-10-CM | POA: Diagnosis not present

## 2014-02-28 DIAGNOSIS — H6123 Impacted cerumen, bilateral: Secondary | ICD-10-CM | POA: Diagnosis not present

## 2014-02-28 DIAGNOSIS — J310 Chronic rhinitis: Secondary | ICD-10-CM | POA: Diagnosis not present

## 2014-03-01 ENCOUNTER — Other Ambulatory Visit (HOSPITAL_COMMUNITY): Payer: Self-pay | Admitting: Anesthesiology

## 2014-03-01 ENCOUNTER — Ambulatory Visit (HOSPITAL_COMMUNITY)
Admission: RE | Admit: 2014-03-01 | Discharge: 2014-03-01 | Disposition: A | Discharge status: Home / Self Care | Payer: Medicare Other | Source: Ambulatory Visit | Attending: Anesthesiology | Admitting: Anesthesiology

## 2014-03-01 DIAGNOSIS — M5136 Other intervertebral disc degeneration, lumbar region: Secondary | ICD-10-CM | POA: Diagnosis not present

## 2014-03-01 DIAGNOSIS — M419 Scoliosis, unspecified: Secondary | ICD-10-CM | POA: Insufficient documentation

## 2014-03-01 DIAGNOSIS — M545 Low back pain: Secondary | ICD-10-CM

## 2014-03-01 DIAGNOSIS — Z79891 Long term (current) use of opiate analgesic: Secondary | ICD-10-CM | POA: Diagnosis not present

## 2014-03-01 DIAGNOSIS — G894 Chronic pain syndrome: Secondary | ICD-10-CM | POA: Diagnosis not present

## 2014-03-01 DIAGNOSIS — M47816 Spondylosis without myelopathy or radiculopathy, lumbar region: Secondary | ICD-10-CM | POA: Diagnosis not present

## 2014-03-01 DIAGNOSIS — G603 Idiopathic progressive neuropathy: Secondary | ICD-10-CM | POA: Diagnosis not present

## 2014-03-04 DIAGNOSIS — M19042 Primary osteoarthritis, left hand: Secondary | ICD-10-CM | POA: Diagnosis not present

## 2014-03-11 DIAGNOSIS — M7501 Adhesive capsulitis of right shoulder: Secondary | ICD-10-CM | POA: Diagnosis not present

## 2014-03-11 DIAGNOSIS — M25511 Pain in right shoulder: Secondary | ICD-10-CM | POA: Diagnosis not present

## 2014-03-11 DIAGNOSIS — S41102D Unspecified open wound of left upper arm, subsequent encounter: Secondary | ICD-10-CM | POA: Diagnosis not present

## 2014-03-14 ENCOUNTER — Telehealth: Payer: Self-pay | Admitting: Oncology

## 2014-03-14 NOTE — Telephone Encounter (Signed)
Pt called to r/s and confirmed labs/ov/inj due to other apts, there was no MD available and ML had nothing until 02/11, scheduled with ML but MD will be site schedule is full.... KJ

## 2014-03-18 DIAGNOSIS — G603 Idiopathic progressive neuropathy: Secondary | ICD-10-CM | POA: Diagnosis not present

## 2014-03-18 DIAGNOSIS — Z79891 Long term (current) use of opiate analgesic: Secondary | ICD-10-CM | POA: Diagnosis not present

## 2014-03-18 DIAGNOSIS — M47816 Spondylosis without myelopathy or radiculopathy, lumbar region: Secondary | ICD-10-CM | POA: Diagnosis not present

## 2014-03-18 DIAGNOSIS — G894 Chronic pain syndrome: Secondary | ICD-10-CM | POA: Diagnosis not present

## 2014-03-19 ENCOUNTER — Ambulatory Visit: Payer: Medicare Other

## 2014-03-19 ENCOUNTER — Other Ambulatory Visit: Payer: Self-pay | Admitting: Anesthesiology

## 2014-03-19 ENCOUNTER — Other Ambulatory Visit: Payer: Medicare Other

## 2014-03-19 ENCOUNTER — Ambulatory Visit: Payer: Medicare Other | Admitting: Oncology

## 2014-03-19 DIAGNOSIS — M545 Low back pain: Secondary | ICD-10-CM

## 2014-03-20 ENCOUNTER — Other Ambulatory Visit: Payer: Self-pay | Admitting: Anesthesiology

## 2014-03-20 DIAGNOSIS — M545 Low back pain: Secondary | ICD-10-CM

## 2014-03-28 ENCOUNTER — Other Ambulatory Visit (HOSPITAL_BASED_OUTPATIENT_CLINIC_OR_DEPARTMENT_OTHER): Payer: Medicare Other

## 2014-03-28 ENCOUNTER — Telehealth: Payer: Self-pay | Admitting: Oncology

## 2014-03-28 ENCOUNTER — Ambulatory Visit (HOSPITAL_BASED_OUTPATIENT_CLINIC_OR_DEPARTMENT_OTHER): Payer: Medicare Other | Admitting: Physician Assistant

## 2014-03-28 ENCOUNTER — Encounter: Payer: Self-pay | Admitting: Physician Assistant

## 2014-03-28 ENCOUNTER — Ambulatory Visit (HOSPITAL_BASED_OUTPATIENT_CLINIC_OR_DEPARTMENT_OTHER): Payer: Medicare Other

## 2014-03-28 VITALS — BP 167/77 | HR 87 | Temp 97.9°F | Resp 18 | Ht 70.0 in | Wt 186.1 lb

## 2014-03-28 DIAGNOSIS — D638 Anemia in other chronic diseases classified elsewhere: Secondary | ICD-10-CM

## 2014-03-28 DIAGNOSIS — N189 Chronic kidney disease, unspecified: Secondary | ICD-10-CM | POA: Diagnosis not present

## 2014-03-28 DIAGNOSIS — D631 Anemia in chronic kidney disease: Secondary | ICD-10-CM

## 2014-03-28 DIAGNOSIS — D508 Other iron deficiency anemias: Secondary | ICD-10-CM

## 2014-03-28 LAB — CBC WITH DIFFERENTIAL/PLATELET
BASO%: 0.5 % (ref 0.0–2.0)
BASOS ABS: 0 10*3/uL (ref 0.0–0.1)
EOS%: 3.9 % (ref 0.0–7.0)
Eosinophils Absolute: 0.2 10*3/uL (ref 0.0–0.5)
HCT: 33.8 % — ABNORMAL LOW (ref 38.4–49.9)
HEMOGLOBIN: 10.5 g/dL — AB (ref 13.0–17.1)
LYMPH%: 20.6 % (ref 14.0–49.0)
MCH: 29.2 pg (ref 27.2–33.4)
MCHC: 31.2 g/dL — ABNORMAL LOW (ref 32.0–36.0)
MCV: 93.6 fL (ref 79.3–98.0)
MONO#: 0.3 10*3/uL (ref 0.1–0.9)
MONO%: 6.1 % (ref 0.0–14.0)
NEUT%: 68.9 % (ref 39.0–75.0)
NEUTROS ABS: 3.7 10*3/uL (ref 1.5–6.5)
PLATELETS: 134 10*3/uL — AB (ref 140–400)
RBC: 3.61 10*6/uL — AB (ref 4.20–5.82)
RDW: 15.2 % — ABNORMAL HIGH (ref 11.0–14.6)
WBC: 5.4 10*3/uL (ref 4.0–10.3)
lymph#: 1.1 10*3/uL (ref 0.9–3.3)

## 2014-03-28 MED ORDER — DARBEPOETIN ALFA 300 MCG/0.6ML IJ SOSY
300.0000 ug | PREFILLED_SYRINGE | Freq: Once | INTRAMUSCULAR | Status: AC
  Administered 2014-03-28: 300 ug via SUBCUTANEOUS
  Filled 2014-03-28: qty 0.6

## 2014-03-28 NOTE — Progress Notes (Signed)
Hematology and Oncology Follow Up Visit  [name] [name] [id number] 03-Oct-1924 81 y.o. 03/28/2014 4:59 PM  CC: Hal T. Felipa Eth, M.D.  Thana Farr. Karsten Ro, M.D.    Principle Diagnosis: 81 year old gentleman with the following diagnoses. 1. Multifactorial anemia.  He has anemia of chronic disease.  It may be anemia of renal insufficiency. 2. History of prostate cancer status post radiation therapy completed in 2008.  Current therapy: He is on Aranesp 300 mcg subcutaneously every 8 weeks to keep his hemoglobin above 11.  Interim History: [name] presents today for a followup visit.  Since his last visit, he reports no complications. He has been receiving Aranesp every 2 months and had been doing relatively well with it.  He had not had any complications associated with it.  His performance status is stable and still able to work part time. He continues to be relatively active without any decline. He has not reported any headaches or blurry vision or syncope. He reports no chest pain or palpitation. He has not reported any wheezing or shortness of breath. Does have coronary nausea or vomiting or abdominal pain. Remainder of his review of systems unremarkable.   Medications: I have reviewed the patient's current medications. Current Outpatient Prescriptions  Medication Sig Dispense Refill  . ALPHA-LIPOIC ACID PO Take 400 mg by mouth daily.     Marland Kitchen aspirin EC 81 MG tablet Take 81 mg by mouth daily.    . calcium-vitamin D (OSCAL WITH D) 500-200 MG-UNIT per tablet Take 1 tablet by mouth daily with breakfast.     . darbepoetin alfa-polysorbate (ARANESP, ALB FREE, SURECLICK) 409 MCG/ML injection Inject 500 mcg into the skin as needed. Every 2 months    . folic acid (FOLVITE) 1 MG tablet Take 1 mg by mouth daily.    . furosemide (LASIX) 40 MG tablet Take 40 mg by mouth daily.     Marland Kitchen lisinopril (PRINIVIL,ZESTRIL) 20 MG tablet Take 10 mg by mouth daily.    . Multiple Vitamins-Minerals (MULTIVITAMIN  WITH MINERALS) tablet Take 1 tablet by mouth daily.      . Multiple Vitamins-Minerals (PRESERVISION AREDS 2 PO) Take 2 capsules by mouth daily.    . Omega-3 Fatty Acids (FISH OIL) 1200 MG CAPS Take 1 capsule by mouth daily.      Marland Kitchen omeprazole (PRILOSEC) 20 MG capsule Take 20 mg by mouth daily.     . traMADol (ULTRAM) 50 MG tablet Take 50 mg by mouth every 6 (six) hours as needed.    Marland Kitchen Umeclidinium-Vilanterol (ANORO ELLIPTA) 62.5-25 MCG/INH AEPB Inhale 2 puffs into the lungs daily.    . vitamin B-12 (CYANOCOBALAMIN) 500 MCG tablet Take 500 mcg by mouth daily.      No current facility-administered medications for this visit.    Allergies: No Known Allergies  Past Medical History, Surgical history, Social history, and Family History were reviewed and updated.    Physical Exam: Blood pressure 167/77, pulse 87, temperature 97.9 F (36.6 C), temperature source Oral, resp. rate 18, height 5\' 10"  (1.778 m), weight 186 lb 1.6 oz (84.414 kg). ECOG: 1 General appearance: alert Head: Normocephalic, without obvious abnormality, atraumatic Neck: no adenopathy Lymph nodes: Cervical, supraclavicular, and axillary nodes normal. Heart:regular rate and rhythm, S1, S2 normal, no murmur, click, rub or gallop Lung:chest clear, no wheezing, rales, normal symmetric air entry Abdomen: soft, non-tender, without masses or organomegaly EXT:no erythema, induration, or nodules   Lab Results: Lab Results  Component Value Date   WBC 5.4 03/28/2014  HGB 10.5* 03/28/2014   HCT 33.8* 03/28/2014   MCV 93.6 03/28/2014   PLT 134* 03/28/2014     Chemistry      Component Value Date/Time   NA 134* 10/23/2012 0419   K 4.1 10/23/2012 0419   CL 103 10/23/2012 0419   CO2 27 10/23/2012 0419   BUN 15 10/23/2012 0419   CREATININE 0.81 10/23/2012 0419      Component Value Date/Time   CALCIUM 8.0* 10/23/2012 0419   ALKPHOS 83 10/17/2012 1555   AST 21 10/17/2012 1555   ALT 11 10/17/2012 1555   BILITOT 0.4  10/17/2012 1555       Impression and Plan:  81 year old gentleman with the following issues. 1. Multifactorial anemia.  There is an element of anemia of renal insufficiency.  Currently on Aranesp 300 mcg to keep his hemoglobin above 11. Hemoglobin is 10.4 and he will receive Aranesp today. We will continue.  to check him every 2 months for a possible injection. 2. Prostate cancer.  No relapse at this point. He saw Dr Karsten Ro in October  for routine follow-up. last PSA continues to be close to 0.  3. Follow-up. Aranesp injection every 8 weeks. Visit in 6 months.  Carlton Adam, PA-C 2/11/20164:59 PM

## 2014-03-28 NOTE — Telephone Encounter (Signed)
gv and printed appt sched adn avs for pt for April

## 2014-03-28 NOTE — Patient Instructions (Signed)
Continue labs and Aranesp injections every 8 weeks Follow-up in 6 months

## 2014-03-29 ENCOUNTER — Ambulatory Visit
Admission: RE | Admit: 2014-03-29 | Discharge: 2014-03-29 | Disposition: A | Discharge status: Home / Self Care | Payer: Medicare Other | Source: Ambulatory Visit | Attending: Anesthesiology | Admitting: Anesthesiology

## 2014-03-29 ENCOUNTER — Telehealth: Payer: Self-pay | Admitting: Oncology

## 2014-03-29 DIAGNOSIS — M4806 Spinal stenosis, lumbar region: Secondary | ICD-10-CM | POA: Diagnosis not present

## 2014-03-29 DIAGNOSIS — M545 Low back pain: Secondary | ICD-10-CM

## 2014-03-29 DIAGNOSIS — M47816 Spondylosis without myelopathy or radiculopathy, lumbar region: Secondary | ICD-10-CM | POA: Diagnosis not present

## 2014-03-29 NOTE — Telephone Encounter (Signed)
per pof to sch pt appt-sch & cld & spoke to [name](wife)gave pt cppt times & dates-mailed copy of sch

## 2014-04-08 DIAGNOSIS — M47816 Spondylosis without myelopathy or radiculopathy, lumbar region: Secondary | ICD-10-CM | POA: Diagnosis not present

## 2014-04-08 DIAGNOSIS — G894 Chronic pain syndrome: Secondary | ICD-10-CM | POA: Diagnosis not present

## 2014-04-08 DIAGNOSIS — Z79891 Long term (current) use of opiate analgesic: Secondary | ICD-10-CM | POA: Diagnosis not present

## 2014-04-08 DIAGNOSIS — G603 Idiopathic progressive neuropathy: Secondary | ICD-10-CM | POA: Diagnosis not present

## 2014-04-11 DIAGNOSIS — L821 Other seborrheic keratosis: Secondary | ICD-10-CM | POA: Diagnosis not present

## 2014-04-11 DIAGNOSIS — D1801 Hemangioma of skin and subcutaneous tissue: Secondary | ICD-10-CM | POA: Diagnosis not present

## 2014-04-11 DIAGNOSIS — D485 Neoplasm of uncertain behavior of skin: Secondary | ICD-10-CM | POA: Diagnosis not present

## 2014-04-11 DIAGNOSIS — D225 Melanocytic nevi of trunk: Secondary | ICD-10-CM | POA: Diagnosis not present

## 2014-04-11 DIAGNOSIS — L570 Actinic keratosis: Secondary | ICD-10-CM | POA: Diagnosis not present

## 2014-05-06 DIAGNOSIS — G894 Chronic pain syndrome: Secondary | ICD-10-CM | POA: Diagnosis not present

## 2014-05-06 DIAGNOSIS — Z79891 Long term (current) use of opiate analgesic: Secondary | ICD-10-CM | POA: Diagnosis not present

## 2014-05-06 DIAGNOSIS — G603 Idiopathic progressive neuropathy: Secondary | ICD-10-CM | POA: Diagnosis not present

## 2014-05-06 DIAGNOSIS — M47816 Spondylosis without myelopathy or radiculopathy, lumbar region: Secondary | ICD-10-CM | POA: Diagnosis not present

## 2014-05-23 ENCOUNTER — Other Ambulatory Visit (HOSPITAL_BASED_OUTPATIENT_CLINIC_OR_DEPARTMENT_OTHER): Payer: Medicare Other

## 2014-05-23 ENCOUNTER — Ambulatory Visit: Payer: Medicare Other | Admitting: Oncology

## 2014-05-23 ENCOUNTER — Ambulatory Visit (HOSPITAL_BASED_OUTPATIENT_CLINIC_OR_DEPARTMENT_OTHER): Payer: Medicare Other

## 2014-05-23 DIAGNOSIS — J449 Chronic obstructive pulmonary disease, unspecified: Secondary | ICD-10-CM

## 2014-05-23 DIAGNOSIS — N289 Disorder of kidney and ureter, unspecified: Secondary | ICD-10-CM

## 2014-05-23 DIAGNOSIS — K432 Incisional hernia without obstruction or gangrene: Secondary | ICD-10-CM

## 2014-05-23 DIAGNOSIS — D631 Anemia in chronic kidney disease: Secondary | ICD-10-CM

## 2014-05-23 DIAGNOSIS — D638 Anemia in other chronic diseases classified elsewhere: Secondary | ICD-10-CM

## 2014-05-23 DIAGNOSIS — D649 Anemia, unspecified: Secondary | ICD-10-CM

## 2014-05-23 DIAGNOSIS — N189 Chronic kidney disease, unspecified: Principal | ICD-10-CM

## 2014-05-23 LAB — CBC WITH DIFFERENTIAL/PLATELET
BASO%: 0.2 % (ref 0.0–2.0)
BASOS ABS: 0 10*3/uL (ref 0.0–0.1)
EOS ABS: 0.2 10*3/uL (ref 0.0–0.5)
EOS%: 3.5 % (ref 0.0–7.0)
HCT: 33.5 % — ABNORMAL LOW (ref 38.4–49.9)
HEMOGLOBIN: 10.8 g/dL — AB (ref 13.0–17.1)
LYMPH%: 21.7 % (ref 14.0–49.0)
MCH: 30.6 pg (ref 27.2–33.4)
MCHC: 32.2 g/dL (ref 32.0–36.0)
MCV: 94.9 fL (ref 79.3–98.0)
MONO#: 0.3 10*3/uL (ref 0.1–0.9)
MONO%: 5.9 % (ref 0.0–14.0)
NEUT%: 68.7 % (ref 39.0–75.0)
NEUTROS ABS: 3.4 10*3/uL (ref 1.5–6.5)
Platelets: 113 10*3/uL — ABNORMAL LOW (ref 140–400)
RBC: 3.53 10*6/uL — AB (ref 4.20–5.82)
RDW: 14.6 % (ref 11.0–14.6)
WBC: 4.9 10*3/uL (ref 4.0–10.3)
lymph#: 1.1 10*3/uL (ref 0.9–3.3)

## 2014-05-23 MED ORDER — DARBEPOETIN ALFA 300 MCG/0.6ML IJ SOSY
300.0000 ug | PREFILLED_SYRINGE | Freq: Once | INTRAMUSCULAR | Status: AC
  Administered 2014-05-23: 300 ug via SUBCUTANEOUS
  Filled 2014-05-23: qty 0.6

## 2014-06-03 ENCOUNTER — Ambulatory Visit (INDEPENDENT_AMBULATORY_CARE_PROVIDER_SITE_OTHER): Payer: Medicare Other | Admitting: Pulmonary Disease

## 2014-06-03 ENCOUNTER — Encounter: Payer: Self-pay | Admitting: Pulmonary Disease

## 2014-06-03 VITALS — BP 132/74 | HR 73 | Temp 97.8°F | Ht 69.0 in | Wt 183.8 lb

## 2014-06-03 DIAGNOSIS — J438 Other emphysema: Secondary | ICD-10-CM | POA: Diagnosis not present

## 2014-06-03 DIAGNOSIS — Z23 Encounter for immunization: Secondary | ICD-10-CM

## 2014-06-03 NOTE — Progress Notes (Signed)
   Subjective:    Patient ID: [name] [name], male    DOB: Dec 27, 1924, 81 y.o.   MRN: [id number]  HPI The patient comes in today for follow-up of his known COPD. He has done well on anoro, and thinks it is helped his breathing. He has had no recent acute exacerbation, and is trying to stay as active as possible. He has not had to overuse his rescue inhaler.   Review of Systems  Constitutional: Negative for fever and unexpected weight change.  HENT: Negative for congestion, dental problem, ear pain, nosebleeds, postnasal drip, rhinorrhea, sinus pressure, sneezing, sore throat and trouble swallowing.   Eyes: Negative for redness and itching.  Respiratory: Positive for shortness of breath. Negative for cough, chest tightness and wheezing.   Cardiovascular: Negative for palpitations and leg swelling.  Gastrointestinal: Negative for nausea and vomiting.  Genitourinary: Negative for dysuria.  Musculoskeletal: Negative for joint swelling.  Skin: Negative for rash.  Neurological: Negative for headaches.  Hematological: Does not bruise/bleed easily.  Psychiatric/Behavioral: Negative for dysphoric mood. The patient is not nervous/anxious.        Objective:   Physical Exam Well-developed male in no acute distress Nose without purulence or discharge noted Neck without lymphadenopathy or thyromegaly Chest with mildly decreased breath sounds, no wheezes or crackles Heart exam with regular rate and rhythm Lower extremities with no significant edema, no cyanosis Alert and oriented, moves all 4 extremities.       Assessment & Plan:

## 2014-06-03 NOTE — Assessment & Plan Note (Signed)
The patient is doing very well from a COPD standpoint on his current regimen. He is had no recent acute exacerbation, and feels that his exertional tolerance is at baseline. Will continue him on his current bronchodilator regimen, and will also give him Prevnar 13 today. He is to follow-up again in 6 months.

## 2014-06-03 NOTE — Patient Instructions (Signed)
No change in medications. Will give you the prevnar 13 shot today.  followup again in 31mos.

## 2014-06-03 NOTE — Addendum Note (Signed)
Addended by: Oscar La R on: 06/03/2014 11:46 AM   Modules accepted: Orders

## 2014-06-07 DIAGNOSIS — G629 Polyneuropathy, unspecified: Secondary | ICD-10-CM | POA: Diagnosis not present

## 2014-06-07 DIAGNOSIS — D649 Anemia, unspecified: Secondary | ICD-10-CM | POA: Diagnosis not present

## 2014-06-07 DIAGNOSIS — K219 Gastro-esophageal reflux disease without esophagitis: Secondary | ICD-10-CM | POA: Diagnosis not present

## 2014-06-07 DIAGNOSIS — Z1389 Encounter for screening for other disorder: Secondary | ICD-10-CM | POA: Diagnosis not present

## 2014-06-07 DIAGNOSIS — Z79899 Other long term (current) drug therapy: Secondary | ICD-10-CM | POA: Diagnosis not present

## 2014-06-07 DIAGNOSIS — Z0000 Encounter for general adult medical examination without abnormal findings: Secondary | ICD-10-CM | POA: Diagnosis not present

## 2014-06-07 DIAGNOSIS — N183 Chronic kidney disease, stage 3 (moderate): Secondary | ICD-10-CM | POA: Diagnosis not present

## 2014-06-07 DIAGNOSIS — I129 Hypertensive chronic kidney disease with stage 1 through stage 4 chronic kidney disease, or unspecified chronic kidney disease: Secondary | ICD-10-CM | POA: Diagnosis not present

## 2014-06-07 DIAGNOSIS — G4733 Obstructive sleep apnea (adult) (pediatric): Secondary | ICD-10-CM | POA: Diagnosis not present

## 2014-06-24 ENCOUNTER — Encounter: Payer: Self-pay | Admitting: Internal Medicine

## 2014-06-24 ENCOUNTER — Ambulatory Visit (INDEPENDENT_AMBULATORY_CARE_PROVIDER_SITE_OTHER): Payer: Medicare Other | Admitting: Internal Medicine

## 2014-06-24 VITALS — BP 144/72 | HR 74 | Ht 69.0 in | Wt 183.6 lb

## 2014-06-24 DIAGNOSIS — I480 Paroxysmal atrial fibrillation: Secondary | ICD-10-CM

## 2014-06-24 DIAGNOSIS — Z950 Presence of cardiac pacemaker: Secondary | ICD-10-CM

## 2014-06-24 DIAGNOSIS — I495 Sick sinus syndrome: Secondary | ICD-10-CM

## 2014-06-24 DIAGNOSIS — Z45018 Encounter for adjustment and management of other part of cardiac pacemaker: Secondary | ICD-10-CM

## 2014-06-24 NOTE — Patient Instructions (Signed)
Medication Instructions:  Your physician recommends that you continue on your current medications as directed. Please refer to the Current Medication list given to you today.  Labwork: None ordered  Testing/Procedures: None ordered  Follow-Up: Remote monitoring is used to monitor your Pacemaker of ICD from home. This monitoring reduces the number of office visits required to check your device to one time per year. It allows [place] to keep an eye on the functioning of your device to ensure it is working properly. You are scheduled for a device check from home on 09/23/14. You may send your transmission at any time that day. If you have a wireless device, the transmission will be sent automatically. After your physician reviews your transmission, you will receive a postcard with your next transmission date.  Your physician wants you to follow-up in: 1 year with Dr. Caryl Comes.  You will receive a reminder letter in the mail two months in advance. If you don't receive a letter, please call our office to schedule the follow-up appointment.  Thank you for choosing [facility]!!    Any Other Special Instructions Will Be Listed Below (If Applicable).

## 2014-06-24 NOTE — Progress Notes (Signed)
Patient Care Team: Lajean Manes, MD as PCP - General (Internal Medicine) Wyatt Portela, MD (Hematology and Oncology) Kathie Rhodes, MD as Attending Physician (Urology)   HPI  [name] [name] is a 81 y.o. male Seen to establish pacemaker followup. He underwent device implantation for sinus node dysfunction in the setting of chronic dizziness.    It also has a history of atrial fibrillation and hypertension. He is currently not on anticoagulation per Dr. Saundra Shelling.  He struggles with orthostatic lightheadedness but this may actually be an issue of balance. He has been diagnosed with peripheral neuropathy. He is undergoing physical therapy for rate balance without improvement.  Marland Kitchen He also significant fatigue and dyspnea with modest exertion. He has chronic edema. He denies chest pain. There've been no palpitations.  Echocardiogram 4/14 demonstrated normal LV function left atrial enlargement Myoview scan 10/13 demonstrated an EF of 48-56% without evidence of reversible ischemia.   Past Medical History  Diagnosis Date  . Hypertension   . Hearing loss   . Pacemaker   . Leg swelling 10/19/2011    LLE  . COPD (chronic obstructive pulmonary disease) 10/19/2011    "touch"  . Exertional dyspnea 10/19/2011  . OSA on CPAP 10/19/2011    "sometimes wear CPAP"  . Prostate cancer     S/P radiation tx ~ 2008  . History of blood transfusion     "think it was related to pacemaker placement"  . GERD (gastroesophageal reflux disease)   . Arthritis     "back"  . Chronic lower back pain       Current Outpatient Prescriptions  Medication Sig Dispense Refill  . ALPHA-LIPOIC ACID PO Take 400 mg by mouth daily.     Marland Kitchen aspirin EC 81 MG tablet Take 81 mg by mouth daily.    . calcium-vitamin D (OSCAL WITH D) 500-200 MG-UNIT per tablet Take 1 tablet by mouth daily with breakfast.     . darbepoetin alfa-polysorbate (ARANESP, ALB FREE, SURECLICK) 734 MCG/ML injection Inject 500 mcg into the skin as needed.  Every 2 months    . folic acid (FOLVITE) 1 MG tablet Take 1 mg by mouth daily.    . furosemide (LASIX) 40 MG tablet Take 40 mg by mouth daily.     Marland Kitchen lisinopril (PRINIVIL,ZESTRIL) 20 MG tablet Take 10 mg by mouth daily.    . Multiple Vitamins-Minerals (PRESERVISION AREDS 2 PO) Take 2 capsules by mouth daily.    . Omega-3 Fatty Acids (FISH OIL) 1200 MG CAPS Take 1 capsule by mouth daily.      Marland Kitchen omeprazole (PRILOSEC) 20 MG capsule Take 20 mg by mouth daily.     Marland Kitchen oxybutynin (DITROPAN XL) 15 MG 24 hr tablet Take 15 mg by mouth daily.    . traMADol (ULTRAM) 50 MG tablet Take by mouth every 6 (six) hours as needed for moderate pain.    Marland Kitchen Umeclidinium-Vilanterol (ANORO ELLIPTA) 62.5-25 MCG/INH AEPB Inhale 1 puff into the lungs 2 (two) times daily.     . vitamin B-12 (CYANOCOBALAMIN) 500 MCG tablet Take 500 mcg by mouth daily.      No current facility-administered medications for this visit.    No Known Allergies  Review of Systems negative except from HPI and PMH  Physical Exam BP 144/72 mmHg  Pulse 74  Ht 5\' 9"  (1.753 m)  Wt 183 lb 9.6 oz (83.28 kg)  BMI 27.10 kg/m2  SpO2 98% Well developed and nourished in no acute distress  HENT normal Neck supple with JVP-flat Clear Regular rate and rhythm, no murmurs or gallops Abd-soft with active BS No Clubbing cyanosis edema Skin-warm and dry A & Oriented  Grossly normal sensory and motor function gait is wide-based     Assessment and  Plan  Sinus node dysfunction  Pacemaker  [employer]  The patient's device was interrogated.  The information was reviewed. Changes were made as outlined below  Hypertension -Orthostatic  Dyspnea on exertion      sounds it sounds like the issues related to changes in position may be more neurological. There is some orthostatic hypertension which may be autonomic dysregulation. His blood pressures are elevated today. I've asked that he follow-up with these with Dr. Felipa Eth.   He is device dependent  in his atrium.  His heart rate excursion was rather extreme with 10-15% of his beats in each of this to higher rate bins; I reprogrammed his device to decrease aVL and exercise rate response.

## 2014-06-26 LAB — CUP PACEART INCLINIC DEVICE CHECK
Battery Impedance: 707 Ohm
Brady Statistic AP VP Percent: 2 %
Brady Statistic AP VS Percent: 67 %
Brady Statistic AS VS Percent: 31 %
Lead Channel Impedance Value: 465 Ohm
Lead Channel Pacing Threshold Amplitude: 0.75 V
Lead Channel Pacing Threshold Amplitude: 1 V
Lead Channel Pacing Threshold Pulse Width: 0.4 ms
Lead Channel Pacing Threshold Pulse Width: 0.4 ms
Lead Channel Sensing Intrinsic Amplitude: 2 mV
Lead Channel Sensing Intrinsic Amplitude: 5.6 mV
Lead Channel Setting Pacing Amplitude: 2.5 V
Lead Channel Setting Pacing Pulse Width: 0.4 ms
Lead Channel Setting Sensing Sensitivity: 2 mV
MDC IDC MSMT BATTERY REMAINING LONGEVITY: 56 mo
MDC IDC MSMT BATTERY VOLTAGE: 2.77 V
MDC IDC MSMT LEADCHNL RV IMPEDANCE VALUE: 576 Ohm
MDC IDC SESS DTM: 20160509165716
MDC IDC SET LEADCHNL RA PACING AMPLITUDE: 1.75 V
MDC IDC STAT BRADY AS VP PERCENT: 0 %

## 2014-07-01 DIAGNOSIS — M25562 Pain in left knee: Secondary | ICD-10-CM | POA: Diagnosis not present

## 2014-07-01 DIAGNOSIS — S93402A Sprain of unspecified ligament of left ankle, initial encounter: Secondary | ICD-10-CM | POA: Diagnosis not present

## 2014-07-09 DIAGNOSIS — M25572 Pain in left ankle and joints of left foot: Secondary | ICD-10-CM | POA: Diagnosis not present

## 2014-07-09 DIAGNOSIS — M25562 Pain in left knee: Secondary | ICD-10-CM | POA: Diagnosis not present

## 2014-07-17 ENCOUNTER — Other Ambulatory Visit: Payer: Self-pay | Admitting: *Deleted

## 2014-07-17 DIAGNOSIS — D649 Anemia, unspecified: Secondary | ICD-10-CM

## 2014-07-18 ENCOUNTER — Ambulatory Visit (HOSPITAL_BASED_OUTPATIENT_CLINIC_OR_DEPARTMENT_OTHER): Payer: Medicare Other

## 2014-07-18 ENCOUNTER — Other Ambulatory Visit (HOSPITAL_BASED_OUTPATIENT_CLINIC_OR_DEPARTMENT_OTHER): Payer: Medicare Other

## 2014-07-18 VITALS — BP 137/65 | HR 80 | Temp 98.0°F

## 2014-07-18 DIAGNOSIS — D638 Anemia in other chronic diseases classified elsewhere: Secondary | ICD-10-CM

## 2014-07-18 DIAGNOSIS — D649 Anemia, unspecified: Secondary | ICD-10-CM | POA: Diagnosis present

## 2014-07-18 DIAGNOSIS — J449 Chronic obstructive pulmonary disease, unspecified: Secondary | ICD-10-CM

## 2014-07-18 DIAGNOSIS — N289 Disorder of kidney and ureter, unspecified: Secondary | ICD-10-CM

## 2014-07-18 DIAGNOSIS — K432 Incisional hernia without obstruction or gangrene: Secondary | ICD-10-CM

## 2014-07-18 LAB — CBC WITH DIFFERENTIAL/PLATELET
BASO%: 0.2 % (ref 0.0–2.0)
BASOS ABS: 0 10*3/uL (ref 0.0–0.1)
EOS%: 3.3 % (ref 0.0–7.0)
Eosinophils Absolute: 0.2 10*3/uL (ref 0.0–0.5)
HCT: 33.3 % — ABNORMAL LOW (ref 38.4–49.9)
HEMOGLOBIN: 10.7 g/dL — AB (ref 13.0–17.1)
LYMPH%: 23.3 % (ref 14.0–49.0)
MCH: 30.1 pg (ref 27.2–33.4)
MCHC: 32.1 g/dL (ref 32.0–36.0)
MCV: 93.5 fL (ref 79.3–98.0)
MONO#: 0.3 10*3/uL (ref 0.1–0.9)
MONO%: 6.1 % (ref 0.0–14.0)
NEUT%: 67.1 % (ref 39.0–75.0)
NEUTROS ABS: 3.7 10*3/uL (ref 1.5–6.5)
PLATELETS: 137 10*3/uL — AB (ref 140–400)
RBC: 3.56 10*6/uL — AB (ref 4.20–5.82)
RDW: 14.9 % — AB (ref 11.0–14.6)
WBC: 5.5 10*3/uL (ref 4.0–10.3)
lymph#: 1.3 10*3/uL (ref 0.9–3.3)

## 2014-07-18 MED ORDER — DARBEPOETIN ALFA 300 MCG/0.6ML IJ SOSY
300.0000 ug | PREFILLED_SYRINGE | Freq: Once | INTRAMUSCULAR | Status: AC
  Administered 2014-07-18: 300 ug via SUBCUTANEOUS
  Filled 2014-07-18: qty 0.6

## 2014-07-26 DIAGNOSIS — M1812 Unilateral primary osteoarthritis of first carpometacarpal joint, left hand: Secondary | ICD-10-CM | POA: Diagnosis not present

## 2014-07-26 DIAGNOSIS — M1811 Unilateral primary osteoarthritis of first carpometacarpal joint, right hand: Secondary | ICD-10-CM | POA: Diagnosis not present

## 2014-07-29 ENCOUNTER — Telehealth: Payer: Self-pay | Admitting: Pulmonary Disease

## 2014-07-29 NOTE — Telephone Encounter (Signed)
Spoke with pt. He thought he was to use Anoro twice a day. Advised him that he was to use it once daily. Nothing further was needed.

## 2014-07-31 DIAGNOSIS — M25572 Pain in left ankle and joints of left foot: Secondary | ICD-10-CM | POA: Diagnosis not present

## 2014-08-05 DIAGNOSIS — Z79891 Long term (current) use of opiate analgesic: Secondary | ICD-10-CM | POA: Diagnosis not present

## 2014-08-05 DIAGNOSIS — G603 Idiopathic progressive neuropathy: Secondary | ICD-10-CM | POA: Diagnosis not present

## 2014-08-05 DIAGNOSIS — M47816 Spondylosis without myelopathy or radiculopathy, lumbar region: Secondary | ICD-10-CM | POA: Diagnosis not present

## 2014-08-05 DIAGNOSIS — G894 Chronic pain syndrome: Secondary | ICD-10-CM | POA: Diagnosis not present

## 2014-08-20 DIAGNOSIS — S61401D Unspecified open wound of right hand, subsequent encounter: Secondary | ICD-10-CM | POA: Diagnosis not present

## 2014-08-20 DIAGNOSIS — M25642 Stiffness of left hand, not elsewhere classified: Secondary | ICD-10-CM | POA: Diagnosis not present

## 2014-08-20 DIAGNOSIS — M79645 Pain in left finger(s): Secondary | ICD-10-CM | POA: Diagnosis not present

## 2014-08-20 DIAGNOSIS — S60471D Other superficial bite of left index finger, subsequent encounter: Secondary | ICD-10-CM | POA: Diagnosis not present

## 2014-08-22 DIAGNOSIS — M25572 Pain in left ankle and joints of left foot: Secondary | ICD-10-CM | POA: Diagnosis not present

## 2014-08-22 DIAGNOSIS — M25511 Pain in right shoulder: Secondary | ICD-10-CM | POA: Diagnosis not present

## 2014-08-23 DIAGNOSIS — M25642 Stiffness of left hand, not elsewhere classified: Secondary | ICD-10-CM | POA: Diagnosis not present

## 2014-08-23 DIAGNOSIS — S61401D Unspecified open wound of right hand, subsequent encounter: Secondary | ICD-10-CM | POA: Diagnosis not present

## 2014-08-23 DIAGNOSIS — M79645 Pain in left finger(s): Secondary | ICD-10-CM | POA: Diagnosis not present

## 2014-08-23 DIAGNOSIS — S60471D Other superficial bite of left index finger, subsequent encounter: Secondary | ICD-10-CM | POA: Diagnosis not present

## 2014-08-27 DIAGNOSIS — M25642 Stiffness of left hand, not elsewhere classified: Secondary | ICD-10-CM | POA: Diagnosis not present

## 2014-08-27 DIAGNOSIS — M79645 Pain in left finger(s): Secondary | ICD-10-CM | POA: Diagnosis not present

## 2014-08-27 DIAGNOSIS — S61401D Unspecified open wound of right hand, subsequent encounter: Secondary | ICD-10-CM | POA: Diagnosis not present

## 2014-08-27 DIAGNOSIS — S60471D Other superficial bite of left index finger, subsequent encounter: Secondary | ICD-10-CM | POA: Diagnosis not present

## 2014-08-28 DIAGNOSIS — D2111 Benign neoplasm of connective and other soft tissue of right upper limb, including shoulder: Secondary | ICD-10-CM | POA: Diagnosis not present

## 2014-08-28 DIAGNOSIS — S60471D Other superficial bite of left index finger, subsequent encounter: Secondary | ICD-10-CM | POA: Diagnosis not present

## 2014-08-29 ENCOUNTER — Ambulatory Visit (INDEPENDENT_AMBULATORY_CARE_PROVIDER_SITE_OTHER): Payer: Medicare Other | Admitting: *Deleted

## 2014-08-29 DIAGNOSIS — I480 Paroxysmal atrial fibrillation: Secondary | ICD-10-CM

## 2014-08-29 DIAGNOSIS — I495 Sick sinus syndrome: Secondary | ICD-10-CM

## 2014-08-29 DIAGNOSIS — S61401D Unspecified open wound of right hand, subsequent encounter: Secondary | ICD-10-CM | POA: Diagnosis not present

## 2014-08-29 DIAGNOSIS — S60471D Other superficial bite of left index finger, subsequent encounter: Secondary | ICD-10-CM | POA: Diagnosis not present

## 2014-08-29 NOTE — Progress Notes (Signed)
Patient seen in device clinic for [employer] upgrade only (N/C). After explaining both of the upgrade options to patient he chose the wirex as the best option for him. A demonstration was performed on how to use the equipment. All of the patient's questions were answered. Patient voiced his understanding of all of the information provided. Patient to follow up as scheduled. [place] ordered in [place].

## 2014-09-06 DIAGNOSIS — G5622 Lesion of ulnar nerve, left upper limb: Secondary | ICD-10-CM | POA: Diagnosis not present

## 2014-09-06 DIAGNOSIS — G5621 Lesion of ulnar nerve, right upper limb: Secondary | ICD-10-CM | POA: Diagnosis not present

## 2014-09-09 ENCOUNTER — Telehealth: Payer: Self-pay | Admitting: Oncology

## 2014-09-09 NOTE — Telephone Encounter (Signed)
Pt called to r/s labs/ov/inj due to guest at home, pt confirmed new D/T .Marland Kitchen... KJ

## 2014-09-12 ENCOUNTER — Other Ambulatory Visit: Payer: Medicare Other

## 2014-09-12 ENCOUNTER — Ambulatory Visit: Payer: Medicare Other

## 2014-09-12 ENCOUNTER — Ambulatory Visit: Payer: Medicare Other | Admitting: Oncology

## 2014-09-23 ENCOUNTER — Telehealth: Payer: Self-pay | Admitting: Cardiology

## 2014-09-23 ENCOUNTER — Encounter: Payer: Self-pay | Admitting: Internal Medicine

## 2014-09-23 ENCOUNTER — Ambulatory Visit (INDEPENDENT_AMBULATORY_CARE_PROVIDER_SITE_OTHER): Payer: Medicare Other | Admitting: *Deleted

## 2014-09-23 DIAGNOSIS — D692 Other nonthrombocytopenic purpura: Secondary | ICD-10-CM | POA: Diagnosis not present

## 2014-09-23 DIAGNOSIS — R682 Dry mouth, unspecified: Secondary | ICD-10-CM | POA: Diagnosis not present

## 2014-09-23 DIAGNOSIS — I495 Sick sinus syndrome: Secondary | ICD-10-CM

## 2014-09-23 DIAGNOSIS — G4700 Insomnia, unspecified: Secondary | ICD-10-CM | POA: Diagnosis not present

## 2014-09-23 DIAGNOSIS — I10 Essential (primary) hypertension: Secondary | ICD-10-CM | POA: Diagnosis not present

## 2014-09-23 DIAGNOSIS — Z7189 Other specified counseling: Secondary | ICD-10-CM | POA: Diagnosis not present

## 2014-09-23 DIAGNOSIS — R1310 Dysphagia, unspecified: Secondary | ICD-10-CM | POA: Diagnosis not present

## 2014-09-23 NOTE — Telephone Encounter (Signed)
Spoke with pt and reminded pt of remote transmission that is due today. Pt verbalized understanding.   

## 2014-09-23 NOTE — Progress Notes (Signed)
Remote pacemaker transmission.   

## 2014-09-24 ENCOUNTER — Other Ambulatory Visit (HOSPITAL_BASED_OUTPATIENT_CLINIC_OR_DEPARTMENT_OTHER): Payer: Medicare Other

## 2014-09-24 ENCOUNTER — Other Ambulatory Visit: Payer: Self-pay | Admitting: *Deleted

## 2014-09-24 ENCOUNTER — Ambulatory Visit (HOSPITAL_BASED_OUTPATIENT_CLINIC_OR_DEPARTMENT_OTHER): Payer: Medicare Other

## 2014-09-24 ENCOUNTER — Telehealth: Payer: Self-pay | Admitting: Oncology

## 2014-09-24 ENCOUNTER — Ambulatory Visit (HOSPITAL_BASED_OUTPATIENT_CLINIC_OR_DEPARTMENT_OTHER): Payer: Medicare Other | Admitting: Oncology

## 2014-09-24 VITALS — BP 136/70 | HR 79 | Temp 98.2°F | Resp 18 | Ht 69.0 in | Wt 178.5 lb

## 2014-09-24 DIAGNOSIS — J449 Chronic obstructive pulmonary disease, unspecified: Secondary | ICD-10-CM

## 2014-09-24 DIAGNOSIS — C61 Malignant neoplasm of prostate: Secondary | ICD-10-CM

## 2014-09-24 DIAGNOSIS — D649 Anemia, unspecified: Secondary | ICD-10-CM

## 2014-09-24 DIAGNOSIS — K432 Incisional hernia without obstruction or gangrene: Secondary | ICD-10-CM

## 2014-09-24 DIAGNOSIS — D631 Anemia in chronic kidney disease: Secondary | ICD-10-CM

## 2014-09-24 DIAGNOSIS — Z8546 Personal history of malignant neoplasm of prostate: Secondary | ICD-10-CM | POA: Diagnosis not present

## 2014-09-24 DIAGNOSIS — N189 Chronic kidney disease, unspecified: Secondary | ICD-10-CM

## 2014-09-24 DIAGNOSIS — N289 Disorder of kidney and ureter, unspecified: Secondary | ICD-10-CM

## 2014-09-24 DIAGNOSIS — D638 Anemia in other chronic diseases classified elsewhere: Secondary | ICD-10-CM

## 2014-09-24 LAB — CBC WITH DIFFERENTIAL/PLATELET
BASO%: 0.8 % (ref 0.0–2.0)
Basophils Absolute: 0 10*3/uL (ref 0.0–0.1)
EOS ABS: 0.2 10*3/uL (ref 0.0–0.5)
EOS%: 3 % (ref 0.0–7.0)
HEMATOCRIT: 33 % — AB (ref 38.4–49.9)
HGB: 10.9 g/dL — ABNORMAL LOW (ref 13.0–17.1)
LYMPH%: 20.2 % (ref 14.0–49.0)
MCH: 30.8 pg (ref 27.2–33.4)
MCHC: 32.9 g/dL (ref 32.0–36.0)
MCV: 93.6 fL (ref 79.3–98.0)
MONO#: 0.3 10*3/uL (ref 0.1–0.9)
MONO%: 5.6 % (ref 0.0–14.0)
NEUT#: 4.2 10*3/uL (ref 1.5–6.5)
NEUT%: 70.4 % (ref 39.0–75.0)
Platelets: 126 10*3/uL — ABNORMAL LOW (ref 140–400)
RBC: 3.53 10*6/uL — ABNORMAL LOW (ref 4.20–5.82)
RDW: 15.5 % — AB (ref 11.0–14.6)
WBC: 6 10*3/uL (ref 4.0–10.3)
lymph#: 1.2 10*3/uL (ref 0.9–3.3)

## 2014-09-24 MED ORDER — DARBEPOETIN ALFA 300 MCG/0.6ML IJ SOSY
300.0000 ug | PREFILLED_SYRINGE | Freq: Once | INTRAMUSCULAR | Status: AC
  Administered 2014-09-24: 300 ug via SUBCUTANEOUS
  Filled 2014-09-24: qty 0.6

## 2014-09-24 NOTE — Telephone Encounter (Signed)
Gave adn printed appt sched and avs for pt for OCT, DEC and Feb

## 2014-09-24 NOTE — Progress Notes (Signed)
Hematology and Oncology Follow Up Visit  [name] [name] [id number] [birth year] 81 y.o. 09/24/2014 1:19 PM  CC: Hal T. Felipa Eth, M.D.  Thana Farr. Karsten Ro, M.D.    Principle Diagnosis: 81 year old gentleman with the following diagnoses. 1. Multifactorial anemia.  He has anemia of chronic disease.  It may be anemia of renal insufficiency. 2. History of prostate cancer status post radiation therapy completed in 2008.  Current therapy: He is on Aranesp 300 mcg subcutaneously every 8 weeks to keep his hemoglobin above 11.  Interim History: [name] [name] presents today for a followup visit. Since his last visit, he continues to do well without any complaints. He has been receiving Aranesp every 2 months and had been doing relatively well with it.  He reports no complications associated with it.  His performance status is stable and still able to work one day a week with his wife. He continues to be relatively active without any decline. He reports that her symptoms improved by Aranesp.  He has not reported any headaches or blurry vision or syncope. He reports no chest pain or palpitation. He has not reported any wheezing or shortness of breath. Does have coronary nausea or vomiting or abdominal pain. Remainder of his review of systems unremarkable.   Medications: I have reviewed the patient's current medications. Current Outpatient Prescriptions  Medication Sig Dispense Refill  . aspirin EC 81 MG tablet Take 81 mg by mouth daily.    . calcium-vitamin D (OSCAL WITH D) 500-200 MG-UNIT per tablet Take 1 tablet by mouth daily with breakfast.     . darbepoetin alfa-polysorbate (ARANESP, ALB FREE, SURECLICK) 630 MCG/ML injection Inject 500 mcg into the skin as needed. Every 2 months    . furosemide (LASIX) 40 MG tablet Take 40 mg by mouth daily.     Marland Kitchen lisinopril (PRINIVIL,ZESTRIL) 20 MG tablet Take 10 mg by mouth daily.    . Omega-3 Fatty Acids (FISH OIL) 1200 MG CAPS Take 1 capsule by mouth daily.       Marland Kitchen omeprazole (PRILOSEC) 20 MG capsule Take 20 mg by mouth daily.     Marland Kitchen oxybutynin (DITROPAN XL) 15 MG 24 hr tablet Take 15 mg by mouth daily.    Marland Kitchen Umeclidinium-Vilanterol (ANORO ELLIPTA) 62.5-25 MCG/INH AEPB Inhale 1 puff into the lungs daily.     . vitamin B-12 (CYANOCOBALAMIN) 500 MCG tablet Take 500 mcg by mouth daily.      No current facility-administered medications for this visit.    Allergies: No Known Allergies  Past Medical History, Surgical history, Social history, and Family History were reviewed and updated.    Physical Exam: Blood pressure 136/70, pulse 79, temperature 98.2 F (36.8 C), temperature source Oral, resp. rate 18, height 5\' 9"  (1.753 m), weight 178 lb 8 oz (80.967 kg), SpO2 100 %. ECOG: 1 General appearance: alert, awake gentleman not in any distress. Head: Normocephalic, without obvious abnormality, atraumatic Neck: no adenopathy Lymph nodes: Cervical, supraclavicular, and axillary nodes normal. Heart:regular rate and rhythm, S1, S2 normal, no murmur, click, rub or gallop Lung:chest clear, no wheezing, rales, normal symmetric air entry Abdomen: soft, non-tender, without masses or organomegaly EXT:no erythema, induration, or nodules   Lab Results: Lab Results  Component Value Date   WBC 6.0 09/24/2014   HGB 10.9* 09/24/2014   HCT 33.0* 09/24/2014   MCV 93.6 09/24/2014   PLT 126* 09/24/2014     Chemistry      Component Value Date/Time   NA 134* 10/23/2012 0419  K 4.1 10/23/2012 0419   CL 103 10/23/2012 0419   CO2 27 10/23/2012 0419   BUN 15 10/23/2012 0419   CREATININE 0.81 10/23/2012 0419      Component Value Date/Time   CALCIUM 8.0* 10/23/2012 0419   ALKPHOS 83 10/17/2012 1555   AST 21 10/17/2012 1555   ALT 11 10/17/2012 1555   BILITOT 0.4 10/17/2012 1555       Impression and Plan:  81 year old gentleman with the following issues. 1. Multifactorial anemia.  There is an element of anemia of renal insufficiency.  Currently on  Aranesp 300 mcg to keep his hemoglobin above 11. Hemoglobin is 10.9 and he will receive Aranesp today. The plan is to continue with the same regimen and check his counts every 2 months. 2. Prostate cancer. His PSA is being checked routinely by Dr. Karsten Ro and has been close to 0. 3. Follow-up. Aranesp injection every 8 weeks. Visit in 6 months.  Zola Button, MD 8/9/20161:19 PM

## 2014-09-27 DIAGNOSIS — M5412 Radiculopathy, cervical region: Secondary | ICD-10-CM | POA: Diagnosis not present

## 2014-09-27 DIAGNOSIS — M1812 Unilateral primary osteoarthritis of first carpometacarpal joint, left hand: Secondary | ICD-10-CM | POA: Diagnosis not present

## 2014-09-27 DIAGNOSIS — G5621 Lesion of ulnar nerve, right upper limb: Secondary | ICD-10-CM | POA: Diagnosis not present

## 2014-10-01 LAB — CUP PACEART REMOTE DEVICE CHECK
Battery Impedance: 734 Ohm
Battery Remaining Longevity: 55 mo
Battery Voltage: 2.77 V
Brady Statistic AP VP Percent: 3 %
Brady Statistic AS VS Percent: 28 %
Date Time Interrogation Session: 20160808175210
Lead Channel Impedance Value: 445 Ohm
Lead Channel Impedance Value: 567 Ohm
Lead Channel Pacing Threshold Amplitude: 0.875 V
Lead Channel Pacing Threshold Pulse Width: 0.4 ms
Lead Channel Sensing Intrinsic Amplitude: 2.8 mV
Lead Channel Sensing Intrinsic Amplitude: 5.6 mV
Lead Channel Setting Pacing Amplitude: 1.75 V
Lead Channel Setting Pacing Amplitude: 2.5 V
Lead Channel Setting Pacing Pulse Width: 0.4 ms
Lead Channel Setting Sensing Sensitivity: 2 mV
MDC IDC MSMT LEADCHNL RV PACING THRESHOLD AMPLITUDE: 1 V
MDC IDC MSMT LEADCHNL RV PACING THRESHOLD PULSEWIDTH: 0.4 ms
MDC IDC STAT BRADY AP VS PERCENT: 69 %
MDC IDC STAT BRADY AS VP PERCENT: 1 %

## 2014-10-03 DIAGNOSIS — G894 Chronic pain syndrome: Secondary | ICD-10-CM | POA: Diagnosis not present

## 2014-10-03 DIAGNOSIS — M4316 Spondylolisthesis, lumbar region: Secondary | ICD-10-CM | POA: Diagnosis not present

## 2014-10-03 DIAGNOSIS — Z79891 Long term (current) use of opiate analgesic: Secondary | ICD-10-CM | POA: Diagnosis not present

## 2014-10-03 DIAGNOSIS — M47816 Spondylosis without myelopathy or radiculopathy, lumbar region: Secondary | ICD-10-CM | POA: Diagnosis not present

## 2014-10-04 DIAGNOSIS — G5602 Carpal tunnel syndrome, left upper limb: Secondary | ICD-10-CM | POA: Diagnosis not present

## 2014-10-04 DIAGNOSIS — G5601 Carpal tunnel syndrome, right upper limb: Secondary | ICD-10-CM | POA: Diagnosis not present

## 2014-10-04 DIAGNOSIS — G5621 Lesion of ulnar nerve, right upper limb: Secondary | ICD-10-CM | POA: Diagnosis not present

## 2014-10-07 ENCOUNTER — Emergency Department (HOSPITAL_COMMUNITY)
Admission: EM | Admit: 2014-10-07 | Discharge: 2014-10-07 | Disposition: A | Discharge status: Home / Self Care | Payer: Medicare Other | Attending: Emergency Medicine | Admitting: Emergency Medicine

## 2014-10-07 ENCOUNTER — Encounter (HOSPITAL_COMMUNITY): Payer: Self-pay | Admitting: Vascular Surgery

## 2014-10-07 ENCOUNTER — Emergency Department (HOSPITAL_COMMUNITY): Payer: Medicare Other

## 2014-10-07 DIAGNOSIS — G4733 Obstructive sleep apnea (adult) (pediatric): Secondary | ICD-10-CM | POA: Insufficient documentation

## 2014-10-07 DIAGNOSIS — H9190 Unspecified hearing loss, unspecified ear: Secondary | ICD-10-CM | POA: Insufficient documentation

## 2014-10-07 DIAGNOSIS — G8929 Other chronic pain: Secondary | ICD-10-CM | POA: Insufficient documentation

## 2014-10-07 DIAGNOSIS — R001 Bradycardia, unspecified: Secondary | ICD-10-CM | POA: Diagnosis not present

## 2014-10-07 DIAGNOSIS — Z9981 Dependence on supplemental oxygen: Secondary | ICD-10-CM | POA: Insufficient documentation

## 2014-10-07 DIAGNOSIS — Z8546 Personal history of malignant neoplasm of prostate: Secondary | ICD-10-CM | POA: Diagnosis not present

## 2014-10-07 DIAGNOSIS — Z79899 Other long term (current) drug therapy: Secondary | ICD-10-CM | POA: Insufficient documentation

## 2014-10-07 DIAGNOSIS — J449 Chronic obstructive pulmonary disease, unspecified: Secondary | ICD-10-CM | POA: Insufficient documentation

## 2014-10-07 DIAGNOSIS — I10 Essential (primary) hypertension: Secondary | ICD-10-CM | POA: Insufficient documentation

## 2014-10-07 DIAGNOSIS — M1990 Unspecified osteoarthritis, unspecified site: Secondary | ICD-10-CM | POA: Diagnosis not present

## 2014-10-07 DIAGNOSIS — Z87891 Personal history of nicotine dependence: Secondary | ICD-10-CM | POA: Diagnosis not present

## 2014-10-07 DIAGNOSIS — K219 Gastro-esophageal reflux disease without esophagitis: Secondary | ICD-10-CM | POA: Diagnosis not present

## 2014-10-07 DIAGNOSIS — Z7982 Long term (current) use of aspirin: Secondary | ICD-10-CM | POA: Insufficient documentation

## 2014-10-07 LAB — BASIC METABOLIC PANEL
ANION GAP: 8 (ref 5–15)
BUN: 25 mg/dL — ABNORMAL HIGH (ref 6–20)
CALCIUM: 9.1 mg/dL (ref 8.9–10.3)
CO2: 26 mmol/L (ref 22–32)
Chloride: 105 mmol/L (ref 101–111)
Creatinine, Ser: 1.28 mg/dL — ABNORMAL HIGH (ref 0.61–1.24)
GFR, EST AFRICAN AMERICAN: 56 mL/min — AB (ref >60)
GFR, EST NON AFRICAN AMERICAN: 48 mL/min — AB (ref >60)
GLUCOSE: 101 mg/dL — AB (ref 65–99)
POTASSIUM: 4.1 mmol/L (ref 3.5–5.1)
Sodium: 139 mmol/L (ref 135–145)

## 2014-10-07 LAB — CBC
HEMATOCRIT: 34 % — AB (ref 39.0–52.0)
HEMOGLOBIN: 11 g/dL — AB (ref 13.0–17.0)
MCH: 30.9 pg (ref 26.0–34.0)
MCHC: 32.4 g/dL (ref 30.0–36.0)
MCV: 95.5 fL (ref 78.0–100.0)
Platelets: 142 10*3/uL — ABNORMAL LOW (ref 150–400)
RBC: 3.56 MIL/uL — AB (ref 4.22–5.81)
RDW: 14.7 % (ref 11.5–15.5)
WBC: 6.4 10*3/uL (ref 4.0–10.5)

## 2014-10-07 LAB — I-STAT TROPONIN, ED: TROPONIN I, POC: 0.01 ng/mL (ref 0.00–0.08)

## 2014-10-07 NOTE — ED Notes (Signed)
MD Miller at the bedside  

## 2014-10-07 NOTE — ED Provider Notes (Signed)
CSN: [id number]     Arrival date & time 10/07/14  1506 History   First MD Initiated Contact with Patient 10/07/14 1909     Chief Complaint  Patient presents with  . Bradycardia     (Consider location/radiation/quality/duration/timing/severity/associated sxs/prior Treatment) The history is provided by the patient and medical records.    The patient is an 81 year old male, he has a history of pacemaker placement approximately 5 or 7 years ago because of sinus node dysfunction. He presents to the hospital after being seen for a work physical, he was found to be bradycardic, he did not have any symptoms, he had no complaints, he was sent to the emergency department for further evaluation. The patient denies any symptoms including chest pain, lightheadedness, shortness of breath, weakness or changes in vision. He has been in his usual state of health, he did recently start Norvasc because of slight increase in blood pressure.  Past Medical History  Diagnosis Date  . Hypertension   . Hearing loss   . Pacemaker   . Leg swelling 10/19/2011    LLE  . COPD (chronic obstructive pulmonary disease) 10/19/2011    "touch"  . Exertional dyspnea 10/19/2011  . OSA on CPAP 10/19/2011    "sometimes wear CPAP"  . Prostate cancer     S/P radiation tx ~ 2008  . History of blood transfusion     "think it was related to pacemaker placement"  . GERD (gastroesophageal reflux disease)   . Arthritis     "back"  . Chronic lower back pain    Past Surgical History  Procedure Laterality Date  . Hernia repair  ~ 2010;     lap; VHR  . Hernia repair  ~ 7619    lap vh complicated by recurrence  . Hernia repair  09/2011    open; VHR  . Insert / replace / remove pacemaker  ~ 2009    initial placement  . Cataract extraction w/ intraocular lens  implant, bilateral    . Joint replacement    . Total knee arthroplasty  1990's    right  . Cholecystectomy  10/23/2011  . Cholecystectomy  10/23/2011    Procedure:  CHOLECYSTECTOMY;  Surgeon: Rolm Bookbinder, MD;  Location: [facility];  Service: General;  Laterality: N/A;  with intraoperative cholangiogram  . I&d extremity Left 10/20/2012    Procedure: LEFT LEG IRRIGATION AND DEBRIDEMENT, AND WOUND VAC APPLICATION;  Surgeon: Marianna Payment, MD;  Location: [facility];  Service: Orthopedics;  Laterality: Left;  . I&d extremity Left 10/22/2012    Procedure: IRRIGATION AND DEBRIDEMENT EXTREMITY;  Surgeon: Marianna Payment, MD;  Location: [facility];  Service: Orthopedics;  Laterality: Left;  . Skin split graft Left 10/22/2012    Procedure: SKIN GRAFT SPLIT THICKNESS;  Surgeon: Marianna Payment, MD;  Location: [facility];  Service: Orthopedics;  Laterality: Left;   Family History  Problem Relation Age of Onset  . Heart disease Father   . Heart disease Mother   . Colon cancer Mother    Social History  Substance Use Topics  . Smoking status: Former Smoker -- 1.50 packs/day for 35 years    Types: Cigarettes    Quit date: 02/15/1973  . Smokeless tobacco: Never Used  . Alcohol Use: No    Review of Systems  All other systems reviewed and are negative.     Allergies  Review of patient's allergies indicates no known allergies.  Home Medications   Prior to Admission medications  Medication Sig Start Date End Date Taking? Authorizing Provider  aspirin EC 81 MG tablet Take 81 mg by mouth daily.   Yes Historical Provider, MD  Calcium 600-200 MG-UNIT per tablet Take 1 tablet by mouth daily.   Yes Historical Provider, MD  darbepoetin alfa-polysorbate (ARANESP, ALB FREE, SURECLICK) 867 MCG/ML injection Inject 500 mcg into the skin See admin instructions. Every 2 months. Last dose was couple weeks ago from today(10-07-14)   Yes Historical Provider, MD  furosemide (LASIX) 40 MG tablet Take 40 mg by mouth 2 (two) times daily.    Yes Historical Provider, MD  lisinopril (PRINIVIL,ZESTRIL) 20 MG tablet Take 20 mg by mouth daily.    Yes Historical Provider, MD  Multiple  Vitamins-Minerals (PRESERVISION AREDS PO) Take 1 tablet by mouth 2 (two) times daily.   Yes Historical Provider, MD  Omega-3 Fatty Acids (FISH OIL) 1200 MG CAPS Take 1 capsule by mouth daily.     Yes Historical Provider, MD  omeprazole (PRILOSEC) 20 MG capsule Take 20 mg by mouth daily.    Yes Historical Provider, MD  oxybutynin (DITROPAN XL) 15 MG 24 hr tablet Take 15 mg by mouth every other day.    Yes Historical Provider, MD  oxyCODONE-acetaminophen (PERCOCET/ROXICET) 5-325 MG per tablet Take 1 tablet by mouth every 6 (six) hours as needed for moderate pain or severe pain.   Yes Historical Provider, MD  Umeclidinium-Vilanterol (ANORO ELLIPTA) 62.5-25 MCG/INH AEPB Inhale 1 puff into the lungs daily.    Yes Historical Provider, MD  vitamin B-12 (CYANOCOBALAMIN) 500 MCG tablet Take 500 mcg by mouth daily.    Yes Historical Provider, MD   BP 165/60 mmHg  Pulse 59  Temp(Src) 97.8 F (36.6 C) (Oral)  Resp 14  SpO2 99% Physical Exam  Constitutional: He appears well-developed and well-nourished. No distress.  HENT:  Head: Normocephalic and atraumatic.  Mouth/Throat: Oropharynx is clear and moist. No oropharyngeal exudate.  Eyes: Conjunctivae and EOM are normal. Pupils are equal, round, and reactive to light. Right eye exhibits no discharge. Left eye exhibits no discharge. No scleral icterus.  Neck: Normal range of motion. Neck supple. No JVD present. No thyromegaly present.  Cardiovascular: Normal rate, regular rhythm, normal heart sounds and intact distal pulses.  Exam reveals no gallop and no friction rub.   No murmur heard. Pulmonary/Chest: Effort normal and breath sounds normal. No respiratory distress. He has no wheezes. He has no rales.  Abdominal: Soft. Bowel sounds are normal. He exhibits no distension and no mass. There is no tenderness.  Musculoskeletal: Normal range of motion. He exhibits edema ( Bilateral pitting edema of the lower extremities, slight asymmetry with right greater than  left). He exhibits no tenderness.  Lymphadenopathy:    He has no cervical adenopathy.  Neurological: He is alert. Coordination normal.  Skin: Skin is warm and dry. No rash noted. No erythema.  Psychiatric: He has a normal mood and affect. His behavior is normal.  Nursing note and vitals reviewed.   ED Course  Procedures (including critical care time) Labs Review Labs Reviewed  BASIC METABOLIC PANEL - Abnormal; Notable for the following:    Glucose, Bld 101 (*)    BUN 25 (*)    Creatinine, Ser 1.28 (*)    GFR calc non Af Amer 48 (*)    GFR calc Af Amer 56 (*)    All other components within normal limits  CBC - Abnormal; Notable for the following:    RBC 3.56 (*)  Hemoglobin 11.0 (*)    HCT 34.0 (*)    Platelets 142 (*)    All other components within normal limits  I-STAT TROPOININ, ED    Imaging Review Dg Chest 2 View  10/07/2014   CLINICAL DATA:  Bradycardia, heart rate = 29, history dizziness, pacemaker, hypertension, COPD, prostate cancer, former smoker  EXAM: CHEST  2 VIEW  COMPARISON:  10/23/2012 ; correlation CT abdomen and pelvis 10/20/2011  FINDINGS: LEFT subclavian transvenous pacemaker leads project at RIGHT atrium and RIGHT ventricle, unchanged.  Normal heart size, mediastinal contours and pulmonary vascularity.  Suspect small hiatal hernia, confirmed on prior CT abdomen.  Emphysematous and bronchitic changes consistent with COPD.  No pulmonary infiltrate, pleural effusion or pneumothorax.  Probable BILATERAL nipple shadows, present on LEFT on earlier study of 11/03/2007 and bilaterally on earlier exam of 10/12/2006.  Scattered degenerative disc disease changes thoracic spine.  Old LEFT rib fractures.  Bones demineralized.  IMPRESSION: COPD changes.  No acute abnormalities.   Electronically Signed   By: Lavonia Dana M.D.   On: 10/07/2014 16:03   I have personally reviewed and evaluated these images and lab results as part of my medical decision-making.   EKG  Interpretation   Date/Time:  Monday October 07 2014 15:17:11 EDT Ventricular Rate:  63 PR Interval:  198 QRS Duration: 114 QT Interval:  418 QTC Calculation: 427 R Axis:   2 Text Interpretation:  Atrial-paced rhythm Nonspecific ST and T wave  abnormality Abnormal ECG since last tracing no significant change  Confirmed by Jamise Pentland  MD, Miqueas Whilden ([id number]) on 10/07/2014 7:24:27 PM      MDM   Final diagnoses:  Bradycardia    The patient speaks in full sentences, he has no complaints, his vital signs are unremarkable, his EKG is unremarkable, his chest x-rays unremarkable, his blood work is unremarkable. Will interrogate his pacemaker for further evaluation of whenever the potential bradycardic episode was. Again the patient has no symptoms and is on a cardiac monitor at this time.  Discussed care with the pacemaker company, they report no evidence of arrhythmias, the patient appears well, EKG unremarkable, vital signs with mild hypertension but nothing that needs intervention, stable for discharge.    Noemi Chapel, MD 10/07/14 2055

## 2014-10-07 NOTE — ED Notes (Addendum)
Pt reports to the ED for eval of bradycardia per PCP's office. Pt was having a physical performed and they found his HR to be 29 bpm. Pt is a client of Dr Caryl Comes and he called their office and was told to come here. Pts HR in the 60s on arrival, palpated and on EKG. Pt does have a pacemaker in place. Pt reports he has hx of dizziness x many years but denies any unusual dizziness. Also denies any lightheadedness or CP. He is A&Ox4, resp e/u, and skin warm and dry. 12 lead obtained in triage. 2 weeks ago pt was placed on Amlodipine.

## 2014-10-07 NOTE — ED Notes (Signed)
Pacemaker Interrogated. 

## 2014-10-07 NOTE — ED Notes (Signed)
Mitch, [employer] called and verified pacemaker reading.

## 2014-10-16 DIAGNOSIS — M4316 Spondylolisthesis, lumbar region: Secondary | ICD-10-CM | POA: Diagnosis not present

## 2014-10-16 DIAGNOSIS — Z79891 Long term (current) use of opiate analgesic: Secondary | ICD-10-CM | POA: Diagnosis not present

## 2014-10-16 DIAGNOSIS — M47816 Spondylosis without myelopathy or radiculopathy, lumbar region: Secondary | ICD-10-CM | POA: Diagnosis not present

## 2014-10-16 DIAGNOSIS — G894 Chronic pain syndrome: Secondary | ICD-10-CM | POA: Diagnosis not present

## 2014-10-25 ENCOUNTER — Encounter: Payer: Self-pay | Admitting: *Deleted

## 2014-10-25 DIAGNOSIS — G5621 Lesion of ulnar nerve, right upper limb: Secondary | ICD-10-CM | POA: Diagnosis not present

## 2014-10-25 DIAGNOSIS — G5602 Carpal tunnel syndrome, left upper limb: Secondary | ICD-10-CM | POA: Diagnosis not present

## 2014-10-25 DIAGNOSIS — G5601 Carpal tunnel syndrome, right upper limb: Secondary | ICD-10-CM | POA: Diagnosis not present

## 2014-10-31 ENCOUNTER — Other Ambulatory Visit: Payer: Self-pay | Admitting: Orthopedic Surgery

## 2014-11-04 ENCOUNTER — Ambulatory Visit: Payer: Medicare Other | Admitting: Interventional Cardiology

## 2014-11-05 DIAGNOSIS — M4806 Spinal stenosis, lumbar region: Secondary | ICD-10-CM | POA: Diagnosis not present

## 2014-11-05 DIAGNOSIS — M545 Low back pain: Secondary | ICD-10-CM | POA: Diagnosis not present

## 2014-11-05 DIAGNOSIS — G609 Hereditary and idiopathic neuropathy, unspecified: Secondary | ICD-10-CM | POA: Diagnosis not present

## 2014-11-05 DIAGNOSIS — M47816 Spondylosis without myelopathy or radiculopathy, lumbar region: Secondary | ICD-10-CM | POA: Diagnosis not present

## 2014-11-07 ENCOUNTER — Other Ambulatory Visit: Payer: Medicare Other

## 2014-11-07 ENCOUNTER — Other Ambulatory Visit: Payer: Self-pay | Admitting: Gastroenterology

## 2014-11-07 ENCOUNTER — Ambulatory Visit: Payer: Medicare Other

## 2014-11-08 ENCOUNTER — Encounter: Payer: Self-pay | Admitting: Interventional Cardiology

## 2014-11-12 ENCOUNTER — Encounter (HOSPITAL_BASED_OUTPATIENT_CLINIC_OR_DEPARTMENT_OTHER): Payer: Self-pay | Admitting: *Deleted

## 2014-11-12 NOTE — Progress Notes (Signed)
Chart reviewed by Dr Glennon Mac, [place] for [facility].

## 2014-11-13 ENCOUNTER — Encounter (HOSPITAL_BASED_OUTPATIENT_CLINIC_OR_DEPARTMENT_OTHER)
Admission: RE | Admit: 2014-11-13 | Discharge: 2014-11-13 | Disposition: A | Discharge status: Home / Self Care | Payer: Medicare Other | Source: Ambulatory Visit | Attending: Orthopedic Surgery | Admitting: Orthopedic Surgery

## 2014-11-13 DIAGNOSIS — M1812 Unilateral primary osteoarthritis of first carpometacarpal joint, left hand: Secondary | ICD-10-CM | POA: Diagnosis not present

## 2014-11-13 DIAGNOSIS — Z87891 Personal history of nicotine dependence: Secondary | ICD-10-CM | POA: Diagnosis not present

## 2014-11-13 DIAGNOSIS — G5602 Carpal tunnel syndrome, left upper limb: Secondary | ICD-10-CM | POA: Diagnosis not present

## 2014-11-13 DIAGNOSIS — I10 Essential (primary) hypertension: Secondary | ICD-10-CM | POA: Diagnosis not present

## 2014-11-13 DIAGNOSIS — J449 Chronic obstructive pulmonary disease, unspecified: Secondary | ICD-10-CM | POA: Diagnosis not present

## 2014-11-13 LAB — POCT I-STAT, CHEM 8
BUN: 32 mg/dL — ABNORMAL HIGH (ref 6–20)
CREATININE: 1.2 mg/dL (ref 0.61–1.24)
Calcium, Ion: 0.99 mmol/L — ABNORMAL LOW (ref 1.13–1.30)
Chloride: 108 mmol/L (ref 101–111)
Glucose, Bld: 122 mg/dL — ABNORMAL HIGH (ref 65–99)
HEMATOCRIT: 33 % — AB (ref 39.0–52.0)
HEMOGLOBIN: 11.2 g/dL — AB (ref 13.0–17.0)
POTASSIUM: 4.5 mmol/L (ref 3.5–5.1)
Sodium: 138 mmol/L (ref 135–145)
TCO2: 20 mmol/L (ref 0–100)

## 2014-11-14 ENCOUNTER — Encounter (HOSPITAL_BASED_OUTPATIENT_CLINIC_OR_DEPARTMENT_OTHER): Payer: Self-pay | Admitting: Certified Registered"

## 2014-11-14 ENCOUNTER — Ambulatory Visit (HOSPITAL_BASED_OUTPATIENT_CLINIC_OR_DEPARTMENT_OTHER): Payer: Medicare Other | Admitting: Certified Registered"

## 2014-11-14 ENCOUNTER — Ambulatory Visit (HOSPITAL_BASED_OUTPATIENT_CLINIC_OR_DEPARTMENT_OTHER)
Admission: RE | Admit: 2014-11-14 | Discharge: 2014-11-14 | Disposition: A | Discharge status: Home / Self Care | Payer: Medicare Other | Source: Ambulatory Visit | Attending: Orthopedic Surgery | Admitting: Orthopedic Surgery

## 2014-11-14 ENCOUNTER — Encounter (HOSPITAL_BASED_OUTPATIENT_CLINIC_OR_DEPARTMENT_OTHER)
Admission: RE | Disposition: A | Discharge status: Home / Self Care | Payer: Self-pay | Source: Ambulatory Visit | Attending: Orthopedic Surgery

## 2014-11-14 DIAGNOSIS — G5602 Carpal tunnel syndrome, left upper limb: Secondary | ICD-10-CM | POA: Insufficient documentation

## 2014-11-14 DIAGNOSIS — M1812 Unilateral primary osteoarthritis of first carpometacarpal joint, left hand: Secondary | ICD-10-CM | POA: Diagnosis not present

## 2014-11-14 DIAGNOSIS — J449 Chronic obstructive pulmonary disease, unspecified: Secondary | ICD-10-CM | POA: Diagnosis not present

## 2014-11-14 DIAGNOSIS — Z87891 Personal history of nicotine dependence: Secondary | ICD-10-CM | POA: Insufficient documentation

## 2014-11-14 DIAGNOSIS — I10 Essential (primary) hypertension: Secondary | ICD-10-CM | POA: Diagnosis not present

## 2014-11-14 DIAGNOSIS — M189 Osteoarthritis of first carpometacarpal joint, unspecified: Secondary | ICD-10-CM | POA: Diagnosis not present

## 2014-11-14 DIAGNOSIS — C61 Malignant neoplasm of prostate: Secondary | ICD-10-CM | POA: Diagnosis not present

## 2014-11-14 HISTORY — PX: CARPAL TUNNEL RELEASE: SHX101

## 2014-11-14 SURGERY — CARPAL TUNNEL RELEASE
Anesthesia: Monitor Anesthesia Care | Site: Hand | Laterality: Left

## 2014-11-14 MED ORDER — HYDROCODONE-ACETAMINOPHEN 5-325 MG PO TABS: 1.0000 | ORAL_TABLET | Freq: Four times a day (QID) | ORAL | Status: DC | PRN

## 2014-11-14 MED ORDER — FENTANYL CITRATE (PF) 100 MCG/2ML IJ SOLN
50.0000 ug | INTRAMUSCULAR | Status: DC | PRN
  Administered 2014-11-14: 50 ug via INTRAVENOUS

## 2014-11-14 MED ORDER — PROPOFOL 10 MG/ML IV BOLUS
INTRAVENOUS | Status: DC | PRN
  Administered 2014-11-14: 30 mg via INTRAVENOUS

## 2014-11-14 MED ORDER — MIDAZOLAM HCL 2 MG/2ML IJ SOLN: 1.0000 mg | INTRAMUSCULAR | Status: DC | PRN

## 2014-11-14 MED ORDER — CHLORHEXIDINE GLUCONATE 4 % EX LIQD: 60.0000 mL | Freq: Once | CUTANEOUS | Status: DC

## 2014-11-14 MED ORDER — ONDANSETRON HCL 4 MG/2ML IJ SOLN
INTRAMUSCULAR | Status: DC | PRN
  Administered 2014-11-14: 4 mg via INTRAVENOUS

## 2014-11-14 MED ORDER — CEFAZOLIN SODIUM-DEXTROSE 2-3 GM-% IV SOLR
2.0000 g | INTRAVENOUS | Status: AC
  Administered 2014-11-14: 2 g via INTRAVENOUS

## 2014-11-14 MED ORDER — LIDOCAINE HCL (PF) 1 % IJ SOLN
INTRAMUSCULAR | Status: DC | PRN
  Administered 2014-11-14: .5 mL

## 2014-11-14 MED ORDER — ONDANSETRON HCL 4 MG/2ML IJ SOLN
INTRAMUSCULAR | Status: AC
  Filled 2014-11-14: qty 2

## 2014-11-14 MED ORDER — GLYCOPYRROLATE 0.2 MG/ML IJ SOLN: 0.2000 mg | Freq: Once | INTRAMUSCULAR | Status: DC | PRN

## 2014-11-14 MED ORDER — BETAMETHASONE SOD PHOS & ACET 6 (3-3) MG/ML IJ SUSP
INTRAMUSCULAR | Status: DC | PRN
  Administered 2014-11-14: .5 mL

## 2014-11-14 MED ORDER — LIDOCAINE HCL (PF) 0.5 % IJ SOLN
INTRAMUSCULAR | Status: DC | PRN
  Administered 2014-11-14: 25 mL via INTRAVENOUS

## 2014-11-14 MED ORDER — LACTATED RINGERS IV SOLN
INTRAVENOUS | Status: DC
Start: 2014-11-14 — End: 2014-11-14
  Administered 2014-11-14: 12:00:00 via INTRAVENOUS

## 2014-11-14 MED ORDER — BETAMETHASONE SOD PHOS & ACET 6 (3-3) MG/ML IJ SUSP
INTRAMUSCULAR | Status: AC
  Filled 2014-11-14: qty 1

## 2014-11-14 MED ORDER — FENTANYL CITRATE (PF) 100 MCG/2ML IJ SOLN
INTRAMUSCULAR | Status: AC
  Filled 2014-11-14: qty 4

## 2014-11-14 MED ORDER — BUPIVACAINE HCL (PF) 0.25 % IJ SOLN
INTRAMUSCULAR | Status: DC | PRN
  Administered 2014-11-14: 6 mL

## 2014-11-14 MED ORDER — CEFAZOLIN SODIUM-DEXTROSE 2-3 GM-% IV SOLR: 2.0000 g | INTRAVENOUS | Status: DC

## 2014-11-14 MED ORDER — CEFAZOLIN SODIUM-DEXTROSE 2-3 GM-% IV SOLR
INTRAVENOUS | Status: AC
  Filled 2014-11-14: qty 50

## 2014-11-14 SURGICAL SUPPLY — 33 items
BLADE SURG 15 STRL LF DISP TIS (BLADE) ×1 IMPLANT
BLADE SURG 15 STRL SS (BLADE) ×3
BNDG CMPR 9X4 STRL LF SNTH (GAUZE/BANDAGES/DRESSINGS)
BNDG COHESIVE 3X5 TAN STRL LF (GAUZE/BANDAGES/DRESSINGS) ×3 IMPLANT
BNDG ESMARK 4X9 LF (GAUZE/BANDAGES/DRESSINGS) IMPLANT
BNDG GAUZE ELAST 4 BULKY (GAUZE/BANDAGES/DRESSINGS) ×3 IMPLANT
CHLORAPREP W/TINT 26ML (MISCELLANEOUS) ×3 IMPLANT
CORDS BIPOLAR (ELECTRODE) ×3 IMPLANT
COVER BACK TABLE 60X90IN (DRAPES) ×3 IMPLANT
COVER MAYO STAND STRL (DRAPES) ×3 IMPLANT
CUFF TOURNIQUET SINGLE 18IN (TOURNIQUET CUFF) ×3 IMPLANT
DRAPE EXTREMITY T 121X128X90 (DRAPE) ×3 IMPLANT
DRAPE SURG 17X23 STRL (DRAPES) ×3 IMPLANT
DRSG PAD ABDOMINAL 8X10 ST (GAUZE/BANDAGES/DRESSINGS) ×3 IMPLANT
GAUZE SPONGE 4X4 12PLY STRL (GAUZE/BANDAGES/DRESSINGS) ×3 IMPLANT
GAUZE XEROFORM 1X8 LF (GAUZE/BANDAGES/DRESSINGS) ×3 IMPLANT
GLOVE BIOGEL PI IND STRL 8.5 (GLOVE) ×1 IMPLANT
GLOVE BIOGEL PI INDICATOR 8.5 (GLOVE) ×2
GLOVE SURG ORTHO 8.0 STRL STRW (GLOVE) ×3 IMPLANT
GOWN STRL REUS W/ TWL LRG LVL3 (GOWN DISPOSABLE) ×1 IMPLANT
GOWN STRL REUS W/TWL LRG LVL3 (GOWN DISPOSABLE) ×3
GOWN STRL REUS W/TWL XL LVL3 (GOWN DISPOSABLE) ×3 IMPLANT
NDL PRECISIONGLIDE 27X1.5 (NEEDLE) IMPLANT
NEEDLE PRECISIONGLIDE 27X1.5 (NEEDLE) IMPLANT
NS IRRIG 1000ML POUR BTL (IV SOLUTION) ×3 IMPLANT
PACK BASIN DAY SURGERY FS (CUSTOM PROCEDURE TRAY) ×3 IMPLANT
STOCKINETTE 4X48 STRL (DRAPES) ×3 IMPLANT
SUT ETHILON 4 0 PS 2 18 (SUTURE) ×3 IMPLANT
SUT VICRYL 4-0 PS2 18IN ABS (SUTURE) IMPLANT
SYR BULB 3OZ (MISCELLANEOUS) ×3 IMPLANT
SYR CONTROL 10ML LL (SYRINGE) IMPLANT
TOWEL OR 17X24 6PK STRL BLUE (TOWEL DISPOSABLE) ×3 IMPLANT
UNDERPAD 30X30 (UNDERPADS AND DIAPERS) ×3 IMPLANT

## 2014-11-14 NOTE — Discharge Instructions (Addendum)

## 2014-11-14 NOTE — Op Note (Signed)
NAME:  [name] [name], [name] [name] NO.:  [id number]  MEDICAL RECORD NO.:  [id number]  LOCATION:                                 FACILITY:  PHYSICIAN:  Daryll Brod, M.D.       DATE OF BIRTH:  02/18/1924  DATE OF PROCEDURE:  11/14/2014 DATE OF DISCHARGE:                              OPERATIVE REPORT   PREOPERATIVE DIAGNOSIS:  Carpal tunnel syndrome, left hand with CMC arthritis, left thumb.  POSTOPERATIVE DIAGNOSIS:  Carpal tunnel syndrome, left hand with CMC arthritis, left thumb.  OPERATION:  Injection, carpometacarpal joint of left thumb with Celestone and Xylocaine.  Open carpal tunnel release, left hand.  SURGEON:  Daryll Brod, MD.  ANESTHESIA:  Forearm-based IV regional with local infiltration.  DATE OF OPERATION:  November 14, 2014.  HISTORY:  The patient is an 81 year old male with a history of significant CMC arthritis of his left hand and carpal tunnel syndrome. Nerve conduction is positive, which has not responded to conservative treatment.  He has elected to undergo surgical decompression to the median nerve.  Preoperative, perioperative, and postoperative courses have been discussed along with risks and complications.  He is aware that there is no guarantee with the surgery, possibility of infection; recurrence of injury to arteries, nerves, tendons; incomplete relief of symptoms; dystrophy.  In preoperative area, the patient is seen, the extremity was marked by both the patient and surgeon.  Antibiotic given. We would also like to have the carpometacarpal joint injected at the same time while he was anesthetized.  PROCEDURE IN DETAIL:  The patient was brought to the operating room, where a forearm-based IV regional anesthetic was carried out without difficulty.  He was prepped using ChloraPrep, supine position with the left arm free.  A 3-minute dry time was allowed.  A time-out was taken confirming the patient and procedure.  The carpometacarpal joint  was then localized with palpation.  This was then injected with 1% Xylocaine without epinephrine and Celestone 0.5 mL of each.  A longitudinal incision was then made in the palm, carried down through subcutaneous tissue.  Bleeders were electrocauterized with bipolar.  Palmar fascia was split.  Superficial palmar arch identified.  The flexor tendon to the ring and little finger identified to the ulnar side of the median nerve.  The carpal retinaculum was incised with sharp dissection.  Right angle and Sewall retractor were placed between the skin and forearm fascia.  The fascia was released for approximately 2 cm proximal to the wrist crease under direct vision.  Canal was explored, and a persistent median artery was present to the nerve.  No further lesions were identified.  Motor branch entered into muscle distally.  The wound was copiously irrigated with saline, and the skin was closed with interrupted 4-0 nylon sutures.  Local infiltration with 0.25% bupivacaine without epinephrine was given, 6 mL was used.  Sterile compressive dressing with the fingers free was applied.  On deflation of the tourniquet, all fingers immediately pinked.  He was taken to the recovery room for observation in satisfactory condition.  He will be discharged home to return to [place] in one week on Norco.  ______________________________ Daryll Brod, M.D.     GK/MEDQ  D:  11/14/2014  T:  11/14/2014  Job:  [id number]

## 2014-11-14 NOTE — Anesthesia Preprocedure Evaluation (Addendum)
Anesthesia Evaluation  Patient identified by MRN, date of birth, ID band Patient awake    Reviewed: Allergy & Precautions, H&P , NPO status , Patient's Chart, lab work & pertinent test results  History of Anesthesia Complications Negative for: history of anesthetic complications  Airway Mallampati: I  TM Distance: >3 FB Neck ROM: Full    Dental  (+) Dental Advisory Given, Edentulous Upper, Edentulous Lower   Pulmonary shortness of breath and with exertion, sleep apnea and Continuous Positive Airway Pressure Ventilation , COPD, former smoker,    + rhonchi        Cardiovascular hypertension, Pt. on medications + DVT  (-) Peripheral Vascular Disease + dysrhythmias Atrial Fibrillation + pacemaker  Rhythm:Regular Rate:Normal  Echo 10/2011 Study Conclusions  - Left ventricle: The cavity size was normal. There was mild  concentric hypertrophy. Systolic function was mildly to  moderately reduced. The estimated ejection fraction was in  the range of 40% to 45%. Severe hypokinesis to akinesis of  the basal-mid inferior myocardium. - Aortic valve: Trivial regurgitation. - Mitral valve: Calcified annulus. Mild regurgitation. - Left atrium: The atrium was mildly dilated. - Atrial septum: No defect or patent foramen ovale was   identified. - Pulmonary arteries: PA peak pressure: 36mm Hg (S).   Pacer:  [employer], interrogation 09/2014 DDDR, 3% AP-VP, 69% AP-VS, 26% AS-VS Longevity 4.5 yrs.    Neuro/Psych negative neurological ROS  negative psych ROS   GI/Hepatic Neg liver ROS, GERD  Medicated and Controlled,  Endo/Other  negative endocrine ROS  Renal/GU negative Renal ROS     Musculoskeletal  (+) Arthritis ,   Abdominal   Peds  Hematology negative hematology ROS (+) anemia ,   Anesthesia Other Findings   Reproductive/Obstetrics                            Anesthesia Physical  Anesthesia  Plan  ASA: III  Anesthesia Plan: MAC and Bier Block   Post-op Pain Management:    Induction: Intravenous  Airway Management Planned:   Additional Equipment:   Intra-op Plan:   Post-operative Plan:   Informed Consent: I have reviewed the patients History and Physical, chart, labs and discussed the procedure including the risks, benefits and alternatives for the proposed anesthesia with the patient or authorized representative who has indicated his/her understanding and acceptance.   Dental advisory given  Plan Discussed with: CRNA  Anesthesia Plan Comments:         Anesthesia Quick Evaluation

## 2014-11-14 NOTE — Anesthesia Postprocedure Evaluation (Signed)
Anesthesia Post Note  Patient: [name] [name]  Procedure(s) Performed: Procedure(s) (LRB): CARPAL TUNNEL RELEASE LEFT THUMB (Left)  Anesthesia type: MAC + bier block  Patient location: PACU  Post pain: Pain level controlled  Post assessment: Post-op Vital signs reviewed  Last Vitals: BP 175/74 mmHg  Pulse 68  Temp(Src) 36.4 C (Oral)  Resp 20  Ht 5\' 9"  (1.753 m)  Wt 182 lb 6 oz (82.725 kg)  BMI 26.92 kg/m2  SpO2 100%  Post vital signs: Reviewed  Level of consciousness: awake  Complications: No apparent anesthesia complications

## 2014-11-14 NOTE — H&P (Signed)
[name]  is 81 years old. He is right handed. He has carpal tunnel syndrome on his left side. Nerve conductions are positive with motor delay of 4.0 on the left, 4.3 on the right, sensory delay of 2.6 on the left and 2.6 on the right with amplitude diminution to 15 on the left and 18 on the right. He has had a good response to the injection on his left side but has decided he would like to proceed with left carpal tunnel release. He would also like the [facility] joint injected at that time.  PAST MEDICAL HISTORY:  He has no known drug allergies. He is on the following medications: Anoro Ellipta, Aranesp, aspirin, calcium and vitamin D, fish oil, folic acid, furosemide, Lisinopril, Preservision Areds, multivitamins, omeprazole, Tramadol and vitamin B12. He has had dog bites treated be me to this left hand, hernia repairs times 6.  FAMILY MEDICAL HISTORY: Positive for high BP and arthritis.  SOCIAL HISTORY:  He does not smoke or use alcohol. He is retired.   REVIEW OF SYSTEMS: Positive for glasses, hearing loss, high BP, balance problems, easy bruising otherwise negative 14 points.  [name] [name] is an 81 y.o. male.   Chief Complaint: CTS left with [facility] arthritis left thumb HPI: see above  Past Medical History  Diagnosis Date  . Hypertension   . Hearing loss   . Pacemaker   . Leg swelling 10/19/2011    LLE  . COPD (chronic obstructive pulmonary disease) 10/19/2011    "touch"  . Exertional dyspnea 10/19/2011  . OSA on CPAP 10/19/2011    "sometimes wear CPAP"  . Prostate cancer     S/P radiation tx ~ 2008  . History of blood transfusion     "think it was related to pacemaker placement"  . GERD (gastroesophageal reflux disease)   . Arthritis     "back"  . Chronic lower back pain     Past Surgical History  Procedure Laterality Date  . Hernia repair  ~ 2010;     lap; VHR  . Hernia repair  ~ 1610    lap vh complicated by recurrence  . Hernia repair  09/2011    open; VHR  . Insert /  replace / remove pacemaker  ~ 2009    initial placement  . Cataract extraction w/ intraocular lens  implant, bilateral    . Joint replacement    . Total knee arthroplasty  1990's    right  . Cholecystectomy  10/23/2011  . Cholecystectomy  10/23/2011    Procedure: CHOLECYSTECTOMY;  Surgeon: Rolm Bookbinder, MD;  Location: [facility];  Service: General;  Laterality: N/A;  with intraoperative cholangiogram  . I&d extremity Left 10/20/2012    Procedure: LEFT LEG IRRIGATION AND DEBRIDEMENT, AND WOUND VAC APPLICATION;  Surgeon: Marianna Payment, MD;  Location: [facility];  Service: Orthopedics;  Laterality: Left;  . I&d extremity Left 10/22/2012    Procedure: IRRIGATION AND DEBRIDEMENT EXTREMITY;  Surgeon: Marianna Payment, MD;  Location: [facility];  Service: Orthopedics;  Laterality: Left;  . Skin split graft Left 10/22/2012    Procedure: SKIN GRAFT SPLIT THICKNESS;  Surgeon: Marianna Payment, MD;  Location: [facility];  Service: Orthopedics;  Laterality: Left;    Family History  Problem Relation Age of Onset  . Heart disease Father   . Heart disease Mother   . Colon cancer Mother    Social History:  reports that he quit smoking about 41 years ago. His smoking  use included Cigarettes. He has a 52.5 pack-year smoking history. He has never used smokeless tobacco. He reports that he does not drink alcohol or use illicit drugs.  Allergies: No Known Allergies  Medications Prior to Admission  Medication Sig Dispense Refill  . aspirin EC 81 MG tablet Take 81 mg by mouth daily.    . Calcium 600-200 MG-UNIT per tablet Take 1 tablet by mouth daily.    . darbepoetin alfa-polysorbate (ARANESP, ALB FREE, SURECLICK) 409 MCG/ML injection Inject 500 mcg into the skin See admin instructions. Every 2 months. Last dose was couple weeks ago from today(10-07-14)    . furosemide (LASIX) 40 MG tablet Take 40 mg by mouth 2 (two) times daily.     Marland Kitchen lisinopril (PRINIVIL,ZESTRIL) 20 MG tablet Take 20 mg by mouth daily.     .  Multiple Vitamins-Minerals (PRESERVISION AREDS PO) Take 1 tablet by mouth 2 (two) times daily.    . Omega-3 Fatty Acids (FISH OIL) 1200 MG CAPS Take 1 capsule by mouth daily.      Marland Kitchen omeprazole (PRILOSEC) 20 MG capsule Take 20 mg by mouth daily.     Marland Kitchen oxybutynin (DITROPAN XL) 15 MG 24 hr tablet Take 15 mg by mouth every other day.     . oxyCODONE-acetaminophen (PERCOCET/ROXICET) 5-325 MG per tablet Take 1 tablet by mouth every 6 (six) hours as needed for moderate pain or severe pain.    Marland Kitchen Umeclidinium-Vilanterol (ANORO ELLIPTA) 62.5-25 MCG/INH AEPB Inhale 1 puff into the lungs daily.     . vitamin B-12 (CYANOCOBALAMIN) 500 MCG tablet Take 500 mcg by mouth daily.       Results for orders placed or performed during the hospital encounter of 11/14/14 (from the past 48 hour(s))  I-STAT, chem 8     Status: Abnormal   Collection Time: 11/13/14  3:04 PM  Result Value Ref Range   Sodium 138 135 - 145 mmol/L   Potassium 4.5 3.5 - 5.1 mmol/L   Chloride 108 101 - 111 mmol/L   BUN 32 (H) 6 - 20 mg/dL   Creatinine, Ser 1.20 0.61 - 1.24 mg/dL   Glucose, Bld 122 (H) 65 - 99 mg/dL   Calcium, Ion 0.99 (L) 1.13 - 1.30 mmol/L   TCO2 20 0 - 100 mmol/L   Hemoglobin 11.2 (L) 13.0 - 17.0 g/dL   HCT 33.0 (L) 39.0 - 52.0 %    No results found.   Pertinent items are noted in HPI.  Blood pressure 168/65, pulse 64, temperature 97.7 F (36.5 C), temperature source Oral, resp. rate 18, height 5\' 9"  (1.753 m), weight 82.725 kg (182 lb 6 oz), SpO2 100 %.  General appearance: alert, cooperative and appears stated age Head: Normocephalic, without obvious abnormality Neck: no JVD Resp: clear to auscultation bilaterally Cardio: regular rate and rhythm, S1, S2 normal, no murmur, click, rub or gallop GI: soft, non-tender; bowel sounds normal; no masses,  no organomegaly Extremities: numbness left hand and pain in the thumb Pulses: 2+ and symmetric Skin: Skin color, texture, turgor normal. No rashes or  lesions Neurologic: Grossly normal Incision/Wound: na  Assessment/Plan DIAGNOSIS: Carpal tunnel syndrome bilaterally symptomatic more on the left than right at the present time.  He wishes to proceed. He also has [place] arthritis on the left side and would like to have this injected at the time of his carpal tunnel release. Pre, peri and post op care are discussed along with risks and complications. Patient is aware there is no guarantee with  surgery, possibility of infection, injury to arteries, nerves, and tendons, incomplete relief and dystrophy. He is scheduled for left carpal tunnel release and injection [facility] joint left thumb as an outpatient under regional anesthesia.  Sapphire Tygart R 11/14/2014, 11:20 AM

## 2014-11-14 NOTE — Anesthesia Procedure Notes (Signed)
Procedure Name: MAC Date/Time: 11/14/2014 12:52 PM Performed by: Baxter Flattery Pre-anesthesia Checklist: Patient identified, Emergency Drugs available, Suction available and Patient being monitored Patient Re-evaluated:Patient Re-evaluated prior to inductionOxygen Delivery Method: Simple face mask Preoxygenation: Pre-oxygenation with 100% oxygen Intubation Type: IV induction Ventilation: Mask ventilation without difficulty Dental Injury: Teeth and Oropharynx as per pre-operative assessment

## 2014-11-14 NOTE — Transfer of Care (Signed)
Immediate Anesthesia Transfer of Care Note  Patient: [name] [name]  Procedure(s) Performed: Procedure(s) with comments: CARPAL TUNNEL RELEASE LEFT THUMB (Left) - ANESTHESIA:  IV REGIONAL FAB  Patient Location: PACU  Anesthesia Type:MAC and Bier block  Level of Consciousness: awake, alert  and oriented  Airway & Oxygen Therapy: Patient Spontanous Breathing and Patient connected to face mask oxygen  Post-op Assessment: Report given to RN, Post -op Vital signs reviewed and stable and Patient moving all extremities  Post vital signs: Reviewed and stable  Last Vitals:  Filed Vitals:   11/14/14 1059  BP: 168/65  Pulse: 64  Temp: 36.5 C  Resp: 18    Complications: No apparent anesthesia complications

## 2014-11-14 NOTE — Brief Op Note (Signed)
11/14/2014  1:20 PM  PATIENT:  [name] [name]  81 y.o. male  PRE-OPERATIVE DIAGNOSIS:   CARPAL TUNNEL LEFT, CARPOMETACARPAL ARTHRITIS LEFT THUMB  POST-OPERATIVE DIAGNOSIS:  CARPAL TUNNEL LEFT, CARPOMETACARPAL ARTHRITIS LEFT THUMB  PROCEDURE:  Procedure(s) with comments: CARPAL TUNNEL RELEASE LEFT THUMB (Left) - ANESTHESIA:  IV REGIONAL FAB  SURGEON:  Surgeon(s) and Role:    * Daryll Brod, MD - Primary  PHYSICIAN ASSISTANT:   ASSISTANTS: none   ANESTHESIA:   local and regional  EBL:  Total I/O In: 700 [I.V.:700] Out: -   BLOOD ADMINISTERED:none  DRAINS: none   LOCAL MEDICATIONS USED:  BUPIVICAINE   SPECIMEN:  No Specimen  DISPOSITION OF SPECIMEN:  N/A  COUNTS:  YES  TOURNIQUET:   Total Tourniquet Time Documented: Forearm (Left) - 20 minutes Total: Forearm (Left) - 20 minutes   DICTATION: .Other Dictation: Dictation Number [id number]  PLAN OF CARE: Discharge to home after PACU  PATIENT DISPOSITION:  PACU - hemodynamically stable.

## 2014-11-14 NOTE — Op Note (Signed)
Dictation Number [id number]

## 2014-11-15 ENCOUNTER — Telehealth: Payer: Self-pay | Admitting: Pulmonary Disease

## 2014-11-15 NOTE — Telephone Encounter (Signed)
Left message for pt to call back  °

## 2014-11-18 ENCOUNTER — Encounter (HOSPITAL_BASED_OUTPATIENT_CLINIC_OR_DEPARTMENT_OTHER): Payer: Self-pay | Admitting: Orthopedic Surgery

## 2014-11-18 ENCOUNTER — Other Ambulatory Visit: Payer: Self-pay | Admitting: Geriatric Medicine

## 2014-11-18 DIAGNOSIS — R51 Headache: Principal | ICD-10-CM

## 2014-11-18 DIAGNOSIS — I10 Essential (primary) hypertension: Secondary | ICD-10-CM | POA: Diagnosis not present

## 2014-11-18 DIAGNOSIS — Z23 Encounter for immunization: Secondary | ICD-10-CM | POA: Diagnosis not present

## 2014-11-18 DIAGNOSIS — D692 Other nonthrombocytopenic purpura: Secondary | ICD-10-CM | POA: Diagnosis not present

## 2014-11-18 DIAGNOSIS — R2689 Other abnormalities of gait and mobility: Secondary | ICD-10-CM | POA: Diagnosis not present

## 2014-11-18 DIAGNOSIS — Z79899 Other long term (current) drug therapy: Secondary | ICD-10-CM | POA: Diagnosis not present

## 2014-11-18 DIAGNOSIS — G4700 Insomnia, unspecified: Secondary | ICD-10-CM | POA: Diagnosis not present

## 2014-11-18 DIAGNOSIS — R519 Headache, unspecified: Secondary | ICD-10-CM

## 2014-11-18 DIAGNOSIS — K5900 Constipation, unspecified: Secondary | ICD-10-CM | POA: Diagnosis not present

## 2014-11-18 NOTE — Telephone Encounter (Signed)
Called and spoke to pt. Pt is requesting an appt with RA. Advised pt that RA's schedule is booked up till January and advised pt to call back to see if there have been any cancellations. Pt stated he was dissatisfied he does not have an appt yet. Informed pt that we can schedule pt with another provider, pt refused. Another recall placed. Pt verbalized understanding and denied any further questions or concerns at this time.

## 2014-11-19 ENCOUNTER — Other Ambulatory Visit (HOSPITAL_BASED_OUTPATIENT_CLINIC_OR_DEPARTMENT_OTHER): Payer: Medicare Other

## 2014-11-19 ENCOUNTER — Ambulatory Visit (HOSPITAL_BASED_OUTPATIENT_CLINIC_OR_DEPARTMENT_OTHER): Payer: Medicare Other

## 2014-11-19 VITALS — BP 168/70 | HR 70 | Temp 98.2°F | Resp 18

## 2014-11-19 DIAGNOSIS — C61 Malignant neoplasm of prostate: Secondary | ICD-10-CM

## 2014-11-19 DIAGNOSIS — D649 Anemia, unspecified: Secondary | ICD-10-CM

## 2014-11-19 DIAGNOSIS — N289 Disorder of kidney and ureter, unspecified: Secondary | ICD-10-CM

## 2014-11-19 DIAGNOSIS — D631 Anemia in chronic kidney disease: Secondary | ICD-10-CM

## 2014-11-19 DIAGNOSIS — N189 Chronic kidney disease, unspecified: Principal | ICD-10-CM

## 2014-11-19 DIAGNOSIS — J449 Chronic obstructive pulmonary disease, unspecified: Secondary | ICD-10-CM

## 2014-11-19 DIAGNOSIS — K432 Incisional hernia without obstruction or gangrene: Secondary | ICD-10-CM

## 2014-11-19 LAB — CBC WITH DIFFERENTIAL/PLATELET
BASO%: 0.6 % (ref 0.0–2.0)
Basophils Absolute: 0 10*3/uL (ref 0.0–0.1)
EOS%: 3.3 % (ref 0.0–7.0)
Eosinophils Absolute: 0.2 10*3/uL (ref 0.0–0.5)
HCT: 32.8 % — ABNORMAL LOW (ref 38.4–49.9)
HGB: 10.8 g/dL — ABNORMAL LOW (ref 13.0–17.1)
LYMPH%: 13.1 % — AB (ref 14.0–49.0)
MCH: 30.9 pg (ref 27.2–33.4)
MCHC: 32.8 g/dL (ref 32.0–36.0)
MCV: 94.3 fL (ref 79.3–98.0)
MONO#: 0.3 10*3/uL (ref 0.1–0.9)
MONO%: 5.4 % (ref 0.0–14.0)
NEUT%: 77.6 % — ABNORMAL HIGH (ref 39.0–75.0)
NEUTROS ABS: 4.8 10*3/uL (ref 1.5–6.5)
Platelets: 145 10*3/uL (ref 140–400)
RBC: 3.48 10*6/uL — AB (ref 4.20–5.82)
RDW: 14.7 % — ABNORMAL HIGH (ref 11.0–14.6)
WBC: 6.2 10*3/uL (ref 4.0–10.3)
lymph#: 0.8 10*3/uL — ABNORMAL LOW (ref 0.9–3.3)

## 2014-11-19 MED ORDER — DARBEPOETIN ALFA 300 MCG/0.6ML IJ SOSY
300.0000 ug | PREFILLED_SYRINGE | Freq: Once | INTRAMUSCULAR | Status: AC
  Administered 2014-11-19: 300 ug via SUBCUTANEOUS
  Filled 2014-11-19: qty 0.6

## 2014-11-19 NOTE — Patient Instructions (Signed)
Darbepoetin Alfa injection What is this medicine? DARBEPOETIN ALFA (dar be POE e tin AL fa) helps your body make more red blood cells. It is used to treat anemia caused by chronic kidney failure and chemotherapy. This medicine may be used for other purposes; ask your health care provider or pharmacist if you have questions. COMMON BRAND NAME(S): Aranesp What should I tell my health care provider before I take this medicine? They need to know if you have any of these conditions: -blood clotting disorders or history of blood clots -cancer patient not on chemotherapy -cystic fibrosis -heart disease, such as angina, heart failure, or a history of a heart attack -hemoglobin level of 12 g/dL or greater -high blood pressure -low levels of folate, iron, or vitamin B12 -seizures -an unusual or allergic reaction to darbepoetin, erythropoietin, albumin, hamster proteins, latex, other medicines, foods, dyes, or preservatives -pregnant or trying to get pregnant -breast-feeding How should I use this medicine? This medicine is for injection into a vein or under the skin. It is usually given by a health care professional in a hospital or clinic setting. If you get this medicine at home, you will be taught how to prepare and give this medicine. Do not shake the solution before you withdraw a dose. Use exactly as directed. Take your medicine at regular intervals. Do not take your medicine more often than directed. It is important that you put your used needles and syringes in a special sharps container. Do not put them in a trash can. If you do not have a sharps container, call your pharmacist or healthcare provider to get one. Talk to your pediatrician regarding the use of this medicine in children. While this medicine may be used in children as young as 1 year for selected conditions, precautions do apply. Overdosage: If you think you have taken too much of this medicine contact a poison control center or  emergency room at once. NOTE: This medicine is only for you. Do not share this medicine with others. What if I miss a dose? If you miss a dose, take it as soon as you can. If it is almost time for your next dose, take only that dose. Do not take double or extra doses. What may interact with this medicine? Do not take this medicine with any of the following medications: -epoetin alfa This list may not describe all possible interactions. Give your health care provider a list of all the medicines, herbs, non-prescription drugs, or dietary supplements you use. Also tell them if you smoke, drink alcohol, or use illegal drugs. Some items may interact with your medicine. What should I watch for while using this medicine? Visit your prescriber or health care professional for regular checks on your progress and for the needed blood tests and blood pressure measurements. It is especially important for the doctor to make sure your hemoglobin level is in the desired range, to limit the risk of potential side effects and to give you the best benefit. Keep all appointments for any recommended tests. Check your blood pressure as directed. Ask your doctor what your blood pressure should be and when you should contact him or her. As your body makes more red blood cells, you may need to take iron, folic acid, or vitamin B supplements. Ask your doctor or health care provider which products are right for you. If you have kidney disease continue dietary restrictions, even though this medication can make you feel better. Talk with your doctor or health   care professional about the foods you eat and the vitamins that you take. What side effects may I notice from receiving this medicine? Side effects that you should report to your doctor or health care professional as soon as possible: -allergic reactions like skin rash, itching or hives, swelling of the face, lips, or tongue -breathing problems -changes in vision -chest  pain -confusion, trouble speaking or understanding -feeling faint or lightheaded, falls -high blood pressure -muscle aches or pains -pain, swelling, warmth in the leg -rapid weight gain -severe headaches -sudden numbness or weakness of the face, arm or leg -trouble walking, dizziness, loss of balance or coordination -seizures (convulsions) -swelling of the ankles, feet, hands -unusually weak or tired Side effects that usually do not require medical attention (report to your doctor or health care professional if they continue or are bothersome): -diarrhea -fever, chills (flu-like symptoms) -headaches -nausea, vomiting -redness, stinging, or swelling at site where injected This list may not describe all possible side effects. Call your doctor for medical advice about side effects. You may report side effects to FDA at 1-800-FDA-1088. Where should I keep my medicine? Keep out of the reach of children. Store in a refrigerator between 2 and 8 degrees C (36 and 46 degrees F). Do not freeze. Do not shake. Throw away any unused portion if using a single-dose vial. Throw away any unused medicine after the expiration date. NOTE: This sheet is a summary. It may not cover all possible information. If you have questions about this medicine, talk to your doctor, pharmacist, or health care provider.  2015, Elsevier/Gold Standard. (2008-01-16 10:23:57)  

## 2014-11-22 ENCOUNTER — Ambulatory Visit
Admission: RE | Admit: 2014-11-22 | Discharge: 2014-11-22 | Disposition: A | Discharge status: Home / Self Care | Payer: Medicare Other | Source: Ambulatory Visit | Attending: Geriatric Medicine | Admitting: Geriatric Medicine

## 2014-11-22 DIAGNOSIS — R519 Headache, unspecified: Secondary | ICD-10-CM

## 2014-11-22 DIAGNOSIS — R51 Headache: Secondary | ICD-10-CM | POA: Diagnosis not present

## 2014-11-29 DIAGNOSIS — M4316 Spondylolisthesis, lumbar region: Secondary | ICD-10-CM | POA: Diagnosis not present

## 2014-11-29 DIAGNOSIS — Z79891 Long term (current) use of opiate analgesic: Secondary | ICD-10-CM | POA: Diagnosis not present

## 2014-11-29 DIAGNOSIS — G894 Chronic pain syndrome: Secondary | ICD-10-CM | POA: Diagnosis not present

## 2014-11-29 DIAGNOSIS — R159 Full incontinence of feces: Secondary | ICD-10-CM | POA: Diagnosis not present

## 2014-11-29 DIAGNOSIS — I10 Essential (primary) hypertension: Secondary | ICD-10-CM | POA: Diagnosis not present

## 2014-11-29 DIAGNOSIS — M4806 Spinal stenosis, lumbar region: Secondary | ICD-10-CM | POA: Diagnosis not present

## 2014-12-03 ENCOUNTER — Ambulatory Visit (INDEPENDENT_AMBULATORY_CARE_PROVIDER_SITE_OTHER): Payer: Medicare Other | Admitting: Interventional Cardiology

## 2014-12-03 ENCOUNTER — Encounter: Payer: Self-pay | Admitting: Interventional Cardiology

## 2014-12-03 VITALS — BP 130/60 | HR 61 | Ht 69.0 in | Wt 181.0 lb

## 2014-12-03 DIAGNOSIS — I480 Paroxysmal atrial fibrillation: Secondary | ICD-10-CM | POA: Diagnosis not present

## 2014-12-03 DIAGNOSIS — Z950 Presence of cardiac pacemaker: Secondary | ICD-10-CM | POA: Diagnosis not present

## 2014-12-03 DIAGNOSIS — I10 Essential (primary) hypertension: Secondary | ICD-10-CM

## 2014-12-03 DIAGNOSIS — R0609 Other forms of dyspnea: Secondary | ICD-10-CM

## 2014-12-03 DIAGNOSIS — R931 Abnormal findings on diagnostic imaging of heart and coronary circulation: Secondary | ICD-10-CM

## 2014-12-03 DIAGNOSIS — R600 Localized edema: Secondary | ICD-10-CM

## 2014-12-03 NOTE — Progress Notes (Signed)
Patient ID: [name] [name], male   DOB: 03-30-1924, 81 y.o.   MRN: [id number]     Cardiology Office Note   Date:  12/03/2014   ID:  [name] [name], DOB May 06, 1924, MRN [id number]  PCP:  Mathews Argyle, MD    No chief complaint on file. f/u AFib   Wt Readings from Last 3 Encounters:  12/03/14 181 lb (82.101 kg)  11/14/14 182 lb 6 oz (82.725 kg)  09/24/14 178 lb 8 oz (80.967 kg)       History of Present Illness: [name] [name] is a 81 y.o. male  who had gallstone pancreatitis and subsequent cholecystectomy in 2013. He has a h/o PAF and HTN.  No chest pain. No palpitations. Occasional DOE. Still active in the yard. BP has been in the 397-673 range systolic. Lowest was 419 systolic.He uses CPAP. Overall, he feels more tired but is able to get done the things he needs done. Energy level is low.  He does not do much walking.  He has fallen several times.  He has a cane but does not use it all of the time.  No chest pain.  He has a balance issue.    He often skips the afternoon dose of Lasix unless he has swelling.    No palpitations.    Past Medical History  Diagnosis Date  . Hypertension   . Hearing loss   . Pacemaker   . Leg swelling 10/19/2011    LLE  . COPD (chronic obstructive pulmonary disease) ([facility]) 10/19/2011    "touch"  . Exertional dyspnea 10/19/2011  . OSA on CPAP 10/19/2011    "sometimes wear CPAP"  . Prostate cancer ([place])     S/P radiation tx ~ 2008  . History of blood transfusion     "think it was related to pacemaker placement"  . GERD (gastroesophageal reflux disease)   . Arthritis     "back"  . Chronic lower back pain     Past Surgical History  Procedure Laterality Date  . Hernia repair  ~ 2010;     lap; VHR  . Hernia repair  ~ 3790    lap vh complicated by recurrence  . Hernia repair  09/2011    open; VHR  . Insert / replace / remove pacemaker  ~ 2009    initial placement  . Cataract extraction w/ intraocular lens  implant, bilateral     . Joint replacement    . Total knee arthroplasty  1990's    right  . Cholecystectomy  10/23/2011  . Cholecystectomy  10/23/2011    Procedure: CHOLECYSTECTOMY;  Surgeon: Rolm Bookbinder, MD;  Location: [facility];  Service: General;  Laterality: N/A;  with intraoperative cholangiogram  . I&d extremity Left 10/20/2012    Procedure: LEFT LEG IRRIGATION AND DEBRIDEMENT, AND WOUND VAC APPLICATION;  Surgeon: Marianna Payment, MD;  [facility];  Service: Orthopedics;  Laterality: Left;  . I&d extremity Left 10/22/2012    Procedure: IRRIGATION AND DEBRIDEMENT EXTREMITY;  Surgeon: Marianna Payment, MD;  [facility];  Service: Orthopedics;  Laterality: Left;  . Skin split graft Left 10/22/2012    Procedure: SKIN GRAFT SPLIT THICKNESS;  Surgeon: Marianna Payment, MD;  Location[facility];  Service: Orthopedics;  Laterality: Left;  . Carpal tunnel release Left 11/14/2014    Procedure: CARPAL TUNNEL RELEASE LEFT THUMB;  Surgeon: Daryll Brod, MD;  Location: [facility];  Service: Orthopedics;  Laterality: Left;  ANESTHESIA:  IV  REGIONAL FAB     Current Outpatient Prescriptions  Medication Sig Dispense Refill  . aspirin EC 81 MG tablet Take 81 mg by mouth daily.    . Calcium 600-200 MG-UNIT per tablet Take 1 tablet by mouth daily.    . darbepoetin alfa-polysorbate (ARANESP, ALB FREE, SURECLICK) 211 MCG/ML injection Inject 500 mcg into the skin See admin instructions. Every 2 months. Last dose was couple weeks ago from today(10-07-14)    . furosemide (LASIX) 40 MG tablet Take 40 mg by mouth 2 (two) times daily.     Marland Kitchen lisinopril (PRINIVIL,ZESTRIL) 20 MG tablet Take 20 mg by mouth daily.     . Multiple Vitamins-Minerals (PRESERVISION AREDS PO) Take 1 tablet by mouth 2 (two) times daily.    . Omega-3 Fatty Acids (FISH OIL) 1200 MG CAPS Take 1 capsule by mouth daily.      Marland Kitchen omeprazole (PRILOSEC) 20 MG capsule Take 20 mg by mouth daily.     Marland Kitchen oxybutynin (DITROPAN XL) 15 MG 24 hr tablet Take 15  mg by mouth every other day.     . oxyCODONE-acetaminophen (PERCOCET/ROXICET) 5-325 MG per tablet Take 1 tablet by mouth every 6 (six) hours as needed for moderate pain or severe pain.    Marland Kitchen Umeclidinium-Vilanterol (ANORO ELLIPTA) 62.5-25 MCG/INH AEPB Inhale 1 puff into the lungs daily.     . vitamin B-12 (CYANOCOBALAMIN) 500 MCG tablet Take 500 mcg by mouth daily.      No current facility-administered medications for this visit.    Allergies:   Review of patient's allergies indicates no known allergies.    Social History:  The patient  reports that he quit smoking about 41 years ago. His smoking use included Cigarettes. He has a 52.5 pack-year smoking history. He has never used smokeless tobacco. He reports that he does not drink alcohol or use illicit drugs.   Family History:  The patient's family history includes Colon cancer in his mother; Heart disease in his father and mother; Stroke in his mother. There is no history of Heart attack or Hypertension.    ROS:  Please see the history of present illness.   Otherwise, review of systems are positive for fatigue.   All other systems are reviewed and negative.    PHYSICAL EXAM: VS:  BP 130/60 mmHg  Pulse 61  Ht 5\' 9"  (1.753 m)  Wt 181 lb (82.101 kg)  BMI 26.72 kg/m2  SpO2 98% , BMI Body mass index is 26.72 kg/(m^2). GEN: Well nourished, well developed, in no acute distress HEENT: normal Neck: no JVD, carotid bruits, or masses Cardiac: RRR; no murmurs, rubs, or gallops,no edema  Respiratory:  clear to auscultation bilaterally, normal work of breathing GI: soft, nontender, nondistended, + BS MS: no deformity or atrophy Skin: warm and dry, no rash Neuro:  Strength and sensation are intact Psych: euthymic mood, full affect   EKG:   The ekg ordered in 8/16 demonstrates atrial pacing   Recent Labs: 11/13/2014: BUN 32*; Creatinine, Ser 1.20; Potassium 4.5; Sodium 138 11/19/2014: HGB 10.8*; Platelets 145   Lipid Panel No results  found for: CHOL, TRIG, HDL, CHOLHDL, VLDL, LDLCALC, LDLDIRECT   Other studies Reviewed: Additional studies/ records that were reviewed today with results demonstrating: LDL 68 in 4/16.   ASSESSMENT AND PLAN:  1. AFib:  Not a good candidate for anticoagulation.  Had PAF several years ago.  No sx at this time.  2. Abnormal echo: inferior hypokinessis on prior echo.  No angina.  3. Bilateral edema: CONtinue furosemide.  Elevate legs at night. 4. Pacer: followed by Dr. Caryl Comes.  5. Lipids : normal with Dr. Felipa Eth.  HTN controlled.    Current medicines are reviewed at length with the patient today.  The patient concerns regarding his medicines were addressed.  The following changes have been made:  No change  Labs/ tests ordered today include:  No orders of the defined types were placed in this encounter.    Recommend 150 minutes/week of aerobic exercise, recumbent bike would be preferable to prevent falls Low fat, low carb, high fiber diet recommended  Disposition:   FU in approx 12-18 months, f/u with Dr. Caryl Comes as well   Signed, Jettie Booze., MD  12/03/2014 3:10 PM    [facility], [place], [place]  [address] Phone: [phone]; Fax: [phone]

## 2014-12-03 NOTE — Patient Instructions (Signed)
Medication Instructions:  Same-no changes  Labwork: None  Testing/Procedures: None

## 2014-12-05 ENCOUNTER — Ambulatory Visit: Payer: Medicare Other | Admitting: Pulmonary Disease

## 2014-12-16 DIAGNOSIS — M4806 Spinal stenosis, lumbar region: Secondary | ICD-10-CM | POA: Diagnosis not present

## 2014-12-23 ENCOUNTER — Other Ambulatory Visit: Payer: Self-pay | Admitting: Gastroenterology

## 2014-12-23 ENCOUNTER — Encounter: Payer: Medicare Other | Admitting: *Deleted

## 2014-12-26 DIAGNOSIS — L570 Actinic keratosis: Secondary | ICD-10-CM | POA: Diagnosis not present

## 2014-12-26 DIAGNOSIS — L821 Other seborrheic keratosis: Secondary | ICD-10-CM | POA: Diagnosis not present

## 2014-12-27 DIAGNOSIS — M5416 Radiculopathy, lumbar region: Secondary | ICD-10-CM | POA: Diagnosis not present

## 2014-12-27 DIAGNOSIS — M545 Low back pain: Secondary | ICD-10-CM | POA: Diagnosis not present

## 2014-12-27 DIAGNOSIS — M4806 Spinal stenosis, lumbar region: Secondary | ICD-10-CM | POA: Diagnosis not present

## 2015-01-01 DIAGNOSIS — M545 Low back pain, unspecified: Secondary | ICD-10-CM | POA: Insufficient documentation

## 2015-01-01 DIAGNOSIS — C61 Malignant neoplasm of prostate: Secondary | ICD-10-CM | POA: Insufficient documentation

## 2015-01-01 DIAGNOSIS — R42 Dizziness and giddiness: Secondary | ICD-10-CM | POA: Insufficient documentation

## 2015-01-01 DIAGNOSIS — I10 Essential (primary) hypertension: Secondary | ICD-10-CM | POA: Insufficient documentation

## 2015-01-02 ENCOUNTER — Encounter (HOSPITAL_COMMUNITY): Payer: Self-pay | Admitting: *Deleted

## 2015-01-02 ENCOUNTER — Other Ambulatory Visit: Payer: Medicare Other

## 2015-01-02 ENCOUNTER — Ambulatory Visit: Payer: Medicare Other

## 2015-01-13 DIAGNOSIS — Z8546 Personal history of malignant neoplasm of prostate: Secondary | ICD-10-CM | POA: Diagnosis not present

## 2015-01-13 DIAGNOSIS — N401 Enlarged prostate with lower urinary tract symptoms: Secondary | ICD-10-CM | POA: Diagnosis not present

## 2015-01-14 ENCOUNTER — Ambulatory Visit (HOSPITAL_COMMUNITY): Payer: Medicare Other | Admitting: Certified Registered Nurse Anesthetist

## 2015-01-14 ENCOUNTER — Ambulatory Visit (HOSPITAL_COMMUNITY)
Admission: RE | Admit: 2015-01-14 | Discharge: 2015-01-14 | Disposition: A | Discharge status: Home / Self Care | Payer: Medicare Other | Source: Ambulatory Visit | Attending: Gastroenterology | Admitting: Gastroenterology

## 2015-01-14 ENCOUNTER — Encounter (HOSPITAL_COMMUNITY): Payer: Self-pay

## 2015-01-14 ENCOUNTER — Encounter (HOSPITAL_COMMUNITY)
Admission: RE | Disposition: A | Discharge status: Home / Self Care | Payer: Self-pay | Source: Ambulatory Visit | Attending: Gastroenterology

## 2015-01-14 DIAGNOSIS — Z87891 Personal history of nicotine dependence: Secondary | ICD-10-CM | POA: Diagnosis not present

## 2015-01-14 DIAGNOSIS — K222 Esophageal obstruction: Secondary | ICD-10-CM | POA: Diagnosis not present

## 2015-01-14 DIAGNOSIS — M1990 Unspecified osteoarthritis, unspecified site: Secondary | ICD-10-CM | POA: Insufficient documentation

## 2015-01-14 DIAGNOSIS — K449 Diaphragmatic hernia without obstruction or gangrene: Secondary | ICD-10-CM | POA: Diagnosis not present

## 2015-01-14 DIAGNOSIS — I4891 Unspecified atrial fibrillation: Secondary | ICD-10-CM | POA: Diagnosis not present

## 2015-01-14 DIAGNOSIS — R1310 Dysphagia, unspecified: Secondary | ICD-10-CM | POA: Diagnosis not present

## 2015-01-14 DIAGNOSIS — I739 Peripheral vascular disease, unspecified: Secondary | ICD-10-CM | POA: Diagnosis not present

## 2015-01-14 DIAGNOSIS — Z96651 Presence of right artificial knee joint: Secondary | ICD-10-CM | POA: Insufficient documentation

## 2015-01-14 DIAGNOSIS — K219 Gastro-esophageal reflux disease without esophagitis: Secondary | ICD-10-CM | POA: Insufficient documentation

## 2015-01-14 DIAGNOSIS — Z950 Presence of cardiac pacemaker: Secondary | ICD-10-CM | POA: Insufficient documentation

## 2015-01-14 DIAGNOSIS — J449 Chronic obstructive pulmonary disease, unspecified: Secondary | ICD-10-CM | POA: Diagnosis not present

## 2015-01-14 DIAGNOSIS — I10 Essential (primary) hypertension: Secondary | ICD-10-CM | POA: Insufficient documentation

## 2015-01-14 HISTORY — PX: BALLOON DILATION: SHX5330

## 2015-01-14 HISTORY — PX: ESOPHAGOGASTRODUODENOSCOPY (EGD) WITH PROPOFOL: SHX5813

## 2015-01-14 SURGERY — ESOPHAGOGASTRODUODENOSCOPY (EGD) WITH PROPOFOL
Anesthesia: Monitor Anesthesia Care

## 2015-01-14 MED ORDER — ONDANSETRON HCL 4 MG/2ML IJ SOLN
INTRAMUSCULAR | Status: DC | PRN
  Administered 2015-01-14: 4 mg via INTRAVENOUS

## 2015-01-14 MED ORDER — LIDOCAINE HCL (CARDIAC) 20 MG/ML IV SOLN
INTRAVENOUS | Status: AC
  Filled 2015-01-14: qty 5

## 2015-01-14 MED ORDER — PROPOFOL 500 MG/50ML IV EMUL
INTRAVENOUS | Status: DC | PRN
  Administered 2015-01-14: 75 ug/kg/min via INTRAVENOUS

## 2015-01-14 MED ORDER — ONDANSETRON HCL 4 MG/2ML IJ SOLN
INTRAMUSCULAR | Status: AC
  Filled 2015-01-14: qty 2

## 2015-01-14 MED ORDER — PROPOFOL 10 MG/ML IV BOLUS
INTRAVENOUS | Status: AC
  Filled 2015-01-14: qty 60

## 2015-01-14 MED ORDER — PROPOFOL 10 MG/ML IV BOLUS
INTRAVENOUS | Status: DC | PRN
  Administered 2015-01-14: 20 mg via INTRAVENOUS

## 2015-01-14 MED ORDER — LIDOCAINE HCL (CARDIAC) 20 MG/ML IV SOLN
INTRAVENOUS | Status: DC | PRN
  Administered 2015-01-14: 100 mg via INTRAVENOUS

## 2015-01-14 MED ORDER — LACTATED RINGERS IV SOLN
INTRAVENOUS | Status: DC | PRN
  Administered 2015-01-14: 10:00:00 via INTRAVENOUS

## 2015-01-14 SURGICAL SUPPLY — 15 items

## 2015-01-14 NOTE — Anesthesia Preprocedure Evaluation (Signed)
Anesthesia Evaluation  Patient identified by MRN, date of birth, ID band Patient awake    Reviewed: Allergy & Precautions, NPO status , Patient's Chart, lab work & pertinent test results  Airway Mallampati: II  TM Distance: >3 FB Neck ROM: Full    Dental no notable dental hx.    Pulmonary shortness of breath, sleep apnea , COPD, former smoker,    Pulmonary exam normal breath sounds clear to auscultation       Cardiovascular hypertension, Pt. on medications + Peripheral Vascular Disease and + DOE  Normal cardiovascular exam+ pacemaker  Rhythm:Regular Rate:Normal     Neuro/Psych negative neurological ROS  negative psych ROS   GI/Hepatic Neg liver ROS, GERD  ,  Endo/Other  negative endocrine ROS  Renal/GU negative Renal ROS  negative genitourinary   Musculoskeletal negative musculoskeletal ROS (+) Arthritis ,   Abdominal   Peds negative pediatric ROS (+)  Hematology negative hematology ROS (+) anemia ,   Anesthesia Other Findings   Reproductive/Obstetrics negative OB ROS                             Anesthesia Physical Anesthesia Plan  ASA: III  Anesthesia Plan: MAC   Post-op Pain Management:    Induction: Intravenous  Airway Management Planned:   Additional Equipment:   Intra-op Plan:   Post-operative Plan: Extubation in OR  Informed Consent: I have reviewed the patients History and Physical, chart, labs and discussed the procedure including the risks, benefits and alternatives for the proposed anesthesia with the patient or authorized representative who has indicated his/her understanding and acceptance.   Dental advisory given  Plan Discussed with: CRNA  Anesthesia Plan Comments:         Anesthesia Quick Evaluation

## 2015-01-14 NOTE — Transfer of Care (Signed)
Immediate Anesthesia Transfer of Care Note  Patient: [name] [name]  Procedure(s) Performed: Procedure(s): ESOPHAGOGASTRODUODENOSCOPY (EGD) WITH PROPOFOL (N/A) BALLOON DILATION (N/A)  Patient Location: ENDO  Anesthesia Type:MAC  Level of Consciousness:  sedated, patient cooperative and responds to stimulation  Airway & Oxygen Therapy:Patient Spontanous Breathing and Patient connected to face mask oxgen  Post-op Assessment:  Report given to ENDO RN and Post -op Vital signs reviewed and stable  Post vital signs:  Reviewed and stable  Last Vitals:  Filed Vitals:   01/14/15 0838  BP: 160/84  Pulse: 65  Temp: 36.8 C  Resp: 17    Complications: No apparent anesthesia complications

## 2015-01-14 NOTE — Anesthesia Postprocedure Evaluation (Signed)
Anesthesia Post Note  Patient: [name] [name]  Procedure(s) Performed: Procedure(s) (LRB): ESOPHAGOGASTRODUODENOSCOPY (EGD) WITH PROPOFOL (N/A) BALLOON DILATION (N/A)  Patient location during evaluation: PACU Anesthesia Type: MAC Level of consciousness: awake and alert Pain management: pain level controlled Vital Signs Assessment: post-procedure vital signs reviewed and stable Respiratory status: spontaneous breathing, nonlabored ventilation, respiratory function stable and patient connected to nasal cannula oxygen Cardiovascular status: stable and blood pressure returned to baseline Anesthetic complications: no    Last Vitals:  Filed Vitals:   01/14/15 1110 01/14/15 1120  BP: 165/65 160/53  Pulse: 59 60  Temp:    Resp: 12 12    Last Pain:  Filed Vitals:   01/14/15 1206  PainSc: 0-No pain                 Ramiah Helfrich J

## 2015-01-14 NOTE — H&P (Signed)
  Problem: Benign distal esophageal stricture causing dysphagia. 01/06/2012 esophagogastroduodenoscopy with benign distal esophageal stricture dilation performed  History: The patient is an 81 year old male born January 06, 1925. He has a benign stricture at the esophagogastric junction which required dilation in November 2013. He has redeveloped esophageal dysphagia.  The patient is scheduled to undergo repeat esophagogastroduodenoscopy with benign esophageal stricture dilation.  Past medical history: Benign stricture at the esophagogastric junction. Moderate COPD. Hypertension. Lumbar spinal stenosis. Mild mitral valve regurgitation. Prostate cancer. Pacemaker placed for sinus node dysfunction. Small left frontal meningioma. Peripheral neuropathy. Gallstone pancreatitis diagnosed in September 2013. Atrial fibrillation. Open cholecystectomy performed in September 2013. Herniorrhaphies. Right knee replacement surgery. Hydrocele repair.  Medication allergies: None  Exam: The patient is alert and lying comfortably on the endoscopy stretcher. Abdomen is soft and nontender to palpation. Lungs are clear to auscultation. Cardiac exam reveals a regular rhythm.  Plan: Proceed with diagnostic esophagogastroduodenoscopy with benign distal esophageal stricture dilation

## 2015-01-14 NOTE — Op Note (Signed)
Problem: Benign stricture at the esophagogastric junction. Esophageal dysphagia. Hiatal hernia. 01/06/2012 esophagogastroduodenoscopy performed with benign distal esophageal stricture dilation  Endoscopist: Earle Gell  Premedication: Propofol administered by anesthesia  Procedure: Diagnostic esophagogastroduodenoscopy The patient was placed in the left lateral decubitus position. The Pentax gastroscope was passed through the posterior hypopharynx into the proximal esophagus without difficulty. The hypopharynx, larynx, and vocal cords appeared normal.  Esophagoscopy: The proximal and mid segments of the esophageal mucosa appeared normal. The squamocolumnar junction was noted at 35 cm from the incisor teeth. At the esophagogastric junction was a non-obstructing stricture extending less than 1 cm in length. There was no endoscopic evidence for the presence of erosive esophagitis or Barrett's esophagus. I was easily able to traverse the stricture and entered the hiatal hernia. Using the esophageal balloon dilator, the balloon was inflated from 15 mm to 18 mm without esophageal mucosal disruption. I suspect the patient's esophageal dysphagia is due to esophageal dysmotility and not esophageal stricture obstruction to the esophagus.  Gastroscopy: There was a large hiatal hernia. Retroflexed view of the gastric cardia and fundus was normal. There were no erosions at the diaphragmatic hiatus. The gastric body, antrum, and pylorus appeared normal.  Duodenoscopy: The duodenal bulb and descending duodenum appeared normal.  Assessment: Chronic gastroesophageal reflux associated with a large hiatal hernia and complicated by a non-obstructing benign stricture at the esophagogastric junction. Inflammation of the esophageal balloon dilator from 15 mm to 18 mm did not disrupt the mucosa.  Recommendation: Continue proton pump inhibitor therapy

## 2015-01-14 NOTE — Discharge Instructions (Signed)
Esophageal Dilatation °Esophageal dilatation is a procedure to open a blocked or narrowed part of the esophagus. The esophagus is the long tube in your throat that carries food and liquid from your mouth to your stomach. The procedure is also called esophageal dilation.  °You may need this procedure if you have a buildup of scar tissue in your esophagus that makes it difficult, painful, or even impossible to swallow. This can be caused by gastroesophageal reflux disease (GERD). In rare cases, people need this procedure because they have cancer of the esophagus or a problem with the way food moves through the esophagus. Sometimes you may need to have another dilatation to enlarge the opening of the esophagus gradually. °LET YOUR HEALTH CARE PROVIDER KNOW ABOUT:  °· Any allergies you have. °· All medicines you are taking, including vitamins, herbs, eye drops, creams, and over-the-counter medicines. °· Previous problems you or members of your family have had with the use of anesthetics. °· Any blood disorders you have. °· Previous surgeries you have had. °· Medical conditions you have. °· Any antibiotic medicines you are required to take before dental procedures. °RISKS AND COMPLICATIONS °Generally, this is a safe procedure. However, problems can occur and include: °· Bleeding from a tear in the lining of the esophagus. °· A hole (perforation) in the esophagus. °BEFORE THE PROCEDURE °· Do not eat or drink anything after midnight on the night before the procedure or as directed by your health care provider. °· Ask your health care provider about changing or stopping your regular medicines. This is especially important if you are taking diabetes medicines or blood thinners. °· Plan to have someone take you home after the procedure. °PROCEDURE  °· You will be given a medicine that makes you relaxed and sleepy (sedative). °· A medicine may be sprayed or gargled to numb the back of the throat. °· Your health care provider  can use various instruments to do an esophageal dilatation. During the procedure, the instrument used will be placed in your mouth and passed down into your esophagus. Options include: °¨ Simple dilators. This instrument is carefully placed in the esophagus to stretch it. °¨ Guided wire bougies. In this method, a flexible tube (endoscope) is used to insert a wire into the esophagus. The dilator is passed over this wire to enlarge the esophagus. Then the wire is removed. °¨ Balloon dilators. An endoscope with a small balloon at the end is passed down into the esophagus. Inflating the balloon gently stretches the esophagus and opens it up. °AFTER THE PROCEDURE °· Your blood pressure, heart rate, breathing rate, and blood oxygen level will be monitored often until the medicines you were given have worn off. °· Your throat may feel slightly sore and will probably still feel numb. This will improve slowly over time. °· You will not be allowed to eat or drink until the throat numbness has resolved. °· If this is a same-day procedure, you may be allowed to go home once you have been able to drink, urinate, and sit on the edge of the bed without nausea or dizziness. °· If this is a same-day procedure, you should have a friend or family member with you for the next 24 hours after the procedure. °  °This information is not intended to replace advice given to you by your health care provider. Make sure you discuss any questions you have with your health care provider. °  °Document Released: 03/25/2005 Document Revised: 02/22/2014 Document Reviewed: 06/13/2013 °Elsevier   Interactive Patient Education ©2016 [employer] °Esophagogastroduodenoscopy, Care After °Refer to this sheet in the next few weeks. These instructions provide you with information about caring for yourself after your procedure. Your health care provider may also give you more specific instructions. Your treatment has been planned according to current medical  practices, but problems sometimes occur. Call your health care provider if you have any problems or questions after your procedure. °WHAT TO EXPECT AFTER THE PROCEDURE °After your procedure, it is typical to feel: °· Soreness in your throat. °· Pain with swallowing. °· Sick to your stomach (nauseous). °· Bloated. °· Dizzy. °· Fatigued. °HOME CARE INSTRUCTIONS °· Do not eat or drink anything until the numbing medicine (local anesthetic) has worn off and your gag reflex has returned. You will know that the local anesthetic has worn off when you can swallow comfortably. °· Do not drive or operate machinery until directed by your health care provider. °· Take medicines only as directed by your health care provider. °SEEK MEDICAL CARE IF:  °· You cannot stop coughing. °· You are not urinating at all or less than usual. °SEEK IMMEDIATE MEDICAL CARE IF: °· You have difficulty swallowing. °· You cannot eat or drink. °· You have worsening throat or chest pain. °· You have dizziness or lightheadedness or you faint. °· You have nausea or vomiting. °· You have chills. °· You have a fever. °· You have severe abdominal pain. °· You have black, tarry, or bloody stools. °  °This information is not intended to replace advice given to you by your health care provider. Make sure you discuss any questions you have with your health care provider. °  °Document Released: 01/19/2012 Document Revised: 02/22/2014 Document Reviewed: 01/19/2012 °Elsevier Interactive Patient Education ©2016 [employer] Inc. ° °

## 2015-01-15 ENCOUNTER — Encounter (HOSPITAL_COMMUNITY): Payer: Self-pay | Admitting: Gastroenterology

## 2015-01-20 DIAGNOSIS — N138 Other obstructive and reflux uropathy: Secondary | ICD-10-CM | POA: Diagnosis not present

## 2015-01-20 DIAGNOSIS — N401 Enlarged prostate with lower urinary tract symptoms: Secondary | ICD-10-CM | POA: Diagnosis not present

## 2015-01-20 DIAGNOSIS — Z8546 Personal history of malignant neoplasm of prostate: Secondary | ICD-10-CM | POA: Diagnosis not present

## 2015-01-21 ENCOUNTER — Ambulatory Visit (HOSPITAL_BASED_OUTPATIENT_CLINIC_OR_DEPARTMENT_OTHER): Payer: Medicare Other

## 2015-01-21 ENCOUNTER — Other Ambulatory Visit (HOSPITAL_BASED_OUTPATIENT_CLINIC_OR_DEPARTMENT_OTHER): Payer: Medicare Other

## 2015-01-21 VITALS — BP 144/73 | HR 86 | Temp 98.6°F

## 2015-01-21 DIAGNOSIS — N189 Chronic kidney disease, unspecified: Principal | ICD-10-CM

## 2015-01-21 DIAGNOSIS — J449 Chronic obstructive pulmonary disease, unspecified: Secondary | ICD-10-CM

## 2015-01-21 DIAGNOSIS — D631 Anemia in chronic kidney disease: Secondary | ICD-10-CM

## 2015-01-21 DIAGNOSIS — D649 Anemia, unspecified: Secondary | ICD-10-CM

## 2015-01-21 DIAGNOSIS — N289 Disorder of kidney and ureter, unspecified: Secondary | ICD-10-CM

## 2015-01-21 DIAGNOSIS — K432 Incisional hernia without obstruction or gangrene: Secondary | ICD-10-CM

## 2015-01-21 LAB — CBC WITH DIFFERENTIAL/PLATELET
BASO%: 0.5 % (ref 0.0–2.0)
Basophils Absolute: 0 10*3/uL (ref 0.0–0.1)
EOS%: 2 % (ref 0.0–7.0)
Eosinophils Absolute: 0.1 10*3/uL (ref 0.0–0.5)
HCT: 32.9 % — ABNORMAL LOW (ref 38.4–49.9)
HGB: 10.7 g/dL — ABNORMAL LOW (ref 13.0–17.1)
LYMPH%: 15.7 % (ref 14.0–49.0)
MCH: 30.5 pg (ref 27.2–33.4)
MCHC: 32.5 g/dL (ref 32.0–36.0)
MCV: 93.9 fL (ref 79.3–98.0)
MONO#: 0.4 10*3/uL (ref 0.1–0.9)
MONO%: 6 % (ref 0.0–14.0)
NEUT#: 5.5 10*3/uL (ref 1.5–6.5)
NEUT%: 75.8 % — ABNORMAL HIGH (ref 39.0–75.0)
Platelets: 127 10*3/uL — ABNORMAL LOW (ref 140–400)
RBC: 3.5 10*6/uL — ABNORMAL LOW (ref 4.20–5.82)
RDW: 14.7 % — ABNORMAL HIGH (ref 11.0–14.6)
WBC: 7.2 10*3/uL (ref 4.0–10.3)
lymph#: 1.1 10*3/uL (ref 0.9–3.3)

## 2015-01-21 MED ORDER — DARBEPOETIN ALFA 300 MCG/0.6ML IJ SOSY
300.0000 ug | PREFILLED_SYRINGE | Freq: Once | INTRAMUSCULAR | Status: AC
  Administered 2015-01-21: 300 ug via SUBCUTANEOUS
  Filled 2015-01-21: qty 0.6

## 2015-01-27 DIAGNOSIS — M4316 Spondylolisthesis, lumbar region: Secondary | ICD-10-CM | POA: Diagnosis not present

## 2015-01-27 DIAGNOSIS — Z79891 Long term (current) use of opiate analgesic: Secondary | ICD-10-CM | POA: Diagnosis not present

## 2015-01-27 DIAGNOSIS — M4806 Spinal stenosis, lumbar region: Secondary | ICD-10-CM | POA: Diagnosis not present

## 2015-01-27 DIAGNOSIS — G894 Chronic pain syndrome: Secondary | ICD-10-CM | POA: Diagnosis not present

## 2015-02-21 DIAGNOSIS — J3489 Other specified disorders of nose and nasal sinuses: Secondary | ICD-10-CM | POA: Diagnosis not present

## 2015-02-21 DIAGNOSIS — D692 Other nonthrombocytopenic purpura: Secondary | ICD-10-CM | POA: Diagnosis not present

## 2015-02-21 DIAGNOSIS — D649 Anemia, unspecified: Secondary | ICD-10-CM | POA: Diagnosis not present

## 2015-02-21 DIAGNOSIS — I10 Essential (primary) hypertension: Secondary | ICD-10-CM | POA: Diagnosis not present

## 2015-02-27 ENCOUNTER — Ambulatory Visit: Payer: Medicare Other

## 2015-02-27 ENCOUNTER — Other Ambulatory Visit: Payer: Medicare Other

## 2015-03-07 ENCOUNTER — Ambulatory Visit: Payer: Medicare Other | Admitting: Pulmonary Disease

## 2015-03-12 ENCOUNTER — Encounter (HOSPITAL_COMMUNITY): Payer: Self-pay | Admitting: Emergency Medicine

## 2015-03-12 ENCOUNTER — Emergency Department (HOSPITAL_COMMUNITY): Payer: Worker's Compensation

## 2015-03-12 ENCOUNTER — Emergency Department (HOSPITAL_COMMUNITY)
Admission: EM | Admit: 2015-03-12 | Discharge: 2015-03-12 | Disposition: A | Discharge status: Home / Self Care | Payer: Worker's Compensation | Attending: Emergency Medicine | Admitting: Emergency Medicine

## 2015-03-12 DIAGNOSIS — I10 Essential (primary) hypertension: Secondary | ICD-10-CM | POA: Diagnosis not present

## 2015-03-12 DIAGNOSIS — Y9389 Activity, other specified: Secondary | ICD-10-CM | POA: Insufficient documentation

## 2015-03-12 DIAGNOSIS — G4733 Obstructive sleep apnea (adult) (pediatric): Secondary | ICD-10-CM | POA: Diagnosis not present

## 2015-03-12 DIAGNOSIS — W01198A Fall on same level from slipping, tripping and stumbling with subsequent striking against other object, initial encounter: Secondary | ICD-10-CM | POA: Insufficient documentation

## 2015-03-12 DIAGNOSIS — S42432A Displaced fracture (avulsion) of lateral epicondyle of left humerus, initial encounter for closed fracture: Secondary | ICD-10-CM | POA: Diagnosis not present

## 2015-03-12 DIAGNOSIS — Z7982 Long term (current) use of aspirin: Secondary | ICD-10-CM | POA: Insufficient documentation

## 2015-03-12 DIAGNOSIS — M7989 Other specified soft tissue disorders: Secondary | ICD-10-CM | POA: Diagnosis not present

## 2015-03-12 DIAGNOSIS — Y998 Other external cause status: Secondary | ICD-10-CM | POA: Insufficient documentation

## 2015-03-12 DIAGNOSIS — Z8546 Personal history of malignant neoplasm of prostate: Secondary | ICD-10-CM | POA: Diagnosis not present

## 2015-03-12 DIAGNOSIS — K219 Gastro-esophageal reflux disease without esophagitis: Secondary | ICD-10-CM | POA: Diagnosis not present

## 2015-03-12 DIAGNOSIS — Z87891 Personal history of nicotine dependence: Secondary | ICD-10-CM | POA: Insufficient documentation

## 2015-03-12 DIAGNOSIS — M1990 Unspecified osteoarthritis, unspecified site: Secondary | ICD-10-CM | POA: Diagnosis not present

## 2015-03-12 DIAGNOSIS — J449 Chronic obstructive pulmonary disease, unspecified: Secondary | ICD-10-CM | POA: Insufficient documentation

## 2015-03-12 DIAGNOSIS — S51012A Laceration without foreign body of left elbow, initial encounter: Secondary | ICD-10-CM | POA: Insufficient documentation

## 2015-03-12 DIAGNOSIS — G8929 Other chronic pain: Secondary | ICD-10-CM | POA: Insufficient documentation

## 2015-03-12 DIAGNOSIS — S3992XA Unspecified injury of lower back, initial encounter: Secondary | ICD-10-CM | POA: Diagnosis not present

## 2015-03-12 DIAGNOSIS — Z9981 Dependence on supplemental oxygen: Secondary | ICD-10-CM | POA: Insufficient documentation

## 2015-03-12 DIAGNOSIS — Y9289 Other specified places as the place of occurrence of the external cause: Secondary | ICD-10-CM | POA: Diagnosis not present

## 2015-03-12 DIAGNOSIS — Z862 Personal history of diseases of the blood and blood-forming organs and certain disorders involving the immune mechanism: Secondary | ICD-10-CM | POA: Insufficient documentation

## 2015-03-12 DIAGNOSIS — S42472A Displaced transcondylar fracture of left humerus, initial encounter for closed fracture: Secondary | ICD-10-CM | POA: Diagnosis not present

## 2015-03-12 DIAGNOSIS — S79912A Unspecified injury of left hip, initial encounter: Secondary | ICD-10-CM | POA: Diagnosis not present

## 2015-03-12 DIAGNOSIS — S4992XA Unspecified injury of left shoulder and upper arm, initial encounter: Secondary | ICD-10-CM | POA: Insufficient documentation

## 2015-03-12 DIAGNOSIS — M25512 Pain in left shoulder: Secondary | ICD-10-CM | POA: Diagnosis not present

## 2015-03-12 DIAGNOSIS — Z79899 Other long term (current) drug therapy: Secondary | ICD-10-CM | POA: Insufficient documentation

## 2015-03-12 DIAGNOSIS — S59902A Unspecified injury of left elbow, initial encounter: Secondary | ICD-10-CM | POA: Diagnosis present

## 2015-03-12 DIAGNOSIS — M79604 Pain in right leg: Secondary | ICD-10-CM | POA: Diagnosis not present

## 2015-03-12 DIAGNOSIS — M25552 Pain in left hip: Secondary | ICD-10-CM | POA: Diagnosis not present

## 2015-03-12 DIAGNOSIS — M545 Low back pain: Secondary | ICD-10-CM | POA: Diagnosis not present

## 2015-03-12 DIAGNOSIS — S8991XA Unspecified injury of right lower leg, initial encounter: Secondary | ICD-10-CM | POA: Diagnosis not present

## 2015-03-12 NOTE — ED Notes (Signed)
PT instructed to follow up with ortho and read over discharge instructions. PT given information on signs of decreased perfusion and is instructed to return to ED if decreased perfusion is suspected. PT has no questions at discharge

## 2015-03-12 NOTE — ED Provider Notes (Signed)
CSN: [id number]     Arrival date & time 03/12/15  1158 History   First MD Initiated Contact with Patient 03/12/15 1636     Chief Complaint  Patient presents with  . Fall  . Elbow Pain     HPI Patient presents to the emergency room for evaluation after a fall. Patient was walking and was either hit by a garage door or jumped out of the way when it started to come down.Marland Kitchen He then fell landing on his left side. Patient landed with most of his weight on his left elbow but also landed on his lower back and buttock. He now has having pain primarily in the left elbow. He noticed a lot of bruising and swelling. He has pain with movement of that left elbow. Also had some pain in his left shoulder but as time has gone on he's noted some discomfort in his lower back and his right lower leg. He denies Any loss of consciousness. He denies taking any blood thinning agents. He denies any trouble with any chest pain numbness or weakness. He has been able to walk since the injury. Past Medical History  Diagnosis Date  . Hypertension   . Hearing loss   . Pacemaker   . Leg swelling 10/19/2011    LLE  . COPD (chronic obstructive pulmonary disease) ([place]) 10/19/2011    "touch"  . Exertional dyspnea 10/19/2011  . OSA on CPAP 10/19/2011    "sometimes wear CPAP"  . Prostate cancer ([place])     S/P radiation tx ~ 2008  . History of blood transfusion     "think it was related to pacemaker placement"  . GERD (gastroesophageal reflux disease)   . Arthritis     "back"  . Chronic lower back pain   . Skin change     12-24-14 few scalp lesions burned off"old age spots"  . Anemia     occ. injections for this at [facility]   Past Surgical History  Procedure Laterality Date  . Hernia repair  ~ 2010;     lap; VHR  . Hernia repair  ~ [id number]    lap vh complicated by recurrence  . Hernia repair  09/2011    open; [facility]  . Insert / replace / remove pacemaker  ~ 2009    initial placement  . Cataract extraction w/ intraocular  lens  implant, bilateral    . Joint replacement    . Total knee arthroplasty  1990's    right  . Cholecystectomy  10/23/2011  . Cholecystectomy  10/23/2011    Procedure: CHOLECYSTECTOMY;  Surgeon: Rolm Bookbinder, MD;  Location: [facility];  Service: General;  Laterality: N/A;  with intraoperative cholangiogram  . I&d extremity Left 10/20/2012    Procedure: LEFT LEG IRRIGATION AND DEBRIDEMENT, AND WOUND VAC APPLICATION;  Surgeon: Marianna Payment, MD;  Location: [facility];  Service: Orthopedics;  Laterality: Left;  . I&d extremity Left 10/22/2012    Procedure: IRRIGATION AND DEBRIDEMENT EXTREMITY;  Surgeon: Marianna Payment, MD;  Location: [facility];  Service: Orthopedics;  Laterality: Left;  . Skin split graft Left 10/22/2012    Procedure: SKIN GRAFT SPLIT THICKNESS;  Surgeon: Marianna Payment, MD;  Location: [facility];  Service: Orthopedics;  Laterality: Left;  . Carpal tunnel release Left 11/14/2014    Procedure: CARPAL TUNNEL RELEASE LEFT THUMB;  Surgeon: Daryll Brod, MD;  Location: [facility];  Service: Orthopedics;  Laterality: Left;  ANESTHESIA:  IV REGIONAL FAB  .  Esophagogastroduodenoscopy (egd) with propofol N/A 01/14/2015    Procedure: ESOPHAGOGASTRODUODENOSCOPY (EGD) WITH PROPOFOL;  Surgeon: Garlan Fair, MD;  Location: [facility];  Service: Endoscopy;  Laterality: N/A;  . Balloon dilation N/A 01/14/2015    Procedure: BALLOON DILATION;  Surgeon: Garlan Fair, MD;  Location: [facility];  Service: Endoscopy;  Laterality: N/A;   Family History  Problem Relation Age of Onset  . Heart disease Father   . Heart disease Mother   . Colon cancer Mother   . Heart attack Neg Hx   . Hypertension Neg Hx   . Stroke Mother    Social History  Substance Use Topics  . Smoking status: Former Smoker -- 1.50 packs/day for 35 years    Types: Cigarettes    Quit date: 02/15/1973  . Smokeless tobacco: Never Used  . Alcohol Use: No    Review of Systems  All other systems reviewed and  are negative.     Allergies  Review of patient's allergies indicates no known allergies.  Home Medications   Prior to Admission medications   Medication Sig Start Date End Date Taking? Authorizing Provider  aspirin EC 81 MG tablet Take 81 mg by mouth daily.   Yes Historical Provider, MD  Calcium 600-200 MG-UNIT per tablet Take 1 tablet by mouth daily.   Yes Historical Provider, MD  darbepoetin alfa-polysorbate (ARANESP, ALB FREE, SURECLICK) [id number] MCG/ML injection Inject 500 mcg into the skin See admin instructions. Every 2 months   Yes Historical Provider, MD  furosemide (LASIX) 40 MG tablet Take 40-80 mg by mouth See admin instructions. Take 40mg  daily.  May take an additional 40mg  for leg swelling   Yes Historical Provider, MD  lisinopril (PRINIVIL,ZESTRIL) 20 MG tablet Take 10 mg by mouth daily.    Yes Historical Provider, MD  Multiple Vitamins-Minerals (PRESERVISION AREDS PO) Take 1 tablet by mouth 2 (two) times daily.   Yes Historical Provider, MD  Omega-3 Fatty Acids (FISH OIL) 1200 MG CAPS Take 1 capsule by mouth daily.     Yes Historical Provider, MD  omeprazole (PRILOSEC) 20 MG capsule Take 20 mg by mouth daily.    Yes Historical Provider, MD  oxybutynin (DITROPAN XL) 15 MG 24 hr tablet Take 15 mg by mouth every other day.    Yes Historical Provider, MD  oxyCODONE-acetaminophen (PERCOCET/ROXICET) 5-325 MG per tablet Take 1 tablet by mouth every 6 (six) hours as needed for moderate pain or severe pain.   Yes Historical Provider, MD  Umeclidinium-Vilanterol (ANORO ELLIPTA) 62.5-25 MCG/INH AEPB Inhale 1 puff into the lungs daily.    Yes Historical Provider, MD  vitamin B-12 (CYANOCOBALAMIN) 500 MCG tablet Take 500 mcg by mouth daily.    Yes Historical Provider, MD   BP 174/73 mmHg  Pulse 62  Temp(Src) 98.3 F (36.8 C)  Resp 16  Ht 5\' 10"  (1.778 m)  Wt 81.647 kg  BMI 25.83 kg/m2  SpO2 99% Physical Exam  Constitutional: He appears well-developed and well-nourished. No distress.   HENT:  Head: Normocephalic and atraumatic.  Right Ear: External ear normal.  Left Ear: External ear normal.  Eyes: Conjunctivae are normal. Right eye exhibits no discharge. Left eye exhibits no discharge. No scleral icterus.  Neck: Neck supple. No tracheal deviation present.  Cardiovascular: Normal rate.   Pulmonary/Chest: Effort normal. No stridor. No respiratory distress.  Musculoskeletal: He exhibits no edema.       Left elbow: He exhibits decreased range of motion, swelling and effusion.  Right lower leg: He exhibits tenderness and swelling.  Superficial laceration less than 1 cm the olecranon process, or Jamal ecchymoses around the left elbow, edema with decreased range of motion; bruising noted around the midportion of the right lower leg below the knee no gross deformity; mild tenderness to palpation in the lower lumbar sacral area, no deformity no ecchymoses  Neurological: He is alert. Cranial nerve deficit: no gross deficits.  Alert and oriented, equal strength bilateral lower extremities and bilateral upper extremities, no facial droop normal sensation throughout,   Skin: Skin is warm and dry. No rash noted.  Psychiatric: He has a normal mood and affect.  Nursing note and vitals reviewed.   ED Course  Procedures (including critical care time) Labs Review Labs Reviewed - No data to display  Imaging Review Dg Lumbar Spine Complete  03/12/2015  CLINICAL DATA:  Pain following fall EXAM: LUMBAR SPINE - COMPLETE 4+ VIEW COMPARISON:  Lumbar spine CT March 29, 2014 FINDINGS: Frontal, lateral, spot lumbosacral lateral, and bilateral oblique views were obtained. The there are 5 non-rib-bearing lumbar type vertebral bodies. There is thoracolumbar dextroscoliosis with rotatory component. There is no acute fracture or spondylolisthesis. There is remodeling of the T12 and L1 vertebral bodies due to chronic scoliosis, stable. There is moderate disc narrowing at all levels with somewhat  more severe disc space narrowing at T12-L1, L1-2, and L2-3. There are anterior osteophytes at all levels. There is facet osteoarthritic change at all levels bilaterally. There is atherosclerotic calcification in the aorta. IMPRESSION: Scoliosis with multilevel arthropathy. No acute fracture or spondylolisthesis. Atherosclerotic calcification in aorta. There is not appear to be significant interval change compared to prior CT. Electronically Signed   By: Lowella Grip III M.D.   On: 03/12/2015 17:46   Dg Elbow Complete Left  03/12/2015  CLINICAL DATA:  Status post fall.  Left elbow pain. EXAM: LEFT ELBOW - COMPLETE 3+ VIEW COMPARISON:  None. FINDINGS: There is a mildly displaced transcondylar left elbow fracture with mild lateral displacement measuring 9 mm. There appears to be intra-articular extension of the fracture with a vertically oriented component. Soft tissues are unremarkable. IMPRESSION: Mildly displaced transcondylar left elbow fracture with mild lateral displacement measuring 9 mm. There appears to be intra-articular extension of the fracture with a vertically oriented component. Electronically Signed   By: Kathreen Devoid   On: 03/12/2015 13:13   Dg Tibia/fibula Right  03/12/2015  CLINICAL DATA:  Pain following fall EXAM: RIGHT TIBIA AND FIBULA - 2 VIEW COMPARISON:  Right knee September 02, 2008 FINDINGS: Frontal and lateral views were obtained. The patient is status post total knee replacement with femoral and tibial prosthetic components appearing well-seated. There is a small joint effusion. There is bony overgrowth along the superior patella, stable. There is no acute fracture or dislocation. There is osteoarthritic change in the ankle joint. There are scattered small phleboliths. There is also arterial vascular calcification. IMPRESSION: Total knee replacement with prosthetic components in this region well-seated. Stable bony overgrowth along the superior patella. No acute fracture or  dislocation. Mild osteoarthritic change in the ankle joint. Arterial vascular calcification noted. Electronically Signed   By: Lowella Grip III M.D.   On: 03/12/2015 17:41   Dg Shoulder Left  03/12/2015  CLINICAL DATA:  Left shoulder pain following a fall today. EXAM: LEFT SHOULDER - 2+ VIEW COMPARISON:  Left shoulder CT arthrogram dated 10/10/2007. FINDINGS: There is superior migration of the humeral head with glenohumeral joint space narrowing and inferior spur  formation. No fracture or dislocation is seen. A left subclavian dual lead pacemaker is noted. IMPRESSION: 1. No fracture or dislocation. 2. Glenohumeral joint degenerative changes. 3. Superior migration of the humeral head compatible with a chronic rotator cuff tear. Electronically Signed   By: Claudie Revering M.D.   On: 03/12/2015 13:16   Dg Hip Unilat With Pelvis 2-3 Views Left  03/12/2015  CLINICAL DATA:  Pain following fall EXAM: DG HIP (WITH OR WITHOUT PELVIS) 2-3V LEFT COMPARISON:  None. FINDINGS: Frontal pelvis as well as frontal and lateral left hip images were obtained of. There is no demonstrable acute fracture or dislocation. There is mild symmetric narrowing of both hip joints. No erosive change. Postoperative change is noted in the lower pelvis. IMPRESSION: Mild symmetric narrowing both hip joints. No fracture or dislocation. Electronically Signed   By: Lowella Grip III M.D.   On: 03/12/2015 17:48   I have personally reviewed and evaluated these images and lab results as part of my medical decision-making.    MDM   Final diagnoses:  Fracture of lateral epicondyle of humerus, left, closed, initial encounter    Patient has no other fractures of than the lateral epicondyle of the humerus.  The patient was placed in a splint and sling. He sees Dr. Sharol Given as an outpatient. Discharge home to follow-up as an outpatient. Patient states he has pain medication available at home.    Dorie Rank, MD 03/12/15 [phone]

## 2015-03-12 NOTE — Discharge Instructions (Signed)
Cast or Splint Care °Casts and splints support injured limbs and keep bones from moving while they heal.  °HOME CARE °· Keep the cast or splint uncovered during the drying period. °¨ A plaster cast can take 24 to 48 hours to dry. °¨ A fiberglass cast will dry in less than 1 hour. °· Do not rest the cast on anything harder than a pillow for 24 hours. °· Do not put weight on your injured limb. Do not put pressure on the cast. Wait for your doctor's approval. °· Keep the cast or splint dry. °¨ Cover the cast or splint with a plastic bag during baths or wet weather. °¨ If you have a cast over your chest and belly (trunk), take sponge baths until the cast is taken off. °¨ If your cast gets wet, dry it with a towel or blow dryer. Use the cool setting on the blow dryer. °· Keep your cast or splint clean. Wash a dirty cast with a damp cloth. °· Do not put any objects under your cast or splint. °· Do not scratch the skin under the cast with an object. If itching is a problem, use a blow dryer on a cool setting over the itchy area. °· Do not trim or cut your cast. °· Do not take out the padding from inside your cast. °· Exercise your joints near the cast as told by your doctor. °· Raise (elevate) your injured limb on 1 or 2 pillows for the first 1 to 3 days. °GET HELP IF: °· Your cast or splint cracks. °· Your cast or splint is too tight or too loose. °· You itch badly under the cast. °· Your cast gets wet or has a soft spot. °· You have a bad smell coming from the cast. °· You get an object stuck under the cast. °· Your skin around the cast becomes red or sore. °· You have new or more pain after the cast is put on. °GET HELP RIGHT AWAY IF: °· You have fluid leaking through the cast. °· You cannot move your fingers or toes. °· Your fingers or toes turn blue or white or are cool, painful, or puffy (swollen). °· You have tingling or lose feeling (numbness) around the injured area. °· You have bad pain or pressure under the  cast. °· You have trouble breathing or have shortness of breath. °· You have chest pain. °  °This information is not intended to replace advice given to you by your health care provider. Make sure you discuss any questions you have with your health care provider. °  °Document Released: 06/03/2010 Document Revised: 10/04/2012 Document Reviewed: 08/10/2012 °Elsevier Interactive Patient Education ©2016 [employer] Inc. ° °

## 2015-03-12 NOTE — ED Notes (Signed)
Spoke with Dr. Alvino Chapel about xray. Reports can place sling for comfort.

## 2015-03-12 NOTE — ED Notes (Signed)
Pt from home for eval of fall today, pt states was hit by a door in the head and fell on left side. Bruising and swelling noted to left elbow with limited ROM due to pain. Pt denies any LOC, denies taking any blood thinners.

## 2015-03-12 NOTE — ED Notes (Signed)
PT reports no pain as long as he keeps his left arm still. PT reports significant pain when moving that arm. PT reports he does not want pain medicine at this time

## 2015-03-12 NOTE — Progress Notes (Signed)
Orthopedic Tech Progress Note Patient Details:  [name] [name] 1924-04-02 [id number]  Ortho Devices Type of Ortho Device: Ace wrap, Arm sling, Post (long arm) splint Ortho Device/Splint Location: LUE Ortho Device/Splint Interventions: Ordered, Application   Braulio Bosch 03/12/2015, 6:11 PM

## 2015-03-21 IMAGING — CR DG TIBIA/FIBULA 2V*L*
4 series · 4 of 4 positions shown · non-contrast
Comparison: None.

CLINICAL DATA: Left lower leg laceration.

LEFT TIBIA AND FIBULA - 2 VIEW

[x tib-fib ap left (1 of 2)]
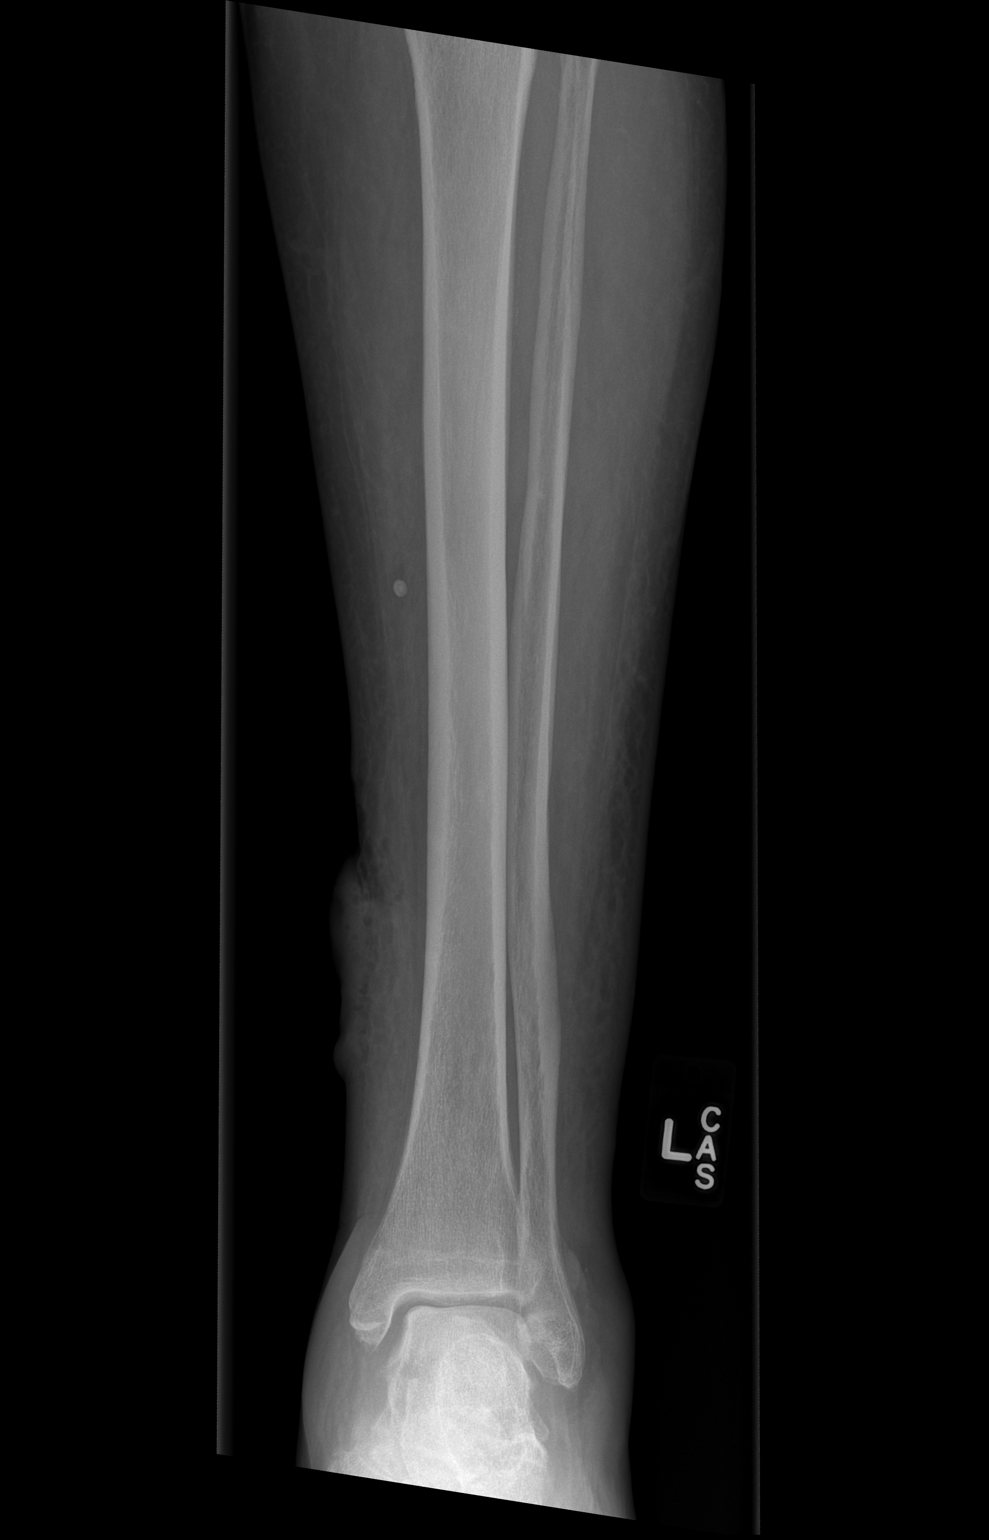

[x tib-fib ap left (2 of 2)]
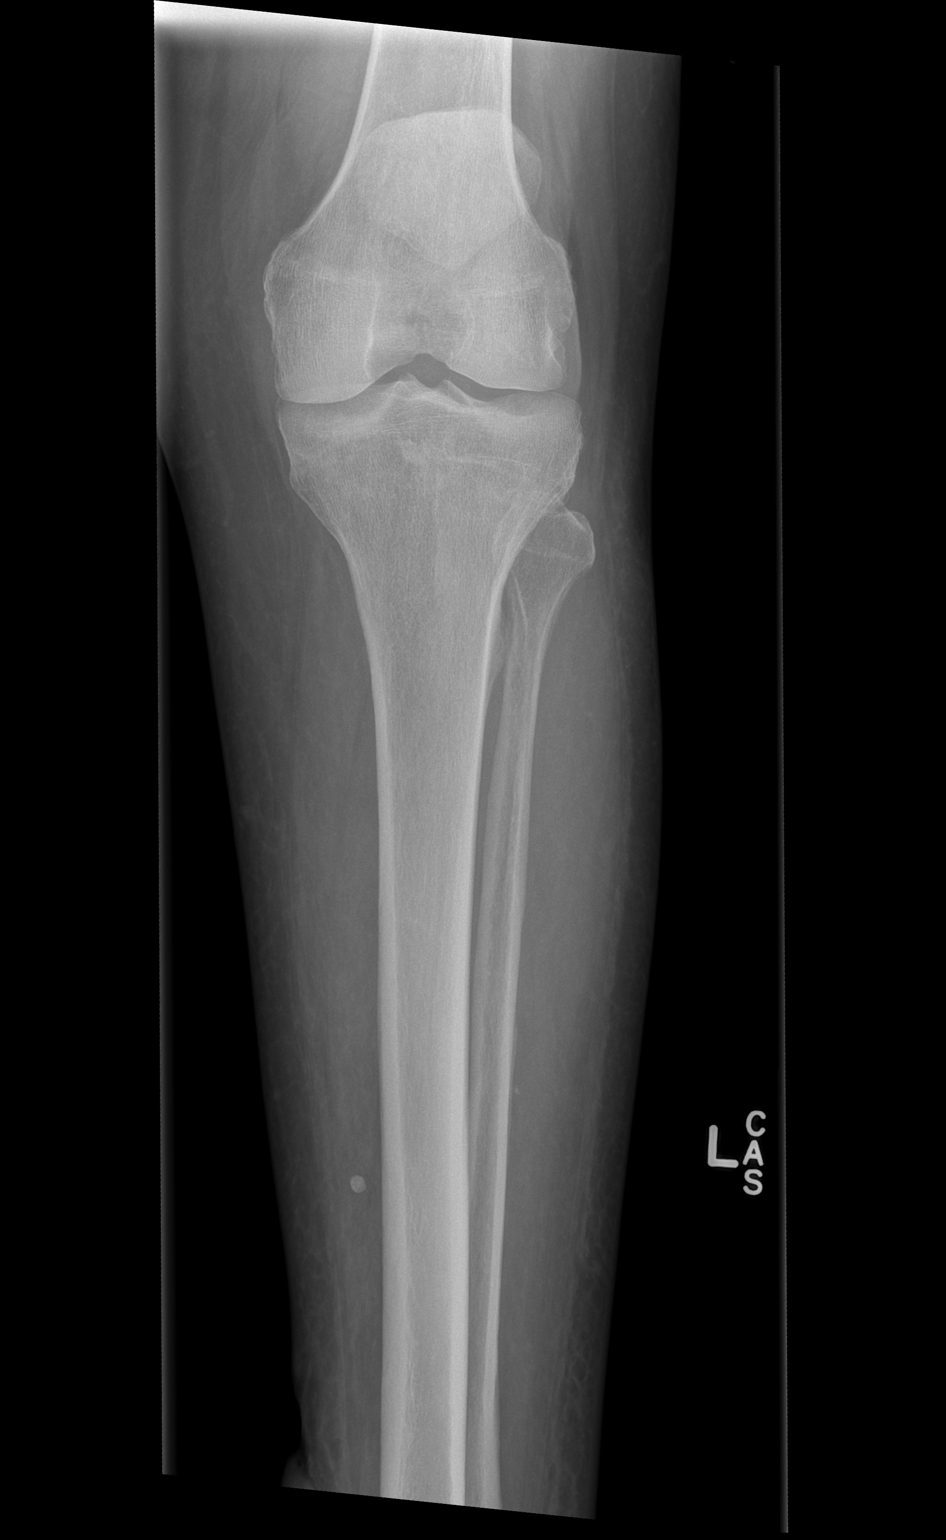

[x tib-fib lat left (1 of 2)]
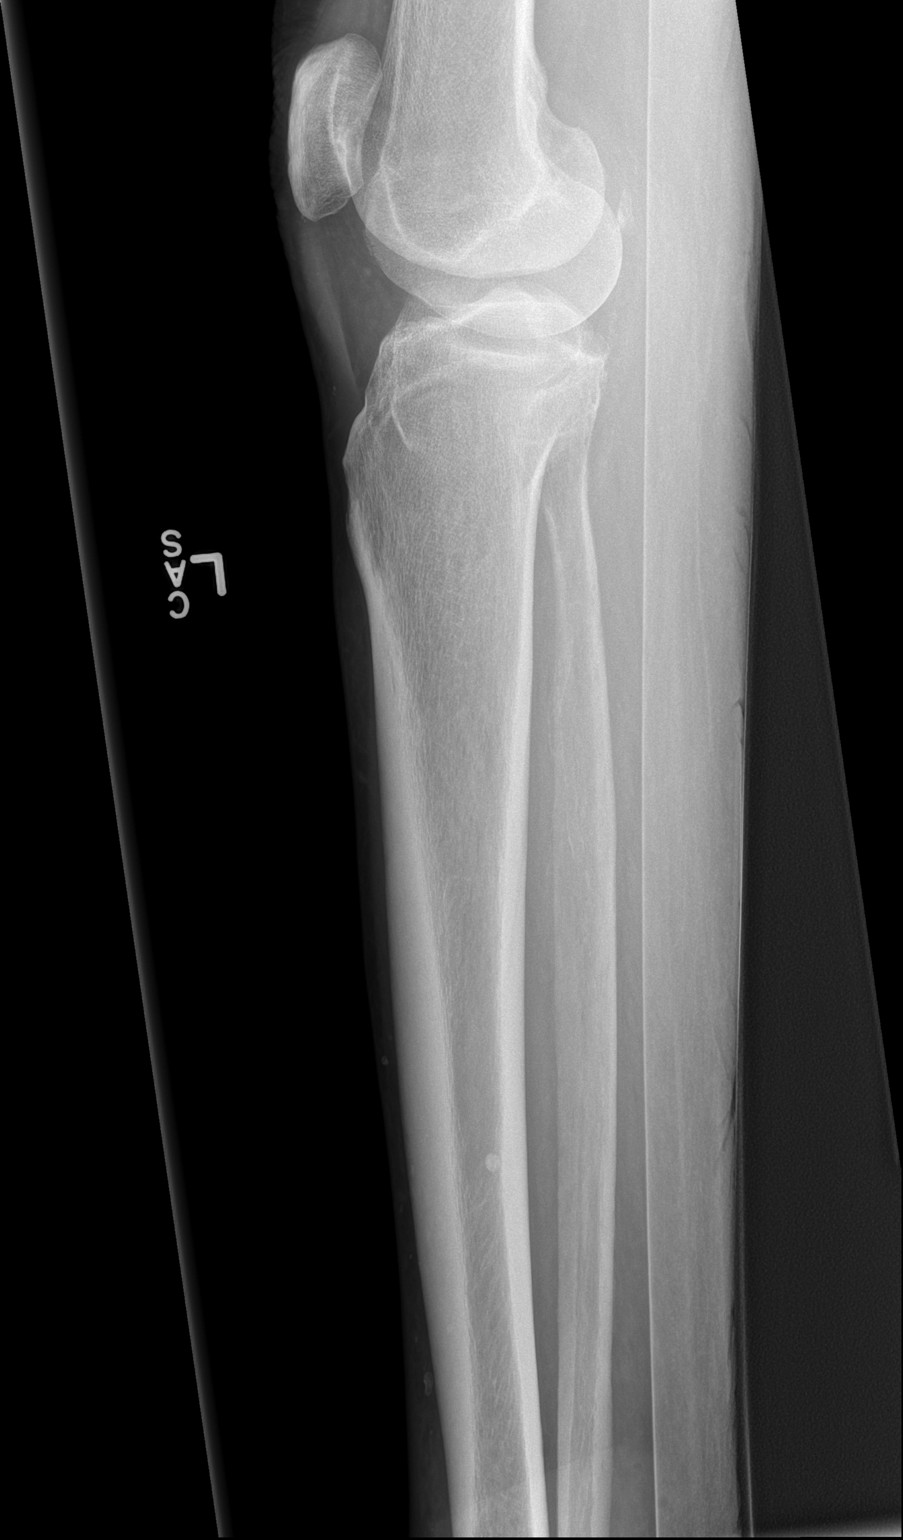

[x tib-fib lat left (2 of 2)]
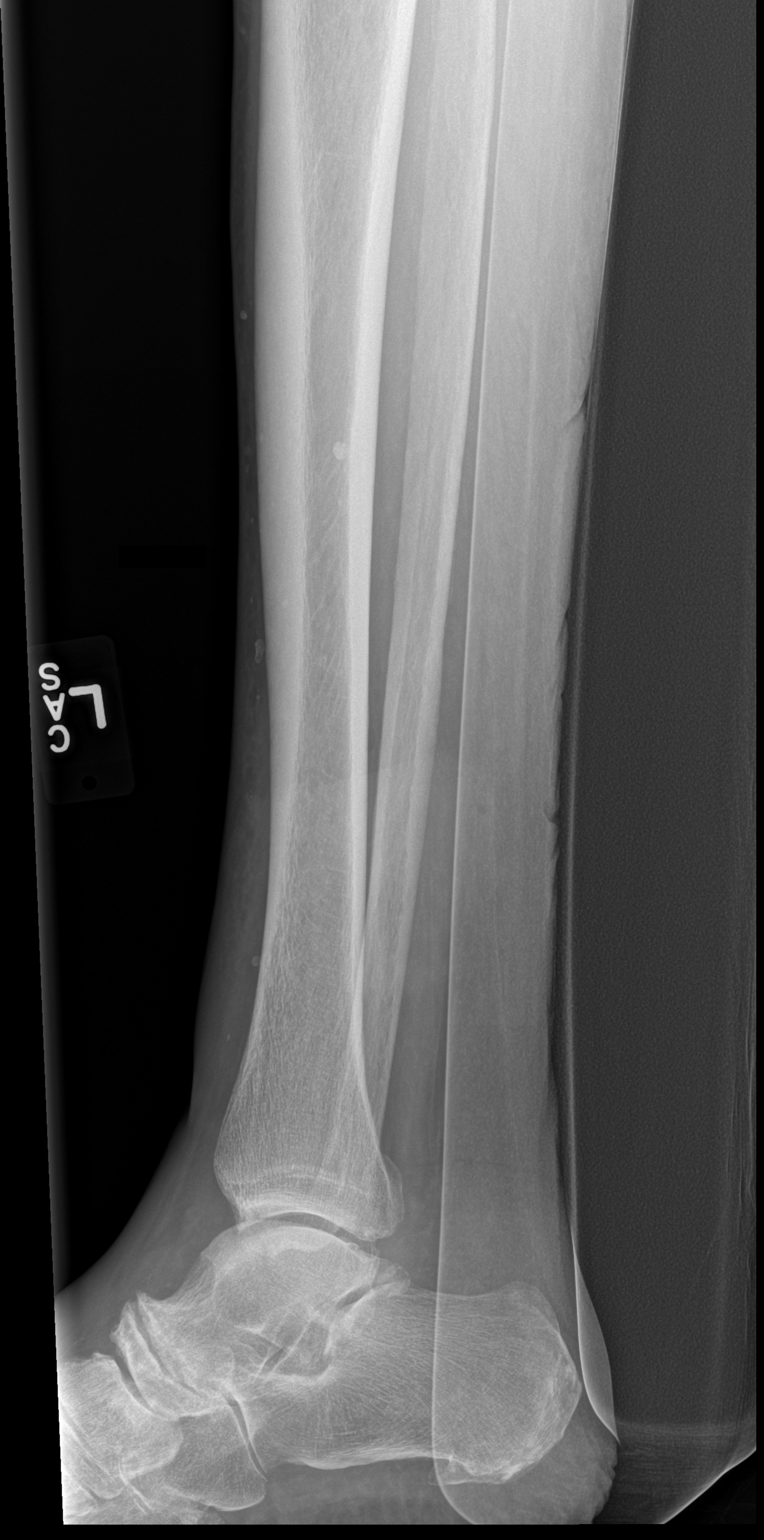

[4 of 4 positions shown; findings below may reference images not displayed]

FINDINGS: Skin defect along the medial aspect of the left lower leg
consistent with laceration is identified.  No radiopaque foreign
body or fracture is identified.
IMPRESSION: Laceration without underlying foreign body or fracture.

## 2015-03-25 ENCOUNTER — Other Ambulatory Visit: Payer: Medicare Other

## 2015-03-25 ENCOUNTER — Ambulatory Visit: Payer: Medicare Other

## 2015-03-25 ENCOUNTER — Ambulatory Visit: Payer: Medicare Other | Admitting: Oncology

## 2015-03-26 ENCOUNTER — Other Ambulatory Visit: Payer: Self-pay | Admitting: Orthopedic Surgery

## 2015-03-26 DIAGNOSIS — S42472D Displaced transcondylar fracture of left humerus, subsequent encounter for fracture with routine healing: Secondary | ICD-10-CM | POA: Diagnosis not present

## 2015-03-26 DIAGNOSIS — M25522 Pain in left elbow: Secondary | ICD-10-CM | POA: Diagnosis not present

## 2015-03-27 ENCOUNTER — Other Ambulatory Visit: Payer: Medicare Other

## 2015-03-27 ENCOUNTER — Ambulatory Visit: Payer: Medicare Other

## 2015-03-28 ENCOUNTER — Encounter (HOSPITAL_COMMUNITY): Payer: Self-pay | Admitting: *Deleted

## 2015-03-28 MED ORDER — CHLORHEXIDINE GLUCONATE 4 % EX LIQD: 60.0000 mL | Freq: Once | CUTANEOUS | Status: DC

## 2015-03-28 MED ORDER — CEFAZOLIN SODIUM-DEXTROSE 2-3 GM-% IV SOLR: 2.0000 g | INTRAVENOUS | Status: DC

## 2015-03-28 NOTE — Progress Notes (Signed)
I notified Lawernce Pitts with [employer] that patient is having surgery at 7:30 am.  Clarise Cruz stated that they will be notified repr if it needs interrogating after surgery.

## 2015-03-29 ENCOUNTER — Encounter (HOSPITAL_COMMUNITY)
Admission: RE | Disposition: A | Discharge status: Skilled Nursing Facility | Payer: Self-pay | Source: Ambulatory Visit | Attending: Orthopedic Surgery

## 2015-03-29 ENCOUNTER — Inpatient Hospital Stay (HOSPITAL_COMMUNITY): Payer: Worker's Compensation | Admitting: Certified Registered Nurse Anesthetist

## 2015-03-29 ENCOUNTER — Inpatient Hospital Stay (HOSPITAL_COMMUNITY)
Admission: RE | Admit: 2015-03-29 | Discharge: 2015-04-02 | DRG: 494 | Disposition: A | Discharge status: Skilled Nursing Facility | Payer: Worker's Compensation | Source: Ambulatory Visit | Attending: Orthopedic Surgery | Admitting: Orthopedic Surgery

## 2015-03-29 ENCOUNTER — Encounter (HOSPITAL_COMMUNITY): Payer: Self-pay | Admitting: *Deleted

## 2015-03-29 DIAGNOSIS — S51812A Laceration without foreign body of left forearm, initial encounter: Secondary | ICD-10-CM | POA: Diagnosis present

## 2015-03-29 DIAGNOSIS — R0602 Shortness of breath: Secondary | ICD-10-CM | POA: Diagnosis not present

## 2015-03-29 DIAGNOSIS — Z961 Presence of intraocular lens: Secondary | ICD-10-CM | POA: Diagnosis present

## 2015-03-29 DIAGNOSIS — Z950 Presence of cardiac pacemaker: Secondary | ICD-10-CM | POA: Diagnosis present

## 2015-03-29 DIAGNOSIS — J449 Chronic obstructive pulmonary disease, unspecified: Secondary | ICD-10-CM | POA: Diagnosis present

## 2015-03-29 DIAGNOSIS — J438 Other emphysema: Secondary | ICD-10-CM

## 2015-03-29 DIAGNOSIS — Z79899 Other long term (current) drug therapy: Secondary | ICD-10-CM | POA: Diagnosis not present

## 2015-03-29 DIAGNOSIS — J439 Emphysema, unspecified: Secondary | ICD-10-CM | POA: Diagnosis present

## 2015-03-29 DIAGNOSIS — D649 Anemia, unspecified: Secondary | ICD-10-CM

## 2015-03-29 DIAGNOSIS — Z87891 Personal history of nicotine dependence: Secondary | ICD-10-CM

## 2015-03-29 DIAGNOSIS — S42402A Unspecified fracture of lower end of left humerus, initial encounter for closed fracture: Secondary | ICD-10-CM | POA: Diagnosis present

## 2015-03-29 DIAGNOSIS — G4733 Obstructive sleep apnea (adult) (pediatric): Secondary | ICD-10-CM | POA: Diagnosis present

## 2015-03-29 DIAGNOSIS — D638 Anemia in other chronic diseases classified elsewhere: Secondary | ICD-10-CM | POA: Diagnosis present

## 2015-03-29 DIAGNOSIS — D509 Iron deficiency anemia, unspecified: Secondary | ICD-10-CM | POA: Diagnosis present

## 2015-03-29 DIAGNOSIS — K219 Gastro-esophageal reflux disease without esophagitis: Secondary | ICD-10-CM | POA: Diagnosis present

## 2015-03-29 DIAGNOSIS — I480 Paroxysmal atrial fibrillation: Secondary | ICD-10-CM | POA: Diagnosis present

## 2015-03-29 DIAGNOSIS — Z96651 Presence of right artificial knee joint: Secondary | ICD-10-CM | POA: Diagnosis present

## 2015-03-29 DIAGNOSIS — Z8546 Personal history of malignant neoplasm of prostate: Secondary | ICD-10-CM | POA: Diagnosis not present

## 2015-03-29 DIAGNOSIS — I825Z9 Chronic embolism and thrombosis of unspecified deep veins of unspecified distal lower extremity: Secondary | ICD-10-CM | POA: Diagnosis present

## 2015-03-29 DIAGNOSIS — Z9842 Cataract extraction status, left eye: Secondary | ICD-10-CM | POA: Diagnosis not present

## 2015-03-29 DIAGNOSIS — C61 Malignant neoplasm of prostate: Secondary | ICD-10-CM | POA: Diagnosis present

## 2015-03-29 DIAGNOSIS — Z7982 Long term (current) use of aspirin: Secondary | ICD-10-CM

## 2015-03-29 DIAGNOSIS — I10 Essential (primary) hypertension: Secondary | ICD-10-CM | POA: Diagnosis present

## 2015-03-29 DIAGNOSIS — I482 Chronic atrial fibrillation: Secondary | ICD-10-CM | POA: Diagnosis not present

## 2015-03-29 DIAGNOSIS — I4891 Unspecified atrial fibrillation: Secondary | ICD-10-CM | POA: Diagnosis present

## 2015-03-29 DIAGNOSIS — Z86718 Personal history of other venous thrombosis and embolism: Secondary | ICD-10-CM

## 2015-03-29 DIAGNOSIS — Z9841 Cataract extraction status, right eye: Secondary | ICD-10-CM

## 2015-03-29 DIAGNOSIS — I825Z1 Chronic embolism and thrombosis of unspecified deep veins of right distal lower extremity: Secondary | ICD-10-CM | POA: Diagnosis not present

## 2015-03-29 DIAGNOSIS — D500 Iron deficiency anemia secondary to blood loss (chronic): Secondary | ICD-10-CM | POA: Diagnosis present

## 2015-03-29 DIAGNOSIS — R509 Fever, unspecified: Secondary | ICD-10-CM | POA: Diagnosis not present

## 2015-03-29 HISTORY — PX: ORIF ELBOW FRACTURE: SHX5031

## 2015-03-29 LAB — BASIC METABOLIC PANEL
ANION GAP: 10 (ref 5–15)
BUN: 28 mg/dL — ABNORMAL HIGH (ref 6–20)
CALCIUM: 9.1 mg/dL (ref 8.9–10.3)
CO2: 23 mmol/L (ref 22–32)
CREATININE: 1.08 mg/dL (ref 0.61–1.24)
Chloride: 105 mmol/L (ref 101–111)
GFR, EST NON AFRICAN AMERICAN: 59 mL/min — AB (ref >60)
Glucose, Bld: 111 mg/dL — ABNORMAL HIGH (ref 65–99)
Potassium: 4.2 mmol/L (ref 3.5–5.1)
SODIUM: 138 mmol/L (ref 135–145)

## 2015-03-29 LAB — CBC
HCT: 25.6 % — ABNORMAL LOW (ref 39.0–52.0)
HEMOGLOBIN: 8.1 g/dL — AB (ref 13.0–17.0)
MCH: 30.1 pg (ref 26.0–34.0)
MCHC: 31.6 g/dL (ref 30.0–36.0)
MCV: 95.2 fL (ref 78.0–100.0)
PLATELETS: 206 10*3/uL (ref 150–400)
RBC: 2.69 MIL/uL — AB (ref 4.22–5.81)
RDW: 14.3 % (ref 11.5–15.5)
WBC: 5.6 10*3/uL (ref 4.0–10.5)

## 2015-03-29 SURGERY — OPEN REDUCTION INTERNAL FIXATION (ORIF) ELBOW/OLECRANON FRACTURE
Anesthesia: General | Site: Elbow | Laterality: Left

## 2015-03-29 MED ORDER — PANTOPRAZOLE SODIUM 40 MG PO TBEC
40.0000 mg | DELAYED_RELEASE_TABLET | Freq: Every day | ORAL | Status: DC
  Administered 2015-03-30 – 2015-04-02 (×4): 40 mg via ORAL
  Filled 2015-03-29 (×5): qty 1

## 2015-03-29 MED ORDER — FENTANYL CITRATE (PF) 100 MCG/2ML IJ SOLN
INTRAMUSCULAR | Status: DC | PRN
  Administered 2015-03-29: 50 ug via INTRAVENOUS
  Administered 2015-03-29: 25 ug via INTRAVENOUS
  Administered 2015-03-29: 50 ug via INTRAVENOUS
  Administered 2015-03-29: 25 ug via INTRAVENOUS
  Administered 2015-03-29: 50 ug via INTRAVENOUS

## 2015-03-29 MED ORDER — CEFAZOLIN SODIUM-DEXTROSE 2-3 GM-% IV SOLR
INTRAVENOUS | Status: DC | PRN
  Administered 2015-03-29: 2 g via INTRAVENOUS

## 2015-03-29 MED ORDER — PROPOFOL 10 MG/ML IV BOLUS
INTRAVENOUS | Status: DC | PRN
  Administered 2015-03-29: 120 mg via INTRAVENOUS

## 2015-03-29 MED ORDER — VITAMIN D 1000 UNITS PO TABS
1000.0000 [IU] | ORAL_TABLET | Freq: Every day | ORAL | Status: DC
  Administered 2015-03-29 – 2015-04-02 (×5): 1000 [IU] via ORAL
  Filled 2015-03-29 (×5): qty 1

## 2015-03-29 MED ORDER — MAGNESIUM CITRATE PO SOLN: 1.0000 | Freq: Once | ORAL | Status: DC | PRN

## 2015-03-29 MED ORDER — ONDANSETRON HCL 4 MG/2ML IJ SOLN: 4.0000 mg | Freq: Four times a day (QID) | INTRAMUSCULAR | Status: DC | PRN

## 2015-03-29 MED ORDER — LACTATED RINGERS IV SOLN
INTRAVENOUS | Status: DC
Start: 2015-03-29 — End: 2015-03-31
  Administered 2015-03-29: 07:00:00 via INTRAVENOUS

## 2015-03-29 MED ORDER — ONDANSETRON HCL 4 MG/2ML IJ SOLN: 4.0000 mg | Freq: Once | INTRAMUSCULAR | Status: DC | PRN

## 2015-03-29 MED ORDER — SENNA 8.6 MG PO TABS
1.0000 | ORAL_TABLET | Freq: Two times a day (BID) | ORAL | Status: DC
  Administered 2015-03-29 – 2015-04-02 (×9): 8.6 mg via ORAL
  Filled 2015-03-29 (×9): qty 1

## 2015-03-29 MED ORDER — FENTANYL CITRATE (PF) 250 MCG/5ML IJ SOLN
INTRAMUSCULAR | Status: AC
  Filled 2015-03-29: qty 5

## 2015-03-29 MED ORDER — CEFAZOLIN SODIUM 1-5 GM-% IV SOLN
1.0000 g | INTRAVENOUS | Status: AC
  Administered 2015-03-29: 1 g via INTRAVENOUS

## 2015-03-29 MED ORDER — VITAMIN B-12 1000 MCG PO TABS
500.0000 ug | ORAL_TABLET | Freq: Every day | ORAL | Status: DC
  Administered 2015-03-29 – 2015-04-02 (×5): 500 ug via ORAL
  Filled 2015-03-29 (×5): qty 1

## 2015-03-29 MED ORDER — FENTANYL CITRATE (PF) 100 MCG/2ML IJ SOLN: 25.0000 ug | INTRAMUSCULAR | Status: DC | PRN

## 2015-03-29 MED ORDER — MORPHINE SULFATE (PF) 2 MG/ML IV SOLN: 1.0000 mg | INTRAVENOUS | Status: DC | PRN

## 2015-03-29 MED ORDER — BUPIVACAINE HCL (PF) 0.25 % IJ SOLN
INTRAMUSCULAR | Status: AC
  Filled 2015-03-29: qty 30

## 2015-03-29 MED ORDER — MIDAZOLAM HCL 2 MG/2ML IJ SOLN
INTRAMUSCULAR | Status: AC
  Filled 2015-03-29: qty 2

## 2015-03-29 MED ORDER — PROPOFOL 10 MG/ML IV BOLUS
INTRAVENOUS | Status: AC
  Filled 2015-03-29: qty 20

## 2015-03-29 MED ORDER — ONDANSETRON HCL 4 MG/2ML IJ SOLN
INTRAMUSCULAR | Status: DC | PRN
  Administered 2015-03-29: 4 mg via INTRAVENOUS

## 2015-03-29 MED ORDER — OXYCODONE HCL 5 MG PO TABS: 5.0000 mg | ORAL_TABLET | Freq: Once | ORAL | Status: DC | PRN

## 2015-03-29 MED ORDER — PHENYLEPHRINE HCL 10 MG/ML IJ SOLN
INTRAMUSCULAR | Status: DC | PRN
  Administered 2015-03-29: 80 ug via INTRAVENOUS

## 2015-03-29 MED ORDER — ONDANSETRON HCL 4 MG PO TABS: 4.0000 mg | ORAL_TABLET | Freq: Four times a day (QID) | ORAL | Status: DC | PRN

## 2015-03-29 MED ORDER — OXYCODONE HCL 5 MG PO TABS
5.0000 mg | ORAL_TABLET | ORAL | Status: DC | PRN
  Administered 2015-03-30 – 2015-03-31 (×8): 5 mg via ORAL
  Administered 2015-04-01 (×2): 10 mg via ORAL
  Administered 2015-04-01: 5 mg via ORAL
  Administered 2015-04-01: 10 mg via ORAL
  Filled 2015-03-29: qty 2
  Filled 2015-03-29 (×2): qty 1
  Filled 2015-03-29: qty 2
  Filled 2015-03-29 (×2): qty 1
  Filled 2015-03-29: qty 2
  Filled 2015-03-29 (×5): qty 1

## 2015-03-29 MED ORDER — LACTATED RINGERS IV SOLN
INTRAVENOUS | Status: DC
  Administered 2015-03-29: 14:00:00 via INTRAVENOUS

## 2015-03-29 MED ORDER — FUROSEMIDE 40 MG PO TABS
40.0000 mg | ORAL_TABLET | Freq: Every day | ORAL | Status: DC
  Administered 2015-03-29 – 2015-04-02 (×5): 40 mg via ORAL
  Filled 2015-03-29 (×5): qty 1

## 2015-03-29 MED ORDER — ALPRAZOLAM 0.5 MG PO TABS: 0.5000 mg | ORAL_TABLET | Freq: Four times a day (QID) | ORAL | Status: DC | PRN

## 2015-03-29 MED ORDER — LISINOPRIL 10 MG PO TABS
10.0000 mg | ORAL_TABLET | Freq: Two times a day (BID) | ORAL | Status: DC
  Administered 2015-03-29 – 2015-04-02 (×8): 10 mg via ORAL
  Filled 2015-03-29 (×9): qty 1

## 2015-03-29 MED ORDER — UMECLIDINIUM-VILANTEROL 62.5-25 MCG/INH IN AEPB
1.0000 | INHALATION_SPRAY | Freq: Every day | RESPIRATORY_TRACT | Status: DC
  Administered 2015-03-31: 1 via RESPIRATORY_TRACT

## 2015-03-29 MED ORDER — LIDOCAINE HCL (CARDIAC) 20 MG/ML IV SOLN
INTRAVENOUS | Status: DC | PRN
  Administered 2015-03-29: 50 mg via INTRAVENOUS

## 2015-03-29 MED ORDER — OXYCODONE HCL 5 MG/5ML PO SOLN: 5.0000 mg | Freq: Once | ORAL | Status: DC | PRN

## 2015-03-29 MED ORDER — CEFAZOLIN SODIUM 1-5 GM-% IV SOLN
1.0000 g | Freq: Three times a day (TID) | INTRAVENOUS | Status: DC
  Administered 2015-03-29 – 2015-04-02 (×10): 1 g via INTRAVENOUS
  Filled 2015-03-29 (×13): qty 50

## 2015-03-29 MED ORDER — FUROSEMIDE 40 MG PO TABS: 40.0000 mg | ORAL_TABLET | Freq: Every day | ORAL | Status: DC | PRN

## 2015-03-29 MED ORDER — BACITRACIN ZINC 500 UNIT/GM EX OINT
TOPICAL_OINTMENT | CUTANEOUS | Status: AC
  Filled 2015-03-29: qty 56.7

## 2015-03-29 MED ORDER — SUCCINYLCHOLINE CHLORIDE 20 MG/ML IJ SOLN
INTRAMUSCULAR | Status: DC | PRN
  Administered 2015-03-29: 100 mg via INTRAVENOUS

## 2015-03-29 MED ORDER — CEFAZOLIN SODIUM 1-5 GM-% IV SOLN
INTRAVENOUS | Status: AC
  Administered 2015-03-29: 1 g via INTRAVENOUS
  Filled 2015-03-29: qty 50

## 2015-03-29 MED ORDER — FENTANYL CITRATE (PF) 100 MCG/2ML IJ SOLN
INTRAMUSCULAR | Status: AC
  Filled 2015-03-29: qty 2

## 2015-03-29 MED ORDER — OXYBUTYNIN CHLORIDE ER 15 MG PO TB24
15.0000 mg | ORAL_TABLET | ORAL | Status: DC
  Administered 2015-03-30 – 2015-04-01 (×2): 15 mg via ORAL
  Filled 2015-03-29 (×2): qty 1

## 2015-03-29 MED ORDER — PHENYLEPHRINE HCL 10 MG/ML IJ SOLN
10.0000 mg | INTRAVENOUS | Status: DC | PRN
  Administered 2015-03-29: 15 ug/min via INTRAVENOUS

## 2015-03-29 MED ORDER — 0.9 % SODIUM CHLORIDE (POUR BTL) OPTIME
TOPICAL | Status: DC | PRN
  Administered 2015-03-29: 1000 mL

## 2015-03-29 MED ORDER — ASPIRIN EC 81 MG PO TBEC
81.0000 mg | DELAYED_RELEASE_TABLET | Freq: Every day | ORAL | Status: DC
Start: 2015-03-30 — End: 2015-04-02
  Administered 2015-03-30 – 2015-04-02 (×4): 81 mg via ORAL
  Filled 2015-03-29 (×5): qty 1

## 2015-03-29 SURGICAL SUPPLY — 76 items
1.4MM K-WIRE ×4 IMPLANT
BANDAGE ACE 6X5 VEL STRL LF (GAUZE/BANDAGES/DRESSINGS) ×2 IMPLANT
BANDAGE ELASTIC 3 VELCRO ST LF (GAUZE/BANDAGES/DRESSINGS) ×1 IMPLANT
BANDAGE ELASTIC 4 VELCRO ST LF (GAUZE/BANDAGES/DRESSINGS) ×3 IMPLANT
BIT DRILL 2.0 (BIT) ×2
BIT DRILL 2.0MM (BIT) ×1
BIT DRILL 2XNS DISP SS SM FRAG (BIT) IMPLANT
BIT DRILL 4.8X200 CANN (BIT) ×2 IMPLANT
BIT DRL 2XNS DISP SS SM FRAG (BIT) ×1
BNDG CMPR 9X4 STRL LF SNTH (GAUZE/BANDAGES/DRESSINGS) ×1
BNDG COHESIVE 4X5 TAN STRL (GAUZE/BANDAGES/DRESSINGS) ×3 IMPLANT
BNDG ESMARK 4X9 LF (GAUZE/BANDAGES/DRESSINGS) ×3 IMPLANT
BNDG GAUZE ELAST 4 BULKY (GAUZE/BANDAGES/DRESSINGS) ×5 IMPLANT
CABLE CERCLAGE W/NDL CRIMP (Cable) IMPLANT
CABLE CERLAGE W/NEEDLE CRIMP (Cable) ×3 IMPLANT
CORDS BIPOLAR (ELECTRODE) ×3 IMPLANT
COVER BACK TABLE 60X90IN (DRAPES) ×2 IMPLANT
COVER MAYO STAND STRL (DRAPES) ×3 IMPLANT
COVER SURGICAL LIGHT HANDLE (MISCELLANEOUS) ×3 IMPLANT
CUFF TOURNIQUET SINGLE 18IN (TOURNIQUET CUFF) ×3 IMPLANT
CUFF TOURNIQUET SINGLE 24IN (TOURNIQUET CUFF) IMPLANT
DRAIN HEMOVAC 7FR (DRAIN) ×2 IMPLANT
DRAPE INCISE IOBAN 66X45 STRL (DRAPES) ×3 IMPLANT
DRAPE OEC MINIVIEW 54X84 (DRAPES) ×2 IMPLANT
DRILL BIT ×2 IMPLANT
DRSG ADAPTIC 3X8 NADH LF (GAUZE/BANDAGES/DRESSINGS) ×2 IMPLANT
DRSG MEPITEL 4X7.2 (GAUZE/BANDAGES/DRESSINGS) ×2 IMPLANT
DRSG PAD ABDOMINAL 8X10 ST (GAUZE/BANDAGES/DRESSINGS) ×2 IMPLANT
GAUZE SPONGE 4X4 12PLY STRL (GAUZE/BANDAGES/DRESSINGS) ×3 IMPLANT
GAUZE XEROFORM 1X8 LF (GAUZE/BANDAGES/DRESSINGS) ×1 IMPLANT
GAUZE XEROFORM 5X9 LF (GAUZE/BANDAGES/DRESSINGS) ×2 IMPLANT
GLOVE BIOGEL M STRL SZ7.5 (GLOVE) ×3 IMPLANT
GLOVE SS BIOGEL STRL SZ 8 (GLOVE) ×1 IMPLANT
GLOVE SUPERSENSE BIOGEL SZ 8 (GLOVE) ×2
GOWN STRL REUS W/ TWL LRG LVL3 (GOWN DISPOSABLE) ×2 IMPLANT
GOWN STRL REUS W/ TWL XL LVL3 (GOWN DISPOSABLE) ×3 IMPLANT
GOWN STRL REUS W/TWL LRG LVL3 (GOWN DISPOSABLE) ×6
GOWN STRL REUS W/TWL XL LVL3 (GOWN DISPOSABLE) ×9
KIT BASIN OR (CUSTOM PROCEDURE TRAY) ×3 IMPLANT
KIT ROOM TURNOVER OR (KITS) ×3 IMPLANT
LOOP VESSEL MAXI BLUE (MISCELLANEOUS) ×2 IMPLANT
MANIFOLD NEPTUNE II (INSTRUMENTS) ×3 IMPLANT
NDL HYPO 25GX1X1/2 BEV (NEEDLE) IMPLANT
NEEDLE HYPO 25GX1X1/2 BEV (NEEDLE) IMPLANT
NS IRRIG 1000ML POUR BTL (IV SOLUTION) ×3 IMPLANT
PACK ORTHO EXTREMITY (CUSTOM PROCEDURE TRAY) ×3 IMPLANT
PAD ARMBOARD 7.5X6 YLW CONV (MISCELLANEOUS) ×10 IMPLANT
PAD CAST 4YDX4 CTTN HI CHSV (CAST SUPPLIES) ×2 IMPLANT
PADDING CAST ABS 4INX4YD NS (CAST SUPPLIES) ×2
PADDING CAST ABS 6INX4YD NS (CAST SUPPLIES) ×2
PADDING CAST ABS COTTON 4X4 ST (CAST SUPPLIES) IMPLANT
PADDING CAST ABS COTTON 6X4 NS (CAST SUPPLIES) IMPLANT
PADDING CAST COTTON 4X4 STRL (CAST SUPPLIES) ×6
PIN GUIDE DRILL TIP 2.8X300 (DRILL) ×4 IMPLANT
PUTTY DBM STAGRAFT PLUS 5CC (Putty) ×2 IMPLANT
SCREW CANN 80X40X6.5 NS (Screw) IMPLANT
SCREW CANNULATED 6.5X80MM (Screw) ×3 IMPLANT
SCREW MAX VPC 4.0X34MM (Screw) ×4 IMPLANT
SCREW MAX VPC 4.0X36MM (Screw) ×2 IMPLANT
SCREW MAX VPC 4.0X40MM (Screw) ×1 IMPLANT
SLING ARM FOAM STRAP XLG (SOFTGOODS) ×2 IMPLANT
SOLUTION BETADINE 4OZ (MISCELLANEOUS) ×3 IMPLANT
SPECIMEN JAR SMALL (MISCELLANEOUS) ×3 IMPLANT
SPLINT FIBERGLASS 4X30 (CAST SUPPLIES) ×2 IMPLANT
SPONGE SCRUB IODOPHOR (GAUZE/BANDAGES/DRESSINGS) ×3 IMPLANT
SUT ETHILON 2 0 FS 18 (SUTURE) ×2 IMPLANT
SUT PROLENE 3 0 PS 2 (SUTURE) ×8 IMPLANT
SUT VIC AB 3-0 FS2 27 (SUTURE) ×9 IMPLANT
SYR CONTROL 10ML LL (SYRINGE) IMPLANT
TOWEL OR 17X24 6PK STRL BLUE (TOWEL DISPOSABLE) ×3 IMPLANT
TOWEL OR 17X26 10 PK STRL BLUE (TOWEL DISPOSABLE) ×6 IMPLANT
TUBE CONNECTING 12'X1/4 (SUCTIONS) ×2
TUBE CONNECTING 12X1/4 (SUCTIONS) ×2 IMPLANT
UNDERPAD 30X30 INCONTINENT (UNDERPADS AND DIAPERS) ×3 IMPLANT
WASHER FLAT 6.5MM (Washer) ×2 IMPLANT
WATER STERILE IRR 1000ML POUR (IV SOLUTION) ×3 IMPLANT

## 2015-03-29 NOTE — Anesthesia Postprocedure Evaluation (Signed)
Anesthesia Post Note  Patient: [name] [name]  Procedure(s) Performed: Procedure(s) (LRB): LEFT ELBOW OPEN REDUCTION INTERNAL FIXATION (ORIF) DISTAL HUMERUS FRACTURE WITH OLECRANON OSTEOTOMY AND ULNAR NERVE RELEASE (Left)  Patient location during evaluation: PACU Anesthesia Type: General and Regional Level of consciousness: awake and oriented Pain management: pain level controlled Vital Signs Assessment: post-procedure vital signs reviewed and stable Respiratory status: nonlabored ventilation, respiratory function stable and spontaneous breathing Anesthetic complications: no    Last Vitals:  Filed Vitals:   03/29/15 1108 03/29/15 1123  BP: 145/66 144/54  Pulse: 94 62  Temp: 36.4 C   Resp:  12    Last Pain: There were no vitals filed for this visit.               Erez Mccallum COKER

## 2015-03-29 NOTE — Consult Note (Signed)
[facility] Medical Consultation  [name] [name] [id number] DOB: 07-Jun-1924 DOA: 03/29/2015 PCP: Mathews Argyle, MD   Requesting physician: Orthopedics Date of consultation: 03/29/15 Reason for consultation: Chronic medical issues management  Impression/Recommendations Active Problems:   Anemia   COPD (chronic obstructive pulmonary disease) with emphysema ([place])   Pacemaker   Prostate cancer [facility])   Atrial fibrillation ([place])   DVT, lower extremity, distal, chronic ([place])   Essential hypertension, benign    Status post left ORIF: He was Admitted for left Elbow ORIFOn 03/29/2015, tolerating the procedure well, without any complications, and with minimal blood loss. This is followed by orthopedics.                              Anemia of Chronic disease/Iron deficiency His current hemoglobin is 8.1, with  a baseline hemoglobin of 11. The patient receives  Aranesp injections every 2 months, the last one on 01/31/2015, and this is being followed at the [facility]. Current MCV is normal at 95.2. We will continue to monitor while in the hospital.  No transfusion is indicated at this time.  Atrial Fibrillation Stable. Rate controlled with Prinivil and Daily aspirin. The patient is not on anticoagulation due to history of GI bleed EF %50-55%(third 2-D echo on 2014. Myoview scan 10/13 demonstrated an EF of 48-56% without evidence of reversible ischemia.  We will continue his home meds   COPD/OSA (CPAP)This is stable, the patient is not on acute distress.  Continue his 1 puff a day of Umeclidinium/Vilanterol as at home CPAP at night recommended  History of LLE DVT, diagnosed in 2013 with no new recurrence Continue aspirin daily as prior to admission SCDs for DVT prophylaxis PT/OT recommended to increase mobility   Hypertension Stable. Continue home anti-hypertensive medications.    Will follow Thank you for this consultation.  Chief Complaint:  HPI:   82 year old male admitted by the orthopedic team for scheduled left elbow reconstruction due to left comminuted elbow fracture sustained On 03/12/2015 after a fall. At the time, he has significant amount of bruising and swelling as well as decreased mobility.He underwent the surgery without any complications. However, he has a significant amount of chronic medical problems including anemia, COPD, OSA, atrial fibrillation not a candidate for anticoagulation due to "cold feeling" effect, on aspirin, pacemaker ,and a remote history of prostate cancer followed at the [place]. He is not a diabetic. Clinically, has been stable prior to the surgery. He is seen at the PACU. He is recuperating well,denying SOB, CP, n/v/ diarrhea or confusion. He reports swelling at the left elbow area, now on sling and pain meds. No other issues are verbalized. His labs are remarkable for Hemoglobin of 8.1, with baseline of 10.7. with minimal blood loss. The patient receives Aranesp, last dose on 01/21/2015. His white count and platelet count are normal. His CMET is essentially unremarkable, with normal creatinine.  Review of Systems  Constitutional: Negative.        He was in his usual state of health till his mechanical fall  HENT: Negative.   Eyes: Negative.   Respiratory: Negative.   Cardiovascular: Negative for chest pain, palpitations, orthopnea, claudication and PND.       Minimal leg swelling, left arm swelling due to surgery  Gastrointestinal: Negative.   Genitourinary: Negative.   Musculoskeletal: Positive for joint pain.       Left elbow post surgical pain  Skin: Negative for itching  and rash.  Neurological: Negative.   Endo/Heme/Allergies: Negative for environmental allergies and polydipsia. Bruises/bleeds easily.  Psychiatric/Behavioral: Negative.      Past Medical History  Diagnosis Date  . Hypertension   . Hearing loss   . Leg swelling 10/19/2011    LLE  . COPD (chronic obstructive pulmonary  disease) ([place]) 10/19/2011    "touch"  . Exertional dyspnea 10/19/2011  . OSA on CPAP 10/19/2011    "sometimes wear CPAP"  . Prostate cancer ([place])     S/P radiation tx ~ 2008  . History of blood transfusion     "think it was related to pacemaker placement"  . GERD (gastroesophageal reflux disease)   . Arthritis     "back"  . Chronic lower back pain   . Skin change     12-24-14 few scalp lesions burned off"old age spots"  . Anemia     occ. injections for this at [facility]  . Pacemaker     [employer]   Past Surgical History  Procedure Laterality Date  . Hernia repair  ~ 2010;     lap; VHR  . Hernia repair  ~ [id number]    lap vh complicated by recurrence  . Hernia repair  09/2011    open; VHR  . Insert / replace / remove pacemaker  ~ 2009    initial placement  . Cataract extraction w/ intraocular lens  implant, bilateral    . Joint replacement    . Total knee arthroplasty  1990's    right  . I&d extremity Left 10/20/2012    Procedure: LEFT LEG IRRIGATION AND DEBRIDEMENT, AND WOUND VAC APPLICATION;  Surgeon: Marianna Payment, MD;  [facility]: [facility];  Service: Orthopedics;  Laterality: Left;  . I&d extremity Left 10/22/2012    Procedure: IRRIGATION AND DEBRIDEMENT EXTREMITY;  Surgeon: Marianna Payment, MD;  [facility];  Service: Orthopedics;  Laterality: Left;  . Skin split graft Left 10/22/2012    Procedure: SKIN GRAFT SPLIT THICKNESS;  Surgeon: Marianna Payment, MD;  [facility];  Service: Orthopedics;  Laterality: Left;  . Carpal tunnel release Left 11/14/2014    Procedure: CARPAL TUNNEL RELEASE LEFT THUMB;  Surgeon: Daryll Brod, MD;  Location: [facility];  Service: Orthopedics;  Laterality: Left;  ANESTHESIA:  IV REGIONAL FAB  . Esophagogastroduodenoscopy (egd) with propofol N/A 01/14/2015    Procedure: ESOPHAGOGASTRODUODENOSCOPY (EGD) WITH PROPOFOL;  Surgeon: Garlan Fair, MD;  [facility];  Service: Endoscopy;  Laterality: N/A;  . Balloon  dilation N/A 01/14/2015    Procedure: BALLOON DILATION;  Surgeon: Garlan Fair, MD;  [facility];  Service: Endoscopy;  Laterality: N/A;  . Cardiac catheterization    . Eye surgery Bilateral     cataract  . Cholecystectomy  10/23/2011  . Cholecystectomy  10/23/2011    Procedure: CHOLECYSTECTOMY;  Surgeon: Rolm Bookbinder, MD;  Location: [place];  Service: General;  Laterality: N/A;  with intraoperative cholangiogram   Social History:  reports that he quit smoking about 42 years ago. His smoking use included Cigarettes. He has a 52.5 pack-year smoking history. He has never used smokeless tobacco. He reports that he does not drink alcohol or use illicit drugs.  No Known Allergies Family History  Problem Relation Age of Onset  . Heart disease Father   . Heart disease Mother   . Colon cancer Mother   . Heart attack Neg Hx   . Hypertension Neg Hx   . Stroke  Mother     Prior to Admission medications   Medication Sig Start Date End Date Taking? Authorizing Provider  aspirin EC 81 MG tablet Take 81 mg by mouth daily.   Yes Historical Provider, MD  Calcium 600-200 MG-UNIT per tablet Take 1 tablet by mouth 2 (two) times daily.    Yes Historical Provider, MD  cholecalciferol (VITAMIN D) 1000 units tablet Take 1,000 Units by mouth daily.   Yes Historical Provider, MD  darbepoetin alfa-polysorbate (ARANESP, ALB FREE, SURECLICK) XX123456 MCG/ML injection Inject 500 mcg into the skin See admin instructions. Every 2 months   Yes Historical Provider, MD  furosemide (LASIX) 40 MG tablet Take 40-80 mg by mouth See admin instructions. Take 40mg  daily.  May take an additional 40mg  for leg swelling   Yes Historical Provider, MD  lisinopril (PRINIVIL,ZESTRIL) 20 MG tablet Take 10 mg by mouth 2 (two) times daily.    Yes Historical Provider, MD  Multiple Vitamins-Minerals (PRESERVISION AREDS PO) Take 1 tablet by mouth 2 (two) times daily.   Yes Historical Provider, MD  Omega-3 Fatty Acids (FISH OIL) 1200  MG CAPS Take 1 capsule by mouth daily.     Yes Historical Provider, MD  omeprazole (PRILOSEC) 20 MG capsule Take 20 mg by mouth daily.    Yes Historical Provider, MD  oxybutynin (DITROPAN XL) 15 MG 24 hr tablet Take 15 mg by mouth every other day.    Yes Historical Provider, MD  oxyCODONE-acetaminophen (PERCOCET/ROXICET) 5-325 MG per tablet Take 1 tablet by mouth every 6 (six) hours as needed for moderate pain or severe pain.   Yes Historical Provider, MD  Umeclidinium-Vilanterol (ANORO ELLIPTA) 62.5-25 MCG/INH AEPB Inhale 1 puff into the lungs daily.    Yes Historical Provider, MD  vitamin B-12 (CYANOCOBALAMIN) 500 MCG tablet Take 500 mcg by mouth daily.    Yes Historical Provider, MD   Physical Exam: Blood pressure 145/66, pulse 94, temperature 98.5 F (36.9 C), temperature source Oral, resp. rate 16, SpO2 100 %. Filed Vitals:   03/29/15 0650 03/29/15 1108  BP: 167/54 145/66  Pulse: 62 94  Temp: 98.5 F (36.9 C)   Resp: 16     Physical Exam  Constitutional: He is oriented to person, place, and time and well-developed, well-nourished, and in no distress.  HENT:  Head: Normocephalic and atraumatic.  Mouth/Throat: No oropharyngeal exudate.  edentulous  Eyes: Pupils are equal, round, and reactive to light. No scleral icterus.  Neck: Neck supple. No JVD present. No thyromegaly present.  Cardiovascular: Exam reveals no gallop and no friction rub.   No murmur heard. Rate controlled rhythm in the setting of Afib Left pacemaker in oplace  Pulmonary/Chest: Effort normal and breath sounds normal. No stridor. No respiratory distress. He has no wheezes. He has no rales. He exhibits no tenderness.  Abdominal: Soft. Bowel sounds are normal. He exhibits no distension and no mass. There is no tenderness. There is no rebound and no guarding.  Musculoskeletal: He exhibits edema and tenderness.  Left upper extremity edema at the surgical site Sling present  Lymphadenopathy:    He has no cervical  adenopathy.  Neurological: He is alert and oriented to person, place, and time.  Skin: Skin is warm. No rash noted. No erythema. No pallor.  Psychiatric: Mood, memory and judgment normal.   Labs on Admission:  Basic Metabolic Panel:  Recent Labs Lab 03/29/15 0719  NA 138  K 4.2  CL 105  CO2 23  GLUCOSE 111*  BUN 28*  CREATININE 1.08  CALCIUM 9.1   CBC:  Recent Labs Lab 03/29/15 0719  WBC 5.6  HGB 8.1*  HCT 25.6*  MCV 95.2  PLT 206    Radiological Exams on Admission: No results found.  EKG: Last EKG 09/2014 was stable for A fib    [facility]   If 7PM-7AM, please contact night-coverage [email or web address] Password [facility] 03/29/2015, 11:17 AM

## 2015-03-29 NOTE — Transfer of Care (Signed)
Immediate Anesthesia Transfer of Care Note  Patient: [name] [name]  Procedure(s) Performed: Procedure(s): LEFT ELBOW OPEN REDUCTION INTERNAL FIXATION (ORIF) DISTAL HUMERUS FRACTURE WITH OLECRANON OSTEOTOMY AND ULNAR NERVE RELEASE (Left)  Patient Location: PACU  Anesthesia Type:General  Level of Consciousness: awake, alert  and oriented  Airway & Oxygen Therapy: Patient Spontanous Breathing and Patient connected to nasal cannula oxygen  Post-op Assessment: Report given to RN and Post -op Vital signs reviewed and stable  Post vital signs: Reviewed and stable  Last Vitals:  Filed Vitals:   03/29/15 0650  BP: 167/54  Pulse: 62  Temp: 36.9 C  Resp: 16    Complications: No apparent anesthesia complications

## 2015-03-29 NOTE — Anesthesia Preprocedure Evaluation (Addendum)
Anesthesia Evaluation  Patient identified by MRN, date of birth, ID band Patient awake    Reviewed: Allergy & Precautions, H&P , NPO status , Patient's Chart, lab work & pertinent test results  History of Anesthesia Complications Negative for: history of anesthetic complications  Airway Mallampati: I  TM Distance: >3 FB Neck ROM: Full    Dental  (+) Dental Advisory Given, Edentulous Upper, Edentulous Lower   Pulmonary shortness of breath and with exertion, sleep apnea and Continuous Positive Airway Pressure Ventilation , COPD, former smoker,    breath sounds clear to auscultation+ rhonchi        Cardiovascular hypertension, Pt. on medications + DVT  (-) Peripheral Vascular Disease + dysrhythmias Atrial Fibrillation + pacemaker  Rhythm:Regular Rate:Normal  Echo 10/2011 Study Conclusions  - Left ventricle: The cavity size was normal. There was mild  concentric hypertrophy. Systolic function was mildly to  moderately reduced. The estimated ejection fraction was in  the range of 40% to 45%. Severe hypokinesis to akinesis of  the basal-mid inferior myocardium. - Aortic valve: Trivial regurgitation. - Mitral valve: Calcified annulus. Mild regurgitation. - Left atrium: The atrium was mildly dilated. - Atrial septum: No defect or patent foramen ovale was   identified. - Pulmonary arteries: PA peak pressure: 19mm Hg (S).   Pacer:  [employer], interrogation 09/2014 DDDR, 3% AP-VP, 69% AP-VS, 26% AS-VS Longevity 4.5 yrs.    Neuro/Psych negative neurological ROS  negative psych ROS   GI/Hepatic Neg liver ROS, GERD  Medicated and Controlled,  Endo/Other  negative endocrine ROS  Renal/GU negative Renal ROS     Musculoskeletal  (+) Arthritis ,   Abdominal   Peds  Hematology negative hematology ROS (+) anemia ,   Anesthesia Other Findings   Reproductive/Obstetrics                           Anesthesia  Physical  Anesthesia Plan  ASA: III  Anesthesia Plan:    Post-op Pain Management:    Induction: Intravenous  Airway Management Planned:   Additional Equipment:   Intra-op Plan:   Post-operative Plan:   Informed Consent: I have reviewed the patients History and Physical, chart, labs and discussed the procedure including the risks, benefits and alternatives for the proposed anesthesia with the patient or authorized representative who has indicated his/her understanding and acceptance.   Dental advisory given  Plan Discussed with: Anesthesiologist, Surgeon and CRNA  Anesthesia Plan Comments: (L Elbow fracture H/O afib/sinus node dysfunction with DDD pacemaker htn Mild COPD Sleep apnea, uses CPAP intermittantly  Plan GA with supraclavicular block)       Anesthesia Quick Evaluation

## 2015-03-29 NOTE — H&P (Signed)
[name] [name] is an 82 y.o. male.   Chief Complaint: left elbow fracture plan ORIF HPI: presents for reconstruction left elbow No other complaints  D/w wife his Hct- sees cancer center for this Reg MD is [facility] [place] Denies neck back chest abd pain  Past Medical History  Diagnosis Date  . Hypertension   . Hearing loss   . Leg swelling 10/19/2011    LLE  . COPD (chronic obstructive pulmonary disease) ([place]) 10/19/2011    "touch"  . Exertional dyspnea 10/19/2011  . OSA on CPAP 10/19/2011    "sometimes wear CPAP"  . Prostate cancer ([place])     S/P radiation tx ~ 2008  . History of blood transfusion     "think it was related to pacemaker placement"  . GERD (gastroesophageal reflux disease)   . Arthritis     "back"  . Chronic lower back pain   . Skin change     12-24-14 few scalp lesions burned off"old age spots"  . Anemia     occ. injections for this at [facility]  . Pacemaker     [employer]    Past Surgical History  Procedure Laterality Date  . Hernia repair  ~ 2010;     lap; VHR  . Hernia repair  ~ [id number]    lap vh complicated by recurrence  . Hernia repair  09/2011    open; VHR  . Insert / replace / remove pacemaker  ~ 2009    initial placement  . Cataract extraction w/ intraocular lens  implant, bilateral    . Joint replacement    . Total knee arthroplasty  1990's    right  . I&d extremity Left 10/20/2012    Procedure: LEFT LEG IRRIGATION AND DEBRIDEMENT, AND WOUND VAC APPLICATION;  Surgeon: Marianna Payment, MD;  [facility];  Service: Orthopedics;  Laterality: Left;  . I&d extremity Left 10/22/2012    Procedure: IRRIGATION AND DEBRIDEMENT EXTREMITY;  Surgeon: Marianna Payment, MD;  [facility];  Service: Orthopedics;  Laterality: Left;  . Skin split graft Left 10/22/2012    Procedure: SKIN GRAFT SPLIT THICKNESS;  Surgeon: Marianna Payment, MD;  [facility];  Service: Orthopedics;  Laterality: Left;  . Carpal tunnel release Left 11/14/2014    Procedure:  CARPAL TUNNEL RELEASE LEFT THUMB;  Surgeon: Daryll Brod, MD;  Location: [place];  Service: Orthopedics;  Laterality: Left;  ANESTHESIA:  IV REGIONAL FAB  . Esophagogastroduodenoscopy (egd) with propofol N/A 01/14/2015    Procedure: ESOPHAGOGASTRODUODENOSCOPY (EGD) WITH PROPOFOL;  Surgeon: Garlan Fair, MD;  [facility];  Service: Endoscopy;  Laterality: N/A;  . Balloon dilation N/A 01/14/2015    Procedure: BALLOON DILATION;  Surgeon: Garlan Fair, MD;  [facility];  Service: Endoscopy;  Laterality: N/A;  . Cardiac catheterization    . Eye surgery Bilateral     cataract  . Cholecystectomy  10/23/2011  . Cholecystectomy  10/23/2011    Procedure: CHOLECYSTECTOMY;  Surgeon: Rolm Bookbinder, MD;  Location: [place];  Service: General;  Laterality: N/A;  with intraoperative cholangiogram    Family History  Problem Relation Age of Onset  . Heart disease Father   . Heart disease Mother   . Colon cancer Mother   . Heart attack Neg Hx   . Hypertension Neg Hx   . Stroke Mother    Social History:  reports that he quit smoking about 42 years ago. His smoking use included Cigarettes.  He has a 52.5 pack-year smoking history. He has never used smokeless tobacco. He reports that he does not drink alcohol or use illicit drugs.  Allergies: No Known Allergies  Medications Prior to Admission  Medication Sig Dispense Refill  . aspirin EC 81 MG tablet Take 81 mg by mouth daily.    . Calcium 600-200 MG-UNIT per tablet Take 1 tablet by mouth 2 (two) times daily.     . cholecalciferol (VITAMIN D) 1000 units tablet Take 1,000 Units by mouth daily.    . darbepoetin alfa-polysorbate (ARANESP, ALB FREE, SURECLICK) XX123456 MCG/ML injection Inject 500 mcg into the skin See admin instructions. Every 2 months    . furosemide (LASIX) 40 MG tablet Take 40-80 mg by mouth See admin instructions. Take 40mg  daily.  May take an additional 40mg  for leg swelling    . lisinopril  (PRINIVIL,ZESTRIL) 20 MG tablet Take 10 mg by mouth 2 (two) times daily.     . Multiple Vitamins-Minerals (PRESERVISION AREDS PO) Take 1 tablet by mouth 2 (two) times daily.    . Omega-3 Fatty Acids (FISH OIL) 1200 MG CAPS Take 1 capsule by mouth daily.      Marland Kitchen omeprazole (PRILOSEC) 20 MG capsule Take 20 mg by mouth daily.     Marland Kitchen oxybutynin (DITROPAN XL) 15 MG 24 hr tablet Take 15 mg by mouth every other day.     . oxyCODONE-acetaminophen (PERCOCET/ROXICET) 5-325 MG per tablet Take 1 tablet by mouth every 6 (six) hours as needed for moderate pain or severe pain.    Marland Kitchen Umeclidinium-Vilanterol (ANORO ELLIPTA) 62.5-25 MCG/INH AEPB Inhale 1 puff into the lungs daily.     . vitamin B-12 (CYANOCOBALAMIN) 500 MCG tablet Take 500 mcg by mouth daily.       Results for orders placed or performed during the hospital encounter of 03/29/15 (from the past 48 hour(s))  CBC     Status: Abnormal   Collection Time: 03/29/15  7:19 AM  Result Value Ref Range   WBC 5.6 4.0 - 10.5 K/uL   RBC 2.69 (L) 4.22 - 5.81 MIL/uL   Hemoglobin 8.1 (L) 13.0 - 17.0 g/dL   HCT 25.6 (L) 39.0 - 52.0 %   MCV 95.2 78.0 - 100.0 fL   MCH 30.1 26.0 - 34.0 pg   MCHC 31.6 30.0 - 36.0 g/dL   RDW 14.3 11.5 - 15.5 %   Platelets 206 150 - 400 K/uL   No results found.  Review of Systems  Constitutional: Negative.   Respiratory: Negative.   Genitourinary: Negative.   Skin:       Bruised throughout left and right arm  Endo/Heme/Allergies:       H/o anemia  Psychiatric/Behavioral: Negative.     Blood pressure 167/54, pulse 62, temperature 98.5 F (36.9 C), temperature source Oral, resp. rate 16, SpO2 99 %. Physical Exam left comminuted elbow fracture NVI - bruised throughout The patient is alert and oriented in no acute distress. The patient complains of pain in the affected upper extremity.  The patient is noted to have a normal HEENT exam. Lung fields show equal chest expansion and no shortness of breath. Abdomen exam is  nontender without distention. Lower extremity examination does not show any fracture dislocation or blood clot symptoms. Pelvis is stable and the neck and back are stable and nontender.  Assessment/Plan Plan left elbow reconstruction as outlined  We are planning surgery for your upper extremity. The risk and benefits of surgery to include risk of bleeding,  infection, anesthesia,  damage to normal structures and failure of the surgery to accomplish its intended goals of relieving symptoms and restoring function have been discussed in detail. With this in mind we plan to proceed. I have specifically discussed with the patient the pre-and postoperative regime and the dos and don'ts and risk and benefits in great detail. Risk and benefits of surgery also include risk of dystrophy(CRPS), chronic nerve pain, failure of the healing process to go onto completion and other inherent risks of surgery The relavent the pathophysiology of the disease/injury process, as well as the alternatives for treatment and postoperative course of action has been discussed in great detail with the patient who desires to proceed.  We will do everything in our power to help you (the patient) restore function to the upper extremity. It is a pleasure to see this patient today.   Paulene Floor, MD 03/29/2015, 7:33 AM

## 2015-03-29 NOTE — Anesthesia Procedure Notes (Addendum)
Anesthesia Regional Block:  Axillary brachial plexus block  Pre-Anesthetic Checklist: ,, timeout performed, Correct Patient, Correct Site, Correct Laterality, Correct Procedure, Correct Position, site marked, Risks and benefits discussed,  Surgical consent,  Pre-op evaluation,  At surgeon's request and post-op pain management  Laterality: Left  Prep: chloraprep       Needles:  Injection technique: Single-shot  Needle Type: Echogenic Stimulator Needle     Needle Length: 9cm 9 cm Needle Gauge: 21 and 21 G    Additional Needles: Axillary brachial plexus block Narrative:  Start time: 03/29/2015 7:30 AM End time: 03/29/2015 7:35 AM Injection made incrementally with aspirations every 5 mL.  Performed by: Personally   Additional Notes: 30 cc 0.5% Bupivacaine with 1:200 Epi injected easily   Procedure Name: Intubation Date/Time: 03/29/2015 7:58 AM Performed by: Clearnce Sorrel Pre-anesthesia Checklist: Patient identified, Timeout performed, Emergency Drugs available, Suction available and Patient being monitored Patient Re-evaluated:Patient Re-evaluated prior to inductionOxygen Delivery Method: Circle system utilized Preoxygenation: Pre-oxygenation with 100% oxygen Intubation Type: IV induction Ventilation: Mask ventilation without difficulty Laryngoscope Size: Mac and 3 Grade View: Grade I Tube type: Oral Laser Tube: Cuffed inflated with minimal occlusive pressure - saline Tube size: 7.5 mm Number of attempts: 1 Airway Equipment and Method: Stylet Placement Confirmation: ETT inserted through vocal cords under direct vision,  breath sounds checked- equal and bilateral and positive ETCO2 Secured at: 23 cm Tube secured with: Tape Dental Injury: Teeth and Oropharynx as per pre-operative assessment

## 2015-03-29 NOTE — Op Note (Signed)
See dictation# [id number] Amedeo Plenty MD

## 2015-03-29 NOTE — Care Management (Signed)
Utilization review completed. Makyle Eslick, RN Case Manager [phone]. 

## 2015-03-29 NOTE — Progress Notes (Signed)
Attestation not in [employer]. Spoke to patient and wife they report that they have spoken to Dr. Amedeo Plenty about procedure. Dr. Linna Caprice request to proceed with block to ensure optimal patient outcome. Spoke to patient and wife patient reports that he is ok with wife signing consent. Once Dr. Amedeo Plenty arrived wife signed consent form.

## 2015-03-30 ENCOUNTER — Inpatient Hospital Stay (HOSPITAL_COMMUNITY): Payer: Worker's Compensation

## 2015-03-30 DIAGNOSIS — Z950 Presence of cardiac pacemaker: Secondary | ICD-10-CM

## 2015-03-30 DIAGNOSIS — I10 Essential (primary) hypertension: Secondary | ICD-10-CM

## 2015-03-30 DIAGNOSIS — R0602 Shortness of breath: Secondary | ICD-10-CM | POA: Insufficient documentation

## 2015-03-30 DIAGNOSIS — I480 Paroxysmal atrial fibrillation: Secondary | ICD-10-CM

## 2015-03-30 DIAGNOSIS — I825Z1 Chronic embolism and thrombosis of unspecified deep veins of right distal lower extremity: Secondary | ICD-10-CM

## 2015-03-30 LAB — CBC WITH DIFFERENTIAL/PLATELET
BASOS PCT: 0 %
Basophils Absolute: 0 10*3/uL (ref 0.0–0.1)
Eosinophils Absolute: 0 10*3/uL (ref 0.0–0.7)
Eosinophils Relative: 0 %
HEMATOCRIT: 26.9 % — AB (ref 39.0–52.0)
Hemoglobin: 8.5 g/dL — ABNORMAL LOW (ref 13.0–17.0)
Lymphocytes Relative: 11 %
Lymphs Abs: 0.9 10*3/uL (ref 0.7–4.0)
MCH: 29.6 pg (ref 26.0–34.0)
MCHC: 31.6 g/dL (ref 30.0–36.0)
MCV: 93.7 fL (ref 78.0–100.0)
MONO ABS: 0.4 10*3/uL (ref 0.1–1.0)
MONOS PCT: 5 %
NEUTROS ABS: 7 10*3/uL (ref 1.7–7.7)
Neutrophils Relative %: 84 %
Platelets: 214 10*3/uL (ref 150–400)
RBC: 2.87 MIL/uL — ABNORMAL LOW (ref 4.22–5.81)
RDW: 14.5 % (ref 11.5–15.5)
WBC: 8.3 10*3/uL (ref 4.0–10.5)

## 2015-03-30 LAB — URINALYSIS, ROUTINE W REFLEX MICROSCOPIC
BILIRUBIN URINE: NEGATIVE
GLUCOSE, UA: NEGATIVE mg/dL
HGB URINE DIPSTICK: NEGATIVE
KETONES UR: NEGATIVE mg/dL
Leukocytes, UA: NEGATIVE
NITRITE: NEGATIVE
PH: 5.5 (ref 5.0–8.0)
Protein, ur: NEGATIVE mg/dL
Specific Gravity, Urine: 1.019 (ref 1.005–1.030)

## 2015-03-30 LAB — BASIC METABOLIC PANEL
Anion gap: 8 (ref 5–15)
BUN: 18 mg/dL (ref 6–20)
CALCIUM: 8.5 mg/dL — AB (ref 8.9–10.3)
CO2: 26 mmol/L (ref 22–32)
CREATININE: 1.02 mg/dL (ref 0.61–1.24)
Chloride: 103 mmol/L (ref 101–111)
GFR calc non Af Amer: >60 mL/min (ref >60)
GLUCOSE: 122 mg/dL — AB (ref 65–99)
Potassium: 4 mmol/L (ref 3.5–5.1)
Sodium: 137 mmol/L (ref 135–145)

## 2015-03-30 MED ORDER — HEPARIN SODIUM (PORCINE) 5000 UNIT/ML IJ SOLN
5000.0000 [IU] | Freq: Three times a day (TID) | INTRAMUSCULAR | Status: DC
  Administered 2015-03-30 – 2015-04-02 (×9): 5000 [IU] via SUBCUTANEOUS
  Filled 2015-03-30 (×9): qty 1

## 2015-03-30 MED ORDER — FERROUS SULFATE 325 (65 FE) MG PO TABS
325.0000 mg | ORAL_TABLET | Freq: Two times a day (BID) | ORAL | Status: DC
  Administered 2015-03-30 – 2015-04-02 (×6): 325 mg via ORAL
  Filled 2015-03-30 (×6): qty 1

## 2015-03-30 NOTE — Evaluation (Signed)
Occupational Therapy Evaluation Patient Details Name: [name] [name] MRN: [id number] DOB: 22-May-1924 Today's Date: 03/30/2015    History of Present Illness Pt is an 82 y.o. male s/p LEFT ELBOW OPEN REDUCTION INTERNAL FIXATION (ORIF) DISTAL HUMERUS FRACTURE WITH OLECRANON OSTEOTOMY AND ULNAR NERVE RELEASE (Left). PMH: HTN, hearing loss, COPD, OSA, prostate cancer, Exertional dyspnea, pacemaker, anemia, Chronic lower back pain; R TKA, L carpal tunnel release.   Clinical Impression   Per son report; pt is a very high fall risk prior to L UE injury. Pt demonstrating decreased safety awareness and significant decrease in insight to current deficits. Pt is a poor historian when providing home setup and PLOF; son reports that pt has significant decrease in mobility since LUE injury occurred, mainly staying in lift chair with very limited functional mobility in the home. Pt has required assist with bathing and dressing from his wife PTA. Functional mobility was not attempted at this time due to pts decreased safety awareness and only +1 assist at this time; will follow up for further evaluation of pts current level of functional mobility this afternoon with PT. Educated on finger ROM, retrograde massage, and elevation for edema control. At this time, recommending SNF for further rehab prior to return home.     Follow Up Recommendations  SNF;Supervision/Assistance - 24 hour    Equipment Recommendations  Other (comment) (TBD)    Recommendations for Other Services PT consult     Precautions / Restrictions Precautions Precautions: Fall Required Braces or Orthoses: Other Brace/Splint (sling) Restrictions Weight Bearing Restrictions: Yes LUE Weight Bearing: Non weight bearing Other Position/Activity Restrictions: no orders but maintainined NWB      Mobility Bed Mobility               General bed mobility comments: Not assessed at this time.  Transfers                 General  transfer comment: Not assessed at this time.    Balance Overall balance assessment: Needs assistance;History of Falls                                          ADL Overall ADL's : Needs assistance/impaired                                       General ADL Comments: Unable to attempt mobility at this time with +1 assist; will check back later with +2 help. Anticipate pt would need max assist for ADLs at this time. Educated pt and son on finger ROM, retrograde massage, and elevation for edema control.     Vision     Perception     Praxis      Pertinent Vitals/Pain Pain Assessment: No/denies pain     Hand Dominance Right   Extremity/Trunk Assessment Upper Extremity Assessment Upper Extremity Assessment: LUE deficits/detail LUE Deficits / Details: limited finger ROM secondary to edema LUE: Unable to fully assess due to immobilization LUE Sensation: history of peripheral neuropathy LUE Coordination: decreased fine motor;decreased gross motor   Lower Extremity Assessment Lower Extremity Assessment: Defer to PT evaluation   Cervical / Trunk Assessment Cervical / Trunk Assessment:  (chronic back pain)   Communication Communication Communication: HOH   Cognition Arousal/Alertness: Awake/alert Behavior During Therapy: Flat affect Overall Cognitive Status:  History of cognitive impairments - at baseline (decreased safety awareness and insight to deficits)       Memory: Decreased recall of precautions             General Comments       Exercises Exercises: Other exercises Other Exercises Other Exercises: Educated on finger ROM, retrograde massage, and elevation for edema control.   Shoulder Instructions      Home Living Family/patient expects to be discharged to:: Private residence Living Arrangements: Spouse/significant other Available Help at Discharge: Family;Available 24 hours/day Type of Home: House Home Access: Stairs to  enter;Ramped entrance     Home Layout: One level     Bathroom Shower/Tub: Occupational psychologist: Handicapped height     Home Equipment: Kay - single point;Bedside commode;Shower seat - built in          Prior Functioning/Environment Level of Independence: Needs assistance  Gait / Transfers Assistance Needed: Son reports that since fall pt has been basically "living in his lift chair". Uses cane for occasional mobility around the house but continues to have difficulty with mobility even using cane. ADL's / Homemaking Assistance Needed: Wife assists with bathing and dressing.        OT Diagnosis: Generalized weakness;Acute pain;Cognitive deficits   OT Problem List: Decreased strength;Decreased range of motion;Decreased activity tolerance;Impaired balance (sitting and/or standing);Decreased cognition;Decreased safety awareness;Decreased knowledge of use of DME or AE;Decreased knowledge of precautions;Impaired UE functional use;Pain;Increased edema   OT Treatment/Interventions: Self-care/ADL training;Therapeutic exercise;Energy conservation;DME and/or AE instruction;Therapeutic activities;Patient/family education;Balance training    OT Goals(Current goals can be found in the care plan section) Acute Rehab OT Goals Patient Stated Goal: none stated OT Goal Formulation: With patient/family Time For Goal Achievement: 04/13/15 Potential to Achieve Goals: Fair ADL Goals Additional ADL Goal #1: Pt will independently demonstrate 3 edema control techniques.  OT Frequency: Min 2X/week   Barriers to D/C:            Co-evaluation              End of Session Equipment Utilized During Treatment: Other (comment) (sling)  Activity Tolerance: Patient tolerated treatment well Patient left: in bed;with call bell/phone within reach;with family/visitor present   Time: [id number] OT Time Calculation (min): 18 min Charges:  OT General Charges $OT Visit: 1 Procedure OT  Evaluation $OT Eval Moderate Complexity: 1 Procedure G-Codes:     Binnie Kand M.S., OTR/L Pager: [phone]  03/30/2015, 11:05 AM

## 2015-03-30 NOTE — Op Note (Signed)
NAME:  [name] [name], [name] [name] NO.:  [id number]  MEDICAL RECORD NO.:  [id number]  LOCATION:  [facility]                         FACILITY:  [facility]  PHYSICIAN:  Satira Anis. Mitsy Owen, M.D.DATE OF BIRTH:  1925/01/04  DATE OF PROCEDURE:  03/29/2015 DATE OF DISCHARGE:                              OPERATIVE REPORT   PREOPERATIVE DIAGNOSIS:  Comminuted complex intra-articular distal humerus fracture, left upper extremity.  POSTOPERATIVE DIAGNOSIS:  Comminuted complex intra-articular distal humerus fracture, left upper extremity.  PROCEDURE: 1. Open reduction internal fixation of distal humerus fracture,     comminuted intra-articular in a low-T condylar type pattern. 2. Ulnar nerve decompression and anterior transposition. 3. Olecranon osteotomy with fixation utilizing tension-band wire     construct. 4. Stress radiograph (AP, lateral, and oblique x-rays performed and     examined, interpreted by myself) of the left elbow. 5. A 3.5 cm skin tear repair, simple repair in nature.  SURGEON:  Satira Anis. Amedeo Plenty, M.D.  ASSISTANT:  Avelina Laine, P.A.-C.  ANESTHESIA:  General preoperative block.  TOURNIQUET TIME:  Less than 90 minutes.  DRAINS:  One.  INDICATIONS:  The patient is an 82 year old male who presents by missed diagnosis.  I have counseled him in regard to risks and benefits of surgery including risk of infection, bleeding, anesthesia, and damage to normal structures, and failure of surgery to accomplish its intended goals of relieving symptoms and restoring function.  With this in mind, he desires to proceed.  He was injured on the job.  Unfortunately, he does have some weakness in his legs and according to he and his wife, occasionally uses an ambulatory aid.  I have actually watched him ambulate and do feel that his triceps push-off, and his balance issues are really dependent on his upper extremities.  I have discussed these issues with him at length and my concern  is that a total elbow arthroplasty as a primary procedure would likely wear out given his issues and his ambulatory capabilities. Suffice it to say, I do feel that an ORIF in trying to maintain the bone he has with healing would be a better option for him than a total elbow arthroplasty, which may be fraught with complications as I do not feel he is going to be able to protect his total elbow if an arthroplasty is performed.  We talked about this at length and settled on attempting the open reduction internal fixation.  He has massively swollen elbow and arm.  We waited a while for the swelling to go down and seek work comp approval.  His skin is very tenuous.  He is 12 and has a fragile skin parameters.  I reviewed this with him at length and the findings.  OPERATIVE PROCEDURE IN DETAIL:  The patient was seen by myself in Anesthesia.  He was given a preoperative block.  Preoperative Ancef was given in the form of 2 g, and he was taken to the operative theater where he underwent a general anesthetic.  He was laid in a sloppy lateral position with axillary roll placed and body parts well padded. Following this, the arm was prepped with the prescrub of Hibiclens followed by Betadine scrub and paint.  The patient has small skin tear on his forearm.  This was addressed at the conclusion of the case.  The patient had a small area of abrasion over the tip of the elbow, and I simply excised this with the initial approach.  The patient had the operation commenced with sterile field being secured, time-out being called, and an incision, utilitarian posterior in nature, accomplished.  The patient tolerated this well.  A utilitarian incision was made.  Skin flaps were elevated.  I very carefully handled the skin tissues, it was fragile.  I excised the abrasion over the tip of the olecranon.  Skin flaps were elevated. Following this, the ulnar nerve underwent a decompression and  subsequent anterior transposition.  Following this, I released the arcade of Struthers, medial intermuscular septum which was excised, 2 heads of the FCU, Osborne ligament, and the cubital tunnel.  The patient tolerated this well.  I very carefully lifted the ulnar nerve out of its bed, and there were no complicating features.  Following this, the patient then underwent a very careful and cautious approach to the extremity with a guide pin being placed in the olecranon after incision was made of the tip of the olecranon.  This was measured, reamed and following this, an osteotomy of the olecranon was accomplished followed by lifting this area back and exposing the distal humerus and the comminuted nature of the fragments.  I took a strip of the fascia off the anconeus for later coverage over the hardware.  Following this, we then irrigated and reassembled the distal humerus combination of curettage irrigation, suction, and orthopedic instruments including Freer elevator were used to very carefully and cautiously manipulate the fragments back into place.  We then used a combination of 4 Biomet variable compression screws for fixation.  We put the 2 screws together as well as provided fixation with the trochlea and capitellum. This was a low T-condylar fracture, once again very difficult to manage, but we were able to get good fixation and good stability on the table. I was quite pleased with this.  Given the comminuted nature, hopefully he will heal nicely we did pack stay graft in the defect.  Following this, AP lateral and oblique x-rays were performed, examined, and interpreted by myself, and looked to be excellent.  I was quite pleased with the screw lengths in all portions of the ORIF.  Following this, we irrigated and then took measures to reassemble the olecranon osteotomy.  The olecranon osteotomy was reassembled with a screw 6.5 in nature followed by a cable tension-band  construct.  The patient tolerated this well.  There were no complicating features.  The cable band construct was tightened according to standard technique.  Thus, ORIF of the distal humerus fracture low T-condylar in nature, comminuted in nature, was accomplished with multiple compression screws. A plate would not provide any significant additional help or torque force elimination in my estimation.  He also underwent olecranon osteotomy, and this was repaired without difficulty.  Following this, we then placed the ulnar nerve in an anteriorly transposed position where it was nice and tension free.  The patient had full range of motion, no complicating features and the final x-rays looked excellent on AP lateral and oblique views, was performed, examined, and interpreted by myself.  We then closed the wound in layers over deep Hemovac drain.  Prior to subcu closure, we made sure that the hardware was nicely covered and actually took great measures in the preoperative planning  and intraoperative exposure to do so.  This was tacked down (that is the fascia which we elevated) with 3-0 Vicryl. Subcu was closed with 3-0 Vicryl, skin edge with 3-0 Prolene.  Drain was hooked up to suction.  The patient had a 3.5 cm skin tear, which was repaired with chromic suture about the forearm.  This was irrigated and debrided.  We dressed him with Mepitel, Adaptic Neosporin gauze, ABDs, Kerlix, Webril and a posterior fiberglass splint.  He tolerated this well, and there were no complicating features.  Once this was complete, he was then taken to recovery room.  We will ask him to be monitored closely. We have asked the hospitalist to be engaged in his care as well.  His hematocrit was low preoperatively, and I discussed this with his wife as he the folks at a cancer center for this.  If his hematocrit dips below critical levels tomorrow, I would be quick to transfuse him, and the family understands  and concurs.  Going forward, given his balance issues, we are going to have Therapy look at him and certainly he will need assisted aid with ambulation and possibly further therapy and/or other directions as his progress dictates.  It was a pleasure to see him today and participate in his care plan.  We look forward to see him in the postop recovery.  We will keep him still for 2 weeks.  At 2 weeks, I will go ahead and place him in a splint in the office.  We will begin some very gentle well arm assisted range of motion.  He will need 2 heel-bow pads minimum to make sure that he does not place any pressure on his skin, which is highly fragile.  The most important aspect of his care is to not develop a pressure ulcer or skin breakdown after this, and this will be paramount in the therapy approach postoperatively.  These notes have been discussed and all questions have been encouraged and answered.     Satira Anis. Amedeo Plenty, M.D.     [facility]  D:  03/29/2015  T:  03/29/2015  Job:  [id number]

## 2015-03-30 NOTE — Clinical Social Work Placement (Addendum)
   CLINICAL SOCIAL WORK PLACEMENT  NOTE  Date:  03/30/2015  Patient Details  Name: [name] [name] MRN: [id number] Date of Birth: [birth year]  Clinical Social Work is seeking post-discharge placement for this patient at the [place] level of care (*CSW will initial, date and re-position this form in  chart as items are completed):  Yes   Patient/family provided with [place] Work Department's list of facilities offering this level of care within the geographic area requested by the patient (or if unable, by the patient's family).  Yes   Patient/family informed of their freedom to choose among providers that offer the needed level of care, that participate in Medicare, Medicaid or managed care program needed by the patient, have an available bed and are willing to accept the patient.  Yes   Patient/family informed of [facility]'s ownership interest in [facility] and [facility], as well as of the fact that they are under no obligation to receive care at these facilities.  PASRR submitted to EDS on 03/30/15     PASRR number received on 03/30/15     Existing PASRR number confirmed on       FL2 transmitted to all facilities in geographic area requested by pt/family on  03/30/15   Evette Cristal, MSW, [place], updated 04/02/15)  FL2 transmitted to all facilities within larger geographic area on       Patient informed that his/her managed care company has contracts with or will negotiate with certain facilities, including the following:         03/31/15   Patient/family informed of bed offers received. Evette Cristal, MSW, [place], updated 04/02/15)  Patient chooses bed at  [facility] Evette Cristal, MSW, [place], updated 04/02/15)    Physician recommends and patient chooses bed at      Patient to be transferred to  [facility] on  04/02/15 Evette Cristal, MSW, LCSWA, updated 04/02/15).  Patient to be transferred to facility by  [facility] Evette Cristal, MSW, LCSWA, updated 04/02/15)    Patient family notified on  04/02/15 of transfer Evette Cristal, MSW, [place], updated 04/02/15).  Name of family member notified:   Amore Sukhram patient's wife Evette Cristal, MSW, [place], updated 04/02/15)     PHYSICIAN       Additional Comment:    _______________________________________________ [name], LCSW 03/30/2015, 3:59 PM   Jones Broom. [place], MSW, [place] 04/02/2015 11:28 AM

## 2015-03-30 NOTE — Evaluation (Signed)
Physical Therapy Evaluation Patient Details Name: [name] [name] MRN: [id number] DOB: [birth year] Today's Date: 03/30/2015   History of Present Illness  Pt is an 82 y.o. male s/p LEFT ELBOW OPEN REDUCTION INTERNAL FIXATION (ORIF) DISTAL HUMERUS FRACTURE WITH OLECRANON OSTEOTOMY AND ULNAR NERVE RELEASE (Left). PMH: HTN, hearing loss, COPD, OSA, prostate cancer, Exertional dyspnea, pacemaker, anemia, Chronic lower back pain; R TKA, L carpal tunnel release.  Clinical Impression  Patient is s/p above surgery resulting in functional limitations due to the deficits listed below (see PT Problem List). [name] [name] has an extensive h/o falls and decreased awareness of his balance impairments.  Currently requires min +2 assist for safe transfer and +2 mod assist for short distance ambulation in room.  Pt will benefit from SNF at d/c. Patient will benefit from skilled PT to increase their independence and safety with mobility to allow discharge to the venue listed below.      Follow Up Recommendations SNF;Supervision for mobility/OOB    Equipment Recommendations  Other (comment) (TBD at next venue of care)    Recommendations for Other Services       Precautions / Restrictions Precautions Precautions: Fall Required Braces or Orthoses: Other Brace/Splint (sling) Restrictions Weight Bearing Restrictions: Yes LUE Weight Bearing: Non weight bearing Other Position/Activity Restrictions: MD confirmed NWB Lt UE      Mobility  Bed Mobility Overal bed mobility: Needs Assistance;+2 for physical assistance Bed Mobility: Supine to Sit     Supine to sit: Min assist;+2 for physical assistance;HOB elevated     General bed mobility comments: Pt declined attempting rolling and instead used bed rail and pushed on bed for supine>sit.  He required increased time and assist provided to trunk to boost up to sitting due to posterior lean.  Transfers Overall transfer level: Needs assistance Equipment used:  2 person hand held assist Transfers: Sit to/from Stand Sit to Stand: Min assist;+2 physical assistance         General transfer comment: Pt rocks and uses momentum to stand, first attempt was unable to power up off of bed.  On second attempt required min +2 assist to boost up to standing and to maintain stability throughout transfer.  Ambulation/Gait Ambulation/Gait assistance: Mod assist;+2 physical assistance Ambulation Distance (Feet): 20 Feet Assistive device: 2 person hand held assist Gait Pattern/deviations: Decreased stride length;Wide base of support;Trunk flexed;Antalgic;Staggering right;Staggering left;Shuffle   Gait velocity interpretation: <1.8 ft/sec, indicative of risk for recurrent falls General Gait Details: Pt shuffles Rt foot and requires cues to stand upright.  1 person HHA and OT providing assist w/ use of gait to remain steady as pt staggers Lt and Rt.  Stairs            Wheelchair Mobility    Modified Rankin (Stroke Patients Only)       Balance Overall balance assessment: Needs assistance;History of Falls (pt has extensive h/o falls) Sitting-balance support: Single extremity supported;Feet supported Sitting balance-Leahy Scale: Fair Sitting balance - Comments: Able to don socks sitting EOB but requires very close min guard assist   Standing balance support: Single extremity supported;During functional activity Standing balance-Leahy Scale: Poor Standing balance comment: 2 person assist to remain steady                             Pertinent Vitals/Pain Pain Assessment: No/denies pain    Home Living Family/patient expects to be discharged to:: Private residence Living Arrangements: Spouse/significant other Available Help at  Discharge: Family;Available 24 hours/day Type of Home: House Home Access: Stairs to enter;Ramped entrance     Home Layout: One level Home Equipment: Cane - single point;Bedside commode;Shower seat - built in       Prior Function Level of Independence: Needs assistance   Gait / Transfers Assistance Needed: Son reports that since fall pt has been basically "living in his lift chair". Uses cane for occasional mobility around the house but continues to have difficulty with mobility even using cane.  ADL's / Homemaking Assistance Needed: Wife assists with bathing and dressing.  Comments: Information above taken from OT note/conversation w/ OT     Hand Dominance   Dominant Hand: Right    Extremity/Trunk Assessment   Upper Extremity Assessment: Defer to OT evaluation       LUE Deficits / Details: limited finger ROM secondary to edema   Lower Extremity Assessment: RLE deficits/detail;LLE deficits/detail RLE Deficits / Details: Rt knee valgus, strength grossly 3/5 LLE Deficits / Details: strength grossly 3/5  Cervical / Trunk Assessment:  (chronic back pain)  Communication   Communication: HOH  Cognition Arousal/Alertness: Awake/alert Behavior During Therapy: Flat affect;Impulsive Overall Cognitive Status: History of cognitive impairments - at baseline (decreased safety awareness and insight to deficits) Area of Impairment: Safety/judgement     Memory: Decreased recall of precautions   Safety/Judgement: Decreased awareness of safety;Decreased awareness of deficits     General Comments: Pt continues to say, "I can do it" even when falling backward in bed during supine>sit    General Comments      Exercises General Exercises - Lower Extremity Ankle Circles/Pumps: AROM;Both;10 reps;Seated Other Exercises Other Exercises: Educated on finger ROM, retrograde massage, and elevation for edema control.      Assessment/Plan    PT Assessment Patient needs continued PT services  PT Diagnosis Difficulty walking;Abnormality of gait   PT Problem List Decreased strength;Decreased range of motion;Decreased activity tolerance;Decreased balance;Decreased mobility;Decreased  cognition;Decreased safety awareness;Decreased knowledge of use of DME;Pain;Decreased knowledge of precautions  PT Treatment Interventions DME instruction;Gait training;Stair training;Functional mobility training;Therapeutic activities;Therapeutic exercise;Balance training;Neuromuscular re-education;Patient/family education;Modalities   PT Goals (Current goals can be found in the Care Plan section) Acute Rehab PT Goals Patient Stated Goal: pt understanding and agreeable to SNF at d/c PT Goal Formulation: With patient Time For Goal Achievement: 04/11/15 Potential to Achieve Goals: Good    Frequency Min 3X/week   Barriers to discharge        Co-evaluation               End of Session Equipment Utilized During Treatment: Gait belt;Other (comment) (sling) Activity Tolerance: Patient limited by fatigue;Patient tolerated treatment well Patient left: in chair;with call bell/phone within reach;with chair alarm set;Other (comment) (w/ MD in room) Nurse Communication: Mobility status;Weight bearing status;Other (comment) (pt is impulsive)         Time: 1340-1403 PT Time Calculation (min) (ACUTE ONLY): 23 min   Charges:   PT Evaluation $PT Eval Moderate Complexity: 1 Procedure     PT G Codes:       Joslyn Hy PT, DPT [phone] Pager: [phone] 03/30/2015, 2:30 PM

## 2015-03-30 NOTE — Progress Notes (Signed)
Occupational Therapy Treatment Patient Details Name: [name] [name] MRN: [id number] DOB: 22-Sep-1924 Today's Date: 03/30/2015    History of present illness Pt is an 82 y.o. male s/p LEFT ELBOW OPEN REDUCTION INTERNAL FIXATION (ORIF) DISTAL HUMERUS FRACTURE WITH OLECRANON OSTEOTOMY AND ULNAR NERVE RELEASE (Left). PMH: HTN, hearing loss, COPD, OSA, prostate cancer, Exertional dyspnea, pacemaker, anemia, Chronic lower back pain; R TKA, L carpal tunnel release.   OT comments  Pt currently requiring mod +2 assist for functional mobility and overall mod assist for ADLs. Pt unable to recall edema control techniques taught this AM; reviewed all techniques with pt. Spoke with MD about SNF placement for further rehab upon d/c and he is in agreement. Will continue to follow acutely.    Follow Up Recommendations  SNF;Supervision/Assistance - 24 hour    Equipment Recommendations  Other (comment) (TBD at next venue)    Recommendations for Other Services      Precautions / Restrictions Precautions Precautions: Fall Required Braces or Orthoses: Other Brace/Splint (sling) Restrictions Weight Bearing Restrictions: Yes LUE Weight Bearing: Non weight bearing Other Position/Activity Restrictions: MD confirmed NWB Lt UE       Mobility Bed Mobility Overal bed mobility: Needs Assistance;+2 for physical assistance Bed Mobility: Supine to Sit     Supine to sit: Min assist;+2 for physical assistance;HOB elevated     General bed mobility comments: Pt declined attempting rolling and instead used bed rail and pushed on bed for supine>sit.  He required increased time and assist provided to trunk to boost up to sitting due to posterior lean.  Transfers Overall transfer level: Needs assistance Equipment used: 2 person hand held assist Transfers: Sit to/from Stand Sit to Stand: Min assist;+2 physical assistance         General transfer comment: Pt rocks and uses momentum to stand, first attempt was  unable to power up off of bed.  On second attempt required min +2 assist to boost up to standing and to maintain stability throughout transfer.    Balance Overall balance assessment: Needs assistance Sitting-balance support: Single extremity supported;Feet supported Sitting balance-Leahy Scale: Fair Sitting balance - Comments: Able to don socks sitting EOB but requires very close min guard assist   Standing balance support: Single extremity supported;During functional activity Standing balance-Leahy Scale: Poor Standing balance comment: 2 person assist to remain steady                   ADL Overall ADL's : Needs assistance/impaired Eating/Feeding: Set up;Sitting   Grooming: Supervision/safety;Sitting   Upper Body Bathing: Moderate assistance;Sitting   Lower Body Bathing: Moderate assistance;Sit to/from stand;+2 for physical assistance Lower Body Bathing Details (indicate cue type and reason): +2 for sit to stand Upper Body Dressing : Moderate assistance;Sitting   Lower Body Dressing: Moderate assistance;+2 for physical assistance;Sit to/from stand Lower Body Dressing Details (indicate cue type and reason): +2 for sit to stand. Pt able to reach R foot to assist with donning sock but required additional assistance to get sock pulled up on foot. Max assist to don L sock. Pt with strong posterior lean when attempting to don sock sitting EOB.  Toilet Transfer: Moderate assistance;+2 for physical assistance;Ambulation;BSC;RW (BSC over toilet) Toilet Transfer Details (indicate cue type and reason): simulated by transfer from EOB with ambulation to chair. Toileting- Clothing Manipulation and Hygiene: Moderate assistance;+2 for physical assistance;Sit to/from stand       Functional mobility during ADLs: Moderate assistance;+2 for physical assistance;Rolling walker General ADL Comments: No family present  during OT session. Reviewed finger ROM, elevation, and retrograde massage for edema  control; pt verbalized understanding but unsure of follow through.      Vision                     Perception     Praxis      Cognition   Behavior During Therapy: Flat affect;Impulsive Overall Cognitive Status: History of cognitive impairments - at baseline (decreased safety awareness and insight to deficits) Area of Impairment: Safety/judgement     Memory: Decreased recall of precautions    Safety/Judgement: Decreased awareness of safety;Decreased awareness of deficits     General Comments: Pt continues to say, "I can do it" even when falling backward in bed during supine>sit    Extremity/Trunk Assessment  Upper Extremity Assessment Upper Extremity Assessment: Defer to OT evaluation      Cervical / Trunk Assessment Cervical / Trunk Assessment:  (chronic back pain)    Exercises General Exercises - Lower Extremity Ankle Circles/Pumps: AROM;Both;10 reps;Seated Other Exercises Other Exercises: Educated on finger ROM, retrograde massage, and elevation for edema control.   Shoulder Instructions       General Comments      Pertinent Vitals/ Pain       Pain Assessment: No/denies pain  Home Living Family/patient expects to be discharged to:: Private residence Living Arrangements: Spouse/significant other Available Help at Discharge: Family;Available 24 hours/day Type of Home: House Home Access: Stairs to enter;Ramped entrance     Home Layout: One level     Bathroom Shower/Tub: Occupational psychologist: Handicapped height     Home Equipment: [place] - single point;Bedside commode;Shower seat - built in          Prior Functioning/Environment Level of Independence: Needs assistance  Gait / Transfers Assistance Needed: Son reports that since fall pt has been basically "living in his lift chair". Uses cane for occasional mobility around the house but continues to have difficulty with mobility even using cane. ADL's / Homemaking Assistance  Needed: Wife assists with bathing and dressing.   Comments: Information above taken from OT note/conversation w/ OT   Frequency Min 2X/week     Progress Toward Goals  OT Goals(current goals can now be found in the care plan section)  Progress towards OT goals: Not progressing toward goals - comment (Pt unable to recall edema control techniques)  Acute Rehab OT Goals Patient Stated Goal: pt understanding and agreeable to SNF at d/c OT Goal Formulation: With patient ADL Goals Pt Will Perform Grooming: with min assist;standing Pt Will Perform Upper Body Bathing: with min assist;sitting Pt Will Perform Lower Body Bathing: with min assist;sit to/from stand Pt Will Transfer to Toilet: with min assist;ambulating;with +2 assist;bedside commode (over toilet) Pt Will Perform Toileting - Clothing Manipulation and hygiene: with min assist;sit to/from stand  Plan Discharge plan remains appropriate    Co-evaluation    PT/OT/SLP Co-Evaluation/Treatment: Yes Reason for Co-Treatment: For patient/therapist safety   OT goals addressed during session: ADL's and self-care      End of Session Equipment Utilized During Treatment: Gait belt;Other (comment) (sling)   Activity Tolerance Patient tolerated treatment well   Patient Left in chair;with call bell/phone within reach;with chair alarm set   Nurse Communication          Time: [id number] OT Time Calculation (min): 25 min  Charges: OT General Charges $OT Visit: 1 Procedure OT Treatments $Therapeutic Activity: 8-22 mins  Binnie Kand M.S., OTR/L Pager: [phone]  03/30/2015, 3:29 PM

## 2015-03-30 NOTE — Progress Notes (Addendum)
Consult note                                           Patient Demographics:    [name] [name], is a 82 y.o. male, DOB - 01/12/1925, [id number]  Admit date - 03/29/2015   Admitting Physician Roseanne Kaufman, MD  Outpatient Primary MD for the patient is Mathews Argyle, MD  LOS - 1   No chief complaint on file.       Subjective:    [name] [name] today has, No headache, No chest pain, No abdominal pain - No Nausea, No new weakness tingling or numbness, No Cough - SOB.    Assessment  & Plan :     1. Fall related left elbow fracture, status post ORIF by Dr. Jeannie Fend on [id number], seems to have tolerated the procedure well. Minimal perioperative blood loss related anemia not requiring any transfusion. Add heparin for DVT prophylaxis further management defer to primary team. On cefazolin per primary team.  2. Anemia of chronic disease, iron deficiency and perioperative blood loss related anemia. Monitor H&H no need for transfusion. Lace on oral iron.  3. Paroxysmal atrial fibrillation with pacemaker and [place] Vasc 2 score of greater than 3. On aspirin at home, no rate controlling agents, monitor. No acute issues. Not on anticoagulation chronically, defer to primary care physician and primary cardiologist on long-term anticoagulation.  4. History of left lower leg DVT. Placed on heparin for DVT prophylaxis, on aspirin daily continue. Also on SCDs here.  5. Essential hypertension. Currently on ACE inhibitor and Lasix continued.  6. Low-grade fever morning of 03/30/2015. Chest x-ray and UA stable, it is added, no cough no shortness of breath or dysuria. Monitor. Likely reactive after surgery with possible mild  atelectasis.    DVT Prophylaxis  :   Heparin   Lab Results  Component Value Date   PLT 214 03/30/2015    Inpatient Medications  Scheduled Meds: . aspirin EC  81 mg Oral Daily  .  ceFAZolin (ANCEF) IV  1 g Intravenous 3 times per day  . cholecalciferol  1,000 Units Oral Daily  . furosemide  40 mg Oral Daily  . lisinopril  10 mg Oral BID  . oxybutynin  15 mg Oral QODAY  . pantoprazole  40 mg Oral Daily  . senna  1 tablet Oral BID  . Umeclidinium-Vilanterol  1 puff Inhalation Daily  . vitamin B-12  500 mcg Oral Daily   Continuous Infusions: . lactated ringers    . lactated ringers 75 mL/hr at 03/29/15 1339   PRN Meds:.ALPRAZolam, furosemide, magnesium citrate, morphine injection, ondansetron **OR** ondansetron (ZOFRAN) IV, oxyCODONE  Antibiotics  :     Anti-infectives  Start     Dose/Rate Route Frequency Ordered Stop   03/29/15 2000  ceFAZolin (ANCEF) IVPB 1 g/50 mL premix     1 g 100 mL/hr over 30 Minutes Intravenous 3 times per day 03/29/15 1130     03/29/15 1145  ceFAZolin (ANCEF) IVPB 1 g/50 mL premix     1 g 100 mL/hr over 30 Minutes Intravenous NOW 03/29/15 1130 03/29/15 1213   03/29/15 0700  ceFAZolin (ANCEF) IVPB 2 g/50 mL premix  Status:  Discontinued     2 g 100 mL/hr over 30 Minutes Intravenous To [facility] 03/28/15 1005 03/28/15 1633        Objective:   Filed Vitals:   03/29/15 1223 03/29/15 2053 03/29/15 2100 03/30/15 0427  BP: 152/53 159/58  112/46  Pulse: 63 68  88  Temp: 97.5 F (36.4 C) 99.2 F (37.3 C)  100.5 F (38.1 C)  TempSrc: Oral Oral  Oral  Resp: 14 16  16   Height:   5\' 10"  (1.778 m)   Weight:   81.7 kg (180 lb 1.9 oz)   SpO2: 100% 99%  90%    Wt Readings from Last 3 Encounters:  03/29/15 81.7 kg (180 lb 1.9 oz)  03/12/15 81.647 kg (180 lb)  01/14/15 82.101 kg (181 lb)     Intake/Output Summary (Last 24 hours) at 03/30/15 0942 Last data filed at 03/30/15 0837  Gross per 24 hour  Intake 1511.25 ml  Output    2125 ml  Net -613.75 ml     Physical Exam  Awake Alert, Oriented X 3, No new F.N deficits, Normal affect [place].AT,PERRAL Supple Neck,No JVD, No cervical lymphadenopathy appriciated.  Symmetrical Chest wall movement, Good air movement bilaterally, CTAB RRR,No Gallops,Rubs or new Murmurs, No Parasternal Heave +ve B.Sounds, Abd Soft, No tenderness, No organomegaly appriciated, No rebound - guarding or rigidity. No Cyanosis, Clubbing or edema, No new Rash or bruise, left elbow and cast and sling, drain in place    Data Review:   Micro Results No results found for this or any previous visit (from the past 240 hour(s)).  Radiology Reports Dg Lumbar Spine Complete  03/12/2015  CLINICAL DATA:  Pain following fall EXAM: LUMBAR SPINE - COMPLETE 4+ VIEW COMPARISON:  Lumbar spine CT March 29, 2014 FINDINGS: Frontal, lateral, spot lumbosacral lateral, and bilateral oblique views were obtained. The there are 5 non-rib-bearing lumbar type vertebral bodies. There is thoracolumbar dextroscoliosis with rotatory component. There is no acute fracture or spondylolisthesis. There is remodeling of the T12 and L1 vertebral bodies due to chronic scoliosis, stable. There is moderate disc narrowing at all levels with somewhat more severe disc space narrowing at T12-L1, L1-2, and L2-3. There are anterior osteophytes at all levels. There is facet osteoarthritic change at all levels bilaterally. There is atherosclerotic calcification in the aorta. IMPRESSION: Scoliosis with multilevel arthropathy. No acute fracture or spondylolisthesis. Atherosclerotic calcification in aorta. There is not appear to be significant interval change compared to prior CT. Electronically Signed   By: Lowella Grip III M.D.   On: 03/12/2015 17:46   Dg Elbow Complete Left  03/12/2015  CLINICAL DATA:  Status post fall.  Left elbow pain. EXAM: LEFT ELBOW - COMPLETE 3+ VIEW COMPARISON:  None. FINDINGS: There is a mildly displaced transcondylar  left elbow fracture with mild lateral displacement measuring 9 mm. There appears to be intra-articular extension of the fracture with a vertically oriented component. Soft tissues are unremarkable. IMPRESSION: Mildly displaced transcondylar left elbow fracture with  mild lateral displacement measuring 9 mm. There appears to be intra-articular extension of the fracture with a vertically oriented component. Electronically Signed   By: Kathreen Devoid   On: 03/12/2015 13:13   Dg Tibia/fibula Right  03/12/2015  CLINICAL DATA:  Pain following fall EXAM: RIGHT TIBIA AND FIBULA - 2 VIEW COMPARISON:  Right knee September 02, 2008 FINDINGS: Frontal and lateral views were obtained. The patient is status post total knee replacement with femoral and tibial prosthetic components appearing well-seated. There is a small joint effusion. There is bony overgrowth along the superior patella, stable. There is no acute fracture or dislocation. There is osteoarthritic change in the ankle joint. There are scattered small phleboliths. There is also arterial vascular calcification. IMPRESSION: Total knee replacement with prosthetic components in this region well-seated. Stable bony overgrowth along the superior patella. No acute fracture or dislocation. Mild osteoarthritic change in the ankle joint. Arterial vascular calcification noted. Electronically Signed   By: Lowella Grip III M.D.   On: 03/12/2015 17:41   Dg Chest Port 1 View  03/30/2015  CLINICAL DATA:  One day history of shortness of breath EXAM: PORTABLE CHEST 1 VIEW COMPARISON:  October 07, 2014 FINDINGS: There is no demonstrable edema or consolidation. Heart size and pulmonary vascularity are within normal limits. Pacemaker leads are attached to the right atrium and right ventricle. No adenopathy. There old healed rib fractures on the left and right sides. IMPRESSION: No edema or consolidation. Electronically Signed   By: Lowella Grip III M.D.   On: 03/30/2015 08:03   Dg  Shoulder Left  03/12/2015  CLINICAL DATA:  Left shoulder pain following a fall today. EXAM: LEFT SHOULDER - 2+ VIEW COMPARISON:  Left shoulder CT arthrogram dated 10/10/2007. FINDINGS: There is superior migration of the humeral head with glenohumeral joint space narrowing and inferior spur formation. No fracture or dislocation is seen. A left subclavian dual lead pacemaker is noted. IMPRESSION: 1. No fracture or dislocation. 2. Glenohumeral joint degenerative changes. 3. Superior migration of the humeral head compatible with a chronic rotator cuff tear. Electronically Signed   By: Claudie Revering M.D.   On: 03/12/2015 13:16   Dg Hip Unilat With Pelvis 2-3 Views Left  03/12/2015  CLINICAL DATA:  Pain following fall EXAM: DG HIP (WITH OR WITHOUT PELVIS) 2-3V LEFT COMPARISON:  None. FINDINGS: Frontal pelvis as well as frontal and lateral left hip images were obtained of. There is no demonstrable acute fracture or dislocation. There is mild symmetric narrowing of both hip joints. No erosive change. Postoperative change is noted in the lower pelvis. IMPRESSION: Mild symmetric narrowing both hip joints. No fracture or dislocation. Electronically Signed   By: Lowella Grip III M.D.   On: 03/12/2015 17:48     CBC  Recent Labs Lab 03/29/15 0719 03/30/15 0301  WBC 5.6 8.3  HGB 8.1* 8.5*  HCT 25.6* 26.9*  PLT 206 214  MCV 95.2 93.7  MCH 30.1 29.6  MCHC 31.6 31.6  RDW 14.3 14.5  LYMPHSABS  --  0.9  MONOABS  --  0.4  EOSABS  --  0.0  BASOSABS  --  0.0    Chemistries   Recent Labs Lab 03/29/15 0719 03/30/15 0301  NA 138 137  K 4.2 4.0  CL 105 103  CO2 23 26  GLUCOSE 111* 122*  BUN 28* 18  CREATININE 1.08 1.02  CALCIUM 9.1 8.5*   ------------------------------------------------------------------------------------------------------------------ No results for input(s): CHOL, HDL, LDLCALC, TRIG, CHOLHDL, LDLDIRECT in the last  72 hours.  No results found for:  HGBA1C ------------------------------------------------------------------------------------------------------------------ No results for input(s): TSH, T4TOTAL, T3FREE, THYROIDAB in the last 72 hours.  Invalid input(s): FREET3 ------------------------------------------------------------------------------------------------------------------ No results for input(s): VITAMINB12, FOLATE, FERRITIN, TIBC, IRON, RETICCTPCT in the last 72 hours.  Coagulation profile No results for input(s): INR, PROTIME in the last 168 hours.  No results for input(s): DDIMER in the last 72 hours.  Cardiac Enzymes No results for input(s): CKMB, TROPONINI, MYOGLOBIN in the last 168 hours.  Invalid input(s): CK ------------------------------------------------------------------------------------------------------------------ No results found for: BNP  Time Spent in minutes   35   Lala Lund K M.D on 03/30/2015 at 9:42 AM  Between 7am to 7pm - Pager - [phone]  After 7pm go to [email or web address] - password [facility]  [facility] -  Office  [phone]

## 2015-03-30 NOTE — Clinical Social Work Note (Signed)
Clinical Social Work Assessment  Patient Details  Name: [name] [name] MRN: [id number] Date of Birth: 1924/05/13  Date of referral:  03/30/15               Reason for consult:  Facility Placement                Permission sought to share information with:  Family Supports, Chartered certified accountant granted to share information::  Yes, Verbal Permission Granted  Name::     [name] [name]  Agency::  [facility] SNFs  Relationship::     Contact Information:  wife  Housing/Transportation Living arrangements for the past 2 months:  Export of Information:  Patient Patient Interpreter Needed:  None Criminal Activity/Legal Involvement Pertinent to Current Situation/Hospitalization:  No - Comment as needed Significant Relationships:  Adult Children, Spouse Lives with:  Spouse Do you feel safe going back to the place where you live?  Yes Need for family participation in patient care:  No (Coment)  Care giving concerns:  None, at this time.     Social Worker assessment / plan:  Met with Pt to discuss d/c plan.  Pt and SW discussed PT/OT recommendation and Pt agreed.  Pt stated that he feels that SNF is warranted and agreed to a county-wide search.  He is familiar with [facility] and [facility], although he has never been to a SNF before.  Employment status:  Retired Forensic scientist:    PT Recommendations:  [place] / Referral to community resources:   SNF list  Patient/Family's Response to care:  Pt seemed pleased with the level of care he's currently receiving.  Patient/Family's Understanding of and Emotional Response to Diagnosis, Current Treatment, and Prognosis:  Pt is in agreement with SNF and will discuss placement options with his wife, once presented with bed offers.  Emotional Assessment Appearance:  Appears stated age Attitude/Demeanor/Rapport:   (calm) Affect (typically observed):  Accepting Orientation:   Oriented to Self, Oriented to Place, Oriented to  Time, Oriented to Situation Alcohol / Substance use:  Never Used Psych involvement (Current and /or in the community):  No (Comment)  Discharge Needs  Concerns to be addressed:  No discharge needs identified Readmission within the last 30 days:  No Current discharge risk:  None Barriers to Discharge:  No Barriers Identified   [name], [place] 03/30/2015, 3:57 PM

## 2015-03-30 NOTE — Progress Notes (Signed)
PT Cancellation Note  Patient Details Name: [name] [name] MRN: [id number] DOB: March 31, 1924   Cancelled Treatment:    Reason Eval/Treat Not Completed: Patient declined, no reason specified.  Pt just refused OT w/ request for therapy to return after he finishes his breakfast.  PT will continue to follow acutely.  Joslyn Hy PT, DPT [phone] Pager: [phone] 03/30/2015, 9:47 AM

## 2015-03-30 NOTE — Progress Notes (Signed)
Subjective: 1 Day Post-Op Procedure(s) (LRB): LEFT ELBOW OPEN REDUCTION INTERNAL FIXATION (ORIF) DISTAL HUMERUS FRACTURE WITH OLECRANON OSTEOTOMY AND ULNAR NERVE RELEASE (Left) Patient reports pain as mild.   He is able to urinate. He is able to tolerate by mouth food.   Objective: Vital signs in last 24 hours: Temp:  [99.2 F (37.3 C)-100.5 F (38.1 C)] 100.5 F (38.1 C) (02/12 0427) Pulse Rate:  [68-88] 88 (02/12 0427) Resp:  [16] 16 (02/12 0427) BP: (112-159)/(46-58) 112/46 mmHg (02/12 0427) SpO2:  [90 %-99 %] 90 % (02/12 0427) Weight:  [81.7 kg (180 lb 1.9 oz)] 81.7 kg (180 lb 1.9 oz) (02/11 2100)  Intake/Output from previous day: 02/11 0701 - 02/12 0700 In: 1511.3 [P.O.:360; I.V.:1151.3] Out: 1925 [Urine:1800; Drains:100; Blood:25] Intake/Output this shift: Total I/O In: -  Out: 200 [Urine:200]   Recent Labs  03/29/15 0719 03/30/15 0301  HGB 8.1* 8.5*    Recent Labs  03/29/15 0719 03/30/15 0301  WBC 5.6 8.3  RBC 2.69* 2.87*  HCT 25.6* 26.9*  PLT 206 214    Recent Labs  03/29/15 0719 03/30/15 0301  NA 138 137  K 4.2 4.0  CL 105 103  CO2 23 26  BUN 28* 18  CREATININE 1.08 1.02  GLUCOSE 111* 122*  CALCIUM 9.1 8.5*   No results for input(s): LABPT, INR in the last 72 hours. Physical exam: He is alert and oriented. He is appropriate to time place and events. I removed his drain at bedside. I discussed his care with therapy and nursing staff. Neurologically intact ABD soft Neurovascular intact Sensation intact distally No cellulitis present Compartment soft  Assessment/Plan: 1 Day Post-Op Procedure(s) (LRB): LEFT ELBOW OPEN REDUCTION INTERNAL FIXATION (ORIF) DISTAL HUMERUS FRACTURE WITH OLECRANON OSTEOTOMY AND ULNAR NERVE RELEASE (Left) Advance diet Up with therapy   Overall is doing quite well there's no problematic features today. I'm pleased with his neurovascular status. He is nonweightbearing in his left upper extremity and I informed  physical therapy to this effect. We had a conversation in regards to his plans. I do feel that skilled nursing facility would be a good option for him. Both therapy and myself feel that he would benefit from skilled therapy. I feel that he is going to be fragile if he returns home in his current state.  Previously he had used his arms as definitive support for his legs as he has significant quadriceps weakness. Given the fact that he now is unable to use one of his arms he is very unsteady on his gait. This could affect him dramatically long-term and could increase his fall risk. Thus I do feel that skilled nursing facility will be the best option with supervised therapy throughout the stay. Had a long conversation with the family about this in and standing agree.  We will continue close observation.  He is stable.  Hematocrit looks good today  Tumeka Chimenti III,Caterra Ostroff M 03/30/2015, 2:21 PM

## 2015-03-30 NOTE — NC FL2 (Signed)
[place] LEVEL OF CARE SCREENING TOOL     IDENTIFICATION  Patient Name: [name] [name] Birthdate: 1924/11/23 Sex: male Admission Date (Current Location): 03/29/2015  [facility], [place] and [place] Number:  Herbalist and Address:  [facility] [place] [address], [place], [place] [address]      Provider Number: [id number]  Attending Physician Name and Address:  Thurnell Lose, MD  Relative Name and Phone Number:       Current Level of Care: Hospital Recommended Level of Care: [place] Prior Approval Number:    Date Approved/Denied:   PASRR Number: [id number]  Discharge Plan: SNF    Current Diagnoses: Patient Active Problem List   Diagnosis Date Noted  . SOB (shortness of breath)   . Left elbow fracture 03/29/2015  . DOE (dyspnea on exertion) 10/26/2013  . Essential hypertension, benign 10/26/2013  . 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 Cellulitis 10/17/2012  . Atrial fibrillation ([place]) 10/22/2011  . DVT, lower extremity, distal, chronic ([place]) 10/22/2011  . Pacemaker 10/19/2011  . Prostate cancer ([place]) 10/19/2011  . COPD (chronic obstructive pulmonary disease) with emphysema ([place]) 02/04/2011  . Anemia 12/10/2010    Orientation RESPIRATION BLADDER Height & Weight     Self, Time, Situation, Place  Normal Continent Weight: 180 lb 1.9 oz (81.7 kg) Height:  5\' 10"  (177.8 cm)  BEHAVIORAL SYMPTOMS/MOOD NEUROLOGICAL BOWEL NUTRITION STATUS      Continent    AMBULATORY STATUS COMMUNICATION OF NEEDS Skin   Limited Assist Verbally Surgical wounds                       Personal Care Assistance Level of Assistance  Bathing, Dressing Bathing Assistance: Limited assistance   Dressing Assistance: Limited assistance     Functional Limitations Info             SPECIAL CARE FACTORS FREQUENCY                       Contractures Contractures Info: Not present    Additional Factors  Info                  Current Medications (03/30/2015):  This is the current hospital active medication list Current Facility-Administered Medications  Medication Dose Route Frequency Provider Last Rate Last Dose  . ALPRAZolam Duanne Moron) tablet 0.5 mg  0.5 mg Oral Q6H PRN Avelina Laine, PA-C      . aspirin EC tablet 81 mg  81 mg Oral Daily Avelina Laine, PA-C   81 mg at 03/30/15 [id number]  . ceFAZolin (ANCEF) IVPB 1 g/50 mL premix  1 g Intravenous 3 times per day Avelina Laine, PA-C   1 g at 03/30/15 1506  . cholecalciferol (VITAMIN D) tablet 1,000 Units  1,000 Units Oral Daily Avelina Laine, PA-C   1,000 Units at 03/30/15 0929  . ferrous sulfate tablet 325 mg  325 mg Oral BID WC Thurnell Lose, MD      . furosemide (LASIX) tablet 40 mg  40 mg Oral Daily Avelina Laine, PA-C   40 mg at 03/30/15 [id number]  . furosemide (LASIX) tablet 40 mg  40 mg Oral Daily PRN Skeet Simmer, [facility]      . heparin injection 5,000 Units  5,000 Units Subcutaneous 3 times per day Thurnell Lose, MD   5,000 Units at 03/30/15 1506  . lactated ringers infusion   Intravenous Continuous Finis Bud,  MD      . lactated ringers infusion   Intravenous Continuous Avelina Laine, PA-C 75 mL/hr at 03/29/15 1339    . lisinopril (PRINIVIL,ZESTRIL) tablet 10 mg  10 mg Oral BID Avelina Laine, PA-C   10 mg at 03/30/15 0930  . magnesium citrate solution 1 Bottle  1 Bottle Oral Once PRN Avelina Laine, PA-C      . morphine 2 MG/ML injection 1 mg  1 mg Intravenous Q2H PRN Avelina Laine, PA-C      . ondansetron [facility]) injection 4 mg  4 mg Intravenous Q6H PRN Avelina Laine, PA-C      . oxybutynin (DITROPAN XL) 24 hr tablet 15 mg  15 mg Oral QODAY Avelina Laine, PA-C   15 mg at 03/30/15 0930  . oxyCODONE (Oxy IR/ROXICODONE) immediate release tablet 5-10 mg  5-10 mg Oral Q3H PRN Avelina Laine, PA-C   5 mg at 03/30/15 1506  . pantoprazole (PROTONIX) EC tablet 40 mg  40 mg Oral Daily Avelina Laine, PA-C   40 mg at 03/30/15 0930   . senna (SENOKOT) tablet 8.6 mg  1 tablet Oral BID Avelina Laine, PA-C   8.6 mg at 03/30/15 0931  . Umeclidinium-Vilanterol 62.5-25 MCG/INH AEPB 1 puff  1 puff Inhalation Daily Avelina Laine, PA-C   1 puff at 03/29/15 1341  . vitamin B-12 (CYANOCOBALAMIN) tablet 500 mcg  500 mcg Oral Daily Avelina Laine, PA-C   500 mcg at 03/30/15 [id number]     Discharge Medications: Please see discharge summary for a list of discharge medications.  Relevant Imaging Results:  Relevant Lab Results:   Additional Information    Moshe Cipro Berneice Heinrich, LCSW

## 2015-03-31 LAB — CBC WITH DIFFERENTIAL/PLATELET
BASOS ABS: 0 10*3/uL (ref 0.0–0.1)
BASOS PCT: 0 %
EOS PCT: 1 %
Eosinophils Absolute: 0.1 10*3/uL (ref 0.0–0.7)
HCT: 25.4 % — ABNORMAL LOW (ref 39.0–52.0)
Hemoglobin: 8 g/dL — ABNORMAL LOW (ref 13.0–17.0)
Lymphocytes Relative: 11 %
Lymphs Abs: 0.9 10*3/uL (ref 0.7–4.0)
MCH: 30 pg (ref 26.0–34.0)
MCHC: 31.5 g/dL (ref 30.0–36.0)
MCV: 95.1 fL (ref 78.0–100.0)
MONO ABS: 0.5 10*3/uL (ref 0.1–1.0)
Monocytes Relative: 7 %
NEUTROS ABS: 6.1 10*3/uL (ref 1.7–7.7)
Neutrophils Relative %: 81 %
PLATELETS: 164 10*3/uL (ref 150–400)
RBC: 2.67 MIL/uL — ABNORMAL LOW (ref 4.22–5.81)
RDW: 14.7 % (ref 11.5–15.5)
WBC: 7.5 10*3/uL (ref 4.0–10.5)

## 2015-03-31 LAB — URINE CULTURE

## 2015-03-31 LAB — BASIC METABOLIC PANEL
ANION GAP: 8 (ref 5–15)
BUN: 22 mg/dL — ABNORMAL HIGH (ref 6–20)
CALCIUM: 8.3 mg/dL — AB (ref 8.9–10.3)
CO2: 25 mmol/L (ref 22–32)
Chloride: 102 mmol/L (ref 101–111)
Creatinine, Ser: 1.08 mg/dL (ref 0.61–1.24)
GFR, EST NON AFRICAN AMERICAN: 59 mL/min — AB (ref >60)
Glucose, Bld: 111 mg/dL — ABNORMAL HIGH (ref 65–99)
Potassium: 3.7 mmol/L (ref 3.5–5.1)
Sodium: 135 mmol/L (ref 135–145)

## 2015-03-31 MED ORDER — SODIUM CHLORIDE 0.9 % IV BOLUS (SEPSIS)
500.0000 mL | Freq: Once | INTRAVENOUS | Status: AC
  Administered 2015-03-31: 500 mL via INTRAVENOUS

## 2015-03-31 NOTE — Progress Notes (Signed)
Patient Demographics:    [name] [name], is a 82 y.o. male, DOB - 04/10/1924, [id number]  Admit date - 03/29/2015   Admitting Physician Roseanne Kaufman, MD  Outpatient Primary MD for the patient is Mathews Argyle, MD  LOS - 2   No chief complaint on file.       Subjective:    [name] [name] today has, No headache, No chest pain, No abdominal pain - No Nausea, No new weakness tingling or numbness, No Cough - SOB.    Assessment  & Plan :     1. Fall related left elbow fracture, status post ORIF by Dr. Amedeo Plenty on 03/29/2015, seems to have tolerated the procedure well. Minimal perioperative blood loss related anemia not requiring any transfusion. Add heparin for DVT prophylaxis further management defer to primary team. On cefazolin per primary team.  2. Anemia of chronic disease, iron deficiency and perioperative blood loss related anemia. Monitor H&H no need for transfusion. Lace on oral iron.  3. Paroxysmal atrial fibrillation with pacemaker and [place] Vasc 2 score of greater than 3. On aspirin at home, no rate controlling agents, monitor. No acute issues. Not on anticoagulation chronically, defer to primary care physician and primary cardiologist on long-term anticoagulation.  4. History of left lower leg DVT. Placed on heparin for DVT prophylaxis, on aspirin daily continue. Also on SCDs here.  5. Essential hypertension. Currently on ACE inhibitor and Lasix continued.  6. Low-grade fever morning of 03/30/2015. Chest x-ray and UA stable, IS added, no cough no shortness of breath or dysuria. Monitor. Likely reactive after surgery with possible mild atelectasis. Fever now resolved.  Nontoxic appearance.    DVT Prophylaxis  :   Heparin   Disposition - SNF  Consult - Dr Amedeo Plenty  Procedure - ORIF L humerus 03-29-15    Lab Results  Component Value Date   PLT 164 03/31/2015    Inpatient Medications  Scheduled Meds: . aspirin EC  81 mg Oral Daily  .  ceFAZolin (ANCEF) IV  1 g Intravenous 3 times per day  . cholecalciferol  1,000 Units Oral Daily  . ferrous sulfate  325 mg Oral BID WC  . furosemide  40 mg Oral Daily  . heparin subcutaneous  5,000 Units Subcutaneous 3 times per day  . lisinopril  10 mg Oral BID  . oxybutynin  15 mg Oral QODAY  . pantoprazole  40 mg Oral Daily  . senna  1 tablet Oral BID  . Umeclidinium-Vilanterol  1 puff Inhalation Daily  . vitamin B-12  500 mcg Oral Daily  Continuous Infusions:   PRN Meds:.ALPRAZolam, furosemide, magnesium citrate, morphine injection, [DISCONTINUED] ondansetron **OR** ondansetron (ZOFRAN) IV, oxyCODONE  Antibiotics  :     Anti-infectives    Start     Dose/Rate Route Frequency Ordered Stop   03/29/15 2000  ceFAZolin (ANCEF) IVPB 1 g/50 mL premix     1 g 100 mL/hr over 30 Minutes Intravenous 3 times per day 03/29/15 1130     03/29/15 1145  ceFAZolin (ANCEF) IVPB 1 g/50 mL premix     1 g 100 mL/hr over 30 Minutes Intravenous NOW 03/29/15 1130 03/29/15 1213   03/29/15 0700  ceFAZolin (ANCEF) IVPB 2 g/50 mL premix  Status:  Discontinued     2 g 100 mL/hr over 30 Minutes Intravenous To [facility] 03/28/15 1005 03/28/15 1633        Objective:   Filed Vitals:   03/30/15 1300 03/30/15 2023 03/31/15 0423 03/31/15 0849  BP: 109/43 121/54 117/48 119/40  Pulse: 82 66 67 71  Temp: 99.3 F (37.4 C) 98.2 F (36.8 C) 99.8 F (37.7 C)   TempSrc: Oral Oral Oral   Resp: 16 16 16    Height:      Weight:      SpO2: 94% 95% 93%     Wt Readings from Last 3 Encounters:  03/29/15 81.7 kg (180 lb 1.9 oz)  03/12/15 81.647 kg (180 lb)  01/14/15 82.101 kg (181 lb)     Intake/Output Summary (Last  24 hours) at 03/31/15 1021 Last data filed at 03/31/15 0423  Gross per 24 hour  Intake    240 ml  Output    500 ml  Net   -260 ml     Physical Exam  Awake Alert, Oriented X 3, No new F.N deficits, Normal affect [place].AT,PERRAL Supple Neck,No JVD, No cervical lymphadenopathy appriciated.  Symmetrical Chest wall movement, Good air movement bilaterally, CTAB RRR,No Gallops,Rubs or new Murmurs, No Parasternal Heave +ve B.Sounds, Abd Soft, No tenderness, No organomegaly appriciated, No rebound - guarding or rigidity. No Cyanosis, Clubbing or edema, No new Rash or bruise, left elbow and cast and sling, drain in place, L hand swollen    Data Review:   Micro Results No results found for this or any previous visit (from the past 240 hour(s)).  Radiology Reports Dg Lumbar Spine Complete  03/12/2015  CLINICAL DATA:  Pain following fall EXAM: LUMBAR SPINE - COMPLETE 4+ VIEW COMPARISON:  Lumbar spine CT March 29, 2014 FINDINGS: Frontal, lateral, spot lumbosacral lateral, and bilateral oblique views were obtained. The there are 5 non-rib-bearing lumbar type vertebral bodies. There is thoracolumbar dextroscoliosis with rotatory component. There is no acute fracture or spondylolisthesis. There is remodeling of the T12 and L1 vertebral bodies due to chronic scoliosis, stable. There is moderate disc narrowing at all levels with somewhat more severe disc space narrowing at T12-L1, L1-2, and L2-3. There are anterior osteophytes at all levels. There is facet osteoarthritic change at all levels bilaterally. There is atherosclerotic calcification in the aorta. IMPRESSION: Scoliosis with multilevel arthropathy. No acute fracture or spondylolisthesis. Atherosclerotic calcification in aorta. There is not appear to be significant interval change compared to prior CT. Electronically Signed   By: Lowella Grip III M.D.   On: 03/12/2015 17:46   Dg Elbow Complete Left  03/12/2015  CLINICAL DATA:  Status post  fall.  Left elbow pain. EXAM: LEFT ELBOW - COMPLETE 3+ VIEW COMPARISON:  None. FINDINGS: There is a mildly displaced transcondylar left elbow fracture with mild  lateral displacement measuring 9 mm. There appears to be intra-articular extension of the fracture with a vertically oriented component. Soft tissues are unremarkable. IMPRESSION: Mildly displaced transcondylar left elbow fracture with mild lateral displacement measuring 9 mm. There appears to be intra-articular extension of the fracture with a vertically oriented component. Electronically Signed   By: Kathreen Devoid   On: 03/12/2015 13:13   Dg Tibia/fibula Right  03/12/2015  CLINICAL DATA:  Pain following fall EXAM: RIGHT TIBIA AND FIBULA - 2 VIEW COMPARISON:  Right knee September 02, 2008 FINDINGS: Frontal and lateral views were obtained. The patient is status post total knee replacement with femoral and tibial prosthetic components appearing well-seated. There is a small joint effusion. There is bony overgrowth along the superior patella, stable. There is no acute fracture or dislocation. There is osteoarthritic change in the ankle joint. There are scattered small phleboliths. There is also arterial vascular calcification. IMPRESSION: Total knee replacement with prosthetic components in this region well-seated. Stable bony overgrowth along the superior patella. No acute fracture or dislocation. Mild osteoarthritic change in the ankle joint. Arterial vascular calcification noted. Electronically Signed   By: Lowella Grip III M.D.   On: 03/12/2015 17:41   Dg Chest Port 1 View  03/30/2015  CLINICAL DATA:  One day history of shortness of breath EXAM: PORTABLE CHEST 1 VIEW COMPARISON:  October 07, 2014 FINDINGS: There is no demonstrable edema or consolidation. Heart size and pulmonary vascularity are within normal limits. Pacemaker leads are attached to the right atrium and right ventricle. No adenopathy. There old healed rib fractures on the left and right  sides. IMPRESSION: No edema or consolidation. Electronically Signed   By: Lowella Grip III M.D.   On: 03/30/2015 08:03   Dg Shoulder Left  03/12/2015  CLINICAL DATA:  Left shoulder pain following a fall today. EXAM: LEFT SHOULDER - 2+ VIEW COMPARISON:  Left shoulder CT arthrogram dated 10/10/2007. FINDINGS: There is superior migration of the humeral head with glenohumeral joint space narrowing and inferior spur formation. No fracture or dislocation is seen. A left subclavian dual lead pacemaker is noted. IMPRESSION: 1. No fracture or dislocation. 2. Glenohumeral joint degenerative changes. 3. Superior migration of the humeral head compatible with a chronic rotator cuff tear. Electronically Signed   By: Claudie Revering M.D.   On: 03/12/2015 13:16   Dg Hip Unilat With Pelvis 2-3 Views Left  03/12/2015  CLINICAL DATA:  Pain following fall EXAM: DG HIP (WITH OR WITHOUT PELVIS) 2-3V LEFT COMPARISON:  None. FINDINGS: Frontal pelvis as well as frontal and lateral left hip images were obtained of. There is no demonstrable acute fracture or dislocation. There is mild symmetric narrowing of both hip joints. No erosive change. Postoperative change is noted in the lower pelvis. IMPRESSION: Mild symmetric narrowing both hip joints. No fracture or dislocation. Electronically Signed   By: Lowella Grip III M.D.   On: 03/12/2015 17:48     CBC  Recent Labs Lab 03/29/15 0719 03/30/15 0301 03/31/15 0357  WBC 5.6 8.3 7.5  HGB 8.1* 8.5* 8.0*  HCT 25.6* 26.9* 25.4*  PLT 206 214 164  MCV 95.2 93.7 95.1  MCH 30.1 29.6 30.0  MCHC 31.6 31.6 31.5  RDW 14.3 14.5 14.7  LYMPHSABS  --  0.9 0.9  MONOABS  --  0.4 0.5  EOSABS  --  0.0 0.1  BASOSABS  --  0.0 0.0    Chemistries   Recent Labs Lab 03/29/15 0719 03/30/15 0301 03/31/15 0357  NA 138 137 135  K 4.2 4.0 3.7  CL 105 103 102  CO2 23 26 25   GLUCOSE 111* 122* 111*  BUN 28* 18 22*  CREATININE 1.08 1.02 1.08  CALCIUM 9.1 8.5* 8.3*    ------------------------------------------------------------------------------------------------------------------ No results for input(s): CHOL, HDL, LDLCALC, TRIG, CHOLHDL, LDLDIRECT in the last 72 hours.  No results found for: HGBA1C ------------------------------------------------------------------------------------------------------------------ No results for input(s): TSH, T4TOTAL, T3FREE, THYROIDAB in the last 72 hours.  Invalid input(s): FREET3 ------------------------------------------------------------------------------------------------------------------ No results for input(s): VITAMINB12, FOLATE, FERRITIN, TIBC, IRON, RETICCTPCT in the last 72 hours.  Coagulation profile No results for input(s): INR, PROTIME in the last 168 hours.  No results for input(s): DDIMER in the last 72 hours.  Cardiac Enzymes No results for input(s): CKMB, TROPONINI, MYOGLOBIN in the last 168 hours.  Invalid input(s): CK ------------------------------------------------------------------------------------------------------------------ No results found for: BNP  Time Spent in minutes   35   Lala Lund K M.D on 03/31/2015 at 10:21 AM  Between 7am to 7pm - Pager - [phone]  After 7pm go to [email or web address] - password [facility]  [facility] -  Office  [phone]

## 2015-03-31 NOTE — Progress Notes (Signed)
Subjective: 2 Days Post-Op Procedure(s) (LRB): LEFT ELBOW OPEN REDUCTION INTERNAL FIXATION (ORIF) DISTAL HUMERUS FRACTURE WITH OLECRANON OSTEOTOMY AND ULNAR NERVE RELEASE (Left) Patient reports pain as minimal this morning, he denies fever, chills, shortness of breath, chest pain. He has good understanding in regards to his current predicament and is open and willing to proceed to a rehabilitation facility once a bed is available.  Objective: Vital signs in last 24 hours: Temp:  [98.2 F (36.8 C)-99.8 F (37.7 C)] 99.8 F (37.7 C) (02/13 0423) Pulse Rate:  [66-82] 67 (02/13 0423) Resp:  [16] 16 (02/13 0423) BP: (109-121)/(43-54) 117/48 mmHg (02/13 0423) SpO2:  [93 %-95 %] 93 % (02/13 0423)  Intake/Output from previous day: 02/12 0701 - 02/13 0700 In: 480 [P.O.:480] Out: 700 [Urine:700] Intake/Output this shift:     Recent Labs  03/29/15 0719 03/30/15 0301 03/31/15 0357  HGB 8.1* 8.5* 8.0*    Recent Labs  03/30/15 0301 03/31/15 0357  WBC 8.3 7.5  RBC 2.87* 2.67*  HCT 26.9* 25.4*  PLT 214 164    Recent Labs  03/30/15 0301 03/31/15 0357  NA 137 135  K 4.0 3.7  CL 103 102  CO2 26 25  BUN 18 22*  CREATININE 1.02 1.08  GLUCOSE 122* 111*  CALCIUM 8.5* 8.3*   No results for input(s): LABPT, INR in the last 72 hours.  Physical examination He is extremely pleasant, conversant, in no acute distress, alert and oriented 3 HEENT: Atraumatic normocephalic Chest: Equal expansions are present respirations are nonlabored Abdomen nontender Left upper extremity: Splint is clean dry and intact, he has good early digital range of motion with resolving edema about the digits and dorsal hand. Sensation and refill are intact radial, ulnar and median nerve are intact. Lower extremities: No edema, ecchymosis, sensation refill are intact, range of motion is intact  Assessment/Plan: 2 Days Post-Op Procedure(s) (LRB): LEFT ELBOW OPEN REDUCTION INTERNAL FIXATION (ORIF) DISTAL  HUMERUS FRACTURE WITH OLECRANON OSTEOTOMY AND ULNAR NERVE RELEASE (Left) We will continue working diligently with therapy, given his significant gait difficulties which of course have recommended short-term nursing facility/rehabilitation facility. We will continue daily occupational and physical therapy while inpatient. We'll continue to monitor his hemoglobin, currently at this juncture he denies any significant symptomatology, however, we would be quick to transfuse him if needed. We will continue SCDs, calf pumps, close observation. All questions are encouraged and answered. I discussed with him his injection of Aranesp, chronic anemia, this was to be given last week, however, in lieu of his recent surgical endeavors he was unable to have the injection. Have discussed this with the pharmacist and we will see what the injection while inpatient.  [name] 03/31/2015, 8:25 AM

## 2015-03-31 NOTE — Progress Notes (Signed)
Pts temp 100.6. RN instructed pt on use of IS, pt able to get IS up to 1250 consistently. [facility] notified. 500 cc bolus of NS ordered and carried out. Temp recheck is 99.1. Nursing will continue to monitor.

## 2015-03-31 NOTE — Clinical Social Work Note (Signed)
CSW received phone call from Sandi Raveling [phone] she is the case manager working with patient's SNF placement.  Patient's case number is [id number], CSW spoke to patient and family to inform them that CSW is looking for SNF placement.  CSW informed patient and his wife who was at bedside that because it is a worker's comp claim the options will be limited on where he can go for short term rehab.  Patient and family are aware of this and are in agreement to going to SNF for rehab.  Patient currently does not have any bed offers, work comp Tourist information centre manager is calling SNFs as well to find placement for patient.  CSW to continue to follow patient's progress.  Jones Broom. [place], MSW, [place] 03/31/2015 6:12 PM

## 2015-03-31 NOTE — Care Management Note (Addendum)
Case Management Note  Patient Details  Name: [name] [name] MRN: [id number] Date of Birth: [birth year]  Subjective/Objective:           Admitted with left humerus fracture, s/p ORIF        Action/Plan: PT recommending SNF. Spoke with patient, he is agreeable with going to SNF for rehab and I explained that I would contact his employer for [employer] comp information. Contacted employer,spoke with Ritta Slot, [employer] is carrier [phone], claim # is [id number] and Chyrl Civatte is the adjustor. Left voicemail for Ms Buffett. Referral made to CSW. Will continue to follow. Received call from adjustor Ms Buffett, she requested PT and OT notes,MD progress note, op note, and H and P. She stated that she will review notes and contact MD office since she was informed by MD office that patient would not have any postop needs.She will contact me once she reviews information. Faxed requested information to [phone] and received confirmation. Will continue to follow.  Received call from Ms Buffett, she stated that she has assigned a case manager to work on transferring patient to a SNF, Sandi Raveling [phone]. Explained that I would give information to CSW and he would contact Ms Allred. Gave contact information to CSW. Will continue to follow.      Expected Discharge Date:  03/30/15               Expected Discharge Plan:  Skilled Nursing Facility  In-House Referral:  Clinical Social Work  Discharge planning Services  CM Consult  Post Acute Care Choice:    Choice offered to:     DME Arranged:    DME Agency:     HH Arranged:    [place] Agency:     Status of Service:  In process, will continue to follow  Medicare Important Message Given:    Date Medicare IM Given:    Medicare IM give by:    Date Additional Medicare IM Given:    Additional Medicare Important Message give by:     If discussed at [place] of Stay Meetings, dates discussed:    Additional Comments:  Nila Nephew, RN 03/31/2015, 9:59 AM

## 2015-03-31 NOTE — Progress Notes (Signed)
Occupational Therapy Treatment Patient Details Name: COSTA MACKIEWICZ MRN: [id number] DOB: Dec 17, 1924 Today's Date: 03/31/2015    History of present illness Pt is an 82 y.o. male s/p LEFT ELBOW OPEN REDUCTION INTERNAL FIXATION (ORIF) DISTAL HUMERUS FRACTURE WITH OLECRANON OSTEOTOMY AND ULNAR NERVE RELEASE (Left). PMH: HTN, hearing loss, COPD, OSA, prostate cancer, Exertional dyspnea, pacemaker, anemia, Chronic lower back pain; R TKA, L carpal tunnel release.   OT comments  Pt. Seen for ROM, retrograde massage, and elevation of LUE.  Able to return demo of rom techniques.  "yeah, i wiggle them as much as i can".  In reference to his digits.  Will continue to follow acutely.    Follow Up Recommendations  SNF;Supervision/Assistance - 24 hour    Equipment Recommendations  Other (comment)    Recommendations for Other Services      Precautions / Restrictions Precautions Precautions: Fall Required Braces or Orthoses: Other Brace/Splint Restrictions Weight Bearing Restrictions: Yes LUE Weight Bearing: Non weight bearing       Mobility Bed Mobility                  Transfers                      Balance                                   ADL                                                Vision                     Perception     Praxis      Cognition   Behavior During Therapy: WFL for tasks assessed/performed Overall Cognitive Status: Within Functional Limits for tasks assessed                       Extremity/Trunk Assessment               Exercises Other Exercises Other Exercises: Educated on finger ROM, retrograde massage, and elevation for edema control.   Shoulder Instructions       General Comments      Pertinent Vitals/ Pain       Pain Assessment: 0-10 (did not rate but states "i think im about ready for a pill") Pain Intervention(s): Patient requesting pain meds-RN  notified  Home Living                                          Prior Functioning/Environment              Frequency Min 2X/week     Progress Toward Goals  OT Goals(current goals can now be found in the care plan section)  Progress towards OT goals: Progressing toward goals     Plan Discharge plan remains appropriate    Co-evaluation                 End of Session     Activity Tolerance Patient tolerated treatment well   Patient Left in chair;with call bell/phone within reach;with chair alarm set  Nurse Communication          Time: [id number] OT Time Calculation (min): 19 min  Charges: OT General Charges $OT Visit: 1 Procedure OT Treatments $Therapeutic Exercise: 8-22 mins  Janice Coffin, COTA/L 03/31/2015, 11:11 AM

## 2015-04-01 ENCOUNTER — Encounter (HOSPITAL_COMMUNITY): Payer: Self-pay | Admitting: Orthopedic Surgery

## 2015-04-01 ENCOUNTER — Inpatient Hospital Stay (HOSPITAL_COMMUNITY): Payer: Worker's Compensation

## 2015-04-01 DIAGNOSIS — I482 Chronic atrial fibrillation: Secondary | ICD-10-CM

## 2015-04-01 LAB — CBC WITH DIFFERENTIAL/PLATELET
BASOS PCT: 0 %
Basophils Absolute: 0 10*3/uL (ref 0.0–0.1)
EOS ABS: 0.1 10*3/uL (ref 0.0–0.7)
Eosinophils Relative: 2 %
HCT: 26.9 % — ABNORMAL LOW (ref 39.0–52.0)
HEMOGLOBIN: 8.5 g/dL — AB (ref 13.0–17.0)
Lymphocytes Relative: 12 %
Lymphs Abs: 0.9 10*3/uL (ref 0.7–4.0)
MCH: 29.5 pg (ref 26.0–34.0)
MCHC: 31.6 g/dL (ref 30.0–36.0)
MCV: 93.4 fL (ref 78.0–100.0)
Monocytes Absolute: 0.4 10*3/uL (ref 0.1–1.0)
Monocytes Relative: 6 %
NEUTROS PCT: 80 %
Neutro Abs: 5.8 10*3/uL (ref 1.7–7.7)
Platelets: 202 10*3/uL (ref 150–400)
RBC: 2.88 MIL/uL — AB (ref 4.22–5.81)
RDW: 14.2 % (ref 11.5–15.5)
WBC: 7.1 10*3/uL (ref 4.0–10.5)

## 2015-04-01 MED ORDER — FERROUS SULFATE 325 (65 FE) MG PO TABS: 325.0000 mg | ORAL_TABLET | Freq: Two times a day (BID) | ORAL | Status: DC

## 2015-04-01 MED ORDER — OXYCODONE HCL 5 MG PO TABS: 5.0000 mg | ORAL_TABLET | ORAL | Status: DC | PRN

## 2015-04-01 MED ORDER — ACETAMINOPHEN 500 MG PO TABS
500.0000 mg | ORAL_TABLET | Freq: Four times a day (QID) | ORAL | Status: DC | PRN
  Administered 2015-04-01: 1000 mg via ORAL
  Filled 2015-04-01: qty 2

## 2015-04-01 NOTE — Clinical Social Work Note (Signed)
Clinical Social Worker has left a detailed message with worker's comp case manager, Sandi Raveling in regards to SNF placement. Currently patient has one bed offer: [facility].  CSW awaiting a returned phone call from worker's comp case Freight forwarder. CSW remains available as needed.  Glendon Axe, MSW, LCSWA [phone] 04/01/2015 11:33 AM

## 2015-04-01 NOTE — Discharge Instructions (Signed)
These do not weight-bear to the left upper extremity, please move her fingers frequently attempt to make a full fist and extend the fingers 20 times per hour during waking hours at least 3 times daily. Wear your sling when you are ambulating. You may remove the sling with sedentary activities and elevate the arm above the heart. Do not get dressings wet, please keep them clean and dry, do not remove the dressings. Keep bandage clean and dry.  Call for any problems.  No smoking.  Criteria for driving a car: you should be off your pain medicine for 7-8 hours, able to drive one handed(confident), thinking clearly and feeling able in your judgement to drive. Continue elevation as it will decrease swelling.  If instructed by MD move your fingers within the confines of the bandage/splint.  Use ice if instructed by your MD. Call immediately for any sudden loss of feeling in your hand/arm or change in functional abilities of the extremity. We recommend that you to take vitamin C 1000 mg a day to promote healing. We also recommend that if you require  pain medicine that you take a stool softener to prevent constipation as most pain medicines will have constipation side effects. We recommend either Peri-Colace or Senokot and recommend that you also consider adding MiraLAX as well to prevent the constipation affects from pain medicine if you are required to use them. These medicines are over the counter and may be purchased at a local pharmacy. A cup of yogurt and a probiotic can also be helpful during the recovery process as the medicines can disrupt your intestinal environment.

## 2015-04-01 NOTE — Progress Notes (Signed)
Utilization review competed. 

## 2015-04-01 NOTE — Progress Notes (Signed)
Physical Therapy Treatment Patient Details Name: [name] [name] MRN: [id number] DOB: 1924-06-11 Today's Date: 04/01/2015    History of Present Illness Pt is an 82 y.o. male s/p LEFT ELBOW OPEN REDUCTION INTERNAL FIXATION (ORIF) DISTAL HUMERUS FRACTURE WITH OLECRANON OSTEOTOMY AND ULNAR NERVE RELEASE (Left). PMH: HTN, hearing loss, COPD, OSA, prostate cancer, Exertional dyspnea, pacemaker, anemia, Chronic lower back pain; R TKA, L carpal tunnel release.    PT Comments    Patient progressing toward mobility goals. Used straight cane for ambulation this session. Continue to progress as tolerated with anticipated d/c to SNF for further skilled PT services.    Follow Up Recommendations  SNF;Supervision for mobility/OOB     Equipment Recommendations  Other (comment) (TBD at next venue of care)    Recommendations for Other Services       Precautions / Restrictions Precautions Precautions: Fall Required Braces or Orthoses: Other Brace/Splint (sling) Restrictions Weight Bearing Restrictions: Yes LUE Weight Bearing: Non weight bearing Other Position/Activity Restrictions: MD confirmed NWB Lt UE    Mobility  Bed Mobility               General bed mobility comments: OOB in chair upon arrival  Transfers Overall transfer level: Needs assistance Equipment used: 2 person hand held assist;Straight cane Transfers: Sit to/from Stand Sit to Stand: Min assist;+2 physical assistance         General transfer comment: assistance to power up into standing with use of gait belt and bed pad; pt rocks back and forth for momentum and assisted with R UE; straight cane use upon standing with min A needed to gain balance and cues for bilat foot positioning due to wide BOS upon standing  Ambulation/Gait Ambulation/Gait assistance: Mod assist;+2 safety/equipment Ambulation Distance (Feet): 60 Feet Assistive device: Straight cane Gait Pattern/deviations: Step-through pattern;Decreased stride  length;Staggering right;Trunk flexed;Wide base of support     General Gait Details: assist for maintaining balance and cues for sequencing with use of AD, posture, and increased safety awareness; pt with frequent changes in gait velocity    Stairs            Wheelchair Mobility    Modified Rankin (Stroke Patients Only)       Balance Overall balance assessment: Needs assistance Sitting-balance support: Feet supported;Single extremity supported Sitting balance-Leahy Scale: Fair     Standing balance support: Bilateral upper extremity supported Standing balance-Leahy Scale: Poor                      Cognition Arousal/Alertness: Awake/alert Behavior During Therapy: Flat affect;Impulsive Overall Cognitive Status: History of cognitive impairments - at baseline (decreased safety awareness and insight to deficits) Area of Impairment: Safety/judgement     Memory: Decreased recall of precautions   Safety/Judgement: Decreased awareness of safety;Decreased awareness of deficits     General Comments: pt reported that he does not want help to stand from chair but unable to stand on his own    Exercises General Exercises - Lower Extremity Ankle Circles/Pumps: AROM;Both;10 reps;Seated Other Exercises Other Exercises: finger ROM and massage with pt demonstrating carry over from previous session    General Comments        Pertinent Vitals/Pain Pain Assessment: Faces Faces Pain Scale: Hurts a little bit Pain Location: L UE Pain Descriptors / Indicators: Sore Pain Intervention(s): Monitored during session;Premedicated before session;Limited activity within patient's tolerance;Repositioned    Home Living  Prior Function            PT Goals (current goals can now be found in the care plan section) Acute Rehab PT Goals Patient Stated Goal: none stated PT Goal Formulation: With patient Time For Goal Achievement: 04/11/15 Potential to  Achieve Goals: Good Progress towards PT goals: Progressing toward goals    Frequency  Min 3X/week    PT Plan Current plan remains appropriate    Co-evaluation             End of Session Equipment Utilized During Treatment: Gait belt;Other (comment) (sling) Activity Tolerance: Patient tolerated treatment well Patient left: in chair;with call bell/phone within reach;with chair alarm set     Time: [id number] PT Time Calculation (min) (ACUTE ONLY): 24 min  Charges:  $Gait Training: 8-22 mins $Therapeutic Activity: 8-22 mins                    G Codes:      Salina April, PTA Pager: [phone]   04/01/2015, 11:05 AM

## 2015-04-01 NOTE — Discharge Summary (Signed)
Physician Discharge Summary  Patient ID: [name] [name] MRN: [id number] DOB/AGE: 1924-04-18 82 y.o.  Admit date: 03/29/2015 Discharge date:  tentative discharge 04/02/2015  Admission Diagnoses: left elbow distal humerus fracture Past Medical History  Diagnosis Date  . Hypertension   . Hearing loss   . Leg swelling 10/19/2011    LLE  . COPD (chronic obstructive pulmonary disease) ([facility]) 10/19/2011    "touch"  . Exertional dyspnea 10/19/2011  . OSA on CPAP 10/19/2011    "sometimes wear CPAP"  . Prostate cancer ([facility])     S/P radiation tx ~ 2008  . History of blood transfusion     "think it was related to pacemaker placement"  . GERD (gastroesophageal reflux disease)   . Arthritis     "back"  . Chronic lower back pain   . Skin change     12-24-14 few scalp lesions burned off"old age spots"  . Anemia     occ. injections for this at [facility]  . Pacemaker     [employer]    Discharge Diagnoses:  Principal Problem:   Left elbow fracture Active Problems:   Anemia   COPD (chronic obstructive pulmonary disease) with emphysema (HCC)   Pacemaker   Prostate cancer (HCC)   Atrial fibrillation (HCC)   DVT, lower extremity, distal, chronic (HCC)   Essential hypertension, benign   SOB (shortness of breath)   Surgeries: Procedure(s): LEFT ELBOW OPEN REDUCTION INTERNAL FIXATION (ORIF) DISTAL HUMERUS FRACTURE WITH OLECRANON OSTEOTOMY AND ULNAR NERVE RELEASE on 03/29/2015    Consultants: Treatment Team:  Marcheta Grammes, MD  Discharged Condition: Improved  Hospital Course: [name] [name] is an 82 y.o. male who was admitted 03/29/2015 . The patient and forcibly sustained a highly comminuted left distal humerus fracture. We have reviewed his findings in our office setting and discussed with him and our recommendations for surgical intervention. Patient was admitted February 11 and underwent open reduction internal fixation of his left distal humerus fracture with ulnar nerve  decompression and transposition. He did very well intraoperatively and there were no complications. Medicine was consulted given his history of chronic anemia, COPD, history of pacemaker, history of atrial fibrillation, DVT, hypertension .The patient was admitted to the orthopedic unit for close observation, cardiac monitoring given his history of PAF, pain management and IV antibiotics. Over the course of his postoperative stay and there was no complications noted. The patient remained stable from a cardiac standpoint. SCDs and heparin were given for anticoagulation purposes.  The patient progressed very well over the next few days. He was generally deconditioned as noted by himself as well as our physical and occupational therapy consults. We've recommended that the patient consider short-term nursing facility/rehabilitative facility. In addition, OT and PT recommended further inpatient rehabilitation in the patient's general deconditioning and difficulty ambulating. The patient remained medically stable throughout his stay and there was no complicating features. On postoperative day #3 the patient was sitting up in a chair, he was conversant and very pleasant. He was noted to have moderate swelling about his hand secondary to dependent edema. We discussed with him the need for elevation and finger range of motion. Sensation was intact, refill is intact. He was able to actively make a fist and extend the fingers. He denied shortness of breath, chest pain, fever or chills. He was noted to have a low-grade fever intermittently is likely associated with atelectasis. He was voiding without difficulties and in addition had a bowel movement without difficulties. His care  consult had been implemented and a bed at tentatively secured at [facility] facilities/rehabilitative facilities. The patient will be discharged tomorrow pending no changes to [facility].  He will need to continue a nonweightbearing status to  the left upper extremity, however, therapy can work on gentle passive range of motion of his shoulder, encourage full active range of motion and passive range of motion as well as massage and edema control to the digits and hand. Gait training and strengthening will need to be provided given his overall deconditioning. He has been given oxycodone for pain to take as needed. He will continue his regular medication regime as noted in his discharge summary. He will need to continue incentive spirometry [place] 10 minutes during waking hours over the next week. He will need to follow up with his medical physician for the Aranesp injection as this was due last week and he missed it. We will need to see him in our office in approximate 10 days, call [phone] for an appointment or questions or concerns.  They were given perioperative antibiotics: Anti-infectives    Start     Dose/Rate Route Frequency Ordered Stop   03/29/15 2000  ceFAZolin (ANCEF) IVPB 1 g/50 mL premix     1 g 100 mL/hr over 30 Minutes Intravenous 3 times per day 03/29/15 1130     03/29/15 1145  ceFAZolin (ANCEF) IVPB 1 g/50 mL premix     1 g 100 mL/hr over 30 Minutes Intravenous NOW 03/29/15 1130 03/29/15 1213   03/29/15 0700  ceFAZolin (ANCEF) IVPB 2 g/50 mL premix  Status:  Discontinued     2 g 100 mL/hr over 30 Minutes Intravenous To [facility] 03/28/15 1005 03/28/15 1633    .  They were given sequential compression devices, early ambulation, and heparin and aspirin  Recent vital signs: Patient Vitals for the past 24 hrs:  BP Temp Temp src Pulse Resp SpO2  04/01/15 1300 (!) 112/42 mmHg 98.4 F (36.9 C) - 75 18 99 %  04/01/15 0456 (!) 136/49 mmHg 99.9 F (37.7 C) Oral 64 18 96 %  03/31/15 2256 - 99.1 F (37.3 C) - - - -  03/31/15 1952 (!) 127/58 mmHg (!) 100.6 F (38.1 C) Oral 76 17 96 %  .  Recent laboratory studies: Dg Chest Port 1 View  04/01/2015  CLINICAL DATA:  Low grade fever.  Shortness of breath.  EXAM: PORTABLE CHEST 1 VIEW COMPARISON:  03/30/2015 FINDINGS: Cardiac pacemaker. Shallow inspiration. Normal heart size and pulmonary vascularity. No focal airspace disease or consolidation in the lungs. No blunting of costophrenic angles. No pneumothorax. Mediastinal contours appear intact. Tortuous aorta. Degenerative changes in the shoulders. IMPRESSION: No active disease. Electronically Signed   By: Lucienne Capers M.D.   On: 04/01/2015 06:53    Discharge Medications:     Medication List    STOP taking these medications        oxyCODONE-acetaminophen 5-325 MG tablet  Commonly known as:  PERCOCET/ROXICET      TAKE these medications        ANORO ELLIPTA 62.5-25 MCG/INH Aepb  Generic drug:  umeclidinium-vilanterol  Inhale 1 puff into the lungs daily.     ARANESP (ALB FREE) SURECLICK [id number] MCG/ML injection  Generic drug:  darbepoetin alfa-polysorbate  Inject 500 mcg into the skin See admin instructions. Every 2 months     aspirin EC 81 MG tablet  Take 81 mg by mouth daily.     Calcium 600-200 MG-UNIT tablet  Take 1 tablet by mouth 2 (two) times daily.     cholecalciferol 1000 units tablet  Commonly known as:  VITAMIN D  Take 1,000 Units by mouth daily.     ferrous sulfate 325 (65 FE) MG tablet  Take 1 tablet (325 mg total) by mouth 2 (two) times daily with a meal.     Fish Oil 1200 MG Caps  Take 1 capsule by mouth daily.     furosemide 40 MG tablet  Commonly known as:  LASIX  Take 40-80 mg by mouth See admin instructions. Take 40mg  daily.  May take an additional 40mg  for leg swelling     lisinopril 20 MG tablet  Commonly known as:  PRINIVIL,ZESTRIL  Take 10 mg by mouth 2 (two) times daily.     omeprazole 20 MG capsule  Commonly known as:  PRILOSEC  Take 20 mg by mouth daily.     oxybutynin 15 MG 24 hr tablet  Commonly known as:  DITROPAN XL  Take 15 mg by mouth every other day.     oxyCODONE 5 MG immediate release tablet  Commonly known as:  Oxy IR/ROXICODONE   Take 1-2 tablets (5-10 mg total) by mouth every 3 (three) hours as needed for moderate pain.     PRESERVISION AREDS PO  Take 1 tablet by mouth 2 (two) times daily.     vitamin B-12 500 MCG tablet  Commonly known as:  CYANOCOBALAMIN  Take 500 mcg by mouth daily.        Diagnostic Studies: Dg Lumbar Spine Complete  03/12/2015  CLINICAL DATA:  Pain following fall EXAM: LUMBAR SPINE - COMPLETE 4+ VIEW COMPARISON:  Lumbar spine CT March 29, 2014 FINDINGS: Frontal, lateral, spot lumbosacral lateral, and bilateral oblique views were obtained. The there are 5 non-rib-bearing lumbar type vertebral bodies. There is thoracolumbar dextroscoliosis with rotatory component. There is no acute fracture or spondylolisthesis. There is remodeling of the T12 and L1 vertebral bodies due to chronic scoliosis, stable. There is moderate disc narrowing at all levels with somewhat more severe disc space narrowing at T12-L1, L1-2, and L2-3. There are anterior osteophytes at all levels. There is facet osteoarthritic change at all levels bilaterally. There is atherosclerotic calcification in the aorta. IMPRESSION: Scoliosis with multilevel arthropathy. No acute fracture or spondylolisthesis. Atherosclerotic calcification in aorta. There is not appear to be significant interval change compared to prior CT. Electronically Signed   By: Lowella Grip III M.D.   On: 03/12/2015 17:46   Dg Elbow Complete Left  03/12/2015  CLINICAL DATA:  Status post fall.  Left elbow pain. EXAM: LEFT ELBOW - COMPLETE 3+ VIEW COMPARISON:  None. FINDINGS: There is a mildly displaced transcondylar left elbow fracture with mild lateral displacement measuring 9 mm. There appears to be intra-articular extension of the fracture with a vertically oriented component. Soft tissues are unremarkable. IMPRESSION: Mildly displaced transcondylar left elbow fracture with mild lateral displacement measuring 9 mm. There appears to be intra-articular extension of  the fracture with a vertically oriented component. Electronically Signed   By: Kathreen Devoid   On: 03/12/2015 13:13   Dg Tibia/fibula Right  03/12/2015  CLINICAL DATA:  Pain following fall EXAM: RIGHT TIBIA AND FIBULA - 2 VIEW COMPARISON:  Right knee September 02, 2008 FINDINGS: Frontal and lateral views were obtained. The patient is status post total knee replacement with femoral and tibial prosthetic components appearing well-seated. There is a small joint effusion. There is bony overgrowth along the superior patella, stable.  There is no acute fracture or dislocation. There is osteoarthritic change in the ankle joint. There are scattered small phleboliths. There is also arterial vascular calcification. IMPRESSION: Total knee replacement with prosthetic components in this region well-seated. Stable bony overgrowth along the superior patella. No acute fracture or dislocation. Mild osteoarthritic change in the ankle joint. Arterial vascular calcification noted. Electronically Signed   By: Lowella Grip III M.D.   On: 03/12/2015 17:41   Dg Chest Port 1 View  04/01/2015  CLINICAL DATA:  Low grade fever.  Shortness of breath. EXAM: PORTABLE CHEST 1 VIEW COMPARISON:  03/30/2015 FINDINGS: Cardiac pacemaker. Shallow inspiration. Normal heart size and pulmonary vascularity. No focal airspace disease or consolidation in the lungs. No blunting of costophrenic angles. No pneumothorax. Mediastinal contours appear intact. Tortuous aorta. Degenerative changes in the shoulders. IMPRESSION: No active disease. Electronically Signed   By: Lucienne Capers M.D.   On: 04/01/2015 06:53   Dg Chest Port 1 View  03/30/2015  CLINICAL DATA:  One day history of shortness of breath EXAM: PORTABLE CHEST 1 VIEW COMPARISON:  October 07, 2014 FINDINGS: There is no demonstrable edema or consolidation. Heart size and pulmonary vascularity are within normal limits. Pacemaker leads are attached to the right atrium and right ventricle. No  adenopathy. There old healed rib fractures on the left and right sides. IMPRESSION: No edema or consolidation. Electronically Signed   By: Lowella Grip III M.D.   On: 03/30/2015 08:03   Dg Shoulder Left  03/12/2015  CLINICAL DATA:  Left shoulder pain following a fall today. EXAM: LEFT SHOULDER - 2+ VIEW COMPARISON:  Left shoulder CT arthrogram dated 10/10/2007. FINDINGS: There is superior migration of the humeral head with glenohumeral joint space narrowing and inferior spur formation. No fracture or dislocation is seen. A left subclavian dual lead pacemaker is noted. IMPRESSION: 1. No fracture or dislocation. 2. Glenohumeral joint degenerative changes. 3. Superior migration of the humeral head compatible with a chronic rotator cuff tear. Electronically Signed   By: Claudie Revering M.D.   On: 03/12/2015 13:16   Dg Hip Unilat With Pelvis 2-3 Views Left  03/12/2015  CLINICAL DATA:  Pain following fall EXAM: DG HIP (WITH OR WITHOUT PELVIS) 2-3V LEFT COMPARISON:  None. FINDINGS: Frontal pelvis as well as frontal and lateral left hip images were obtained of. There is no demonstrable acute fracture or dislocation. There is mild symmetric narrowing of both hip joints. No erosive change. Postoperative change is noted in the lower pelvis. IMPRESSION: Mild symmetric narrowing both hip joints. No fracture or dislocation. Electronically Signed   By: Lowella Grip III M.D.   On: 03/12/2015 17:48    They benefited maximally from their hospital stay and there were no complications.     Disposition: 01-Home or Self Care     Discharge Instructions    Call MD / Call 911    Complete by:  As directed   If you experience chest pain or shortness of breath, CALL 911 and be transported to the hospital emergency room.  If you develope a fever above 101 F, pus (white drainage) or increased drainage or redness at the wound, or calf pain, call your surgeon's office.     Constipation Prevention    Complete by:  As  directed   Drink plenty of fluids.  Prune juice may be helpful.  You may use a stool softener, such as Colace (over the counter) 100 mg twice a day.  Use MiraLax (over the counter) for  constipation as needed.     Diet - low sodium heart healthy    Complete by:  As directed      Discharge instructions    Complete by:  As directed   .Keep bandage clean and dry.  Call for any problems.  No smoking.  Criteria for driving a car: you should be off your pain medicine for 7-8 hours, able to drive one handed(confident), thinking clearly and feeling able in your judgement to drive. Continue elevation as it will decrease swelling.  If instructed by MD move your fingers within the confines of the bandage/splint.  Use ice if instructed by your MD. Call immediately for any sudden loss of feeling in your hand/arm or change in functional abilities of the extremity. We recommend that you to take vitamin C 1000 mg a day to promote healing. We also recommend that if you require  pain medicine that you take a stool softener to prevent constipation as most pain medicines will have constipation side effects. We recommend either Peri-Colace or Senokot and recommend that you also consider adding MiraLAX as well to prevent the constipation affects from pain medicine if you are required to use them. These medicines are over the counter and may be purchased at a local pharmacy. A cup of yogurt and a probiotic can also be helpful during the recovery process as the medicines can disrupt your intestinal environment.     Increase activity slowly as tolerated    Complete by:  As directed           Follow-up Information    Schedule an appointment as soon as possible for a visit with Paulene Floor, MD.   Specialty:  Orthopedic Surgery   Why:  For suture removal, For wound re-check   Contact information:   [address] [place] [address] [id number]        Signed: Ivan Croft 04/01/2015,  6:05 PM

## 2015-04-01 NOTE — Clinical Social Work Note (Signed)
Worker's comp Tourist information centre manager has established a Soil scientist with [employer] [facility] [employer]. Patient can transition to SNF on 2/15 if medically stable for discharge.   CSW remains available as needed.   Glendon Axe, MSW, LCSWA [phone] 04/01/2015 4:10 PM

## 2015-04-01 NOTE — Progress Notes (Addendum)
[facility] consult note                                                                                                                                                                                                     Patient Demographics:    [name] [name], is a 82 y.o. male, DOB - 10/18/1924, [id number]  Admit date - 03/29/2015   Admitting Physician Roseanne Kaufman, MD  Outpatient Primary MD for the patient is Mathews Argyle, MD  LOS - 3   No chief complaint on file.       Subjective:    [name] [name] today has, No headache, No chest pain, No abdominal pain - No Nausea, No new weakness tingling or numbness, No Cough - SOB.    Assessment  & Plan :     1. Fall related left elbow fracture, status post ORIF by Dr. Amedeo Plenty on 03/29/2015, seems to have tolerated the procedure well. Minimal perioperative blood loss related anemia not requiring any transfusion. Added heparin for DVT prophylaxis further management defer to primary team. On cefazolin per primary team, if low-grade fevers continue we'll defer the reevaluation of the postop site to primary team.  2. Anemia of chronic disease, iron deficiency and perioperative blood loss related anemia. Monitor H&H no need for transfusion. Placed on oral iron.  3. Paroxysmal atrial fibrillation with pacemaker and [place] Vasc 2 score of greater than 3. On aspirin at home, no rate controlling agents, monitor. No acute issues. Not on anticoagulation chronically, defer to primary care physician and primary cardiologist on long-term anticoagulation.  4. History of left lower leg DVT. Placed on heparin for DVT prophylaxis, on aspirin daily continue. Also on SCDs here.  5. Essential hypertension. Currently on ACE inhibitor and Lasix continued.  6. Low-grade fever morning of 03/30/2015 and 04/01/2015. Chest x-ray and UA stable, IS added, no  cough no shortness of breath or dysuria. Monitor. Likely reactive after surgery with possible mild atelectasis. Nontoxic appearance. If fevers recur will obtain blood cultures, will request primary team to reevaluate postop site. No other source.    DVT Prophylaxis  :   Heparin   Disposition - SNF  Consult - Dr Amedeo Plenty  Procedure - ORIF L humerus 03-29-15    Lab Results  Component Value Date   PLT 202 04/01/2015    Inpatient Medications  Scheduled Meds: . aspirin EC  81 mg Oral Daily  .  ceFAZolin (ANCEF) IV  1 g Intravenous 3 times per day  . cholecalciferol  1,000 Units Oral Daily  . ferrous sulfate  325 mg Oral  BID WC  . furosemide  40 mg Oral Daily  . heparin subcutaneous  5,000 Units Subcutaneous 3 times per day  . lisinopril  10 mg Oral BID  . oxybutynin  15 mg Oral QODAY  . pantoprazole  40 mg Oral Daily  . senna  1 tablet Oral BID  . umeclidinium-vilanterol  1 puff Inhalation Daily  . vitamin B-12  500 mcg Oral Daily   Continuous Infusions:   PRN Meds:.ALPRAZolam, furosemide, magnesium citrate, morphine injection, [DISCONTINUED] ondansetron **OR** ondansetron (ZOFRAN) IV, oxyCODONE  Antibiotics  :     Anti-infectives    Start     Dose/Rate Route Frequency Ordered Stop   03/29/15 2000  ceFAZolin (ANCEF) IVPB 1 g/50 mL premix     1 g 100 mL/hr over 30 Minutes Intravenous 3 times per day 03/29/15 1130     03/29/15 1145  ceFAZolin (ANCEF) IVPB 1 g/50 mL premix     1 g 100 mL/hr over 30 Minutes Intravenous NOW 03/29/15 1130 03/29/15 1213   03/29/15 0700  ceFAZolin (ANCEF) IVPB 2 g/50 mL premix  Status:  Discontinued     2 g 100 mL/hr over 30 Minutes Intravenous To [facility] 03/28/15 1005 03/28/15 1633        Objective:   Filed Vitals:   03/31/15 1526 03/31/15 1952 03/31/15 2256 04/01/15 0456  BP: 107/46 127/58  136/49  Pulse: 69 76  64  Temp: 99.1 F (37.3 C) 100.6 F (38.1 C) 99.1 F (37.3 C) 99.9 F (37.7 C)  TempSrc: Oral Oral  Oral   Resp:  17  18  Height:      Weight:      SpO2: 98% 96%  96%    Wt Readings from Last 3 Encounters:  03/29/15 81.7 kg (180 lb 1.9 oz)  03/12/15 81.647 kg (180 lb)  01/14/15 82.101 kg (181 lb)     Intake/Output Summary (Last 24 hours) at 04/01/15 1127 Last data filed at 04/01/15 0826  Gross per 24 hour  Intake    840 ml  Output    600 ml  Net    240 ml     Physical Exam  Awake Alert, Oriented X 3, No new F.N deficits, Normal affect [place].AT,PERRAL Supple Neck,No JVD, No cervical lymphadenopathy appriciated.  Symmetrical Chest wall movement, Good air movement bilaterally, CTAB RRR,No Gallops,Rubs or new Murmurs, No Parasternal Heave +ve B.Sounds, Abd Soft, No tenderness, No organomegaly appriciated, No rebound - guarding or rigidity. No Cyanosis, Clubbing or edema, No new Rash or bruise, left elbow and cast and sling, drain in place, L hand swollen    Data Review:   Micro Results Recent Results (from the past 240 hour(s))  Urine culture     Status: None   Collection Time: 03/30/15  8:35 AM  Result Value Ref Range Status   Specimen Description URINE, CLEAN CATCH  Final   Special Requests NONE  Final   Culture 9,000 COLONIES/mL INSIGNIFICANT GROWTH  Final   Report Status 03/31/2015 FINAL  Final    Radiology Reports Dg Lumbar Spine Complete  03/12/2015  CLINICAL DATA:  Pain following fall EXAM: LUMBAR SPINE - COMPLETE 4+ VIEW COMPARISON:  Lumbar spine CT March 29, 2014 FINDINGS: Frontal, lateral, spot lumbosacral lateral, and bilateral oblique views were obtained. The there are 5 non-rib-bearing lumbar type vertebral bodies. There is thoracolumbar dextroscoliosis with rotatory component. There is no acute fracture or spondylolisthesis. There is remodeling of the T12 and L1 vertebral bodies due to chronic scoliosis,  stable. There is moderate disc narrowing at all levels with somewhat more severe disc space narrowing at T12-L1, L1-2, and L2-3. There are anterior osteophytes  at all levels. There is facet osteoarthritic change at all levels bilaterally. There is atherosclerotic calcification in the aorta. IMPRESSION: Scoliosis with multilevel arthropathy. No acute fracture or spondylolisthesis. Atherosclerotic calcification in aorta. There is not appear to be significant interval change compared to prior CT. Electronically Signed   By: Lowella Grip III M.D.   On: 03/12/2015 17:46   Dg Elbow Complete Left  03/12/2015  CLINICAL DATA:  Status post fall.  Left elbow pain. EXAM: LEFT ELBOW - COMPLETE 3+ VIEW COMPARISON:  None. FINDINGS: There is a mildly displaced transcondylar left elbow fracture with mild lateral displacement measuring 9 mm. There appears to be intra-articular extension of the fracture with a vertically oriented component. Soft tissues are unremarkable. IMPRESSION: Mildly displaced transcondylar left elbow fracture with mild lateral displacement measuring 9 mm. There appears to be intra-articular extension of the fracture with a vertically oriented component. Electronically Signed   By: Kathreen Devoid   On: 03/12/2015 13:13   Dg Tibia/fibula Right  03/12/2015  CLINICAL DATA:  Pain following fall EXAM: RIGHT TIBIA AND FIBULA - 2 VIEW COMPARISON:  Right knee September 02, 2008 FINDINGS: Frontal and lateral views were obtained. The patient is status post total knee replacement with femoral and tibial prosthetic components appearing well-seated. There is a small joint effusion. There is bony overgrowth along the superior patella, stable. There is no acute fracture or dislocation. There is osteoarthritic change in the ankle joint. There are scattered small phleboliths. There is also arterial vascular calcification. IMPRESSION: Total knee replacement with prosthetic components in this region well-seated. Stable bony overgrowth along the superior patella. No acute fracture or dislocation. Mild osteoarthritic change in the ankle joint. Arterial vascular calcification noted.  Electronically Signed   By: Lowella Grip III M.D.   On: 03/12/2015 17:41   Dg Chest Port 1 View  04/01/2015  CLINICAL DATA:  Low grade fever.  Shortness of breath. EXAM: PORTABLE CHEST 1 VIEW COMPARISON:  03/30/2015 FINDINGS: Cardiac pacemaker. Shallow inspiration. Normal heart size and pulmonary vascularity. No focal airspace disease or consolidation in the lungs. No blunting of costophrenic angles. No pneumothorax. Mediastinal contours appear intact. Tortuous aorta. Degenerative changes in the shoulders. IMPRESSION: No active disease. Electronically Signed   By: Lucienne Capers M.D.   On: 04/01/2015 06:53   Dg Chest Port 1 View  03/30/2015  CLINICAL DATA:  One day history of shortness of breath EXAM: PORTABLE CHEST 1 VIEW COMPARISON:  October 07, 2014 FINDINGS: There is no demonstrable edema or consolidation. Heart size and pulmonary vascularity are within normal limits. Pacemaker leads are attached to the right atrium and right ventricle. No adenopathy. There old healed rib fractures on the left and right sides. IMPRESSION: No edema or consolidation. Electronically Signed   By: Lowella Grip III M.D.   On: 03/30/2015 08:03   Dg Shoulder Left  03/12/2015  CLINICAL DATA:  Left shoulder pain following a fall today. EXAM: LEFT SHOULDER - 2+ VIEW COMPARISON:  Left shoulder CT arthrogram dated 10/10/2007. FINDINGS: There is superior migration of the humeral head with glenohumeral joint space narrowing and inferior spur formation. No fracture or dislocation is seen. A left subclavian dual lead pacemaker is noted. IMPRESSION: 1. No fracture or dislocation. 2. Glenohumeral joint degenerative changes. 3. Superior migration of the humeral head compatible with a chronic rotator cuff tear. Electronically  Signed   By: Claudie Revering M.D.   On: 03/12/2015 13:16   Dg Hip Unilat With Pelvis 2-3 Views Left  03/12/2015  CLINICAL DATA:  Pain following fall EXAM: DG HIP (WITH OR WITHOUT PELVIS) 2-3V LEFT  COMPARISON:  None. FINDINGS: Frontal pelvis as well as frontal and lateral left hip images were obtained of. There is no demonstrable acute fracture or dislocation. There is mild symmetric narrowing of both hip joints. No erosive change. Postoperative change is noted in the lower pelvis. IMPRESSION: Mild symmetric narrowing both hip joints. No fracture or dislocation. Electronically Signed   By: Lowella Grip III M.D.   On: 03/12/2015 17:48     CBC  Recent Labs Lab 03/29/15 0719 03/30/15 0301 03/31/15 0357 04/01/15 0845  WBC 5.6 8.3 7.5 7.1  HGB 8.1* 8.5* 8.0* 8.5*  HCT 25.6* 26.9* 25.4* 26.9*  PLT 206 214 164 202  MCV 95.2 93.7 95.1 93.4  MCH 30.1 29.6 30.0 29.5  MCHC 31.6 31.6 31.5 31.6  RDW 14.3 14.5 14.7 14.2  LYMPHSABS  --  0.9 0.9 0.9  MONOABS  --  0.4 0.5 0.4  EOSABS  --  0.0 0.1 0.1  BASOSABS  --  0.0 0.0 0.0    Chemistries   Recent Labs Lab 03/29/15 0719 03/30/15 0301 03/31/15 0357  NA 138 137 135  K 4.2 4.0 3.7  CL 105 103 102  CO2 23 26 25   GLUCOSE 111* 122* 111*  BUN 28* 18 22*  CREATININE 1.08 1.02 1.08  CALCIUM 9.1 8.5* 8.3*   ------------------------------------------------------------------------------------------------------------------ No results for input(s): CHOL, HDL, LDLCALC, TRIG, CHOLHDL, LDLDIRECT in the last 72 hours.  No results found for: HGBA1C ------------------------------------------------------------------------------------------------------------------ No results for input(s): TSH, T4TOTAL, T3FREE, THYROIDAB in the last 72 hours.  Invalid input(s): FREET3 ------------------------------------------------------------------------------------------------------------------ No results for input(s): VITAMINB12, FOLATE, FERRITIN, TIBC, IRON, RETICCTPCT in the last 72 hours.  Coagulation profile No results for input(s): INR, PROTIME in the last 168 hours.  No results for input(s): DDIMER in the last 72 hours.  Cardiac Enzymes No  results for input(s): CKMB, TROPONINI, MYOGLOBIN in the last 168 hours.  Invalid input(s): CK ------------------------------------------------------------------------------------------------------------------ No results found for: BNP  Time Spent in minutes   35   Lala Lund K M.D on 04/01/2015 at 11:27 AM  Between 7am to 7pm - Pager - [phone]  After 7pm go to [email or web address] - password [facility]  [facility] -  Office  [phone]

## 2015-04-02 DIAGNOSIS — S42402A Unspecified fracture of lower end of left humerus, initial encounter for closed fracture: Principal | ICD-10-CM

## 2015-04-02 LAB — CBC WITH DIFFERENTIAL/PLATELET
Basophils Absolute: 0 10*3/uL (ref 0.0–0.1)
Basophils Relative: 0 %
EOS PCT: 3 %
Eosinophils Absolute: 0.1 10*3/uL (ref 0.0–0.7)
HCT: 23.4 % — ABNORMAL LOW (ref 39.0–52.0)
Hemoglobin: 7.6 g/dL — ABNORMAL LOW (ref 13.0–17.0)
LYMPHS ABS: 0.9 10*3/uL (ref 0.7–4.0)
LYMPHS PCT: 17 %
MCH: 30.4 pg (ref 26.0–34.0)
MCHC: 32.5 g/dL (ref 30.0–36.0)
MCV: 93.6 fL (ref 78.0–100.0)
Monocytes Absolute: 0.4 10*3/uL (ref 0.1–1.0)
Monocytes Relative: 7 %
Neutro Abs: 4.1 10*3/uL (ref 1.7–7.7)
Neutrophils Relative %: 73 %
PLATELETS: 171 10*3/uL (ref 150–400)
RBC: 2.5 MIL/uL — AB (ref 4.22–5.81)
RDW: 14.4 % (ref 11.5–15.5)
WBC: 5.5 10*3/uL (ref 4.0–10.5)

## 2015-04-02 NOTE — Progress Notes (Signed)
[facility] consult note                                                       From my perspective, no clear indication of source of fever although atelectasis a possibility Suggest clinical evaluation of wound if another spike in fever. Will sign off.  See details in notes below                                                                                                                                               82 year old male  multifactorial anemia (large hiatal hernia and reflux without ulcer on EGD 01/14/2015) + anemia renal deficiency Prostate cancer status post XRT 2008  COPD on Anoro gb pancreatitis status post cholecystectomy 2013 Paroxysmal A. fib ([place] score~2-3) with PPM not on Coumadin History of left lower extremity DVT 2013-on aspirin HTN Prior falls and posttraumatic wound infection 10/2012  Suffered fall and underwent repair of comminuted complex intra-articular distal humeral fracture left upper extremity 03/29/15 and internal medicine consulted for assistance with management       Subjective:   Fair No fever chills n/v cp overnight No cough  Some soft stool but no overt diarrhea Pain in L hand manageable   Assessment  & Plan :     1. Fall related left elbow fracture, status post ORIF by Dr. Amedeo Plenty on 03/29/2015, seems to have tolerated the procedure well. Minimal perioperative blood loss related anemia not requiring any transfusion. Added heparin for DVT prophylaxis further management defer to primary team. On cefazolin per primary team, if low-grade fevers continue we'll defer the reevaluation of the postop site to hand/ortho  2. Anemia of chronic disease, iron deficiency and perioperative blood loss related anemia. Monitor H&H no need for transfusion. Placed on oral iron-would continue this on discharge to facility . Recommend anemia panel inclusive of  iron level as well as saturation ratios as an outpatient to determine if he needs IV iron in addition to by mouth as well as Aranesp. This can be monitored by his oncologist as an outpatient Dr. Alen Blew  3. Paroxysmal atrial fibrillation with pacemaker and [place] Vasc 2 score of greater than 3. On aspirin at home, no rate controlling agents, monitor. No acute issues. Not on anticoagulation chronically, defer to primary care physician and primary cardiologist on long-term anticoagulation.  4. History of left lower leg DVT. Placed on heparin for DVT prophylaxis, on aspirin daily continue. Also on SCDs in hospital  5. Essential hypertension. Currently on ACE inhibitor and Lasix continued.  6. Low-grade fever morning of 03/30/2015 and 04/01/2015. Chest x-ray and UA stable-urine culture was not suggestive of infection IS added, no cough no shortness  of breath or dysuria. Monitor. Likely reactive after surgery with possible mild atelectasis.  Nontoxic appearance.  No other source-If fevers recur will obtain blood cultures, will request primary team to reevaluate postop sit Would recommend discontinuation of antibiotics on d/c home   No further recommendations regarding medications and would continue prior medications Please let me know if there is any further assistance that she may require regarding this patient.  Once again if patient is stable we will sign off.   DVT Prophylaxis  :   Heparin   Disposition - SNF  Consult - Dr Amedeo Plenty  Procedure - ORIF L humerus 03-29-15    Lab Results  Component Value Date   PLT 171 04/02/2015    Inpatient Medications  Scheduled Meds: . aspirin EC  81 mg Oral Daily  .  ceFAZolin (ANCEF) IV  1 g Intravenous 3 times per day  . cholecalciferol  1,000 Units Oral Daily  . ferrous sulfate  325 mg Oral BID WC  . furosemide  40 mg Oral Daily  . heparin subcutaneous  5,000 Units Subcutaneous 3 times per day  . lisinopril  10 mg Oral BID  . oxybutynin  15 mg  Oral QODAY  . pantoprazole  40 mg Oral Daily  . senna  1 tablet Oral BID  . umeclidinium-vilanterol  1 puff Inhalation Daily  . vitamin B-12  500 mcg Oral Daily   Continuous Infusions:   PRN Meds:.acetaminophen, ALPRAZolam, furosemide, magnesium citrate, morphine injection, [DISCONTINUED] ondansetron **OR** ondansetron (ZOFRAN) IV, oxyCODONE  Antibiotics  :     Anti-infectives    Start     Dose/Rate Route Frequency Ordered Stop   03/29/15 2000  ceFAZolin (ANCEF) IVPB 1 g/50 mL premix     1 g 100 mL/hr over 30 Minutes Intravenous 3 times per day 03/29/15 1130     03/29/15 1145  ceFAZolin (ANCEF) IVPB 1 g/50 mL premix     1 g 100 mL/hr over 30 Minutes Intravenous NOW 03/29/15 1130 03/29/15 1213   03/29/15 0700  ceFAZolin (ANCEF) IVPB 2 g/50 mL premix  Status:  Discontinued     2 g 100 mL/hr over 30 Minutes Intravenous To [facility] 03/28/15 1005 03/28/15 1633        Objective:   Filed Vitals:   04/01/15 0456 04/01/15 1300 04/01/15 2300 04/02/15 0300  BP: 136/49 112/42 122/41 115/54  Pulse: 64 75 62 64  Temp: 99.9 F (37.7 C) 98.4 F (36.9 C) 100 F (37.8 C) 98.6 F (37 C)  TempSrc: Oral  Oral Oral  Resp: 18 18 18 18   Height:      Weight:      SpO2: 96% 99% 95% 96%    Wt Readings from Last 3 Encounters:  03/29/15 81.7 kg (180 lb 1.9 oz)  03/12/15 81.647 kg (180 lb)  01/14/15 82.101 kg (181 lb)     Intake/Output Summary (Last 24 hours) at 04/02/15 1108 Last data filed at 04/02/15 0548  Gross per 24 hour  Intake    700 ml  Output    500 ml  Net    200 ml     Physical Exam  Awake Alert, Oriented X 3, No new F.N deficits, Normal affect [place].AT,PERRAL Supple Neck,No JVD, No cervical lymphadenopathy appriciated.  Symmetrical Chest wall movement, Good air movement bilaterally, CTAB RRR,No Gallops,Rubs or new Murmurs, No Parasternal Heave +ve B.Sounds, Abd Soft, No tenderness, No organomegaly appriciated, No rebound - guarding or rigidity. No Cyanosis,  Clubbing or edema, No  new Rash or bruise, left elbow and cast and sling, drain in place, L hand swollen    Data Review:   Micro Results Recent Results (from the past 240 hour(s))  Urine culture     Status: None   Collection Time: 03/30/15  8:35 AM  Result Value Ref Range Status   Specimen Description URINE, CLEAN CATCH  Final   Special Requests NONE  Final   Culture 9,000 COLONIES/mL INSIGNIFICANT GROWTH  Final   Report Status 03/31/2015 FINAL  Final    Radiology Reports Dg Lumbar Spine Complete  03/12/2015  CLINICAL DATA:  Pain following fall EXAM: LUMBAR SPINE - COMPLETE 4+ VIEW COMPARISON:  Lumbar spine CT March 29, 2014 FINDINGS: Frontal, lateral, spot lumbosacral lateral, and bilateral oblique views were obtained. The there are 5 non-rib-bearing lumbar type vertebral bodies. There is thoracolumbar dextroscoliosis with rotatory component. There is no acute fracture or spondylolisthesis. There is remodeling of the T12 and L1 vertebral bodies due to chronic scoliosis, stable. There is moderate disc narrowing at all levels with somewhat more severe disc space narrowing at T12-L1, L1-2, and L2-3. There are anterior osteophytes at all levels. There is facet osteoarthritic change at all levels bilaterally. There is atherosclerotic calcification in the aorta. IMPRESSION: Scoliosis with multilevel arthropathy. No acute fracture or spondylolisthesis. Atherosclerotic calcification in aorta. There is not appear to be significant interval change compared to prior CT. Electronically Signed   By: Lowella Grip III M.D.   On: 03/12/2015 17:46   Dg Elbow Complete Left  03/12/2015  CLINICAL DATA:  Status post fall.  Left elbow pain. EXAM: LEFT ELBOW - COMPLETE 3+ VIEW COMPARISON:  None. FINDINGS: There is a mildly displaced transcondylar left elbow fracture with mild lateral displacement measuring 9 mm. There appears to be intra-articular extension of the fracture with a vertically oriented component.  Soft tissues are unremarkable. IMPRESSION: Mildly displaced transcondylar left elbow fracture with mild lateral displacement measuring 9 mm. There appears to be intra-articular extension of the fracture with a vertically oriented component. Electronically Signed   By: Kathreen Devoid   On: 03/12/2015 13:13   Dg Tibia/fibula Right  03/12/2015  CLINICAL DATA:  Pain following fall EXAM: RIGHT TIBIA AND FIBULA - 2 VIEW COMPARISON:  Right knee September 02, 2008 FINDINGS: Frontal and lateral views were obtained. The patient is status post total knee replacement with femoral and tibial prosthetic components appearing well-seated. There is a small joint effusion. There is bony overgrowth along the superior patella, stable. There is no acute fracture or dislocation. There is osteoarthritic change in the ankle joint. There are scattered small phleboliths. There is also arterial vascular calcification. IMPRESSION: Total knee replacement with prosthetic components in this region well-seated. Stable bony overgrowth along the superior patella. No acute fracture or dislocation. Mild osteoarthritic change in the ankle joint. Arterial vascular calcification noted. Electronically Signed   By: Lowella Grip III M.D.   On: 03/12/2015 17:41   Dg Chest Port 1 View  04/01/2015  CLINICAL DATA:  Low grade fever.  Shortness of breath. EXAM: PORTABLE CHEST 1 VIEW COMPARISON:  03/30/2015 FINDINGS: Cardiac pacemaker. Shallow inspiration. Normal heart size and pulmonary vascularity. No focal airspace disease or consolidation in the lungs. No blunting of costophrenic angles. No pneumothorax. Mediastinal contours appear intact. Tortuous aorta. Degenerative changes in the shoulders. IMPRESSION: No active disease. Electronically Signed   By: Lucienne Capers M.D.   On: 04/01/2015 06:53   Dg Chest Port 1 View  03/30/2015  CLINICAL DATA:  One day history of shortness of breath EXAM: PORTABLE CHEST 1 VIEW COMPARISON:  October 07, 2014 FINDINGS:  There is no demonstrable edema or consolidation. Heart size and pulmonary vascularity are within normal limits. Pacemaker leads are attached to the right atrium and right ventricle. No adenopathy. There old healed rib fractures on the left and right sides. IMPRESSION: No edema or consolidation. Electronically Signed   By: Lowella Grip III M.D.   On: 03/30/2015 08:03   Dg Shoulder Left  03/12/2015  CLINICAL DATA:  Left shoulder pain following a fall today. EXAM: LEFT SHOULDER - 2+ VIEW COMPARISON:  Left shoulder CT arthrogram dated 10/10/2007. FINDINGS: There is superior migration of the humeral head with glenohumeral joint space narrowing and inferior spur formation. No fracture or dislocation is seen. A left subclavian dual lead pacemaker is noted. IMPRESSION: 1. No fracture or dislocation. 2. Glenohumeral joint degenerative changes. 3. Superior migration of the humeral head compatible with a chronic rotator cuff tear. Electronically Signed   By: Claudie Revering M.D.   On: 03/12/2015 13:16   Dg Hip Unilat With Pelvis 2-3 Views Left  03/12/2015  CLINICAL DATA:  Pain following fall EXAM: DG HIP (WITH OR WITHOUT PELVIS) 2-3V LEFT COMPARISON:  None. FINDINGS: Frontal pelvis as well as frontal and lateral left hip images were obtained of. There is no demonstrable acute fracture or dislocation. There is mild symmetric narrowing of both hip joints. No erosive change. Postoperative change is noted in the lower pelvis. IMPRESSION: Mild symmetric narrowing both hip joints. No fracture or dislocation. Electronically Signed   By: Lowella Grip III M.D.   On: 03/12/2015 17:48     CBC  Recent Labs Lab 03/29/15 0719 03/30/15 0301 03/31/15 0357 04/01/15 0845 04/02/15 0601  WBC 5.6 8.3 7.5 7.1 5.5  HGB 8.1* 8.5* 8.0* 8.5* 7.6*  HCT 25.6* 26.9* 25.4* 26.9* 23.4*  PLT 206 214 164 202 171  MCV 95.2 93.7 95.1 93.4 93.6  MCH 30.1 29.6 30.0 29.5 30.4  MCHC 31.6 31.6 31.5 31.6 32.5  RDW 14.3 14.5 14.7 14.2  14.4  LYMPHSABS  --  0.9 0.9 0.9 0.9  MONOABS  --  0.4 0.5 0.4 0.4  EOSABS  --  0.0 0.1 0.1 0.1  BASOSABS  --  0.0 0.0 0.0 0.0    Chemistries   Recent Labs Lab 03/29/15 0719 03/30/15 0301 03/31/15 0357  NA 138 137 135  K 4.2 4.0 3.7  CL 105 103 102  CO2 23 26 25   GLUCOSE 111* 122* 111*  BUN 28* 18 22*  CREATININE 1.08 1.02 1.08  CALCIUM 9.1 8.5* 8.3*   ------------------------------------------------------------------------------------------------------------------ No results for input(s): CHOL, HDL, LDLCALC, TRIG, CHOLHDL, LDLDIRECT in the last 72 hours.  No results found for: HGBA1C ------------------------------------------------------------------------------------------------------------------ No results for input(s): TSH, T4TOTAL, T3FREE, THYROIDAB in the last 72 hours.  Invalid input(s): FREET3 ------------------------------------------------------------------------------------------------------------------ No results for input(s): VITAMINB12, FOLATE, FERRITIN, TIBC, IRON, RETICCTPCT in the last 72 hours.  Coagulation profile No results for input(s): INR, PROTIME in the last 168 hours.  No results for input(s): DDIMER in the last 72 hours.  Cardiac Enzymes No results for input(s): CKMB, TROPONINI, MYOGLOBIN in the last 168 hours.  Invalid input(s): CK ------------------------------------------------------------------------------------------------------------------ No results found for: BNP  Time Spent in minutes   35  Verneita Griffes, MD [facility] [phone]

## 2015-04-02 NOTE — Clinical Social Work Note (Signed)
Patient to be d/c'ed today to [facility].  Patient and family agreeable to plans will transport via ems RN to call report to [phone] .  Evette Cristal, MSW, [place]

## 2015-04-02 NOTE — Progress Notes (Signed)
Report called to [facility]. 

## 2015-04-03 ENCOUNTER — Non-Acute Institutional Stay (SKILLED_NURSING_FACILITY): Payer: Medicare Other | Admitting: Internal Medicine

## 2015-04-03 ENCOUNTER — Encounter: Payer: Self-pay | Admitting: Internal Medicine

## 2015-04-03 DIAGNOSIS — E785 Hyperlipidemia, unspecified: Secondary | ICD-10-CM | POA: Diagnosis not present

## 2015-04-03 DIAGNOSIS — K219 Gastro-esophageal reflux disease without esophagitis: Secondary | ICD-10-CM | POA: Diagnosis not present

## 2015-04-03 DIAGNOSIS — I482 Chronic atrial fibrillation, unspecified: Secondary | ICD-10-CM

## 2015-04-03 DIAGNOSIS — S42402D Unspecified fracture of lower end of left humerus, subsequent encounter for fracture with routine healing: Secondary | ICD-10-CM

## 2015-04-03 DIAGNOSIS — D649 Anemia, unspecified: Secondary | ICD-10-CM | POA: Diagnosis not present

## 2015-04-03 DIAGNOSIS — G4733 Obstructive sleep apnea (adult) (pediatric): Secondary | ICD-10-CM | POA: Insufficient documentation

## 2015-04-03 DIAGNOSIS — J438 Other emphysema: Secondary | ICD-10-CM

## 2015-04-03 DIAGNOSIS — Z950 Presence of cardiac pacemaker: Secondary | ICD-10-CM | POA: Diagnosis not present

## 2015-04-03 DIAGNOSIS — I10 Essential (primary) hypertension: Secondary | ICD-10-CM | POA: Diagnosis not present

## 2015-04-03 NOTE — Assessment & Plan Note (Signed)
SNF h/o; pt has RR

## 2015-04-03 NOTE — Assessment & Plan Note (Signed)
SNF - cont omeprazole 20 mg daily

## 2015-04-03 NOTE — Assessment & Plan Note (Signed)
SNF - cont anora ellipta

## 2015-04-03 NOTE — Assessment & Plan Note (Signed)
SNF - controlled with Lisinopril 20 mg and lasix 40 mg daily

## 2015-04-03 NOTE — Assessment & Plan Note (Signed)
SNF - not stated as uncontrolled;cont omega 3 fish oil

## 2015-04-03 NOTE — Progress Notes (Signed)
MRN: [id number] Name: [name] [name]  Sex: male Age: 82 y.o. DOB: [birth year]  [place] #: Karren Burly Facility/Room:116 Level Of Care: SNF Provider: Inocencio Homes D Emergency Contacts: Extended Emergency Contact Information Primary Emergency Contact: Heaphy,[name] Address: [address]          [place], [place] [address] [name] of [place] Phone: [phone] Mobile Phone: [phone] Relation: Spouse Secondary Emergency Contact: Cathie Beams, [place] [address] [name] of [place] Phone: [phone] Mobile Phone: [phone] Relation: Son  Code Status:   Allergies: Review of patient's allergies indicates no known allergies.  Chief Complaint  Patient presents with  . New Admit To SNF    HPI: Patient is 82 y.o. male with chronic anemia, COPD, history of pacemaker, history of atrial fibrillation, DVT, hypertension who was admitted to [facility] fro 2/11-15 where he underwent open reduction internal fixation of his left distal humerus fracture with ulnar nerve decompression and transposition. The patient was then admitted to the orthopedic unit for close observation, cardiac monitoring given his history of PAF, pain management and IV antibiotics. There were no complications other than pt's missed dose of arenesp.Pt is admitted to SNF with generalized weakness fot OT/PT. While at SNF he will be followed for HTN, tx with lasix and lisinopril, HLD, tx with fish oil and Anemia, tx withiron, b12 and aranesp.  Past Medical History  Diagnosis Date  . Hypertension   . Hearing loss   . Leg swelling 10/19/2011    LLE  . COPD (chronic obstructive pulmonary disease) ([place]) 10/19/2011    "touch"  . Exertional dyspnea 10/19/2011  . OSA on CPAP 10/19/2011    "sometimes wear CPAP"  . Prostate cancer ([place])     S/P radiation tx ~ 2008  . History of blood transfusion     "think it was related to pacemaker placement"  . GERD (gastroesophageal reflux disease)   .  Arthritis     "back"  . Chronic lower back pain   . Skin change     12-24-14 few scalp lesions burned off"old age spots"  . Anemia     occ. injections for this at [facility]  . Pacemaker     Medtronic    Past Surgical History  Procedure Laterality Date  . Hernia repair  ~ 2010;     lap; VHR  . Hernia repair  ~ [id number]    lap vh complicated by recurrence  . Hernia repair  09/2011    open; VHR  . Insert / replace / remove pacemaker  ~ 2009    initial placement  . Cataract extraction w/ intraocular lens  implant, bilateral    . Joint replacement    . Total knee arthroplasty  1990's    right  . I&d extremity Left 10/20/2012    Procedure: LEFT LEG IRRIGATION AND DEBRIDEMENT, AND WOUND VAC APPLICATION;  Surgeon: Marianna Payment, MD;  [facility];  Service: Orthopedics;  Laterality: Left;  . I&d extremity Left 10/22/2012    Procedure: IRRIGATION AND DEBRIDEMENT EXTREMITY;  Surgeon: Marianna Payment, MD;  [facility];  Service: Orthopedics;  Laterality: Left;  . Skin split graft Left 10/22/2012    Procedure: SKIN GRAFT SPLIT THICKNESS;  Surgeon: Marianna Payment, MD;  [facility];  Service: Orthopedics;  Laterality: Left;  . Carpal tunnel release Left 11/14/2014    Procedure: CARPAL TUNNEL RELEASE LEFT THUMB;  Surgeon: Daryll Brod, MD;  Location: [facility];  Service: Orthopedics;  Laterality: Left;  ANESTHESIA:  IV REGIONAL FAB  . Esophagogastroduodenoscopy (egd) with propofol N/A 01/14/2015    Procedure: ESOPHAGOGASTRODUODENOSCOPY (EGD) WITH PROPOFOL;  Surgeon: Garlan Fair, MD;  [facility];  Service: Endoscopy;  Laterality: N/A;  . Balloon dilation N/A 01/14/2015    Procedure: BALLOON DILATION;  Surgeon: Garlan Fair, MD;  [facility];  Service: Endoscopy;  Laterality: N/A;  . Cardiac catheterization    . Eye surgery Bilateral     cataract  . Cholecystectomy  10/23/2011  . Cholecystectomy  10/23/2011    Procedure:  CHOLECYSTECTOMY;  Surgeon: Rolm Bookbinder, MD;  Location: [facility];  Service: General;  Laterality: N/A;  with intraoperative cholangiogram  . Orif elbow fracture Left 03/29/2015    Procedure: LEFT ELBOW OPEN REDUCTION INTERNAL FIXATION (ORIF) DISTAL HUMERUS FRACTURE WITH OLECRANON OSTEOTOMY AND ULNAR NERVE RELEASE;  Surgeon: Roseanne Kaufman, MD;  Location: [facility];  Service: Orthopedics;  Laterality: Left;      Medication List       This list is accurate as of: 04/03/15  6:56 PM.  Always use your most recent med list.               ANORO ELLIPTA 62.5-25 MCG/INH Aepb  Generic drug:  umeclidinium-vilanterol  Inhale 1 puff into the lungs daily.     ARANESP (ALB FREE) SURECLICK XX123456 MCG/ML injection  Generic drug:  darbepoetin alfa-polysorbate  Inject 500 mcg into the skin See admin instructions. Every 2 months     aspirin EC 81 MG tablet  Take 81 mg by mouth daily.     Calcium 600-200 MG-UNIT tablet  Take 1 tablet by mouth 2 (two) times daily.     cholecalciferol 1000 units tablet  Commonly known as:  VITAMIN D  Take 1,000 Units by mouth daily.     ferrous sulfate 325 (65 FE) MG tablet  Take 1 tablet (325 mg total) by mouth 2 (two) times daily with a meal.     Fish Oil 1200 MG Caps  Take 1 capsule by mouth daily.     furosemide 40 MG tablet  Commonly known as:  LASIX  Take 40-80 mg by mouth See admin instructions. Take 40mg  daily.  May take an additional 40mg  for leg swelling     lisinopril 20 MG tablet  Commonly known as:  PRINIVIL,ZESTRIL  Take 10 mg by mouth 2 (two) times daily.     omeprazole 20 MG capsule  Commonly known as:  PRILOSEC  Take 20 mg by mouth daily.     oxybutynin 15 MG 24 hr tablet  Commonly known as:  DITROPAN XL  Take 15 mg by mouth every other day.     oxyCODONE 5 MG immediate release tablet  Commonly known as:  Oxy IR/ROXICODONE  Take 1-2 tablets (5-10 mg total) by mouth every 3 (three) hours as needed for moderate pain.     PRESERVISION AREDS  PO  Take 1 tablet by mouth 2 (two) times daily.     vitamin B-12 500 MCG tablet  Commonly known as:  CYANOCOBALAMIN  Take 500 mcg by mouth daily.        No orders of the defined types were placed in this encounter.    Immunization History  Administered Date(s) Administered  . Influenza,inj,Quad PF,36+ Mos 10/16/2012  . Influenza-Unspecified 11/15/2013  . Pneumococcal Conjugate-13 06/03/2014  . Pneumococcal-Unspecified 05/04/2013  . Tdap 10/05/2012  . Zoster 02/16/2011    Social  History  Substance Use Topics  . Smoking status: Former Smoker -- 1.50 packs/day for 35 years    Types: Cigarettes    Quit date: 02/15/1973  . Smokeless tobacco: Never Used  . Alcohol Use: No    Family history is +HD, stroke  Review of Systems  DATA OBTAINED: from patient GENERAL:  no fevers, fatigue, appetite changes SKIN: No itching, rash or wounds EYES: No eye pain, redness, discharge EARS: No earache, tinnitus, change in hearing NOSE: No congestion, drainage or bleeding  MOUTH/THROAT: No mouth or tooth pain, No sore throat RESPIRATORY: No cough, wheezing, SOB CARDIAC: No chest pain, palpitations, lower extremity edema  GI: No abdominal pain, No N/V/D or constipation, No heartburn or reflux  GU: No dysuria, frequency or urgency, or incontinence  MUSCULOSKELETAL: No unrelieved bone/joint pain NEUROLOGIC: No headache, dizziness or focal weakness PSYCHIATRIC: No c/o anxiety or sadness   Filed Vitals:   04/03/15 1830  BP: 122/65  Pulse: 74  Temp: 97.6 F (36.4 C)  Resp: 16    SpO2 Readings from Last 1 Encounters:  04/02/15 96%        Physical Exam  GENERAL APPEARANCE: Alert, conversant,  No acute distress.  SKIN: No diaphoresis rash HEAD: Normocephalic, atraumatic  EYES: Conjunctiva/lids clear. Pupils round, reactive. EOMs intact.  EARS: External exam WNL, canals clear. Hearing grossly normal.  NOSE: No deformity or discharge.  MOUTH/THROAT: Lips w/o lesions   RESPIRATORY: Breathing is even, unlabored. Lung sounds are clear   CARDIOVASCULAR: Heart RRR no murmurs, rubs or gallops. No peripheral edema.   GASTROINTESTINAL: Abdomen is soft, non-tender, not distended w/ normal bowel sounds. GENITOURINARY: Bladder non tender, not distended  MUSCULOSKELETAL: L arm in sling and splint NEUROLOGIC:  Cranial nerves 2-12 grossly intact. Moves all extremities  PSYCHIATRIC: Mood and affect appropriate to situation, no behavioral issues  Patient Active Problem List   Diagnosis Date Noted  . GERD (gastroesophageal reflux disease) 04/03/2015  . OSA (obstructive sleep apnea) 04/03/2015  . HLD (hyperlipidemia) 04/03/2015  . SOB (shortness of breath)   . Left elbow fracture 03/29/2015  . DOE (dyspnea on exertion) 10/26/2013  . Essential hypertension, benign 10/26/2013  . Post-traumatic wound infection ([place]) 10/17/2012  . Cellulitis 10/17/2012  . Atrial fibrillation ([place]) 10/22/2011  . DVT, lower extremity, distal, chronic ([place]) 10/22/2011  . Pacemaker 10/19/2011  . Prostate cancer ([place]) 10/19/2011  . COPD (chronic obstructive pulmonary disease) with emphysema ([place]) 02/04/2011  . Anemia 12/10/2010    CBC    Component Value Date/Time   WBC 5.5 04/02/2015 0601   WBC 7.2 01/21/2015 1314   RBC 2.50* 04/02/2015 0601   RBC 3.50* 01/21/2015 1314   HGB 7.6* 04/02/2015 0601   HGB 10.7* 01/21/2015 1314   HCT 23.4* 04/02/2015 0601   HCT 32.9* 01/21/2015 1314   PLT 171 04/02/2015 0601   PLT 127* 01/21/2015 1314   MCV 93.6 04/02/2015 0601   MCV 93.9 01/21/2015 1314   LYMPHSABS 0.9 04/02/2015 0601   LYMPHSABS 1.1 01/21/2015 1314   MONOABS 0.4 04/02/2015 0601   MONOABS 0.4 01/21/2015 1314   EOSABS 0.1 04/02/2015 0601   EOSABS 0.1 01/21/2015 1314   BASOSABS 0.0 04/02/2015 0601   BASOSABS 0.0 01/21/2015 1314    CMP     Component Value Date/Time   NA 135 03/31/2015 0357   K 3.7 03/31/2015 0357   CL 102 03/31/2015 0357   CO2 25 03/31/2015 0357    GLUCOSE 111* 03/31/2015 0357   BUN 22* 03/31/2015 0357  CREATININE 1.08 03/31/2015 0357   CALCIUM 8.3* 03/31/2015 0357   PROT 6.5 10/17/2012 1555   ALBUMIN 3.2* 10/17/2012 1555   AST 21 10/17/2012 1555   ALT 11 10/17/2012 1555   ALKPHOS 83 10/17/2012 1555   BILITOT 0.4 10/17/2012 1555   GFRNONAA 59* 03/31/2015 0357   GFRAA >60 03/31/2015 0357    No results found for: HGBA1C   Dg Chest Port 1 View  03/30/2015  CLINICAL DATA:  One day history of shortness of breath EXAM: PORTABLE CHEST 1 VIEW COMPARISON:  October 07, 2014 FINDINGS: There is no demonstrable edema or consolidation. Heart size and pulmonary vascularity are within normal limits. Pacemaker leads are attached to the right atrium and right ventricle. No adenopathy. There old healed rib fractures on the left and right sides. IMPRESSION: No edema or consolidation. Electronically Signed   By: Lowella Grip III M.D.   On: 03/30/2015 08:03    Not all labs, radiology exams or other studies done during hospitalization come through on my EPIC note; however they are reviewed by me.    Assessment and Plan  Left elbow fracture 2/2 fall;highly comminuted and tx with ORIF and ulnar nerve decompression; SNF - admited for OT/PT  Essential hypertension, benign SNF - controlled with Lisinopril 20 mg and lasix 40 mg daily  Atrial fibrillation (HCC) SNF h/o; pt has RR  COPD (chronic obstructive pulmonary disease) with emphysema ([place]) SNF - cont anora ellipta  Anemia SNF - pre-op HB 8.5, post op 7.6; will follow with CBC; cont B12 and iron and set up pt for office visit for aranesp  GERD (gastroesophageal reflux disease) SNF - cont omeprazole 20 mg daily  OSA (obstructive sleep apnea) SNF - pt doesn't normally use CPAP  HLD (hyperlipidemia) SNF - not stated as uncontrolled;cont omega 3 fish oil   Time spent > 45 min;> 50% of time with patient was spent reviewing records, labs, tests and studies, counseling and developing  plan of care  Hennie Duos, MD

## 2015-04-03 NOTE — Assessment & Plan Note (Signed)
2/2 fall;highly comminuted and tx with ORIF and ulnar nerve decompression; SNF - admited for OT/PT

## 2015-04-03 NOTE — Assessment & Plan Note (Signed)
SNF - pre-op HB 8.5, post op 7.6; will follow with CBC; cont B12 and iron and set up pt for office visit for aranesp

## 2015-04-03 NOTE — Assessment & Plan Note (Signed)
SNF - pt doesn't normally use CPAP

## 2015-04-10 ENCOUNTER — Encounter: Payer: Self-pay | Admitting: Internal Medicine

## 2015-04-10 ENCOUNTER — Non-Acute Institutional Stay (SKILLED_NURSING_FACILITY): Payer: Medicare Other | Admitting: Internal Medicine

## 2015-04-10 DIAGNOSIS — F419 Anxiety disorder, unspecified: Secondary | ICD-10-CM

## 2015-04-10 NOTE — Progress Notes (Signed)
MRN: [id number] Name: [name] [name]  Sex: male Age: 82 y.o. DOB: [birth year]  [place] #: Karren Burly Facility/Room: Level Of Care: SNF Provider: Inocencio Homes D Emergency Contacts: Extended Emergency Contact Information Primary Emergency Contact: Leis,[name] Address: [address]          [place], [place] [address] [name] of [place] Phone: [phone] Mobile Phone: [phone] Relation: Spouse Secondary Emergency Contact: Cathie Beams, [place] [address] [name] of [place] Phone: [phone] Mobile Phone: [phone] Relation: Son  Code Status:   Allergies: Review of patient's allergies indicates no known allergies.  Chief Complaint  Patient presents with  . Acute Visit    HPI: Patient is 82 y.o. male who is at SNF for rehab of an elbow fracture who wanted to speak to me about his nerves and sleeping.I told him he was not prescribed any medication for nerves but he tells me he uses his wife's valium at home. From what he says it sounds like he has been using it daily. He c/o that he can't sleep at SNF and want something for that too. Nursing had told me that he wants valium and ambien.  Past Medical History  Diagnosis Date  . Hypertension   . Hearing loss   . Leg swelling 10/19/2011    LLE  . COPD (chronic obstructive pulmonary disease) ([place]) 10/19/2011    "touch"  . Exertional dyspnea 10/19/2011  . OSA on CPAP 10/19/2011    "sometimes wear CPAP"  . Prostate cancer ([place])     S/P radiation tx ~ 2008  . History of blood transfusion     "think it was related to pacemaker placement"  . GERD (gastroesophageal reflux disease)   . Arthritis     "back"  . Chronic lower back pain   . Skin change     12-24-14 few scalp lesions burned off"old age spots"  . Anemia     occ. injections for this at [facility]  . Pacemaker     Medtronic    Past Surgical History  Procedure Laterality Date  . Hernia repair  ~ 2010;     lap; VHR  .  Hernia repair  ~ [id number]    lap vh complicated by recurrence  . Hernia repair  09/2011    open; VHR  . Insert / replace / remove pacemaker  ~ 2009    initial placement  . Cataract extraction w/ intraocular lens  implant, bilateral    . Joint replacement    . Total knee arthroplasty  1990's    right  . I&d extremity Left 10/20/2012    Procedure: LEFT LEG IRRIGATION AND DEBRIDEMENT, AND WOUND VAC APPLICATION;  Surgeon: Marianna Payment, MD;  [facility];  Service: Orthopedics;  Laterality: Left;  . I&d extremity Left 10/22/2012    Procedure: IRRIGATION AND DEBRIDEMENT EXTREMITY;  Surgeon: Marianna Payment, MD;  [facility];  Service: Orthopedics;  Laterality: Left;  . Skin split graft Left 10/22/2012    Procedure: SKIN GRAFT SPLIT THICKNESS;  Surgeon: Marianna Payment, MD;  [facility];  Service: Orthopedics;  Laterality: Left;  . Carpal tunnel release Left 11/14/2014    Procedure: CARPAL TUNNEL RELEASE LEFT THUMB;  Surgeon: Daryll Brod, MD;  Location: [facility];  Service: Orthopedics;  Laterality: Left;  ANESTHESIA:  IV REGIONAL FAB  . Esophagogastroduodenoscopy (egd) with propofol N/A 01/14/2015    Procedure: ESOPHAGOGASTRODUODENOSCOPY (EGD) WITH PROPOFOL;  Surgeon: Garlan Fair, MD;  Location: [facility];  Service: Endoscopy;  Laterality: N/A;  . Balloon dilation N/A 01/14/2015    Procedure: BALLOON DILATION;  Surgeon: Garlan Fair, MD;  [facility];  Service: Endoscopy;  Laterality: N/A;  . Cardiac catheterization    . Eye surgery Bilateral     cataract  . Cholecystectomy  10/23/2011  . Cholecystectomy  10/23/2011    Procedure: CHOLECYSTECTOMY;  Surgeon: Rolm Bookbinder, MD;  Location: [facility];  Service: General;  Laterality: N/A;  with intraoperative cholangiogram  . Orif elbow fracture Left 03/29/2015    Procedure: LEFT ELBOW OPEN REDUCTION INTERNAL FIXATION (ORIF) DISTAL HUMERUS FRACTURE WITH OLECRANON OSTEOTOMY AND ULNAR NERVE RELEASE;   Surgeon: Roseanne Kaufman, MD;  Location: [facility];  Service: Orthopedics;  Laterality: Left;      Medication List       This list is accurate as of: 04/10/15 11:59 PM.  Always use your most recent med list.               ANORO ELLIPTA 62.5-25 MCG/INH Aepb  Generic drug:  umeclidinium-vilanterol  Inhale 1 puff into the lungs daily.     ARANESP (ALB FREE) SURECLICK [id number] MCG/ML injection  Generic drug:  darbepoetin alfa-polysorbate  Inject 500 mcg into the skin See admin instructions. Every 2 months     aspirin EC 81 MG tablet  Take 81 mg by mouth daily.     Calcium 600-200 MG-UNIT tablet  Take 1 tablet by mouth 2 (two) times daily.     cholecalciferol 1000 units tablet  Commonly known as:  VITAMIN D  Take 1,000 Units by mouth daily.     ferrous sulfate 325 (65 FE) MG tablet  Take 1 tablet (325 mg total) by mouth 2 (two) times daily with a meal.     Fish Oil 1200 MG Caps  Take 1 capsule by mouth daily.     furosemide 40 MG tablet  Commonly known as:  LASIX  Take 40-80 mg by mouth See admin instructions. Take 40mg  daily.  May take an additional 40mg  for leg swelling     lisinopril 20 MG tablet  Commonly known as:  PRINIVIL,ZESTRIL  Take 10 mg by mouth 2 (two) times daily.     omeprazole 20 MG capsule  Commonly known as:  PRILOSEC  Take 20 mg by mouth daily.     oxybutynin 15 MG 24 hr tablet  Commonly known as:  DITROPAN XL  Take 15 mg by mouth every other day.     oxyCODONE 5 MG immediate release tablet  Commonly known as:  Oxy IR/ROXICODONE  Take 1-2 tablets (5-10 mg total) by mouth every 3 (three) hours as needed for moderate pain.     PRESERVISION AREDS PO  Take 1 tablet by mouth 2 (two) times daily.     vitamin B-12 500 MCG tablet  Commonly known as:  CYANOCOBALAMIN  Take 500 mcg by mouth daily.        No orders of the defined types were placed in this encounter.    Immunization History  Administered Date(s) Administered  . Influenza,inj,Quad PF,36+  Mos 10/16/2012  . Influenza-Unspecified 11/15/2013  . Pneumococcal Conjugate-13 06/03/2014  . Pneumococcal-Unspecified 05/04/2013  . Tdap 10/05/2012  . Zoster 02/16/2011    Social History  Substance Use Topics  . Smoking status: Former Smoker -- 1.50 packs/day for 35 years    Types: Cigarettes    Quit date: 02/15/1973  . Smokeless tobacco: Never  Used  . Alcohol Use: No    Review of Systems  DATA OBTAINED: from patient, nurse - as per HPI GENERAL:  no fevers, fatigue, appetite changes SKIN: No itching, rash HEENT: No complaint RESPIRATORY: No cough, wheezing, SOB CARDIAC: No chest pain, palpitations, lower extremity edema  GI: No abdominal pain, No N/V/D or constipation, No heartburn or reflux  GU: No dysuria, frequency or urgency, or incontinence  MUSCULOSKELETAL: No unrelieved bone/joint pain NEUROLOGIC: No headache, dizziness  PSYCHIATRIC: some anxiety, some inability to sleep  Filed Vitals:   04/10/15 2128  BP: 122/65  Pulse: 74  Temp: 97.6 F (36.4 C)  Resp: 16    Physical Exam  GENERAL APPEARANCE: Alert, conversant, No acute distress  SKIN: No diaphoresis rash HEENT: Unremarkable RESPIRATORY: Breathing is even, unlabored. Lung sounds are clear   CARDIOVASCULAR: Heart RRR no murmurs, rubs or gallops. No peripheral edema  GASTROINTESTINAL: Abdomen is soft, non-tender, not distended w/ normal bowel sounds.  GENITOURINARY: Bladder non tender, not distended  MUSCULOSKELETAL: sling and slpint arm NEUROLOGIC: Cranial nerves 2-12 grossly intact. Moves all extremities PSYCHIATRIC: Mood and affect appropriate to situation with some dementia, no behavioral issues  Patient Active Problem List   Diagnosis Date Noted  . Anxiety 04/14/2015  . GERD (gastroesophageal reflux disease) 04/03/2015  . OSA (obstructive sleep apnea) 04/03/2015  . HLD (hyperlipidemia) 04/03/2015  . SOB (shortness of breath)   . Left elbow fracture 03/29/2015  . DOE (dyspnea on exertion)  10/26/2013  . Essential hypertension, benign 10/26/2013  . Post-traumatic wound infection ([place]) 10/17/2012  . Cellulitis 10/17/2012  . Atrial fibrillation ([place]) 10/22/2011  . DVT, lower extremity, distal, chronic ([place]) 10/22/2011  . Pacemaker 10/19/2011  . Prostate cancer ([place]) 10/19/2011  . COPD (chronic obstructive pulmonary disease) with emphysema ([place]) 02/04/2011  . Anemia 12/10/2010    CBC    Component Value Date/Time   WBC 5.5 04/02/2015 0601   WBC 7.2 01/21/2015 1314   RBC 2.50* 04/02/2015 0601   RBC 3.50* 01/21/2015 1314   HGB 7.6* 04/02/2015 0601   HGB 10.7* 01/21/2015 1314   HCT 23.4* 04/02/2015 0601   HCT 32.9* 01/21/2015 1314   PLT 171 04/02/2015 0601   PLT 127* 01/21/2015 1314   MCV 93.6 04/02/2015 0601   MCV 93.9 01/21/2015 1314   LYMPHSABS 0.9 04/02/2015 0601   LYMPHSABS 1.1 01/21/2015 1314   MONOABS 0.4 04/02/2015 0601   MONOABS 0.4 01/21/2015 1314   EOSABS 0.1 04/02/2015 0601   EOSABS 0.1 01/21/2015 1314   BASOSABS 0.0 04/02/2015 0601   BASOSABS 0.0 01/21/2015 1314    CMP     Component Value Date/Time   NA 135 03/31/2015 0357   K 3.7 03/31/2015 0357   CL 102 03/31/2015 0357   CO2 25 03/31/2015 0357   GLUCOSE 111* 03/31/2015 0357   BUN 22* 03/31/2015 0357   CREATININE 1.08 03/31/2015 0357   CALCIUM 8.3* 03/31/2015 0357   PROT 6.5 10/17/2012 1555   ALBUMIN 3.2* 10/17/2012 1555   AST 21 10/17/2012 1555   ALT 11 10/17/2012 1555   ALKPHOS 83 10/17/2012 1555   BILITOT 0.4 10/17/2012 1555   GFRNONAA 59* 03/31/2015 0357   GFRAA >60 03/31/2015 0357    Assessment and Plan  Anxiety I wasn't happy with anything that pt told about what he was using and I was not going to use ambien on him, and as he was used to Valium I decided  5 mg at night would wok best for anxiety and sleep.  He asked for valium in the daytime and I told him valium at night would be enough and that valium in he day might lead to a fall so he defered.   Time spent 25 min;> 50% of  time with patient was spent reviewing records, labs, tests and studies, counseling and developing plan of care  Hennie Duos, MD

## 2015-04-11 ENCOUNTER — Telehealth: Payer: Self-pay | Admitting: Oncology

## 2015-04-11 NOTE — Telephone Encounter (Signed)
Nurse Hinton Dyer from the rehab facility pt is staying called to schedule labs/inj that pt missed r/s schedule apts.... [name] [name]

## 2015-04-14 ENCOUNTER — Encounter: Payer: Self-pay | Admitting: Internal Medicine

## 2015-04-14 DIAGNOSIS — F419 Anxiety disorder, unspecified: Secondary | ICD-10-CM | POA: Insufficient documentation

## 2015-04-14 NOTE — Assessment & Plan Note (Signed)
I wasn't happy with anything that pt told about what he was using and I was not going to use ambien on him, and as he was used to Valium I decided  5 mg at night would wok best for anxiety and sleep. He asked for valium in the daytime and I told him valium at night would be enough and that valium in he day might lead to a fall so he defered.

## 2015-04-17 ENCOUNTER — Other Ambulatory Visit (HOSPITAL_BASED_OUTPATIENT_CLINIC_OR_DEPARTMENT_OTHER): Payer: Medicare Other

## 2015-04-17 ENCOUNTER — Ambulatory Visit (HOSPITAL_BASED_OUTPATIENT_CLINIC_OR_DEPARTMENT_OTHER): Payer: Medicare Other

## 2015-04-17 ENCOUNTER — Telehealth: Payer: Self-pay | Admitting: Oncology

## 2015-04-17 VITALS — BP 124/61 | HR 63 | Temp 98.2°F

## 2015-04-17 DIAGNOSIS — N189 Chronic kidney disease, unspecified: Secondary | ICD-10-CM

## 2015-04-17 DIAGNOSIS — D631 Anemia in chronic kidney disease: Secondary | ICD-10-CM

## 2015-04-17 DIAGNOSIS — K432 Incisional hernia without obstruction or gangrene: Secondary | ICD-10-CM

## 2015-04-17 DIAGNOSIS — J449 Chronic obstructive pulmonary disease, unspecified: Secondary | ICD-10-CM

## 2015-04-17 LAB — CBC WITH DIFFERENTIAL/PLATELET
BASO%: 0.4 % (ref 0.0–2.0)
BASOS ABS: 0 10*3/uL (ref 0.0–0.1)
EOS ABS: 0.2 10*3/uL (ref 0.0–0.5)
EOS%: 2.1 % (ref 0.0–7.0)
HCT: 33.3 % — ABNORMAL LOW (ref 38.4–49.9)
HGB: 10.8 g/dL — ABNORMAL LOW (ref 13.0–17.1)
LYMPH%: 16.5 % (ref 14.0–49.0)
MCH: 29.8 pg (ref 27.2–33.4)
MCHC: 32.5 g/dL (ref 32.0–36.0)
MCV: 91.7 fL (ref 79.3–98.0)
MONO#: 0.4 10*3/uL (ref 0.1–0.9)
MONO%: 5.4 % (ref 0.0–14.0)
NEUT#: 5.4 10*3/uL (ref 1.5–6.5)
NEUT%: 75.6 % — AB (ref 39.0–75.0)
PLATELETS: 222 10*3/uL (ref 140–400)
RBC: 3.63 10*6/uL — AB (ref 4.20–5.82)
RDW: 15.8 % — ABNORMAL HIGH (ref 11.0–14.6)
WBC: 7.1 10*3/uL (ref 4.0–10.3)
lymph#: 1.2 10*3/uL (ref 0.9–3.3)

## 2015-04-17 MED ORDER — DARBEPOETIN ALFA 300 MCG/0.6ML IJ SOSY
300.0000 ug | PREFILLED_SYRINGE | Freq: Once | INTRAMUSCULAR | Status: AC
Start: 2015-04-17 — End: 2015-04-17
  Administered 2015-04-17: 300 ug via SUBCUTANEOUS
  Filled 2015-04-17: qty 0.6

## 2015-04-17 NOTE — Telephone Encounter (Signed)
Gave and printed appt sched and avs for pt for March thru Aug

## 2015-05-07 ENCOUNTER — Non-Acute Institutional Stay (SKILLED_NURSING_FACILITY): Payer: Medicare Other | Admitting: Adult Health

## 2015-05-07 ENCOUNTER — Encounter: Payer: Self-pay | Admitting: Adult Health

## 2015-05-07 DIAGNOSIS — S42402D Unspecified fracture of lower end of left humerus, subsequent encounter for fracture with routine healing: Secondary | ICD-10-CM

## 2015-05-07 DIAGNOSIS — J438 Other emphysema: Secondary | ICD-10-CM | POA: Diagnosis not present

## 2015-05-07 DIAGNOSIS — I10 Essential (primary) hypertension: Secondary | ICD-10-CM

## 2015-05-07 NOTE — Progress Notes (Signed)
Patient ID: TUCK TIN, male   DOB: Sep 10, 1924, 82 y.o.   MRN: [id number]   Facility:  [facility]       No Known Allergies   Chief Complaint  Patient presents with  . Discharge Note    HPI:  He is being discharged to home with home health for pt/ot. He will not need dme; he tells me that he has all necessary equipment. He will need his prescriptions written and will need to follow up with his medical provider.    Past Medical History  Diagnosis Date  . Hypertension   . Hearing loss   . Leg swelling 10/19/2011    LLE  . COPD (chronic obstructive pulmonary disease) ([place]) 10/19/2011    "touch"  . Exertional dyspnea 10/19/2011  . OSA on CPAP 10/19/2011    "sometimes wear CPAP"  . Prostate cancer ([place])     S/P radiation tx ~ 2008  . History of blood transfusion     "think it was related to pacemaker placement"  . GERD (gastroesophageal reflux disease)   . Arthritis     "back"  . Chronic lower back pain   . Skin change     12-24-14 few scalp lesions burned off"old age spots"  . Anemia     occ. injections for this at [facility]  . Pacemaker     Medtronic    Past Surgical History  Procedure Laterality Date  . Hernia repair  ~ 2010;     lap; [facility]  . Hernia repair  ~ [id number]    lap v[facility] complicated by recurrence  . Hernia repair  09/2011    open; [facility]  . Insert / replace / remove pacemaker  ~ 2009    initial placement  . Cataract extraction w/ intraocular lens  implant, bilateral    . Joint replacement    . Total knee arthroplasty  1990's    right  . I&d extremity Left 10/20/2012    Procedure: LEFT LEG IRRIGATION AND DEBRIDEMENT, AND WOUND VAC APPLICATION;  Surgeon: Marianna Payment, MD;  [facility];  Service: Orthopedics;  Laterality: Left;  . I&d extremity Left 10/22/2012    Procedure: IRRIGATION AND DEBRIDEMENT EXTREMITY;  Surgeon: Marianna Payment, MD;  [facility];  Service: Orthopedics;  Laterality: Left;  . Skin split graft Left 10/22/2012   Procedure: SKIN GRAFT SPLIT THICKNESS;  Surgeon: Marianna Payment, MD;  [facility];  Service: Orthopedics;  Laterality: Left;  . Carpal tunnel release Left 11/14/2014    Procedure: CARPAL TUNNEL RELEASE LEFT THUMB;  Surgeon: Daryll Brod, MD;  Location: [facility];  Service: Orthopedics;  Laterality: Left;  ANESTHESIA:  IV REGIONAL FAB  . Esophagogastroduodenoscopy (egd) with propofol N/A 01/14/2015    Procedure: ESOPHAGOGASTRODUODENOSCOPY (EGD) WITH PROPOFOL;  Surgeon: Garlan Fair, MD;  [facility];  Service: Endoscopy;  Laterality: N/A;  . Balloon dilation N/A 01/14/2015    Procedure: BALLOON DILATION;  Surgeon: Garlan Fair, MD;  [facility];  Service: Endoscopy;  Laterality: N/A;  . Cardiac catheterization    . Eye surgery Bilateral     cataract  . Cholecystectomy  10/23/2011  . Cholecystectomy  10/23/2011    Procedure: CHOLECYSTECTOMY;  Surgeon: Rolm Bookbinder, MD;  Location: [facility];  Service: General;  Laterality: N/A;  with intraoperative cholangiogram  . Orif elbow fracture Left 03/29/2015    Procedure: LEFT ELBOW OPEN REDUCTION INTERNAL FIXATION (ORIF) DISTAL HUMERUS FRACTURE WITH OLECRANON OSTEOTOMY AND  ULNAR NERVE RELEASE;  Surgeon: Roseanne Kaufman, MD;  Location: [place];  Service: Orthopedics;  Laterality: Left;    VITAL SIGNS BP 122/65 mmHg  Pulse 86  Temp(Src) 98.3 F (36.8 C) (Oral)  Resp 16  Ht 5\' 10"  (1.778 m)  Wt 174 lb 8 oz (79.153 kg)  BMI 25.04 kg/m2  SpO2 97%  Patient's Medications  New Prescriptions   No medications on file  Previous Medications   ASPIRIN EC 81 MG TABLET    Take 81 mg by mouth daily.   CALCIUM 600-200 MG-UNIT PER TABLET    Take 1 tablet by mouth 2 (two) times daily.    CHOLECALCIFEROL (VITAMIN D) 1000 UNITS TABLET    Take 1,000 Units by mouth daily.   DARBEPOETIN ALFA-POLYSORBATE (ARANESP, ALB FREE, SURECLICK) XX123456 MCG/ML INJECTION    Inject 500 mcg into the skin See admin instructions. Every 2  months   DIAZEPAM (VALIUM) 5 MG TABLET    Take 5 mg by mouth at bedtime.   FERROUS SULFATE 325 (65 FE) MG TABLET    Take 1 tablet (325 mg total) by mouth 2 (two) times daily with a meal.   FUROSEMIDE (LASIX) 40 MG TABLET    Take 40 mg by mouth See admin instructions. Take 40mg  daily.  May take an additional 40mg  for leg swelling   LISINOPRIL (PRINIVIL,ZESTRIL) 20 MG TABLET    Take 10 mg by mouth 2 (two) times daily.    MULTIPLE VITAMINS-MINERALS (PRESERVISION AREDS PO)    Take 1 tablet by mouth 2 (two) times daily.   OMEGA-3 FATTY ACIDS (FISH OIL) 1200 MG CAPS    Take 1 capsule by mouth daily.     OMEPRAZOLE (PRILOSEC) 20 MG CAPSULE    Take 20 mg by mouth daily.    OXYBUTYNIN (DITROPAN XL) 15 MG 24 HR TABLET    Take 15 mg by mouth every other day.    OXYCODONE (OXY IR/ROXICODONE) 5 MG IMMEDIATE RELEASE TABLET    Take 1-2 tablets (5-10 mg total) by mouth every 3 (three) hours as needed for moderate pain.   UMECLIDINIUM-VILANTEROL (ANORO ELLIPTA) 62.5-25 MCG/INH AEPB    Inhale 1 puff into the lungs daily.    VITAMIN B-12 (CYANOCOBALAMIN) 500 MCG TABLET    Take 500 mcg by mouth daily.   Modified Medications   No medications on file  Discontinued Medications     SIGNIFICANT DIAGNOSTIC EXAMS   Review of Systems  Constitutional: Negative for malaise/fatigue.  Respiratory: Negative for cough and shortness of breath.   Cardiovascular: Negative for chest pain, palpitations and leg swelling.  Gastrointestinal: Negative for heartburn, abdominal pain and constipation.  Musculoskeletal: Negative for myalgias, back pain and joint pain.  Skin: Negative.   Neurological: Negative for dizziness.  Psychiatric/Behavioral: The patient is not nervous/anxious.     Physical Exam  Constitutional: He is oriented to person, place, and time. No distress.  Eyes: Conjunctivae are normal.  Neck: Neck supple. No JVD present. No thyromegaly present.  Cardiovascular: Normal rate, regular rhythm and intact distal  pulses.   Respiratory: Effort normal and breath sounds normal. No respiratory distress. He has no wheezes.  GI: Soft. Bowel sounds are normal. He exhibits no distension. There is no tenderness.  Musculoskeletal: He exhibits no edema.  Able to move all extremities  Is status post left elbow orif   Lymphadenopathy:    He has no cervical adenopathy.  Neurological: He is alert and oriented to person, place, and time.  Skin: Skin is  warm and dry. He is not diaphoretic.  Psychiatric: He has a normal mood and affect.      ASSESSMENT/ PLAN:  Will discharge to home with home health for pt/ot to evaluate and treat as indicated for gait; balance and adl training. Per therapy he will benefit from vertical and hortizonal grab bars and bars in his shower. He has an orthopedic appointment on 05-12-15. His prescriptions have been written for a 30 day supply of his medications with #30 oxycodone 5 mg tabs and #30 valium 5 mg tabs.    Time spent with patient  40  minutes >50% time spent counseling; reviewing medical record; tests; labs; and developing future plan of care    Ok Edwards NP [facility]  Contact [phone] Monday through Friday 8am- 5pm  After hours call [phone]

## 2015-06-09 DIAGNOSIS — M4316 Spondylolisthesis, lumbar region: Secondary | ICD-10-CM | POA: Diagnosis not present

## 2015-06-09 DIAGNOSIS — Z79891 Long term (current) use of opiate analgesic: Secondary | ICD-10-CM | POA: Diagnosis not present

## 2015-06-09 DIAGNOSIS — M4806 Spinal stenosis, lumbar region: Secondary | ICD-10-CM | POA: Diagnosis not present

## 2015-06-09 DIAGNOSIS — G894 Chronic pain syndrome: Secondary | ICD-10-CM | POA: Diagnosis not present

## 2015-06-10 ENCOUNTER — Telehealth: Payer: Self-pay | Admitting: Oncology

## 2015-06-10 ENCOUNTER — Other Ambulatory Visit (HOSPITAL_BASED_OUTPATIENT_CLINIC_OR_DEPARTMENT_OTHER): Payer: Medicare Other

## 2015-06-10 ENCOUNTER — Ambulatory Visit: Payer: Medicare Other

## 2015-06-10 DIAGNOSIS — J449 Chronic obstructive pulmonary disease, unspecified: Secondary | ICD-10-CM

## 2015-06-10 DIAGNOSIS — N189 Chronic kidney disease, unspecified: Principal | ICD-10-CM

## 2015-06-10 DIAGNOSIS — D631 Anemia in chronic kidney disease: Secondary | ICD-10-CM

## 2015-06-10 DIAGNOSIS — K432 Incisional hernia without obstruction or gangrene: Secondary | ICD-10-CM

## 2015-06-10 LAB — CBC WITH DIFFERENTIAL/PLATELET
BASO%: 0.5 % (ref 0.0–2.0)
Basophils Absolute: 0 10*3/uL (ref 0.0–0.1)
EOS%: 1.9 % (ref 0.0–7.0)
Eosinophils Absolute: 0.1 10*3/uL (ref 0.0–0.5)
HCT: 34 % — ABNORMAL LOW (ref 38.4–49.9)
HEMOGLOBIN: 11 g/dL — AB (ref 13.0–17.1)
LYMPH%: 22.6 % (ref 14.0–49.0)
MCH: 30.2 pg (ref 27.2–33.4)
MCHC: 32.3 g/dL (ref 32.0–36.0)
MCV: 93.4 fL (ref 79.3–98.0)
MONO#: 0.3 10*3/uL (ref 0.1–0.9)
MONO%: 5.7 % (ref 0.0–14.0)
NEUT%: 69.3 % (ref 39.0–75.0)
NEUTROS ABS: 3.8 10*3/uL (ref 1.5–6.5)
Platelets: 120 10*3/uL — ABNORMAL LOW (ref 140–400)
RBC: 3.64 10*6/uL — AB (ref 4.20–5.82)
RDW: 14.9 % — AB (ref 11.0–14.6)
WBC: 5.5 10*3/uL (ref 4.0–10.3)
lymph#: 1.2 10*3/uL (ref 0.9–3.3)

## 2015-06-10 MED ORDER — DARBEPOETIN ALFA 300 MCG/0.6ML IJ SOSY: 300.0000 ug | PREFILLED_SYRINGE | Freq: Once | INTRAMUSCULAR | Status: DC

## 2015-06-10 NOTE — Telephone Encounter (Signed)
s.w. pt and confirmed appt....pt ok and aware of todays appt

## 2015-06-16 DIAGNOSIS — S5402XA Injury of ulnar nerve at forearm level, left arm, initial encounter: Secondary | ICD-10-CM

## 2015-06-16 HISTORY — DX: Injury of ulnar nerve at forearm level, left arm, initial encounter: S54.02XA

## 2015-06-26 ENCOUNTER — Ambulatory Visit (INDEPENDENT_AMBULATORY_CARE_PROVIDER_SITE_OTHER): Payer: Medicare Other | Admitting: Neurology

## 2015-06-26 ENCOUNTER — Encounter: Payer: Self-pay | Admitting: Neurology

## 2015-06-26 VITALS — BP 126/58 | HR 56 | Ht 70.0 in | Wt 173.5 lb

## 2015-06-26 DIAGNOSIS — R269 Unspecified abnormalities of gait and mobility: Secondary | ICD-10-CM | POA: Diagnosis not present

## 2015-06-26 DIAGNOSIS — R42 Dizziness and giddiness: Secondary | ICD-10-CM

## 2015-06-26 HISTORY — DX: Unspecified abnormalities of gait and mobility: R26.9

## 2015-06-26 MED ORDER — DULOXETINE HCL 20 MG PO CPEP: 20.0000 mg | ORAL_CAPSULE | Freq: Every day | ORAL | Status: DC

## 2015-06-26 NOTE — Progress Notes (Signed)
Reason for visit: Dizziness, left hand weakness  Referring physician: Dr. Karin Lieu [name] is an 82 y.o. male  History of present illness:  [name] is an 82 year old right-handed white male with a history of a fall with a left elbow fracture that occurred in January 2017. The patient has been followed by Dr. Amedeo Plenty for this, and the patient indicates that he has had some weakness in the left hand following a fall. The patient has gradually improved with some of the weakness and numbness in the left hand. He has been dropping things from the left hand. He denies any significant neck pain, but does have some mid back pain. He has also had left carpal tunnel syndrome surgery recently. The patient is slowly regaining strength and sensation in the left hand, he indicates that he does have an elbow pad to protect the left elbow. His main concern today is that he has chronic issues with dizziness. The dizziness is associated with a lightheaded floaty feeling, and is generally much better in the morning, worse as the day goes on. The lightheaded sensations occur when he is standing, and get much better with sitting or lying down. The patient has not had any fainting episodes. He feels unsteady on his feet, he feels as if he is dragging his right leg. He does have some chronic low back pain, he uses a cane for ambulation. He reports that if he stands still, he may have a tendency to fall backwards. The patient is getting physical therapy for the left hand, in the past physical therapy did not help his ability to ambulate. The dizziness has been present for several years at this point. He did have a CT scan of the head done in the fall of 2016 that was relatively unremarkable. The patient has a pacemaker in place, he cannot have MRI evaluation.  Past Medical History  Diagnosis Date  . Hypertension   . Hearing loss   . Leg swelling 10/19/2011    LLE  . COPD (chronic obstructive pulmonary  disease) ([place]) 10/19/2011    "touch"  . Exertional dyspnea 10/19/2011  . OSA on CPAP 10/19/2011    "sometimes wear CPAP"  . Prostate cancer ([place])     S/P radiation tx ~ 2008  . History of blood transfusion     "think it was related to pacemaker placement"  . GERD (gastroesophageal reflux disease)   . Arthritis     "back"  . Chronic lower back pain   . Skin change     12-24-14 few scalp lesions burned off"old age spots"  . Anemia     occ. injections for this at [facility]  . Pacemaker     Medtronic  . Dizziness and giddiness 06/26/2015  . Abnormality of gait 06/26/2015    Past Surgical History  Procedure Laterality Date  . Hernia repair  ~ 2010;     lap; [facility]  . Hernia repair  ~ [id number]    lap vh complicated by recurrence  . Hernia repair  09/2011    open; [facility]  . Insert / replace / remove pacemaker  ~ 2009    initial placement  . Cataract extraction w/ intraocular lens  implant, bilateral    . Joint replacement    . Total knee arthroplasty  1990's    right  . I&d extremity Left 10/20/2012    Procedure: LEFT LEG IRRIGATION AND DEBRIDEMENT, AND WOUND VAC APPLICATION;  Surgeon: Mosetta Pigeon  Erlinda Hong, MD;  [facility];  Service: Orthopedics;  Laterality: Left;  . I&d extremity Left 10/22/2012    Procedure: IRRIGATION AND DEBRIDEMENT EXTREMITY;  Surgeon: Marianna Payment, MD;  [facility];  Service: Orthopedics;  Laterality: Left;  . Skin split graft Left 10/22/2012    Procedure: SKIN GRAFT SPLIT THICKNESS;  Surgeon: Marianna Payment, MD;  [facility];  Service: Orthopedics;  Laterality: Left;  . Carpal tunnel release Left 11/14/2014    Procedure: CARPAL TUNNEL RELEASE LEFT THUMB;  Surgeon: Daryll Brod, MD;  Location: [facility];  Service: Orthopedics;  Laterality: Left;  ANESTHESIA:  IV REGIONAL FAB  . Esophagogastroduodenoscopy (egd) with propofol N/A 01/14/2015    Procedure: ESOPHAGOGASTRODUODENOSCOPY (EGD) WITH PROPOFOL;  Surgeon: Garlan Fair, MD;   [facility];  Service: Endoscopy;  Laterality: N/A;  . Balloon dilation N/A 01/14/2015    Procedure: BALLOON DILATION;  Surgeon: Garlan Fair, MD;  [facility];  Service: Endoscopy;  Laterality: N/A;  . Cardiac catheterization    . Eye surgery Bilateral     cataract  . Cholecystectomy  10/23/2011  . Cholecystectomy  10/23/2011    Procedure: CHOLECYSTECTOMY;  Surgeon: Rolm Bookbinder, MD;  Location: [place];  Service: General;  Laterality: N/A;  with intraoperative cholangiogram  . Orif elbow fracture Left 03/29/2015    Procedure: LEFT ELBOW OPEN REDUCTION INTERNAL FIXATION (ORIF) DISTAL HUMERUS FRACTURE WITH OLECRANON OSTEOTOMY AND ULNAR NERVE RELEASE;  Surgeon: Roseanne Kaufman, MD;  Location: [place];  Service: Orthopedics;  Laterality: Left;    Family History  Problem Relation Age of Onset  . Heart disease Father   . Heart disease Mother   . Colon cancer Mother   . Heart attack Neg Hx   . Hypertension Neg Hx   . Stroke Mother     Social history:  reports that he quit smoking about 42 years ago. His smoking use included Cigarettes. He has a 52.5 pack-year smoking history. He has never used smokeless tobacco. He reports that he does not drink alcohol or use illicit drugs.  Medications:  Prior to Admission medications   Medication Sig Start Date End Date Taking? Authorizing Provider  aspirin EC 81 MG tablet Take 81 mg by mouth daily.   Yes Historical Provider, MD  darbepoetin alfa-polysorbate (ARANESP, ALB FREE, SURECLICK) XX123456 MCG/ML injection Inject 500 mcg into the skin See admin instructions. Every 2 months   Yes Historical Provider, MD  furosemide (LASIX) 40 MG tablet Take 40 mg by mouth See admin instructions. Take 40mg  daily.  May take an additional 40mg  for leg swelling   Yes Historical Provider, MD  lisinopril (PRINIVIL,ZESTRIL) 20 MG tablet Take 10 mg by mouth 2 (two) times daily.    Yes Historical Provider, MD  Multiple Vitamins-Minerals (PRESERVISION AREDS  PO) Take 1 tablet by mouth 2 (two) times daily.   Yes Historical Provider, MD  omeprazole (PRILOSEC) 20 MG capsule Take 20 mg by mouth daily.    Yes Historical Provider, MD  oxybutynin (DITROPAN XL) 15 MG 24 hr tablet Take 15 mg by mouth every other day.    Yes Historical Provider, MD  oxyCODONE (OXY IR/ROXICODONE) 5 MG immediate release tablet Take 1-2 tablets (5-10 mg total) by mouth every 3 (three) hours as needed for moderate pain. 04/01/15  Yes Avelina Laine, PA-C  Umeclidinium-Vilanterol (ANORO ELLIPTA) 62.5-25 MCG/INH AEPB Inhale 1 puff into the lungs daily.    Yes Historical Provider, MD  vitamin B-12 (CYANOCOBALAMIN) 500 MCG  tablet Take 500 mcg by mouth daily.    Yes Historical Provider, MD     No Known Allergies  ROS:  Out of a complete 14 system review of symptoms, the patient complains only of the following symptoms, and all other reviewed systems are negative.  Weight loss of 10 pounds, fatigue Swelling in the legs Hearing loss, ringing in the ears Shortness of breath Easy bruising, easy bleeding Joint pain, muscle cramps Dizziness Sleepiness Decreased energy, change in appetite  Blood pressure 126/58, pulse 56, height 5\' 10"  (1.778 m), weight 173 lb 8 oz (78.699 kg).  Physical Exam  General: The patient is alert and cooperative at the time of the examination.  Eyes: Pupils are equal, round, and reactive to light. Discs are flat bilaterally.  Neck: The neck is supple, no carotid bruits are noted.  Respiratory: The respiratory examination is clear.  Cardiovascular: The cardiovascular examination reveals a regular rate and rhythm, no obvious murmurs or rubs are noted.  Skin: Extremities are with 2+ edema below the knees bilaterally.  Neurologic Exam  Mental status: The patient is alert and oriented x 3 at the time of the examination. The patient has apparent normal recent and remote memory, with an apparently normal attention span and concentration  ability.  Cranial nerves: Facial symmetry is present. There is good sensation of the face to pinprick and soft touch bilaterally. The strength of the facial muscles and the muscles to head turning and shoulder shrug are normal bilaterally. Speech is well enunciated, no aphasia or dysarthria is noted. Extraocular movements are full. Visual fields are full. The tongue is midline, and the patient has symmetric elevation of the soft palate. No obvious hearing deficits are noted.  Motor: The motor testing reveals 5 over 5 strength of all 4 extremities, with exception that there is some 4/5 strength with the intrinsic muscles of the hands bilaterally, and with finger extension on the left hand no weakness with wrist extension is seen on this side. The distal flexors of all the fingers and thumb are normal strength bilaterally.Kermit Balo symmetric motor tone is noted throughout.  Sensory: Sensory testing is intact to pinprick, soft touch, vibration sensation, and position sense on all 4 extremities. No definite stocking pattern pinprick sensory deficit is noted. No evidence of extinction is noted.  Coordination: Cerebellar testing reveals good finger-nose-finger and heel-to-shin bilaterally.  Gait and station: Gait is slow and deliberate, slightly wide-based. The tandem gait was not tested. Romberg is slightly unsteady, the patient does not fall.  Reflexes: Deep tendon reflexes are symmetric, but are depressed bilaterally. Toes are downgoing bilaterally.   CT head 11/22/14:  IMPRESSION: No acute intracranial abnormality.  * CT scan images were reviewed online. I agree with the written report.    Assessment/Plan:  1. Left elbow fracture, probable left ulnar nerve injury  2. Chronic dizziness  3. Gait disorder  The patient mainly concerned about his dizziness. This is present later on in the day associated with a pressure sensation in the frontal areas bilaterally. There is no true vertigo. The  dizziness improves with sitting or lying down. The patient does not appear to be orthostatic today. The patient has had a recent CT of the head. He will be placed on low-dose Cymbalta, the pressure and dizzy sensations seem to simulate a tension-type headache. The patient appears to have sustained a left ulnar neuropathy, but by history the weakness and numbness is improving spontaneously. We will follow this over time. He will follow-up  in about 3 or 4 months. The patient has a pre-existing history of a peripheral neuropathy.   Jill Alexanders MD 06/26/2015 7:33 PM  [facility] [address] [place], [place] [address]  Phone [phone] Fax [phone]

## 2015-06-26 NOTE — Patient Instructions (Signed)
Fall Prevention in the Home  Falls can cause injuries and can affect people from all age groups. There are many simple things that you can do to make your home safe and to help prevent falls. WHAT CAN I DO ON THE OUTSIDE OF MY HOME?  Regularly repair the edges of walkways and driveways and fix any cracks.  Remove high doorway thresholds.  Trim any shrubbery on the main path into your home.  Use bright outdoor lighting.  Clear walkways of debris and clutter, including tools and rocks.  Regularly check that handrails are securely fastened and in good repair. Both sides of any steps should have handrails.  Install guardrails along the edges of any raised decks or porches.  Have leaves, snow, and ice cleared regularly.  Use sand or salt on walkways during winter months.  In the garage, clean up any spills right away, including grease or oil spills. WHAT CAN I DO IN THE BATHROOM?  Use night lights.  Install grab bars by the toilet and in the tub and shower. Do not use towel bars as grab bars.  Use non-skid mats or decals on the floor of the tub or shower.  If you need to sit down while you are in the shower, use a plastic, non-slip stool..  Keep the floor dry. Immediately clean up any water that spills on the floor.  Remove soap buildup in the tub or shower on a regular basis.  Attach bath mats securely with double-sided non-slip rug tape.  Remove throw rugs and other tripping hazards from the floor. WHAT CAN I DO IN THE BEDROOM?  Use night lights.  Make sure that a bedside light is easy to reach.  Do not use oversized bedding that drapes onto the floor.  Have a firm chair that has side arms to use for getting dressed.  Remove throw rugs and other tripping hazards from the floor. WHAT CAN I DO IN THE KITCHEN?   Clean up any spills right away.  Avoid walking on wet floors.  Place frequently used items in easy-to-reach places.  If you need to reach for something  above you, use a sturdy step stool that has a grab bar.  Keep electrical cables out of the way.  Do not use floor polish or wax that makes floors slippery. If you have to use wax, make sure that it is non-skid floor wax.  Remove throw rugs and other tripping hazards from the floor. WHAT CAN I DO IN THE STAIRWAYS?  Do not leave any items on the stairs.  Make sure that there are handrails on both sides of the stairs. Fix handrails that are broken or loose. Make sure that handrails are as long as the stairways.  Check any carpeting to make sure that it is firmly attached to the stairs. Fix any carpet that is loose or worn.  Avoid having throw rugs at the top or bottom of stairways, or secure the rugs with carpet tape to prevent them from moving.  Make sure that you have a light switch at the top of the stairs and the bottom of the stairs. If you do not have them, have them installed. WHAT ARE SOME OTHER FALL PREVENTION TIPS?  Wear closed-toe shoes that fit well and support your feet. Wear shoes that have rubber soles or low heels.  When you use a stepladder, make sure that it is completely opened and that the sides are firmly locked. Have someone hold the ladder while you   are using it. Do not climb a closed stepladder.  Add color or contrast paint or tape to grab bars and handrails in your home. Place contrasting color strips on the first and last steps.  Use mobility aids as needed, such as canes, walkers, scooters, and crutches.  Turn on lights if it is dark. Replace any light bulbs that burn out.  Set up furniture so that there are clear paths. Keep the furniture in the same spot.  Fix any uneven floor surfaces.  Choose a carpet design that does not hide the edge of steps of a stairway.  Be aware of any and all pets.  Review your medicines with your healthcare provider. Some medicines can cause dizziness or changes in blood pressure, which increase your risk of falling. Talk  with your health care provider about other ways that you can decrease your risk of falls. This may include working with a physical therapist or trainer to improve your strength, balance, and endurance.   This information is not intended to replace advice given to you by your health care provider. Make sure you discuss any questions you have with your health care provider.   Document Released: 01/22/2002 Document Revised: 06/18/2014 Document Reviewed: 03/08/2014 Elsevier Interactive Patient Education 2016 [employer] Inc[employer]  

## 2015-07-01 DIAGNOSIS — D692 Other nonthrombocytopenic purpura: Secondary | ICD-10-CM | POA: Diagnosis not present

## 2015-07-01 DIAGNOSIS — D649 Anemia, unspecified: Secondary | ICD-10-CM | POA: Diagnosis not present

## 2015-07-01 DIAGNOSIS — Z79899 Other long term (current) drug therapy: Secondary | ICD-10-CM | POA: Diagnosis not present

## 2015-07-01 DIAGNOSIS — I10 Essential (primary) hypertension: Secondary | ICD-10-CM | POA: Diagnosis not present

## 2015-07-01 DIAGNOSIS — Z1389 Encounter for screening for other disorder: Secondary | ICD-10-CM | POA: Diagnosis not present

## 2015-07-01 DIAGNOSIS — Z7189 Other specified counseling: Secondary | ICD-10-CM | POA: Diagnosis not present

## 2015-07-01 DIAGNOSIS — Z0000 Encounter for general adult medical examination without abnormal findings: Secondary | ICD-10-CM | POA: Diagnosis not present

## 2015-07-01 DIAGNOSIS — G5622 Lesion of ulnar nerve, left upper limb: Secondary | ICD-10-CM | POA: Diagnosis not present

## 2015-07-01 DIAGNOSIS — R42 Dizziness and giddiness: Secondary | ICD-10-CM | POA: Diagnosis not present

## 2015-07-01 DIAGNOSIS — R2689 Other abnormalities of gait and mobility: Secondary | ICD-10-CM | POA: Diagnosis not present

## 2015-07-02 ENCOUNTER — Telehealth: Payer: Self-pay | Admitting: *Deleted

## 2015-07-02 NOTE — Telephone Encounter (Signed)
OFFICE               Taken 17-MAY-17 at  3:24PM by [facility] ------------------------------------------------------------ [name] [name]             CID [id number]  Patient SELF                 Pt's Dr Jannifer Franklin       Area Code 336 Phone# [phone] * DOB [birth year]       RE SPEAK WITH NURSE- WANTS REPORT FROM LAST VISIT    NEED RX SUBSCRIBED LAST WEEK-                        Disp:Y/N N If Y = C/B If No Response In 68minutes ==========

## 2015-07-03 MED ORDER — DULOXETINE HCL 20 MG PO CPEP: 20.0000 mg | ORAL_CAPSULE | Freq: Every day | ORAL | Status: DC

## 2015-07-03 NOTE — Telephone Encounter (Signed)
Returned pt's call. He asks that copy of OV notes be mailed to him. Would also like hard copy script of duloxetine. Only got med partially filled @ [facility] d/t expense. He would like to try to get medication through the [place]. Printed and awaiting signature to mail to pt as requested.

## 2015-07-07 DIAGNOSIS — M4806 Spinal stenosis, lumbar region: Secondary | ICD-10-CM | POA: Diagnosis not present

## 2015-07-08 ENCOUNTER — Telehealth: Payer: Self-pay | Admitting: Oncology

## 2015-07-08 NOTE — Telephone Encounter (Signed)
returned called and s.w. pt and confirmed June appt.Marland KitchenMarland KitchenMarland KitchenMarland Kitchenpt ok and aware

## 2015-07-11 ENCOUNTER — Ambulatory Visit: Payer: Medicare Other | Admitting: Pulmonary Disease

## 2015-07-15 DIAGNOSIS — Z79899 Other long term (current) drug therapy: Secondary | ICD-10-CM | POA: Diagnosis not present

## 2015-08-05 ENCOUNTER — Ambulatory Visit (HOSPITAL_BASED_OUTPATIENT_CLINIC_OR_DEPARTMENT_OTHER): Payer: Medicare Other

## 2015-08-05 ENCOUNTER — Other Ambulatory Visit (HOSPITAL_BASED_OUTPATIENT_CLINIC_OR_DEPARTMENT_OTHER): Payer: Medicare Other

## 2015-08-05 VITALS — BP 129/85 | HR 75 | Temp 98.3°F | Resp 22

## 2015-08-05 DIAGNOSIS — D631 Anemia in chronic kidney disease: Secondary | ICD-10-CM | POA: Diagnosis not present

## 2015-08-05 DIAGNOSIS — C61 Malignant neoplasm of prostate: Secondary | ICD-10-CM

## 2015-08-05 DIAGNOSIS — N189 Chronic kidney disease, unspecified: Secondary | ICD-10-CM

## 2015-08-05 DIAGNOSIS — D649 Anemia, unspecified: Secondary | ICD-10-CM

## 2015-08-05 DIAGNOSIS — K432 Incisional hernia without obstruction or gangrene: Secondary | ICD-10-CM

## 2015-08-05 DIAGNOSIS — J449 Chronic obstructive pulmonary disease, unspecified: Secondary | ICD-10-CM

## 2015-08-05 LAB — CBC WITH DIFFERENTIAL/PLATELET
BASO%: 0.4 % (ref 0.0–2.0)
BASOS ABS: 0 10*3/uL (ref 0.0–0.1)
EOS%: 4.5 % (ref 0.0–7.0)
Eosinophils Absolute: 0.3 10*3/uL (ref 0.0–0.5)
HCT: 31.3 % — ABNORMAL LOW (ref 38.4–49.9)
HGB: 10.2 g/dL — ABNORMAL LOW (ref 13.0–17.1)
LYMPH%: 16.8 % (ref 14.0–49.0)
MCH: 31.3 pg (ref 27.2–33.4)
MCHC: 32.6 g/dL (ref 32.0–36.0)
MCV: 96 fL (ref 79.3–98.0)
MONO#: 0.4 10*3/uL (ref 0.1–0.9)
MONO%: 5.8 % (ref 0.0–14.0)
NEUT#: 5.1 10*3/uL (ref 1.5–6.5)
NEUT%: 72.5 % (ref 39.0–75.0)
Platelets: 132 10*3/uL — ABNORMAL LOW (ref 140–400)
RBC: 3.26 10*6/uL — AB (ref 4.20–5.82)
RDW: 14 % (ref 11.0–14.6)
WBC: 7.1 10*3/uL (ref 4.0–10.3)
lymph#: 1.2 10*3/uL (ref 0.9–3.3)

## 2015-08-05 MED ORDER — DARBEPOETIN ALFA 300 MCG/0.6ML IJ SOSY
300.0000 ug | PREFILLED_SYRINGE | Freq: Once | INTRAMUSCULAR | Status: AC
  Administered 2015-08-05: 300 ug via SUBCUTANEOUS
  Filled 2015-08-05: qty 0.6

## 2015-08-05 NOTE — Patient Instructions (Signed)
Darbepoetin Alfa injection What is this medicine? DARBEPOETIN ALFA (dar be POE e tin AL fa) helps your body make more red blood cells. It is used to treat anemia caused by chronic kidney failure and chemotherapy. This medicine may be used for other purposes; ask your health care provider or pharmacist if you have questions. COMMON BRAND NAME(S): Aranesp What should I tell my health care provider before I take this medicine? They need to know if you have any of these conditions: -blood clotting disorders or history of blood clots -cancer patient not on chemotherapy -cystic fibrosis -heart disease, such as angina, heart failure, or a history of a heart attack -hemoglobin level of 12 g/dL or greater -high blood pressure -low levels of folate, iron, or vitamin B12 -seizures -an unusual or allergic reaction to darbepoetin, erythropoietin, albumin, hamster proteins, latex, other medicines, foods, dyes, or preservatives -pregnant or trying to get pregnant -breast-feeding How should I use this medicine? This medicine is for injection into a vein or under the skin. It is usually given by a health care professional in a hospital or clinic setting. If you get this medicine at home, you will be taught how to prepare and give this medicine. Do not shake the solution before you withdraw a dose. Use exactly as directed. Take your medicine at regular intervals. Do not take your medicine more often than directed. It is important that you put your used needles and syringes in a special sharps container. Do not put them in a trash can. If you do not have a sharps container, call your pharmacist or healthcare provider to get one. Talk to your pediatrician regarding the use of this medicine in children. While this medicine may be used in children as young as 1 year for selected conditions, precautions do apply. Overdosage: If you think you have taken too much of this medicine contact a poison control center or  emergency room at once. NOTE: This medicine is only for you. Do not share this medicine with others. What if I miss a dose? If you miss a dose, take it as soon as you can. If it is almost time for your next dose, take only that dose. Do not take double or extra doses. What may interact with this medicine? Do not take this medicine with any of the following medications: -epoetin alfa This list may not describe all possible interactions. Give your health care provider a list of all the medicines, herbs, non-prescription drugs, or dietary supplements you use. Also tell them if you smoke, drink alcohol, or use illegal drugs. Some items may interact with your medicine. What should I watch for while using this medicine? Visit your prescriber or health care professional for regular checks on your progress and for the needed blood tests and blood pressure measurements. It is especially important for the doctor to make sure your hemoglobin level is in the desired range, to limit the risk of potential side effects and to give you the best benefit. Keep all appointments for any recommended tests. Check your blood pressure as directed. Ask your doctor what your blood pressure should be and when you should contact him or her. As your body makes more red blood cells, you may need to take iron, folic acid, or vitamin B supplements. Ask your doctor or health care provider which products are right for you. If you have kidney disease continue dietary restrictions, even though this medication can make you feel better. Talk with your doctor or health   care professional about the foods you eat and the vitamins that you take. What side effects may I notice from receiving this medicine? Side effects that you should report to your doctor or health care professional as soon as possible: -allergic reactions like skin rash, itching or hives, swelling of the face, lips, or tongue -breathing problems -changes in vision -chest  pain -confusion, trouble speaking or understanding -feeling faint or lightheaded, falls -high blood pressure -muscle aches or pains -pain, swelling, warmth in the leg -rapid weight gain -severe headaches -sudden numbness or weakness of the face, arm or leg -trouble walking, dizziness, loss of balance or coordination -seizures (convulsions) -swelling of the ankles, feet, hands -unusually weak or tired Side effects that usually do not require medical attention (report to your doctor or health care professional if they continue or are bothersome): -diarrhea -fever, chills (flu-like symptoms) -headaches -nausea, vomiting -redness, stinging, or swelling at site where injected This list may not describe all possible side effects. Call your doctor for medical advice about side effects. You may report side effects to FDA at 1-800-FDA-1088. Where should I keep my medicine? Keep out of the reach of children. Store in a refrigerator between 2 and 8 degrees C (36 and 46 degrees F). Do not freeze. Do not shake. Throw away any unused portion if using a single-dose vial. Throw away any unused medicine after the expiration date. NOTE: This sheet is a summary. It may not cover all possible information. If you have questions about this medicine, talk to your doctor, pharmacist, or health care provider.  2015, Elsevier/Gold Standard. (2008-01-16 10:23:57)  

## 2015-08-06 ENCOUNTER — Encounter: Payer: Self-pay | Admitting: Internal Medicine

## 2015-08-06 ENCOUNTER — Ambulatory Visit (INDEPENDENT_AMBULATORY_CARE_PROVIDER_SITE_OTHER): Payer: Medicare Other | Admitting: Internal Medicine

## 2015-08-06 VITALS — BP 114/58 | HR 87 | Ht 70.0 in | Wt 174.0 lb

## 2015-08-06 DIAGNOSIS — I4891 Unspecified atrial fibrillation: Secondary | ICD-10-CM | POA: Diagnosis not present

## 2015-08-06 DIAGNOSIS — I480 Paroxysmal atrial fibrillation: Secondary | ICD-10-CM | POA: Diagnosis not present

## 2015-08-06 DIAGNOSIS — Z950 Presence of cardiac pacemaker: Secondary | ICD-10-CM

## 2015-08-06 DIAGNOSIS — I495 Sick sinus syndrome: Secondary | ICD-10-CM | POA: Diagnosis not present

## 2015-08-06 DIAGNOSIS — D649 Anemia, unspecified: Secondary | ICD-10-CM | POA: Diagnosis not present

## 2015-08-06 NOTE — Progress Notes (Signed)
Patient Care Team: Lajean Manes, MD as PCP - General (Internal Medicine) Wyatt Portela, MD (Hematology and Oncology) Kathie Rhodes, MD as Attending Physician (Urology)   HPI  [name] [name] is a 82 y.o. male Seen to establish pacemaker followup. He underwent device implantation for sinus node dysfunction in the setting of chronic dizziness.    It also has a history of atrial fibrillation and hypertension. He is currently not on anticoagulation per Dr. Saundra Shelling.  He has been diagnosed with peripheral neuropathy. He is undergoing physical therapy for rate balance without improvement.  His blood pressure home runs in the 120-35 rangeThere've been no palpitations. He has stable chronic edema. He denies chest discomfort. He has some fatigue and dyspnea on exertion which is stable  Echocardiogram 4/14 demonstrated normal LV function left atrial enlargement Myoview scan 10/13 demonstrated an EF of 48-56% without evidence of reversible ischemia.   He fell a few months ago and fractured his elbow.  Past Medical History  Diagnosis Date  . Hypertension   . Hearing loss   . Leg swelling 10/19/2011    LLE  . COPD (chronic obstructive pulmonary disease) ([place]) 10/19/2011    "touch"  . Exertional dyspnea 10/19/2011  . OSA on CPAP 10/19/2011    "sometimes wear CPAP"  . Prostate cancer ([place])     S/P radiation tx ~ 2008  . History of blood transfusion     "think it was related to pacemaker placement"  . GERD (gastroesophageal reflux disease)   . Arthritis     "back"  . Chronic lower back pain   . Skin change     12-24-14 few scalp lesions burned off"old age spots"  . Anemia     occ. injections for this at [facility]  . Pacemaker     Medtronic  . Dizziness and giddiness 06/26/2015  . Abnormality of gait 06/26/2015      Current Outpatient Prescriptions  Medication Sig Dispense Refill  . aspirin EC 81 MG tablet Take 81 mg by mouth daily.    . darbepoetin alfa-polysorbate (ARANESP,  ALB FREE, SURECLICK) XX123456 MCG/ML injection Inject 500 mcg into the skin See admin instructions. Every 2 months    . DULoxetine (CYMBALTA) 20 MG capsule Take 1 capsule (20 mg total) by mouth daily. 30 capsule 3  . furosemide (LASIX) 40 MG tablet Take 40 mg by mouth See admin instructions. Take 40mg  daily.  May take an additional 40mg  for leg swelling    . lisinopril (PRINIVIL,ZESTRIL) 20 MG tablet Take 10 mg by mouth 2 (two) times daily.     . Multiple Vitamins-Minerals (PRESERVISION AREDS PO) Take 1 tablet by mouth 2 (two) times daily.    Marland Kitchen omeprazole (PRILOSEC) 20 MG capsule Take 20 mg by mouth daily.     Marland Kitchen oxybutynin (DITROPAN XL) 15 MG 24 hr tablet Take 15 mg by mouth every other day.     . oxyCODONE (OXY IR/ROXICODONE) 5 MG immediate release tablet Take 1-2 tablets (5-10 mg total) by mouth every 3 (three) hours as needed for moderate pain. 40 tablet 0  . Umeclidinium-Vilanterol (ANORO ELLIPTA) 62.5-25 MCG/INH AEPB Inhale 1 puff into the lungs daily.     . vitamin B-12 (CYANOCOBALAMIN) 500 MCG tablet Take 500 mcg by mouth daily.      No current facility-administered medications for this visit.    No Known Allergies  Review of Systems negative except from HPI and PMH  Physical Exam BP 114/58 mmHg  Pulse 87  Ht 5\' 10"  (1.778 m)  Wt 174 lb (78.926 kg)  BMI 24.97 kg/m2  SpO2 98% Well developed and nourished in no acute distress HENT normal Neck supple with JVP-flat Clear Regular rate and rhythm, 2/6 m Abd-soft with active BS No Clubbing cyanosis 1+ edema Skin-warm and dry A & Oriented  Grossly normal sensory and motor function gait is wide-based  ECG demonstrates sinus rhythm at 76 Intervals 16/10/42 Frequent PVCs with a right bundle indeterminate axis morphology-40%   Assessment and  Plan  Sinus node dysfunction  Pacemaker  Medtronic  The patient's device was interrogated.  The information was reviewed. Changes were made as outlined below  Hypertension    PVCs       PVC burden by pacemaker is about 6%. By ECG and auscultation is about 40%. But iam not sure it is the cause of his dizziness-- it seems more likely related to low BP  We will check a metabolic profile and magnesium level.  I'm concerned about his relatively low blood pressure. We will decrease his lisinopril to once daily.

## 2015-08-06 NOTE — Patient Instructions (Addendum)
Medication Instructions: - Your physician has recommended you make the following change in your medication:  1) Decrease lisinopril to 20 mg 1/2 tablet (10 mg) once daily at bedtime  Labwork: - Your physician recommends that you return for lab work today: BMP/ Magnesium/ Iron/ TIBC  Procedures/Testing: - none  Follow-Up: - Remote monitoring is used to monitor your Pacemaker of ICD from home. This monitoring reduces the number of office visits required to check your device to one time per year. It allows [place] to keep an eye on the functioning of your device to ensure it is working properly. You are scheduled for a device check from home on 11/04/15. You may send your transmission at any time that day. If you have a wireless device, the transmission will be sent automatically. After your physician reviews your transmission, you will receive a postcard with your next transmission date.  - Your physician wants you to follow-up in: 1 year with Chanetta Marshall, NP. You will receive a reminder letter in the mail two months in advance. If you don't receive a letter, please call our office to schedule the follow-up appointment.  Any Additional Special Instructions Will Be Listed Below (If Applicable).     If you need a refill on your cardiac medications before your next appointment, please call your pharmacy.

## 2015-08-07 LAB — CUP PACEART INCLINIC DEVICE CHECK
Brady Statistic AP VP Percent: 3 %
Brady Statistic AP VS Percent: 66 %
Brady Statistic AS VP Percent: 1 %
Brady Statistic AS VS Percent: 30 %
Date Time Interrogation Session: 20170621171318
Implantable Lead Implant Date: 20080826
Implantable Lead Location: 753859
Implantable Lead Model: 5076
Lead Channel Impedance Value: 445 Ohm
Lead Channel Impedance Value: 529 Ohm
Lead Channel Pacing Threshold Amplitude: 1.125 V
Lead Channel Pacing Threshold Pulse Width: 0.4 ms
Lead Channel Pacing Threshold Pulse Width: 0.4 ms
Lead Channel Sensing Intrinsic Amplitude: 4 mV
Lead Channel Setting Pacing Amplitude: 1.75 V
Lead Channel Setting Pacing Amplitude: 2.5 V
Lead Channel Setting Sensing Sensitivity: 2 mV
MDC IDC LEAD IMPLANT DT: 20080826
MDC IDC LEAD LOCATION: 753860
MDC IDC MSMT BATTERY IMPEDANCE: 968 Ohm
MDC IDC MSMT BATTERY REMAINING LONGEVITY: 47 mo
MDC IDC MSMT BATTERY VOLTAGE: 2.76 V
MDC IDC MSMT LEADCHNL RA PACING THRESHOLD AMPLITUDE: 0.75 V
MDC IDC MSMT LEADCHNL RA PACING THRESHOLD AMPLITUDE: 0.875 V
MDC IDC MSMT LEADCHNL RA PACING THRESHOLD PULSEWIDTH: 0.4 ms
MDC IDC MSMT LEADCHNL RA SENSING INTR AMPL: 2.8 mV
MDC IDC MSMT LEADCHNL RV PACING THRESHOLD AMPLITUDE: 1 V
MDC IDC MSMT LEADCHNL RV PACING THRESHOLD PULSEWIDTH: 0.4 ms
MDC IDC SET LEADCHNL RV PACING PULSEWIDTH: 0.4 ms

## 2015-08-07 LAB — BASIC METABOLIC PANEL
BUN: 29 mg/dL — AB (ref 7–25)
CO2: 25 mmol/L (ref 20–31)
Calcium: 8.6 mg/dL (ref 8.6–10.3)
Chloride: 102 mmol/L (ref 98–110)
Creat: 1.14 mg/dL — ABNORMAL HIGH (ref 0.70–1.11)
Glucose, Bld: 95 mg/dL (ref 65–99)
POTASSIUM: 4.3 mmol/L (ref 3.5–5.3)
SODIUM: 141 mmol/L (ref 135–146)

## 2015-08-07 LAB — IRON AND TIBC
%SAT: 23 % (ref 15–60)
IRON: 58 ug/dL (ref 50–180)
TIBC: 252 ug/dL (ref 250–425)
UIBC: 194 ug/dL (ref 125–400)

## 2015-08-07 LAB — MAGNESIUM: MAGNESIUM: 1.8 mg/dL (ref 1.5–2.5)

## 2015-08-13 ENCOUNTER — Telehealth: Payer: Self-pay | Admitting: Internal Medicine

## 2015-08-13 NOTE — Telephone Encounter (Signed)
Pt is aware of lab results.

## 2015-08-13 NOTE — Telephone Encounter (Signed)
Pt not home when called. Pt wife asked to call pt in an hour.

## 2015-08-13 NOTE — Telephone Encounter (Signed)
Fu  Pt returning RN phone call- lab results. Please call back and discuss.   

## 2015-08-15 DIAGNOSIS — D692 Other nonthrombocytopenic purpura: Secondary | ICD-10-CM | POA: Diagnosis not present

## 2015-08-15 DIAGNOSIS — G5622 Lesion of ulnar nerve, left upper limb: Secondary | ICD-10-CM | POA: Diagnosis not present

## 2015-08-15 DIAGNOSIS — Z79899 Other long term (current) drug therapy: Secondary | ICD-10-CM | POA: Diagnosis not present

## 2015-08-15 DIAGNOSIS — R42 Dizziness and giddiness: Secondary | ICD-10-CM | POA: Diagnosis not present

## 2015-08-15 DIAGNOSIS — R2689 Other abnormalities of gait and mobility: Secondary | ICD-10-CM | POA: Diagnosis not present

## 2015-08-15 DIAGNOSIS — I10 Essential (primary) hypertension: Secondary | ICD-10-CM | POA: Diagnosis not present

## 2015-08-15 DIAGNOSIS — R682 Dry mouth, unspecified: Secondary | ICD-10-CM | POA: Diagnosis not present

## 2015-08-15 DIAGNOSIS — D649 Anemia, unspecified: Secondary | ICD-10-CM | POA: Diagnosis not present

## 2015-09-10 ENCOUNTER — Encounter: Payer: Self-pay | Admitting: Pulmonary Disease

## 2015-09-10 ENCOUNTER — Ambulatory Visit (INDEPENDENT_AMBULATORY_CARE_PROVIDER_SITE_OTHER): Payer: Medicare Other | Admitting: Pulmonary Disease

## 2015-09-10 ENCOUNTER — Telehealth: Payer: Self-pay | Admitting: Pulmonary Disease

## 2015-09-10 DIAGNOSIS — G4733 Obstructive sleep apnea (adult) (pediatric): Secondary | ICD-10-CM | POA: Diagnosis not present

## 2015-09-10 DIAGNOSIS — J439 Emphysema, unspecified: Secondary | ICD-10-CM

## 2015-09-10 MED ORDER — ALBUTEROL SULFATE HFA 108 (90 BASE) MCG/ACT IN AERS: 2.0000 | INHALATION_SPRAY | Freq: Four times a day (QID) | RESPIRATORY_TRACT | 6 refills | Status: DC | PRN

## 2015-09-10 NOTE — Progress Notes (Signed)
   Subjective:    Patient ID: [name] [name], male    DOB: September 22, 1924, 82 y.o.   MRN: [id number]  HPI  Chief Complaint  Patient presents with  . Sleep Apnea    DME: [facility]; wears CPAP every night, no concerns.  Marland Kitchen COPD    Breathing not doing well. SOB.     82 year old for follow-up of COPD and OSA,former [facility] patient He smoked until the age of 49 about 30-pack-years. He had declining lung function over the past few years, and was placed on Anoro- he does not think this has been helping. He has 3 month supply from the [facility], he stopped using this for about a week and did not feel any different. His wife who is also my patient, reports that he has a very sedentary lifestyle. He goes to the store to buy groceries, does the cooking but does not walk around much at all otherwise. He is limited by peripheral neuropathy and uses a cane.  He also has cardiomyopathy with a pacemaker and uses Lasix for pedal edema, his weight has been stable, in fact he has dropped about 10 pounds over the past year   Significant tests/ events  [place] 2015:  FEV1 1.92 (63%), ratio 55  Review of Systems neg for any significant sore throat, dysphagia, itching, sneezing, nasal congestion or excess/ purulent secretions, fever, chills, sweats, unintended wt loss, pleuritic or exertional cp, hempoptysis, orthopnea pnd or change in chronic leg swelling. Also denies presyncope, palpitations, heartburn, abdominal pain, nausea, vomiting, diarrhea or change in bowel or urinary habits, dysuria,hematuria, rash, arthralgias, visual complaints, headache, numbness weakness or ataxia.     Objective:   Physical Exam   Gen. Pleasant, well-nourished, in no distress,elderly ENT - no lesions, no post nasal drip Neck: No JVD, no thyromegaly, no carotid bruits Lungs: no use of accessory muscles, no dullness to percussion, clear without rales or rhonchi  Cardiovascular: Rhythm regular, heart sounds  normal, no murmurs or gallops, 1+  peripheral edema Musculoskeletal: No deformities, no cyanosis or clubbing, bruising ,ambulates with a cane        Assessment & Plan:

## 2015-09-10 NOTE — Telephone Encounter (Signed)
Called spoke with pt. He is requesting the albuterol be sent to the [facility] in [place]. I explained to him that I would send the message to michelle to have RA address in office tomorrow because we would need RA's signature for the prescription. He voiced understanding and had no further questions.   Will forward to [facility] for follow up

## 2015-09-10 NOTE — Patient Instructions (Signed)
Use albuterol MDI 2 puffs every 6 hours as needed Use CPAP every night at least 6h Take an extra Lasix if your weight goes up by more than 2 pounds  Call us as needed if breathing worse

## 2015-09-10 NOTE — Assessment & Plan Note (Signed)
Use CPAP every night at least 6h  Weight loss encouraged, compliance with goal of at least 4-6 hrs every night is the expectation. Advised against medications with sedative side effects Cautioned against driving when sleepy - understanding that sleepiness will vary on a day to day basis

## 2015-09-10 NOTE — Assessment & Plan Note (Signed)
Use albuterol MDI 2 puffs every 6 hours as needed Take an extra Lasix if your weight goes up by more than 2 pounds  Call us as needed if breathing worse

## 2015-09-11 MED ORDER — ALBUTEROL SULFATE HFA 108 (90 BASE) MCG/ACT IN AERS: 2.0000 | INHALATION_SPRAY | Freq: Four times a day (QID) | RESPIRATORY_TRACT | 6 refills | Status: DC | PRN

## 2015-09-11 NOTE — Telephone Encounter (Signed)
Rx faxed to [facility] in [place]. Nothing further needed.

## 2015-09-15 DIAGNOSIS — D649 Anemia, unspecified: Secondary | ICD-10-CM | POA: Diagnosis not present

## 2015-09-15 DIAGNOSIS — I10 Essential (primary) hypertension: Secondary | ICD-10-CM | POA: Diagnosis not present

## 2015-09-15 DIAGNOSIS — D692 Other nonthrombocytopenic purpura: Secondary | ICD-10-CM | POA: Diagnosis not present

## 2015-09-15 DIAGNOSIS — G5622 Lesion of ulnar nerve, left upper limb: Secondary | ICD-10-CM | POA: Diagnosis not present

## 2015-09-15 DIAGNOSIS — G4700 Insomnia, unspecified: Secondary | ICD-10-CM | POA: Diagnosis not present

## 2015-09-15 DIAGNOSIS — R2689 Other abnormalities of gait and mobility: Secondary | ICD-10-CM | POA: Diagnosis not present

## 2015-09-15 DIAGNOSIS — R682 Dry mouth, unspecified: Secondary | ICD-10-CM | POA: Diagnosis not present

## 2015-09-15 DIAGNOSIS — R42 Dizziness and giddiness: Secondary | ICD-10-CM | POA: Diagnosis not present

## 2015-09-15 DIAGNOSIS — Z79899 Other long term (current) drug therapy: Secondary | ICD-10-CM | POA: Diagnosis not present

## 2015-09-18 ENCOUNTER — Telehealth: Payer: Self-pay | Admitting: Pulmonary Disease

## 2015-09-18 DIAGNOSIS — J439 Emphysema, unspecified: Secondary | ICD-10-CM

## 2015-09-18 NOTE — Telephone Encounter (Signed)
Sharyn Lull can you print out this rx for the pt and mail it to him please.  thanks

## 2015-09-19 MED ORDER — ALBUTEROL SULFATE HFA 108 (90 BASE) MCG/ACT IN AERS: 2.0000 | INHALATION_SPRAY | Freq: Four times a day (QID) | RESPIRATORY_TRACT | 6 refills | Status: DC | PRN

## 2015-09-19 NOTE — Telephone Encounter (Signed)
Rx has been signed and mailed to patient. Nothing further needed.

## 2015-09-19 NOTE — Telephone Encounter (Signed)
Spoke with pt. Advised him we would have RA sign this prescription today and we would place it in the mail to him today. Nothing further was needed.

## 2015-09-30 ENCOUNTER — Other Ambulatory Visit: Payer: Self-pay

## 2015-09-30 ENCOUNTER — Ambulatory Visit: Payer: Self-pay

## 2015-09-30 ENCOUNTER — Ambulatory Visit: Payer: Self-pay | Admitting: Oncology

## 2015-10-14 ENCOUNTER — Encounter: Payer: Self-pay | Admitting: *Deleted

## 2015-10-23 ENCOUNTER — Ambulatory Visit: Payer: Self-pay

## 2015-10-23 ENCOUNTER — Other Ambulatory Visit: Payer: Self-pay

## 2015-10-23 ENCOUNTER — Ambulatory Visit: Payer: Self-pay | Admitting: Oncology

## 2015-10-27 ENCOUNTER — Ambulatory Visit: Payer: Medicare Other | Admitting: Neurology

## 2015-10-31 ENCOUNTER — Telehealth: Payer: Self-pay | Admitting: Oncology

## 2015-10-31 NOTE — Telephone Encounter (Signed)
Spoke with pt to confirm 10/5 appt per LOS

## 2015-11-04 DIAGNOSIS — Z79891 Long term (current) use of opiate analgesic: Secondary | ICD-10-CM | POA: Diagnosis not present

## 2015-11-04 DIAGNOSIS — G894 Chronic pain syndrome: Secondary | ICD-10-CM | POA: Diagnosis not present

## 2015-11-04 DIAGNOSIS — M4316 Spondylolisthesis, lumbar region: Secondary | ICD-10-CM | POA: Diagnosis not present

## 2015-11-04 DIAGNOSIS — M4806 Spinal stenosis, lumbar region: Secondary | ICD-10-CM | POA: Diagnosis not present

## 2015-11-05 ENCOUNTER — Other Ambulatory Visit: Payer: Self-pay

## 2015-11-05 ENCOUNTER — Ambulatory Visit (INDEPENDENT_AMBULATORY_CARE_PROVIDER_SITE_OTHER): Payer: Medicare Other | Admitting: *Deleted

## 2015-11-05 ENCOUNTER — Ambulatory Visit: Payer: Self-pay | Admitting: Oncology

## 2015-11-05 ENCOUNTER — Ambulatory Visit: Payer: Self-pay

## 2015-11-05 ENCOUNTER — Telehealth: Payer: Self-pay | Admitting: Cardiology

## 2015-11-05 DIAGNOSIS — M5440 Lumbago with sciatica, unspecified side: Secondary | ICD-10-CM | POA: Diagnosis not present

## 2015-11-05 DIAGNOSIS — I495 Sick sinus syndrome: Secondary | ICD-10-CM | POA: Diagnosis not present

## 2015-11-05 DIAGNOSIS — W19XXXA Unspecified fall, initial encounter: Secondary | ICD-10-CM | POA: Diagnosis not present

## 2015-11-05 NOTE — Telephone Encounter (Signed)
Confirmed remote transmission w/ pt wife.   

## 2015-11-11 ENCOUNTER — Encounter: Payer: Self-pay | Admitting: Cardiology

## 2015-11-11 NOTE — Progress Notes (Signed)
Remote pacemaker transmission.   

## 2015-11-20 ENCOUNTER — Ambulatory Visit (HOSPITAL_BASED_OUTPATIENT_CLINIC_OR_DEPARTMENT_OTHER): Payer: Medicare Other | Admitting: Oncology

## 2015-11-20 ENCOUNTER — Other Ambulatory Visit: Payer: Self-pay | Admitting: *Deleted

## 2015-11-20 ENCOUNTER — Telehealth: Payer: Self-pay

## 2015-11-20 ENCOUNTER — Ambulatory Visit: Payer: Medicare Other

## 2015-11-20 ENCOUNTER — Other Ambulatory Visit (HOSPITAL_BASED_OUTPATIENT_CLINIC_OR_DEPARTMENT_OTHER): Payer: Medicare Other

## 2015-11-20 VITALS — BP 148/62 | HR 84 | Temp 98.1°F | Resp 18 | Ht 70.0 in | Wt 177.4 lb

## 2015-11-20 DIAGNOSIS — N289 Disorder of kidney and ureter, unspecified: Secondary | ICD-10-CM | POA: Diagnosis not present

## 2015-11-20 DIAGNOSIS — D500 Iron deficiency anemia secondary to blood loss (chronic): Secondary | ICD-10-CM

## 2015-11-20 DIAGNOSIS — D649 Anemia, unspecified: Secondary | ICD-10-CM | POA: Diagnosis not present

## 2015-11-20 DIAGNOSIS — C61 Malignant neoplasm of prostate: Secondary | ICD-10-CM | POA: Diagnosis present

## 2015-11-20 DIAGNOSIS — Z8546 Personal history of malignant neoplasm of prostate: Secondary | ICD-10-CM | POA: Diagnosis not present

## 2015-11-20 LAB — CBC WITH DIFFERENTIAL/PLATELET
BASO%: 0.4 % (ref 0.0–2.0)
BASOS ABS: 0 10*3/uL (ref 0.0–0.1)
EOS ABS: 0.1 10*3/uL (ref 0.0–0.5)
EOS%: 2.1 % (ref 0.0–7.0)
HCT: 32.6 % — ABNORMAL LOW (ref 38.4–49.9)
HGB: 10.7 g/dL — ABNORMAL LOW (ref 13.0–17.1)
LYMPH%: 21 % (ref 14.0–49.0)
MCH: 31.1 pg (ref 27.2–33.4)
MCHC: 32.8 g/dL (ref 32.0–36.0)
MCV: 94.8 fL (ref 79.3–98.0)
MONO#: 0.4 10*3/uL (ref 0.1–0.9)
MONO%: 5.8 % (ref 0.0–14.0)
NEUT#: 4.6 10*3/uL (ref 1.5–6.5)
NEUT%: 70.7 % (ref 39.0–75.0)
PLATELETS: 118 10*3/uL — AB (ref 140–400)
RBC: 3.44 10*6/uL — AB (ref 4.20–5.82)
RDW: 13.6 % (ref 11.0–14.6)
WBC: 6.5 10*3/uL (ref 4.0–10.3)
lymph#: 1.4 10*3/uL (ref 0.9–3.3)

## 2015-11-20 NOTE — Telephone Encounter (Signed)
appts made and avs  Printed   [name] [name]

## 2015-11-20 NOTE — Progress Notes (Signed)
Hematology and Oncology Follow Up Visit  [name] [name] [id number] 1924/09/20 82 y.o. 11/20/2015 4:09 PM  CC: [name] [name], M.D.  [name] [name], M.D.    Principle Diagnosis: 82 year old gentleman with the following diagnoses. 1. Multifactorial anemia.  He has anemia of chronic disease.  It may be anemia of renal insufficiency. 2. History of prostate cancer status post radiation therapy completed in 2008.  Current therapy: He is on Aranesp 300 mcg subcutaneously Intermittently to keep his hemoglobin above 10.  Interim History: [name] [name] presents today for a followup visit. Since his last visit, he had missed a few appointments because of a prolonged hospitalization after he sustained an injury in January 2017. He required elbow surgery and prolonged rehabilitation. His last Aranesp injection was in June 2017 after missing a few months of therapy. He reports really no major difference in his quality of life or performance status. His hemoglobin remained relatively stable and did not require any transfusion.  Since his recovery from this injury, he regained most activities of daily living. 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 He does report periodic dizziness but no falls or syncope.   He has not reported any headaches or blurry vision or seizure. He reports no chest pain or palpitation. He has not reported any wheezing or shortness of breath. Does have coronary nausea or vomiting or abdominal pain. Remainder of his review of systems unremarkable.   Medications: I have reviewed the patient's current medications. Current Outpatient Prescriptions  Medication Sig Dispense Refill  . albuterol (PROVENTIL HFA;VENTOLIN HFA) 108 (90 Base) MCG/ACT inhaler Inhale 2 puffs into the lungs every 6 (six) hours as needed for wheezing or shortness of breath. 1 Inhaler 6  . aspirin EC 81 MG tablet Take 81 mg by mouth daily.    . darbepoetin  alfa-polysorbate (ARANESP, ALB FREE, SURECLICK) XX123456 MCG/ML injection Inject 500 mcg into the skin See admin instructions. Every 2 months    . DULoxetine (CYMBALTA) 20 MG capsule Take 1 capsule (20 mg total) by mouth daily. 30 capsule 3  . furosemide (LASIX) 40 MG tablet Take 40 mg by mouth See admin instructions. Take 40mg  daily.  May take an additional 40mg  for leg swelling    . lisinopril (PRINIVIL,ZESTRIL) 20 MG tablet Take 1/2 tablet (10 mg) once daily at bedtime    . Multiple Vitamins-Minerals (PRESERVISION AREDS PO) Take 1 tablet by mouth 2 (two) times daily.    Marland Kitchen omeprazole (PRILOSEC) 20 MG capsule Take 20 mg by mouth daily.     Marland Kitchen oxybutynin (DITROPAN XL) 15 MG 24 hr tablet Take 15 mg by mouth every other day.     . oxyCODONE (OXY IR/ROXICODONE) 5 MG immediate release tablet Take 1-2 tablets (5-10 mg total) by mouth every 3 (three) hours as needed for moderate pain. 40 tablet 0  . Umeclidinium-Vilanterol (ANORO ELLIPTA) 62.5-25 MCG/INH AEPB Inhale 1 puff into the lungs daily.     . vitamin B-12 (CYANOCOBALAMIN) 500 MCG tablet Take 500 mcg by mouth daily.      No current facility-administered medications for this visit.     Allergies: No Known Allergies  Past Medical History, Surgical history, Social history, and Family History were reviewed and updated.    Physical Exam: Blood pressure (!) 148/62, pulse 84, temperature 98.1 F (36.7 C), temperature source Oral, resp. rate 18, height 5\' 10"  (1.778 m), weight 177 lb 6.4 oz (80.5 kg), SpO2 97 %.  ECOG: 1 General appearance: Elderly gentleman appeared without distress. Head: Normocephalic, without obvious abnormality, atraumatic Neck: no adenopathy Lymph nodes: Cervical, supraclavicular, and axillary nodes normal. Heart:regular rate and rhythm, S1, S2 normal, no murmur, click, rub or gallop Lung:chest clear, no wheezing, rales, normal symmetric air entry Abdomen: soft, non-tender, without masses or organomegaly no shifting dullness or  ascites. EXT:no erythema, induration, or nodules   Lab Results: Lab Results  Component Value Date   WBC 6.5 11/20/2015   HGB 10.7 (L) 11/20/2015   HCT 32.6 (L) 11/20/2015   MCV 94.8 11/20/2015   PLT 118 (L) 11/20/2015     Chemistry      Component Value Date/Time   NA 141 08/06/2015 1454   K 4.3 08/06/2015 1454   CL 102 08/06/2015 1454   CO2 25 08/06/2015 1454   BUN 29 (H) 08/06/2015 1454   CREATININE 1.14 (H) 08/06/2015 1454      Component Value Date/Time   CALCIUM 8.6 08/06/2015 1454   ALKPHOS 83 10/17/2012 1555   AST 21 10/17/2012 1555   ALT 11 10/17/2012 1555   BILITOT 0.4 10/17/2012 1555       Impression and Plan:  82 year old gentleman with the following issues. 1. Multifactorial anemia.  There is an element of anemia of renal insufficiency.  Currently on Aranesp 300 mcg to keep his hemoglobin between 10 and 11. He has not received any injections since June 2017 and does not really report any major changes due to that. We have discussed the risks and benefits of continuing Aranesp therapy at this time and have elected to defer any further injections unless his hemoglobin starts to drift again. 2. Prostate cancer. His PSA is being checked routinely by Dr. Karsten [name]. No evidence of relapse noted. 3. Follow-up will be in 6 months sooner if needed to.  [name] Button, MD 10/5/20174:09 PM

## 2015-11-25 ENCOUNTER — Ambulatory Visit (INDEPENDENT_AMBULATORY_CARE_PROVIDER_SITE_OTHER): Payer: Medicare Other | Admitting: Adult Health

## 2015-11-25 ENCOUNTER — Encounter: Payer: Self-pay | Admitting: Adult Health

## 2015-11-25 VITALS — BP 154/75 | HR 81 | Ht 70.0 in | Wt 177.0 lb

## 2015-11-25 DIAGNOSIS — R42 Dizziness and giddiness: Secondary | ICD-10-CM | POA: Diagnosis not present

## 2015-11-25 NOTE — Patient Instructions (Signed)
If your symptoms worsen or you develop new symptoms please let us know.   

## 2015-11-25 NOTE — Progress Notes (Signed)
I have read the note, and I agree with the clinical assessment and plan.  WILLIS,CHARLES KEITH   

## 2015-11-25 NOTE — Progress Notes (Signed)
PATIENT: [name] [name] DOB: [birth year]  REASON FOR VISIT: follow up- dizziness HISTORY FROM: patient  HISTORY OF PRESENT ILLNESS: [name] [name] is an 82 year old male with a history of dizziness. He returns today for follow-up. He was started on low-dose Cymbalta and reports that he was unable to tolerate this medication. He states he took medication for approximately 30-40 days but felt that the medication made him feel worse. He states that his dizziness can occur randomly throughout the day. He does note that it tends to occur more with activities. He describes this dizziness as feeling "off balance." He does report that he has significant back pain and will occasionally use Percocet. He does feel that there may be a correlation between his back pain and dizziness. He denies having a headache with the dizziness. Does not feel that it occurs only with position changes. He also has peripheral neuropathy primarily in the feet. He does report some discomfort but uses topical cream with good benefit. Reports that gait and balances slightly unsteady. He does use a cane when ambulating. He returns today for evaluation.  HISTORY 06/26/15: [name] [name] is an 82 year old right-handed white male with a history of a fall with a left elbow fracture that occurred in January 2017. The patient has been followed by [name] [name] for this, and the patient indicates that he has had some weakness in the left hand following a fall. The patient has gradually improved with some of the weakness and numbness in the left hand. He has been dropping things from the left hand. He denies any significant neck pain, but does have some mid back pain. He has also had left carpal tunnel syndrome surgery recently. The patient is slowly regaining strength and sensation in the left hand, he indicates that he does have an elbow pad to protect the left elbow. His main concern today is that he has chronic issues with dizziness. The  dizziness is associated with a lightheaded floaty feeling, and is generally much better in the morning, worse as the day goes on. The lightheaded sensations occur when he is standing, and get much better with sitting or lying down. The patient has not had any fainting episodes. He feels unsteady on his feet, he feels as if he is dragging his right leg. He does have some chronic low back pain, he uses a cane for ambulation. He reports that if he stands still, he may have a tendency to fall backwards. The patient is getting physical therapy for the left hand, in the past physical therapy did not help his ability to ambulate. The dizziness has been present for several years at this point. He did have a CT scan of the head done in the fall of 2016 that was relatively unremarkable. The patient has a pacemaker in place, he cannot have MRI evaluation.  REVIEW OF SYSTEMS: Out of a complete 14 system review of symptoms, the patient complains only of the following symptoms, and all other reviewed systems are negative.  Insomnia, walking difficulty, back pain, weakness, dizziness, shortness of breath, leg swelling, hearing loss, ringing in ears  ALLERGIES: No Known Allergies  HOME MEDICATIONS: Outpatient Medications Prior to Visit  Medication Sig Dispense Refill  . albuterol (PROVENTIL HFA;VENTOLIN HFA) 108 (90 Base) MCG/ACT inhaler Inhale 2 puffs into the lungs every 6 (six) hours as needed for wheezing or shortness of breath. 1 Inhaler 6  . aspirin EC 81 MG tablet Take 81 mg by mouth daily.    Marland Kitchen  darbepoetin alfa-polysorbate (ARANESP, ALB FREE, SURECLICK) [id number] MCG/ML injection Inject 500 mcg into the skin See admin instructions. Every 2 months    . DULoxetine (CYMBALTA) 20 MG capsule Take 1 capsule (20 mg total) by mouth daily. 30 capsule 3  . furosemide (LASIX) 40 MG tablet Take 40 mg by mouth See admin instructions. Take 40mg  daily.  May take an additional 40mg  for leg swelling    . lisinopril  (PRINIVIL,ZESTRIL) 20 MG tablet Take 1/2 tablet (10 mg) once daily at bedtime    . Multiple Vitamins-Minerals (PRESERVISION AREDS PO) Take 1 tablet by mouth 2 (two) times daily.    Marland Kitchen omeprazole (PRILOSEC) 20 MG capsule Take 20 mg by mouth daily.     Marland Kitchen oxybutynin (DITROPAN XL) 15 MG 24 hr tablet Take 15 mg by mouth every other day.     . oxyCODONE (OXY IR/ROXICODONE) 5 MG immediate release tablet Take 1-2 tablets (5-10 mg total) by mouth every 3 (three) hours as needed for moderate pain. 40 tablet 0  . Umeclidinium-Vilanterol (ANORO ELLIPTA) 62.5-25 MCG/INH AEPB Inhale 1 puff into the lungs daily.     . vitamin B-12 (CYANOCOBALAMIN) 500 MCG tablet Take 500 mcg by mouth daily.      No facility-administered medications prior to visit.     PAST MEDICAL HISTORY: Past Medical History:  Diagnosis Date  . Abnormality of gait 06/26/2015  . Anemia    occ. injections for this at [place] [facility]  . Arthritis    "back"  . Chronic lower back pain   . COPD (chronic obstructive pulmonary disease) ([place]) 10/19/2011   "touch"  . Dizziness and giddiness 06/26/2015  . Exertional dyspnea 10/19/2011  . GERD (gastroesophageal reflux disease)   . Hearing loss   . History of blood transfusion    "think it was related to pacemaker placement"  . Hypertension   . Leg swelling 10/19/2011   LLE  . OSA on CPAP 10/19/2011   "sometimes wear CPAP"  . Pacemaker    Medtronic  . Prostate cancer ([place])    S/P radiation tx ~ 2008  . Skin change    12-24-14 few scalp lesions burned off"old age spots"    PAST SURGICAL HISTORY: Past Surgical History:  Procedure Laterality Date  . BALLOON DILATION N/A 01/14/2015   Procedure: BALLOON DILATION;  Surgeon: Garlan Fair, MD;  Location: [facility];  Service: Endoscopy;  Laterality: N/A;  . CARDIAC CATHETERIZATION    . CARPAL TUNNEL RELEASE Left 11/14/2014   Procedure: CARPAL TUNNEL RELEASE LEFT THUMB;  Surgeon: Daryll Brod, MD;  Location: [facility];  Service:  Orthopedics;  Laterality: Left;  ANESTHESIA:  IV REGIONAL FAB  . CATARACT EXTRACTION W/ INTRAOCULAR LENS  IMPLANT, BILATERAL    . CHOLECYSTECTOMY  10/23/2011  . CHOLECYSTECTOMY  10/23/2011   Procedure: CHOLECYSTECTOMY;  Surgeon: Rolm Bookbinder, MD;  Location: [facility];  Service: General;  Laterality: N/A;  with intraoperative cholangiogram  . ESOPHAGOGASTRODUODENOSCOPY (EGD) WITH PROPOFOL N/A 01/14/2015   Procedure: ESOPHAGOGASTRODUODENOSCOPY (EGD) WITH PROPOFOL;  Surgeon: Garlan Fair, MD;  [facility]: [facility];  Service: Endoscopy;  Laterality: N/A;  . EYE SURGERY Bilateral    cataract  . HERNIA REPAIR  ~ 2010;    lap; [facility]  . HERNIA REPAIR  ~ [id number]   lap vh complicated by recurrence  . HERNIA REPAIR  09/2011   open; V[facility]  . I&D EXTREMITY Left 10/20/2012   Procedure: LEFT LEG IRRIGATION AND DEBRIDEMENT, AND WOUND VAC APPLICATION;  Surgeon:  Naiping Eduard Roux, MD;  Location: [facility];  Service: Orthopedics;  Laterality: Left;  . I&D EXTREMITY Left 10/22/2012   Procedure: IRRIGATION AND DEBRIDEMENT EXTREMITY;  Surgeon: Marianna Payment, MD;  [facility];  Service: Orthopedics;  Laterality: Left;  . INSERT / REPLACE / REMOVE PACEMAKER  ~ 2009   initial placement  . JOINT REPLACEMENT    . ORIF ELBOW FRACTURE Left 03/29/2015   Procedure: LEFT ELBOW OPEN REDUCTION INTERNAL FIXATION (ORIF) DISTAL HUMERUS FRACTURE WITH OLECRANON OSTEOTOMY AND ULNAR NERVE RELEASE;  Surgeon: Roseanne Kaufman, MD;  Location: [facility];  Service: Orthopedics;  Laterality: Left;  . SKIN SPLIT GRAFT Left 10/22/2012   Procedure: SKIN GRAFT SPLIT THICKNESS;  Surgeon: Marianna Payment, MD;  [facility];  Service: Orthopedics;  Laterality: Left;  . TOTAL KNEE ARTHROPLASTY  1990's   right    FAMILY HISTORY: Family History  Problem Relation Age of Onset  . Heart disease Father   . Heart disease Mother   . Colon cancer Mother   . Heart attack Neg Hx   . Hypertension Neg Hx   . Stroke Mother     SOCIAL  HISTORY: Social History   Social History  . Marital status: Married    Spouse name: martha  . Number of children: 3  . Years of education: 12   Occupational History  . 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 Smoking status: Former Smoker    Packs/day: 1.50    Years: 35.00    Types: Cigarettes    Quit date: 02/15/1973  . Smokeless tobacco: Never Used  . Alcohol use No  . Drug use: No  . Sexual activity: No   Other Topics Concern  . Not on file   Social History Narrative   Lives at home w/ his wife, [name]   Right-handed   Drinks about 1 soda per day, decaf coffee      PHYSICAL EXAM  Vitals:   11/25/15 1345  BP: (!) 154/75  Pulse: 81  Weight: 177 lb (80.3 kg)  Height: 5\' 10"  (1.778 m)   Body mass index is 25.4 kg/m.  Generalized: Well developed, in no acute distress   Neurological examination  Mentation: Alert oriented to time, place, history taking. Follows all commands speech and language fluent Cranial nerve II-XII: Pupils were equal round reactive to light. Extraocular movements were full, visual field were full on confrontational test. Facial sensation and strength were normal. Uvula tongue midline. Head turning and shoulder shrug  were normal and symmetric. Motor: The motor testing reveals 5 over 5 strength of all 4 extremities. Good symmetric motor tone is noted throughout.  Sensory: Sensory testing is intact to soft touch on all 4 extremities. No evidence of extinction is noted.  Coordination: Cerebellar testing reveals good finger-nose-finger and heel-to-shin bilaterally.  Gait and station: Gait is slightly wide-based. Uses a cane when ambulating. Tandem gait not attempted. Reflexes: Deep tendon reflexes are symmetric and normal bilaterally.   DIAGNOSTIC DATA (LABS, IMAGING, TESTING) - I reviewed patient records, labs, notes, testing and imaging myself where available.  Lab Results  Component Value Date    WBC 6.5 11/20/2015   HGB 10.7 (L) 11/20/2015   HCT 32.6 (L) 11/20/2015   MCV 94.8 11/20/2015   PLT 118 (L) 11/20/2015      Component Value Date/Time   NA 141 08/06/2015 1454   K 4.3 08/06/2015 1454   CL 102 08/06/2015  1454   CO2 25 08/06/2015 1454   GLUCOSE 95 08/06/2015 1454   BUN 29 (H) 08/06/2015 1454   CREATININE 1.14 (H) 08/06/2015 1454   CALCIUM 8.6 08/06/2015 1454   PROT 6.5 10/17/2012 1555   ALBUMIN 3.2 (L) 10/17/2012 1555   AST 21 10/17/2012 1555   ALT 11 10/17/2012 1555   ALKPHOS 83 10/17/2012 1555   BILITOT 0.4 10/17/2012 1555   GFRNONAA 59 (L) 03/31/2015 0357   GFRAA >60 03/31/2015 0357       ASSESSMENT AND PLAN 82 y.o. year old male  has a past medical history of Abnormality of gait (06/26/2015); Anemia; Arthritis; Chronic lower back pain; COPD (chronic obstructive pulmonary disease) ([place]) (10/19/2011); Dizziness and giddiness (06/26/2015); Exertional dyspnea (10/19/2011); GERD (gastroesophageal reflux disease); Hearing loss; History of blood transfusion; Hypertension; Leg swelling (10/19/2011); OSA on CPAP (10/19/2011); Pacemaker; Prostate cancer ([place]); and Skin change. here with:  1. Dizziness  I explained to the patient that their does not appear to be a clear neurological etiology for his dizziness. His dizziness could in fact be multifactorial. I don't think initiating any new medications would offer him much benefit. Patient agrees with this plan. For now he will continue to monitor the dizziness. If his symptoms worsen or he develops any new symptoms he will let us know. Follow-up in 6 months with Dr. Jannifer Franklin.   Ward Givens, MSN, NP-C 11/25/2015, 1:43 PM [facility] [address], [place], [place] [address] [phone]

## 2015-11-26 ENCOUNTER — Ambulatory Visit (INDEPENDENT_AMBULATORY_CARE_PROVIDER_SITE_OTHER): Payer: Medicare Other | Admitting: Orthopedic Surgery

## 2015-11-26 DIAGNOSIS — M75122 Complete rotator cuff tear or rupture of left shoulder, not specified as traumatic: Secondary | ICD-10-CM

## 2015-11-26 DIAGNOSIS — M75121 Complete rotator cuff tear or rupture of right shoulder, not specified as traumatic: Secondary | ICD-10-CM

## 2015-11-26 DIAGNOSIS — M7542 Impingement syndrome of left shoulder: Secondary | ICD-10-CM

## 2015-11-26 DIAGNOSIS — M7541 Impingement syndrome of right shoulder: Secondary | ICD-10-CM | POA: Diagnosis not present

## 2015-11-26 DIAGNOSIS — Z23 Encounter for immunization: Secondary | ICD-10-CM | POA: Diagnosis not present

## 2015-11-27 ENCOUNTER — Ambulatory Visit (INDEPENDENT_AMBULATORY_CARE_PROVIDER_SITE_OTHER): Payer: Medicare Other

## 2015-11-27 ENCOUNTER — Encounter: Payer: Self-pay | Admitting: Podiatry

## 2015-11-27 ENCOUNTER — Ambulatory Visit (INDEPENDENT_AMBULATORY_CARE_PROVIDER_SITE_OTHER): Payer: Medicare Other | Admitting: Podiatry

## 2015-11-27 VITALS — BP 159/80 | HR 70 | Resp 16

## 2015-11-27 DIAGNOSIS — L03032 Cellulitis of left toe: Secondary | ICD-10-CM

## 2015-11-27 DIAGNOSIS — M25572 Pain in left ankle and joints of left foot: Secondary | ICD-10-CM

## 2015-11-27 DIAGNOSIS — M779 Enthesopathy, unspecified: Secondary | ICD-10-CM | POA: Diagnosis not present

## 2015-11-27 DIAGNOSIS — M25571 Pain in right ankle and joints of right foot: Secondary | ICD-10-CM | POA: Diagnosis not present

## 2015-11-27 LAB — CUP PACEART REMOTE DEVICE CHECK
Battery Remaining Longevity: 44 mo
Brady Statistic AP VP Percent: 7 %
Brady Statistic AS VP Percent: 1 %
Implantable Lead Implant Date: 20080826
Implantable Lead Location: 753860
Implantable Lead Model: 5076
Implantable Lead Model: 5076
Lead Channel Pacing Threshold Amplitude: 0.75 V
Lead Channel Pacing Threshold Amplitude: 1.25 V
Lead Channel Pacing Threshold Pulse Width: 0.4 ms
Lead Channel Sensing Intrinsic Amplitude: 5.6 mV
Lead Channel Setting Pacing Amplitude: 2.5 V
Lead Channel Setting Sensing Sensitivity: 2 mV
MDC IDC LEAD IMPLANT DT: 20080826
MDC IDC LEAD LOCATION: 753859
MDC IDC MSMT BATTERY IMPEDANCE: 1075 Ohm
MDC IDC MSMT BATTERY VOLTAGE: 2.76 V
MDC IDC MSMT LEADCHNL RA IMPEDANCE VALUE: 451 Ohm
MDC IDC MSMT LEADCHNL RA SENSING INTR AMPL: 2.8 mV
MDC IDC MSMT LEADCHNL RV IMPEDANCE VALUE: 590 Ohm
MDC IDC MSMT LEADCHNL RV PACING THRESHOLD PULSEWIDTH: 0.4 ms
MDC IDC SESS DTM: 20170920185111
MDC IDC SET LEADCHNL RA PACING AMPLITUDE: 1.5 V
MDC IDC SET LEADCHNL RV PACING PULSEWIDTH: 0.4 ms
MDC IDC STAT BRADY AP VS PERCENT: 71 %
MDC IDC STAT BRADY AS VS PERCENT: 21 %

## 2015-11-27 MED ORDER — TRIAMCINOLONE ACETONIDE 10 MG/ML IJ SUSP
10.0000 mg | Freq: Once | INTRAMUSCULAR | Status: AC
  Administered 2015-11-27: 10 mg

## 2015-11-27 NOTE — Progress Notes (Signed)
Subjective:     Patient ID: [name] [name], male   DOB: Oct 08, 1924, 82 y.o.   MRN: [id number]  HPI patient states I'm getting pain in my left big toenail with redness and slight drainage and I am getting pain in the ankles of both feet   Review of Systems     Objective:   Physical Exam Neurovascular status intact muscle strength adequate with inflammatory changes within the subtalar joint bilateral with fluid buildup noted and also incurvated left hallux lateral border with redness and distal drainage with no proximal edema erythema drainage noted    Assessment:     Paronychia infection left hallux with drainage and arthritis of the ankle with inflammation and capsulitis subtalar joint bilateral    Plan:     H&P x-rays reviewed and injected the sinus tarsi bilateral 3 mg Kenalog 5 mill grams Xylocaine infiltrated left hallux 60 minutes I can Marcaine mixture removing lateral border removed all distal proud flesh abscess tissue and created a channel for drainage and applied sterile dressing with instructions on soaks. Reappoint to recheck

## 2015-12-15 ENCOUNTER — Ambulatory Visit (INDEPENDENT_AMBULATORY_CARE_PROVIDER_SITE_OTHER): Payer: Medicare Other | Admitting: Orthopedic Surgery

## 2015-12-15 VITALS — Ht 70.0 in | Wt 180.0 lb

## 2015-12-15 DIAGNOSIS — S61452A Open bite of left hand, initial encounter: Secondary | ICD-10-CM | POA: Diagnosis not present

## 2015-12-15 DIAGNOSIS — W540XXA Bitten by dog, initial encounter: Secondary | ICD-10-CM | POA: Diagnosis not present

## 2015-12-15 MED ORDER — MUPIROCIN 2 % EX OINT: 1.0000 "application " | TOPICAL_OINTMENT | Freq: Two times a day (BID) | CUTANEOUS | 6 refills | Status: DC

## 2015-12-15 NOTE — Progress Notes (Signed)
Office Visit Note   Patient: [name] [name]           Date of Birth: [birth year]           MRN: [id number] Visit Date: 12/15/2015              Requested by: [name], MD [place], [place] [address] PCP: Anthoney Harada, MD   Assessment & Plan: Visit Diagnoses:  1. Dog bite, hand, left, initial encounter     Plan: Bactroban ointment applied twice a day with dialysis of cleansing. A mouth compression sleeve was applied to decrease swelling and edema.  Follow-Up Instructions: Return if symptoms worsen or fail to improve.   Orders:  No orders of the defined types were placed in this encounter.  Meds ordered this encounter  Medications  . mupirocin ointment (BACTROBAN) 2 %    Sig: Apply 1 application topically 2 (two) times daily.    Dispense:  22 g    Refill:  6      Procedures: No procedures performed   Clinical Data: No additional findings.   Subjective: Chief Complaint  Patient presents with  . Left Hand - Injury    DOI 12/13/15 dog bite    " me and my puppy got into it" DOI 12/13/15 pt sustained a dog bite to his left hand at lateral side. I red, swollen and very tender to touch. Pt has dressed with a dry dressing. There is spot bloody drain and the skin has been punctured with a crescent shaped above the 4th and 5th knuckle.     Review of Systems   Objective: Vital Signs: Ht 5\' 10"  (1.778 m)   Wt 180 lb (81.6 kg)   BMI 25.83 kg/m   Physical Exam Patient is alert oriented no adenopathy well-dressed normal affect normal respiratory effort.    Examination the left upper extremity patient has a few small lacerations from the dog bite. There is no cellulitis no odor no drainage no tenderness to palpation no signs of infection he moves his finger well no signs of flexor or extensor tenosynovium right is.  Ortho Exam  Specialty Comments:  No specialty comments available.  Imaging: No results found.   PMFS  History: Patient Active Problem List   Diagnosis Date Noted  . Dizziness and giddiness 06/26/2015  . Abnormality of gait 06/26/2015  . Anxiety 04/14/2015  . GERD (gastroesophageal reflux disease) 04/03/2015  . OSA (obstructive sleep apnea) 04/03/2015  . HLD (hyperlipidemia) 04/03/2015  . SOB (shortness of breath)   . Left elbow fracture 03/29/2015  . Dizziness 01/01/2015  . LBP (low back pain) 01/01/2015  . Malignant neoplasm of prostate ([place]) 01/01/2015  . BP (high blood pressure) 01/01/2015  . DOE (dyspnea on exertion) 10/26/2013  . Essential hypertension, benign 10/26/2013  . Post-traumatic wound infection 10/17/2012  . Cellulitis 10/17/2012  . Atrial fibrillation ([place]) 10/22/2011  . DVT, lower extremity, distal, chronic ([place]) 10/22/2011  . Pacemaker 10/19/2011  . Prostate cancer ([place]) 10/19/2011  . COPD (chronic obstructive pulmonary disease) with emphysema ([place]) 02/04/2011  . Anemia 12/10/2010   Past Medical History:  Diagnosis Date  . Abnormality of gait 06/26/2015  . Anemia    occ. injections for this at [place] [facility]  . Arthritis    "back"  . Chronic lower back pain   . COPD (chronic obstructive pulmonary disease) ([place]) 10/19/2011   "touch"  . Dizziness and giddiness 06/26/2015  . Exertional dyspnea 10/19/2011  .  GERD (gastroesophageal reflux disease)   . Hearing loss   . History of blood transfusion    "think it was related to pacemaker placement"  . Hypertension   . Leg swelling 10/19/2011   LLE  . OSA on CPAP 10/19/2011   "sometimes wear CPAP"  . Pacemaker    Medtronic  . Prostate cancer ([place])    S/P radiation tx ~ 2008  . Skin change    12-24-14 few scalp lesions burned off"old age spots"    Family History  Problem Relation Age of Onset  . Heart disease Father   . Heart disease Mother   . Colon cancer Mother   . Stroke Mother   . Heart attack Neg Hx   . Hypertension Neg Hx     Past Surgical History:  Procedure Laterality Date  . BALLOON DILATION  N/A 01/14/2015   Procedure: BALLOON DILATION;  Surgeon: Garlan Fair, MD;  Location: [facility];  Service: Endoscopy;  Laterality: N/A;  . CARDIAC CATHETERIZATION    . CARPAL TUNNEL RELEASE Left 11/14/2014   Procedure: CARPAL TUNNEL RELEASE LEFT THUMB;  Surgeon: Daryll Brod, MD;  Location: [facility];  Service: Orthopedics;  Laterality: Left;  ANESTHESIA:  IV REGIONAL FAB  . CATARACT EXTRACTION W/ INTRAOCULAR LENS  IMPLANT, BILATERAL    . CHOLECYSTECTOMY  10/23/2011  . CHOLECYSTECTOMY  10/23/2011   Procedure: CHOLECYSTECTOMY;  Surgeon: Rolm Bookbinder, MD;  Location: [facility];  Service: General;  Laterality: N/A;  with intraoperative cholangiogram  . ESOPHAGOGASTRODUODENOSCOPY (EGD) WITH PROPOFOL N/A 01/14/2015   Procedure: ESOPHAGOGASTRODUODENOSCOPY (EGD) WITH PROPOFOL;  Surgeon: Garlan Fair, MD;  [facility];  Service: Endoscopy;  Laterality: N/A;  . EYE SURGERY Bilateral    cataract  . HERNIA REPAIR  ~ 2010;    lap; [facility]  . HERNIA REPAIR  ~ [id number]   lap vh complicated by recurrence  . HERNIA REPAIR  09/2011   open; [facility]  . I&D EXTREMITY Left 10/20/2012   Procedure: LEFT LEG IRRIGATION AND DEBRIDEMENT, AND WOUND VAC APPLICATION;  Surgeon: Marianna Payment, MD;  [facility];  Service: Orthopedics;  Laterality: Left;  . I&D EXTREMITY Left 10/22/2012   Procedure: IRRIGATION AND DEBRIDEMENT EXTREMITY;  Surgeon: Marianna Payment, MD;  [facility];  Service: Orthopedics;  Laterality: Left;  . INSERT / REPLACE / REMOVE PACEMAKER  ~ 2009   initial placement  . JOINT REPLACEMENT    . ORIF ELBOW FRACTURE Left 03/29/2015   Procedure: LEFT ELBOW OPEN REDUCTION INTERNAL FIXATION (ORIF) DISTAL HUMERUS FRACTURE WITH OLECRANON OSTEOTOMY AND ULNAR NERVE RELEASE;  Surgeon: Roseanne Kaufman, MD;  Location: [facility];  Service: Orthopedics;  Laterality: Left;  . SKIN SPLIT GRAFT Left 10/22/2012   Procedure: SKIN GRAFT SPLIT THICKNESS;  Surgeon: Marianna Payment, MD;  Location:  [facility];  Service: Orthopedics;  Laterality: Left;  . TOTAL KNEE ARTHROPLASTY  1990's   right   Social History   Occupational History  . 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 Smoking status: Former Smoker    Packs/day: 1.50    Years: 35.00    Types: Cigarettes    Quit date: 02/15/1973  . Smokeless tobacco: Never Used  . Alcohol use No  . Drug use: No  . Sexual activity: No

## 2015-12-16 DIAGNOSIS — G894 Chronic pain syndrome: Secondary | ICD-10-CM | POA: Diagnosis not present

## 2015-12-16 DIAGNOSIS — Z79891 Long term (current) use of opiate analgesic: Secondary | ICD-10-CM | POA: Diagnosis not present

## 2015-12-16 DIAGNOSIS — M4316 Spondylolisthesis, lumbar region: Secondary | ICD-10-CM | POA: Diagnosis not present

## 2015-12-18 ENCOUNTER — Ambulatory Visit (INDEPENDENT_AMBULATORY_CARE_PROVIDER_SITE_OTHER): Payer: Medicare Other

## 2015-12-18 ENCOUNTER — Encounter (INDEPENDENT_AMBULATORY_CARE_PROVIDER_SITE_OTHER): Payer: Self-pay | Admitting: Orthopedic Surgery

## 2015-12-18 ENCOUNTER — Ambulatory Visit (INDEPENDENT_AMBULATORY_CARE_PROVIDER_SITE_OTHER): Payer: Medicare Other | Admitting: Orthopedic Surgery

## 2015-12-18 VITALS — Ht 70.0 in | Wt 180.0 lb

## 2015-12-18 DIAGNOSIS — S50312A Abrasion of left elbow, initial encounter: Secondary | ICD-10-CM | POA: Diagnosis not present

## 2015-12-18 DIAGNOSIS — S0101XA Laceration without foreign body of scalp, initial encounter: Secondary | ICD-10-CM

## 2015-12-18 DIAGNOSIS — M545 Low back pain, unspecified: Secondary | ICD-10-CM

## 2015-12-18 DIAGNOSIS — L02512 Cutaneous abscess of left hand: Secondary | ICD-10-CM | POA: Diagnosis not present

## 2015-12-18 DIAGNOSIS — M25522 Pain in left elbow: Secondary | ICD-10-CM | POA: Diagnosis not present

## 2015-12-18 DIAGNOSIS — L089 Local infection of the skin and subcutaneous tissue, unspecified: Secondary | ICD-10-CM | POA: Diagnosis not present

## 2015-12-18 MED ORDER — DOXYCYCLINE HYCLATE 100 MG PO TABS: 100.0000 mg | ORAL_TABLET | Freq: Two times a day (BID) | ORAL | 0 refills | Status: DC

## 2015-12-18 NOTE — Progress Notes (Signed)
Wound Care Note   Patient: [name] [name]           Date of Birth: [birth year]           MRN: [id number]             PCP: Anthoney Harada, MD Visit Date: 12/18/2015   Assessment & Plan: Visit Diagnoses:  1. Abscess of dorsum of hand, left   2. Pain in left elbow   3. Acute bilateral low back pain without sciatica   4. Abrasion, elbow with infection, left, initial encounter   5. Scalp laceration, initial encounter     Plan: Follow-up on Monday start wound care tomorrow doxycycline Bactroban dressing changes daily call sooner if there is any changes.   Follow-Up Instructions: Return in about 4 days (around 12/22/2015).  Orders:  Orders Placed This Encounter  Procedures  . XR Elbow 2 Views Left  . XR Lumbar Spine 2-3 Views   Meds ordered this encounter  Medications  . doxycycline (VIBRA-TABS) 100 MG tablet    Sig: Take 1 tablet (100 mg total) by mouth 2 (two) times daily.    Dispense:  60 tablet    Refill:  0      Procedures: No notes on file   Clinical Data: No additional findings.   No images are attached to the encounter.   Subjective: Chief Complaint  Patient presents with  . Left Hand - Pain, Edema    dog bite last week  . Left Elbow - Injury, Pain    S/p fall 12/17/15 in yard winding up hose tripped and hit the side of the brick house  . Lower Back - Injury, Pain    Pt in last week for left hand-" me and my puppy got into it" DOI 12/13/15 pt sustained a dog bite to his left hand at lateral side. I red, swollen and very tender to touch. Pt has dressed with a dry dressing. There is spot bloody drain and the skin has been punctured with a crescent shaped above the 4th and 5th knuckle. The hand is very swollen and red and painful. No oral ABX but using a Vive compression sleeve just over the hand and bactoban ointment to the wound.  Now in today s/p a fall yesterday DOI 12/17/15 fell in yard and hit head and elbow and back against the brick house. Skin  tear to left elbow decreased ROM and swelling. The pt's lower back is painful transverse hip pain and painful ambulation.    Review of Systems  Miscellaneous:  -Home Health Care: N/A  -Physical Therapy: N/A  -Out of Work?: N/A  -Worker's Compensation Case?: N/A  -Additional Information: N/A   Objective: Vital Signs: Ht 5\' 10"  (1.778 m)   Wt 180 lb (81.6 kg)   BMI 25.83 kg/m   Physical Exam: On examination patient is alert oriented 9 appendectomy well-dressed normal affect and was transferred he does have antalgic gait. Examination he has a acute laceration over his scalp this has been repaired with Steri-Strips there is no cellulitis no drainage no signs of infection. Patient has a new abrasion over his left elbow from a recent fall. 2 view radiographs left elbow show stable internal fixation with no complicating features no loss of reduction of his internal fixation. Patient also has lower back pain. 2 view radiographs shows advanced degenerative disc disease lumbar spine no acute fractures he does have a calcified aorta which is less than 4 cm in diameter no evidence  of an aneurysm. Examination the left hand he has a new abscess from a dog bite over the dorsum of the left hand over the ring finger just proximal to the MCP joint. After informed consent and sterile prepping patient underwent a local anesthetic with 1 mL of 1% lidocaine plain after adequate levels anesthesia obtained an incision was made dorsally approximately 2 cm in length. Purulent material was drained this was sent for cultures. This was irrigated and direct open with a iodoform gauze. A sterile dressing was applied. Patient had his ring finger wrapped with iodoform gauze to remove the ring without complications. Patient was given a prescription for doxycycline. Follow-up on Monday start wound care tomorrow.  Specialty Comments: No specialty comments available.   PMFS History: Patient Active Problem List   Diagnosis  Date Noted  . Dizziness and giddiness 06/26/2015  . Abnormality of gait 06/26/2015  . Anxiety 04/14/2015  . GERD (gastroesophageal reflux disease) 04/03/2015  . OSA (obstructive sleep apnea) 04/03/2015  . HLD (hyperlipidemia) 04/03/2015  . SOB (shortness of breath)   . Left elbow fracture 03/29/2015  . Dizziness 01/01/2015  . LBP (low back pain) 01/01/2015  . Malignant neoplasm of prostate ([place]) 01/01/2015  . BP (high blood pressure) 01/01/2015  . DOE (dyspnea on exertion) 10/26/2013  . Essential hypertension, benign 10/26/2013  . Post-traumatic wound infection 10/17/2012  . Cellulitis 10/17/2012  . Atrial fibrillation ([place]) 10/22/2011  . DVT, lower extremity, distal, chronic ([place]) 10/22/2011  . Pacemaker 10/19/2011  . Prostate cancer ([place]) 10/19/2011  . COPD (chronic obstructive pulmonary disease) with emphysema ([place]) 02/04/2011  . Anemia 12/10/2010   Past Medical History:  Diagnosis Date  . Abnormality of gait 06/26/2015  . Anemia    occ. injections for this at [facility]  . Arthritis    "back"  . Chronic lower back pain   . COPD (chronic obstructive pulmonary disease) ([place]) 10/19/2011   "touch"  . Dizziness and giddiness 06/26/2015  . Exertional dyspnea 10/19/2011  . GERD (gastroesophageal reflux disease)   . Hearing loss   . History of blood transfusion    "think it was related to pacemaker placement"  . Hypertension   . Leg swelling 10/19/2011   LLE  . OSA on CPAP 10/19/2011   "sometimes wear CPAP"  . Pacemaker    Medtronic  . Prostate cancer ([place])    S/P radiation tx ~ 2008  . Skin change    12-24-14 few scalp lesions burned off"old age spots"    Family History  Problem Relation Age of Onset  . Heart disease Father   . Heart disease Mother   . Colon cancer Mother   . Stroke Mother   . Heart attack Neg Hx   . Hypertension Neg Hx    Past Surgical History:  Procedure Laterality Date  . BALLOON DILATION N/A 01/14/2015   Procedure: BALLOON DILATION;   Surgeon: Garlan Fair, MD;  Location: [facility];  Service: Endoscopy;  Laterality: N/A;  . CARDIAC CATHETERIZATION    . CARPAL TUNNEL RELEASE Left 11/14/2014   Procedure: CARPAL TUNNEL RELEASE LEFT THUMB;  Surgeon: Daryll Brod, MD;  Location: [facility];  Service: Orthopedics;  Laterality: Left;  ANESTHESIA:  IV REGIONAL FAB  . CATARACT EXTRACTION W/ INTRAOCULAR LENS  IMPLANT, BILATERAL    . CHOLECYSTECTOMY  10/23/2011  . CHOLECYSTECTOMY  10/23/2011   Procedure: CHOLECYSTECTOMY;  Surgeon: Rolm Bookbinder, MD;  Location: [facility];  Service: General;  Laterality: N/A;  with intraoperative  cholangiogram  . ESOPHAGOGASTRODUODENOSCOPY (EGD) WITH PROPOFOL N/A 01/14/2015   Procedure: ESOPHAGOGASTRODUODENOSCOPY (EGD) WITH PROPOFOL;  Surgeon: Garlan Fair, MD;  [facility];  Service: Endoscopy;  Laterality: N/A;  . EYE SURGERY Bilateral    cataract  . HERNIA REPAIR  ~ 2010;    lap; VHR  . HERNIA REPAIR  ~ [id number]   lap vh complicated by recurrence  . HERNIA REPAIR  09/2011   open; VHR  . I&D EXTREMITY Left 10/20/2012   Procedure: LEFT LEG IRRIGATION AND DEBRIDEMENT, AND WOUND VAC APPLICATION;  Surgeon: Marianna Payment, MD;  [facility];  Service: Orthopedics;  Laterality: Left;  . I&D EXTREMITY Left 10/22/2012   Procedure: IRRIGATION AND DEBRIDEMENT EXTREMITY;  Surgeon: Marianna Payment, MD;  [facility];  Service: Orthopedics;  Laterality: Left;  . INSERT / REPLACE / REMOVE PACEMAKER  ~ 2009   initial placement  . JOINT REPLACEMENT    . ORIF ELBOW FRACTURE Left 03/29/2015   Procedure: LEFT ELBOW OPEN REDUCTION INTERNAL FIXATION (ORIF) DISTAL HUMERUS FRACTURE WITH OLECRANON OSTEOTOMY AND ULNAR NERVE RELEASE;  Surgeon: Roseanne Kaufman, MD;  Location: [facility];  Service: Orthopedics;  Laterality: Left;  . SKIN SPLIT GRAFT Left 10/22/2012   Procedure: SKIN GRAFT SPLIT THICKNESS;  Surgeon: Marianna Payment, MD;  [facility];  Service: Orthopedics;  Laterality:  Left;  . TOTAL KNEE ARTHROPLASTY  1990's   right   Social History   Occupational History  . 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 Smoking status: Former Smoker    Packs/day: 1.50    Years: 35.00    Types: Cigarettes    Quit date: 02/15/1973  . Smokeless tobacco: Never Used  . Alcohol use No  . Drug use: No  . Sexual activity: No

## 2015-12-18 NOTE — Addendum Note (Signed)
Addended by: Pamella Pert on: 12/18/2015 03:14 PM   Modules accepted: Orders

## 2015-12-21 LAB — WOUND CULTURE
GRAM STAIN: NONE SEEN
Gram Stain: NONE SEEN
ORGANISM ID, BACTERIA: NORMAL

## 2015-12-22 ENCOUNTER — Encounter (INDEPENDENT_AMBULATORY_CARE_PROVIDER_SITE_OTHER): Payer: Self-pay | Admitting: Orthopedic Surgery

## 2015-12-22 ENCOUNTER — Ambulatory Visit (INDEPENDENT_AMBULATORY_CARE_PROVIDER_SITE_OTHER): Payer: Medicare Other | Admitting: Orthopedic Surgery

## 2015-12-22 VITALS — Ht 70.0 in | Wt 180.0 lb

## 2015-12-22 DIAGNOSIS — L02512 Cutaneous abscess of left hand: Secondary | ICD-10-CM

## 2015-12-22 NOTE — Progress Notes (Signed)
Wound Care Note   Patient: [name] [name]           Date of Birth: 11-02-1924           MRN: [id number]             PCP: Anthoney Harada, MD Visit Date: 12/22/2015   Assessment & Plan: Visit Diagnoses:  1. Abscess of dorsum of hand, left     Plan: Plan to follow-up in the office in 1 week. He will soak his hand with soap and water or saline 15 minutes daily active band dressing changes daily continue with a compression wrap for the elbow continue with dry dressing to the left hand continue with oral antibiotics. Review of his cultures show normal skin flora with no staff cultured.   Follow-Up Instructions: Return in about 1 week (around 12/29/2015).  Orders:  No orders of the defined types were placed in this encounter.  No orders of the defined types were placed in this encounter.     Procedures: No notes on file   Clinical Data: No additional findings.   No images are attached to the encounter.   Subjective: Chief Complaint  Patient presents with  . Left Hand - Follow-up  . Left Elbow - Open Wound  . Lower Back - Pain  . Head - Injury  . Follow-up    abscess left dorsal hand, back pain, elbow and forehead laceration    Patient is following up for multiple different medical issues. The first being left dorsal hand abscess s/p dog bite on 12/13/15. Irrigation and debridement performed on 12/18/15. Cultures were obtained and are in patient's chart. He is currently taking doxycycline 100mg  1 po bid, which is giving him some nausea.His hand is swollen, red, warm, with drainage and maceration. He is also having quite a bit of pain in his lower back. He would like [place] compare his xrays if possible. He also has a left elbow laceration and head laceration from a fall that occurred. He is wearing Vive compression sleeve over his elbow laceration. There is minimal bloody drainage.     Review of Systems  Objective: Vital Signs: Ht 5\' 10"  (1.778 m)   Wt 180 lb (81.6  kg)   BMI 25.83 kg/m   Physical Exam: Plan to follow-up in the office in 1 week. The swelling and drainage has slowed down. Patient no longer has sausage digit swelling of the fingers. We will have him continue with the Bactroban dressing changes daily he will soak his hand in either saline or Dial soap daily for 10-15 minutes his elbow abrasion is healing quite nicely the scalp abrasion is healing quite nicely.  Specialty Comments: No specialty comments available.   PMFS History: Patient Active Problem List   Diagnosis Date Noted  . Dizziness and giddiness 06/26/2015  . Abnormality of gait 06/26/2015  . Anxiety 04/14/2015  . GERD (gastroesophageal reflux disease) 04/03/2015  . OSA (obstructive sleep apnea) 04/03/2015  . HLD (hyperlipidemia) 04/03/2015  . SOB (shortness of breath)   . Left elbow fracture 03/29/2015  . Dizziness 01/01/2015  . LBP (low back pain) 01/01/2015  . Malignant neoplasm of prostate ([place]) 01/01/2015  . BP (high blood pressure) 01/01/2015  . DOE (dyspnea on exertion) 10/26/2013  . Essential hypertension, benign 10/26/2013  . Post-traumatic wound infection 10/17/2012  . Cellulitis 10/17/2012  . Atrial fibrillation ([place]) 10/22/2011  . DVT, lower extremity, distal, chronic ([place]) 10/22/2011  . Pacemaker 10/19/2011  . Prostate cancer ([place])  10/19/2011  . COPD (chronic obstructive pulmonary disease) with emphysema ([place]) 02/04/2011  . Anemia 12/10/2010   Past Medical History:  Diagnosis Date  . Abnormality of gait 06/26/2015  . Anemia    occ. injections for this at [facility]  . Arthritis    "back"  . Chronic lower back pain   . COPD (chronic obstructive pulmonary disease) ([place]) 10/19/2011   "touch"  . Dizziness and giddiness 06/26/2015  . Exertional dyspnea 10/19/2011  . GERD (gastroesophageal reflux disease)   . Hearing loss   . History of blood transfusion    "think it was related to pacemaker placement"  . Hypertension   . Leg swelling 10/19/2011    LLE  . OSA on CPAP 10/19/2011   "sometimes wear CPAP"  . Pacemaker    Medtronic  . Prostate cancer ([place])    S/P radiation tx ~ 2008  . Skin change    12-24-14 few scalp lesions burned off"old age spots"    Family History  Problem Relation Age of Onset  . Heart disease Father   . Heart disease Mother   . Colon cancer Mother   . Stroke Mother   . Heart attack Neg Hx   . Hypertension Neg Hx    Past Surgical History:  Procedure Laterality Date  . BALLOON DILATION N/A 01/14/2015   Procedure: BALLOON DILATION;  Surgeon: Garlan Fair, MD;  Location: [facility];  Service: Endoscopy;  Laterality: N/A;  . CARDIAC CATHETERIZATION    . CARPAL TUNNEL RELEASE Left 11/14/2014   Procedure: CARPAL TUNNEL RELEASE LEFT THUMB;  Surgeon: Daryll Brod, MD;  Location: [facility];  Service: Orthopedics;  Laterality: Left;  ANESTHESIA:  IV REGIONAL FAB  . CATARACT EXTRACTION W/ INTRAOCULAR LENS  IMPLANT, BILATERAL    . CHOLECYSTECTOMY  10/23/2011  . CHOLECYSTECTOMY  10/23/2011   Procedure: CHOLECYSTECTOMY;  Surgeon: Rolm Bookbinder, MD;  Location: [facility];  Service: General;  Laterality: N/A;  with intraoperative cholangiogram  . ESOPHAGOGASTRODUODENOSCOPY (EGD) WITH PROPOFOL N/A 01/14/2015   Procedure: ESOPHAGOGASTRODUODENOSCOPY (EGD) WITH PROPOFOL;  Surgeon: Garlan Fair, MD;  [facility];  Service: Endoscopy;  Laterality: N/A;  . EYE SURGERY Bilateral    cataract  . HERNIA REPAIR  ~ 2010;    lap; VHR  . HERNIA REPAIR  ~ [id number]   lap vh complicated by recurrence  . HERNIA REPAIR  09/2011   open; VHR  . I&D EXTREMITY Left 10/20/2012   Procedure: LEFT LEG IRRIGATION AND DEBRIDEMENT, AND WOUND VAC APPLICATION;  Surgeon: Marianna Payment, MD;  [facility];  Service: Orthopedics;  Laterality: Left;  . I&D EXTREMITY Left 10/22/2012   Procedure: IRRIGATION AND DEBRIDEMENT EXTREMITY;  Surgeon: Marianna Payment, MD;  [facility];  Service: Orthopedics;  Laterality: Left;    . INSERT / REPLACE / REMOVE PACEMAKER  ~ 2009   initial placement  . JOINT REPLACEMENT    . ORIF ELBOW FRACTURE Left 03/29/2015   Procedure: LEFT ELBOW OPEN REDUCTION INTERNAL FIXATION (ORIF) DISTAL HUMERUS FRACTURE WITH OLECRANON OSTEOTOMY AND ULNAR NERVE RELEASE;  Surgeon: Roseanne Kaufman, MD;  Location: [facility];  Service: Orthopedics;  Laterality: Left;  . SKIN SPLIT GRAFT Left 10/22/2012   Procedure: SKIN GRAFT SPLIT THICKNESS;  Surgeon: Marianna Payment, MD;  [facility];  Service: Orthopedics;  Laterality: Left;  . TOTAL KNEE ARTHROPLASTY  1990's   right   Social History   Occupational History  . 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 Smoking status: Former Smoker    Packs/day: 1.50    Years: 35.00    Types: Cigarettes    Quit date: 02/15/1973  . Smokeless tobacco: Never Used  . Alcohol use No  . Drug use: No  . Sexual activity: No

## 2015-12-26 ENCOUNTER — Ambulatory Visit (INDEPENDENT_AMBULATORY_CARE_PROVIDER_SITE_OTHER): Payer: Medicare Other | Admitting: Orthopedic Surgery

## 2015-12-26 ENCOUNTER — Encounter (INDEPENDENT_AMBULATORY_CARE_PROVIDER_SITE_OTHER): Payer: Self-pay | Admitting: Orthopedic Surgery

## 2015-12-26 VITALS — Ht 70.0 in | Wt 180.0 lb

## 2015-12-26 DIAGNOSIS — T148XXA Other injury of unspecified body region, initial encounter: Secondary | ICD-10-CM

## 2015-12-26 DIAGNOSIS — L089 Local infection of the skin and subcutaneous tissue, unspecified: Secondary | ICD-10-CM

## 2015-12-26 MED ORDER — AMOXICILLIN-POT CLAVULANATE 875-125 MG PO TABS: 1.0000 | ORAL_TABLET | Freq: Two times a day (BID) | ORAL | 0 refills | Status: DC

## 2015-12-26 NOTE — Progress Notes (Signed)
Wound Care Note   Patient: [name] [name]           Date of Birth: 06-24-1924           MRN: [id number]             PCP: Anthoney Harada, MD Visit Date: 12/26/2015   Assessment & Plan: Visit Diagnoses:  1. Post-traumatic wound infection     Plan: Follow-up in the office on Monday if he is still symptomatic. We've changed his antibiotic to Augmentin. Patient was given instructions to go to the emergency room if symptoms worsen. Discussed that we may need to proceed with IV antibiotics.  Follow-Up Instructions: Return in about 1 week (around 01/02/2016).  Orders:  No orders of the defined types were placed in this encounter.  Meds ordered this encounter  Medications  . amoxicillin-clavulanate (AUGMENTIN) 875-125 MG tablet    Sig: Take 1 tablet by mouth 2 (two) times daily.    Dispense:  30 tablet    Refill:  0      Procedures: No notes on file   Clinical Data: No additional findings.   No images are attached to the encounter.   Subjective: Chief Complaint  Patient presents with  . Left Hand - Pain, Injury    Left hand s/p dog bite and incision and drainage in the office. Pt states that " it doesn't look good. Its a giant whole" the hand is swollen and a little pink. He does state that it is tender and is on Doxy mg bid. He is soaking and washing wound daily and covers with neosporin and and ace bandage for compression.    Injury     Review of Systems  Miscellaneous:  -Home Health Care: N/A  -Physical Therapy: N/A  -Out of Work?: N/A  -Worker's Compensation Case?: N/A  -Additional Information: N/A   Objective: Vital Signs: Ht 5\' 10"  (1.778 m)   Wt 180 lb (81.6 kg)   BMI 25.83 kg/m   Physical Exam: Examination patient has some fibrous exudate in the wound which is 2 x 5 mm. There is clear fluid draining. There is no pain to palpation along the little finger laterally to the MCP joint where there appears to been some traumatic bites but this is  asymptomatic he is more tender dorsally over the metacarpal heads 5 and 4 directly adjacent to the open wound. There is no fluctuance no abscess there is no flexor tenosynovium right is no pain with active or passive range of motion of his fingers. There is no ascending cellulitis.  Specialty Comments: No specialty comments available.   PMFS History: Patient Active Problem List   Diagnosis Date Noted  . Dizziness and giddiness 06/26/2015  . Abnormality of gait 06/26/2015  . Anxiety 04/14/2015  . GERD (gastroesophageal reflux disease) 04/03/2015  . OSA (obstructive sleep apnea) 04/03/2015  . HLD (hyperlipidemia) 04/03/2015  . SOB (shortness of breath)   . Left elbow fracture 03/29/2015  . Dizziness 01/01/2015  . LBP (low back pain) 01/01/2015  . Malignant neoplasm of prostate ([place]) 01/01/2015  . BP (high blood pressure) 01/01/2015  . DOE (dyspnea on exertion) 10/26/2013  . Essential hypertension, benign 10/26/2013  . Post-traumatic wound infection 10/17/2012  . Cellulitis 10/17/2012  . Atrial fibrillation ([place]) 10/22/2011  . DVT, lower extremity, distal, chronic ([place]) 10/22/2011  . Pacemaker 10/19/2011  . Prostate cancer ([place]) 10/19/2011  . COPD (chronic obstructive pulmonary disease) with emphysema ([place]) 02/04/2011  . Anemia 12/10/2010  Past Medical History:  Diagnosis Date  . Abnormality of gait 06/26/2015  . Anemia    occ. injections for this at [facility]  . Arthritis    "back"  . Chronic lower back pain   . COPD (chronic obstructive pulmonary disease) ([place]) 10/19/2011   "touch"  . Dizziness and giddiness 06/26/2015  . Exertional dyspnea 10/19/2011  . GERD (gastroesophageal reflux disease)   . Hearing loss   . History of blood transfusion    "think it was related to pacemaker placement"  . Hypertension   . Leg swelling 10/19/2011   LLE  . OSA on CPAP 10/19/2011   "sometimes wear CPAP"  . Pacemaker    Medtronic  . Prostate cancer ([place])    S/P radiation tx ~ 2008    . Skin change    12-24-14 few scalp lesions burned off"old age spots"    Family History  Problem Relation Age of Onset  . Heart disease Father   . Heart disease Mother   . Colon cancer Mother   . Stroke Mother   . Heart attack Neg Hx   . Hypertension Neg Hx    Past Surgical History:  Procedure Laterality Date  . BALLOON DILATION N/A 01/14/2015   Procedure: BALLOON DILATION;  Surgeon: Garlan Fair, MD;  Location: [facility];  Service: Endoscopy;  Laterality: N/A;  . CARDIAC CATHETERIZATION    . CARPAL TUNNEL RELEASE Left 11/14/2014   Procedure: CARPAL TUNNEL RELEASE LEFT THUMB;  Surgeon: Daryll Brod, MD;  Location: [facility];  Service: Orthopedics;  Laterality: Left;  ANESTHESIA:  IV REGIONAL FAB  . CATARACT EXTRACTION W/ INTRAOCULAR LENS  IMPLANT, BILATERAL    . CHOLECYSTECTOMY  10/23/2011  . CHOLECYSTECTOMY  10/23/2011   Procedure: CHOLECYSTECTOMY;  Surgeon: Rolm Bookbinder, MD;  Location: [facility];  Service: General;  Laterality: N/A;  with intraoperative cholangiogram  . ESOPHAGOGASTRODUODENOSCOPY (EGD) WITH PROPOFOL N/A 01/14/2015   Procedure: ESOPHAGOGASTRODUODENOSCOPY (EGD) WITH PROPOFOL;  Surgeon: Garlan Fair, MD;  [facility];  Service: Endoscopy;  Laterality: N/A;  . EYE SURGERY Bilateral    cataract  . HERNIA REPAIR  ~ 2010;    lap; VHR  . HERNIA REPAIR  ~ [id number]   lap vh complicated by recurrence  . HERNIA REPAIR  09/2011   open; VHR  . I&D EXTREMITY Left 10/20/2012   Procedure: LEFT LEG IRRIGATION AND DEBRIDEMENT, AND WOUND VAC APPLICATION;  Surgeon: Marianna Payment, MD;  [facility];  Service: Orthopedics;  Laterality: Left;  . I&D EXTREMITY Left 10/22/2012   Procedure: IRRIGATION AND DEBRIDEMENT EXTREMITY;  Surgeon: Marianna Payment, MD;  [facility];  Service: Orthopedics;  Laterality: Left;  . INSERT / REPLACE / REMOVE PACEMAKER  ~ 2009   initial placement  . JOINT REPLACEMENT    . ORIF ELBOW FRACTURE Left 03/29/2015    Procedure: LEFT ELBOW OPEN REDUCTION INTERNAL FIXATION (ORIF) DISTAL HUMERUS FRACTURE WITH OLECRANON OSTEOTOMY AND ULNAR NERVE RELEASE;  Surgeon: Roseanne Kaufman, MD;  Location: [facility];  Service: Orthopedics;  Laterality: Left;  . SKIN SPLIT GRAFT Left 10/22/2012   Procedure: SKIN GRAFT SPLIT THICKNESS;  Surgeon: Marianna Payment, MD;  [facility];  Service: Orthopedics;  Laterality: Left;  . TOTAL KNEE ARTHROPLASTY  1990's   right   Social History   Occupational History  . 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 Smoking status: Former Smoker  Packs/day: 1.50    Years: 35.00    Types: Cigarettes    Quit date: 02/15/1973  . Smokeless tobacco: Never Used  . Alcohol use No  . Drug use: No  . Sexual activity: No

## 2015-12-29 ENCOUNTER — Ambulatory Visit (INDEPENDENT_AMBULATORY_CARE_PROVIDER_SITE_OTHER): Payer: Medicare Other | Admitting: Orthopedic Surgery

## 2015-12-30 ENCOUNTER — Ambulatory Visit (INDEPENDENT_AMBULATORY_CARE_PROVIDER_SITE_OTHER): Payer: Medicare Other | Admitting: Orthopedic Surgery

## 2015-12-30 ENCOUNTER — Encounter (INDEPENDENT_AMBULATORY_CARE_PROVIDER_SITE_OTHER): Payer: Self-pay | Admitting: Orthopedic Surgery

## 2015-12-30 DIAGNOSIS — S61452S Open bite of left hand, sequela: Secondary | ICD-10-CM

## 2015-12-30 DIAGNOSIS — W540XXS Bitten by dog, sequela: Secondary | ICD-10-CM | POA: Diagnosis not present

## 2015-12-30 NOTE — Progress Notes (Signed)
Wound Care Note   Patient: [name] [name]           Date of Birth: [birth year]           MRN: [id number]             PCP: Anthoney Harada, MD Visit Date: 12/30/2015   Assessment & Plan: Visit Diagnoses:  1. Dog bite, hand, left, sequela     Plan: Follow-up as needed. Patient will complete his course of Augmentin. This has seemed to make a significant difference in his cellulitis and swelling. He will continue the Bactroban dressing changes the wound is completely healed now has good granulation tissue measures 3 x 5 mm there is no drainage   Follow-Up Instructions: Return if symptoms worsen or fail to improve.  Orders:  No orders of the defined types were placed in this encounter.  No orders of the defined types were placed in this encounter.     Procedures: No notes on file   Clinical Data: No additional findings.   No images are attached to the encounter.   Subjective: Chief Complaint  Patient presents with  . Left Hand - Wound Check    S/p dog bite    Patient is status dog bite 12/13/15. He sustained laceration dorsal left hand. He is having incredible amount of pain left hand, especially little finger. He has skin discoloration, stiffness and tightness with significant swelling. He denies nausea or fever. He applies antibiotic ointment dressing changes daily. He is using percocet for his pain.   Wound Check  He was originally treated more than 14 days ago. Previous treatment included oral antibiotics. There has been bloody discharge from the wound. There is no redness present. The swelling has worsened. The pain has worsened. There is difficulty moving the extremity or digit due to pain.  Patient states the swelling has increased though clinical examination the swelling is significantly decreased from his examination last week. Patient states that the pain is still present but not as bad as it was.  Review of Systems   Objective: Physical Exam: Patient  is alert oriented no adenopathy well-dressed normal affect normal respiratory effort.  Patient is difficulty from getting from a sitting to a standing position valgus alignment to both knees worse on the right.   Examination of his left hand he has full range of motion of his digits no signs of any flexor or extensor tenosynovitis. The swelling in the ring finger has essentially completely resolved. The redness and swelling of the little fingers also resolved. There is no drainage no cellulitis there is no signs of residual infection though patient still does have some pain. The surgical drainage site has excellent granulation tissue at measures 3 x 5 mm. There is no drainage.  Specialty Comments: No specialty comments available.   PMFS History: Patient Active Problem List   Diagnosis Date Noted  . Dizziness and giddiness 06/26/2015  . Abnormality of gait 06/26/2015  . Anxiety 04/14/2015  . GERD (gastroesophageal reflux disease) 04/03/2015  . OSA (obstructive sleep apnea) 04/03/2015  . HLD (hyperlipidemia) 04/03/2015  . SOB (shortness of breath)   . Left elbow fracture 03/29/2015  . Dizziness 01/01/2015  . LBP (low back pain) 01/01/2015  . Malignant neoplasm of prostate ([place]) 01/01/2015  . BP (high blood pressure) 01/01/2015  . DOE (dyspnea on exertion) 10/26/2013  . Essential hypertension, benign 10/26/2013  . Post-traumatic wound infection 10/17/2012  . Cellulitis 10/17/2012  . Atrial fibrillation ([place]) 10/22/2011  .  DVT, lower extremity, distal, chronic ([place]) 10/22/2011  . Pacemaker 10/19/2011  . Prostate cancer ([place]) 10/19/2011  . COPD (chronic obstructive pulmonary disease) with emphysema ([place]) 02/04/2011  . Anemia 12/10/2010   Past Medical History:  Diagnosis Date  . Abnormality of gait 06/26/2015  . Anemia    occ. injections for this at [facility]  . Arthritis    "back"  . Chronic lower back pain   . COPD (chronic obstructive pulmonary disease) ([place]) 10/19/2011    "touch"  . Dizziness and giddiness 06/26/2015  . Exertional dyspnea 10/19/2011  . GERD (gastroesophageal reflux disease)   . Hearing loss   . History of blood transfusion    "think it was related to pacemaker placement"  . Hypertension   . Leg swelling 10/19/2011   LLE  . OSA on CPAP 10/19/2011   "sometimes wear CPAP"  . Pacemaker    Medtronic  . Prostate cancer ([place])    S/P radiation tx ~ 2008  . Skin change    12-24-14 few scalp lesions burned off"old age spots"    Family History  Problem Relation Age of Onset  . Heart disease Father   . Heart disease Mother   . Colon cancer Mother   . Stroke Mother   . Heart attack Neg Hx   . Hypertension Neg Hx    Past Surgical History:  Procedure Laterality Date  . BALLOON DILATION N/A 01/14/2015   Procedure: BALLOON DILATION;  Surgeon: Garlan Fair, MD;  Location: [facility];  Service: Endoscopy;  Laterality: N/A;  . CARDIAC CATHETERIZATION    . CARPAL TUNNEL RELEASE Left 11/14/2014   Procedure: CARPAL TUNNEL RELEASE LEFT THUMB;  Surgeon: Daryll Brod, MD;  Location: [facility];  Service: Orthopedics;  Laterality: Left;  ANESTHESIA:  IV REGIONAL FAB  . CATARACT EXTRACTION W/ INTRAOCULAR LENS  IMPLANT, BILATERAL    . CHOLECYSTECTOMY  10/23/2011  . CHOLECYSTECTOMY  10/23/2011   Procedure: CHOLECYSTECTOMY;  Surgeon: Rolm Bookbinder, MD;  Location: [facility];  Service: General;  Laterality: N/A;  with intraoperative cholangiogram  . ESOPHAGOGASTRODUODENOSCOPY (EGD) WITH PROPOFOL N/A 01/14/2015   Procedure: ESOPHAGOGASTRODUODENOSCOPY (EGD) WITH PROPOFOL;  Surgeon: Garlan Fair, MD;  [facility];  Service: Endoscopy;  Laterality: N/A;  . EYE SURGERY Bilateral    cataract  . HERNIA REPAIR  ~ 2010;    lap; VHR  . HERNIA REPAIR  ~ [id number]   lap vh complicated by recurrence  . HERNIA REPAIR  09/2011   open; VHR  . I&D EXTREMITY Left 10/20/2012   Procedure: LEFT LEG IRRIGATION AND DEBRIDEMENT, AND WOUND VAC APPLICATION;   Surgeon: Marianna Payment, MD;  [facility];  Service: Orthopedics;  Laterality: Left;  . I&D EXTREMITY Left 10/22/2012   Procedure: IRRIGATION AND DEBRIDEMENT EXTREMITY;  Surgeon: Marianna Payment, MD;  [facility];  Service: Orthopedics;  Laterality: Left;  . INSERT / REPLACE / REMOVE PACEMAKER  ~ 2009   initial placement  . JOINT REPLACEMENT    . ORIF ELBOW FRACTURE Left 03/29/2015   Procedure: LEFT ELBOW OPEN REDUCTION INTERNAL FIXATION (ORIF) DISTAL HUMERUS FRACTURE WITH OLECRANON OSTEOTOMY AND ULNAR NERVE RELEASE;  Surgeon: Roseanne Kaufman, MD;  Location: [facility];  Service: Orthopedics;  Laterality: Left;  . SKIN SPLIT GRAFT Left 10/22/2012   Procedure: SKIN GRAFT SPLIT THICKNESS;  Surgeon: Marianna Payment, MD;  [facility];  Service: Orthopedics;  Laterality: Left;  . TOTAL KNEE ARTHROPLASTY  1990's   right  Social History   Occupational History  . 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 Smoking status: Former Smoker    Packs/day: 1.50    Years: 35.00    Types: Cigarettes    Quit date: 02/15/1973  . Smokeless tobacco: Never Used  . Alcohol use No  . Drug use: No  . Sexual activity: No

## 2016-01-02 ENCOUNTER — Ambulatory Visit (INDEPENDENT_AMBULATORY_CARE_PROVIDER_SITE_OTHER): Payer: Medicare Other | Admitting: Orthopedic Surgery

## 2016-01-19 DIAGNOSIS — L821 Other seborrheic keratosis: Secondary | ICD-10-CM | POA: Diagnosis not present

## 2016-01-20 DIAGNOSIS — N401 Enlarged prostate with lower urinary tract symptoms: Secondary | ICD-10-CM | POA: Diagnosis not present

## 2016-02-02 DIAGNOSIS — Z79899 Other long term (current) drug therapy: Secondary | ICD-10-CM | POA: Diagnosis not present

## 2016-02-02 DIAGNOSIS — I10 Essential (primary) hypertension: Secondary | ICD-10-CM | POA: Diagnosis not present

## 2016-02-02 DIAGNOSIS — R42 Dizziness and giddiness: Secondary | ICD-10-CM | POA: Diagnosis not present

## 2016-02-02 DIAGNOSIS — D649 Anemia, unspecified: Secondary | ICD-10-CM | POA: Diagnosis not present

## 2016-02-02 DIAGNOSIS — G4700 Insomnia, unspecified: Secondary | ICD-10-CM | POA: Diagnosis not present

## 2016-02-02 DIAGNOSIS — R682 Dry mouth, unspecified: Secondary | ICD-10-CM | POA: Diagnosis not present

## 2016-02-02 DIAGNOSIS — K5900 Constipation, unspecified: Secondary | ICD-10-CM | POA: Diagnosis not present

## 2016-02-02 DIAGNOSIS — G5622 Lesion of ulnar nerve, left upper limb: Secondary | ICD-10-CM | POA: Diagnosis not present

## 2016-02-02 DIAGNOSIS — D692 Other nonthrombocytopenic purpura: Secondary | ICD-10-CM | POA: Diagnosis not present

## 2016-02-02 DIAGNOSIS — R600 Localized edema: Secondary | ICD-10-CM | POA: Diagnosis not present

## 2016-02-02 DIAGNOSIS — R2689 Other abnormalities of gait and mobility: Secondary | ICD-10-CM | POA: Diagnosis not present

## 2016-02-03 DIAGNOSIS — Z8546 Personal history of malignant neoplasm of prostate: Secondary | ICD-10-CM | POA: Diagnosis not present

## 2016-02-10 ENCOUNTER — Other Ambulatory Visit: Payer: Self-pay | Admitting: Internal Medicine

## 2016-02-10 MED ORDER — LISINOPRIL 20 MG PO TABS: 10.0000 mg | ORAL_TABLET | Freq: Every day | ORAL | 3 refills | Status: DC

## 2016-02-10 NOTE — Telephone Encounter (Signed)
°*  STAT* If patient is at the pharmacy, call can be transferred to refill team.   1. Which medications need to be refilled? (please list name of each medication and dose if known) new prescription for 10 mg of Lisinopril  2. Which pharmacy/location (including street and city if local pharmacy) is medication to be sent to?[facility] [phone]  3. Do they need a 30 day or 90 day supply? 90 and refills

## 2016-02-11 ENCOUNTER — Telehealth: Payer: Self-pay | Admitting: Neurology

## 2016-02-11 ENCOUNTER — Ambulatory Visit (INDEPENDENT_AMBULATORY_CARE_PROVIDER_SITE_OTHER): Payer: Medicare Other | Admitting: *Deleted

## 2016-02-11 DIAGNOSIS — I495 Sick sinus syndrome: Secondary | ICD-10-CM

## 2016-02-11 NOTE — Telephone Encounter (Signed)
Pt request to be seen sooner than 05/25/16. Pt is aware Dr Viona Gilmore and Anderson Malta are out of the office until tomorrow, this can wait until then.

## 2016-02-11 NOTE — Telephone Encounter (Signed)
Spoke to pt and sooner appt scheduled for Wed 1/10, 11:45 arrival time.

## 2016-02-12 NOTE — Progress Notes (Signed)
Remote pacemaker transmission.   

## 2016-02-13 ENCOUNTER — Encounter: Payer: Self-pay | Admitting: Cardiology

## 2016-02-13 LAB — CUP PACEART REMOTE DEVICE CHECK
Battery Impedance: 1130 Ohm
Battery Voltage: 2.75 V
Brady Statistic AP VS Percent: 70 %
Date Time Interrogation Session: 20171227163254
Implantable Lead Location: 753859
Implantable Lead Model: 5076
Implantable Lead Model: 5076
Lead Channel Pacing Threshold Pulse Width: 0.4 ms
Lead Channel Setting Pacing Amplitude: 2.5 V
Lead Channel Setting Pacing Pulse Width: 0.4 ms
MDC IDC LEAD IMPLANT DT: 20080826
MDC IDC LEAD IMPLANT DT: 20080826
MDC IDC LEAD LOCATION: 753860
MDC IDC MSMT BATTERY REMAINING LONGEVITY: 42 mo
MDC IDC MSMT LEADCHNL RA IMPEDANCE VALUE: 416 Ohm
MDC IDC MSMT LEADCHNL RA PACING THRESHOLD AMPLITUDE: 0.75 V
MDC IDC MSMT LEADCHNL RV IMPEDANCE VALUE: 498 Ohm
MDC IDC MSMT LEADCHNL RV PACING THRESHOLD AMPLITUDE: 1.125 V
MDC IDC MSMT LEADCHNL RV PACING THRESHOLD PULSEWIDTH: 0.4 ms
MDC IDC PG IMPLANT DT: 20080826
MDC IDC SET LEADCHNL RA PACING AMPLITUDE: 1.5 V
MDC IDC SET LEADCHNL RV SENSING SENSITIVITY: 2 mV
MDC IDC STAT BRADY AP VP PERCENT: 7 %
MDC IDC STAT BRADY AS VP PERCENT: 1 %
MDC IDC STAT BRADY AS VS PERCENT: 21 %

## 2016-02-19 ENCOUNTER — Ambulatory Visit (INDEPENDENT_AMBULATORY_CARE_PROVIDER_SITE_OTHER): Payer: Medicare Other

## 2016-02-19 ENCOUNTER — Encounter (INDEPENDENT_AMBULATORY_CARE_PROVIDER_SITE_OTHER): Payer: Self-pay | Admitting: Orthopedic Surgery

## 2016-02-19 ENCOUNTER — Ambulatory Visit (INDEPENDENT_AMBULATORY_CARE_PROVIDER_SITE_OTHER): Payer: Medicare Other | Admitting: Family

## 2016-02-19 VITALS — Ht 70.0 in | Wt 180.0 lb

## 2016-02-19 DIAGNOSIS — M7711 Lateral epicondylitis, right elbow: Secondary | ICD-10-CM

## 2016-02-19 DIAGNOSIS — M7541 Impingement syndrome of right shoulder: Secondary | ICD-10-CM | POA: Diagnosis not present

## 2016-02-19 DIAGNOSIS — M25521 Pain in right elbow: Secondary | ICD-10-CM

## 2016-02-19 DIAGNOSIS — M7542 Impingement syndrome of left shoulder: Secondary | ICD-10-CM | POA: Diagnosis not present

## 2016-02-19 NOTE — Progress Notes (Signed)
Office Visit Note   Patient: [name] [name]           Date of Birth: Jun 07, 1924           MRN: [id number] Visit Date: 02/19/2016              Requested by: [name], MD [address] [place], [place] [address] PCP: Anthoney Harada, MD  Chief Complaint  Patient presents with  . Right Shoulder - Pain  . Left Shoulder - Pain  . Right Elbow - Pain    HPI: Pt complains of bilateral shoulder pain and is requesting repeat injections for both. He also is complaining of right elbow pain there has been no injury. The pt states that it has been painful for "sometime" and has decreased strength in this arm complains of difficulty lifting things. Pamella Pert, RMA  Patient is a 83 year old gentleman who is seen today for evaluation of bilateral shoulder pain. Has been getting serial steroid injection bilaterally. These have been providing great interval relief of pain. Is now complaining of him pain in the right forearm. No known injury. Aching pain. Complains of difficulty with lifting things with the right upper extremity.  Assessment & Plan: Visit Diagnoses:  1. Pain in right elbow   2. Impingement syndrome of left shoulder   3. Impingement syndrome of right shoulder   4. Lateral epicondylitis, right elbow     Plan: Bilateral shoulder injections today. Have provided him with a counterforce brace for his right elbow.  Follow-Up Instructions: Return in about 6 weeks (around 04/01/2016), or if symptoms worsen or fail to improve.   Exam: Patient is alert and oriented. No adenopathy. Well-dressed. Normal affect. Respirations easy.   Right Elbow Exam   Tenderness  The patient is experiencing tenderness in the lateral epicondyle.   Range of Motion  The patient has normal right elbow ROM.  Muscle Strength  The patient has normal right elbow strength.  Other  Sensation: normal Pulse: present   Right Shoulder Exam   Tenderness  The patient is experiencing  tenderness in the biceps tendon.  Range of Motion  Active Abduction: 90  Passive Abduction: normal   Muscle Strength  The patient has normal right shoulder strength.  Tests  Impingement: positive   Left Shoulder Exam   Tenderness  The patient is experiencing tenderness in the biceps tendon.  Range of Motion  Active Abduction: 90  Passive Abduction: normal   Muscle Strength  The patient has normal left shoulder strength.    Grip strength equal.  Imaging: No results found.  Orders:  Orders Placed This Encounter  Procedures  . Large Joint Injection/Arthrocentesis  . Large Joint Injection/Arthrocentesis  . XR Elbow 2 Views Right   No orders of the defined types were placed in this encounter.    Procedures: Large Joint Inj Date/Time: 02/23/2016 9:18 AM Performed by: Suzan Slick Authorized by: Dondra Prader R   Consent Given by:  Patient Site marked: the procedure site was marked   Timeout: prior to procedure the correct patient, procedure, and site was verified   Indications:  Pain and diagnostic evaluation Location:  Shoulder Site:  R subacromial bursa Prep: patient was prepped and draped in usual sterile fashion   Needle Size:  22 G Needle Length:  1.5 inches Ultrasound Guidance: No   Fluoroscopic Guidance: No   Arthrogram: No   Medications:  5 mL lidocaine 1 %; 40 mg methylPREDNISolone acetate 40 MG/ML Aspiration  Attempted: No   Patient tolerance:  Patient tolerated the procedure well with no immediate complications Large Joint Inj Date/Time: 02/23/2016 9:19 AM Performed by: Suzan Slick Authorized by: Dondra Prader R   Consent Given by:  Patient Site marked: the procedure site was marked   Timeout: prior to procedure the correct patient, procedure, and site was verified   Indications:  Pain and diagnostic evaluation Location:  Shoulder Site:  L subacromial bursa Prep: patient was prepped and draped in usual sterile fashion   Needle Size:  22  G Needle Length:  1.5 inches Ultrasound Guidance: No   Fluoroscopic Guidance: No   Arthrogram: No   Medications:  5 mL lidocaine 1 %; 40 mg methylPREDNISolone acetate 40 MG/ML Aspiration Attempted: No   Patient tolerance:  Patient tolerated the procedure well with no immediate complications    Clinical Data: No additional findings.  Subjective: Review of Systems  Constitutional: Negative for chills and fever.  Musculoskeletal: Positive for arthralgias.    Objective: Vital Signs: Ht 5\' 10"  (1.778 m)   Wt 180 lb (81.6 kg)   BMI 25.83 kg/m   Specialty Comments:  No specialty comments available.  PMFS History: Patient Active Problem List   Diagnosis Date Noted  . Dizziness and giddiness 06/26/2015  . Abnormality of gait 06/26/2015  . Anxiety 04/14/2015  . GERD (gastroesophageal reflux disease) 04/03/2015  . OSA (obstructive sleep apnea) 04/03/2015  . HLD (hyperlipidemia) 04/03/2015  . SOB (shortness of breath)   . Left elbow fracture 03/29/2015  . Dizziness 01/01/2015  . LBP (low back pain) 01/01/2015  . Malignant neoplasm of prostate ([place]) 01/01/2015  . BP (high blood pressure) 01/01/2015  . DOE (dyspnea on exertion) 10/26/2013  . Essential hypertension, benign 10/26/2013  . Post-traumatic wound infection 10/17/2012  . Cellulitis 10/17/2012  . Atrial fibrillation ([place]) 10/22/2011  . DVT, lower extremity, distal, chronic ([place]) 10/22/2011  . Pacemaker 10/19/2011  . Prostate cancer ([place]) 10/19/2011  . COPD (chronic obstructive pulmonary disease) with emphysema ([place]) 02/04/2011  . Anemia 12/10/2010   Past Medical History:  Diagnosis Date  . Abnormality of gait 06/26/2015  . Anemia    occ. injections for this at [facility]  . Arthritis    "back"  . Chronic lower back pain   . COPD (chronic obstructive pulmonary disease) ([place]) 10/19/2011   "touch"  . Dizziness and giddiness 06/26/2015  . Exertional dyspnea 10/19/2011  . GERD (gastroesophageal reflux disease)    . Hearing loss   . History of blood transfusion    "think it was related to pacemaker placement"  . Hypertension   . Leg swelling 10/19/2011   LLE  . OSA on CPAP 10/19/2011   "sometimes wear CPAP"  . Pacemaker    Medtronic  . Prostate cancer ([place])    S/P radiation tx ~ 2008  . Skin change    12-24-14 few scalp lesions burned off"old age spots"    Family History  Problem Relation Age of Onset  . Heart disease Father   . Heart disease Mother   . Colon cancer Mother   . Stroke Mother   . Heart attack Neg Hx   . Hypertension Neg Hx     Past Surgical History:  Procedure Laterality Date  . BALLOON DILATION N/A 01/14/2015   Procedure: BALLOON DILATION;  Surgeon: Garlan Fair, MD;  Location: [facility];  Service: Endoscopy;  Laterality: N/A;  . CARDIAC CATHETERIZATION    . CARPAL TUNNEL RELEASE Left 11/14/2014  Procedure: CARPAL TUNNEL RELEASE LEFT THUMB;  Surgeon: Daryll Brod, MD;  Location: [facility];  Service: Orthopedics;  Laterality: Left;  ANESTHESIA:  IV REGIONAL FAB  . CATARACT EXTRACTION W/ INTRAOCULAR LENS  IMPLANT, BILATERAL    . CHOLECYSTECTOMY  10/23/2011  . CHOLECYSTECTOMY  10/23/2011   Procedure: CHOLECYSTECTOMY;  Surgeon: Rolm Bookbinder, MD;  Location: [facility];  Service: General;  Laterality: N/A;  with intraoperative cholangiogram  . ESOPHAGOGASTRODUODENOSCOPY (EGD) WITH PROPOFOL N/A 01/14/2015   Procedure: ESOPHAGOGASTRODUODENOSCOPY (EGD) WITH PROPOFOL;  Surgeon: Garlan Fair, MD;  [facility];  Service: Endoscopy;  Laterality: N/A;  . EYE SURGERY Bilateral    cataract  . HERNIA REPAIR  ~ 2010;    lap; VHR  . HERNIA REPAIR  ~ [id number]   lap vh complicated by recurrence  . HERNIA REPAIR  09/2011   open; VHR  . I&D EXTREMITY Left 10/20/2012   Procedure: LEFT LEG IRRIGATION AND DEBRIDEMENT, AND WOUND VAC APPLICATION;  Surgeon: Marianna Payment, MD;  [facility];  Service: Orthopedics;  Laterality: Left;  . I&D EXTREMITY Left 10/22/2012    Procedure: IRRIGATION AND DEBRIDEMENT EXTREMITY;  Surgeon: Marianna Payment, MD;  [facility];  Service: Orthopedics;  Laterality: Left;  . INSERT / REPLACE / REMOVE PACEMAKER  ~ 2009   initial placement  . JOINT REPLACEMENT    . ORIF ELBOW FRACTURE Left 03/29/2015   Procedure: LEFT ELBOW OPEN REDUCTION INTERNAL FIXATION (ORIF) DISTAL HUMERUS FRACTURE WITH OLECRANON OSTEOTOMY AND ULNAR NERVE RELEASE;  Surgeon: Roseanne Kaufman, MD;  Location: [facility];  Service: Orthopedics;  Laterality: Left;  . SKIN SPLIT GRAFT Left 10/22/2012   Procedure: SKIN GRAFT SPLIT THICKNESS;  Surgeon: Marianna Payment, MD;  [facility];  Service: Orthopedics;  Laterality: Left;  . TOTAL KNEE ARTHROPLASTY  1990's   right   Social History   Occupational History  . 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 Smoking status: Former Smoker    Packs/day: 1.50    Years: 35.00    Types: Cigarettes    Quit date: 02/15/1973  . Smokeless tobacco: Never Used  . Alcohol use No  . Drug use: No  . Sexual activity: No

## 2016-02-20 ENCOUNTER — Encounter: Payer: Self-pay | Admitting: Podiatry

## 2016-02-20 ENCOUNTER — Ambulatory Visit (INDEPENDENT_AMBULATORY_CARE_PROVIDER_SITE_OTHER): Payer: Medicare Other | Admitting: Podiatry

## 2016-02-20 DIAGNOSIS — M779 Enthesopathy, unspecified: Secondary | ICD-10-CM | POA: Diagnosis not present

## 2016-02-20 MED ORDER — TRIAMCINOLONE ACETONIDE 10 MG/ML IJ SUSP
10.0000 mg | Freq: Once | INTRAMUSCULAR | Status: AC
  Administered 2016-02-20: 10 mg

## 2016-02-23 DIAGNOSIS — M7541 Impingement syndrome of right shoulder: Secondary | ICD-10-CM

## 2016-02-23 DIAGNOSIS — M7711 Lateral epicondylitis, right elbow: Secondary | ICD-10-CM | POA: Diagnosis not present

## 2016-02-23 DIAGNOSIS — M7542 Impingement syndrome of left shoulder: Secondary | ICD-10-CM | POA: Diagnosis not present

## 2016-02-23 DIAGNOSIS — M25521 Pain in right elbow: Secondary | ICD-10-CM | POA: Diagnosis not present

## 2016-02-23 MED ORDER — METHYLPREDNISOLONE ACETATE 40 MG/ML IJ SUSP
40.0000 mg | INTRAMUSCULAR | Status: AC | PRN
  Administered 2016-02-23: 40 mg via INTRA_ARTICULAR

## 2016-02-23 MED ORDER — LIDOCAINE HCL 1 % IJ SOLN
5.0000 mL | INTRAMUSCULAR | Status: AC | PRN
  Administered 2016-02-23: 5 mL

## 2016-02-23 NOTE — Progress Notes (Signed)
Subjective:     Patient ID: [name] [name], male   DOB: [birth year], 83 y.o.   MRN: [id number]  HPI patient states that his ankles of started to hurt him again and he did okay with medication but they become symptomatic   Review of Systems     Objective:   Physical Exam Neurovascular status intact muscle strength unchanged with patient found to have inflammation sinus tarsi bilateral    Assessment:     Subtalar joint capsulitis bilateral    Plan:     Injection of subtalar joint bilateral 3 mg Kenalog 5 mill grams Xylocaine to reduce inflammation and reappoint as needed

## 2016-02-24 DIAGNOSIS — H6123 Impacted cerumen, bilateral: Secondary | ICD-10-CM | POA: Diagnosis not present

## 2016-02-25 ENCOUNTER — Ambulatory Visit (INDEPENDENT_AMBULATORY_CARE_PROVIDER_SITE_OTHER): Payer: Medicare Other | Admitting: Neurology

## 2016-02-25 ENCOUNTER — Encounter: Payer: Self-pay | Admitting: Neurology

## 2016-02-25 VITALS — BP 121/64 | HR 77 | Ht 70.0 in | Wt 178.0 lb

## 2016-02-25 DIAGNOSIS — R42 Dizziness and giddiness: Secondary | ICD-10-CM

## 2016-02-25 DIAGNOSIS — R269 Unspecified abnormalities of gait and mobility: Secondary | ICD-10-CM

## 2016-02-25 MED ORDER — GABAPENTIN 100 MG PO CAPS: ORAL_CAPSULE | ORAL | 3 refills | Status: DC

## 2016-02-25 NOTE — Progress Notes (Signed)
Reason for visit: Dizziness  [name] [name] is an 83 y.o. male  History of present illness:  [name] [name] is an 83 year old right-handed white male with a history of chronic dizziness. The patient has had a CT scan of the brain in the past that was unremarkable. He has lumbosacral spinal stenosis and chronic low back pain, he drags his right leg with walking. He has evidence of a peripheral neuropathy that was diagnosed on nerve conduction study in 2012 with a moderate severity neuropathy at that time. He has gait instability associated with this, he uses a quad cane for ambulation. He has not had any recent falls. The patient reports that he feels somewhat dizzy all day long, better in the morning and worse as the day goes on. The dizziness parallels a pressure sensation in the head. The patient denies any true vertigo. He also reports some stinging sensations in the toes and in the fifth finger on the right hand. He is worse in the evening hours. The dizziness is present with sitting or standing. Surprisingly, when he gets into his car, the dizziness goes away completely.  Past Medical History:  Diagnosis Date  . Abnormality of gait 06/26/2015  . Anemia    occ. injections for this at [facility]  . Arthritis    "back"  . Chronic lower back pain   . COPD (chronic obstructive pulmonary disease) ([place]) 10/19/2011   "touch"  . Dizziness and giddiness 06/26/2015  . Exertional dyspnea 10/19/2011  . Fall   . GERD (gastroesophageal reflux disease)   . Hearing loss   . History of blood transfusion    "think it was related to pacemaker placement"  . Hypertension   . Leg swelling 10/19/2011   LLE  . OSA on CPAP 10/19/2011   "sometimes wear CPAP"  . Pacemaker    [employer]  . Prostate cancer ([place])    S/P radiation tx ~ 2008  . Skin change    12-24-14 few scalp lesions burned off"old age spots"    Past Surgical History:  Procedure Laterality Date  . BALLOON DILATION N/A 01/14/2015   Procedure: BALLOON DILATION;  Surgeon: Garlan Fair, MD;  Location: [facility];  Service: Endoscopy;  Laterality: N/A;  . CARDIAC CATHETERIZATION    . CARPAL TUNNEL RELEASE Left 11/14/2014   Procedure: CARPAL TUNNEL RELEASE LEFT THUMB;  Surgeon: Daryll Brod, MD;  Location: [facility];  Service: Orthopedics;  Laterality: Left;  ANESTHESIA:  IV REGIONAL FAB  . CATARACT EXTRACTION W/ INTRAOCULAR LENS  IMPLANT, BILATERAL    . CHOLECYSTECTOMY  10/23/2011  . CHOLECYSTECTOMY  10/23/2011   Procedure: CHOLECYSTECTOMY;  Surgeon: Rolm Bookbinder, MD;  Location: [facility];  Service: General;  Laterality: N/A;  with intraoperative cholangiogram  . ESOPHAGOGASTRODUODENOSCOPY (EGD) WITH PROPOFOL N/A 01/14/2015   Procedure: ESOPHAGOGASTRODUODENOSCOPY (EGD) WITH PROPOFOL;  Surgeon: Garlan Fair, MD;  Location: [facility];  Service: Endoscopy;  Laterality: N/A;  . EYE SURGERY Bilateral    cataract  . HERNIA REPAIR  ~ 2010;    lap; [facility]  . HERNIA REPAIR  ~ [id number]   lap vh complicated by recurrence  . HERNIA REPAIR  09/2011   open; [facility]  . I&D EXTREMITY Left 10/20/2012   Procedure: LEFT LEG IRRIGATION AND DEBRIDEMENT, AND WOUND VAC APPLICATION;  Surgeon: Marianna Payment, MD;  Location: [facility];  Service: Orthopedics;  Laterality: Left;  . I&D EXTREMITY Left 10/22/2012   Procedure: IRRIGATION AND DEBRIDEMENT EXTREMITY;  Surgeon:  Naiping Eduard Roux, MD;  Location: [facility];  Service: Orthopedics;  Laterality: Left;  . INSERT / REPLACE / REMOVE PACEMAKER  ~ 2009   initial placement  . JOINT REPLACEMENT    . ORIF ELBOW FRACTURE Left 03/29/2015   Procedure: LEFT ELBOW OPEN REDUCTION INTERNAL FIXATION (ORIF) DISTAL HUMERUS FRACTURE WITH OLECRANON OSTEOTOMY AND ULNAR NERVE RELEASE;  Surgeon: Roseanne Kaufman, MD;  Location: [facility];  Service: Orthopedics;  Laterality: Left;  . SKIN SPLIT GRAFT Left 10/22/2012   Procedure: SKIN GRAFT SPLIT THICKNESS;  Surgeon: Marianna Payment, MD;  Location: [facility];  Service:  Orthopedics;  Laterality: Left;  . TOTAL KNEE ARTHROPLASTY  1990's   right    Family History  Problem Relation Age of Onset  . Heart disease Father   . Heart disease Mother   . Colon cancer Mother   . Stroke Mother   . Heart attack Neg Hx   . Hypertension Neg Hx     Social history:  reports that he quit smoking about 43 years ago. His smoking use included Cigarettes. He has a 52.50 pack-year smoking history. He has never used smokeless tobacco. He reports that he does not drink alcohol or use drugs.   No Known Allergies  Medications:  Prior to Admission medications   Medication Sig Start Date End Date Taking? Authorizing Provider  albuterol (PROVENTIL HFA;VENTOLIN HFA) 108 (90 Base) MCG/ACT inhaler Inhale 2 puffs into the lungs every 6 (six) hours as needed for wheezing or shortness of breath. 09/19/15  Yes Rigoberto Noel, MD  aspirin EC 81 MG tablet Take 81 mg by mouth daily.   Yes Historical Provider, MD  calcium carbonate 100 mg/ml SUSP Take 600 mg by mouth.   Yes Historical Provider, MD  darbepoetin alfa-polysorbate (ARANESP, ALB FREE, SURECLICK) [id number] MCG/ML injection Inject 500 mcg into the skin See admin instructions. Every 2 months   Yes Historical Provider, MD  furosemide (LASIX) 40 MG tablet Take 40 mg by mouth See admin instructions. Take 40mg  daily.  May take an additional 40mg  for leg swelling   Yes Historical Provider, MD  lisinopril (PRINIVIL,ZESTRIL) 20 MG tablet Take 0.5 tablets (10 mg total) by mouth daily. 02/10/16  Yes Deboraha Sprang, MD  Multiple Vitamins-Minerals (PRESERVISION AREDS PO) Take 1 tablet by mouth 2 (two) times daily.   Yes Historical Provider, MD  mupirocin ointment (BACTROBAN) 2 % Apply 1 application topically 2 (two) times daily. 12/15/15  Yes Newt Minion, MD  omeprazole (PRILOSEC) 20 MG capsule Take 20 mg by mouth daily.    Yes Historical Provider, MD  oxybutynin (DITROPAN XL) 15 MG 24 hr tablet Take 15 mg by mouth every other day.    Yes Historical  Provider, MD  oxyCODONE (OXY IR/ROXICODONE) 5 MG immediate release tablet Take 1-2 tablets (5-10 mg total) by mouth every 3 (three) hours as needed for moderate pain. 04/01/15  Yes Avelina Laine, PA-C  Umeclidinium-Vilanterol (ANORO ELLIPTA) 62.5-25 MCG/INH AEPB Inhale 1 puff into the lungs daily.    Yes Historical Provider, MD  vitamin B-12 (CYANOCOBALAMIN) 500 MCG tablet Take 500 mcg by mouth daily.    Yes Historical Provider, MD  gabapentin (NEURONTIN) 100 MG capsule One capsule in the morning and 2 in the evening 02/25/16   Kathrynn Ducking, MD    ROS:  Out of a complete 14 system review of symptoms, the patient complains only of the following symptoms, and all other reviewed systems are negative.  Fatigue Hearing loss, ringing  in the ears, runny nose Loss of vision Shortness of breath Leg swelling Joint pain, walking difficulty Bruising easily Memory loss, dizziness, numbness, weakness  Blood pressure 121/64, pulse 77, height 5\' 10"  (1.778 m), weight 178 lb (80.7 kg).  Physical Exam  General: The patient is alert and cooperative at the time of the examination.  Skin: No significant peripheral edema is noted.   Neurologic Exam  Mental status: The patient is alert and oriented x 3 at the time of the examination. The patient has apparent normal recent and remote memory, with an apparently normal attention span and concentration ability.   Cranial nerves: Facial symmetry is present. Speech is normal, no aphasia or dysarthria is noted. Extraocular movements are full. Visual fields are full. The patient wears hearing aids. Soft touch sensation on the face is symmetric.  Motor: The patient has good strength in all 4 extremities.  Sensory examination: Soft touch sensation is symmetric on the face, but is somewhat decreased on the right arm and right leg as compared to the left.  Coordination: The patient has good finger-nose-finger and heel-to-shin bilaterally.  Gait and  station: The patient has a slightly wide-based gait, the patient will drag the right leg some. Tandem gait is not tested. Romberg is negative. No drift is seen.  Reflexes: Deep tendon reflexes are symmetric.   Assessment/Plan:  1. Peripheral neuropathy  2. Lumbosacral spinal stenosis, chronic low back pain  3. Gait instability  4. Chronic dizziness, pressure sensation  The dizziness and pressure sensations parallel one another, consistent with a muscle tension headache. The patient was given a trial on Cymbalta which seemed to help initially, but he began having too many side effects on higher doses and had to stop the medication. The patient will be given a trial on low-dose gabapentin. A prescription was written for this, he will follow-up in 4 or 5 months.   Jill Alexanders MD 02/25/2016 1:07 PM  [facility] [address] [place], [place] [address]  Phone [phone] Fax [phone]

## 2016-02-25 NOTE — Patient Instructions (Signed)
With the gabapentin 100 mg tablet, begin taking one capsule twice a day for 3 weeks, then take one in the morning and 2 in the evening, call if you have any side effects or require a dose adjustment.  Neurontin (gabapentin) may result in drowsiness, ankle swelling, gait instability, or possibly dizziness. Please contact our office if significant side effects occur with this medication.

## 2016-03-09 DIAGNOSIS — G603 Idiopathic progressive neuropathy: Secondary | ICD-10-CM | POA: Diagnosis not present

## 2016-03-09 DIAGNOSIS — G894 Chronic pain syndrome: Secondary | ICD-10-CM | POA: Diagnosis not present

## 2016-03-09 DIAGNOSIS — M4316 Spondylolisthesis, lumbar region: Secondary | ICD-10-CM | POA: Diagnosis not present

## 2016-03-09 DIAGNOSIS — Z79891 Long term (current) use of opiate analgesic: Secondary | ICD-10-CM | POA: Diagnosis not present

## 2016-03-16 ENCOUNTER — Ambulatory Visit: Payer: Medicare Other | Admitting: Neurology

## 2016-04-06 DIAGNOSIS — R159 Full incontinence of feces: Secondary | ICD-10-CM | POA: Diagnosis not present

## 2016-04-09 DIAGNOSIS — G5622 Lesion of ulnar nerve, left upper limb: Secondary | ICD-10-CM | POA: Diagnosis not present

## 2016-04-09 DIAGNOSIS — R2689 Other abnormalities of gait and mobility: Secondary | ICD-10-CM | POA: Diagnosis not present

## 2016-04-09 DIAGNOSIS — D649 Anemia, unspecified: Secondary | ICD-10-CM | POA: Diagnosis not present

## 2016-04-09 DIAGNOSIS — G4700 Insomnia, unspecified: Secondary | ICD-10-CM | POA: Diagnosis not present

## 2016-04-09 DIAGNOSIS — I10 Essential (primary) hypertension: Secondary | ICD-10-CM | POA: Diagnosis not present

## 2016-04-09 DIAGNOSIS — Z79899 Other long term (current) drug therapy: Secondary | ICD-10-CM | POA: Diagnosis not present

## 2016-04-09 DIAGNOSIS — K5900 Constipation, unspecified: Secondary | ICD-10-CM | POA: Diagnosis not present

## 2016-04-09 DIAGNOSIS — D692 Other nonthrombocytopenic purpura: Secondary | ICD-10-CM | POA: Diagnosis not present

## 2016-04-09 DIAGNOSIS — R600 Localized edema: Secondary | ICD-10-CM | POA: Diagnosis not present

## 2016-04-09 DIAGNOSIS — R42 Dizziness and giddiness: Secondary | ICD-10-CM | POA: Diagnosis not present

## 2016-04-28 DIAGNOSIS — R159 Full incontinence of feces: Secondary | ICD-10-CM | POA: Diagnosis not present

## 2016-05-12 ENCOUNTER — Telehealth: Payer: Self-pay | Admitting: Cardiology

## 2016-05-12 ENCOUNTER — Ambulatory Visit (INDEPENDENT_AMBULATORY_CARE_PROVIDER_SITE_OTHER): Payer: Medicare Other | Admitting: *Deleted

## 2016-05-12 DIAGNOSIS — I495 Sick sinus syndrome: Secondary | ICD-10-CM | POA: Diagnosis not present

## 2016-05-12 NOTE — Telephone Encounter (Signed)
Spoke with pt and reminded pt of remote transmission that is due today. Pt verbalized understanding.   

## 2016-05-13 LAB — CUP PACEART REMOTE DEVICE CHECK
Battery Impedance: 1213 Ohm
Battery Remaining Longevity: 40 mo
Battery Voltage: 2.75 V
Brady Statistic AS VP Percent: 1 %
Brady Statistic AS VS Percent: 21 %
Implantable Lead Implant Date: 20080826
Implantable Lead Location: 753860
Implantable Lead Model: 5076
Implantable Lead Model: 5076
Implantable Pulse Generator Implant Date: 20080826
Lead Channel Pacing Threshold Amplitude: 0.875 V
Lead Channel Pacing Threshold Pulse Width: 0.4 ms
Lead Channel Setting Pacing Amplitude: 1.75 V
Lead Channel Setting Pacing Pulse Width: 0.4 ms
Lead Channel Setting Sensing Sensitivity: 2 mV
MDC IDC LEAD IMPLANT DT: 20080826
MDC IDC LEAD LOCATION: 753859
MDC IDC MSMT LEADCHNL RA IMPEDANCE VALUE: 434 Ohm
MDC IDC MSMT LEADCHNL RA PACING THRESHOLD PULSEWIDTH: 0.4 ms
MDC IDC MSMT LEADCHNL RV IMPEDANCE VALUE: 502 Ohm
MDC IDC MSMT LEADCHNL RV PACING THRESHOLD AMPLITUDE: 1.125 V
MDC IDC SESS DTM: 20180328145308
MDC IDC SET LEADCHNL RV PACING AMPLITUDE: 2.5 V
MDC IDC STAT BRADY AP VP PERCENT: 5 %
MDC IDC STAT BRADY AP VS PERCENT: 73 %

## 2016-05-13 NOTE — Progress Notes (Signed)
Remote pacemaker transmission.   

## 2016-05-14 ENCOUNTER — Encounter: Payer: Self-pay | Admitting: Cardiology

## 2016-05-18 ENCOUNTER — Ambulatory Visit (HOSPITAL_BASED_OUTPATIENT_CLINIC_OR_DEPARTMENT_OTHER): Payer: Medicare Other | Admitting: Oncology

## 2016-05-18 ENCOUNTER — Ambulatory Visit: Payer: Medicare Other

## 2016-05-18 ENCOUNTER — Telehealth: Payer: Self-pay | Admitting: Oncology

## 2016-05-18 ENCOUNTER — Other Ambulatory Visit (HOSPITAL_BASED_OUTPATIENT_CLINIC_OR_DEPARTMENT_OTHER): Payer: Medicare Other

## 2016-05-18 VITALS — BP 126/57 | HR 88 | Temp 98.2°F | Resp 18 | Ht 70.0 in | Wt 180.8 lb

## 2016-05-18 DIAGNOSIS — N289 Disorder of kidney and ureter, unspecified: Secondary | ICD-10-CM | POA: Diagnosis not present

## 2016-05-18 DIAGNOSIS — Z8546 Personal history of malignant neoplasm of prostate: Secondary | ICD-10-CM

## 2016-05-18 DIAGNOSIS — D649 Anemia, unspecified: Secondary | ICD-10-CM | POA: Diagnosis not present

## 2016-05-18 DIAGNOSIS — D500 Iron deficiency anemia secondary to blood loss (chronic): Secondary | ICD-10-CM

## 2016-05-18 LAB — COMPREHENSIVE METABOLIC PANEL
ALBUMIN: 4.1 g/dL (ref 3.5–5.0)
ALK PHOS: 71 U/L (ref 40–150)
ALT: 10 U/L (ref 0–55)
ANION GAP: 11 meq/L (ref 3–11)
AST: 22 U/L (ref 5–34)
BILIRUBIN TOTAL: 0.68 mg/dL (ref 0.20–1.20)
BUN: 36.4 mg/dL — ABNORMAL HIGH (ref 7.0–26.0)
CO2: 24 mEq/L (ref 22–29)
Calcium: 9.3 mg/dL (ref 8.4–10.4)
Chloride: 108 mEq/L (ref 98–109)
Creatinine: 1.4 mg/dL — ABNORMAL HIGH (ref 0.7–1.3)
EGFR: 46 mL/min/{1.73_m2} — AB (ref >90)
GLUCOSE: 102 mg/dL (ref 70–140)
POTASSIUM: 4.1 meq/L (ref 3.5–5.1)
SODIUM: 143 meq/L (ref 136–145)
TOTAL PROTEIN: 6.8 g/dL (ref 6.4–8.3)

## 2016-05-18 LAB — CBC WITH DIFFERENTIAL/PLATELET
BASO%: 0.2 % (ref 0.0–2.0)
BASOS ABS: 0 10*3/uL (ref 0.0–0.1)
EOS ABS: 0.2 10*3/uL (ref 0.0–0.5)
EOS%: 2.5 % (ref 0.0–7.0)
HCT: 33.9 % — ABNORMAL LOW (ref 38.4–49.9)
HEMOGLOBIN: 11 g/dL — AB (ref 13.0–17.1)
LYMPH%: 19.8 % (ref 14.0–49.0)
MCH: 31.3 pg (ref 27.2–33.4)
MCHC: 32.4 g/dL (ref 32.0–36.0)
MCV: 96.3 fL (ref 79.3–98.0)
MONO#: 0.4 10*3/uL (ref 0.1–0.9)
MONO%: 6.1 % (ref 0.0–14.0)
NEUT#: 4.3 10*3/uL (ref 1.5–6.5)
NEUT%: 71.4 % (ref 39.0–75.0)
PLATELETS: 117 10*3/uL — AB (ref 140–400)
RBC: 3.52 10*6/uL — AB (ref 4.20–5.82)
RDW: 13.6 % (ref 11.0–14.6)
WBC: 6 10*3/uL (ref 4.0–10.3)
lymph#: 1.2 10*3/uL (ref 0.9–3.3)

## 2016-05-18 LAB — IRON AND TIBC
%SAT: 31 % (ref 20–55)
Iron: 88 ug/dL (ref 42–163)
TIBC: 281 ug/dL (ref 202–409)
UIBC: 193 ug/dL (ref 117–376)

## 2016-05-18 LAB — FERRITIN: Ferritin: 98 ng/ml (ref 22–316)

## 2016-05-18 NOTE — Telephone Encounter (Signed)
Gave patient AVS and calender per 05/18/2016 los. Lab/inj/md  In 6 months.

## 2016-05-18 NOTE — Progress Notes (Signed)
Hematology and Oncology Follow Up Visit  ALYXANDER KOLLMANN [id number] 1924-11-30 83 y.o. 05/18/2016 1:20 PM  CC: Hal T. Felipa Eth, M.D.  Thana Farr. Karsten Ro, M.D.    Principle Diagnosis: 83 year old gentleman with the following diagnoses. 1. Multifactorial anemia.  He has anemia of chronic disease.  It may be anemia of renal insufficiency. 2. History of prostate cancer status post radiation therapy completed in 2008.  Current therapy: He is on Aranesp 300 mcg subcutaneously Intermittently to keep his hemoglobin above 10.  Interim History: Mr. Debes presents today for a followup visit. Since his last visit, he reports no recent changes in his health. He continues to live independently including driving and attending to activities of daily living. He does report lower extremity edema which has not changed dramatically. He does use Lasix periodically. He does not report any excessive fatigue, tiredness or changes in his performance status. He does not report any hematochezia or melena. He denies any recent hospitalizations or illnesses.  He does not report any headaches or blurry vision or seizure. He does not report any fevers, chills or sweats. He reports no chest pain or palpitation. He has not reported any wheezing or shortness of breath. Does have coronary nausea or vomiting or abdominal pain. Remainder of his review of systems unremarkable.   Medications: I have reviewed the patient's current medications. Current Outpatient Prescriptions  Medication Sig Dispense Refill  . albuterol (PROVENTIL HFA;VENTOLIN HFA) 108 (90 Base) MCG/ACT inhaler Inhale 2 puffs into the lungs every 6 (six) hours as needed for wheezing or shortness of breath. 1 Inhaler 6  . aspirin EC 81 MG tablet Take 81 mg by mouth daily.    . calcium carbonate 100 mg/ml SUSP Take 600 mg by mouth.    . darbepoetin alfa-polysorbate (ARANESP, ALB FREE, SURECLICK) 626 MCG/ML injection Inject 500 mcg into the skin See admin  instructions. Every 2 months    . furosemide (LASIX) 40 MG tablet Take 40 mg by mouth See admin instructions. Take 40mg  daily.  May take an additional 40mg  for leg swelling    . gabapentin (NEURONTIN) 100 MG capsule One capsule in the morning and 2 in the evening 90 capsule 3  . lisinopril (PRINIVIL,ZESTRIL) 20 MG tablet Take 0.5 tablets (10 mg total) by mouth daily. 45 tablet 3  . Multiple Vitamins-Minerals (PRESERVISION AREDS PO) Take 1 tablet by mouth 2 (two) times daily.    . mupirocin ointment (BACTROBAN) 2 % Apply 1 application topically 2 (two) times daily. 22 g 6  . omeprazole (PRILOSEC) 20 MG capsule Take 20 mg by mouth daily.     Marland Kitchen oxybutynin (DITROPAN XL) 15 MG 24 hr tablet Take 15 mg by mouth every other day.     . oxyCODONE (OXY IR/ROXICODONE) 5 MG immediate release tablet Take 1-2 tablets (5-10 mg total) by mouth every 3 (three) hours as needed for moderate pain. 40 tablet 0  . Umeclidinium-Vilanterol (ANORO ELLIPTA) 62.5-25 MCG/INH AEPB Inhale 1 puff into the lungs daily.     . vitamin B-12 (CYANOCOBALAMIN) 500 MCG tablet Take 500 mcg by mouth daily.      No current facility-administered medications for this visit.     Allergies: No Known Allergies  Past Medical History, Surgical history, Social history, and Family History were reviewed and updated.    Physical Exam: Blood pressure (!) 126/57, pulse 88, temperature 98.2 F (36.8 C), temperature source Oral, resp. rate 18, height 5\' 10"  (1.778 m), weight 180 lb 12.8 oz (82 kg), SpO2  100 %. ECOG: 1 General appearance: Alert, awake gentleman without distress. Head: Normocephalic, without obvious abnormality oral mucosa without ulcers or lesions. Neck: no adenopathy Lymph nodes: Cervical, supraclavicular, and axillary nodes normal. Heart:regular rate and rhythm, S1, S2 normal, no murmur, click, rub or gallop Lung:chest clear, no wheezing, rales, normal symmetric air entry Abdomen: soft, non-tender, without masses or  organomegaly no rebound or guarding. EXT:no erythema, induration, or nodules   Lab Results: Lab Results  Component Value Date   WBC 6.0 05/18/2016   HGB 11.0 (L) 05/18/2016   HCT 33.9 (L) 05/18/2016   MCV 96.3 05/18/2016   PLT 117 (L) 05/18/2016     Chemistry      Component Value Date/Time   NA 141 08/06/2015 1454   K 4.3 08/06/2015 1454   CL 102 08/06/2015 1454   CO2 25 08/06/2015 1454   BUN 29 (H) 08/06/2015 1454   CREATININE 1.14 (H) 08/06/2015 1454      Component Value Date/Time   CALCIUM 8.6 08/06/2015 1454   ALKPHOS 83 10/17/2012 1555   AST 21 10/17/2012 1555   ALT 11 10/17/2012 1555   BILITOT 0.4 10/17/2012 1555       Impression and Plan:  83 year old gentleman with the following issues. 1. Multifactorial anemia.  There is an element of anemia of renal insufficiency.  Currently on Aranesp 300 mcg to keep his hemoglobin between 10 and 11. He has not received Aranesp for the last 10 months and his hemoglobin remains close to normal range. For the time being, I will hold off on any further Aranesp injection unless his hemoglobin drops down further. We'll repeat his hemoglobin in 6 months. 2. Prostate cancer. His PSA is being checked routinely by Dr. Karsten Ro. No evidence of relapse noted. 3. Follow-up will be in 6 months sooner if needed to.  Zola Button, MD 4/3/20181:20 PM

## 2016-05-25 ENCOUNTER — Ambulatory Visit: Payer: Medicare Other | Admitting: Neurology

## 2016-06-01 DIAGNOSIS — Z79891 Long term (current) use of opiate analgesic: Secondary | ICD-10-CM | POA: Diagnosis not present

## 2016-06-01 DIAGNOSIS — G894 Chronic pain syndrome: Secondary | ICD-10-CM | POA: Diagnosis not present

## 2016-06-01 DIAGNOSIS — M4316 Spondylolisthesis, lumbar region: Secondary | ICD-10-CM | POA: Diagnosis not present

## 2016-06-14 ENCOUNTER — Encounter: Payer: Self-pay | Admitting: Interventional Cardiology

## 2016-06-14 ENCOUNTER — Encounter (INDEPENDENT_AMBULATORY_CARE_PROVIDER_SITE_OTHER): Payer: Self-pay

## 2016-06-14 ENCOUNTER — Ambulatory Visit (INDEPENDENT_AMBULATORY_CARE_PROVIDER_SITE_OTHER): Payer: Medicare Other | Admitting: Interventional Cardiology

## 2016-06-14 VITALS — BP 124/72 | HR 65 | Wt 179.0 lb

## 2016-06-14 DIAGNOSIS — I480 Paroxysmal atrial fibrillation: Secondary | ICD-10-CM

## 2016-06-14 DIAGNOSIS — Z950 Presence of cardiac pacemaker: Secondary | ICD-10-CM

## 2016-06-14 DIAGNOSIS — I10 Essential (primary) hypertension: Secondary | ICD-10-CM

## 2016-06-14 DIAGNOSIS — R0609 Other forms of dyspnea: Secondary | ICD-10-CM | POA: Diagnosis not present

## 2016-06-14 DIAGNOSIS — R600 Localized edema: Secondary | ICD-10-CM

## 2016-06-14 NOTE — Patient Instructions (Addendum)
Medication Instructions:  Your physician recommends that you continue on your current medications as directed. Please refer to the Current Medication list given to you today.   Labwork: NONE ORDERED   Testing/Procedures: NONE ORDERED  Follow-Up: Your physician wants you to follow-up in: [place] DR. VARANASI You will receive a reminder letter in the mail two months in advance. If you don't receive a letter, please call our office to schedule the follow-up appointment.   Any Other Special Instructions Will Be Listed Below (If Applicable). KEEP LEGS ELEVATED WHEN YOU ARE SITTING .   If you need a refill on your cardiac medications before your next appointment, please call your pharmacy.

## 2016-06-14 NOTE — Progress Notes (Signed)
Patient ID: [name] [name], male   DOB: May 22, 1924, 83 y.o.   MRN: [id number]     Cardiology Office Note   Date:  06/14/2016   ID:  [name] [name], DOB [birth year], MRN [id number]  PCP:  [name], MD    No chief complaint on file. f/u AFib   Wt Readings from Last 3 Encounters:  06/14/16 179 lb (81.2 kg)  05/18/16 180 lb 12.8 oz (82 kg)  02/25/16 178 lb (80.7 kg)       History of Present Illness: [name] [name] is a 83 y.o. male  who had gallstone pancreatitis and subsequent cholecystectomy in 2013. He has a h/o PAF and HTN.  No chest pain. No palpitations. Occasional DOE. Less active in the yard, due to leg pain- neuropathy. Also has decreased balance. BP has been in the 951-884 range systolic. Lowest was 166 systolic.  He uses CPAP. Overall, he feels more tired but is able to get done the things he needs done. Energy level is low. Balance problem limits walking.  He has fallen a few times.  He has a cane but does not use it all of the time.   He does not do much walking.  He has fallen several times.   No chest pain.  He has a balance issue.     No palpitations.  BP at home has been in the 063-016 range systolic.    Past Medical History:  Diagnosis Date  . Abnormality of gait 06/26/2015  . Anemia    occ. injections for this at [facility]  . Arthritis    "back"  . Chronic lower back pain   . COPD (chronic obstructive pulmonary disease) ([place]) 10/19/2011   "touch"  . Dizziness and giddiness 06/26/2015  . Exertional dyspnea 10/19/2011  . Fall   . GERD (gastroesophageal reflux disease)   . Hearing loss   . History of blood transfusion    "think it was related to pacemaker placement"  . Hypertension   . Leg swelling 10/19/2011   LLE  . OSA on CPAP 10/19/2011   "sometimes wear CPAP"  . Pacemaker    Medtronic  . Prostate cancer ([place])    S/P radiation tx ~ 2008  . Skin change    12-24-14 few scalp lesions burned off"old age spots"    Past Surgical  History:  Procedure Laterality Date  . BALLOON DILATION N/A 01/14/2015   Procedure: BALLOON DILATION;  Surgeon: Garlan Fair, MD;  Location: [facility];  Service: Endoscopy;  Laterality: N/A;  . CARDIAC CATHETERIZATION    . CARPAL TUNNEL RELEASE Left 11/14/2014   Procedure: CARPAL TUNNEL RELEASE LEFT THUMB;  Surgeon: Daryll Brod, MD;  Location: [facility];  Service: Orthopedics;  Laterality: Left;  ANESTHESIA:  IV REGIONAL FAB  . CATARACT EXTRACTION W/ INTRAOCULAR LENS  IMPLANT, BILATERAL    . CHOLECYSTECTOMY  10/23/2011  . CHOLECYSTECTOMY  10/23/2011   Procedure: CHOLECYSTECTOMY;  Surgeon: Rolm Bookbinder, MD;  Location: [facility];  Service: General;  Laterality: N/A;  with intraoperative cholangiogram  . ESOPHAGOGASTRODUODENOSCOPY (EGD) WITH PROPOFOL N/A 01/14/2015   Procedure: ESOPHAGOGASTRODUODENOSCOPY (EGD) WITH PROPOFOL;  Surgeon: Garlan Fair, MD;  [facility];  Service: Endoscopy;  Laterality: N/A;  . EYE SURGERY Bilateral    cataract  . HERNIA REPAIR  ~ 2010;    lap; VHR  . HERNIA REPAIR  ~ 0109   lap vh complicated by recurrence  . HERNIA REPAIR  09/2011  open; VHR  . I&D EXTREMITY Left 10/20/2012   Procedure: LEFT LEG IRRIGATION AND DEBRIDEMENT, AND WOUND VAC APPLICATION;  Surgeon: Marianna Payment, MD;  [facility];  Service: Orthopedics;  Laterality: Left;  . I&D EXTREMITY Left 10/22/2012   Procedure: IRRIGATION AND DEBRIDEMENT EXTREMITY;  Surgeon: Marianna Payment, MD;  [facility];  Service: Orthopedics;  Laterality: Left;  . INSERT / REPLACE / REMOVE PACEMAKER  ~ 2009   initial placement  . JOINT REPLACEMENT    . ORIF ELBOW FRACTURE Left 03/29/2015   Procedure: LEFT ELBOW OPEN REDUCTION INTERNAL FIXATION (ORIF) DISTAL HUMERUS FRACTURE WITH OLECRANON OSTEOTOMY AND ULNAR NERVE RELEASE;  Surgeon: Roseanne Kaufman, MD;  Location: [facility];  Service: Orthopedics;  Laterality: Left;  . SKIN SPLIT GRAFT Left 10/22/2012   Procedure: SKIN GRAFT  SPLIT THICKNESS;  Surgeon: Marianna Payment, MD;  [facility];  Service: Orthopedics;  Laterality: Left;  . TOTAL KNEE ARTHROPLASTY  1990's   right     Current Outpatient Prescriptions  Medication Sig Dispense Refill  . albuterol (PROVENTIL HFA;VENTOLIN HFA) 108 (90 Base) MCG/ACT inhaler Inhale 2 puffs into the lungs every 6 (six) hours as needed for wheezing or shortness of breath. 1 Inhaler 6  . aspirin EC 81 MG tablet Take 81 mg by mouth daily.    . calcium carbonate 100 mg/ml SUSP Take 600 mg by mouth.    . darbepoetin alfa-polysorbate (ARANESP, ALB FREE, SURECLICK) 710 MCG/ML injection Inject 500 mcg into the skin every 6 (six) months. Every 2 months    . furosemide (LASIX) 40 MG tablet Take 40 mg by mouth See admin instructions. Take 40mg  daily.  May take an additional 40mg  for leg swelling    . lisinopril (PRINIVIL,ZESTRIL) 20 MG tablet Take 0.5 tablets (10 mg total) by mouth daily. 45 tablet 3  . Multiple Vitamins-Minerals (PRESERVISION AREDS PO) Take 1 tablet by mouth 2 (two) times daily.    . mupirocin ointment (BACTROBAN) 2 % Apply 1 application topically 2 (two) times daily. 22 g 6  . omeprazole (PRILOSEC) 20 MG capsule Take 20 mg by mouth daily.     Marland Kitchen oxybutynin (DITROPAN XL) 15 MG 24 hr tablet Take 15 mg by mouth once a week.     Marland Kitchen oxyCODONE (OXY IR/ROXICODONE) 5 MG immediate release tablet Take 1-2 tablets (5-10 mg total) by mouth every 3 (three) hours as needed for moderate pain. (Patient taking differently: Take 5-10 mg by mouth every 6 (six) hours as needed for moderate pain. ) 40 tablet 0  . Umeclidinium-Vilanterol (ANORO ELLIPTA) 62.5-25 MCG/INH AEPB Inhale 1 puff into the lungs daily.     . vitamin B-12 (CYANOCOBALAMIN) 500 MCG tablet Take 500 mcg by mouth daily.      No current facility-administered medications for this visit.     Allergies:   Patient has no known allergies.    Social History:  The patient  reports that he quit smoking about 43 years ago. His  smoking use included Cigarettes. He has a 52.50 pack-year smoking history. He has never used smokeless tobacco. He reports that he does not drink alcohol or use drugs.   Family History:  The patient's family history includes Colon cancer in his mother; Heart disease in his father and mother; Stroke in his mother.    ROS:  Please see the history of present illness.   Otherwise, review of systems are positive for fatigue.   All other systems are reviewed and negative.  PHYSICAL EXAM: VS:  BP 124/72   Pulse 65   Wt 179 lb (81.2 kg)   SpO2 97%   BMI 25.68 kg/m  , BMI Body mass index is 25.68 kg/m. GEN: Well nourished, well developed, in no acute distress  HEENT: normal  Neck: no JVD, carotid bruits, or masses Cardiac: RRR; no murmurs, rubs, or gallops,; bilateral lower extremity edema  Respiratory:  clear to auscultation bilaterally, normal work of breathing GI: soft, nontender, nondistended, + BS MS: no deformity or atrophy  Skin: warm and dry, no rash Neuro:  Strength and sensation are intact, slow gait Psych: euthymic mood, full affect   EKG:   The ekg ordered today demonstrates atrial pacing, no significant change from prior   Recent Labs: 08/06/2015: Magnesium 1.8 05/18/2016: ALT 10; BUN 36.4; Creatinine 1.4; HGB 11.0; Platelets 117; Potassium 4.1; Sodium 143   Lipid Panel No results found for: CHOL, TRIG, HDL, CHOLHDL, VLDL, LDLCALC, LDLDIRECT   Other studies Reviewed: Additional studies/ records that were reviewed today with results demonstrating: LDL 68 in 4/16.   ASSESSMENT AND PLAN:  1. AFib:  Not a good candidate for anticoagulation.  Had PAF several years ago.  No sx at this time.  He is not interested in anticoagulation. Continue aspirin at this time. 2. DOE: likely due to deconditioning. His activity is limited by multiple factors. I asked him to try a stationary bike so that he could increase his heart rate without risking falling. He does not have access to a  bike at this time. 3. Abnormal echo: inferior hypokinessis on prior echo.  No angina.  No further ischemic workup at this time. 4. Bilateral edema: CONtinue furosemide.  Elevate legs at night.  He is allowed to take additional furosemide to help with his lower chimney swelling. He can also use compression stockings.  Venous insufficiency. 5. Pacer: followed by Dr. Caryl Comes. Atrial paced on ECG. Continue routine pacemaker checks. 6. Lipids : normal with Dr. Felipa Eth per his report.  HTN controlled.  7. HTN: Blood pressure well controlled. I personally reviewed his home readings.   Current medicines are reviewed at length with the patient today.  The patient concerns regarding his medicines were addressed.  The following changes have been made:  No change  Labs/ tests ordered today include:  No orders of the defined types were placed in this encounter.   Recommend 150 minutes/week of aerobic exercise, recumbent bike would be preferable to prevent falls Low fat, low carb, high fiber diet recommended  Disposition:   FU in approx 12 months, f/u with Dr. Caryl Comes as well   Signed, Larae Grooms, MD  06/14/2016 4:21 PM    [facility] [place], [place], [place]  [address] Phone: [phone]; Fax: [phone]

## 2016-06-16 ENCOUNTER — Encounter (INDEPENDENT_AMBULATORY_CARE_PROVIDER_SITE_OTHER): Payer: Self-pay | Admitting: Orthopedic Surgery

## 2016-06-16 ENCOUNTER — Ambulatory Visit (INDEPENDENT_AMBULATORY_CARE_PROVIDER_SITE_OTHER): Payer: Medicare Other | Admitting: Orthopedic Surgery

## 2016-06-16 VITALS — Ht 70.0 in | Wt 179.0 lb

## 2016-06-16 DIAGNOSIS — M7542 Impingement syndrome of left shoulder: Secondary | ICD-10-CM | POA: Diagnosis not present

## 2016-06-16 DIAGNOSIS — M7541 Impingement syndrome of right shoulder: Secondary | ICD-10-CM | POA: Insufficient documentation

## 2016-06-16 MED ORDER — METHYLPREDNISOLONE ACETATE 40 MG/ML IJ SUSP
40.0000 mg | INTRAMUSCULAR | Status: AC | PRN
  Administered 2016-06-16: 40 mg via INTRA_ARTICULAR

## 2016-06-16 MED ORDER — LIDOCAINE HCL 1 % IJ SOLN
5.0000 mL | INTRAMUSCULAR | Status: AC | PRN
  Administered 2016-06-16: 5 mL

## 2016-06-16 NOTE — Progress Notes (Signed)
Office Visit Note   Patient: [name] [name]           Date of Birth: Jun 26, 1924           MRN: [id number] Visit Date: 06/16/2016              Requested by: [name], MD [place], [place] [id number] PCP: [name], MD  Chief Complaint  Patient presents with  . Left Shoulder - Pain  . Right Shoulder - Pain      HPI: Patient is a 83-year-old gentleman who complains of some chronic back pain since pain is getting his worse when he is standing or walking better sitting or lying down. Complains of increasing impingement pain in both shoulders and lateral epicondylitis of the right elbow.  Assessment & Plan: Visit Diagnoses:  1. Impingement syndrome of right shoulder   2. Impingement syndrome of left shoulder     Plan: Both shoulders were injected without complications patient was given instruction of the proper location to wear his tennis elbow strap recommended strengthening to help with his back symptoms I reviewed his CT scan of his lumbar spine from 2016 with no specific complicating features.  Follow-Up Instructions: Return if symptoms worsen or fail to improve.   Ortho Exam  Patient is alert, oriented, no adenopathy, well-dressed, normal affect, normal respiratory effort. On examination patient has an antalgic gait difficulty getting from sitting to a standing position. Patient has pain with Neer and Hawkins impingement test with both shoulders with significant atrophy and a Toney of both shoulders. Examination he has ecchymosis and bruising and upper and lower extremities with venous stasis changes in his legs he is tender to palpation of the lateral condyle of the right elbow.  Imaging: No results found.  Labs: Lab Results  Component Value Date   CRP 16.2 (H) 10/20/2011   LABURIC 4.7 10/17/2006   REPTSTATUS 03/31/2015 FINAL 03/30/2015   GRAMSTAIN Abundant 12/18/2015   GRAMSTAIN WBC present-predominately PMN 12/18/2015   GRAMSTAIN No Squamous  Epithelial Cells Seen 12/18/2015   GRAMSTAIN No Organisms Seen 12/18/2015   CULT 9,000 COLONIES/mL INSIGNIFICANT GROWTH 03/30/2015   LABORGA NORMAL SKIN FLORA 12/18/2015    Orders:  No orders of the defined types were placed in this encounter.  No orders of the defined types were placed in this encounter.    Procedures: Large Joint Inj Date/Time: 06/16/2016 2:46 PM Performed by: Djuan Talton V Authorized by: Newt Minion   Consent Given by:  Patient Site marked: the procedure site was marked   Timeout: prior to procedure the correct patient, procedure, and site was verified   Indications:  Pain and diagnostic evaluation Location:  Shoulder Site:  R subacromial bursa Prep: patient was prepped and draped in usual sterile fashion   Needle Size:  22 G Needle Length:  1.5 inches Approach:  Posterior Ultrasound Guidance: No   Fluoroscopic Guidance: No   Arthrogram: No   Medications:  5 mL lidocaine 1 %; 40 mg methylPREDNISolone acetate 40 MG/ML Aspiration Attempted: No   Patient tolerance:  Patient tolerated the procedure well with no immediate complications Large Joint Inj Date/Time: 06/16/2016 2:47 PM Performed by: Isacc Turney V Authorized by: Meridee Score V   Consent Given by:  Patient Site marked: the procedure site was marked   Timeout: prior to procedure the correct patient, procedure, and site was verified   Indications:  Pain and diagnostic evaluation Location:  Shoulder Site:  L subacromial  bursa Prep: patient was prepped and draped in usual sterile fashion   Needle Size:  22 G Needle Length:  1.5 inches Approach:  Posterior Ultrasound Guidance: No   Fluoroscopic Guidance: No   Arthrogram: No   Medications:  5 mL lidocaine 1 %; 40 mg methylPREDNISolone acetate 40 MG/ML Aspiration Attempted: No   Patient tolerance:  Patient tolerated the procedure well with no immediate complications    Clinical Data: No additional findings.  ROS:  All other systems  negative, except as noted in the HPI. Review of Systems  Objective: Vital Signs: Ht 5\' 10"  (1.778 m)   Wt 179 lb (81.2 kg)   BMI 25.68 kg/m   Specialty Comments:  No specialty comments available.  PMFS History: Patient Active Problem List   Diagnosis Date Noted  . Impingement syndrome of right shoulder 06/16/2016  . Impingement syndrome of left shoulder 06/16/2016  . Bilateral leg edema 06/14/2016  . Dizziness and giddiness 06/26/2015  . Abnormality of gait 06/26/2015  . Anxiety 04/14/2015  . GERD (gastroesophageal reflux disease) 04/03/2015  . OSA (obstructive sleep apnea) 04/03/2015  . HLD (hyperlipidemia) 04/03/2015  . SOB (shortness of breath)   . Left elbow fracture 03/29/2015  . Dizziness 01/01/2015  . LBP (low back pain) 01/01/2015  . Malignant neoplasm of prostate ([place]) 01/01/2015  . BP (high blood pressure) 01/01/2015  . DOE (dyspnea on exertion) 10/26/2013  . Essential hypertension, benign 10/26/2013  . Post-traumatic wound infection 10/17/2012  . Cellulitis 10/17/2012  . Atrial fibrillation ([place]) 10/22/2011  . DVT, lower extremity, distal, chronic ([place]) 10/22/2011  . Pacemaker 10/19/2011  . Prostate cancer ([place]) 10/19/2011  . COPD (chronic obstructive pulmonary disease) with emphysema ([place]) 02/04/2011  . Anemia 12/10/2010   Past Medical History:  Diagnosis Date  . Abnormality of gait 06/26/2015  . Anemia    occ. injections for this at [facility]  . Arthritis    "back"  . Chronic lower back pain   . COPD (chronic obstructive pulmonary disease) ([place]) 10/19/2011   "touch"  . Dizziness and giddiness 06/26/2015  . Exertional dyspnea 10/19/2011  . Fall   . GERD (gastroesophageal reflux disease)   . Hearing loss   . History of blood transfusion    "think it was related to pacemaker placement"  . Hypertension   . Leg swelling 10/19/2011   LLE  . OSA on CPAP 10/19/2011   "sometimes wear CPAP"  . Pacemaker    Medtronic  . Prostate cancer ([place])    S/P  radiation tx ~ 2008  . Skin change    12-24-14 few scalp lesions burned off"old age spots"    Family History  Problem Relation Age of Onset  . Heart disease Father   . Heart disease Mother   . Colon cancer Mother   . Stroke Mother   . Heart attack Neg Hx   . Hypertension Neg Hx     Past Surgical History:  Procedure Laterality Date  . BALLOON DILATION N/A 01/14/2015   Procedure: BALLOON DILATION;  Surgeon: Garlan Fair, MD;  Location: [facility];  Service: Endoscopy;  Laterality: N/A;  . CARDIAC CATHETERIZATION    . CARPAL TUNNEL RELEASE Left 11/14/2014   Procedure: CARPAL TUNNEL RELEASE LEFT THUMB;  Surgeon: Daryll Brod, MD;  Location: [facility];  Service: Orthopedics;  Laterality: Left;  ANESTHESIA:  IV REGIONAL FAB  . CATARACT EXTRACTION W/ INTRAOCULAR LENS  IMPLANT, BILATERAL    . CHOLECYSTECTOMY  10/23/2011  . CHOLECYSTECTOMY  10/23/2011   Procedure: CHOLECYSTECTOMY;  Surgeon: Rolm Bookbinder, MD;  Location: [facility];  Service: General;  Laterality: N/A;  with intraoperative cholangiogram  . ESOPHAGOGASTRODUODENOSCOPY (EGD) WITH PROPOFOL N/A 01/14/2015   Procedure: ESOPHAGOGASTRODUODENOSCOPY (EGD) WITH PROPOFOL;  Surgeon: Garlan Fair, MD;  [facility];  Service: Endoscopy;  Laterality: N/A;  . EYE SURGERY Bilateral    cataract  . HERNIA REPAIR  ~ 2010;    lap; VHR  . HERNIA REPAIR  ~ 4497   lap vh complicated by recurrence  . HERNIA REPAIR  09/2011   open; VHR  . I&D EXTREMITY Left 10/20/2012   Procedure: LEFT LEG IRRIGATION AND DEBRIDEMENT, AND WOUND VAC APPLICATION;  Surgeon: Marianna Payment, MD;  [facility];  Service: Orthopedics;  Laterality: Left;  . I&D EXTREMITY Left 10/22/2012   Procedure: IRRIGATION AND DEBRIDEMENT EXTREMITY;  Surgeon: Marianna Payment, MD;  [facility];  Service: Orthopedics;  Laterality: Left;  . INSERT / REPLACE / REMOVE PACEMAKER  ~ 2009   initial placement  . JOINT REPLACEMENT    . ORIF ELBOW FRACTURE  Left 03/29/2015   Procedure: LEFT ELBOW OPEN REDUCTION INTERNAL FIXATION (ORIF) DISTAL HUMERUS FRACTURE WITH OLECRANON OSTEOTOMY AND ULNAR NERVE RELEASE;  Surgeon: Roseanne Kaufman, MD;  Location: [facility];  Service: Orthopedics;  Laterality: Left;  . SKIN SPLIT GRAFT Left 10/22/2012   Procedure: SKIN GRAFT SPLIT THICKNESS;  Surgeon: Marianna Payment, MD;  [facility];  Service: Orthopedics;  Laterality: Left;  . TOTAL KNEE ARTHROPLASTY  1990's   right   Social History   Occupational History  . 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 Smoking status: Former Smoker    Packs/day: 1.50    Years: 35.00    Types: Cigarettes    Quit date: 02/15/1973  . Smokeless tobacco: Never Used  . Alcohol use No  . Drug use: No  . Sexual activity: No

## 2016-06-25 ENCOUNTER — Telehealth: Payer: Self-pay | Admitting: Interventional Cardiology

## 2016-06-25 NOTE — Telephone Encounter (Signed)
°  New Prob   Requesting to speak to a nurse regarding information discussed in most recent visit. Pt did not want to give details. Please call.

## 2016-06-30 ENCOUNTER — Telehealth: Payer: Self-pay | Admitting: Interventional Cardiology

## 2016-06-30 NOTE — Telephone Encounter (Signed)
New Message    Please call pt would like to speak with you , about a previous appointment he had with Dr Irish Lack

## 2016-06-30 NOTE — Telephone Encounter (Signed)
Patient calling and asking since Dr. Irish Lack recommended that he get a stationary bike at his last OV, patient is wondering if Dr. Irish Lack can write something to help Medicare cover this. Advised patient that I did not think that he could. Patient asked that I ask him anyway since he recommended it.

## 2016-07-01 NOTE — Telephone Encounter (Signed)
Patient made aware that that is not something we write an Rx for. Patient verbalized understanding and appreciated the call.

## 2016-07-05 DIAGNOSIS — R600 Localized edema: Secondary | ICD-10-CM | POA: Diagnosis not present

## 2016-07-05 DIAGNOSIS — D649 Anemia, unspecified: Secondary | ICD-10-CM | POA: Diagnosis not present

## 2016-07-05 DIAGNOSIS — Z0000 Encounter for general adult medical examination without abnormal findings: Secondary | ICD-10-CM | POA: Diagnosis not present

## 2016-07-05 DIAGNOSIS — G4700 Insomnia, unspecified: Secondary | ICD-10-CM | POA: Diagnosis not present

## 2016-07-05 DIAGNOSIS — K5900 Constipation, unspecified: Secondary | ICD-10-CM | POA: Diagnosis not present

## 2016-07-05 DIAGNOSIS — G4733 Obstructive sleep apnea (adult) (pediatric): Secondary | ICD-10-CM | POA: Diagnosis not present

## 2016-07-05 DIAGNOSIS — M48061 Spinal stenosis, lumbar region without neurogenic claudication: Secondary | ICD-10-CM | POA: Diagnosis not present

## 2016-07-05 DIAGNOSIS — K219 Gastro-esophageal reflux disease without esophagitis: Secondary | ICD-10-CM | POA: Diagnosis not present

## 2016-07-05 DIAGNOSIS — G629 Polyneuropathy, unspecified: Secondary | ICD-10-CM | POA: Diagnosis not present

## 2016-07-05 DIAGNOSIS — I10 Essential (primary) hypertension: Secondary | ICD-10-CM | POA: Diagnosis not present

## 2016-07-05 DIAGNOSIS — R159 Full incontinence of feces: Secondary | ICD-10-CM | POA: Diagnosis not present

## 2016-07-05 DIAGNOSIS — R252 Cramp and spasm: Secondary | ICD-10-CM | POA: Diagnosis not present

## 2016-07-06 ENCOUNTER — Other Ambulatory Visit (HOSPITAL_COMMUNITY): Payer: Self-pay | Admitting: Physical Medicine and Rehabilitation

## 2016-07-06 ENCOUNTER — Ambulatory Visit (HOSPITAL_COMMUNITY)
Admission: RE | Admit: 2016-07-06 | Discharge: 2016-07-06 | Disposition: A | Discharge status: Home / Self Care | Payer: Medicare Other | Source: Ambulatory Visit | Attending: Physical Medicine and Rehabilitation | Admitting: Physical Medicine and Rehabilitation

## 2016-07-06 DIAGNOSIS — M5134 Other intervertebral disc degeneration, thoracic region: Secondary | ICD-10-CM | POA: Insufficient documentation

## 2016-07-06 DIAGNOSIS — M48061 Spinal stenosis, lumbar region without neurogenic claudication: Secondary | ICD-10-CM | POA: Diagnosis not present

## 2016-07-06 DIAGNOSIS — M4186 Other forms of scoliosis, lumbar region: Secondary | ICD-10-CM | POA: Insufficient documentation

## 2016-07-06 DIAGNOSIS — M47814 Spondylosis without myelopathy or radiculopathy, thoracic region: Secondary | ICD-10-CM | POA: Diagnosis not present

## 2016-07-06 DIAGNOSIS — M4146 Neuromuscular scoliosis, lumbar region: Secondary | ICD-10-CM | POA: Diagnosis not present

## 2016-07-06 DIAGNOSIS — G894 Chronic pain syndrome: Secondary | ICD-10-CM | POA: Diagnosis not present

## 2016-07-06 DIAGNOSIS — M546 Pain in thoracic spine: Secondary | ICD-10-CM | POA: Insufficient documentation

## 2016-07-06 DIAGNOSIS — Z79891 Long term (current) use of opiate analgesic: Secondary | ICD-10-CM | POA: Diagnosis not present

## 2016-07-09 ENCOUNTER — Ambulatory Visit (INDEPENDENT_AMBULATORY_CARE_PROVIDER_SITE_OTHER): Payer: Medicare Other | Admitting: Podiatry

## 2016-07-09 DIAGNOSIS — M779 Enthesopathy, unspecified: Secondary | ICD-10-CM | POA: Diagnosis not present

## 2016-07-09 MED ORDER — TRIAMCINOLONE ACETONIDE 10 MG/ML IJ SUSP
10.0000 mg | Freq: Once | INTRAMUSCULAR | Status: AC
  Administered 2016-07-09: 10 mg

## 2016-07-10 NOTE — Progress Notes (Signed)
Subjective:    Patient ID: [name] [name], male   DOB: 83 y.o.   MRN: [id number]   HPI patient presents with severe discomfort in both ankles    ROS      Objective:  Physical Exam neurovascular status unchanged with very painful sinus tarsi bilateral     Assessment:   Sinus tarsitis bilateral      Plan:  Injected the sinus tarsi bilateral 3 mg Kenalog 5 mg Xylocaine and reappoint as needed and debrided nailbeds

## 2016-07-11 ENCOUNTER — Ambulatory Visit (HOSPITAL_COMMUNITY)
Admission: EM | Admit: 2016-07-11 | Discharge: 2016-07-11 | Disposition: A | Discharge status: Home / Self Care | Payer: Medicare Other | Attending: Family Medicine | Admitting: Family Medicine

## 2016-07-11 ENCOUNTER — Encounter (HOSPITAL_COMMUNITY): Payer: Self-pay | Admitting: Emergency Medicine

## 2016-07-11 DIAGNOSIS — S81812A Laceration without foreign body, left lower leg, initial encounter: Secondary | ICD-10-CM
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
## 2016-07-11 NOTE — Discharge Instructions (Signed)
Please return in 8 days for suture removal. You had 5 sutures that are described as horizontal mattress type.
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
## 2016-07-19 ENCOUNTER — Ambulatory Visit (HOSPITAL_COMMUNITY)
Admission: EM | Admit: 2016-07-19 | Discharge: 2016-07-19 | Disposition: A | Discharge status: Home / Self Care | Payer: Medicare Other | Attending: Emergency Medicine | Admitting: Emergency Medicine

## 2016-07-19 ENCOUNTER — Encounter (HOSPITAL_COMMUNITY): Payer: Self-pay | Admitting: Emergency Medicine

## 2016-07-19 DIAGNOSIS — Z4802 Encounter for removal of sutures: Secondary | ICD-10-CM | POA: Diagnosis not present

## 2016-07-19 NOTE — ED Triage Notes (Signed)
The patient presented to the [facility] to have 5 sutures removed from his left ankle that were placed on 07/11/2016.

## 2016-07-19 NOTE — ED Provider Notes (Signed)
CSN: [id number]     Arrival date & time 07/19/16  1538 History   First MD Initiated Contact with Patient 07/19/16 1736     Chief Complaint  Patient presents with  . Suture / Staple Removal   (Consider location/radiation/quality/duration/timing/severity/associated sxs/prior Treatment) 83 year old well-preserved man here for suture removal. He had a laceration to the posterior lower leg that had been sutured elsewhere. The patient had been applying Neosporin ointment to the wound on a daily basis. This caused some maceration and wetness to the edges and when the sutures were removed by the nurse the edges were not closed leaving approximately a 4 mm or so.      Past Medical History:  Diagnosis Date  . Abnormality of gait 06/26/2015  . Anemia    occ. injections for this at [facility]  . Arthritis    "back"  . Chronic lower back pain   . COPD (chronic obstructive pulmonary disease) ([place]) 10/19/2011   "touch"  . Dizziness and giddiness 06/26/2015  . Exertional dyspnea 10/19/2011  . Fall   . GERD (gastroesophageal reflux disease)   . Hearing loss   . History of blood transfusion    "think it was related to pacemaker placement"  . Hypertension   . Leg swelling 10/19/2011   LLE  . OSA on CPAP 10/19/2011   "sometimes wear CPAP"  . Pacemaker    Medtronic  . Prostate cancer ([place])    S/P radiation tx ~ 2008  . Skin change    12-24-14 few scalp lesions burned off"old age spots"   Past Surgical History:  Procedure Laterality Date  . BALLOON DILATION N/A 01/14/2015   Procedure: BALLOON DILATION;  Surgeon: Garlan Fair, MD;  Location: [facility];  Service: Endoscopy;  Laterality: N/A;  . CARDIAC CATHETERIZATION    . CARPAL TUNNEL RELEASE Left 11/14/2014   Procedure: CARPAL TUNNEL RELEASE LEFT THUMB;  Surgeon: Daryll Brod, MD;  Location: [place];  Service: Orthopedics;  Laterality: Left;  ANESTHESIA:  IV REGIONAL FAB  . CATARACT EXTRACTION W/ INTRAOCULAR LENS   IMPLANT, BILATERAL    . CHOLECYSTECTOMY  10/23/2011  . CHOLECYSTECTOMY  10/23/2011   Procedure: CHOLECYSTECTOMY;  Surgeon: Rolm Bookbinder, MD;  Location: [place];  Service: General;  Laterality: N/A;  with intraoperative cholangiogram  . ESOPHAGOGASTRODUODENOSCOPY (EGD) WITH PROPOFOL N/A 01/14/2015   Procedure: ESOPHAGOGASTRODUODENOSCOPY (EGD) WITH PROPOFOL;  Surgeon: Garlan Fair, MD;  [facility];  Service: Endoscopy;  Laterality: N/A;  . EYE SURGERY Bilateral    cataract  . HERNIA REPAIR  ~ 2010;    lap; VHR  . HERNIA REPAIR  ~ 9563   lap vh complicated by recurrence  . HERNIA REPAIR  09/2011   open; VHR  . I&D EXTREMITY Left 10/20/2012   Procedure: LEFT LEG IRRIGATION AND DEBRIDEMENT, AND WOUND VAC APPLICATION;  Surgeon: Marianna Payment, MD;  [facility];  Service: Orthopedics;  Laterality: Left;  . I&D EXTREMITY Left 10/22/2012   Procedure: IRRIGATION AND DEBRIDEMENT EXTREMITY;  Surgeon: Marianna Payment, MD;  [facility];  Service: Orthopedics;  Laterality: Left;  . INSERT / REPLACE / REMOVE PACEMAKER  ~ 2009   initial placement  . JOINT REPLACEMENT    . ORIF ELBOW FRACTURE Left 03/29/2015   Procedure: LEFT ELBOW OPEN REDUCTION INTERNAL FIXATION (ORIF) DISTAL HUMERUS FRACTURE WITH OLECRANON OSTEOTOMY AND ULNAR NERVE RELEASE;  Surgeon: Roseanne Kaufman, MD;  Location: [place];  Service: Orthopedics;  Laterality: Left;  . SKIN  SPLIT GRAFT Left 10/22/2012   Procedure: SKIN GRAFT SPLIT THICKNESS;  Surgeon: Marianna Payment, MD;  [facility];  Service: Orthopedics;  Laterality: Left;  . TOTAL KNEE ARTHROPLASTY  1990's   right   Family History  Problem Relation Age of Onset  . Heart disease Father   . Heart disease Mother   . Colon cancer Mother   . Stroke Mother   . Heart attack Neg Hx   . Hypertension Neg Hx    Social History  Substance Use Topics  . Smoking status: Former Smoker    Packs/day: 1.50    Years: 35.00    Types: Cigarettes    Quit date:  02/15/1973  . Smokeless tobacco: Never Used  . Alcohol use No    Review of Systems  Constitutional: Negative.   Skin: Positive for wound.       Local soreness around the  All other systems reviewed and are negative.   Allergies  Patient has no known allergies.  Home Medications   Prior to Admission medications   Medication Sig Start Date End Date Taking? Authorizing Provider  albuterol (PROVENTIL HFA;VENTOLIN HFA) 108 (90 Base) MCG/ACT inhaler Inhale 2 puffs into the lungs every 6 (six) hours as needed for wheezing or shortness of breath. 09/19/15   Rigoberto Noel, MD  aspirin EC 81 MG tablet Take 81 mg by mouth daily.    [provider]  calcium carbonate 100 mg/ml SUSP Take 600 mg by mouth.    [provider]  darbepoetin alfa-polysorbate (ARANESP, ALB FREE, SURECLICK) 093 MCG/ML injection Inject 500 mcg into the skin every 6 (six) months. Every 2 months    [provider]  furosemide (LASIX) 40 MG tablet Take 40 mg by mouth See admin instructions. Take 40mg  daily.  May take an additional 40mg  for leg swelling    [provider]  lisinopril (PRINIVIL,ZESTRIL) 20 MG tablet Take 0.5 tablets (10 mg total) by mouth daily. 02/10/16   Deboraha Sprang, MD  Multiple Vitamins-Minerals (PRESERVISION AREDS PO) Take 1 tablet by mouth 2 (two) times daily.    [provider]  mupirocin ointment (BACTROBAN) 2 % Apply 1 application topically 2 (two) times daily. 12/15/15   Newt Minion, MD  omeprazole (PRILOSEC) 20 MG capsule Take 20 mg by mouth daily.     [provider]  oxybutynin (DITROPAN XL) 15 MG 24 hr tablet Take 15 mg by mouth once a week.     [provider]  oxyCODONE (OXY IR/ROXICODONE) 5 MG immediate release tablet Take 1-2 tablets (5-10 mg total) by mouth every 3 (three) hours as needed for moderate pain. Patient taking differently: Take 5-10 mg by mouth every 6 (six) hours as needed for moderate pain.  04/01/15   Avelina Laine, PA-C  Umeclidinium-Vilanterol (ANORO ELLIPTA) 62.5-25 MCG/INH AEPB Inhale 1 puff into the lungs daily.     [provider]  vitamin B-12 (CYANOCOBALAMIN) 500 MCG tablet Take 500 mcg by mouth daily.     [provider]   Meds Ordered and Administered this Visit  Medications - No data to display  BP (!) 138/58 (BP Location: Right Arm)   Pulse 70   Temp 97.8 F (36.6 C) (Oral)   Resp 20   SpO2 99%  No data found.   Physical Exam  Constitutional: He appears well-developed and well-nourished. No distress.  Skin: Skin is warm and dry.  The wound that had been sutured gaped open after sutures  were removed. He Is approximately 4 mm in depth. No drainage. No signs of infection, no cellulitis no lymphangitis appears to be clean.  Psychiatric: He has a normal mood and affect.  Nursing note and vitals reviewed.   Urgent Care Course     Procedures (including critical care time)  Labs Review Labs Reviewed - No data to display  Imaging Review No results found.   Visual Acuity Review  Right Eye Distance:   Left Eye Distance:   Bilateral Distance:    Right Eye Near:   Left Eye Near:    Bilateral Near:         MDM   1. Visit for suture removal    All sutures removed by nurse. The gaping wound was closed with one, one half inch Steri-Strip in the middle. This allowed the edges to the sides to approximate loosely. He is advised to replace the suture if he gets wet comes off. He also is aware of signs and symptoms for infection and may return for any problems. Keep a single Steri-Strip across the middle of the wound as applied here. Try to keep it dry. Watch for any signs of infection. For any problems return promptly.     Janne Napoleon, NP 07/19/16 1747

## 2016-07-19 NOTE — ED Notes (Signed)
Sutures removed, edged appeared together, patted wound with gauze and edges not intact.  No bleeding.,  Notified david mabe, np.

## 2016-07-19 NOTE — Discharge Instructions (Signed)
Keep a single Steri-Strip across the middle of the wound as applied here. Try to keep it dry. Watch for any signs of infection. For any problems return promptly.

## 2016-07-22 ENCOUNTER — Ambulatory Visit (INDEPENDENT_AMBULATORY_CARE_PROVIDER_SITE_OTHER): Payer: Medicare Other | Admitting: Physician Assistant

## 2016-07-22 VITALS — BP 108/58 | HR 63 | Ht 70.0 in | Wt 177.0 lb

## 2016-07-22 DIAGNOSIS — I4891 Unspecified atrial fibrillation: Secondary | ICD-10-CM | POA: Diagnosis not present

## 2016-07-22 DIAGNOSIS — I480 Paroxysmal atrial fibrillation: Secondary | ICD-10-CM

## 2016-07-22 DIAGNOSIS — I10 Essential (primary) hypertension: Secondary | ICD-10-CM

## 2016-07-22 DIAGNOSIS — Z950 Presence of cardiac pacemaker: Secondary | ICD-10-CM

## 2016-07-22 LAB — CUP PACEART INCLINIC DEVICE CHECK
Battery Impedance: 1294 Ohm
Battery Voltage: 2.75 V
Brady Statistic AP VP Percent: 4 %
Brady Statistic AP VS Percent: 73 %
Brady Statistic AS VP Percent: 1 %
Date Time Interrogation Session: 20180607163149
Implantable Lead Implant Date: 20080826
Implantable Lead Model: 5076
Lead Channel Impedance Value: 439 Ohm
Lead Channel Impedance Value: 634 Ohm
Lead Channel Pacing Threshold Amplitude: 0.75 V
Lead Channel Pacing Threshold Amplitude: 1 V
Lead Channel Pacing Threshold Pulse Width: 0.4 ms
Lead Channel Sensing Intrinsic Amplitude: 2.8 mV
Lead Channel Sensing Intrinsic Amplitude: 4 mV
Lead Channel Setting Sensing Sensitivity: 2 mV
MDC IDC LEAD IMPLANT DT: 20080826
MDC IDC LEAD LOCATION: 753859
MDC IDC LEAD LOCATION: 753860
MDC IDC MSMT BATTERY REMAINING LONGEVITY: 38 mo
MDC IDC MSMT LEADCHNL RV PACING THRESHOLD PULSEWIDTH: 0.4 ms
MDC IDC PG IMPLANT DT: 20080826
MDC IDC SET LEADCHNL RA PACING AMPLITUDE: 1.75 V
MDC IDC SET LEADCHNL RV PACING AMPLITUDE: 2.5 V
MDC IDC SET LEADCHNL RV PACING PULSEWIDTH: 0.4 ms
MDC IDC STAT BRADY AS VS PERCENT: 21 %

## 2016-07-22 NOTE — Patient Instructions (Addendum)
                                                                                                                                             Medication Instructions:   Your physician recommends that you continue on your current medications as directed. Please refer to the Current Medication list given to you today.   If you need a refill on your cardiac medications before your next appointment, please call your pharmacy.  Labwork: NONE ORDERED  TODAY    Testing/Procedures: NONE ORDERED  TODAY   Follow-Up:  Your physician wants you to follow-up in: [place] will receive a reminder letter in the mail two months in advance. If you don't receive a letter, please call our office to schedule the follow-up appointment.   Remote monitoring is used to monitor your Pacemaker of ICD from home. This monitoring reduces the number of office visits required to check your device to one time per year. It allows [place] to keep an eye on the functioning of your device to ensure it is working properly. You are scheduled for a device check from home on .  10-25-2016 You may send your transmission at any time that day. If you have a wireless device, the transmission will be sent automatically. After your physician reviews your transmission, you will receive a postcard with your next transmission date.

## 2016-07-22 NOTE — Progress Notes (Signed)
Cardiology Office Note Date:  07/22/2016  Patient ID:  [name] [name], Yu Sep 25, 1924, MRN [id number] PCP:  [name] Shanks, MD  Cardiologist:  Dr. Irish Lack Electrophysiologist: Dr. Caryl Comes   Chief Complaint: routine device visit  History of Present Illness: [name] [name] is a 83 y.o. male with history of AF in review of cardiology notes, not felt to be a good a/c candidate, record also notes patient declined, HTN, HLD, OSA w/CPAP, falls,  Sinus node dysfunction w/PPM.  He comes in today to be seen for dr. Caryl Comes, last seen by him in June 2017, at that time noted to have PVC burden of 6% by device, suspected to be higher but felt low BP was culprit of dizziness at that time and ACE was reduced.  He saw Dr. Irish Lack in April noting his BP better felt his physical limitations were multifactorial, no medication changes were made, but advised on use of an additional diuretic pill for edema when needed.  He tells me he is doing well.  He has a terrible balance problem so is not very active up and about but rides a recumbent bike for exercise routinely.  He feels like his exertional capacity is at his baseline, has some degree of chronic DOE he feels is unchanged for years.  No symptoms of PND or orthopnea, no rest SOB, no CP, no palpitations.  He has hx of falls bu no near syncope or syncope.  He does not weigh himself but takes an extra lasix pill 1-2 x week to keep his LE swelling controlled, this is also his baseline.    Device information: MDT dual chamber PPM, implanted 10/11/06, sinus node dysfunction, Dr. Caryl Comes   Past Medical History:  Diagnosis Date  . Abnormality of gait 06/26/2015  . Anemia    occ. injections for this at [facility]  . Arthritis    "back"  . Chronic lower back pain   . COPD (chronic obstructive pulmonary disease) ([facility]) 10/19/2011   "touch"  . Dizziness and giddiness 06/26/2015  . Exertional dyspnea 10/19/2011  . Fall   . GERD (gastroesophageal reflux disease)    . Hearing loss   . History of blood transfusion    "think it was related to pacemaker placement"  . Hypertension   . Leg swelling 10/19/2011   LLE  . OSA on CPAP 10/19/2011   "sometimes wear CPAP"  . Pacemaker    Medtronic  . Prostate cancer ([employer])    S/P radiation tx ~ 2008  . Skin change    12-24-14 few scalp lesions burned off"old age spots"    Past Surgical History:  Procedure Laterality Date  . BALLOON DILATION N/A 01/14/2015   Procedure: BALLOON DILATION;  Surgeon: Garlan Fair, MD;  Location: [facility];  Service: Endoscopy;  Laterality: N/A;  . CARDIAC CATHETERIZATION    . CARPAL TUNNEL RELEASE Left 11/14/2014   Procedure: CARPAL TUNNEL RELEASE LEFT THUMB;  Surgeon: Daryll Brod, MD;  Location: [facility];  Service: Orthopedics;  Laterality: Left;  ANESTHESIA:  IV REGIONAL FAB  . CATARACT EXTRACTION W/ INTRAOCULAR LENS  IMPLANT, BILATERAL    . CHOLECYSTECTOMY  10/23/2011  . CHOLECYSTECTOMY  10/23/2011   Procedure: CHOLECYSTECTOMY;  Surgeon: Rolm Bookbinder, MD;  Location: [facility];  Service: General;  Laterality: N/A;  with intraoperative cholangiogram  . ESOPHAGOGASTRODUODENOSCOPY (EGD) WITH PROPOFOL N/A 01/14/2015   Procedure: ESOPHAGOGASTRODUODENOSCOPY (EGD) WITH PROPOFOL;  Surgeon: Garlan Fair, MD;  [facility];  Service: Endoscopy;  Laterality: N/A;  . EYE SURGERY Bilateral    cataract  . HERNIA REPAIR  ~ 2010;    lap; VHR  . HERNIA REPAIR  ~ 6213   lap vh complicated by recurrence  . HERNIA REPAIR  09/2011   open; VHR  . I&D EXTREMITY Left 10/20/2012   Procedure: LEFT LEG IRRIGATION AND DEBRIDEMENT, AND WOUND VAC APPLICATION;  Surgeon: Marianna Payment, MD;  [facility];  Service: Orthopedics;  Laterality: Left;  . I&D EXTREMITY Left 10/22/2012   Procedure: IRRIGATION AND DEBRIDEMENT EXTREMITY;  Surgeon: Marianna Payment, MD;  [facility];  Service: Orthopedics;  Laterality: Left;  . INSERT / REPLACE / REMOVE PACEMAKER  ~ 2009     initial placement  . JOINT REPLACEMENT    . ORIF ELBOW FRACTURE Left 03/29/2015   Procedure: LEFT ELBOW OPEN REDUCTION INTERNAL FIXATION (ORIF) DISTAL HUMERUS FRACTURE WITH OLECRANON OSTEOTOMY AND ULNAR NERVE RELEASE;  Surgeon: Roseanne Kaufman, MD;  Location: [facility];  Service: Orthopedics;  Laterality: Left;  . SKIN SPLIT GRAFT Left 10/22/2012   Procedure: SKIN GRAFT SPLIT THICKNESS;  Surgeon: Marianna Payment, MD;  [facility];  Service: Orthopedics;  Laterality: Left;  . TOTAL KNEE ARTHROPLASTY  1990's   right    Current Outpatient Prescriptions  Medication Sig Dispense Refill  . albuterol (PROVENTIL HFA;VENTOLIN HFA) 108 (90 Base) MCG/ACT inhaler Inhale 2 puffs into the lungs every 6 (six) hours as needed for wheezing or shortness of breath. 1 Inhaler 6  . aspirin EC 81 MG tablet Take 81 mg by mouth daily.    . calcium carbonate 100 mg/ml SUSP Take 600 mg by mouth.    . darbepoetin alfa-polysorbate (ARANESP, ALB FREE, SURECLICK) 086 MCG/ML injection Inject 500 mcg into the skin every 6 (six) months. Every 2 months    . furosemide (LASIX) 40 MG tablet Take 40 mg by mouth See admin instructions. Take 40mg  daily.  May take an additional 40mg  for leg swelling    . lisinopril (PRINIVIL,ZESTRIL) 20 MG tablet Take 0.5 tablets (10 mg total) by mouth daily. 45 tablet 3  . Multiple Vitamins-Minerals (PRESERVISION AREDS PO) Take 1 tablet by mouth 2 (two) times daily.    . mupirocin ointment (BACTROBAN) 2 % Apply 1 application topically 2 (two) times daily. 22 g 6  . omeprazole (PRILOSEC) 20 MG capsule Take 20 mg by mouth daily.     Marland Kitchen oxybutynin (DITROPAN XL) 15 MG 24 hr tablet Take 15 mg by mouth once a week.     Marland Kitchen oxyCODONE (OXY IR/ROXICODONE) 5 MG immediate release tablet Take 1-2 tablets (5-10 mg total) by mouth every 3 (three) hours as needed for moderate pain. (Patient taking differently: Take 5-10 mg by mouth every 6 (six) hours as needed for moderate pain. ) 40 tablet 0  .  Umeclidinium-Vilanterol (ANORO ELLIPTA) 62.5-25 MCG/INH AEPB Inhale 1 puff into the lungs daily.     . vitamin B-12 (CYANOCOBALAMIN) 500 MCG tablet Take 500 mcg by mouth daily.      No current facility-administered medications for this visit.     Allergies:   Patient has no known allergies.   Social History:  The patient  reports that he quit smoking about 43 years ago. His smoking use included Cigarettes. He has a 52.50 pack-year smoking history. He has never used smokeless tobacco. He reports that he does not drink alcohol or use drugs.   Family History:  The patient's family history includes Colon cancer in his mother;  Heart disease in his father and mother; Stroke in his mother.  ROS:  Please see the history of present illness.  All other systems are reviewed and otherwise negative.   PHYSICAL EXAM:  VS:  BP (!) 108/58   Pulse 63   Ht 5\' 10"  (1.778 m)   Wt 177 lb (80.3 kg)   BMI 25.40 kg/m  BMI: Body mass index is 25.4 kg/m. Well nourished, well developed, in no acute distress  HEENT: normocephalic, atraumatic  Neck: no JVD, carotid bruits or masses Cardiac: RRR; 1/6 SM, no rubs, or gallops Lungs:  CTA b/l, no wheezing, rhonchi or rales  Abd: soft, nontender MS: no deformity or atrophy Ext: 1+ edema to midshin, L lower leg has an ace wrap (s/p suture removal Monday), chronic looking skin changes Skin: warm and dry, no rash Neuro:  No gross deficits appreciated Psych: euthymic mood, full affect  PPM site is stable, no tethering or discomfort   EKG:  Done 06/14/16 A pace/V sensed PPM interrogations done today and reviewed by myself: battery and lead measurements are good, H v rates are noted, available EGMs are ATach, 6 mode switches  06/12/12; TTE LVEF 50-55% Mild LAE, mild MR  Recent Labs: 08/06/2015: Magnesium 1.8 05/18/2016: ALT 10; BUN 36.4; Creatinine 1.4; HGB 11.0; Platelets 117; Potassium 4.1; Sodium 143  No results found for requested labs within last 8760 hours.    CrCl cannot be calculated (Patient's most recent lab result is older than the maximum 21 days allowed.).   Wt Readings from Last 3 Encounters:  07/22/16 177 lb (80.3 kg)  06/16/16 179 lb (81.2 kg)  06/14/16 179 lb (81.2 kg)     Other studies reviewed: Additional studies/records reviewed today include: summarized above   ASSESSMENT AND PLAN:  1. PPM     Intact function  2. Paroxysmal Afib     CHA2DS2Vasc is at least 3, not felt to be an a/c candidate  3. HTN     Relative hypotension     108/58, 11//60 today     No changes   Disposition: F/u with Q 3 month remote pacer check, Dr. Caryl Comes in 6 months, sooner if needed.   Current medicines are reviewed at length with the patient today.  The patient did not have any concerns regarding medicines.     Haywood Lasso, PA-C 07/22/2016 3:32 PM     [address] [place] [address] [phone] (office)  [phone] (fax)

## 2016-08-03 ENCOUNTER — Encounter: Payer: Medicare Other | Admitting: Physician Assistant

## 2016-08-11 ENCOUNTER — Ambulatory Visit (INDEPENDENT_AMBULATORY_CARE_PROVIDER_SITE_OTHER): Payer: Medicare Other | Admitting: *Deleted

## 2016-08-11 ENCOUNTER — Telehealth: Payer: Self-pay | Admitting: Cardiology

## 2016-08-11 DIAGNOSIS — I495 Sick sinus syndrome: Secondary | ICD-10-CM

## 2016-08-11 NOTE — Telephone Encounter (Signed)
Spoke with pt and reminded pt of remote transmission that is due today. Pt verbalized understanding.   

## 2016-08-12 ENCOUNTER — Encounter: Payer: Self-pay | Admitting: Cardiology

## 2016-08-12 NOTE — Progress Notes (Signed)
Remote pacemaker transmission.   

## 2016-08-23 DIAGNOSIS — G629 Polyneuropathy, unspecified: Secondary | ICD-10-CM | POA: Diagnosis not present

## 2016-08-23 DIAGNOSIS — R2689 Other abnormalities of gait and mobility: Secondary | ICD-10-CM | POA: Diagnosis not present

## 2016-08-23 DIAGNOSIS — H3530 Unspecified macular degeneration: Secondary | ICD-10-CM | POA: Diagnosis not present

## 2016-08-23 DIAGNOSIS — R269 Unspecified abnormalities of gait and mobility: Secondary | ICD-10-CM | POA: Diagnosis not present

## 2016-08-23 DIAGNOSIS — M48061 Spinal stenosis, lumbar region without neurogenic claudication: Secondary | ICD-10-CM | POA: Diagnosis not present

## 2016-09-07 LAB — CUP PACEART REMOTE DEVICE CHECK
Battery Remaining Longevity: 37 mo
Battery Voltage: 2.74 V
Brady Statistic AP VP Percent: 2 %
Brady Statistic AS VP Percent: 1 %
Implantable Lead Implant Date: 20080826
Implantable Lead Location: 753859
Implantable Lead Model: 5076
Implantable Lead Model: 5076
Implantable Pulse Generator Implant Date: 20080826
Lead Channel Impedance Value: 428 Ohm
Lead Channel Pacing Threshold Amplitude: 0.75 V
Lead Channel Setting Pacing Amplitude: 1.625
Lead Channel Setting Pacing Amplitude: 2.5 V
Lead Channel Setting Pacing Pulse Width: 0.4 ms
Lead Channel Setting Sensing Sensitivity: 2 mV
MDC IDC LEAD IMPLANT DT: 20080826
MDC IDC LEAD LOCATION: 753860
MDC IDC MSMT BATTERY IMPEDANCE: 1323 Ohm
MDC IDC MSMT LEADCHNL RA PACING THRESHOLD PULSEWIDTH: 0.4 ms
MDC IDC MSMT LEADCHNL RV IMPEDANCE VALUE: 563 Ohm
MDC IDC MSMT LEADCHNL RV PACING THRESHOLD AMPLITUDE: 1.125 V
MDC IDC MSMT LEADCHNL RV PACING THRESHOLD PULSEWIDTH: 0.4 ms
MDC IDC SESS DTM: 20180627164305
MDC IDC STAT BRADY AP VS PERCENT: 73 %
MDC IDC STAT BRADY AS VS PERCENT: 24 %

## 2016-09-30 DIAGNOSIS — H353211 Exudative age-related macular degeneration, right eye, with active choroidal neovascularization: Secondary | ICD-10-CM | POA: Insufficient documentation

## 2016-09-30 DIAGNOSIS — Z961 Presence of intraocular lens: Secondary | ICD-10-CM | POA: Insufficient documentation

## 2016-10-06 DIAGNOSIS — G894 Chronic pain syndrome: Secondary | ICD-10-CM | POA: Diagnosis not present

## 2016-10-06 DIAGNOSIS — M48061 Spinal stenosis, lumbar region without neurogenic claudication: Secondary | ICD-10-CM | POA: Diagnosis not present

## 2016-10-06 DIAGNOSIS — M546 Pain in thoracic spine: Secondary | ICD-10-CM | POA: Diagnosis not present

## 2016-10-06 DIAGNOSIS — Z79891 Long term (current) use of opiate analgesic: Secondary | ICD-10-CM | POA: Diagnosis not present

## 2016-10-25 DIAGNOSIS — R42 Dizziness and giddiness: Secondary | ICD-10-CM | POA: Diagnosis not present

## 2016-10-25 DIAGNOSIS — Z87891 Personal history of nicotine dependence: Secondary | ICD-10-CM | POA: Diagnosis not present

## 2016-10-25 DIAGNOSIS — G6289 Other specified polyneuropathies: Secondary | ICD-10-CM | POA: Diagnosis not present

## 2016-10-25 DIAGNOSIS — I129 Hypertensive chronic kidney disease with stage 1 through stage 4 chronic kidney disease, or unspecified chronic kidney disease: Secondary | ICD-10-CM | POA: Diagnosis not present

## 2016-10-26 DIAGNOSIS — J342 Deviated nasal septum: Secondary | ICD-10-CM | POA: Diagnosis not present

## 2016-10-26 DIAGNOSIS — J300 Vasomotor rhinitis: Secondary | ICD-10-CM | POA: Diagnosis not present

## 2016-10-28 DIAGNOSIS — R159 Full incontinence of feces: Secondary | ICD-10-CM | POA: Diagnosis not present

## 2016-10-28 DIAGNOSIS — Z8719 Personal history of other diseases of the digestive system: Secondary | ICD-10-CM | POA: Diagnosis not present

## 2016-10-29 DIAGNOSIS — G629 Polyneuropathy, unspecified: Secondary | ICD-10-CM | POA: Diagnosis not present

## 2016-11-01 ENCOUNTER — Encounter: Payer: Self-pay | Admitting: Pulmonary Disease

## 2016-11-01 ENCOUNTER — Ambulatory Visit (INDEPENDENT_AMBULATORY_CARE_PROVIDER_SITE_OTHER): Payer: Medicare Other | Admitting: Pulmonary Disease

## 2016-11-01 DIAGNOSIS — I10 Essential (primary) hypertension: Secondary | ICD-10-CM

## 2016-11-01 DIAGNOSIS — G4733 Obstructive sleep apnea (adult) (pediatric): Secondary | ICD-10-CM

## 2016-11-01 DIAGNOSIS — J432 Centrilobular emphysema: Secondary | ICD-10-CM

## 2016-11-01 NOTE — Patient Instructions (Signed)
Stay on ANORO once daily Use albuterol as needed for shortness of breath or wheezing Call us if you get a bad chest cold  Obtain CPAP download from [facility] to check settings  Check with your PCP-maybe can stop taking lisinopril altogether

## 2016-11-01 NOTE — Assessment & Plan Note (Signed)
Obtain CPAP download from [facility] to check settings  compliance with goal of at least 4-6 hrs every night is the expectation. Advised against medications with sedative side effects Cautioned against driving when sleepy - understanding that sleepiness will vary on a day to day basis

## 2016-11-01 NOTE — Assessment & Plan Note (Signed)
Check with your PCP-maybe can stop taking lisinopril altogether

## 2016-11-01 NOTE — Assessment & Plan Note (Addendum)
Stay on ANORO once daily Use albuterol as needed for shortness of breath or wheezing Call us if you get a bad chest cold  I offered him pulmonary rehabilitation but he is really not interested and feels that due to his back. He could not participate

## 2016-11-01 NOTE — Progress Notes (Signed)
   Subjective:    Patient ID: [name] [name], male    DOB: 1924-08-24, 83 y.o.   MRN: [id number]  HPI  83 year-old for follow-up of COPD and OSA,former [facility] patient He smoked until the age of 20 about 30-pack-years.   He was placed on Anoro due to declining lung function, he takes this religiously but is not sure that this is helping him. His wife has reported that he has a sedentary lifestyle He continues to complain of shortness of breath and fatigue. He has not really use albuterol 2 often  His concern about his low blood pressure, medication list shows 10 mg of lisinopril which she's been on for many years, I note that he has seen neurologists for dizziness and no clear etiology has been identified. He is also on Lasix for pedal edema as needed  He also has cardiomyopathy with a pacemaker    Significant tests/ events  [facility] 2015:  FEV1 1.92 (63%), ratio 55  Review of Systems neg for any significant sore throat, dysphagia, itching, sneezing, nasal congestion or excess/ purulent secretions, fever, chills, sweats, unintended wt loss, pleuritic or exertional cp, hempoptysis, orthopnea pnd or change in chronic leg swelling. Also denies presyncope, palpitations, heartburn, abdominal pain, nausea, vomiting, diarrhea or change in bowel or urinary habits, dysuria,hematuria, rash, arthralgias, visual complaints, headache, numbness weakness or ataxia.     Objective:   Physical Exam  Gen. Pleasant, well-nourished, in no distress ENT - no thrush, no post nasal drip Neck: No JVD, no thyromegaly, no carotid bruits Lungs: no use of accessory muscles, no dullness to percussion, clear without rales or rhonchi  Cardiovascular: Rhythm regular, heart sounds  normal, no murmurs or gallops, no peripheral edema Musculoskeletal: No deformities, no cyanosis or clubbing , ambulates with cane       Assessment & Plan:

## 2016-11-03 ENCOUNTER — Ambulatory Visit (INDEPENDENT_AMBULATORY_CARE_PROVIDER_SITE_OTHER): Payer: Medicare Other

## 2016-11-03 ENCOUNTER — Encounter (INDEPENDENT_AMBULATORY_CARE_PROVIDER_SITE_OTHER): Payer: Self-pay | Admitting: Orthopedic Surgery

## 2016-11-03 ENCOUNTER — Ambulatory Visit (INDEPENDENT_AMBULATORY_CARE_PROVIDER_SITE_OTHER): Payer: Medicare Other | Admitting: Orthopedic Surgery

## 2016-11-03 DIAGNOSIS — M545 Low back pain: Secondary | ICD-10-CM | POA: Diagnosis not present

## 2016-11-03 MED ORDER — PREDNISONE 10 MG PO TABS: 10.0000 mg | ORAL_TABLET | Freq: Every day | ORAL | 0 refills | Status: DC

## 2016-11-03 NOTE — Progress Notes (Signed)
Office Visit Note   Patient: [name] [name]           Date of Birth: 1924/12/13           MRN: [id number] Visit Date: 11/03/2016              Requested by: [name], MD [place], [place] [address] PCP: [name], MD  Chief Complaint  Patient presents with  . Middle Back - Pain      HPI: Patient is a 83 year old gentleman who presents complaining of some left paraspinous muscle pain and midthoracic spine. Patient states he's had pain for about a week he states the pain is worse with sitting and with driving. He has tried Percocet without relief. Has not tried nonsteroidals.  Assessment & Plan: Visit Diagnoses:  1. Midline low back pain, unspecified chronicity, with sciatica presence unspecified     Plan: Patient will try Aleve 2 by mouth twice a day if this is not enough a prescription was called in for prednisone he will take 10 mg every morning and wean off once the symptoms resolved. Also recommended using heat on his back.  Follow-Up Instructions: Return if symptoms worsen or fail to improve.   Ortho Exam  Patient is alert, oriented, no adenopathy, well-dressed, normal affect, normal respiratory effort. Examination patient ambulates with a kyphotic gait with a cane. He has a negative straight leg raise bilaterally. There is no tenderness to palpation of the spinous process no clinical signs of acute stress fracture. The paraspinous muscles are minimally tender to palpation worse on the left than the right. There is a negative straight leg raise bilaterally with no focal motor weakness in either lower extremity.  Imaging: Xr Thoracic Spine 2 View  Result Date: 11/03/2016 2 view radiographs of the thoracic spine shows some osteophytic bone spurs distally. There is a kyphotic deformity without any acute compression fractures.  No images are attached to the encounter.  Labs: Lab Results  Component Value Date   CRP 16.2 (H) 10/20/2011   LABURIC 4.7 10/17/2006   REPTSTATUS 03/31/2015 FINAL 03/30/2015   GRAMSTAIN Abundant 12/18/2015   GRAMSTAIN WBC present-predominately PMN 12/18/2015   GRAMSTAIN No Squamous Epithelial Cells Seen 12/18/2015   GRAMSTAIN No Organisms Seen 12/18/2015   CULT 9,000 COLONIES/mL INSIGNIFICANT GROWTH 03/30/2015   LABORGA NORMAL SKIN FLORA 12/18/2015    Orders:  Orders Placed This Encounter  Procedures  . XR Thoracic Spine 2 View   No orders of the defined types were placed in this encounter.    Procedures: No procedures performed  Clinical Data: No additional findings.  ROS:  All other systems negative, except as noted in the HPI. Review of Systems  Objective: Vital Signs: There were no vitals taken for this visit.  Specialty Comments:  No specialty comments available.  PMFS History: Patient Active Problem List   Diagnosis Date Noted  . Impingement syndrome of right shoulder 06/16/2016  . Impingement syndrome of left shoulder 06/16/2016  . Bilateral leg edema 06/14/2016  . Dizziness and giddiness 06/26/2015  . Abnormality of gait 06/26/2015  . Anxiety 04/14/2015  . GERD (gastroesophageal reflux disease) 04/03/2015  . OSA (obstructive sleep apnea) 04/03/2015  . HLD (hyperlipidemia) 04/03/2015  . SOB (shortness of breath)   . Left elbow fracture 03/29/2015  . Dizziness 01/01/2015  . LBP (low back pain) 01/01/2015  . Malignant neoplasm of prostate ([facility]) 01/01/2015  . BP (high blood pressure) 01/01/2015  . DOE (dyspnea on  exertion) 10/26/2013  . Essential hypertension, benign 10/26/2013  . Post-traumatic wound infection 10/17/2012  . Cellulitis 10/17/2012  . Atrial fibrillation ([facility]) 10/22/2011  . DVT, lower extremity, distal, chronic ([facility]) 10/22/2011  . Pacemaker 10/19/2011  . Prostate cancer ([facility]) 10/19/2011  . COPD (chronic obstructive pulmonary disease) with emphysema ([facility]) 02/04/2011  . Anemia 12/10/2010   Past Medical History:  Diagnosis Date  . Abnormality  of gait 06/26/2015  . Anemia    occ. injections for this at [facility]  . Arthritis    "back"  . Chronic lower back pain   . COPD (chronic obstructive pulmonary disease) ([facility]) 10/19/2011   "touch"  . Dizziness and giddiness 06/26/2015  . Exertional dyspnea 10/19/2011  . Fall   . GERD (gastroesophageal reflux disease)   . Hearing loss   . History of blood transfusion    "think it was related to pacemaker placement"  . Hypertension   . Leg swelling 10/19/2011   LLE  . OSA on CPAP 10/19/2011   "sometimes wear CPAP"  . Pacemaker    Medtronic  . Prostate cancer ([facility])    S/P radiation tx ~ 2008  . Skin change    12-24-14 few scalp lesions burned off"old age spots"    Family History  Problem Relation Age of Onset  . Heart disease Father   . Heart disease Mother   . Colon cancer Mother   . Stroke Mother   . Heart attack Neg Hx   . Hypertension Neg Hx     Past Surgical History:  Procedure Laterality Date  . BALLOON DILATION N/A 01/14/2015   Procedure: BALLOON DILATION;  Surgeon: Garlan Fair, MD;  Location: [facility];  Service: Endoscopy;  Laterality: N/A;  . CARDIAC CATHETERIZATION    . CARPAL TUNNEL RELEASE Left 11/14/2014   Procedure: CARPAL TUNNEL RELEASE LEFT THUMB;  Surgeon: Daryll Brod, MD;  Location: [facility];  Service: Orthopedics;  Laterality: Left;  ANESTHESIA:  IV REGIONAL FAB  . CATARACT EXTRACTION W/ INTRAOCULAR LENS  IMPLANT, BILATERAL    . CHOLECYSTECTOMY  10/23/2011  . CHOLECYSTECTOMY  10/23/2011   Procedure: CHOLECYSTECTOMY;  Surgeon: Rolm Bookbinder, MD;  Location: [facility];  Service: General;  Laterality: N/A;  with intraoperative cholangiogram  . ESOPHAGOGASTRODUODENOSCOPY (EGD) WITH PROPOFOL N/A 01/14/2015   Procedure: ESOPHAGOGASTRODUODENOSCOPY (EGD) WITH PROPOFOL;  Surgeon: Garlan Fair, MD;  [facility];  Service: Endoscopy;  Laterality: N/A;  . EYE SURGERY Bilateral    cataract  . HERNIA REPAIR  ~ 2010;    lap; VHR  .  HERNIA REPAIR  ~ 3810   lap vh complicated by recurrence  . HERNIA REPAIR  09/2011   open; VHR  . I&D EXTREMITY Left 10/20/2012   Procedure: LEFT LEG IRRIGATION AND DEBRIDEMENT, AND WOUND VAC APPLICATION;  Surgeon: Marianna Payment, MD;  [facility];  Service: Orthopedics;  Laterality: Left;  . I&D EXTREMITY Left 10/22/2012   Procedure: IRRIGATION AND DEBRIDEMENT EXTREMITY;  Surgeon: Marianna Payment, MD;  [facility];  Service: Orthopedics;  Laterality: Left;  . INSERT / REPLACE / REMOVE PACEMAKER  ~ 2009   initial placement  . JOINT REPLACEMENT    . ORIF ELBOW FRACTURE Left 03/29/2015   Procedure: LEFT ELBOW OPEN REDUCTION INTERNAL FIXATION (ORIF) DISTAL HUMERUS FRACTURE WITH OLECRANON OSTEOTOMY AND ULNAR NERVE RELEASE;  Surgeon: Roseanne Kaufman, MD;  Location: [facility];  Service: Orthopedics;  Laterality: Left;  . SKIN SPLIT GRAFT Left 10/22/2012   Procedure:  SKIN GRAFT SPLIT THICKNESS;  Surgeon: Marianna Payment, MD;  [facility];  Service: Orthopedics;  Laterality: Left;  . TOTAL KNEE ARTHROPLASTY  1990's   right   Social History   Occupational History  . 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 Smoking status: Former Smoker    Packs/day: 1.50    Years: 35.00    Types: Cigarettes    Quit date: 02/15/1973  . Smokeless tobacco: Never Used  . Alcohol use No  . Drug use: No  . Sexual activity: No

## 2016-11-10 ENCOUNTER — Telehealth: Payer: Self-pay | Admitting: Cardiology

## 2016-11-10 ENCOUNTER — Ambulatory Visit (INDEPENDENT_AMBULATORY_CARE_PROVIDER_SITE_OTHER): Payer: Medicare Other | Admitting: *Deleted

## 2016-11-10 DIAGNOSIS — I495 Sick sinus syndrome: Secondary | ICD-10-CM | POA: Diagnosis not present

## 2016-11-10 DIAGNOSIS — J300 Vasomotor rhinitis: Secondary | ICD-10-CM | POA: Diagnosis not present

## 2016-11-10 NOTE — Telephone Encounter (Signed)
Confirmed remote transmission w/ pt wife.   

## 2016-11-11 ENCOUNTER — Encounter: Payer: Self-pay | Admitting: Cardiology

## 2016-11-11 LAB — CUP PACEART REMOTE DEVICE CHECK
Battery Remaining Longevity: 33 mo
Battery Voltage: 2.74 V
Brady Statistic AP VP Percent: 2 %
Brady Statistic AP VS Percent: 75 %
Brady Statistic AS VP Percent: 1 %
Implantable Lead Location: 753860
Implantable Lead Model: 5076
Implantable Pulse Generator Implant Date: 20080826
Lead Channel Impedance Value: 417 Ohm
Lead Channel Pacing Threshold Amplitude: 0.75 V
Lead Channel Pacing Threshold Amplitude: 1.125 V
Lead Channel Pacing Threshold Pulse Width: 0.4 ms
Lead Channel Pacing Threshold Pulse Width: 0.4 ms
Lead Channel Setting Pacing Amplitude: 1.5 V
MDC IDC LEAD IMPLANT DT: 20080826
MDC IDC LEAD IMPLANT DT: 20080826
MDC IDC LEAD LOCATION: 753859
MDC IDC MSMT BATTERY IMPEDANCE: 1518 Ohm
MDC IDC MSMT LEADCHNL RV IMPEDANCE VALUE: 546 Ohm
MDC IDC SESS DTM: 20180926192907
MDC IDC SET LEADCHNL RV PACING AMPLITUDE: 2.5 V
MDC IDC SET LEADCHNL RV PACING PULSEWIDTH: 0.4 ms
MDC IDC SET LEADCHNL RV SENSING SENSITIVITY: 2 mV
MDC IDC STAT BRADY AS VS PERCENT: 23 %

## 2016-11-11 NOTE — Progress Notes (Signed)
Remote pacemaker transmission.   

## 2016-11-16 ENCOUNTER — Ambulatory Visit (HOSPITAL_BASED_OUTPATIENT_CLINIC_OR_DEPARTMENT_OTHER): Payer: Medicare Other | Admitting: Oncology

## 2016-11-16 ENCOUNTER — Other Ambulatory Visit (HOSPITAL_BASED_OUTPATIENT_CLINIC_OR_DEPARTMENT_OTHER): Payer: Medicare Other

## 2016-11-16 ENCOUNTER — Ambulatory Visit: Payer: Self-pay

## 2016-11-16 ENCOUNTER — Telehealth: Payer: Self-pay | Admitting: Oncology

## 2016-11-16 VITALS — BP 127/53 | HR 87 | Temp 98.4°F | Resp 18 | Ht 70.0 in | Wt 171.6 lb

## 2016-11-16 DIAGNOSIS — D500 Iron deficiency anemia secondary to blood loss (chronic): Secondary | ICD-10-CM

## 2016-11-16 DIAGNOSIS — D649 Anemia, unspecified: Secondary | ICD-10-CM

## 2016-11-16 DIAGNOSIS — N289 Disorder of kidney and ureter, unspecified: Secondary | ICD-10-CM | POA: Diagnosis not present

## 2016-11-16 DIAGNOSIS — Z8546 Personal history of malignant neoplasm of prostate: Secondary | ICD-10-CM | POA: Diagnosis not present

## 2016-11-16 LAB — CBC WITH DIFFERENTIAL/PLATELET
BASO%: 0.3 % (ref 0.0–2.0)
Basophils Absolute: 0 10*3/uL (ref 0.0–0.1)
EOS%: 2.6 % (ref 0.0–7.0)
Eosinophils Absolute: 0.2 10*3/uL (ref 0.0–0.5)
HCT: 32.5 % — ABNORMAL LOW (ref 38.4–49.9)
HGB: 10.3 g/dL — ABNORMAL LOW (ref 13.0–17.1)
LYMPH#: 1.4 10*3/uL (ref 0.9–3.3)
LYMPH%: 23.8 % (ref 14.0–49.0)
MCH: 30.3 pg (ref 27.2–33.4)
MCHC: 31.7 g/dL — ABNORMAL LOW (ref 32.0–36.0)
MCV: 95.6 fL (ref 79.3–98.0)
MONO#: 0.3 10*3/uL (ref 0.1–0.9)
MONO%: 4.7 % (ref 0.0–14.0)
NEUT%: 68.6 % (ref 39.0–75.0)
NEUTROS ABS: 3.9 10*3/uL (ref 1.5–6.5)
PLATELETS: 127 10*3/uL — AB (ref 140–400)
RBC: 3.4 10*6/uL — ABNORMAL LOW (ref 4.20–5.82)
RDW: 13.7 % (ref 11.0–14.6)
WBC: 5.7 10*3/uL (ref 4.0–10.3)

## 2016-11-16 NOTE — Progress Notes (Signed)
Hematology and Oncology Follow Up Visit  [name] [name] [id number] 1924/04/07 83 y.o. 11/16/2016 11:39 AM  CC: Hal T. Felipa Eth, M.D.  Thana Farr. Karsten Ro, M.D.    Principle Diagnosis: 83 year old gentleman with the following diagnoses.  1. Multifactorial anemia.  He has anemia of chronic disease.  It may be anemia of renal insufficiency. 2. History of prostate cancer status post radiation therapy completed in 2008.  Current therapy: He is on Aranesp 300 mcg subcutaneously Intermittently to keep his hemoglobin above 10.  Interim History: [name] presents today for a followup visit. Since his last visit, he reports reasonably fair without any complaints. He continues to live independently including driving and attending to activities of daily living. He uses a walker for mobility and continues to drive. He does report lower extremity edema which has not changed dramatically. He does not report any excessive fatigue, tiredness or changes in his performance status. He does not report any hematochezia or melena. He denies any recent hospitalizations or illnesses. He does not report any bone pain or pathological fractures.  He does not report any headaches or blurry vision or seizure. He does not report any fevers, chills or sweats. He reports no chest pain or palpitation. He has not reported any wheezing or shortness of breath. Does have coronary nausea or vomiting or abdominal pain. Remainder of his review of systems unremarkable.   Medications: I have reviewed the patient's current medications. Current Outpatient Prescriptions  Medication Sig Dispense Refill  . albuterol (PROVENTIL HFA;VENTOLIN HFA) 108 (90 Base) MCG/ACT inhaler Inhale 2 puffs into the lungs every 6 (six) hours as needed for wheezing or shortness of breath. 1 Inhaler 6  . aspirin EC 81 MG tablet Take 81 mg by mouth daily.    . calcium carbonate 100 mg/ml SUSP Take 600 mg by mouth.    . darbepoetin alfa-polysorbate  (ARANESP, ALB FREE, SURECLICK) 322 MCG/ML injection Inject 500 mcg into the skin every 6 (six) months. Every 2 months    . furosemide (LASIX) 40 MG tablet Take 40 mg by mouth See admin instructions. Take 40mg  daily.  May take an additional 40mg  for leg swelling    . lisinopril (PRINIVIL,ZESTRIL) 20 MG tablet Take 0.5 tablets (10 mg total) by mouth daily. 45 tablet 3  . Multiple Vitamins-Minerals (PRESERVISION AREDS PO) Take 1 tablet by mouth 2 (two) times daily.    . mupirocin ointment (BACTROBAN) 2 % Apply 1 application topically 2 (two) times daily. 22 g 6  . omeprazole (PRILOSEC) 20 MG capsule Take 20 mg by mouth daily.     Marland Kitchen oxybutynin (DITROPAN XL) 15 MG 24 hr tablet Take 15 mg by mouth once a week.     Marland Kitchen oxyCODONE (OXY IR/ROXICODONE) 5 MG immediate release tablet Take 1-2 tablets (5-10 mg total) by mouth every 3 (three) hours as needed for moderate pain. (Patient taking differently: Take 5-10 mg by mouth every 6 (six) hours as needed for moderate pain. ) 40 tablet 0  . predniSONE (DELTASONE) 10 MG tablet Take 1 tablet (10 mg total) by mouth daily with breakfast. 30 tablet 0  . Umeclidinium-Vilanterol (ANORO ELLIPTA) 62.5-25 MCG/INH AEPB Inhale 1 puff into the lungs daily.     . vitamin B-12 (CYANOCOBALAMIN) 500 MCG tablet Take 500 mcg by mouth daily.      No current facility-administered medications for this visit.     Allergies: No Known Allergies  Past Medical History, Surgical history, Social history, and Family History were reviewed and updated.  Physical Exam: Blood pressure (!) 127/53, pulse 87, temperature 98.4 F (36.9 C), temperature source Oral, resp. rate 18, height 5\' 10"  (1.778 m), weight 171 lb 9.6 oz (77.8 kg), SpO2 100 %. ECOG: 1 General appearance: Well-appearing gentleman without distress. Head: Normocephalic, without obvious abnormality no oral thrush or ulcers. Neck: no adenopathy Lymph nodes: Cervical, supraclavicular, and axillary nodes normal. Heart:regular  rate and rhythm, S1, S2 normal, no murmur, click, rub or gallop Lung:chest clear, no wheezing, rales, normal symmetric air entry Abdomen: soft, non-tender, without masses or organomegaly no shifting dullness or ascites. EXT:no erythema, induration, or nodules   Lab Results: Lab Results  Component Value Date   WBC 5.7 11/16/2016   HGB 10.3 (L) 11/16/2016   HCT 32.5 (L) 11/16/2016   MCV 95.6 11/16/2016   PLT 127 (L) 11/16/2016     Chemistry      Component Value Date/Time   NA 143 05/18/2016 1254   K 4.1 05/18/2016 1254   CL 102 08/06/2015 1454   CO2 24 05/18/2016 1254   BUN 36.4 (H) 05/18/2016 1254   CREATININE 1.4 (H) 05/18/2016 1254      Component Value Date/Time   CALCIUM 9.3 05/18/2016 1254   ALKPHOS 71 05/18/2016 1254   AST 22 05/18/2016 1254   ALT 10 05/18/2016 1254   BILITOT 0.68 05/18/2016 1254       Impression and Plan:  83 year old gentleman with the following issues. 1. Multifactorial anemia.  There is an element of anemia of renal insufficiency. He has received Aranesp in the past but no longer receiving it. His hemoglobin appears adequate at this time. The plan is to continue with observation and surveillance and restart growth factor support if needed to in the future. 2. Prostate cancer. His PSA is being checked routinely by Dr. Karsten Ro. No evidence of relapse noted. 3. Follow-up will be in 6 months sooner if needed to.  Zola Button, MD 10/2/201811:39 AM

## 2016-11-16 NOTE — Telephone Encounter (Signed)
Gave patient AVS and calendar of upcoming April 2019 appointments. °

## 2016-11-23 DIAGNOSIS — Z23 Encounter for immunization: Secondary | ICD-10-CM | POA: Diagnosis not present

## 2016-11-23 DIAGNOSIS — G4733 Obstructive sleep apnea (adult) (pediatric): Secondary | ICD-10-CM | POA: Diagnosis not present

## 2016-11-23 DIAGNOSIS — K5901 Slow transit constipation: Secondary | ICD-10-CM | POA: Diagnosis not present

## 2016-11-23 DIAGNOSIS — D649 Anemia, unspecified: Secondary | ICD-10-CM | POA: Diagnosis not present

## 2016-11-23 DIAGNOSIS — G629 Polyneuropathy, unspecified: Secondary | ICD-10-CM | POA: Diagnosis not present

## 2016-11-23 DIAGNOSIS — J310 Chronic rhinitis: Secondary | ICD-10-CM | POA: Diagnosis not present

## 2016-11-23 DIAGNOSIS — M48061 Spinal stenosis, lumbar region without neurogenic claudication: Secondary | ICD-10-CM | POA: Diagnosis not present

## 2016-11-23 DIAGNOSIS — I10 Essential (primary) hypertension: Secondary | ICD-10-CM | POA: Diagnosis not present

## 2016-11-23 DIAGNOSIS — R42 Dizziness and giddiness: Secondary | ICD-10-CM | POA: Diagnosis not present

## 2016-11-23 DIAGNOSIS — R2689 Other abnormalities of gait and mobility: Secondary | ICD-10-CM | POA: Diagnosis not present

## 2016-11-23 DIAGNOSIS — M5440 Lumbago with sciatica, unspecified side: Secondary | ICD-10-CM | POA: Diagnosis not present

## 2016-11-23 DIAGNOSIS — H3530 Unspecified macular degeneration: Secondary | ICD-10-CM | POA: Diagnosis not present

## 2016-11-25 DIAGNOSIS — J329 Chronic sinusitis, unspecified: Secondary | ICD-10-CM | POA: Diagnosis not present

## 2016-11-25 DIAGNOSIS — J310 Chronic rhinitis: Secondary | ICD-10-CM | POA: Diagnosis not present

## 2016-12-01 ENCOUNTER — Other Ambulatory Visit: Payer: Self-pay | Admitting: Otolaryngology

## 2016-12-01 DIAGNOSIS — J310 Chronic rhinitis: Secondary | ICD-10-CM

## 2016-12-03 ENCOUNTER — Ambulatory Visit
Admission: RE | Admit: 2016-12-03 | Discharge: 2016-12-03 | Disposition: A | Discharge status: Home / Self Care | Payer: Medicare Other | Source: Ambulatory Visit | Attending: Otolaryngology | Admitting: Otolaryngology

## 2016-12-03 DIAGNOSIS — J310 Chronic rhinitis: Secondary | ICD-10-CM

## 2016-12-08 ENCOUNTER — Other Ambulatory Visit: Payer: Self-pay | Admitting: Anesthesiology

## 2016-12-09 ENCOUNTER — Other Ambulatory Visit: Payer: Self-pay | Admitting: Physical Medicine and Rehabilitation

## 2016-12-09 ENCOUNTER — Emergency Department (HOSPITAL_COMMUNITY)
Admission: EM | Admit: 2016-12-09 | Discharge: 2016-12-10 | Disposition: A | Discharge status: Home / Self Care | Payer: No Typology Code available for payment source | Attending: Emergency Medicine | Admitting: Emergency Medicine

## 2016-12-09 ENCOUNTER — Encounter (HOSPITAL_COMMUNITY): Payer: Self-pay | Admitting: Emergency Medicine

## 2016-12-09 ENCOUNTER — Emergency Department (HOSPITAL_COMMUNITY): Payer: No Typology Code available for payment source

## 2016-12-09 DIAGNOSIS — Y929 Unspecified place or not applicable: Secondary | ICD-10-CM | POA: Insufficient documentation

## 2016-12-09 DIAGNOSIS — J449 Chronic obstructive pulmonary disease, unspecified: Secondary | ICD-10-CM | POA: Insufficient documentation

## 2016-12-09 DIAGNOSIS — Z96651 Presence of right artificial knee joint: Secondary | ICD-10-CM | POA: Diagnosis not present

## 2016-12-09 DIAGNOSIS — R072 Precordial pain: Secondary | ICD-10-CM | POA: Diagnosis not present

## 2016-12-09 DIAGNOSIS — S299XXA Unspecified injury of thorax, initial encounter: Secondary | ICD-10-CM | POA: Diagnosis not present

## 2016-12-09 DIAGNOSIS — I10 Essential (primary) hypertension: Secondary | ICD-10-CM | POA: Diagnosis not present

## 2016-12-09 DIAGNOSIS — Z79899 Other long term (current) drug therapy: Secondary | ICD-10-CM | POA: Insufficient documentation

## 2016-12-09 DIAGNOSIS — S2220XA Unspecified fracture of sternum, initial encounter for closed fracture: Secondary | ICD-10-CM | POA: Diagnosis not present

## 2016-12-09 DIAGNOSIS — Y999 Unspecified external cause status: Secondary | ICD-10-CM | POA: Diagnosis not present

## 2016-12-09 DIAGNOSIS — Z7982 Long term (current) use of aspirin: Secondary | ICD-10-CM | POA: Insufficient documentation

## 2016-12-09 DIAGNOSIS — Z8546 Personal history of malignant neoplasm of prostate: Secondary | ICD-10-CM | POA: Diagnosis not present

## 2016-12-09 DIAGNOSIS — Y939 Activity, unspecified: Secondary | ICD-10-CM | POA: Insufficient documentation

## 2016-12-09 DIAGNOSIS — Z87891 Personal history of nicotine dependence: Secondary | ICD-10-CM | POA: Diagnosis not present

## 2016-12-09 DIAGNOSIS — S2222XA Fracture of body of sternum, initial encounter for closed fracture: Secondary | ICD-10-CM | POA: Diagnosis not present

## 2016-12-09 DIAGNOSIS — S8011XA Contusion of right lower leg, initial encounter: Secondary | ICD-10-CM | POA: Diagnosis not present

## 2016-12-09 DIAGNOSIS — M546 Pain in thoracic spine: Secondary | ICD-10-CM

## 2016-12-09 MED ORDER — HYDROCODONE-ACETAMINOPHEN 5-325 MG PO TABS
2.0000 | ORAL_TABLET | Freq: Once | ORAL | Status: AC
  Administered 2016-12-09: 2 via ORAL
  Filled 2016-12-09: qty 2
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
## 2016-12-10 ENCOUNTER — Other Ambulatory Visit: Payer: Self-pay | Admitting: Internal Medicine

## 2016-12-10 NOTE — Discharge Instructions (Signed)
USE INCENTIVE SPIROMETER AS OFTEN AS YOU CAN DURING THE DAYTIME FOR THE NEXT 1-2 WEEKS. FOLLOW UP WITH YOUR PRIMARY CARE DOCTOR. IF YOU HAVE SEVERE PAIN AND PROBLEMS WITH YOUR CHEST INJURY YOU CAN ALSO SCHEDULE A FOLLOW UP APPOINTMENT WITH THE TRAUMA CLINIC. CONTACT PAIN CLINIC TO NOTIFY THEM OF YOUR NEW ACUTE PAINFUL CONDITION. RETURN TO ER IF YOU HAVE ANY BREATHING PROBLEMS, FEVER, ABDOMINAL PAIN, OR WORSENING SYMPTOMS.

## 2016-12-13 ENCOUNTER — Ambulatory Visit
Admission: RE | Admit: 2016-12-13 | Discharge: 2016-12-13 | Disposition: A | Discharge status: Home / Self Care | Payer: Medicare Other | Source: Ambulatory Visit | Attending: General Surgery | Admitting: General Surgery

## 2016-12-13 ENCOUNTER — Other Ambulatory Visit: Payer: Self-pay | Admitting: General Surgery

## 2016-12-13 ENCOUNTER — Telehealth (HOSPITAL_COMMUNITY): Payer: Self-pay

## 2016-12-13 ENCOUNTER — Ambulatory Visit
Admission: RE | Admit: 2016-12-13 | Discharge: 2016-12-13 | Disposition: A | Discharge status: Home / Self Care | Payer: Medicare Other | Source: Ambulatory Visit | Attending: Physical Medicine and Rehabilitation | Admitting: Physical Medicine and Rehabilitation

## 2016-12-13 DIAGNOSIS — S2220XA Unspecified fracture of sternum, initial encounter for closed fracture: Secondary | ICD-10-CM

## 2016-12-13 DIAGNOSIS — J90 Pleural effusion, not elsewhere classified: Secondary | ICD-10-CM | POA: Diagnosis not present

## 2016-12-13 DIAGNOSIS — M47814 Spondylosis without myelopathy or radiculopathy, thoracic region: Secondary | ICD-10-CM | POA: Diagnosis not present

## 2016-12-13 DIAGNOSIS — M546 Pain in thoracic spine: Secondary | ICD-10-CM

## 2016-12-13 NOTE — Telephone Encounter (Signed)
Patient has to be seen by [facility] trauma physician prior to evaluation in clinic by a APP. Appointment moved to Wednesday 12/15/16 at 11:40 with Dr. Grandville Silos. Our office to notify patient of appointment change.
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
## 2016-12-13 NOTE — Telephone Encounter (Signed)
Patient scheduled for chest Xray at [facility] imaging and trauma clinic appointment tomorrow 10/30 at 10:30 AM. Patient called an notified of appointment.

## 2016-12-15 ENCOUNTER — Other Ambulatory Visit: Payer: Self-pay

## 2016-12-15 DIAGNOSIS — S2220XA Unspecified fracture of sternum, initial encounter for closed fracture: Secondary | ICD-10-CM | POA: Diagnosis not present

## 2016-12-15 DIAGNOSIS — S8010XA Contusion of unspecified lower leg, initial encounter: Secondary | ICD-10-CM | POA: Diagnosis not present

## 2016-12-17 DIAGNOSIS — Z79899 Other long term (current) drug therapy: Secondary | ICD-10-CM | POA: Diagnosis not present

## 2016-12-17 DIAGNOSIS — H353211 Exudative age-related macular degeneration, right eye, with active choroidal neovascularization: Secondary | ICD-10-CM | POA: Diagnosis not present

## 2016-12-17 DIAGNOSIS — R630 Anorexia: Secondary | ICD-10-CM | POA: Diagnosis not present

## 2016-12-17 DIAGNOSIS — I10 Essential (primary) hypertension: Secondary | ICD-10-CM | POA: Diagnosis not present

## 2016-12-17 DIAGNOSIS — S8011XA Contusion of right lower leg, initial encounter: Secondary | ICD-10-CM | POA: Diagnosis not present

## 2016-12-17 DIAGNOSIS — Z5181 Encounter for therapeutic drug level monitoring: Secondary | ICD-10-CM | POA: Diagnosis not present

## 2016-12-17 DIAGNOSIS — S2222XA Fracture of body of sternum, initial encounter for closed fracture: Secondary | ICD-10-CM | POA: Diagnosis not present

## 2016-12-20 ENCOUNTER — Ambulatory Visit (INDEPENDENT_AMBULATORY_CARE_PROVIDER_SITE_OTHER): Payer: Medicare Other

## 2016-12-20 ENCOUNTER — Encounter (INDEPENDENT_AMBULATORY_CARE_PROVIDER_SITE_OTHER): Payer: Self-pay | Admitting: Orthopedic Surgery

## 2016-12-20 ENCOUNTER — Ambulatory Visit (INDEPENDENT_AMBULATORY_CARE_PROVIDER_SITE_OTHER): Payer: Medicare Other | Admitting: Orthopedic Surgery

## 2016-12-20 VITALS — Ht 70.0 in | Wt 171.0 lb

## 2016-12-20 DIAGNOSIS — M25571 Pain in right ankle and joints of right foot: Secondary | ICD-10-CM

## 2016-12-20 DIAGNOSIS — I872 Venous insufficiency (chronic) (peripheral): Secondary | ICD-10-CM
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
## 2016-12-27 ENCOUNTER — Encounter (INDEPENDENT_AMBULATORY_CARE_PROVIDER_SITE_OTHER): Payer: Self-pay | Admitting: Orthopedic Surgery

## 2016-12-27 ENCOUNTER — Ambulatory Visit (INDEPENDENT_AMBULATORY_CARE_PROVIDER_SITE_OTHER): Payer: Medicare Other | Admitting: Orthopedic Surgery

## 2016-12-27 VITALS — Ht 70.0 in | Wt 171.0 lb

## 2016-12-27 DIAGNOSIS — M79604 Pain in right leg: Secondary | ICD-10-CM

## 2016-12-27 DIAGNOSIS — M7989 Other specified soft tissue disorders: Secondary | ICD-10-CM | POA: Diagnosis not present

## 2016-12-27 DIAGNOSIS — L97911 Non-pressure chronic ulcer of unspecified part of right lower leg limited to breakdown of skin: Secondary | ICD-10-CM
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
## 2017-01-03 ENCOUNTER — Encounter (INDEPENDENT_AMBULATORY_CARE_PROVIDER_SITE_OTHER): Payer: Self-pay | Admitting: Orthopedic Surgery

## 2017-01-03 ENCOUNTER — Ambulatory Visit (INDEPENDENT_AMBULATORY_CARE_PROVIDER_SITE_OTHER): Payer: Medicare Other | Admitting: Orthopedic Surgery

## 2017-01-03 VITALS — Ht 70.0 in | Wt 171.0 lb

## 2017-01-03 DIAGNOSIS — I872 Venous insufficiency (chronic) (peripheral): Secondary | ICD-10-CM

## 2017-01-03 NOTE — Progress Notes (Signed)
Office Visit Note   Patient: [name] [name]           Date of Birth: [birth year]           MRN: [id number] Visit Date: 01/03/2017              Requested by: [name], MD [place], [place] [address] PCP: [name], MD  Chief Complaint  Patient presents with  . Right Leg - Follow-up    Unna boot compression dressing       HPI: Patient presents in follow-up for venous stasis ulceration to the right lower extremity he underwent compression.  He states the swelling has gone down and he feels much better.  He states he wants to discontinue using the compression wraps and wants to pursue compression stockings.  Patient also has a sternal fracture and would like to follow-up in 3 weeks for the sternal fracture.  Assessment & Plan: Visit Diagnoses:  1. Venous stasis dermatitis of right lower extremity     Plan: Recommended compression stockings.  Recommend avoiding compression stockings with a zipper do not feel this would provide sufficient compression.  Follow-up in 3 weeks for AP and lateral sternal radiograph follow-up on fracture.  Follow-Up Instructions: Return in about 3 weeks (around 01/24/2017).   Ortho Exam  Patient is alert, oriented, no adenopathy, well-dressed, normal affect, normal respiratory effort. Examination patient has an antalgic gait.  He has venous stasis changes in both legs but the ulcerations have healed in both legs.  There is no cellulitis no drainage no odor no signs of infection there is pitting edema up to the tibial tubercle.  The right calf is 36 cm in circumference the left calf is 35 cm in circumference.  Imaging: No results found. No images are attached to the encounter.  Labs: Lab Results  Component Value Date   CRP 16.2 (H) 10/20/2011   LABURIC 4.7 10/17/2006   REPTSTATUS 03/31/2015 FINAL 03/30/2015   GRAMSTAIN Abundant 12/18/2015   GRAMSTAIN WBC present-predominately PMN 12/18/2015   GRAMSTAIN No Squamous  Epithelial Cells Seen 12/18/2015   GRAMSTAIN No Organisms Seen 12/18/2015   CULT 9,000 COLONIES/mL INSIGNIFICANT GROWTH 03/30/2015   LABORGA NORMAL SKIN FLORA 12/18/2015    Orders:  No orders of the defined types were placed in this encounter.  No orders of the defined types were placed in this encounter.    Procedures: No procedures performed  Clinical Data: No additional findings.  ROS:  All other systems negative, except as noted in the HPI. Review of Systems  Objective: Vital Signs: Ht 5\' 10"  (1.778 m)   Wt 171 lb (77.6 kg)   BMI 24.54 kg/m   Specialty Comments:  No specialty comments available.  PMFS History: Patient Active Problem List   Diagnosis Date Noted  . Impingement syndrome of right shoulder 06/16/2016  . Impingement syndrome of left shoulder 06/16/2016  . Bilateral leg edema 06/14/2016  . Dizziness and giddiness 06/26/2015  . Abnormality of gait 06/26/2015  . Anxiety 04/14/2015  . GERD (gastroesophageal reflux disease) 04/03/2015  . OSA (obstructive sleep apnea) 04/03/2015  . HLD (hyperlipidemia) 04/03/2015  . SOB (shortness of breath)   . Left elbow fracture 03/29/2015  . Dizziness 01/01/2015  . LBP (low back pain) 01/01/2015  . Malignant neoplasm of prostate ([place]) 01/01/2015  . BP (high blood pressure) 01/01/2015  . DOE (dyspnea on exertion) 10/26/2013  . Essential hypertension, benign 10/26/2013  . Post-traumatic wound infection 10/17/2012  .  Cellulitis 10/17/2012  . Atrial fibrillation ([place]) 10/22/2011  . DVT, lower extremity, distal, chronic ([place]) 10/22/2011  . Pacemaker 10/19/2011  . Prostate cancer ([place]) 10/19/2011  . COPD (chronic obstructive pulmonary disease) with emphysema ([place]) 02/04/2011  . Anemia 12/10/2010   Past Medical History:  Diagnosis Date  . Abnormality of gait 06/26/2015  . Anemia    occ. injections for this at [facility]  . Arthritis    "back"  . Chronic lower back pain   . COPD (chronic obstructive  pulmonary disease) ([place]) 10/19/2011   "touch"  . Dizziness and giddiness 06/26/2015  . Exertional dyspnea 10/19/2011  . Fall   . GERD (gastroesophageal reflux disease)   . Hearing loss   . History of blood transfusion    "think it was related to pacemaker placement"  . Hypertension   . Leg swelling 10/19/2011   LLE  . OSA on CPAP 10/19/2011   "sometimes wear CPAP"  . Pacemaker    Medtronic  . Prostate cancer ([place])    S/P radiation tx ~ 2008  . Skin change    12-24-14 few scalp lesions burned off"old age spots"    Family History  Problem Relation Age of Onset  . Heart disease Father   . Heart disease Mother   . Colon cancer Mother   . Stroke Mother   . Heart attack Neg Hx   . Hypertension Neg Hx     Past Surgical History:  Procedure Laterality Date  . BALLOON DILATION N/A 01/14/2015   Performed by Garlan Fair, MD at [facility]  . CARDIAC CATHETERIZATION    . CARPAL TUNNEL RELEASE LEFT THUMB Left 11/14/2014   Performed by Daryll Brod, MD at [facility]  . CATARACT EXTRACTION W/ INTRAOCULAR LENS  IMPLANT, BILATERAL    . CHOLECYSTECTOMY N/A 10/23/2011   Performed by Rolm Bookbinder, MD at [facility] OR  . ESOPHAGOGASTRODUODENOSCOPY (EGD) WITH PROPOFOL N/A 01/14/2015   Performed by Garlan Fair, MD at [facility]  . EYE SURGERY Bilateral    cataract  . HERNIA REPAIR  ~ 2010;    lap; VHR  . HERNIA REPAIR  ~ 6269   lap vh complicated by recurrence  . HERNIA REPAIR  09/2011   open; VHR  . INSERT / REPLACE / REMOVE PACEMAKER  ~ 2009   initial placement  . IRRIGATION AND DEBRIDEMENT EXTREMITY Left 10/22/2012   Performed by Marianna Payment, MD at [facility] ORS  . JOINT REPLACEMENT    . LAPAROSCOPIC CHOLECYSTECTOMY WITH INTRAOPERATIVE CHOLANGIOGRAM N/A 10/23/2011   Performed by Rolm Bookbinder, MD at [facility]  . LEFT ELBOW OPEN REDUCTION INTERNAL FIXATION (ORIF) DISTAL HUMERUS FRACTURE WITH OLECRANON OSTEOTOMY AND ULNAR NERVE RELEASE Left 03/29/2015   Performed by  Roseanne Kaufman, MD at [facility]  . LEFT LEG IRRIGATION AND DEBRIDEMENT, AND WOUND VAC APPLICATION Left 05/23/5460   Performed by Marianna Payment, MD at [facility] ORS  . SKIN GRAFT SPLIT THICKNESS Left 10/22/2012   Performed by Marianna Payment, MD at [facility] ORS  . TOTAL KNEE ARTHROPLASTY  1990's   right   Social History   Occupational History  . 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 Smoking status: Former Smoker    Packs/day: 1.50    Years: 35.00    Pack years: 52.50    Types: Cigarettes    Last attempt to quit: 02/15/1973    Years  since quitting: 43.9  . Smokeless tobacco: Never Used  Substance and Sexual Activity  . Alcohol use: No  . Drug use: No  . Sexual activity: No

## 2017-01-12 DIAGNOSIS — L570 Actinic keratosis: Secondary | ICD-10-CM | POA: Diagnosis not present

## 2017-01-12 DIAGNOSIS — L821 Other seborrheic keratosis: Secondary | ICD-10-CM | POA: Diagnosis not present

## 2017-01-17 DIAGNOSIS — R748 Abnormal levels of other serum enzymes: Secondary | ICD-10-CM | POA: Diagnosis not present

## 2017-01-17 DIAGNOSIS — I10 Essential (primary) hypertension: Secondary | ICD-10-CM | POA: Diagnosis not present

## 2017-01-17 DIAGNOSIS — S2221XD Fracture of manubrium, subsequent encounter for fracture with routine healing: Secondary | ICD-10-CM | POA: Diagnosis not present

## 2017-01-24 ENCOUNTER — Ambulatory Visit (INDEPENDENT_AMBULATORY_CARE_PROVIDER_SITE_OTHER): Payer: Medicare Other | Admitting: Orthopedic Surgery

## 2017-01-26 DIAGNOSIS — G894 Chronic pain syndrome: Secondary | ICD-10-CM | POA: Diagnosis not present

## 2017-01-26 DIAGNOSIS — Z79891 Long term (current) use of opiate analgesic: Secondary | ICD-10-CM | POA: Diagnosis not present

## 2017-01-26 DIAGNOSIS — M546 Pain in thoracic spine: Secondary | ICD-10-CM | POA: Diagnosis not present

## 2017-01-26 DIAGNOSIS — M4316 Spondylolisthesis, lumbar region: Secondary | ICD-10-CM | POA: Diagnosis not present

## 2017-01-31 ENCOUNTER — Encounter (INDEPENDENT_AMBULATORY_CARE_PROVIDER_SITE_OTHER): Payer: Self-pay | Admitting: Orthopedic Surgery

## 2017-01-31 ENCOUNTER — Ambulatory Visit (INDEPENDENT_AMBULATORY_CARE_PROVIDER_SITE_OTHER): Payer: Medicare Other | Admitting: Orthopedic Surgery

## 2017-01-31 ENCOUNTER — Ambulatory Visit (INDEPENDENT_AMBULATORY_CARE_PROVIDER_SITE_OTHER): Payer: Medicare Other

## 2017-01-31 VITALS — Ht 70.0 in | Wt 171.0 lb

## 2017-01-31 DIAGNOSIS — M25571 Pain in right ankle and joints of right foot: Secondary | ICD-10-CM | POA: Diagnosis not present

## 2017-01-31 DIAGNOSIS — R0789 Other chest pain: Secondary | ICD-10-CM

## 2017-01-31 DIAGNOSIS — R634 Abnormal weight loss: Secondary | ICD-10-CM | POA: Diagnosis not present

## 2017-01-31 DIAGNOSIS — Z6825 Body mass index (BMI) 25.0-25.9, adult: Secondary | ICD-10-CM | POA: Diagnosis not present

## 2017-01-31 DIAGNOSIS — Z79899 Other long term (current) drug therapy: Secondary | ICD-10-CM | POA: Diagnosis not present

## 2017-01-31 DIAGNOSIS — I872 Venous insufficiency (chronic) (peripheral): Secondary | ICD-10-CM

## 2017-01-31 DIAGNOSIS — H1589 Other disorders of sclera: Secondary | ICD-10-CM | POA: Diagnosis not present
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
## 2017-02-02 ENCOUNTER — Other Ambulatory Visit: Payer: Self-pay | Admitting: Gastroenterology

## 2017-02-02 DIAGNOSIS — R634 Abnormal weight loss: Secondary | ICD-10-CM

## 2017-02-10 ENCOUNTER — Ambulatory Visit (INDEPENDENT_AMBULATORY_CARE_PROVIDER_SITE_OTHER): Payer: Medicare Other | Admitting: *Deleted

## 2017-02-10 DIAGNOSIS — I495 Sick sinus syndrome: Secondary | ICD-10-CM

## 2017-02-10 NOTE — Progress Notes (Signed)
Remote pacemaker transmission.   

## 2017-02-11 ENCOUNTER — Ambulatory Visit
Admission: RE | Admit: 2017-02-11 | Discharge: 2017-02-11 | Disposition: A | Discharge status: Home / Self Care | Payer: Medicare Other | Source: Ambulatory Visit | Attending: Gastroenterology | Admitting: Gastroenterology

## 2017-02-11 ENCOUNTER — Encounter: Payer: Self-pay | Admitting: Cardiology

## 2017-02-11 DIAGNOSIS — R634 Abnormal weight loss: Secondary | ICD-10-CM

## 2017-02-11 DIAGNOSIS — K5900 Constipation, unspecified: Secondary | ICD-10-CM | POA: Diagnosis not present

## 2017-02-11 MED ORDER — IOPAMIDOL (ISOVUE-300) INJECTION 61%
100.0000 mL | Freq: Once | INTRAVENOUS | Status: AC | PRN
  Administered 2017-02-11: 100 mL via INTRAVENOUS

## 2017-02-12 LAB — CUP PACEART REMOTE DEVICE CHECK
Brady Statistic AP VP Percent: 2 %
Implantable Lead Implant Date: 20080826
Implantable Lead Implant Date: 20080826
Implantable Lead Location: 753860
Implantable Pulse Generator Implant Date: 20080826
Lead Channel Impedance Value: 428 Ohm
Lead Channel Impedance Value: 578 Ohm
Lead Channel Pacing Threshold Amplitude: 1.375 V
Lead Channel Pacing Threshold Pulse Width: 0.4 ms
Lead Channel Sensing Intrinsic Amplitude: 2.8 mV
Lead Channel Sensing Intrinsic Amplitude: 5.6 mV
Lead Channel Setting Pacing Amplitude: 1.5 V
Lead Channel Setting Sensing Sensitivity: 2 mV
MDC IDC LEAD LOCATION: 753859
MDC IDC MSMT BATTERY IMPEDANCE: 1607 Ohm
MDC IDC MSMT BATTERY REMAINING LONGEVITY: 32 mo
MDC IDC MSMT BATTERY VOLTAGE: 2.74 V
MDC IDC MSMT LEADCHNL RA PACING THRESHOLD AMPLITUDE: 0.75 V
MDC IDC MSMT LEADCHNL RV PACING THRESHOLD PULSEWIDTH: 0.4 ms
MDC IDC SESS DTM: 20181227191510
MDC IDC SET LEADCHNL RV PACING AMPLITUDE: 2.5 V
MDC IDC SET LEADCHNL RV PACING PULSEWIDTH: 0.4 ms
MDC IDC STAT BRADY AP VS PERCENT: 70 %
MDC IDC STAT BRADY AS VP PERCENT: 1 %
MDC IDC STAT BRADY AS VS PERCENT: 27 %

## 2017-02-16 ENCOUNTER — Encounter: Payer: Self-pay | Admitting: Podiatry

## 2017-02-16 ENCOUNTER — Ambulatory Visit (INDEPENDENT_AMBULATORY_CARE_PROVIDER_SITE_OTHER): Payer: Medicare Other | Admitting: Podiatry

## 2017-02-16 DIAGNOSIS — M779 Enthesopathy, unspecified: Secondary | ICD-10-CM

## 2017-02-16 MED ORDER — TRIAMCINOLONE ACETONIDE 10 MG/ML IJ SUSP
10.0000 mg | Freq: Once | INTRAMUSCULAR | Status: AC
  Administered 2017-02-16: 10 mg

## 2017-02-16 NOTE — Progress Notes (Signed)
Subjective:   Patient ID: [name] [name], male   DOB: 84 y.o.   MRN: [id number]   HPI Patient presents with exquisite discomfort in the sinus tarsi bilateral   ROS      Objective:  Physical Exam  Neurovascular status intact with inflammation of the sinus tarsi bilateral     Assessment:  Capsulitis bilateral sinus tarsi     Plan:  H&P condition reviewed and injected the sinus tarsi bilateral 3 mg Kenalog 5 mg Xylocaine and reappoint as needed

## 2017-02-17 DIAGNOSIS — C61 Malignant neoplasm of prostate: Secondary | ICD-10-CM | POA: Diagnosis not present

## 2017-02-21 ENCOUNTER — Ambulatory Visit (INDEPENDENT_AMBULATORY_CARE_PROVIDER_SITE_OTHER): Payer: Medicare Other | Admitting: Family

## 2017-02-22 DIAGNOSIS — Z8546 Personal history of malignant neoplasm of prostate: Secondary | ICD-10-CM | POA: Diagnosis not present

## 2017-02-22 DIAGNOSIS — N400 Enlarged prostate without lower urinary tract symptoms: Secondary | ICD-10-CM | POA: Diagnosis not present

## 2017-02-22 DIAGNOSIS — N3943 Post-void dribbling: Secondary | ICD-10-CM | POA: Diagnosis not present

## 2017-03-01 DIAGNOSIS — D649 Anemia, unspecified: Secondary | ICD-10-CM | POA: Diagnosis not present

## 2017-03-01 DIAGNOSIS — M48061 Spinal stenosis, lumbar region without neurogenic claudication: Secondary | ICD-10-CM | POA: Diagnosis not present

## 2017-03-01 DIAGNOSIS — R198 Other specified symptoms and signs involving the digestive system and abdomen: Secondary | ICD-10-CM | POA: Diagnosis not present

## 2017-03-01 DIAGNOSIS — M150 Primary generalized (osteo)arthritis: Secondary | ICD-10-CM | POA: Diagnosis not present

## 2017-03-01 DIAGNOSIS — R2689 Other abnormalities of gait and mobility: Secondary | ICD-10-CM | POA: Diagnosis not present

## 2017-03-03 DIAGNOSIS — D649 Anemia, unspecified: Secondary | ICD-10-CM | POA: Diagnosis not present

## 2017-03-15 DIAGNOSIS — M19041 Primary osteoarthritis, right hand: Secondary | ICD-10-CM | POA: Diagnosis not present

## 2017-03-15 DIAGNOSIS — M19012 Primary osteoarthritis, left shoulder: Secondary | ICD-10-CM | POA: Diagnosis not present

## 2017-03-15 DIAGNOSIS — M79641 Pain in right hand: Secondary | ICD-10-CM | POA: Diagnosis not present

## 2017-03-15 DIAGNOSIS — M25571 Pain in right ankle and joints of right foot: Secondary | ICD-10-CM | POA: Diagnosis not present

## 2017-03-15 DIAGNOSIS — M1990 Unspecified osteoarthritis, unspecified site: Secondary | ICD-10-CM | POA: Diagnosis not present

## 2017-03-15 DIAGNOSIS — M79672 Pain in left foot: Secondary | ICD-10-CM | POA: Diagnosis not present

## 2017-03-15 DIAGNOSIS — M79671 Pain in right foot: Secondary | ICD-10-CM | POA: Diagnosis not present

## 2017-03-15 DIAGNOSIS — M19071 Primary osteoarthritis, right ankle and foot: Secondary | ICD-10-CM | POA: Diagnosis not present

## 2017-03-15 DIAGNOSIS — M25512 Pain in left shoulder: Secondary | ICD-10-CM | POA: Diagnosis not present

## 2017-03-15 DIAGNOSIS — M19072 Primary osteoarthritis, left ankle and foot: Secondary | ICD-10-CM | POA: Diagnosis not present

## 2017-03-15 DIAGNOSIS — M79642 Pain in left hand: Secondary | ICD-10-CM | POA: Diagnosis not present

## 2017-03-15 DIAGNOSIS — M791 Myalgia, unspecified site: Secondary | ICD-10-CM | POA: Diagnosis not present

## 2017-03-15 DIAGNOSIS — M160 Bilateral primary osteoarthritis of hip: Secondary | ICD-10-CM | POA: Diagnosis not present

## 2017-03-15 DIAGNOSIS — M353 Polymyalgia rheumatica: Secondary | ICD-10-CM | POA: Diagnosis not present

## 2017-03-15 DIAGNOSIS — M19042 Primary osteoarthritis, left hand: Secondary | ICD-10-CM | POA: Diagnosis not present

## 2017-03-15 DIAGNOSIS — M2550 Pain in unspecified joint: Secondary | ICD-10-CM | POA: Diagnosis not present

## 2017-03-15 DIAGNOSIS — M064 Inflammatory polyarthropathy: Secondary | ICD-10-CM | POA: Diagnosis not present

## 2017-03-15 DIAGNOSIS — M79673 Pain in unspecified foot: Secondary | ICD-10-CM | POA: Diagnosis not present

## 2017-03-15 DIAGNOSIS — M810 Age-related osteoporosis without current pathological fracture: Secondary | ICD-10-CM | POA: Diagnosis not present

## 2017-03-15 DIAGNOSIS — M25572 Pain in left ankle and joints of left foot: Secondary | ICD-10-CM | POA: Diagnosis not present

## 2017-03-15 DIAGNOSIS — M25511 Pain in right shoulder: Secondary | ICD-10-CM | POA: Diagnosis not present

## 2017-03-24 DIAGNOSIS — M810 Age-related osteoporosis without current pathological fracture: Secondary | ICD-10-CM | POA: Diagnosis not present

## 2017-03-30 DIAGNOSIS — M546 Pain in thoracic spine: Secondary | ICD-10-CM | POA: Diagnosis not present

## 2017-03-30 DIAGNOSIS — G894 Chronic pain syndrome: Secondary | ICD-10-CM | POA: Diagnosis not present

## 2017-03-30 DIAGNOSIS — Z79891 Long term (current) use of opiate analgesic: Secondary | ICD-10-CM | POA: Diagnosis not present

## 2017-03-30 DIAGNOSIS — M150 Primary generalized (osteo)arthritis: Secondary | ICD-10-CM | POA: Diagnosis not present

## 2017-03-31 DIAGNOSIS — D649 Anemia, unspecified: Secondary | ICD-10-CM | POA: Diagnosis not present

## 2017-04-08 ENCOUNTER — Telehealth (INDEPENDENT_AMBULATORY_CARE_PROVIDER_SITE_OTHER): Payer: Self-pay | Admitting: Orthopedic Surgery

## 2017-04-08 NOTE — Telephone Encounter (Signed)
Please advise. He last had bil shldr injections 06/2016.

## 2017-04-08 NOTE — Telephone Encounter (Signed)
Patient called wanting to know if he can get injections in his both shoulders when he come to his appointment 04/20/17? The number to contact patient is [phone]

## 2017-04-11 NOTE — Telephone Encounter (Signed)
Called pt and advised we will plan to inject both shoulders at his appt next week.

## 2017-04-11 NOTE — Telephone Encounter (Signed)
ok 

## 2017-04-15 DIAGNOSIS — M2550 Pain in unspecified joint: Secondary | ICD-10-CM | POA: Diagnosis not present

## 2017-04-15 DIAGNOSIS — K219 Gastro-esophageal reflux disease without esophagitis: Secondary | ICD-10-CM | POA: Diagnosis not present

## 2017-04-15 DIAGNOSIS — M810 Age-related osteoporosis without current pathological fracture: Secondary | ICD-10-CM | POA: Diagnosis not present

## 2017-04-15 DIAGNOSIS — M791 Myalgia, unspecified site: Secondary | ICD-10-CM | POA: Diagnosis not present

## 2017-04-15 DIAGNOSIS — M1990 Unspecified osteoarthritis, unspecified site: Secondary | ICD-10-CM | POA: Diagnosis not present

## 2017-04-18 ENCOUNTER — Other Ambulatory Visit (INDEPENDENT_AMBULATORY_CARE_PROVIDER_SITE_OTHER): Payer: Self-pay | Admitting: Orthopedic Surgery

## 2017-04-20 ENCOUNTER — Ambulatory Visit (INDEPENDENT_AMBULATORY_CARE_PROVIDER_SITE_OTHER): Payer: Medicare Other | Admitting: Orthopedic Surgery

## 2017-04-20 ENCOUNTER — Ambulatory Visit (INDEPENDENT_AMBULATORY_CARE_PROVIDER_SITE_OTHER): Payer: Medicare Other

## 2017-04-20 ENCOUNTER — Encounter (INDEPENDENT_AMBULATORY_CARE_PROVIDER_SITE_OTHER): Payer: Self-pay | Admitting: Orthopedic Surgery

## 2017-04-20 VITALS — Ht 70.0 in | Wt 171.0 lb

## 2017-04-20 DIAGNOSIS — I714 Abdominal aortic aneurysm, without rupture, unspecified: Secondary | ICD-10-CM

## 2017-04-20 DIAGNOSIS — M4716 Other spondylosis with myelopathy, lumbar region: Secondary | ICD-10-CM | POA: Diagnosis not present

## 2017-04-20 DIAGNOSIS — R29898 Other symptoms and signs involving the musculoskeletal system: Secondary | ICD-10-CM

## 2017-04-20 DIAGNOSIS — G992 Myelopathy in diseases classified elsewhere: Secondary | ICD-10-CM

## 2017-04-20 DIAGNOSIS — M48061 Spinal stenosis, lumbar region without neurogenic claudication: Secondary | ICD-10-CM

## 2017-04-20 NOTE — Progress Notes (Signed)
Office Visit Note   Patient: [name] [name]           Date of Birth: [birth year]           MRN: [id number] Visit Date: 04/20/2017              Requested by: [name], MD [place], [place] [address] PCP: [name], MD  Chief Complaint  Patient presents with  . Left Leg - Pain      HPI: Patient is a 84 year old gentleman who presents complaining of thigh weakness on the right weakness with hip flexion.  Patient states that his whole leg has numbness and tingling.  He states he has to lift his leg to get into the car.  He states he did have a rheumatology appointment about a month ago.  Patient states that he did see Dr. Arnoldo Morale in neurosurgery several years ago and patient states he was told that Dr. Arnoldo Morale does not recommend surgical intervention for his back.  Patient is currently seeing Dr. Hardin Negus  Assessment & Plan: Visit Diagnoses:  1. Weakness of right leg   2. 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 Hardin Negus for consideration for epidural steroid injections.  We will make an appointment with vascular vein surgery for evaluation of his abdominal aortic aneurysm.  Follow-Up Instructions: Return if symptoms worsen or fail to improve.   Ortho Exam  Patient is alert, oriented, no adenopathy, well-dressed, normal affect, normal respiratory effort. On examination patient has difficulty getting from sitting to a standing position.  Patient has good motor strength and all motor groups of both lower extremities except for hip flexion on the right which is 4/5 all the other motor groups are symmetric.  He has weakness with hip flexion on the right.  Imaging: Xr Lumbar Spine 2-3 Views  Result Date: 04/20/2017 2 view radiographs of the lumbar spine shows severe degenerative scoliosis with an aortic aneurysm that measures 42 cm in diameter.  No images are attached to the  encounter.  Labs: Lab Results  Component Value Date   CRP 16.2 (H) 10/20/2011   LABURIC 4.7 10/17/2006   REPTSTATUS 03/31/2015 FINAL 03/30/2015   GRAMSTAIN Abundant 12/18/2015   GRAMSTAIN WBC present-predominately PMN 12/18/2015   GRAMSTAIN No Squamous Epithelial Cells Seen 12/18/2015   GRAMSTAIN No Organisms Seen 12/18/2015   CULT 9,000 COLONIES/mL INSIGNIFICANT GROWTH 03/30/2015   LABORGA NORMAL SKIN FLORA 12/18/2015    @LABSALLVALUES (HGBA1)@  Body mass index is 24.54 kg/m.  Orders:  Orders Placed This Encounter  Procedures  . XR Lumbar Spine 2-3 Views   No orders of the defined types were placed in this encounter.    Procedures: No procedures performed  Clinical Data: No additional findings.  ROS:  All other systems negative, except as noted in the HPI. Review of Systems  Objective: Vital Signs: Ht 5\' 10"  (1.778 m)   Wt 171 lb (77.6 kg)   BMI 24.54 kg/m   Specialty Comments:  No specialty comments available.  PMFS History: Patient Active Problem List   Diagnosis Date Noted  . Weakness of right leg 04/20/2017  . 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 Impingement syndrome of right shoulder 06/16/2016  . Impingement syndrome of left shoulder 06/16/2016  . Bilateral leg edema 06/14/2016  . Dizziness and giddiness 06/26/2015  . Abnormality of gait 06/26/2015  .  Anxiety 04/14/2015  . GERD (gastroesophageal reflux disease) 04/03/2015  . OSA (obstructive sleep apnea) 04/03/2015  . HLD (hyperlipidemia) 04/03/2015  . SOB (shortness of breath)   . Left elbow fracture 03/29/2015  . Dizziness 01/01/2015  . LBP (low back pain) 01/01/2015  . Malignant neoplasm of prostate ([facility]) 01/01/2015  . BP (high blood pressure) 01/01/2015  . DOE (dyspnea on exertion) 10/26/2013  . Essential hypertension, benign 10/26/2013  . Post-traumatic wound infection 10/17/2012  . Cellulitis 10/17/2012  . Atrial fibrillation ([facility]) 10/22/2011   . DVT, lower extremity, distal, chronic ([facility]) 10/22/2011  . Pacemaker 10/19/2011  . Prostate cancer ([facility]) 10/19/2011  . COPD (chronic obstructive pulmonary disease) with emphysema ([place]) 02/04/2011  . Anemia 12/10/2010   Past Medical History:  Diagnosis Date  . Abnormality of gait 06/26/2015  . Anemia    occ. injections for this at [facility]  . Arthritis    "back"  . Chronic lower back pain   . COPD (chronic obstructive pulmonary disease) ([facility]) 10/19/2011   "touch"  . Dizziness and giddiness 06/26/2015  . Exertional dyspnea 10/19/2011  . Fall   . GERD (gastroesophageal reflux disease)   . Hearing loss   . History of blood transfusion    "think it was related to pacemaker placement"  . Hypertension   . Leg swelling 10/19/2011   LLE  . OSA on CPAP 10/19/2011   "sometimes wear CPAP"  . Pacemaker    Medtronic  . Prostate cancer ([facility])    S/P radiation tx ~ 2008  . Skin change    12-24-14 few scalp lesions burned off"old age spots"    Family History  Problem Relation Age of Onset  . Heart disease Father   . Heart disease Mother   . Colon cancer Mother   . Stroke Mother   . Heart attack Neg Hx   . Hypertension Neg Hx     Past Surgical History:  Procedure Laterality Date  . BALLOON DILATION N/A 01/14/2015   Procedure: BALLOON DILATION;  Surgeon: Garlan Fair, MD;  Location: [facility];  Service: Endoscopy;  Laterality: N/A;  . CARDIAC CATHETERIZATION    . CARPAL TUNNEL RELEASE Left 11/14/2014   Procedure: CARPAL TUNNEL RELEASE LEFT THUMB;  Surgeon: Daryll Brod, MD;  Location: [facility];  Service: Orthopedics;  Laterality: Left;  ANESTHESIA:  IV REGIONAL FAB  . CATARACT EXTRACTION W/ INTRAOCULAR LENS  IMPLANT, BILATERAL    . CHOLECYSTECTOMY  10/23/2011  . CHOLECYSTECTOMY  10/23/2011   Procedure: CHOLECYSTECTOMY;  Surgeon: Rolm Bookbinder, MD;  Location: [facility];  Service: General;  Laterality: N/A;  with intraoperative cholangiogram  .  ESOPHAGOGASTRODUODENOSCOPY (EGD) WITH PROPOFOL N/A 01/14/2015   Procedure: ESOPHAGOGASTRODUODENOSCOPY (EGD) WITH PROPOFOL;  Surgeon: Garlan Fair, MD;  [facility];  Service: Endoscopy;  Laterality: N/A;  . EYE SURGERY Bilateral    cataract  . HERNIA REPAIR  ~ 2010;    lap; VHR  . HERNIA REPAIR  ~ 4196   lap vh complicated by recurrence  . HERNIA REPAIR  09/2011   open; VHR  . I&D EXTREMITY Left 10/20/2012   Procedure: LEFT LEG IRRIGATION AND DEBRIDEMENT, AND WOUND VAC APPLICATION;  Surgeon: Marianna Payment, MD;  [facility];  Service: Orthopedics;  Laterality: Left;  . I&D EXTREMITY Left 10/22/2012   Procedure: IRRIGATION AND DEBRIDEMENT EXTREMITY;  Surgeon: Marianna Payment, MD;  [facility];  Service: Orthopedics;  Laterality: Left;  . INSERT / REPLACE / REMOVE PACEMAKER  ~  2009   initial placement  . JOINT REPLACEMENT    . ORIF ELBOW FRACTURE Left 03/29/2015   Procedure: LEFT ELBOW OPEN REDUCTION INTERNAL FIXATION (ORIF) DISTAL HUMERUS FRACTURE WITH OLECRANON OSTEOTOMY AND ULNAR NERVE RELEASE;  Surgeon: Roseanne Kaufman, MD;  Location: [facility];  Service: Orthopedics;  Laterality: Left;  . SKIN SPLIT GRAFT Left 10/22/2012   Procedure: SKIN GRAFT SPLIT THICKNESS;  Surgeon: Marianna Payment, MD;  [facility];  Service: Orthopedics;  Laterality: Left;  . TOTAL KNEE ARTHROPLASTY  1990's   right   Social History   Occupational History  . 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 Smoking status: Former Smoker    Packs/day: 1.50    Years: 35.00    Pack years: 52.50    Types: Cigarettes    Last attempt to quit: 02/15/1973    Years since quitting: 44.2  . Smokeless tobacco: Never Used  Substance and Sexual Activity  . Alcohol use: No  . Drug use: No  . Sexual activity: No

## 2017-04-26 DIAGNOSIS — M810 Age-related osteoporosis without current pathological fracture: Secondary | ICD-10-CM | POA: Diagnosis not present

## 2017-04-27 DIAGNOSIS — Z79891 Long term (current) use of opiate analgesic: Secondary | ICD-10-CM | POA: Diagnosis not present

## 2017-04-27 DIAGNOSIS — G894 Chronic pain syndrome: Secondary | ICD-10-CM | POA: Diagnosis not present

## 2017-05-04 ENCOUNTER — Ambulatory Visit (INDEPENDENT_AMBULATORY_CARE_PROVIDER_SITE_OTHER): Payer: Medicare Other | Admitting: Orthopedic Surgery

## 2017-05-04 ENCOUNTER — Telehealth (INDEPENDENT_AMBULATORY_CARE_PROVIDER_SITE_OTHER): Payer: Self-pay | Admitting: Radiology

## 2017-05-04 NOTE — Telephone Encounter (Signed)
Patient calls asking for cd of lumbar xrays.  Will have this ready for him after 12 noon today.

## 2017-05-06 ENCOUNTER — Ambulatory Visit (INDEPENDENT_AMBULATORY_CARE_PROVIDER_SITE_OTHER): Payer: Medicare Other | Admitting: Surgery

## 2017-05-06 ENCOUNTER — Encounter: Payer: Self-pay | Admitting: Surgery

## 2017-05-06 ENCOUNTER — Other Ambulatory Visit: Payer: Self-pay

## 2017-05-06 VITALS — BP 112/65 | HR 78 | Temp 97.8°F | Resp 18 | Ht 70.0 in | Wt 173.0 lb

## 2017-05-06 DIAGNOSIS — I714 Abdominal aortic aneurysm, without rupture, unspecified: Secondary | ICD-10-CM

## 2017-05-06 NOTE — Progress Notes (Signed)
[facility]  Patient name: [name] [name] MRN: [id number] DOB: 13-Sep-1924 Sex: male   REQUESTING PROVIDER:    Dr. Sharol Given   REASON FOR CONSULT:    AAA  HISTORY OF PRESENT ILLNESS:   [name] [name] is a 84 y.o. male, who is referred for evaluation of a abdominal aortic aneurysm.  This was detected on lumbar spine x-rays.  He denies abdominal pain or back pain.  He is medically managed for hypertension.  He suffers from COPD with shortness of breath.  He has a history of atrial fibrillation but does not take anticoagulation.  PAST MEDICAL HISTORY    Past Medical History:  Diagnosis Date  . Abnormality of gait 06/26/2015  . Anemia    occ. injections for this at [facility]  . Arthritis    "back"  . Chronic lower back pain   . COPD (chronic obstructive pulmonary disease) ([facility]) 10/19/2011   "touch"  . Dizziness and giddiness 06/26/2015  . Exertional dyspnea 10/19/2011  . Fall   . GERD (gastroesophageal reflux disease)   . Hearing loss   . History of blood transfusion    "think it was related to pacemaker placement"  . Hypertension   . Leg swelling 10/19/2011   LLE  . OSA on CPAP 10/19/2011   "sometimes wear CPAP"  . Pacemaker    Medtronic  . Prostate cancer ([facility])    S/P radiation tx ~ 2008  . Skin change    12-24-14 few scalp lesions burned off"old age spots"     FAMILY HISTORY   Family History  Problem Relation Age of Onset  . Heart disease Father   . Heart disease Mother   . Colon cancer Mother   . Stroke Mother   . Heart attack Neg Hx   . Hypertension Neg Hx     SOCIAL HISTORY:   Social History   Socioeconomic History  . Marital status: Married    Spouse name: martha  . Number of children: 3  . Years of education: 78  . Highest education level: Not on file  Occupational History  . 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  Financial resource strain: Not on file  . Food insecurity:    Worry: Not on file    Inability: Not on file  . Transportation needs:    Medical: Not on file    Non-medical: Not on file  Tobacco Use  . Smoking status: Former Smoker    Packs/day: 1.50    Years: 35.00    Pack years: 52.50    Types: Cigarettes    Last attempt to quit: 02/15/1973    Years since quitting: 44.2  . Smokeless tobacco: Never Used  Substance and Sexual Activity  . Alcohol use: No  . Drug use: No  . Sexual activity: Never  Lifestyle  . Physical activity:    Days per week: Not on file    Minutes per session: Not on file  . Stress: Not on file  Relationships  . Social connections:    Talks on phone: Not on file    Gets together: Not on file    Attends religious service: Not on file    Active member of club or organization: Not on file    Attends meetings of clubs or organizations: Not on file    Relationship status: Not on file  . Intimate partner violence:  Fear of current or ex partner: Not on file    Emotionally abused: Not on file    Physically abused: Not on file    Forced sexual activity: Not on file  Other Topics Concern  . Not on file  Social History Narrative   Lives at home w/ his wife, [name] [name]   Right-handed   Drinks about 1 soda per day, decaf coffee    ALLERGIES:    No Known Allergies  CURRENT MEDICATIONS:    Current Outpatient Medications  Medication Sig Dispense Refill  . albuterol (PROVENTIL HFA;VENTOLIN HFA) 108 (90 Base) MCG/ACT inhaler Inhale 2 puffs into the lungs every 6 (six) hours as needed for wheezing or shortness of breath. 1 Inhaler 6  . aspirin EC 81 MG tablet Take 81 mg by mouth daily.    . calcium carbonate 100 mg/ml SUSP Take 600 mg by mouth.    . furosemide (LASIX) 20 MG tablet Take 20 mg by mouth See admin instructions. Take 40mg  daily.  May take an additional 40mg  for leg swelling    . lisinopril (PRINIVIL,ZESTRIL) 20 MG tablet Take 0.5 tablets (10 mg  total) by mouth daily. 45 tablet 3  . Multiple Vitamins-Minerals (PRESERVISION AREDS PO) Take 1 tablet by mouth 2 (two) times daily.    Marland Kitchen omeprazole (PRILOSEC) 20 MG capsule Take 20 mg by mouth daily.     Marland Kitchen oxybutynin (DITROPAN XL) 15 MG 24 hr tablet Take 15 mg by mouth 2 (two) times a week.     Marland Kitchen oxyCODONE-acetaminophen (PERCOCET/ROXICET) 5-325 MG tablet Take 1 tablet by mouth every 6 (six) hours as needed for severe pain.    . predniSONE (DELTASONE) 10 MG tablet TAKE ONE TABLET BY MOUTH DAILY WITH BREAKFAST 30 tablet 0  . Umeclidinium-Vilanterol (ANORO ELLIPTA) 62.5-25 MCG/INH AEPB Inhale 1 puff into the lungs daily.     . vitamin B-12 (CYANOCOBALAMIN) 500 MCG tablet Take 500 mcg by mouth daily.     . mupirocin ointment (BACTROBAN) 2 % Apply 1 application topically 2 (two) times daily. (Patient not taking: Reported on 05/06/2017) 22 g 6   No current facility-administered medications for this visit.     REVIEW OF SYSTEMS:   [X]  denotes positive finding, [ ]  denotes negative finding Cardiac  Comments:  Chest pain or chest pressure:    Shortness of breath upon exertion: x   Short of breath when lying flat:    Irregular heart rhythm: x       Vascular    Pain in calf, thigh, or hip brought on by ambulation:    Pain in feet at night that wakes you up from your sleep:     Blood clot in your veins:    Leg swelling:  x       Pulmonary    Oxygen at home:    Productive cough:     Wheezing:         Neurologic    Sudden weakness in arms or legs:     Sudden numbness in arms or legs:     Sudden onset of difficulty speaking or slurred speech:    Temporary loss of vision in one eye:     Problems with dizziness:  x       Gastrointestinal    Blood in stool:      Vomited blood:         Genitourinary    Burning when urinating:     Blood in urine:  Psychiatric    Major depression:         Hematologic    Bleeding problems:    Problems with blood clotting too easily:          Skin    Rashes or ulcers:        Constitutional    Fever or chills:     PHYSICAL EXAM:   Vitals:   05/06/17 1325  BP: 112/65  Pulse: 78  Resp: 18  Temp: 97.8 F (36.6 C)  TempSrc: Oral  SpO2: 96%  Weight: 173 lb (78.5 kg)  Height: 5\' 10"  (1.778 m)    GENERAL: The patient is a well-nourished male, in no acute distress. The vital signs are documented above. CARDIAC: There is a regular rate and rhythm.  VASCULAR: Palpable dorsalis pedis pulse bilaterally PULMONARY: Nonlabored respirations ABDOMEN: Soft and non-tender with normal pitched bowel sounds.  MUSCULOSKELETAL: There are no major deformities or cyanosis. NEUROLOGIC: No focal weakness or paresthesias are detected. SKIN: There are no ulcers or rashes noted. PSYCHIATRIC: The patient has a normal affect.  STUDIES:   The patient has a CT scan from December 2018 which shows no evidence of aneurysmal disease  ASSESSMENT and PLAN   X-ray study suggesting abdominal aortic aneurysm I believe are an accurate, as the patient has a CT scan from 3-4 months ago which show a normal caliber aorta.  No intervention or follow-up is recommended.  This was discussed with the patient and his wife.  All questions were answered.   Annamarie Major, MD [facility] [phone] Pager [phone]

## 2017-05-12 ENCOUNTER — Telehealth: Payer: Self-pay | Admitting: Physician Assistant

## 2017-05-12 ENCOUNTER — Encounter: Payer: Medicare Other | Admitting: *Deleted

## 2017-05-12 NOTE — Telephone Encounter (Signed)
New Message ° ° ° °1. Has your device fired? no ° °2. Is you device beeping? no ° °3. Are you experiencing draining or swelling at device site? no ° °4. Are you calling to see if we received your device transmission? yes ° °5. Have you passed out? no ° ° ° °Please route to Device Clinic Pool ° °

## 2017-05-12 NOTE — Telephone Encounter (Signed)
Called home and cell number left VM on mobile informing pt that transmission was not received and for pt to try and send it again and to call device clinic if he was having issues, direct number to device clinic given.

## 2017-05-13 ENCOUNTER — Encounter: Payer: Self-pay | Admitting: Cardiology

## 2017-05-13 NOTE — Telephone Encounter (Signed)
LVM for call back to assist with remote transmission.

## 2017-05-13 NOTE — Telephone Encounter (Signed)
[name] [name] called tech support- they are ordering a new monitor. R/S for remote transmission 05/30/17. He verbalizes understanding. Direct # given for [facility].

## 2017-05-16 ENCOUNTER — Other Ambulatory Visit (INDEPENDENT_AMBULATORY_CARE_PROVIDER_SITE_OTHER): Payer: Self-pay | Admitting: Orthopedic Surgery

## 2017-05-16 DIAGNOSIS — M48061 Spinal stenosis, lumbar region without neurogenic claudication: Secondary | ICD-10-CM | POA: Diagnosis not present

## 2017-05-16 DIAGNOSIS — R5383 Other fatigue: Secondary | ICD-10-CM | POA: Diagnosis not present

## 2017-05-16 DIAGNOSIS — Z79899 Other long term (current) drug therapy: Secondary | ICD-10-CM | POA: Diagnosis not present

## 2017-05-16 DIAGNOSIS — D649 Anemia, unspecified: Secondary | ICD-10-CM | POA: Diagnosis not present

## 2017-05-16 DIAGNOSIS — M150 Primary generalized (osteo)arthritis: Secondary | ICD-10-CM | POA: Diagnosis not present

## 2017-05-16 DIAGNOSIS — R2689 Other abnormalities of gait and mobility: Secondary | ICD-10-CM | POA: Diagnosis not present

## 2017-05-16 DIAGNOSIS — I714 Abdominal aortic aneurysm, without rupture: Secondary | ICD-10-CM | POA: Diagnosis not present

## 2017-05-17 ENCOUNTER — Inpatient Hospital Stay: Payer: Medicare Other

## 2017-05-17 ENCOUNTER — Inpatient Hospital Stay: Payer: Medicare Other | Attending: Oncology | Admitting: Oncology

## 2017-05-17 ENCOUNTER — Telehealth: Payer: Self-pay | Admitting: Oncology

## 2017-05-17 VITALS — BP 148/66 | HR 80 | Temp 98.7°F | Resp 18 | Ht 70.0 in | Wt 175.1 lb

## 2017-05-17 DIAGNOSIS — Z8546 Personal history of malignant neoplasm of prostate: Secondary | ICD-10-CM | POA: Diagnosis not present

## 2017-05-17 DIAGNOSIS — N189 Chronic kidney disease, unspecified: Secondary | ICD-10-CM | POA: Diagnosis not present

## 2017-05-17 DIAGNOSIS — Z7982 Long term (current) use of aspirin: Secondary | ICD-10-CM | POA: Diagnosis not present

## 2017-05-17 DIAGNOSIS — D649 Anemia, unspecified: Secondary | ICD-10-CM | POA: Diagnosis not present

## 2017-05-17 DIAGNOSIS — D500 Iron deficiency anemia secondary to blood loss (chronic): Secondary | ICD-10-CM

## 2017-05-17 DIAGNOSIS — Z79899 Other long term (current) drug therapy: Secondary | ICD-10-CM | POA: Insufficient documentation

## 2017-05-17 LAB — CBC WITH DIFFERENTIAL/PLATELET
BASOS PCT: 1 %
Basophils Absolute: 0.1 10*3/uL (ref 0.0–0.1)
EOS ABS: 0.3 10*3/uL (ref 0.0–0.5)
Eosinophils Relative: 5 %
HCT: 33.2 % — ABNORMAL LOW (ref 38.4–49.9)
Hemoglobin: 11 g/dL — ABNORMAL LOW (ref 13.0–17.1)
LYMPHS ABS: 0.9 10*3/uL (ref 0.9–3.3)
Lymphocytes Relative: 13 %
MCH: 32.8 pg (ref 27.2–33.4)
MCHC: 33 g/dL (ref 32.0–36.0)
MCV: 99.3 fL — ABNORMAL HIGH (ref 79.3–98.0)
Monocytes Absolute: 0.4 10*3/uL (ref 0.1–0.9)
Monocytes Relative: 6 %
NEUTROS ABS: 5.6 10*3/uL (ref 1.5–6.5)
NEUTROS PCT: 75 %
Platelets: 148 10*3/uL (ref 140–400)
RBC: 3.35 MIL/uL — AB (ref 4.20–5.82)
RDW: 15.1 % — ABNORMAL HIGH (ref 11.0–14.6)
WBC: 7.4 10*3/uL (ref 4.0–10.3)

## 2017-05-17 NOTE — Progress Notes (Signed)
Hematology and Oncology Follow Up Visit  [name] [name] [id number] [birth year] 84 y.o. 05/17/2017 11:33 AM  CC: [name] [name], M.D.  [name] [name], M.D.    Principle Diagnosis: 84 year old man with the:   1. Anemia related to chronic renal insufficiency as well as chronic disease. 2. History of prostate cancer status post radiation therapy completed in 2008.  Current therapy: Active surveillance.   Interim History: [name] [name] is here for a follow-up.  Since last visit, he reports no major changes in his health.  He is dealing with macular degeneration which caused visual problems for him and is not able to drive.  He denies any worsening back pain or urination difficulties.  He denies any excessive fatigue or tiredness.  He denies any shortness of breath or dyspnea on exertion.  He denies any hematochezia or melena.  He does not report any headaches or blurry vision or seizure. He does not report any fevers, chills or sweats. He reports no chest pain or palpitation. He has not reported any wheezing or shortness of breath. Does have coronary nausea or vomiting or abdominal pain.  He denies any frequency urgency or hesitancy.  He does not report any skin rashes or lesions.  No lymphadenopathy or petechiae.  Remainder of his review of systems is negative.  Medications: I have reviewed the patient's current medications. Current Outpatient Medications  Medication Sig Dispense Refill  . albuterol (PROVENTIL HFA;VENTOLIN HFA) 108 (90 Base) MCG/ACT inhaler Inhale 2 puffs into the lungs every 6 (six) hours as needed for wheezing or shortness of breath. 1 Inhaler 6  . aspirin EC 81 MG tablet Take 81 mg by mouth daily.    . calcium carbonate 100 mg/ml SUSP Take 600 mg by mouth.    . furosemide (LASIX) 20 MG tablet Take 20 mg by mouth See admin instructions. Take 40mg  daily.  May take an additional 40mg  for leg swelling    . lisinopril (PRINIVIL,ZESTRIL) 20 MG tablet Take 0.5 tablets (10 mg  total) by mouth daily. 45 tablet 3  . Multiple Vitamins-Minerals (PRESERVISION AREDS PO) Take 1 tablet by mouth 2 (two) times daily.    . mupirocin ointment (BACTROBAN) 2 % Apply 1 application topically 2 (two) times daily. (Patient not taking: Reported on 05/06/2017) 22 g 6  . omeprazole (PRILOSEC) 20 MG capsule Take 20 mg by mouth daily.     Marland Kitchen oxybutynin (DITROPAN XL) 15 MG 24 hr tablet Take 15 mg by mouth 2 (two) times a week.     Marland Kitchen oxyCODONE-acetaminophen (PERCOCET/ROXICET) 5-325 MG tablet Take 1 tablet by mouth every 6 (six) hours as needed for severe pain.    . predniSONE (DELTASONE) 10 MG tablet TAKE ONE TABLET BY MOUTH DAILY WITH BREAKFAST 30 tablet 0  . Umeclidinium-Vilanterol (ANORO ELLIPTA) 62.5-25 MCG/INH AEPB Inhale 1 puff into the lungs daily.     . vitamin B-12 (CYANOCOBALAMIN) 500 MCG tablet Take 500 mcg by mouth daily.      No current facility-administered medications for this visit.     Allergies: No Known Allergies  Past Medical History, Surgical history, Social history, and Family History were reviewed and updated.    Physical Exam: Blood pressure (!) 148/66, pulse 80, temperature 98.7 F (37.1 C), temperature source Oral, resp. rate 18, height 5\' 10"  (1.778 m), weight 175 lb 1.6 oz (79.4 kg), SpO2 99 %.   ECOG: 1 General appearance: Alert, awake gentleman without distress. Head: Atraumatic without abnormalities. Oropharynx: Without any thrush or ulcers. Eyes:  No scleral icterus. Lymph nodes: Cervical, supraclavicular, and axillary nodes normal. Heart:regular rate and rhythm, S1, S2 normal, no murmur, click, rub or gallop Lung: Clear without any rhonchi, wheezes or dullness to percussion. Abdomen: Soft, nontender without any rebound or guarding. Musculoskeletal: No joint deformity or effusion. Skin: Ecchymosis noted without petechiae or rash.   Lab Results: Lab Results  Component Value Date   WBC 7.4 05/17/2017   HGB 11.0 (L) 05/17/2017   HCT 33.2 (L)  05/17/2017   MCV 99.3 (H) 05/17/2017   PLT 148 05/17/2017     Chemistry      Component Value Date/Time   NA 143 05/18/2016 1254   K 4.1 05/18/2016 1254   CL 102 08/06/2015 1454   CO2 24 05/18/2016 1254   BUN 36.4 (H) 05/18/2016 1254   CREATININE 1.4 (H) 05/18/2016 1254      Component Value Date/Time   CALCIUM 9.3 05/18/2016 1254   ALKPHOS 71 05/18/2016 1254   AST 22 05/18/2016 1254   ALT 10 05/18/2016 1254   BILITOT 0.68 05/18/2016 1254       Impression and Plan:  84 year old with: 1. Anemia due to chronic renal insufficiency and chronic disease: His hemoglobin today was reviewed and continues to be stable without any intervention.  He has been on Aranesp in the past and see no need to restart this test at this time.  Risks and benefits of this approach was reviewed today and he is agreeable to defer Aranesp at this time unless his hemoglobin drops further. 2. Prostate cancer.  No evidence of recurrence at this time close to 10 years out of his definitive treatment.  His PSA remains undetectable and followed by Dr. Karsten [name]. 3. Follow-up will be in 12 months.  15  minutes was spent with the patient face-to-face today.  More than 50% of time was dedicated to patient counseling, education and answering questions regarding his diagnosis and plan of care.   Zola Button, MD 4/2/201911:33 AM

## 2017-05-17 NOTE — Telephone Encounter (Signed)
Appointments scheduled AVS/Calendar printed per 4/2 los °

## 2017-05-25 DIAGNOSIS — G894 Chronic pain syndrome: Secondary | ICD-10-CM | POA: Diagnosis not present

## 2017-05-25 DIAGNOSIS — Z79891 Long term (current) use of opiate analgesic: Secondary | ICD-10-CM | POA: Diagnosis not present

## 2017-05-25 DIAGNOSIS — M48061 Spinal stenosis, lumbar region without neurogenic claudication: Secondary | ICD-10-CM | POA: Diagnosis not present

## 2017-05-25 DIAGNOSIS — M546 Pain in thoracic spine: Secondary | ICD-10-CM | POA: Diagnosis not present

## 2017-05-26 DIAGNOSIS — Z8709 Personal history of other diseases of the respiratory system: Secondary | ICD-10-CM | POA: Diagnosis not present

## 2017-05-26 DIAGNOSIS — H547 Unspecified visual loss: Secondary | ICD-10-CM | POA: Diagnosis not present

## 2017-05-26 DIAGNOSIS — H26493 Other secondary cataract, bilateral: Secondary | ICD-10-CM | POA: Insufficient documentation

## 2017-05-26 DIAGNOSIS — Z961 Presence of intraocular lens: Secondary | ICD-10-CM | POA: Diagnosis not present

## 2017-05-26 DIAGNOSIS — H35363 Drusen (degenerative) of macula, bilateral: Secondary | ICD-10-CM | POA: Diagnosis not present

## 2017-05-26 DIAGNOSIS — H353122 Nonexudative age-related macular degeneration, left eye, intermediate dry stage: Secondary | ICD-10-CM | POA: Diagnosis not present

## 2017-05-26 DIAGNOSIS — Z950 Presence of cardiac pacemaker: Secondary | ICD-10-CM | POA: Diagnosis not present

## 2017-05-26 DIAGNOSIS — H353211 Exudative age-related macular degeneration, right eye, with active choroidal neovascularization: Secondary | ICD-10-CM | POA: Diagnosis not present

## 2017-05-30 ENCOUNTER — Ambulatory Visit (INDEPENDENT_AMBULATORY_CARE_PROVIDER_SITE_OTHER): Payer: Medicare Other | Admitting: *Deleted

## 2017-05-30 ENCOUNTER — Telehealth: Payer: Self-pay | Admitting: Cardiology

## 2017-05-30 DIAGNOSIS — I495 Sick sinus syndrome: Secondary | ICD-10-CM | POA: Diagnosis not present

## 2017-05-30 NOTE — Telephone Encounter (Signed)
Spoke with pt and reminded pt of remote transmission that is due today. Pt verbalized understanding.   

## 2017-05-31 NOTE — Progress Notes (Signed)
Remote pacemaker transmission.   

## 2017-06-01 ENCOUNTER — Encounter: Payer: Self-pay | Admitting: Cardiology

## 2017-06-01 LAB — CUP PACEART REMOTE DEVICE CHECK
Battery Remaining Longevity: 27 mo
Brady Statistic AP VS Percent: 69 %
Brady Statistic AS VS Percent: 28 %
Date Time Interrogation Session: 20190415193348
Implantable Lead Implant Date: 20080826
Implantable Lead Location: 753859
Implantable Pulse Generator Implant Date: 20080826
Lead Channel Impedance Value: 572 Ohm
Lead Channel Pacing Threshold Amplitude: 0.75 V
Lead Channel Pacing Threshold Amplitude: 1.25 V
Lead Channel Pacing Threshold Pulse Width: 0.4 ms
Lead Channel Setting Pacing Amplitude: 1.5 V
Lead Channel Setting Pacing Amplitude: 2.5 V
Lead Channel Setting Pacing Pulse Width: 0.4 ms
Lead Channel Setting Sensing Sensitivity: 2 mV
MDC IDC LEAD IMPLANT DT: 20080826
MDC IDC LEAD LOCATION: 753860
MDC IDC MSMT BATTERY IMPEDANCE: 1931 Ohm
MDC IDC MSMT BATTERY VOLTAGE: 2.71 V
MDC IDC MSMT LEADCHNL RA IMPEDANCE VALUE: 411 Ohm
MDC IDC MSMT LEADCHNL RA PACING THRESHOLD PULSEWIDTH: 0.4 ms
MDC IDC MSMT LEADCHNL RA SENSING INTR AMPL: 1.4 mV
MDC IDC MSMT LEADCHNL RV SENSING INTR AMPL: 5.6 mV
MDC IDC STAT BRADY AP VP PERCENT: 2 %
MDC IDC STAT BRADY AS VP PERCENT: 1 %

## 2017-06-02 DIAGNOSIS — R310 Gross hematuria: Secondary | ICD-10-CM | POA: Diagnosis not present

## 2017-06-02 DIAGNOSIS — Z8546 Personal history of malignant neoplasm of prostate: Secondary | ICD-10-CM | POA: Diagnosis not present

## 2017-06-07 DIAGNOSIS — Z79899 Other long term (current) drug therapy: Secondary | ICD-10-CM | POA: Diagnosis not present

## 2017-06-07 DIAGNOSIS — I714 Abdominal aortic aneurysm, without rupture: Secondary | ICD-10-CM | POA: Diagnosis not present

## 2017-06-07 DIAGNOSIS — N200 Calculus of kidney: Secondary | ICD-10-CM | POA: Diagnosis not present

## 2017-06-07 DIAGNOSIS — R5383 Other fatigue: Secondary | ICD-10-CM | POA: Diagnosis not present

## 2017-06-07 DIAGNOSIS — R310 Gross hematuria: Secondary | ICD-10-CM | POA: Diagnosis not present

## 2017-06-07 DIAGNOSIS — R2689 Other abnormalities of gait and mobility: Secondary | ICD-10-CM | POA: Diagnosis not present

## 2017-06-07 DIAGNOSIS — M150 Primary generalized (osteo)arthritis: Secondary | ICD-10-CM | POA: Diagnosis not present

## 2017-06-07 DIAGNOSIS — D649 Anemia, unspecified: Secondary | ICD-10-CM | POA: Diagnosis not present

## 2017-06-07 DIAGNOSIS — M48061 Spinal stenosis, lumbar region without neurogenic claudication: Secondary | ICD-10-CM | POA: Diagnosis not present

## 2017-06-14 ENCOUNTER — Encounter: Payer: Self-pay | Admitting: Interventional Cardiology

## 2017-06-14 ENCOUNTER — Ambulatory Visit (INDEPENDENT_AMBULATORY_CARE_PROVIDER_SITE_OTHER): Payer: Medicare Other | Admitting: Interventional Cardiology

## 2017-06-14 VITALS — BP 104/56 | HR 64 | Ht 70.0 in | Wt 176.4 lb

## 2017-06-14 DIAGNOSIS — I480 Paroxysmal atrial fibrillation: Secondary | ICD-10-CM | POA: Diagnosis not present

## 2017-06-14 DIAGNOSIS — I10 Essential (primary) hypertension: Secondary | ICD-10-CM | POA: Diagnosis not present

## 2017-06-14 DIAGNOSIS — Z950 Presence of cardiac pacemaker: Secondary | ICD-10-CM | POA: Diagnosis not present

## 2017-06-14 NOTE — Patient Instructions (Signed)

## 2017-06-14 NOTE — Progress Notes (Signed)
Cardiology Office Note   Date:  06/14/2017   ID:  [name] [name], DOB 1924-02-16, MRN [id number]  PCP:  [name], MD    No chief complaint on file. AFib   Wt Readings from Last 3 Encounters:  06/14/17 176 lb 6.4 oz (80 kg)  05/17/17 175 lb 1.6 oz (79.4 kg)  05/06/17 173 lb (78.5 kg)       History of Present Illness: [name] [name] is a 84 y.o. male  who had gallstone pancreatitis and subsequent cholecystectomy in 2013. He has a h/o PAF and HTN.  He has had chronic leg pain- neuropathy. Also has decreased balance.  His son recently had a STEMI when I was on call in 2019.  He was medically managed.  THe patient has tried hard to avoid falls.   Denies : Chest pain. Dizziness. Nitroglycerin use. Orthopnea. Palpitations. Paroxysmal nocturnal dyspnea. Shortness of breath. Syncope.     Past Medical History:  Diagnosis Date  . Abnormality of gait 06/26/2015  . Anemia    occ. injections for this at [facility]  . Arthritis    "back"  . Chronic lower back pain   . COPD (chronic obstructive pulmonary disease) ([place]) 10/19/2011   "touch"  . Dizziness and giddiness 06/26/2015  . Exertional dyspnea 10/19/2011  . Fall   . GERD (gastroesophageal reflux disease)   . Hearing loss   . History of blood transfusion    "think it was related to pacemaker placement"  . Hypertension   . Leg swelling 10/19/2011   LLE  . OSA on CPAP 10/19/2011   "sometimes wear CPAP"  . Pacemaker    [employer]onic  . Prostate cancer ([place])    S/P radiation tx ~ 2008  . Skin change    12-24-14 few scalp lesions burned off"old age spots"    Past Surgical History:  Procedure Laterality Date  . BALLOON DILATION N/A 01/14/2015   Procedure: BALLOON DILATION;  Surgeon: Garlan Fair, MD;  Location: [facility];  Service: Endoscopy;  Laterality: N/A;  . CARDIAC CATHETERIZATION    . CARPAL TUNNEL RELEASE Left 11/14/2014   Procedure: CARPAL TUNNEL RELEASE LEFT THUMB;  Surgeon: Daryll Brod, MD;   Location: [facility];  Service: Orthopedics;  Laterality: Left;  ANESTHESIA:  IV REGIONAL FAB  . CATARACT EXTRACTION W/ INTRAOCULAR LENS  IMPLANT, BILATERAL    . CHOLECYSTECTOMY  10/23/2011  . CHOLECYSTECTOMY  10/23/2011   Procedure: CHOLECYSTECTOMY;  Surgeon: Rolm Bookbinder, MD;  Location: [facility];  Service: General;  Laterality: N/A;  with intraoperative cholangiogram  . ESOPHAGOGASTRODUODENOSCOPY (EGD) WITH PROPOFOL N/A 01/14/2015   Procedure: ESOPHAGOGASTRODUODENOSCOPY (EGD) WITH PROPOFOL;  Surgeon: Garlan Fair, MD;  Location: [facility];  Service: Endoscopy;  Laterality: N/A;  . EYE SURGERY Bilateral    cataract  . HERNIA REPAIR  ~ 2010;    lap; VHR  . HERNIA REPAIR  ~ 3419   lap vh complicated by recurrence  . HERNIA REPAIR  09/2011   open; VHR  . I&D EXTREMITY Left 10/20/2012   Procedure: LEFT LEG IRRIGATION AND DEBRIDEMENT, AND WOUND VAC APPLICATION;  Surgeon: Marianna Payment, MD;  Location: [facility];  Service: Orthopedics;  Laterality: Left;  . I&D EXTREMITY Left 10/22/2012   Procedure: IRRIGATION AND DEBRIDEMENT EXTREMITY;  Surgeon: Marianna Payment, MD;  Location: [facility];  Service: Orthopedics;  Laterality: Left;  . INSERT / REPLACE / REMOVE PACEMAKER  ~ 2009   initial placement  .  JOINT REPLACEMENT    . ORIF ELBOW FRACTURE Left 03/29/2015   Procedure: LEFT ELBOW OPEN REDUCTION INTERNAL FIXATION (ORIF) DISTAL HUMERUS FRACTURE WITH OLECRANON OSTEOTOMY AND ULNAR NERVE RELEASE;  Surgeon: Roseanne Kaufman, MD;  Location: [facility];  Service: Orthopedics;  Laterality: Left;  . SKIN SPLIT GRAFT Left 10/22/2012   Procedure: SKIN GRAFT SPLIT THICKNESS;  Surgeon: Marianna Payment, MD;  Location: [facility];  Service: Orthopedics;  Laterality: Left;  . TOTAL KNEE ARTHROPLASTY  1990's   right     Current Outpatient Medications  Medication Sig Dispense Refill  . acetaminophen (TYLENOL) 650 MG CR tablet Take 650 mg by mouth 2 (two) times daily.    Marland Kitchen albuterol (PROVENTIL  HFA;VENTOLIN HFA) 108 (90 Base) MCG/ACT inhaler Inhale 2 puffs into the lungs every 6 (six) hours as needed for wheezing or shortness of breath. 1 Inhaler 6  . aspirin EC 81 MG tablet Take 81 mg by mouth daily.    . calcium carbonate 100 mg/ml SUSP Take 600 mg by mouth.    . ferrous sulfate 325 (65 FE) MG tablet Take 325 mg by mouth 3 (three) times daily with meals.    . furosemide (LASIX) 20 MG tablet Take 20 mg by mouth See admin instructions. Take 40mg  daily.  May take an additional 40mg  for leg swelling    . lisinopril (PRINIVIL,ZESTRIL) 20 MG tablet Take 0.5 tablets (10 mg total) by mouth daily. 45 tablet 3  . Multiple Vitamins-Minerals (PRESERVISION AREDS PO) Take 1 tablet by mouth 2 (two) times daily.    . mupirocin ointment (BACTROBAN) 2 % Apply 1 application topically 2 (two) times daily. 22 g 6  . omeprazole (PRILOSEC) 20 MG capsule Take 20 mg by mouth daily.     Marland Kitchen oxybutynin (DITROPAN XL) 15 MG 24 hr tablet Take 15 mg by mouth 2 (two) times a week.     Marland Kitchen oxyCODONE-acetaminophen (PERCOCET/ROXICET) 5-325 MG tablet Take 1 tablet by mouth every 6 (six) hours as needed for severe pain.    . predniSONE (DELTASONE) 10 MG tablet TAKE ONE TABLET BY MOUTH DAILY WITH BREAKFAST 30 tablet 0  . Umeclidinium-Vilanterol (ANORO ELLIPTA) 62.5-25 MCG/INH AEPB Inhale 1 puff into the lungs daily.     . vitamin B-12 (CYANOCOBALAMIN) 500 MCG tablet Take 500 mcg by mouth daily.      No current facility-administered medications for this visit.     Allergies:   Patient has no known allergies.    Social History:  The patient  reports that he quit smoking about 44 years ago. His smoking use included cigarettes. He has a 52.50 pack-year smoking history. He has never used smokeless tobacco. He reports that he does not drink alcohol or use drugs.   Family History:  The patient's family history includes Colon cancer in his mother; Heart disease in his father and mother; Stroke in his mother.    ROS:  Please see  the history of present illness.   Otherwise, review of systems are positive for decreased balance.   All other systems are reviewed and negative.    PHYSICAL EXAM: VS:  BP (!) 104/56   Pulse 64   Ht 5\' 10"  (1.778 m)   Wt 176 lb 6.4 oz (80 kg)   SpO2 96%   BMI 25.31 kg/m  , BMI Body mass index is 25.31 kg/m. GEN: Well nourished, well developed, in no acute distress  HEENT: normal  Neck: no JVD, carotid bruits, or masses Cardiac: RRR, premature beats; no  murmurs, rubs, or gallops,; bilateral pitting edema  Respiratory:  clear to auscultation bilaterally, normal work of breathing GI: soft, nontender, nondistended, + BS MS: no deformity or atrophy  Skin: warm and dry, no rash Neuro:  Strength and sensation are intact Psych: euthymic mood, full affect     Recent Labs: 05/17/2017: Hemoglobin 11.0; Platelets 148   Lipid Panel No results found for: CHOL, TRIG, HDL, CHOLHDL, VLDL, LDLCALC, LDLDIRECT   Other studies Reviewed: Additional studies/ records that were reviewed today with results demonstrating: prior echo reviewed; labs reviewed.  Lipids controlled in 2016.   ASSESSMENT AND PLAN:  1. AFib: He again declines anticoagualtion again.  I think this is reasonable.  He has also had some hematuria recently and is being evaluated by urology for a bladder polyp. 2. Abnormal echo: prior inferior hypokinesis noted, but no sx.  Normal stress test in the past. 3. Pacer: F/u with Dr. Caryl Comes.  4. Lipids: To be checked with Dr. Jacelyn Grip.  Has been controlled in the past. 5. HTN: COntrolled for the most part.  Home readings reviewed.    Current medicines are reviewed at length with the patient today.  The patient concerns regarding his medicines were addressed.  The following changes have been made:  No change  Labs/ tests ordered today include:  No orders of the defined types were placed in this encounter.   Recommend 150 minutes/week of aerobic exercise Low fat, low carb, high fiber  diet recommended  Disposition:   FU in 1 year   Signed, Larae Grooms, MD  06/14/2017 3:00 PM    [address], [place], [place]  [address] Phone: [phone]; Fax: [phone]

## 2017-06-16 DIAGNOSIS — R8271 Bacteriuria: Secondary | ICD-10-CM | POA: Diagnosis not present

## 2017-06-16 DIAGNOSIS — R310 Gross hematuria: Secondary | ICD-10-CM | POA: Diagnosis not present

## 2017-06-16 DIAGNOSIS — N200 Calculus of kidney: Secondary | ICD-10-CM | POA: Diagnosis not present

## 2017-06-23 ENCOUNTER — Other Ambulatory Visit: Payer: Self-pay | Admitting: Urology

## 2017-06-29 ENCOUNTER — Telehealth (INDEPENDENT_AMBULATORY_CARE_PROVIDER_SITE_OTHER): Payer: Self-pay | Admitting: Radiology

## 2017-06-29 NOTE — Telephone Encounter (Signed)
Patient called triage line and said that the [name] [name] at [facility] has sent [place] request for refill of prednisone four times.  He is requesting a refill and change it to a 90 day supply, please call him to advise, thanks.

## 2017-06-30 ENCOUNTER — Other Ambulatory Visit (INDEPENDENT_AMBULATORY_CARE_PROVIDER_SITE_OTHER): Payer: Self-pay

## 2017-06-30 MED ORDER — PREDNISONE 10 MG PO TABS: 10.0000 mg | ORAL_TABLET | Freq: Every day | ORAL | 0 refills | Status: DC

## 2017-06-30 NOTE — Telephone Encounter (Signed)
Can not do 90 day verbal ok for 1 month from Dr. Sharol Given sent to pharm.

## 2017-07-13 ENCOUNTER — Other Ambulatory Visit: Payer: Self-pay

## 2017-07-13 ENCOUNTER — Encounter (HOSPITAL_BASED_OUTPATIENT_CLINIC_OR_DEPARTMENT_OTHER): Payer: Self-pay | Admitting: *Deleted

## 2017-07-13 NOTE — Progress Notes (Signed)
Spoke w/ pt via phone for pre-op interview.  Npo after mn.  Arrive at [facility].  Needs istat 8.  Current ekg in chart and epic.  Will take prednisone, prilosec, and tylenol am dos w/ sips of water.   Last pace check , dated 05-30-2017, in epic.  lov cardiologist note , dated 06-14-2017 in chart and epic.

## 2017-07-14 NOTE — H&P (Signed)
HPI: [name] [name] is a 84 year-old male with a recently discovered bladder lesion.  He did see the blood in his urine. He first noticed the symptoms approximately 06/01/2017. He has seen blood clots.   He does not have a burning sensation when he urinates. He is currently having trouble urinating.   He is not having pain. He has not recently had unwanted weight loss.   06/02/17: He did pass 1 clot last pm but today has been able to void w/o difficulty.   06/16/17: His gross hematuria has persisted. He is not having any pain. He is able to void without difficulty. He was taking aspirin but has stopped that.     ALLERGIES: None   MEDICATIONS: Aspirin 81 mg tablet,chewable  Lisinopril  Omeprazole  Oxybutynin Chloride  Albuterol Sulfate  Anoro Ellipta  Aranesp  Furosemide  Iron  Preservision Areds  Vitamin B 12     GU PSH: Locm 300-399Mg /Ml Iodine,1Ml - 06/07/2017      PSH Notes: Radiation Therapy   NON-GU PSH: Inguinal Hernia Repair > 5 yrs    GU PMH: Gross hematuria (Acute), Culture urine. Empirically Doxycycline 100 mg 1 po BID X 7 days. Send urine for cytology. WIth hx CaP and EXRBT will proceed with hematuria workup with CT and cysto. Since he is voiding w/o clots at this time will avoid foley. Both pt and wife instructed if he does develop clot retention over holiday weekend will need to go to [facility] ER for foley placement and bladder irrigation - 06/02/2017 History of prostate cancer (Chronic) - 06/02/2017, (Stable), His prostate remains smooth and benign with a PSA that remains undetectable. I will continue to monitor this on a yearly basis., - 02/22/2017, - 02/03/2016, Personal history of prostate cancer, - 01/20/2015 BPH w/o LUTS (Stable), He has minimal BPH with minimal voiding symptoms. - 02/22/2017 Post-void dribbling, His only voiding symptoms that a postvoid dribbling and we discussed the benign nature of this and how to try to manage it. - 02/22/2017 BPH w/LUTS, Benign prostatic  hyperplasia with urinary obstruction - 01/20/2015 Urge incontinence, Urge incontinence of urine - 2016 Overactive bladder, Overactive bladder - 2015 Urinary Retention, Unspec, Acute Urinary Retention - 2014      PMH Notes: BPH with bladder outlet obstruction: He is voiding well with slight urgency. He was on Flomax and Proscar but stopped the Proscar and 12/12.   OAB: He said when he gets a strong urge and seated he will stand and will leak urine. He has tried [place] and a 4 mg dose as well as Myrbetriq and a 25 mg dose without significant improvement. He said he really doesn't have any problems at night. He told me that this is worsened by the Lasix that he takes intermittently     NON-GU PMH: Encounter for general adult medical examination without abnormal findings, Encounter for preventive health examination - 2014 Personal history of diseases of the blood and blood-forming organs and certain disorders involving the immune mechanism, History of anemia - 2014 Personal history of other diseases of the circulatory system, History of hypertension - 2014 Personal history of other diseases of the digestive system, History of esophageal reflux - 2014 Personal history of other diseases of the nervous system and sense organs, History of sleep apnea - 2014    FAMILY HISTORY: CAD (coronary artery disease) - Runs in Family Colon Cancer - Mother Hypertension - Runs In Family   SOCIAL HISTORY: Marital Status: Married Preferred Language: English; Ethnicity: Not  Hispanic Or Latino; Race: White Current Smoking Status: Patient does not smoke anymore.   Tobacco Use Assessment Completed: Used Tobacco in last 30 days? Does not use smokeless tobacco. Has never drank.  Does not use drugs. Drinks 2 caffeinated drinks per day. Has not had a blood transfusion.    REVIEW OF SYSTEMS:    GU Review Male:   Patient denies frequent urination, hard to postpone urination, burning/ pain with urination, get up at  night to urinate, leakage of urine, stream starts and stops, trouble starting your stream, have to strain to urinate , erection problems, and penile pain.  Gastrointestinal (Upper):   Patient denies nausea, vomiting, and indigestion/ heartburn.  Gastrointestinal (Lower):   Patient denies diarrhea and constipation.  Constitutional:   Patient denies fever, night sweats, weight loss, and fatigue.  Skin:   Patient denies skin rash/ lesion and itching.  Eyes:   Patient denies blurred vision and double vision.  Ears/ Nose/ Throat:   Patient denies sore throat and sinus problems.  Hematologic/Lymphatic:   Patient denies swollen glands and easy bruising.  Cardiovascular:   Patient denies leg swelling and chest pains.  Respiratory:   Patient denies cough and shortness of breath.  Endocrine:   Patient denies excessive thirst.  Musculoskeletal:   Patient denies back pain and joint pain.  Neurological:   Patient denies headaches and dizziness.  Psychologic:   Patient denies depression and anxiety.   VITAL SIGNS:    Weight 174 lb / 78.93 kg  Height 70 in / 177.8 cm  Pulse 84 /min  BMI 25.0 kg/m  Physical Exam  Constitutional: Well nourished and well developed. No acute distress.   ENT: The ears and nose are normal in appearance.   Neck: The appearance of the neck is normal and no neck mass is present.   Pulmonary: No respiratory distress and normal respiratory rhythm and effort.   Cardiovascular: Heart rate and rhythm are normal. No peripheral edema.   Abdomen:The abdomen is soft and nontender.No masses are palpated.No CVA tenderness. No hernias are palpable.No hepatosplenomegaly noted.  Genitourinary:Examination of the penis demonstrates no discharge, no masses, no lesions and a normal meatus. The scrotum is without lesions. The right epididymis is palpably normal and non-tender. The left epididymis is palpably normal and non-tender. The right testis is non-tender and without masses. The left  testis is non-tender and without masses. Rectal exam demonstrates normal sphincter tone, no tenderness and no masses. The prostate is smooth and flat. The prostate has no nodularity and is not tender. The left seminal vesicle is nonpalpable. The right seminal vesicle is nonpalpable. The perineum is normal on inspection.   Lymphatics:The femoral and inguinal nodes are not enlarged or tender.  Skin: Normal skin turgor, no visible rash and no visible skin lesions.   Neuro/Psych: Mood and affect are appropriate.     PAST DATA REVIEWED:  Source Of History:  Patient  Lab Test Review:   BUN/Creatinine  Records Review:   Previous Patient Records, POC Tool  X-Ray Review: C.T. Hematuria: Reviewed Films. Reviewed Report. Discussed With Patient. EXAM: CT ABDOMEN AND PELVIS WITHOUT AND WITH CONTRAST TECHNIQUE: Multidetector CT imaging of the abdomen and pelvis was performed following the standard protocol before and following the bolus administration of intravenous contrast. CONTRAST: 125 cc Isovue-300. COMPARISON: 02/11/2017. FINDINGS: Lower chest: 4 mm subpleural lingular nodule, likely a subpleural lymph node, stable. Heart is at the upper limits of normal in size. No pericardial or pleural effusion. Moderate hiatal hernia.  Lymph node adjacent to the proximal stomach, at the diaphragmatic hiatus, measures 9 mm, similar. Hepatobiliary: Liver is unremarkable. Cholecystectomy. No biliary ductal dilatation. Pancreas: Negative. Spleen: Negative. Adrenals/Urinary Tract: Adrenal glands are unremarkable. Punctate stone in the right kidney. Nonenhancing fluid density 2.4 cm cyst in the interpolar left kidney. Renal cortical thinning bilaterally, left greater than right. No filling defects in the intrarenal collecting systems or ureters. A 1.1 x 1.3 cm soft tissue density structure is seen in the dependent portion of the bladder and appears to move slightly in position in the interval between pre contrast and portal venous  phase imaging. It measures 44 Hounsfield units on precontrast imaging and 47 Hounsfield units on postcontrast imaging. On delayed excretory phase, lesion measures 127 Hounsfield units. Additionally, lesion appears new from 02/11/2017. Bladder wall trabeculation. Prostate indents the bladder. Stomach/Bowel: Moderate hiatal hernia. Stomach, small bowel and colon are unremarkable. Appendix is not readily identified. Vascular/Lymphatic: Atherosclerotic calcification of the arterial vasculature without abdominal aortic aneurysm. Abdominal aorta is somewhat tortuous. Borderline enlarged inguinal lymph nodes, measuring up to 10 mm on the left. Otherwise, no pathologically enlarged lymph nodes in the abdomen or pelvis. Reproductive: Prostate indents the bladder base slightly. Brachytherapy seeds in place. Other: No free fluid. Mesenteries and peritoneum are unremarkable. Left-sided ventral pelvic wall hernia repair. Musculoskeletal: Degenerative changes and scoliosis in the spine. No worrisome lytic or sclerotic lesions. IMPRESSION: 1. Dependent bladder lesion appears mobile and shows an increase in attenuation on delayed excretory phase imaging only (no enhancement on portal venous phase imaging). Additionally, lesion is likely new from 02/03/2017. Collectively, findings are suggestive of a blood clot. Urothelial carcinoma is not excluded. 2. Punctate right renal stone. 3. Moderate hiatal hernia. 4. Aortic atherosclerosis (ICD10-170.0). 5. Bladder wall trabeculation is indicative of an element of outlet obstruction. 6. Small to borderline enlarged inguinal lymph nodes, similar.     02/17/17 01/20/16 01/14/15 01/22/14 01/15/13 01/28/12 01/25/11 12/29/09  PSA  Total PSA <0.015 ng/mL < 0.015  <0.01  <0.01  <0.01  <0.01  <0.01  0.00     02/17/17 01/22/14 01/15/13 01/28/12 01/25/11  Hormones  Testosterone, Total 24.5 ng/dL 32  30  48.05  52.13     06/02/17  General Chemistry  Creatinine 1.2 mg/dL  Notes:                      His creatinine was found to be normal.   PROCEDURES:         Flexible Cystoscopy - 52000  Risks, benefits, and some of the potential complications of the procedure were discussed at length with the patient including infection, bleeding, voiding discomfort, urinary retention, fever, chills, sepsis, and others. All questions were answered. Informed consent was obtained. Sterile technique and 2% Lidocaine intraurethral analgesia were used.  Meatus:  Normal size. Normal location. Normal condition.  Urethra:  No strictures.  External Sphincter:  Normal.  Verumontanum:  Normal.  Prostate:  Obstructing. Moderate hyperplasia.  Bladder Neck:  Non-obstructing.  Ureteral Orifices:  Normal location. Normal size. Normal shape. Effluxed clear urine.  Bladder:  Severe trabeculation. Cellules. I noted an area just behind the right UO that appeared abnormal. It appeared that this is where the bleeding was coming from.      The lower urinary tract was carefully examined. The procedure was well-tolerated and without complications. Instructions were given to call the office immediately for bloody urine, difficulty urinating, urinary retention, painful or frequent urination, fever or other illness. The patient  stated that he understood these instructions and would comply with them.         Urinalysis Dipstick Dipstick Cont'd Micro  Color: Red Bilirubin: Invalid WBC/hpf: NS (Not Seen)  Appearance: Turbid Ketones: Invalid RBC/hpf: >60/hpf  Specific Gravity: Invalid Blood: Invalid Bacteria: Few (10-25/hpf)  pH: Invalid Protein: Invalid Cystals: NS (Not Seen)  Glucose: Invalid Urobilinogen: Invalid Casts: NS (Not Seen)    Nitrites: Invalid Trichomonas: Not Present    Leukocyte Esterase: Invalid Mucous: Not Present      Epithelial Cells: NS (Not Seen)      Yeast: NS (Not Seen)      Sperm: Not Present    Notes: MICROSCOPIC PERFORMED ON UNCONCENTRATED URINE    ASSESSMENT/PLAN:      ICD-10 Details   1 GU:   Gross hematuria - R31.0 Stable - It appears his gross hematuria is secondary to a lesion I found within the bladder. He is going to remain off aspirin and we discussed proceeding with transurethral resection of his bladder lesion.  2   Renal calculus - N20.0 Right, His CT scan revealed a punctate right renal stone that was peripherally located and is of no clinical significance.  3 NON-GU:   Bacteriuria - R82.71 I noted bacteria on his UA but no white cells. I suspect this is contamination as he is uncircumcised but I will culture since he is being scheduled for surgery.          Notes:   I went over the procedure of transurethral resection of his bladder/lesion with him today in detail. We discussed the risks and complications, the probability of success, the outpatient nature of the procedure as well as the anticipated postoperative course. He understands and has elected to proceed.

## 2017-07-15 ENCOUNTER — Encounter (HOSPITAL_BASED_OUTPATIENT_CLINIC_OR_DEPARTMENT_OTHER)
Admission: RE | Disposition: A | Discharge status: Home / Self Care | Payer: Self-pay | Source: Ambulatory Visit | Attending: Urology

## 2017-07-15 ENCOUNTER — Encounter (HOSPITAL_BASED_OUTPATIENT_CLINIC_OR_DEPARTMENT_OTHER): Payer: Self-pay

## 2017-07-15 ENCOUNTER — Ambulatory Visit (HOSPITAL_BASED_OUTPATIENT_CLINIC_OR_DEPARTMENT_OTHER): Payer: Medicare Other | Admitting: Anesthesiology

## 2017-07-15 ENCOUNTER — Ambulatory Visit (HOSPITAL_BASED_OUTPATIENT_CLINIC_OR_DEPARTMENT_OTHER)
Admission: RE | Admit: 2017-07-15 | Discharge: 2017-07-15 | Disposition: A | Discharge status: Home / Self Care | Payer: Medicare Other | Source: Ambulatory Visit | Attending: Urology | Admitting: Urology

## 2017-07-15 DIAGNOSIS — K219 Gastro-esophageal reflux disease without esophagitis: Secondary | ICD-10-CM | POA: Insufficient documentation

## 2017-07-15 DIAGNOSIS — Z87891 Personal history of nicotine dependence: Secondary | ICD-10-CM | POA: Insufficient documentation

## 2017-07-15 DIAGNOSIS — R310 Gross hematuria: Secondary | ICD-10-CM | POA: Diagnosis not present

## 2017-07-15 DIAGNOSIS — D494 Neoplasm of unspecified behavior of bladder: Secondary | ICD-10-CM

## 2017-07-15 DIAGNOSIS — C679 Malignant neoplasm of bladder, unspecified: Secondary | ICD-10-CM | POA: Insufficient documentation

## 2017-07-15 DIAGNOSIS — I10 Essential (primary) hypertension: Secondary | ICD-10-CM | POA: Diagnosis not present

## 2017-07-15 DIAGNOSIS — Z79899 Other long term (current) drug therapy: Secondary | ICD-10-CM | POA: Diagnosis not present

## 2017-07-15 DIAGNOSIS — D649 Anemia, unspecified: Secondary | ICD-10-CM | POA: Diagnosis not present

## 2017-07-15 DIAGNOSIS — D644 Congenital dyserythropoietic anemia: Secondary | ICD-10-CM | POA: Diagnosis not present

## 2017-07-15 DIAGNOSIS — I739 Peripheral vascular disease, unspecified: Secondary | ICD-10-CM | POA: Diagnosis not present

## 2017-07-15 DIAGNOSIS — E785 Hyperlipidemia, unspecified: Secondary | ICD-10-CM | POA: Diagnosis not present

## 2017-07-15 HISTORY — DX: Personal history of malignant neoplasm of prostate: Z85.46

## 2017-07-15 HISTORY — DX: Localized edema: R60.0

## 2017-07-15 HISTORY — DX: Diaphragmatic hernia without obstruction or gangrene: K44.9

## 2017-07-15 HISTORY — DX: Presence of external hearing-aid: Z97.4

## 2017-07-15 HISTORY — DX: Chronic kidney disease, stage 3 (moderate): N18.3

## 2017-07-15 HISTORY — DX: Paroxysmal atrial fibrillation: I48.0

## 2017-07-15 HISTORY — PX: TRANSURETHRAL RESECTION OF BLADDER TUMOR: SHX2575

## 2017-07-15 HISTORY — DX: Other intervertebral disc degeneration, lumbosacral region: M51.37

## 2017-07-15 HISTORY — DX: Unspecified macular degeneration: H35.30

## 2017-07-15 HISTORY — DX: Low back pain: M54.5

## 2017-07-15 HISTORY — DX: Gross hematuria: R31.0

## 2017-07-15 HISTORY — DX: Other chronic pain: G89.29

## 2017-07-15 HISTORY — DX: Other intervertebral disc degeneration, lumbosacral region without mention of lumbar back pain or lower extremity pain: M51.379

## 2017-07-15 HISTORY — DX: Hereditary and idiopathic neuropathy, unspecified: G60.9

## 2017-07-15 HISTORY — DX: Other forms of scoliosis, site unspecified: M41.80

## 2017-07-15 HISTORY — DX: Iron deficiency anemia, unspecified: D50.9

## 2017-07-15 HISTORY — DX: Personal history of irradiation: Z92.3

## 2017-07-15 HISTORY — DX: Presence of spectacles and contact lenses: Z97.3

## 2017-07-15 HISTORY — DX: Complete loss of teeth, unspecified cause, unspecified class: K08.109

## 2017-07-15 HISTORY — DX: Sick sinus syndrome: I49.5

## 2017-07-15 HISTORY — DX: Emphysema, unspecified: J43.9

## 2017-07-15 HISTORY — DX: Personal history of other venous thrombosis and embolism: Z86.718

## 2017-07-15 HISTORY — DX: Other secondary scoliosis, site unspecified: M41.50

## 2017-07-15 HISTORY — DX: Personal history of other diseases of the digestive system: Z87.19

## 2017-07-15 HISTORY — DX: Low back pain, unspecified: M54.50

## 2017-07-15 HISTORY — DX: Nocturia: R35.1

## 2017-07-15 HISTORY — DX: Bladder disorder, unspecified: N32.9

## 2017-07-15 HISTORY — DX: Presence of dental prosthetic device (complete) (partial): Z97.2

## 2017-07-15 HISTORY — DX: Chronic kidney disease, stage 3 unspecified: N18.30

## 2017-07-15 HISTORY — DX: Personal history of other diseases of the respiratory system: Z87.09

## 2017-07-15 HISTORY — DX: Presence of cardiac pacemaker: Z95.0

## 2017-07-15 LAB — POCT I-STAT, CHEM 8
BUN: 28 mg/dL — ABNORMAL HIGH (ref 6–20)
Calcium, Ion: 1.2 mmol/L (ref 1.15–1.40)
Chloride: 106 mmol/L (ref 101–111)
Creatinine, Ser: 1 mg/dL (ref 0.61–1.24)
Glucose, Bld: 92 mg/dL (ref 65–99)
HCT: 29 % — ABNORMAL LOW (ref 39.0–52.0)
Hemoglobin: 9.9 g/dL — ABNORMAL LOW (ref 13.0–17.0)
Potassium: 4 mmol/L (ref 3.5–5.1)
Sodium: 141 mmol/L (ref 135–145)
TCO2: 22 mmol/L (ref 22–32)

## 2017-07-15 SURGERY — TURBT (TRANSURETHRAL RESECTION OF BLADDER TUMOR)
Anesthesia: General | Site: Bladder

## 2017-07-15 MED ORDER — LIDOCAINE 2% (20 MG/ML) 5 ML SYRINGE
INTRAMUSCULAR | Status: AC
  Filled 2017-07-15: qty 5

## 2017-07-15 MED ORDER — SODIUM CHLORIDE 0.9 % IR SOLN
Status: DC | PRN
  Administered 2017-07-15: 3000 mL

## 2017-07-15 MED ORDER — PROPOFOL 10 MG/ML IV BOLUS
INTRAVENOUS | Status: DC | PRN
Start: 2017-07-15 — End: 2017-07-15
  Administered 2017-07-15: 30 mg via INTRAVENOUS
  Administered 2017-07-15: 120 mg via INTRAVENOUS
  Administered 2017-07-15: 30 mg via INTRAVENOUS

## 2017-07-15 MED ORDER — LIDOCAINE 2% (20 MG/ML) 5 ML SYRINGE
INTRAMUSCULAR | Status: DC | PRN
  Administered 2017-07-15: 50 mg via INTRAVENOUS

## 2017-07-15 MED ORDER — ONDANSETRON HCL 4 MG/2ML IJ SOLN
INTRAMUSCULAR | Status: AC
  Filled 2017-07-15: qty 2

## 2017-07-15 MED ORDER — FENTANYL CITRATE (PF) 100 MCG/2ML IJ SOLN
INTRAMUSCULAR | Status: AC
  Filled 2017-07-15: qty 2

## 2017-07-15 MED ORDER — PHENAZOPYRIDINE HCL 100 MG PO TABS
ORAL_TABLET | ORAL | Status: AC
Start: 2017-07-15 — End: ?
  Filled 2017-07-15: qty 1

## 2017-07-15 MED ORDER — PHENAZOPYRIDINE HCL 100 MG PO TABS: 100.0000 mg | ORAL_TABLET | Freq: Three times a day (TID) | ORAL | 0 refills | Status: DC | PRN

## 2017-07-15 MED ORDER — ONDANSETRON HCL 4 MG/2ML IJ SOLN
INTRAMUSCULAR | Status: DC | PRN
  Administered 2017-07-15: 4 mg via INTRAVENOUS

## 2017-07-15 MED ORDER — CIPROFLOXACIN IN D5W 200 MG/100ML IV SOLN
200.0000 mg | INTRAVENOUS | Status: AC
  Administered 2017-07-15: 200 mg via INTRAVENOUS
  Filled 2017-07-15: qty 100

## 2017-07-15 MED ORDER — FENTANYL CITRATE (PF) 100 MCG/2ML IJ SOLN
25.0000 ug | INTRAMUSCULAR | Status: DC | PRN
  Filled 2017-07-15: qty 1

## 2017-07-15 MED ORDER — PHENAZOPYRIDINE HCL 100 MG PO TABS
100.0000 mg | ORAL_TABLET | Freq: Three times a day (TID) | ORAL | Status: DC
  Administered 2017-07-15: 100 mg via ORAL
  Filled 2017-07-15: qty 1

## 2017-07-15 MED ORDER — PROPOFOL 10 MG/ML IV BOLUS
INTRAVENOUS | Status: AC
  Filled 2017-07-15: qty 20

## 2017-07-15 MED ORDER — SODIUM CHLORIDE 0.9 % IV SOLN
INTRAVENOUS | Status: DC
  Administered 2017-07-15: 1000 mL via INTRAVENOUS
  Filled 2017-07-15: qty 1000

## 2017-07-15 MED ORDER — CIPROFLOXACIN IN D5W 200 MG/100ML IV SOLN
INTRAVENOUS | Status: AC
  Filled 2017-07-15: qty 100

## 2017-07-15 MED ORDER — FENTANYL CITRATE (PF) 100 MCG/2ML IJ SOLN
INTRAMUSCULAR | Status: DC | PRN
  Administered 2017-07-15: 25 ug via INTRAVENOUS

## 2017-07-15 SURGICAL SUPPLY — 34 items
BAG DRAIN URO-CYSTO SKYTR STRL (DRAIN) ×2 IMPLANT
BAG DRN ANRFLXCHMBR STRAP LEK (BAG)
BAG DRN UROCATH (DRAIN) ×1
BAG URINE DRAINAGE (UROLOGICAL SUPPLIES) IMPLANT
BAG URINE LEG 19OZ MD ST LTX (BAG) IMPLANT
CATH FOLEY 2WAY SLVR  5CC 20FR (CATHETERS)
CATH FOLEY 2WAY SLVR  5CC 22FR (CATHETERS)
CATH FOLEY 2WAY SLVR  5CC 24FR (CATHETERS) ×1
CATH FOLEY 2WAY SLVR 5CC 20FR (CATHETERS) IMPLANT
CATH FOLEY 2WAY SLVR 5CC 22FR (CATHETERS) IMPLANT
CATH FOLEY 2WAY SLVR 5CC 24FR (CATHETERS) ×1 IMPLANT
CATH FOLEY 3WAY 20FR (CATHETERS) IMPLANT
CLOTH BEACON ORANGE TIMEOUT ST (SAFETY) ×2 IMPLANT
ELECT BIVAP BIPO 22/24 DONUT (ELECTROSURGICAL) ×2
ELECT REM PT RETURN 9FT ADLT (ELECTROSURGICAL) ×2
ELECTRD BIVAP BIPO 22/24 DONUT (ELECTROSURGICAL) ×1 IMPLANT
ELECTRODE REM PT RTRN 9FT ADLT (ELECTROSURGICAL) ×1 IMPLANT
EVACUATOR MICROVAS BLADDER (UROLOGICAL SUPPLIES) IMPLANT
GLOVE BIO SURGEON STRL SZ8 (GLOVE) ×2 IMPLANT
GLOVE SURG SS PI 6.5 STRL IVOR (GLOVE) ×2 IMPLANT
GOWN STRL REUS W/ TWL LRG LVL3 (GOWN DISPOSABLE) ×1 IMPLANT
GOWN STRL REUS W/ TWL XL LVL3 (GOWN DISPOSABLE) ×1 IMPLANT
GOWN STRL REUS W/TWL LRG LVL3 (GOWN DISPOSABLE) ×2
GOWN STRL REUS W/TWL XL LVL3 (GOWN DISPOSABLE) ×2
HOLDER FOLEY CATH W/STRAP (MISCELLANEOUS) IMPLANT
IV NS IRRIG 3000ML ARTHROMATIC (IV SOLUTION) ×1 IMPLANT
KIT TURNOVER CYSTO (KITS) ×2 IMPLANT
LOOP CUT BIPOLAR 24F LRG (ELECTROSURGICAL) ×1 IMPLANT
MANIFOLD NEPTUNE II (INSTRUMENTS) ×2 IMPLANT
PACK CYSTO (CUSTOM PROCEDURE TRAY) ×2 IMPLANT
PLUG CATH AND CAP STER (CATHETERS) IMPLANT
TUBE CONNECTING 12X1/4 (SUCTIONS) ×2 IMPLANT
TUBING UROLOGY SET (TUBING) ×1 IMPLANT
WATER STERILE IRR 3000ML UROMA (IV SOLUTION) IMPLANT

## 2017-07-15 NOTE — Op Note (Signed)
PATIENT:  [name] [name]  PRE-OPERATIVE DIAGNOSIS: Bladder tumor  POST-OPERATIVE DIAGNOSIS: Same  PROCEDURE:  Procedure(s): TRANSURETHRAL RESECTION OF BLADDER TUMOR (TURBT) (1.0 cm.)   SURGEON:  Surgeon(s): Claybon Jabs  ANESTHESIA:   General  EBL:  Minimal  DRAINS: None  SPECIMEN:  Bladder tumor  DISPOSITION OF SPECIMEN:  PATHOLOGY  Indication: [name] [name] is a 84 year old male who recently experienced gross hematuria.  He did not experience any voiding symptoms.  He had been taking aspirin but stopped the medication.  Upper tract evaluation revealed a punctate right renal stone but no ureteral calculi or other findings of the upper tract.  Cystoscopic evaluation revealed a lesion posterior to the right ureteral orifice that was suspicious for possible transitional cell carcinoma.  He has not seen any further hematuria.  He presents today for transurethral resection of the lesion.  Description of operation: The patient was taken to the operating room and administered general anesthesia. They were then placed on the table and moved to the dorsal lithotomy position after which the genitalia was sterilely prepped and draped. An official timeout was then performed.  The 56 French resectoscope with the 30 lens and visual obturator were then passed into the bladder under direct visualization. Urethra appeared normal. The visual obturator was then removed and the Gyrus resectoscope element with 30  lens was then inserted and the bladder was fully and systematically inspected. Ureteral orifices were noted to be in the normal anatomic positions.  There was 3+ trabeculation with early cellule formation.  The only lesion identified within the bladder was a partially papillary appearing lesion on the floor of the bladder just posterior to the right ureteral orifice.  It did not involve the UO.  I inserted the resectoscope element and resected the lesion.  I was able to achieve this without  resecting the ureteral orifice.  The base of the lesion was fulgurated and I then removed the portions of tumor that were resected and these were sent to pathology.  Reinspection revealed the orifice was intact, all lesion had been resected and there was no active bleeding.  I therefore drained the bladder, removed the resectoscope and the patient was awakened and taken to the recovery room in stable and satisfactory condition.  He tolerated the procedure well with no intraoperative complications.  PLAN OF CARE: Discharge to home after PACU  PATIENT DISPOSITION:  PACU - hemodynamically stable.

## 2017-07-15 NOTE — Anesthesia Preprocedure Evaluation (Addendum)
Anesthesia Evaluation  Patient identified by MRN, date of birth, ID band Patient awake    Reviewed: Allergy & Precautions, NPO status , Patient's Chart, lab work & pertinent test results  Airway Mallampati: II  TM Distance: >3 FB     Dental   Pulmonary sleep apnea , COPD, former smoker,    breath sounds clear to auscultation       Cardiovascular hypertension, + Peripheral Vascular Disease and + DOE  + pacemaker  Rhythm:Regular Rate:Normal     Neuro/Psych    GI/Hepatic Neg liver ROS, hiatal hernia, GERD  ,  Endo/Other  negative endocrine ROS  Renal/GU Renal disease     Musculoskeletal  (+) Arthritis ,   Abdominal   Peds  Hematology  (+) anemia ,   Anesthesia Other Findings   Reproductive/Obstetrics                             Anesthesia Physical Anesthesia Plan  ASA: III  Anesthesia Plan: General   Post-op Pain Management:    Induction: Intravenous  PONV Risk Score and Plan: Treatment may vary due to age or medical condition, Ondansetron, Dexamethasone and Midazolam  Airway Management Planned: LMA  Additional Equipment:   Intra-op Plan:   Post-operative Plan: Extubation in OR  Informed Consent: I have reviewed the patients History and Physical, chart, labs and discussed the procedure including the risks, benefits and alternatives for the proposed anesthesia with the patient or authorized representative who has indicated his/her understanding and acceptance.   Dental advisory given  Plan Discussed with: CRNA and Anesthesiologist  Anesthesia Plan Comments:         Anesthesia Quick Evaluation

## 2017-07-15 NOTE — Anesthesia Postprocedure Evaluation (Signed)
Anesthesia Post Note  Patient: [name] [name]  Procedure(s) Performed: CYSTOSCOPY TRANSURETHRAL RESECTION OF BLADDER TUMOR (TURBT) (N/A Bladder)     Patient location during evaluation: PACU Anesthesia Type: General Level of consciousness: awake Vital Signs Assessment: post-procedure vital signs reviewed and stable Respiratory status: spontaneous breathing Cardiovascular status: stable Anesthetic complications: no    Last Vitals:  Vitals:   07/15/17 1000 07/15/17 1015  BP: (!) 169/74 (!) 172/81  Pulse: 62 62  Resp: 12 14  Temp:    SpO2: 95% 99%    Last Pain:  Vitals:   07/15/17 1015  TempSrc:   PainSc: 4                  Medford Staheli

## 2017-07-15 NOTE — Transfer of Care (Signed)
Immediate Anesthesia Transfer of Care Note  Patient: [name] [name]  Procedure(s) Performed: CYSTOSCOPY TRANSURETHRAL RESECTION OF BLADDER TUMOR (TURBT) (N/A Bladder)  Patient Location: PACU  Anesthesia Type:General  Level of Consciousness: sedated and responds to stimulation  Airway & Oxygen Therapy: Patient Spontanous Breathing and Patient connected to nasal cannula oxygen  Post-op Assessment: Report given to RN  Post vital signs: Reviewed and stable  Last Vitals:157/68  Vitals Value Taken Time  BP    Temp    Pulse 61 07/15/2017  8:46 AM  Resp 13 07/15/2017  8:46 AM  SpO2 100 % 07/15/2017  8:46 AM  Vitals shown include unvalidated device data.  Last Pain:  Vitals:   07/15/17 0708  TempSrc:   PainSc: 0-No pain      Patients Stated Pain Goal: 5 (55/[id number]/41 [id number])  Complications: No apparent anesthesia complications

## 2017-07-15 NOTE — Anesthesia Postprocedure Evaluation (Signed)
Anesthesia Post Note  Patient: [name] [name]  Procedure(s) Performed: CYSTOSCOPY TRANSURETHRAL RESECTION OF BLADDER TUMOR (TURBT) (N/A Bladder)     Patient location during evaluation: PACU Anesthesia Type: General Level of consciousness: awake Pain management: pain level controlled Vital Signs Assessment: post-procedure vital signs reviewed and stable Respiratory status: spontaneous breathing Cardiovascular status: stable Anesthetic complications: no    Last Vitals:  Vitals:   07/15/17 1000 07/15/17 1015  BP: (!) 169/74 (!) 172/81  Pulse: 62 62  Resp: 12 14  Temp:    SpO2: 95% 99%    Last Pain:  Vitals:   07/15/17 1015  TempSrc:   PainSc: 4                  Narjis Mira

## 2017-07-15 NOTE — Discharge Instructions (Signed)
Transurethral Resection of Bladder Tumor (TURBT)   Definition:  Transurethral Resection of the Bladder Tumor is a surgical procedure used to diagnose and remove tumors within the bladder. TURBT is the most common treatment for early stage bladder cancer.  General instructions:     Your recent bladder surgery requires very little post hospital care but some definite precautions.  Despite the fact that no skin incisions were used, the area around the bladder incisions are raw and covered with scabs to promote healing and prevent bleeding. Certain precautions are needed to insure that the scabs are not disturbed over the next 2-4 weeks while the healing proceeds.  Because the raw surface inside your bladder and the irritating effects of urine you may expect frequency of urination and/or urgency (a stronger desire to urinate) and perhaps even getting up at night more often. This will usually resolve or improve slowly over the healing period. You may see some blood in your urine over the first 6 weeks. Do not be alarmed, even if the urine was clear for a while. Get off your feet and drink lots of fluids until clearing occurs. If you start to pass clots or don't improve call us.  Catheter: (If you are discharged with a catheter.)  1. Keep your catheter secured to your leg at all times with tape or the supplied strap. 2. You may experience leakage of urine around your catheter- as long as the  catheter continues to drain, this is normal.  If your catheter stops draining  go to the ER. 3. You may also have blood in your urine, even after it has been clear for  several days; you may even pass some small blood clots or other material.  This  is normal as well.  If this happens, sit down and drink plenty of water to help  make urine to flush out your bladder.  If the blood in your urine becomes worse  after doing this, contact our office or return to the ER. 4. You may use the leg bag (small bag)  during the day, but use the large bag at  night.  Diet:  You may return to your normal diet immediately. Because of the raw surface of your bladder, alcohol, spicy foods, foods high in acid and drinks with caffeine may cause irritation or frequency and should be used in moderation. To keep your urine flowing freely and avoid constipation, drink plenty of fluids during the day (8-10 glasses). Tip: Avoid cranberry juice because it is very acidic.  Activity:  Your physical activity doesn't need to be restricted. However, if you are very active, you may see some blood in the urine. We suggest that you reduce your activity under the circumstances until the bleeding has stopped.  Bowels:  It is important to keep your bowels regular during the postoperative period. Straining with bowel movements can cause bleeding. A bowel movement every other day is reasonable. Use a mild laxative if needed, such as milk of magnesia 2-3 tablespoons, or 2 Dulcolax tablets. Call if you continue to have problems. If you had been taking narcotics for pain, before, during or after your surgery, you may be constipated. Take a laxative if necessary.    Medication:  You should resume your pre-surgery medications unless told not to. In addition you may be given an antibiotic to prevent or treat infection. Antibiotics are not always necessary. All medication should be taken as prescribed until the bottles are finished unless you are having  an unusual reaction to one of the drugs.   Post Anesthesia Home Care Instructions  Activity: Get plenty of rest for the remainder of the day. A responsible individual must stay with you for 24 hours following the procedure.  For the next 24 hours, DO NOT: -Drive a car -Operate machinery -Drink alcoholic beverages -Take any medication unless instructed by your physician -Make any legal decisions or sign important papers.  Meals: Start with liquid foods such as gelatin or soup.  Progress to regular foods as tolerated. Avoid greasy, spicy, heavy foods. If nausea and/or vomiting occur, drink only clear liquids until the nausea and/or vomiting subsides. Call your physician if vomiting continues.  Special Instructions/Symptoms: Your throat may feel dry or sore from the anesthesia or the breathing tube placed in your throat during surgery. If this causes discomfort, gargle with warm salt water. The discomfort should disappear within 24 hours.  If you had a scopolamine patch placed behind your ear for the management of post- operative nausea and/or vomiting:  1. The medication in the patch is effective for 72 hours, after which it should be removed.  Wrap patch in a tissue and discard in the trash. Wash hands thoroughly with soap and water. 2. You may remove the patch earlier than 72 hours if you experience unpleasant side effects which may include dry mouth, dizziness or visual disturbances. 3. Avoid touching the patch. Wash your hands with soap and water after contact with the patch.        

## 2017-07-15 NOTE — Anesthesia Procedure Notes (Signed)
Procedure Name: LMA Insertion Date/Time: 07/15/2017 8:17 AM Performed by: Bonney Aid, CRNA Pre-anesthesia Checklist: Patient identified, Emergency Drugs available, Suction available and Patient being monitored Patient Re-evaluated:Patient Re-evaluated prior to induction Oxygen Delivery Method: Circle system utilized Preoxygenation: Pre-oxygenation with 100% oxygen Induction Type: IV induction Ventilation: Mask ventilation without difficulty LMA: LMA inserted LMA Size: 5.0 Number of attempts: 1 Airway Equipment and Method: Bite block Placement Confirmation: positive ETCO2 Tube secured with: Tape Dental Injury: Teeth and Oropharynx as per pre-operative assessment

## 2017-07-18 ENCOUNTER — Encounter (HOSPITAL_BASED_OUTPATIENT_CLINIC_OR_DEPARTMENT_OTHER): Payer: Self-pay | Admitting: Urology

## 2017-07-22 DIAGNOSIS — N3281 Overactive bladder: Secondary | ICD-10-CM | POA: Diagnosis not present

## 2017-07-22 DIAGNOSIS — Z8551 Personal history of malignant neoplasm of bladder: Secondary | ICD-10-CM | POA: Diagnosis not present

## 2017-07-25 DIAGNOSIS — Z79891 Long term (current) use of opiate analgesic: Secondary | ICD-10-CM | POA: Diagnosis not present

## 2017-07-25 DIAGNOSIS — M546 Pain in thoracic spine: Secondary | ICD-10-CM | POA: Diagnosis not present

## 2017-07-25 DIAGNOSIS — G894 Chronic pain syndrome: Secondary | ICD-10-CM | POA: Diagnosis not present

## 2017-07-25 DIAGNOSIS — M48062 Spinal stenosis, lumbar region with neurogenic claudication: Secondary | ICD-10-CM | POA: Diagnosis not present

## 2017-07-26 DIAGNOSIS — L309 Dermatitis, unspecified: Secondary | ICD-10-CM | POA: Diagnosis not present

## 2017-07-28 DIAGNOSIS — M810 Age-related osteoporosis without current pathological fracture: Secondary | ICD-10-CM | POA: Diagnosis not present

## 2017-07-28 DIAGNOSIS — G629 Polyneuropathy, unspecified: Secondary | ICD-10-CM | POA: Diagnosis not present

## 2017-07-28 DIAGNOSIS — H3530 Unspecified macular degeneration: Secondary | ICD-10-CM | POA: Diagnosis not present

## 2017-07-28 DIAGNOSIS — Z0000 Encounter for general adult medical examination without abnormal findings: Secondary | ICD-10-CM | POA: Diagnosis not present

## 2017-07-28 DIAGNOSIS — M150 Primary generalized (osteo)arthritis: Secondary | ICD-10-CM | POA: Diagnosis not present

## 2017-07-28 DIAGNOSIS — I714 Abdominal aortic aneurysm, without rupture: Secondary | ICD-10-CM | POA: Diagnosis not present

## 2017-07-28 DIAGNOSIS — R2689 Other abnormalities of gait and mobility: Secondary | ICD-10-CM | POA: Diagnosis not present

## 2017-07-28 DIAGNOSIS — D649 Anemia, unspecified: Secondary | ICD-10-CM | POA: Diagnosis not present

## 2017-07-28 DIAGNOSIS — Z5181 Encounter for therapeutic drug level monitoring: Secondary | ICD-10-CM | POA: Diagnosis not present

## 2017-07-28 DIAGNOSIS — I10 Essential (primary) hypertension: Secondary | ICD-10-CM | POA: Diagnosis not present

## 2017-07-28 DIAGNOSIS — M48061 Spinal stenosis, lumbar region without neurogenic claudication: Secondary | ICD-10-CM | POA: Diagnosis not present

## 2017-07-28 DIAGNOSIS — Z1389 Encounter for screening for other disorder: Secondary | ICD-10-CM | POA: Diagnosis not present

## 2017-08-01 ENCOUNTER — Other Ambulatory Visit: Payer: Self-pay | Admitting: Gastroenterology

## 2017-08-01 DIAGNOSIS — R1310 Dysphagia, unspecified: Secondary | ICD-10-CM | POA: Diagnosis not present

## 2017-08-01 DIAGNOSIS — D649 Anemia, unspecified: Secondary | ICD-10-CM | POA: Diagnosis not present

## 2017-08-08 DIAGNOSIS — M48061 Spinal stenosis, lumbar region without neurogenic claudication: Secondary | ICD-10-CM | POA: Diagnosis not present

## 2017-08-08 DIAGNOSIS — I10 Essential (primary) hypertension: Secondary | ICD-10-CM | POA: Diagnosis not present

## 2017-08-08 DIAGNOSIS — I872 Venous insufficiency (chronic) (peripheral): Secondary | ICD-10-CM | POA: Diagnosis not present

## 2017-08-08 DIAGNOSIS — R600 Localized edema: Secondary | ICD-10-CM | POA: Diagnosis not present

## 2017-08-08 DIAGNOSIS — S300XXA Contusion of lower back and pelvis, initial encounter: Secondary | ICD-10-CM | POA: Diagnosis not present

## 2017-08-08 DIAGNOSIS — Z5181 Encounter for therapeutic drug level monitoring: Secondary | ICD-10-CM | POA: Diagnosis not present

## 2017-08-08 DIAGNOSIS — I83015 Varicose veins of right lower extremity with ulcer other part of foot: Secondary | ICD-10-CM | POA: Diagnosis not present

## 2017-08-08 DIAGNOSIS — G629 Polyneuropathy, unspecified: Secondary | ICD-10-CM | POA: Diagnosis not present

## 2017-08-08 DIAGNOSIS — K432 Incisional hernia without obstruction or gangrene: Secondary | ICD-10-CM | POA: Diagnosis not present

## 2017-08-08 DIAGNOSIS — M150 Primary generalized (osteo)arthritis: Secondary | ICD-10-CM | POA: Diagnosis not present

## 2017-08-08 DIAGNOSIS — R2689 Other abnormalities of gait and mobility: Secondary | ICD-10-CM | POA: Diagnosis not present

## 2017-08-08 DIAGNOSIS — D649 Anemia, unspecified: Secondary | ICD-10-CM | POA: Diagnosis not present

## 2017-08-11 ENCOUNTER — Ambulatory Visit (INDEPENDENT_AMBULATORY_CARE_PROVIDER_SITE_OTHER): Payer: Medicare Other

## 2017-08-11 ENCOUNTER — Ambulatory Visit (INDEPENDENT_AMBULATORY_CARE_PROVIDER_SITE_OTHER): Payer: Medicare Other | Admitting: Orthopaedic Surgery

## 2017-08-11 DIAGNOSIS — M5442 Lumbago with sciatica, left side: Secondary | ICD-10-CM | POA: Diagnosis not present

## 2017-08-11 DIAGNOSIS — M5441 Lumbago with sciatica, right side: Secondary | ICD-10-CM

## 2017-08-11 NOTE — Progress Notes (Signed)
Office Visit Note   Patient: [name] [name]           Date of Birth: April 09, 1924           MRN: [id number] Visit Date: 08/11/2017              Requested by: [name], MD [place], [place] [id number] PCP: [name], MD   Assessment & Plan: Visit Diagnoses:  1. Midline low back pain with bilateral sciatica, unspecified chronicity     Plan: Low back pain.  We will need to obtain a CT scan to rule out fracture since x-rays are inconclusive.  Lumbar corset for now.  Pain meds as needed.  I will call the patient with the results.  Follow-Up Instructions: Return if symptoms worsen or fail to improve.   Orders:  Orders Placed This Encounter  Procedures  . XR Lumbar Spine 2-3 Views  . CT LUMBAR SPINE W WO CONTRAST   No orders of the defined types were placed in this encounter.     Procedures: No procedures performed   Clinical Data: No additional findings.   Subjective: Chief Complaint  Patient presents with  . Lower Back - Pain    Fell, landing on buttocks 2 weeks ago    Patient is a 84 year old gentleman who had a fall 2 weeks ago onto his buttock.  He states he has significant low back pain with radiation into his legs.  He does have baseline numbness and tingling into his legs and feet.  Denies any bowel or bladder dysfunction.   Review of Systems  Constitutional: Negative.   All other systems reviewed and are negative.    Objective: Vital Signs: There were no vitals taken for this visit.  Physical Exam  Constitutional: He is oriented to person, place, and time. He appears well-developed and well-nourished.  Pulmonary/Chest: Effort normal.  Abdominal: Soft.  Neurological: He is alert and oriented to person, place, and time.  Skin: Skin is warm.  Psychiatric: He has a normal mood and affect. His behavior is normal. Judgment and thought content normal.  Nursing note and vitals reviewed.   Ortho Exam Low back exam shows  tenderness palpation across the low back.  No obvious palpable defects.  Negative straight leg. Specialty Comments:  No specialty comments available.  Imaging: Xr Lumbar Spine 2-3 Views  Result Date: 08/11/2017 Severe degenerative scoliosis.  Unable to determine if there are any acute injuries.    PMFS History: Patient Active Problem List   Diagnosis Date Noted  . Weakness of right leg 04/20/2017  . 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 Impingement syndrome of right shoulder 06/16/2016  . Impingement syndrome of left shoulder 06/16/2016  . Bilateral leg edema 06/14/2016  . Dizziness and giddiness 06/26/2015  . Abnormality of gait 06/26/2015  . Anxiety 04/14/2015  . GERD (gastroesophageal reflux disease) 04/03/2015  . OSA (obstructive sleep apnea) 04/03/2015  . HLD (hyperlipidemia) 04/03/2015  . SOB (shortness of breath)   . Left elbow fracture 03/29/2015  . Dizziness 01/01/2015  . LBP (low back pain) 01/01/2015  . Malignant neoplasm of prostate ([place]) 01/01/2015  . BP (high blood pressure) 01/01/2015  . DOE (dyspnea on exertion) 10/26/2013  . Essential hypertension, benign 10/26/2013  . Post-traumatic wound infection 10/17/2012  . Cellulitis 10/17/2012  . Atrial fibrillation ([place]) 10/22/2011  . DVT, lower extremity, distal, chronic ([place]) 10/22/2011  . Pacemaker 10/19/2011  .  Prostate cancer ([place]) 10/19/2011  . COPD (chronic obstructive pulmonary disease) with emphysema ([place]) 02/04/2011  . Anemia 12/10/2010   Past Medical History:  Diagnosis Date  . Abnormality of gait    due to low back pain , uses  cane and walker  . Arthritis   . Bilateral lower extremity edema    wears TED hose  . Cardiac pacemaker in situ 10/11/2006   medtronic  . Chronic iron deficiency anemia oncologist/ hematoloist-- dr Alen Blew   treatment IV Iron infusions  . Chronic low back pain   . CKD (chronic kidney disease), stage III ([place])   . COPD with  emphysema [facility])    pulmologist-  dr Elsworth Soho  . DDD (degenerative disc disease), lumbosacral   . Degenerative scoliosis   . Full dentures   . GERD (gastroesophageal reflux disease)   . Gross hematuria   . Hereditary and idiopathic peripheral neuropathy    bilateral lower leg  . Hiatal hernia   . History of DVT of lower extremity 10/19/2011   chronic distal popliteal right lower extremity  . History of external beam radiation therapy 2008   prostate cancer  . History of pancreatitis 2013  . History of pneumothorax 09/2006   iatrogenic-- in setting cardiac pacemaker placement  . History of prostate cancer 2008   s/p  external radiation therapy  . Hypertension   . Lesion of bladder   . Macular degeneration of right eye   . Nocturia   . OSA on CPAP   . PAF (paroxysmal atrial fibrillation) ([place]) 10/2011   1st episode documented 09/ 2013 admission for gallstons/ pancreatitis and prior to cholecystectomy , went back in NSR without interventeion  . Sinus node dysfunction (HCC)    s/p  cardiac pacemaker placement 10-11-2006  . Wears glasses   . Wears hearing aid in both ears     Family History  Problem Relation Age of Onset  . Heart disease Father   . Heart disease Mother   . Colon cancer Mother   . Stroke Mother   . Heart attack Neg Hx   . Hypertension Neg Hx     Past Surgical History:  Procedure Laterality Date  . BALLOON DILATION N/A 01/14/2015   Procedure: BALLOON DILATION;  Surgeon: Garlan Fair, MD;  Location: [facility];  Service: Endoscopy;  Laterality: N/A;  . CARDIAC CATHETERIZATION  08-29-2000   [place]   no significant obstructive CAD, intact globle LV size and systolic function with regional wall motion abnormalities noted,  ?coronary spasm producing wall motion abnormality, elevated CPK and chest pain (ef 55%)  . CARDIAC PACEMAKER PLACEMENT  10-11-2006    dr Leonia Reeves   W/  ATRIAL LEAD REVISION 10-12-2006   (medtronic)  . CARDIOVASCULAR STRESS TEST  11-18-2011    dr  Irish Lack   Low risk nuclear study w/ no ishemia/  normal wall motion,  post-stress ef 48% (LVEF 56% on prior study 2008)  . CARPAL TUNNEL RELEASE Left 11/14/2014   Procedure: CARPAL TUNNEL RELEASE LEFT THUMB;  Surgeon: Daryll Brod, MD;  Location: [place];  Service: Orthopedics;  Laterality: Left;  ANESTHESIA:  IV REGIONAL FAB  . CATARACT EXTRACTION W/ INTRAOCULAR LENS  IMPLANT, BILATERAL    . CHOLECYSTECTOMY  10/23/2011  . CHOLECYSTECTOMY  10/23/2011   Procedure: CHOLECYSTECTOMY;  Surgeon: Rolm Bookbinder, MD;  Location: [place];  Service: General;  Laterality: N/A;  with intraoperative cholangiogram  . ESOPHAGOGASTRODUODENOSCOPY (EGD) WITH PROPOFOL N/A 01/14/2015   Procedure: ESOPHAGOGASTRODUODENOSCOPY (EGD) WITH  PROPOFOL;  Surgeon: Garlan Fair, MD;  Location: [facility];  Service: Endoscopy;  Laterality: N/A;  . I&D EXTREMITY Left 10/20/2012   Procedure: LEFT LEG IRRIGATION AND DEBRIDEMENT, AND WOUND VAC APPLICATION;  Surgeon: Marianna Payment, MD;  Location: [facility];  Service: Orthopedics;  Laterality: Left;  . I&D EXTREMITY Left 10/22/2012   Procedure: IRRIGATION AND DEBRIDEMENT EXTREMITY;  Surgeon: Marianna Payment, MD;  Location[facility];  Service: Orthopedics;  Laterality: Left;  . INGUINAL HERNIA REPAIR Left yrs ago  . LAPAROSCOPIC ASSISTED VENTRAL HERNIA REPAIR  08-08-2009   dr Excell Seltzer   AND OPEN REPAIR RECURRENT LEFT INGUINAL HERNIA  . LAPAROSCOPIC INGUINAL HERNIA REPAIR Left 07-24-1999    dr Excell Seltzer  . OPEN VENTRAL HERNIA REPAIR /  LYSIS ADHESIONS  09-23-2010    dr Donne Hazel  . ORIF ELBOW FRACTURE Left 03/29/2015   Procedure: LEFT ELBOW OPEN REDUCTION INTERNAL FIXATION (ORIF) DISTAL HUMERUS FRACTURE WITH OLECRANON OSTEOTOMY AND ULNAR NERVE RELEASE;  Surgeon: Roseanne Kaufman, MD;  Location: [place];  Service: Orthopedics;  Laterality: Left;  . REPAIR SUPRAUMBILICAL HERNIA   23-30-0762   dr Excell Seltzer  . SHOULDER ARTHROSCOPY WITH DISTAL CLAVICLE RESECTION Right 11-07-2007    dr Rhona Raider   [place] ACROMIOPLASTY  . SKIN SPLIT GRAFT Left 10/22/2012   Procedure: SKIN GRAFT SPLIT THICKNESS;  Surgeon: Marianna Payment, MD;  Location[facility];  Service: Orthopedics;  Laterality: Left;  . TOTAL KNEE ARTHROPLASTY Right 1990's  . TRANSTHORACIC ECHOCARDIOGRAM  06-12-2012   dr Irish Lack   ef 50-55%/ mild LAE/ mild MR and TR/  AV sclerosis without stenosis/  RVSP 48mmHg  . TRANSURETHRAL RESECTION OF BLADDER TUMOR N/A 07/15/2017   Procedure: CYSTOSCOPY TRANSURETHRAL RESECTION OF BLADDER TUMOR (TURBT);  Surgeon: Kathie Rhodes, MD;  Location: [facility];  Service: Urology;  Laterality: N/A;   Social History   Occupational History  . 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 Smoking status: Former Smoker    Packs/day: 1.50    Years: 35.00    Pack years: 52.50    Types: Cigarettes    Last attempt to quit: 02/15/1973    Years since quitting: 44.5  . Smokeless tobacco: Never Used  Substance and Sexual Activity  . Alcohol use: No  . Drug use: No  . Sexual activity: Never

## 2017-08-16 ENCOUNTER — Ambulatory Visit
Admission: RE | Admit: 2017-08-16 | Discharge: 2017-08-16 | Disposition: A | Discharge status: Home / Self Care | Payer: Medicare Other | Source: Ambulatory Visit | Attending: Orthopaedic Surgery | Admitting: Orthopaedic Surgery

## 2017-08-16 DIAGNOSIS — M545 Low back pain: Secondary | ICD-10-CM | POA: Diagnosis not present

## 2017-08-16 DIAGNOSIS — H353211 Exudative age-related macular degeneration, right eye, with active choroidal neovascularization: Secondary | ICD-10-CM | POA: Diagnosis not present

## 2017-08-16 DIAGNOSIS — M5442 Lumbago with sciatica, left side: Principal | ICD-10-CM

## 2017-08-16 DIAGNOSIS — H35723 Serous detachment of retinal pigment epithelium, bilateral: Secondary | ICD-10-CM | POA: Diagnosis not present

## 2017-08-16 DIAGNOSIS — M5441 Lumbago with sciatica, right side: Secondary | ICD-10-CM

## 2017-08-16 DIAGNOSIS — H353122 Nonexudative age-related macular degeneration, left eye, intermediate dry stage: Secondary | ICD-10-CM | POA: Diagnosis not present

## 2017-08-16 DIAGNOSIS — H35363 Drusen (degenerative) of macula, bilateral: Secondary | ICD-10-CM | POA: Diagnosis not present

## 2017-08-17 NOTE — Progress Notes (Signed)
Please call him to see how he's feeling.  CT scan was negative for fracture.

## 2017-08-26 ENCOUNTER — Encounter (HOSPITAL_BASED_OUTPATIENT_CLINIC_OR_DEPARTMENT_OTHER): Payer: Medicare Other | Attending: Internal Medicine

## 2017-08-29 ENCOUNTER — Ambulatory Visit (INDEPENDENT_AMBULATORY_CARE_PROVIDER_SITE_OTHER): Payer: Medicare Other | Admitting: *Deleted

## 2017-08-29 DIAGNOSIS — I495 Sick sinus syndrome: Secondary | ICD-10-CM | POA: Diagnosis not present

## 2017-08-30 ENCOUNTER — Ambulatory Visit (INDEPENDENT_AMBULATORY_CARE_PROVIDER_SITE_OTHER): Payer: Medicare Other | Admitting: Adult Health

## 2017-08-30 ENCOUNTER — Encounter: Payer: Self-pay | Admitting: Adult Health

## 2017-08-30 ENCOUNTER — Ambulatory Visit: Payer: Self-pay | Admitting: Adult Health

## 2017-08-30 VITALS — BP 124/64 | HR 93 | Ht 70.0 in | Wt 174.8 lb

## 2017-08-30 DIAGNOSIS — J432 Centrilobular emphysema: Secondary | ICD-10-CM

## 2017-08-30 DIAGNOSIS — G4733 Obstructive sleep apnea (adult) (pediatric): Secondary | ICD-10-CM

## 2017-08-30 LAB — CUP PACEART REMOTE DEVICE CHECK
Battery Impedance: 3253 Ohm
Battery Remaining Longevity: 15 mo
Battery Voltage: 2.68 V
Brady Statistic AP VP Percent: 2 %
Brady Statistic AS VS Percent: 29 %
Date Time Interrogation Session: 20190715143350
Implantable Lead Location: 753859
Implantable Lead Model: 5076
Implantable Lead Model: 5076
Implantable Pulse Generator Implant Date: 20080826
Lead Channel Impedance Value: 450 Ohm
Lead Channel Pacing Threshold Amplitude: 0.875 V
Lead Channel Setting Pacing Amplitude: 1.75 V
Lead Channel Setting Pacing Amplitude: 2.5 V
Lead Channel Setting Pacing Pulse Width: 0.4 ms
Lead Channel Setting Sensing Sensitivity: 2 mV
MDC IDC LEAD IMPLANT DT: 20080826
MDC IDC LEAD IMPLANT DT: 20080826
MDC IDC LEAD LOCATION: 753860
MDC IDC MSMT LEADCHNL RA PACING THRESHOLD PULSEWIDTH: 0.4 ms
MDC IDC MSMT LEADCHNL RV IMPEDANCE VALUE: 543 Ohm
MDC IDC MSMT LEADCHNL RV PACING THRESHOLD AMPLITUDE: 1.25 V
MDC IDC MSMT LEADCHNL RV PACING THRESHOLD PULSEWIDTH: 0.4 ms
MDC IDC STAT BRADY AP VS PERCENT: 68 %
MDC IDC STAT BRADY AS VP PERCENT: 1 %

## 2017-08-30 NOTE — Progress Notes (Signed)
@Patient  ID: [name] [name], male    DOB: Aug 06, 1924, 84 y.o.   MRN: [id number]  Chief Complaint  Patient presents with  . Follow-up    COPD     Referring provider: [name], MD  HPI: 84 year old male former smoker followed for COPD and obstructive sleep apnea  TEST  Significant tests/ events [place] 2015: FEV1 1.92 (63%), ratio 55   08/30/2017 Follow up : COPD and OSA  Patient presents for a 6 months follow up . Marland Kitchen  Patient has underlying moderate COPD.  He remains on Anoro.  Says overall his breathing is doing about the same. Gets winded with minimal activity . Uses walker to get around .  No increased cough or congestion .  Wants to check spirometry today , wants to know his lung fxn .  Spirometry today shows stable lung function with FEV1 at 68%, ratio 65, FVC 71%.   Patient has sleep apnea.  Is on CPAP at bedtime.  Patient feels rested and feels he is benefiting from CPAP.  Download shows poor compliance. Says he got new mask and works better now.  Feels rested.    Pneumovax and Prevnar vaccines are up-to-date  No Known Allergies  Immunization History  Administered Date(s) Administered  . Influenza Split 11/02/2015  . Influenza, High Dose Seasonal PF 11/14/2016  . Influenza,inj,Quad PF,6+ Mos 10/16/2012  . Influenza-Unspecified 11/15/2013  . Pneumococcal Conjugate-13 06/03/2014  . Pneumococcal-Unspecified 05/04/2013  . Tdap 10/05/2012  . Zoster 02/16/2011  . Zoster Recombinat (Shingrix) 04/15/2017, 06/15/2017    Past Medical History:  Diagnosis Date  . Abnormality of gait    due to low back pain , uses  cane and walker  . Arthritis   . Bilateral lower extremity edema    wears TED hose  . Cardiac pacemaker in situ 10/11/2006   medtronic  . Chronic iron deficiency anemia oncologist/ hematoloist-- dr Alen Blew   treatment IV Iron infusions  . Chronic low back pain   . CKD (chronic kidney disease), stage III ([place])   . COPD with emphysema [facility])    pulmologist-  dr Elsworth Soho  . DDD (degenerative disc disease), lumbosacral   . Degenerative scoliosis   . Full dentures   . GERD (gastroesophageal reflux disease)   . Gross hematuria   . Hereditary and idiopathic peripheral neuropathy    bilateral lower leg  . Hiatal hernia   . History of DVT of lower extremity 10/19/2011   chronic distal popliteal right lower extremity  . History of external beam radiation therapy 2008   prostate cancer  . History of pancreatitis 2013  . History of pneumothorax 09/2006   iatrogenic-- in setting cardiac pacemaker placement  . History of prostate cancer 2008   s/p  external radiation therapy  . Hypertension   . Lesion of bladder   . Macular degeneration of right eye   . Nocturia   . OSA on CPAP   . PAF (paroxysmal atrial fibrillation) ([place]) 10/2011   1st episode documented 09/ 2013 admission for gallstons/ pancreatitis and prior to cholecystectomy , went back in NSR without interventeion  . Sinus node dysfunction (HCC)    s/p  cardiac pacemaker placement 10-11-2006  . Wears glasses   . Wears hearing aid in both ears     Tobacco History: Social History   Tobacco Use  Smoking Status Former Smoker  . Packs/day: 1.50  . Years: 35.00  . Pack years: 52.50  . Types: Cigarettes  . Last attempt  to quit: 02/15/1973  . Years since quitting: 44.5  Smokeless Tobacco Never Used   Counseling given: Not Answered   Outpatient Medications Prior to Visit  Medication Sig Dispense Refill  . acetaminophen (TYLENOL) 650 MG CR tablet Take 650 mg by mouth 2 (two) times daily.    Marland Kitchen albuterol (PROVENTIL HFA;VENTOLIN HFA) 108 (90 Base) MCG/ACT inhaler Inhale 2 puffs into the lungs every 6 (six) hours as needed for wheezing or shortness of breath. 1 Inhaler 6  . aspirin EC 81 MG tablet Take 81 mg by mouth daily.    . Calcium Carbonate-Vitamin D (CALCIUM-D PO) Take 1 tablet by mouth daily.    Marland Kitchen docusate sodium (COLACE) 100 MG capsule Take 100 mg by mouth daily.    .  ferrous sulfate 325 (65 FE) MG tablet Take 325 mg by mouth 2 (two) times daily with a meal.     . furosemide (LASIX) 20 MG tablet Take 20-40 mg by mouth See admin instructions. Take 20mg  daily.  May take an additional 20mg  for leg swelling    . lisinopril (PRINIVIL,ZESTRIL) 10 MG tablet Take 10 mg by mouth every evening.    . Multiple Vitamins-Minerals (PRESERVISION AREDS PO) Take 1 tablet by mouth 2 (two) times daily.    Marland Kitchen omeprazole (PRILOSEC) 20 MG capsule Take 20 mg by mouth every morning.     Marland Kitchen oxybutynin (DITROPAN XL) 15 MG 24 hr tablet Take 15 mg by mouth 2 (two) times a week.     Marland Kitchen oxyCODONE (OXY IR/ROXICODONE) 5 MG immediate release tablet Take 5 mg by mouth every 6 (six) hours as needed for severe pain.    . predniSONE (DELTASONE) 10 MG tablet Take 1 tablet (10 mg total) by mouth daily with breakfast. (Patient taking differently: Take 10 mg by mouth daily with breakfast. ) 30 tablet 0  . Umeclidinium-Vilanterol (ANORO ELLIPTA) 62.5-25 MCG/INH AEPB Inhale 1 puff into the lungs every morning.     . vitamin B-12 (CYANOCOBALAMIN) 500 MCG tablet Take 500 mcg by mouth daily.     Marland Kitchen oxyCODONE-acetaminophen (PERCOCET/ROXICET) 5-325 MG tablet Take 1 tablet by mouth every 6 (six) hours as needed for severe pain.    . phenazopyridine (PYRIDIUM) 100 MG tablet Take 1 tablet (100 mg total) by mouth 3 (three) times daily as needed for pain. (Patient not taking: Reported on 08/30/2017) 10 tablet 0   No facility-administered medications prior to visit.      Review of Systems  Constitutional:   No  weight loss, night sweats,  Fevers, chills +fatigue, or  lassitude.  HEENT:   No headaches,  Difficulty swallowing,  Tooth/dental problems, or  Sore throat,                No sneezing, itching, ear ache, nasal congestion, post nasal drip,   CV:  No chest pain,  Orthopnea, PND, swelling in lower extremities, anasarca, dizziness, palpitations, syncope.   GI  No heartburn, indigestion, abdominal pain,  nausea, vomiting, diarrhea, change in bowel habits, loss of appetite, bloody stools.   Resp:   No chest wall deformity  Skin: no rash or lesions.  GU: no dysuria, change in color of urine, no urgency or frequency.  No flank pain, no hematuria   MS:  No joint pain or swelling.  No decreased range of motion.  No back pain.    Physical Exam  BP 124/64 (BP Location: Left Arm, Cuff Size: Normal)   Pulse 93   Ht 5\' 10"  (1.778 m)  Wt 174 lb 12.8 oz (79.3 kg)   SpO2 95%   BMI 25.08 kg/m   GEN: A/Ox3; pleasant , NAD, elderly    HEENT:  [place]/AT,  EACs-clear, TMs-wnl, NOSE-clear, THROAT-clear, no lesions, no postnasal drip or exudate noted.   NECK:  Supple w/ fair ROM; no JVD; normal carotid impulses w/o bruits; no thyromegaly or nodules palpated; no lymphadenopathy.    RESP  Decreased BS in bases  no accessory muscle use, no dullness to percussion  CARD:  RRR, no m/r/g, no peripheral edema, pulses intact, no cyanosis or clubbing.  GI:   Soft & nt; nml bowel sounds; no organomegaly or masses detected.   Musco: Warm bil, no deformities or joint swelling noted.   Neuro: alert, no focal deficits noted.    Skin: Warm, no lesions or rashes    Lab Results:  CBC  BMET   BNP No results found for: BNP   Imaging:    Assessment & Plan:   COPD (chronic obstructive pulmonary disease) with emphysema (HCC) Stable on current regimen   Plan  Patient Instructions  Continue on Anoro 1 puff daily, rinse after use Continue on CPAP at bedtime. Do not drive a sleepy Work on healthy weight Follow-up with [name] in 6 months and as needed     OSA (obstructive sleep apnea) Controlled on CPAP  Encouraged on compliance       [name], NP 08/30/2017

## 2017-08-30 NOTE — Assessment & Plan Note (Signed)
Controlled on CPAP  Encouraged on compliance

## 2017-08-30 NOTE — Assessment & Plan Note (Signed)
Stable on current regimen   Plan  Patient Instructions  Continue on Anoro 1 puff daily, rinse after use Continue on CPAP at bedtime. Do not drive a sleepy Work on healthy weight Follow-up with [name] in 6 months and as needed

## 2017-08-30 NOTE — Progress Notes (Signed)
Remote pacemaker transmission.   

## 2017-08-30 NOTE — Patient Instructions (Signed)
Continue on Anoro 1 puff daily, rinse after use Continue on CPAP at bedtime. Do not drive a sleepy Work on healthy weight Follow-up with [name] in 6 months and as needed

## 2017-08-31 ENCOUNTER — Encounter: Payer: Self-pay | Admitting: Cardiology

## 2017-09-01 ENCOUNTER — Ambulatory Visit: Payer: Medicare Other | Admitting: Podiatry

## 2017-09-01 ENCOUNTER — Encounter: Payer: Self-pay | Admitting: Podiatry

## 2017-09-01 ENCOUNTER — Ambulatory Visit (INDEPENDENT_AMBULATORY_CARE_PROVIDER_SITE_OTHER): Payer: Medicare Other | Admitting: Podiatry

## 2017-09-01 DIAGNOSIS — M7751 Other enthesopathy of right foot: Secondary | ICD-10-CM | POA: Diagnosis not present

## 2017-09-01 DIAGNOSIS — M7752 Other enthesopathy of left foot: Secondary | ICD-10-CM

## 2017-09-01 DIAGNOSIS — M779 Enthesopathy, unspecified: Secondary | ICD-10-CM

## 2017-09-01 MED ORDER — TRIAMCINOLONE ACETONIDE 10 MG/ML IJ SUSP
10.0000 mg | Freq: Once | INTRAMUSCULAR | Status: AC
Start: 2017-09-01 — End: 2017-09-01
  Administered 2017-09-01: 10 mg

## 2017-09-01 NOTE — Progress Notes (Signed)
Subjective:   Patient ID: [name] [name], male   DOB: 84 y.o.   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

## 2017-09-05 ENCOUNTER — Ambulatory Visit: Payer: Medicare Other | Admitting: Podiatry

## 2017-09-05 DIAGNOSIS — G894 Chronic pain syndrome: Secondary | ICD-10-CM | POA: Diagnosis not present

## 2017-09-05 DIAGNOSIS — Z79891 Long term (current) use of opiate analgesic: Secondary | ICD-10-CM | POA: Diagnosis not present

## 2017-09-05 DIAGNOSIS — M546 Pain in thoracic spine: Secondary | ICD-10-CM | POA: Diagnosis not present

## 2017-09-05 DIAGNOSIS — M48062 Spinal stenosis, lumbar region with neurogenic claudication: Secondary | ICD-10-CM | POA: Diagnosis not present

## 2017-09-06 ENCOUNTER — Encounter (INDEPENDENT_AMBULATORY_CARE_PROVIDER_SITE_OTHER): Payer: Self-pay | Admitting: Orthopedic Surgery

## 2017-09-06 ENCOUNTER — Ambulatory Visit (INDEPENDENT_AMBULATORY_CARE_PROVIDER_SITE_OTHER): Payer: Medicare Other | Admitting: Orthopedic Surgery

## 2017-09-06 VITALS — Ht 70.0 in | Wt 175.0 lb

## 2017-09-06 DIAGNOSIS — M5442 Lumbago with sciatica, left side: Secondary | ICD-10-CM

## 2017-09-06 DIAGNOSIS — L97911 Non-pressure chronic ulcer of unspecified part of right lower leg limited to breakdown of skin: Secondary | ICD-10-CM

## 2017-09-06 DIAGNOSIS — M5441 Lumbago with sciatica, right side: Secondary | ICD-10-CM

## 2017-09-06 NOTE — Progress Notes (Signed)
Office Visit Note   Patient: [name] [name]           Date of Birth: [birth year]           MRN: [id number] Visit Date: 09/06/2017              Requested by: [name], MD [place], [place] [address] PCP: [name], MD  Chief Complaint  Patient presents with  . Lower Back - Pain  . Left Hip - Pain  . Bilateral Knee pain      HPI: Patient is a 84 year old gentleman who is seen for evaluation for chronic lower back pain.  Patient states he had a recent fall CT scan showed no acute fractures with spinal stenosis.  Patient states that he has been working with Nicholaus Bloom he has had a half dozen epidural steroid injections without relief.  Patient complains of pain on the left side of the sacroiliac joint.  He is currently on prednisone 10 mg a day he has developed new venous stasis ulcers on the right leg.  Assessment & Plan: Visit Diagnoses:  1. Midline low back pain with bilateral sciatica, unspecified chronicity   2. Traumatic ulcer of right lower leg, limited to breakdown of skin ([place])     Plan: Recommended heat for the SI joint as well as continuing the prednisone 10 mg a day.  Recommended the medical compression stockings to be worn around-the-clock for the traumatic venous ulcers on the right leg.  Follow-Up Instructions: Return in about 3 weeks (around 09/27/2017).   Ortho Exam  Patient is alert, oriented, no adenopathy, well-dressed, normal affect, normal respiratory effort. Examination patient is point tender to palpation of the left sacroiliac joint he has a negative straight leg raise bilaterally.  Review of the CT scan shows no acute fractures but he does have spinal stenosis with multilevel disease.  He has no focal motor weakness in both lower extremities but he does have difficulty getting from sitting to a standing position and has a slow gait with his rolling walker.  He has traumatic ulcers with good granulation tissue on the right leg.   He has pitting edema with brawny skin color changes.  Imaging: No results found. No images are attached to the encounter.  Labs: Lab Results  Component Value Date   CRP 16.2 (H) 10/20/2011   LABURIC 4.7 10/17/2006   REPTSTATUS 03/31/2015 FINAL 03/30/2015   GRAMSTAIN Abundant 12/18/2015   GRAMSTAIN WBC present-predominately PMN 12/18/2015   GRAMSTAIN No Squamous Epithelial Cells Seen 12/18/2015   GRAMSTAIN No Organisms Seen 12/18/2015   CULT 9,000 COLONIES/mL INSIGNIFICANT GROWTH 03/30/2015   LABORGA NORMAL SKIN FLORA 12/18/2015     Lab Results  Component Value Date   ALBUMIN 4.1 05/18/2016   ALBUMIN 3.2 (L) 10/17/2012   ALBUMIN 1.9 (L) 10/24/2011   LABURIC 4.7 10/17/2006    Body mass index is 25.11 kg/m.  Orders:  No orders of the defined types were placed in this encounter.  No orders of the defined types were placed in this encounter.    Procedures: No procedures performed  Clinical Data: No additional findings.  ROS:  All other systems negative, except as noted in the HPI. Review of Systems  Objective: Vital Signs: Ht 5\' 10"  (1.778 m)   Wt 175 lb (79.4 kg)   BMI 25.11 kg/m   Specialty Comments:  No specialty comments available.  PMFS History: Patient Active Problem List   Diagnosis Date Noted  .  PCO (posterior capsular opacification), bilateral 05/26/2017  . Weakness of right leg 04/20/2017  . 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 Exudative age-related macular degeneration of right eye with active choroidal neovascularization ([place]) 09/30/2016  . Pseudophakia of both eyes 09/30/2016  . Impingement syndrome of right shoulder 06/16/2016  . Impingement syndrome of left shoulder 06/16/2016  . Bilateral leg edema 06/14/2016  . Dizziness and giddiness 06/26/2015  . Abnormality of gait 06/26/2015  . Anxiety 04/14/2015  . GERD (gastroesophageal reflux disease) 04/03/2015  . OSA (obstructive sleep apnea)  04/03/2015  . HLD (hyperlipidemia) 04/03/2015  . SOB (shortness of breath)   . Left elbow fracture 03/29/2015  . Dizziness 01/01/2015  . LBP (low back pain) 01/01/2015  . Malignant neoplasm of prostate ([place]) 01/01/2015  . BP (high blood pressure) 01/01/2015  . DOE (dyspnea on exertion) 10/26/2013  . Essential hypertension, benign 10/26/2013  . Post-traumatic wound infection 10/17/2012  . Cellulitis 10/17/2012  . Atrial fibrillation ([place]) 10/22/2011  . DVT, lower extremity, distal, chronic ([place]) 10/22/2011  . Pacemaker 10/19/2011  . Prostate cancer ([place]) 10/19/2011  . COPD (chronic obstructive pulmonary disease) with emphysema ([place]) 02/04/2011  . Anemia 12/10/2010   Past Medical History:  Diagnosis Date  . Abnormality of gait    due to low back pain , uses  cane and walker  . Arthritis   . Bilateral lower extremity edema    wears TED hose  . Cardiac pacemaker in situ 10/11/2006   medtronic  . Chronic iron deficiency anemia oncologist/ hematoloist-- dr Alen Blew   treatment IV Iron infusions  . Chronic low back pain   . CKD (chronic kidney disease), stage III ([place])   . COPD with emphysema [facility])    pulmologist-  dr Elsworth Soho  . DDD (degenerative disc disease), lumbosacral   . Degenerative scoliosis   . Full dentures   . GERD (gastroesophageal reflux disease)   . Gross hematuria   . Hereditary and idiopathic peripheral neuropathy    bilateral lower leg  . Hiatal hernia   . History of DVT of lower extremity 10/19/2011   chronic distal popliteal right lower extremity  . History of external beam radiation therapy 2008   prostate cancer  . History of pancreatitis 2013  . History of pneumothorax 09/2006   iatrogenic-- in setting cardiac pacemaker placement  . History of prostate cancer 2008   s/p  external radiation therapy  . Hypertension   . Lesion of bladder   . Macular degeneration of right eye   . Nocturia   . OSA on CPAP   . PAF (paroxysmal atrial fibrillation) ([place])  10/2011   1st episode documented 09/ 2013 admission for gallstons/ pancreatitis and prior to cholecystectomy , went back in NSR without interventeion  . Sinus node dysfunction (HCC)    s/p  cardiac pacemaker placement 10-11-2006  . Wears glasses   . Wears hearing aid in both ears     Family History  Problem Relation Age of Onset  . Heart disease Father   . Heart disease Mother   . Colon cancer Mother   . Stroke Mother   . Heart attack Neg Hx   . Hypertension Neg Hx     Past Surgical History:  Procedure Laterality Date  . BALLOON DILATION N/A 01/14/2015   Procedure: BALLOON DILATION;  Surgeon: Garlan Fair, MD;  Location: [facility];  Service: Endoscopy;  Laterality: N/A;  . CARDIAC CATHETERIZATION  08-29-2000  [place]   no significant obstructive CAD, intact globle LV size and systolic function with regional wall motion abnormalities noted,  ?coronary spasm producing wall motion abnormality, elevated CPK and chest pain (ef 55%)  . CARDIAC PACEMAKER PLACEMENT  10-11-2006    dr Leonia Reeves   W/  ATRIAL LEAD REVISION 10-12-2006   (medtronic)  . CARDIOVASCULAR STRESS TEST  11-18-2011    dr Irish Lack   Low risk nuclear study w/ no ishemia/  normal wall motion,  post-stress ef 48% (LVEF 56% on prior study 2008)  . CARPAL TUNNEL RELEASE Left 11/14/2014   Procedure: CARPAL TUNNEL RELEASE LEFT THUMB;  Surgeon: Daryll Brod, MD;  Location: [facility];  Service: Orthopedics;  Laterality: Left;  ANESTHESIA:  IV REGIONAL FAB  . CATARACT EXTRACTION W/ INTRAOCULAR LENS  IMPLANT, BILATERAL    . CHOLECYSTECTOMY  10/23/2011  . CHOLECYSTECTOMY  10/23/2011   Procedure: CHOLECYSTECTOMY;  Surgeon: Rolm Bookbinder, MD;  Location: [facility];  Service: General;  Laterality: N/A;  with intraoperative cholangiogram  . ESOPHAGOGASTRODUODENOSCOPY (EGD) WITH PROPOFOL N/A 01/14/2015   Procedure: ESOPHAGOGASTRODUODENOSCOPY (EGD) WITH PROPOFOL;  Surgeon: Garlan Fair, MD;  [facility];  Service:  Endoscopy;  Laterality: N/A;  . I&D EXTREMITY Left 10/20/2012   Procedure: LEFT LEG IRRIGATION AND DEBRIDEMENT, AND WOUND VAC APPLICATION;  Surgeon: Marianna Payment, MD;  [facility];  Service: Orthopedics;  Laterality: Left;  . I&D EXTREMITY Left 10/22/2012   Procedure: IRRIGATION AND DEBRIDEMENT EXTREMITY;  Surgeon: Marianna Payment, MD;  [facility];  Service: Orthopedics;  Laterality: Left;  . INGUINAL HERNIA REPAIR Left yrs ago  . LAPAROSCOPIC ASSISTED VENTRAL HERNIA REPAIR  08-08-2009   dr Excell Seltzer   AND OPEN REPAIR RECURRENT LEFT INGUINAL HERNIA  . LAPAROSCOPIC INGUINAL HERNIA REPAIR Left 07-24-1999    dr Excell Seltzer  . OPEN VENTRAL HERNIA REPAIR /  LYSIS ADHESIONS  09-23-2010    dr Donne Hazel  . ORIF ELBOW FRACTURE Left 03/29/2015   Procedure: LEFT ELBOW OPEN REDUCTION INTERNAL FIXATION (ORIF) DISTAL HUMERUS FRACTURE WITH OLECRANON OSTEOTOMY AND ULNAR NERVE RELEASE;  Surgeon: Roseanne Kaufman, MD;  Location: [facility];  Service: Orthopedics;  Laterality: Left;  . REPAIR SUPRAUMBILICAL HERNIA   08-11-9483   dr Excell Seltzer  . SHOULDER ARTHROSCOPY WITH DISTAL CLAVICLE RESECTION Right 11-07-2007   dr Rhona Raider   [place] ACROMIOPLASTY  . SKIN SPLIT GRAFT Left 10/22/2012   Procedure: SKIN GRAFT SPLIT THICKNESS;  Surgeon: Marianna Payment, MD;  [facility];  Service: Orthopedics;  Laterality: Left;  . TOTAL KNEE ARTHROPLASTY Right 1990's  . TRANSTHORACIC ECHOCARDIOGRAM  06-12-2012   dr Irish Lack   ef 50-55%/ mild LAE/ mild MR and TR/  AV sclerosis without stenosis/  RVSP 1mmHg  . TRANSURETHRAL RESECTION OF BLADDER TUMOR N/A 07/15/2017   Procedure: CYSTOSCOPY TRANSURETHRAL RESECTION OF BLADDER TUMOR (TURBT);  Surgeon: Kathie Rhodes, MD;  Location: [facility];  Service: Urology;  Laterality: N/A;   Social History   Occupational History  . 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 Smoking status: Former  Smoker    Packs/day: 1.50    Years: 35.00    Pack years: 52.50    Types: Cigarettes    Last attempt to quit: 02/15/1973    Years since quitting: 44.5  . Smokeless tobacco: Never Used  Substance and Sexual Activity  . Alcohol use: No  . Drug use: No  .  Sexual activity: Never

## 2017-09-08 DIAGNOSIS — K432 Incisional hernia without obstruction or gangrene: Secondary | ICD-10-CM | POA: Diagnosis not present

## 2017-09-22 DIAGNOSIS — K529 Noninfective gastroenteritis and colitis, unspecified: Secondary | ICD-10-CM | POA: Diagnosis not present

## 2017-09-22 DIAGNOSIS — R1110 Vomiting, unspecified: Secondary | ICD-10-CM | POA: Diagnosis not present

## 2017-09-22 DIAGNOSIS — R197 Diarrhea, unspecified: Secondary | ICD-10-CM | POA: Diagnosis not present

## 2017-09-27 ENCOUNTER — Encounter (INDEPENDENT_AMBULATORY_CARE_PROVIDER_SITE_OTHER): Payer: Self-pay | Admitting: Orthopedic Surgery

## 2017-09-27 ENCOUNTER — Ambulatory Visit (INDEPENDENT_AMBULATORY_CARE_PROVIDER_SITE_OTHER): Payer: Medicare Other | Admitting: Orthopedic Surgery

## 2017-09-27 VITALS — Ht 70.0 in | Wt 175.0 lb

## 2017-09-27 DIAGNOSIS — M5441 Lumbago with sciatica, right side: Secondary | ICD-10-CM | POA: Diagnosis not present

## 2017-09-27 DIAGNOSIS — L97911 Non-pressure chronic ulcer of unspecified part of right lower leg limited to breakdown of skin: Secondary | ICD-10-CM

## 2017-09-27 DIAGNOSIS — M5442 Lumbago with sciatica, left side: Secondary | ICD-10-CM

## 2017-09-27 NOTE — Progress Notes (Signed)
Office Visit Note   Patient: [name] [name]           Date of Birth: 1924-09-02           MRN: [id number] Visit Date: 09/27/2017              Requested by: [name], MD [place],  [id number] PCP: [name], MD  Chief Complaint  Patient presents with  . Right Leg - Wound Check      HPI: Patient is a 84 year old gentleman who presents in follow-up for degenerative disc disease spinal stenosis L3-4 with traumatic venous stasis ulcer right lower extremity patient has a new pair of medical compression stockings that he is wearing he is pleased with these and feel that the wounds have healed nicely.  Assessment & Plan: Visit Diagnoses:  1. Midline low back pain with bilateral sciatica, unspecified chronicity   2. Traumatic ulcer of right lower leg, limited to breakdown of skin ([place])     Plan: Patient is currently wearing an extra-large  Socks he actually could fit into a large but these are easier for him to get on.  He will continue wearing the socks the ulcers are almost completely healed he will follow-up with [place] as needed.  Patient is undergone multiple injections for his spinal stenosis without relief and does not want to pursue further injections.  Follow-Up Instructions: Return if symptoms worsen or fail to improve.   Ortho Exam  Patient is alert, oriented, no adenopathy, well-dressed, normal affect, normal respiratory effort. Examination patient has difficulty getting from a sitting to a standing position he has an antalgic gait he has brawny skin color changes in his legs with pitting edema in the right lower extremity there is no drainage no cellulitis no signs of infection.  The traumatic venous ulcers are almost completely healed with superficial scabs.  Patient has no radicular symptoms at this time he still has midline lower back pain around 3 4 we reviewed his 2 previous MRI scans which showed no significant change.  Imaging: No  results found. No images are attached to the encounter.  Labs: Lab Results  Component Value Date   CRP 16.2 (H) 10/20/2011   LABURIC 4.7 10/17/2006   REPTSTATUS 03/31/2015 FINAL 03/30/2015   GRAMSTAIN Abundant 12/18/2015   GRAMSTAIN WBC present-predominately PMN 12/18/2015   GRAMSTAIN No Squamous Epithelial Cells Seen 12/18/2015   GRAMSTAIN No Organisms Seen 12/18/2015   CULT 9,000 COLONIES/mL INSIGNIFICANT GROWTH 03/30/2015   LABORGA NORMAL SKIN FLORA 12/18/2015     Lab Results  Component Value Date   ALBUMIN 4.1 05/18/2016   ALBUMIN 3.2 (L) 10/17/2012   ALBUMIN 1.9 (L) 10/24/2011   LABURIC 4.7 10/17/2006    Body mass index is 25.11 kg/m.  Orders:  No orders of the defined types were placed in this encounter.  No orders of the defined types were placed in this encounter.    Procedures: No procedures performed  Clinical Data: No additional findings.  ROS:  All other systems negative, except as noted in the HPI. Review of Systems  Objective: Vital Signs: Ht 5\' 10"  (1.778 m)   Wt 175 lb (79.4 kg)   BMI 25.11 kg/m   Specialty Comments:  No specialty comments available.  PMFS History: Patient Active Problem List   Diagnosis Date Noted  . PCO (posterior capsular opacification), bilateral 05/26/2017  . Weakness of right leg 04/20/2017  . Myelopathy concurrent with and due to stenosis of  lumbar spine ([place]) 04/20/2017  . Exudative age-related macular degeneration of right eye with active choroidal neovascularization ([place]) 09/30/2016  . Pseudophakia of both eyes 09/30/2016  . Impingement syndrome of right shoulder 06/16/2016  . Impingement syndrome of left shoulder 06/16/2016  . Bilateral leg edema 06/14/2016  . Dizziness and giddiness 06/26/2015  . Abnormality of gait 06/26/2015  . Anxiety 04/14/2015  . GERD (gastroesophageal reflux disease) 04/03/2015  . OSA (obstructive sleep apnea) 04/03/2015  . HLD (hyperlipidemia) 04/03/2015  . SOB (shortness of  breath)   . Left elbow fracture 03/29/2015  . Dizziness 01/01/2015  . LBP (low back pain) 01/01/2015  . Malignant neoplasm of prostate ([place]) 01/01/2015  . BP (high blood pressure) 01/01/2015  . DOE (dyspnea on exertion) 10/26/2013  . Essential hypertension, benign 10/26/2013  . Post-traumatic wound infection 10/17/2012  . Cellulitis 10/17/2012  . Atrial fibrillation ([place]) 10/22/2011  . DVT, lower extremity, distal, chronic ([place]) 10/22/2011  . Pacemaker 10/19/2011  . Prostate cancer ([place]) 10/19/2011  . COPD (chronic obstructive pulmonary disease) with emphysema ([place]) 02/04/2011  . Anemia 12/10/2010   Past Medical History:  Diagnosis Date  . Abnormality of gait    due to low back pain , uses  cane and walker  . Arthritis   . Bilateral lower extremity edema    wears TED hose  . Cardiac pacemaker in situ 10/11/2006   medtronic  . Chronic iron deficiency anemia oncologist/ hematoloist-- dr Alen Blew   treatment IV Iron infusions  . Chronic low back pain   . CKD (chronic kidney disease), stage III ([place])   . COPD with emphysema [facility])    pulmologist-  dr Elsworth Soho  . DDD (degenerative disc disease), lumbosacral   . Degenerative scoliosis   . Full dentures   . GERD (gastroesophageal reflux disease)   . Gross hematuria   . Hereditary and idiopathic peripheral neuropathy    bilateral lower leg  . Hiatal hernia   . History of DVT of lower extremity 10/19/2011   chronic distal popliteal right lower extremity  . History of external beam radiation therapy 2008   prostate cancer  . History of pancreatitis 2013  . History of pneumothorax 09/2006   iatrogenic-- in setting cardiac pacemaker placement  . History of prostate cancer 2008   s/p  external radiation therapy  . Hypertension   . Lesion of bladder   . Macular degeneration of right eye   . Nocturia   . OSA on CPAP   . PAF (paroxysmal atrial fibrillation) ([place]) 10/2011   1st episode documented 09/ 2013 admission for gallstons/  pancreatitis and prior to cholecystectomy , went back in NSR without interventeion  . Sinus node dysfunction (HCC)    s/p  cardiac pacemaker placement 10-11-2006  . Wears glasses   . Wears hearing aid in both ears     Family History  Problem Relation Age of Onset  . Heart disease Father   . Heart disease Mother   . Colon cancer Mother   . Stroke Mother   . Heart attack Neg Hx   . Hypertension Neg Hx     Past Surgical History:  Procedure Laterality Date  . BALLOON DILATION N/A 01/14/2015   Procedure: BALLOON DILATION;  Surgeon: Garlan Fair, MD;  Location: [facility];  Service: Endoscopy;  Laterality: N/A;  . CARDIAC CATHETERIZATION  08-29-2000   [facility]   no significant obstructive CAD, intact globle LV size and systolic function with regional wall motion abnormalities noted,  ?coronary spasm  producing wall motion abnormality, elevated CPK and chest pain (ef 55%)  . CARDIAC PACEMAKER PLACEMENT  10-11-2006    dr Leonia Reeves   W/  ATRIAL LEAD REVISION 10-12-2006   (medtronic)  . CARDIOVASCULAR STRESS TEST  11-18-2011    dr Irish Lack   Low risk nuclear study w/ no ishemia/  normal wall motion,  post-stress ef 48% (LVEF 56% on prior study 2008)  . CARPAL TUNNEL RELEASE Left 11/14/2014   Procedure: CARPAL TUNNEL RELEASE LEFT THUMB;  Surgeon: Daryll Brod, MD;  Location: [facility];  Service: Orthopedics;  Laterality: Left;  ANESTHESIA:  IV REGIONAL FAB  . CATARACT EXTRACTION W/ INTRAOCULAR LENS  IMPLANT, BILATERAL    . CHOLECYSTECTOMY  10/23/2011  . CHOLECYSTECTOMY  10/23/2011   Procedure: CHOLECYSTECTOMY;  Surgeon: Rolm Bookbinder, MD;  Location: [facility];  Service: General;  Laterality: N/A;  with intraoperative cholangiogram  . ESOPHAGOGASTRODUODENOSCOPY (EGD) WITH PROPOFOL N/A 01/14/2015   Procedure: ESOPHAGOGASTRODUODENOSCOPY (EGD) WITH PROPOFOL;  Surgeon: Garlan Fair, MD;  [facility];  Service: Endoscopy;  Laterality: N/A;  . I&D EXTREMITY Left 10/20/2012    Procedure: LEFT LEG IRRIGATION AND DEBRIDEMENT, AND WOUND VAC APPLICATION;  Surgeon: Marianna Payment, MD;  [facility];  Service: Orthopedics;  Laterality: Left;  . I&D EXTREMITY Left 10/22/2012   Procedure: IRRIGATION AND DEBRIDEMENT EXTREMITY;  Surgeon: Marianna Payment, MD;  [facility];  Service: Orthopedics;  Laterality: Left;  . INGUINAL HERNIA REPAIR Left yrs ago  . LAPAROSCOPIC ASSISTED VENTRAL HERNIA REPAIR  08-08-2009   dr Excell Seltzer   AND OPEN REPAIR RECURRENT LEFT INGUINAL HERNIA  . LAPAROSCOPIC INGUINAL HERNIA REPAIR Left 07-24-1999    dr Excell Seltzer  . OPEN VENTRAL HERNIA REPAIR /  LYSIS ADHESIONS  09-23-2010    dr Donne Hazel  . ORIF ELBOW FRACTURE Left 03/29/2015   Procedure: LEFT ELBOW OPEN REDUCTION INTERNAL FIXATION (ORIF) DISTAL HUMERUS FRACTURE WITH OLECRANON OSTEOTOMY AND ULNAR NERVE RELEASE;  Surgeon: Roseanne Kaufman, MD;  Location: [facility];  Service: Orthopedics;  Laterality: Left;  . REPAIR SUPRAUMBILICAL HERNIA   [id number]   dr Excell Seltzer  . SHOULDER ARTHROSCOPY WITH DISTAL CLAVICLE RESECTION Right 11-07-2007   dr Rhona Raider   [place] ACROMIOPLASTY  . SKIN SPLIT GRAFT Left 10/22/2012   Procedure: SKIN GRAFT SPLIT THICKNESS;  Surgeon: Marianna Payment, MD;  [facility];  Service: Orthopedics;  Laterality: Left;  . TOTAL KNEE ARTHROPLASTY Right 1990's  . TRANSTHORACIC ECHOCARDIOGRAM  06-12-2012   dr Irish Lack   ef 50-55%/ mild LAE/ mild MR and TR/  AV sclerosis without stenosis/  RVSP 59mmHg  . TRANSURETHRAL RESECTION OF BLADDER TUMOR N/A 07/15/2017   Procedure: CYSTOSCOPY TRANSURETHRAL RESECTION OF BLADDER TUMOR (TURBT);  Surgeon: Kathie Rhodes, MD;  Location: [facility];  Service: Urology;  Laterality: N/A;   Social History   Occupational History  . 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 Smoking status: Former Smoker    Packs/day: 1.50    Years: 35.00    Pack years: 52.50      Types: Cigarettes    Last attempt to quit: 02/15/1973    Years since quitting: 44.6  . Smokeless tobacco: Never Used  Substance and Sexual Activity  . Alcohol use: No  . Drug use: No  . Sexual activity: Never

## 2017-09-29 DIAGNOSIS — K529 Noninfective gastroenteritis and colitis, unspecified: Secondary | ICD-10-CM | POA: Diagnosis not present

## 2017-09-29 DIAGNOSIS — R1110 Vomiting, unspecified: Secondary | ICD-10-CM | POA: Diagnosis not present

## 2017-09-29 DIAGNOSIS — E538 Deficiency of other specified B group vitamins: Secondary | ICD-10-CM | POA: Diagnosis not present

## 2017-09-29 DIAGNOSIS — R197 Diarrhea, unspecified: Secondary | ICD-10-CM | POA: Diagnosis not present

## 2017-09-29 DIAGNOSIS — D649 Anemia, unspecified: Secondary | ICD-10-CM | POA: Diagnosis not present

## 2017-10-11 ENCOUNTER — Other Ambulatory Visit: Payer: Self-pay

## 2017-10-11 ENCOUNTER — Encounter (HOSPITAL_COMMUNITY): Payer: Self-pay | Admitting: *Deleted

## 2017-10-12 ENCOUNTER — Encounter (HOSPITAL_COMMUNITY)
Admission: RE | Disposition: A | Discharge status: Home / Self Care | Payer: Self-pay | Source: Ambulatory Visit | Attending: Gastroenterology

## 2017-10-12 ENCOUNTER — Encounter (HOSPITAL_COMMUNITY): Payer: Self-pay

## 2017-10-12 ENCOUNTER — Ambulatory Visit (HOSPITAL_COMMUNITY): Payer: Medicare Other

## 2017-10-12 ENCOUNTER — Ambulatory Visit (HOSPITAL_COMMUNITY)
Admission: RE | Admit: 2017-10-12 | Discharge: 2017-10-12 | Disposition: A | Discharge status: Home / Self Care | Payer: Medicare Other | Source: Ambulatory Visit | Attending: Gastroenterology | Admitting: Gastroenterology

## 2017-10-12 DIAGNOSIS — I480 Paroxysmal atrial fibrillation: Secondary | ICD-10-CM | POA: Insufficient documentation

## 2017-10-12 DIAGNOSIS — Z800 Family history of malignant neoplasm of digestive organs: Secondary | ICD-10-CM | POA: Insufficient documentation

## 2017-10-12 DIAGNOSIS — K222 Esophageal obstruction: Secondary | ICD-10-CM | POA: Insufficient documentation

## 2017-10-12 DIAGNOSIS — R51 Headache: Secondary | ICD-10-CM | POA: Insufficient documentation

## 2017-10-12 DIAGNOSIS — N183 Chronic kidney disease, stage 3 (moderate): Secondary | ICD-10-CM | POA: Insufficient documentation

## 2017-10-12 DIAGNOSIS — I739 Peripheral vascular disease, unspecified: Secondary | ICD-10-CM | POA: Insufficient documentation

## 2017-10-12 DIAGNOSIS — M5137 Other intervertebral disc degeneration, lumbosacral region: Secondary | ICD-10-CM | POA: Insufficient documentation

## 2017-10-12 DIAGNOSIS — Z950 Presence of cardiac pacemaker: Secondary | ICD-10-CM | POA: Insufficient documentation

## 2017-10-12 DIAGNOSIS — Z86718 Personal history of other venous thrombosis and embolism: Secondary | ICD-10-CM | POA: Diagnosis not present

## 2017-10-12 DIAGNOSIS — J439 Emphysema, unspecified: Secondary | ICD-10-CM | POA: Diagnosis not present

## 2017-10-12 DIAGNOSIS — K319 Disease of stomach and duodenum, unspecified: Secondary | ICD-10-CM | POA: Diagnosis not present

## 2017-10-12 DIAGNOSIS — K219 Gastro-esophageal reflux disease without esophagitis: Secondary | ICD-10-CM | POA: Insufficient documentation

## 2017-10-12 DIAGNOSIS — G609 Hereditary and idiopathic neuropathy, unspecified: Secondary | ICD-10-CM | POA: Insufficient documentation

## 2017-10-12 DIAGNOSIS — R0600 Dyspnea, unspecified: Secondary | ICD-10-CM | POA: Insufficient documentation

## 2017-10-12 DIAGNOSIS — D509 Iron deficiency anemia, unspecified: Secondary | ICD-10-CM | POA: Insufficient documentation

## 2017-10-12 DIAGNOSIS — Z923 Personal history of irradiation: Secondary | ICD-10-CM | POA: Diagnosis not present

## 2017-10-12 DIAGNOSIS — M4150 Other secondary scoliosis, site unspecified: Secondary | ICD-10-CM | POA: Insufficient documentation

## 2017-10-12 DIAGNOSIS — H3530 Unspecified macular degeneration: Secondary | ICD-10-CM | POA: Diagnosis not present

## 2017-10-12 DIAGNOSIS — R269 Unspecified abnormalities of gait and mobility: Secondary | ICD-10-CM | POA: Insufficient documentation

## 2017-10-12 DIAGNOSIS — K2970 Gastritis, unspecified, without bleeding: Secondary | ICD-10-CM | POA: Insufficient documentation

## 2017-10-12 DIAGNOSIS — K449 Diaphragmatic hernia without obstruction or gangrene: Secondary | ICD-10-CM | POA: Insufficient documentation

## 2017-10-12 DIAGNOSIS — M1990 Unspecified osteoarthritis, unspecified site: Secondary | ICD-10-CM | POA: Diagnosis not present

## 2017-10-12 DIAGNOSIS — R600 Localized edema: Secondary | ICD-10-CM | POA: Diagnosis not present

## 2017-10-12 DIAGNOSIS — Z9841 Cataract extraction status, right eye: Secondary | ICD-10-CM | POA: Insufficient documentation

## 2017-10-12 DIAGNOSIS — R1319 Other dysphagia: Secondary | ICD-10-CM | POA: Diagnosis not present

## 2017-10-12 DIAGNOSIS — Z7982 Long term (current) use of aspirin: Secondary | ICD-10-CM | POA: Insufficient documentation

## 2017-10-12 DIAGNOSIS — Z8546 Personal history of malignant neoplasm of prostate: Secondary | ICD-10-CM | POA: Diagnosis not present

## 2017-10-12 DIAGNOSIS — H9193 Unspecified hearing loss, bilateral: Secondary | ICD-10-CM | POA: Insufficient documentation

## 2017-10-12 DIAGNOSIS — Z96651 Presence of right artificial knee joint: Secondary | ICD-10-CM | POA: Insufficient documentation

## 2017-10-12 DIAGNOSIS — K3189 Other diseases of stomach and duodenum: Secondary | ICD-10-CM | POA: Diagnosis not present

## 2017-10-12 DIAGNOSIS — I129 Hypertensive chronic kidney disease with stage 1 through stage 4 chronic kidney disease, or unspecified chronic kidney disease: Secondary | ICD-10-CM | POA: Insufficient documentation

## 2017-10-12 DIAGNOSIS — Z9842 Cataract extraction status, left eye: Secondary | ICD-10-CM | POA: Insufficient documentation

## 2017-10-12 DIAGNOSIS — Z79899 Other long term (current) drug therapy: Secondary | ICD-10-CM | POA: Insufficient documentation

## 2017-10-12 DIAGNOSIS — Z8249 Family history of ischemic heart disease and other diseases of the circulatory system: Secondary | ICD-10-CM | POA: Insufficient documentation

## 2017-10-12 DIAGNOSIS — Z823 Family history of stroke: Secondary | ICD-10-CM | POA: Insufficient documentation

## 2017-10-12 DIAGNOSIS — R1314 Dysphagia, pharyngoesophageal phase: Secondary | ICD-10-CM | POA: Insufficient documentation

## 2017-10-12 DIAGNOSIS — D631 Anemia in chronic kidney disease: Secondary | ICD-10-CM | POA: Insufficient documentation

## 2017-10-12 DIAGNOSIS — Z961 Presence of intraocular lens: Secondary | ICD-10-CM | POA: Insufficient documentation

## 2017-10-12 DIAGNOSIS — G4733 Obstructive sleep apnea (adult) (pediatric): Secondary | ICD-10-CM | POA: Insufficient documentation

## 2017-10-12 DIAGNOSIS — K209 Esophagitis, unspecified: Secondary | ICD-10-CM | POA: Diagnosis not present

## 2017-10-12 DIAGNOSIS — F419 Anxiety disorder, unspecified: Secondary | ICD-10-CM | POA: Insufficient documentation

## 2017-10-12 HISTORY — DX: Malignant (primary) neoplasm, unspecified: C80.1

## 2017-10-12 HISTORY — DX: Headache: R51

## 2017-10-12 HISTORY — DX: Presence of cardiac pacemaker: Z95.0

## 2017-10-12 HISTORY — PX: ESOPHAGOGASTRODUODENOSCOPY (EGD) WITH PROPOFOL: SHX5813

## 2017-10-12 HISTORY — DX: Dyspnea, unspecified: R06.00

## 2017-10-12 HISTORY — PX: BIOPSY: SHX5522

## 2017-10-12 HISTORY — DX: Headache, unspecified: R51.9

## 2017-10-12 HISTORY — PX: BALLOON DILATION: SHX5330

## 2017-10-12 SURGERY — ESOPHAGOGASTRODUODENOSCOPY (EGD) WITH PROPOFOL
Anesthesia: Monitor Anesthesia Care

## 2017-10-12 MED ORDER — SODIUM CHLORIDE 0.9 % IV SOLN: INTRAVENOUS | Status: DC

## 2017-10-12 MED ORDER — LIDOCAINE HCL (CARDIAC) PF 100 MG/5ML IV SOSY
PREFILLED_SYRINGE | INTRAVENOUS | Status: DC | PRN
  Administered 2017-10-12: 30 mg via INTRATRACHEAL

## 2017-10-12 MED ORDER — PROPOFOL 500 MG/50ML IV EMUL
INTRAVENOUS | Status: DC | PRN
  Administered 2017-10-12: 50 ug/kg/min via INTRAVENOUS

## 2017-10-12 MED ORDER — PANTOPRAZOLE SODIUM 40 MG PO TBEC: 40.0000 mg | DELAYED_RELEASE_TABLET | Freq: Every day | ORAL | 1 refills | Status: AC

## 2017-10-12 MED ORDER — PROPOFOL 10 MG/ML IV BOLUS
INTRAVENOUS | Status: DC | PRN
  Administered 2017-10-12 (×2): 20 mg via INTRAVENOUS

## 2017-10-12 MED ORDER — LACTATED RINGERS IV SOLN
INTRAVENOUS | Status: DC
  Administered 2017-10-12: 07:00:00 via INTRAVENOUS

## 2017-10-12 SURGICAL SUPPLY — 15 items

## 2017-10-12 NOTE — Discharge Instructions (Signed)

## 2017-10-12 NOTE — Anesthesia Preprocedure Evaluation (Addendum)
Anesthesia Evaluation  Patient identified by MRN, date of birth, ID band Patient awake    Reviewed: Allergy & Precautions, NPO status , Patient's Chart, lab work & pertinent test results  History of Anesthesia Complications Negative for: history of anesthetic complications  Airway Mallampati: II  TM Distance: >3 FB Neck ROM: Full    Dental  (+) Edentulous Upper, Edentulous Lower   Pulmonary shortness of breath, sleep apnea and Continuous Positive Airway Pressure Ventilation , COPD,  COPD inhaler, former smoker,    breath sounds clear to auscultation       Cardiovascular hypertension, Pt. on medications + Peripheral Vascular Disease and + DOE  + pacemaker  Rhythm:Regular Rate:Normal     Neuro/Psych  Headaches, PSYCHIATRIC DISORDERS Anxiety  Neuromuscular disease    GI/Hepatic Neg liver ROS, hiatal hernia, GERD  Medicated and Controlled,  Endo/Other  negative endocrine ROS  Renal/GU Renal disease     Musculoskeletal  (+) Arthritis ,   Abdominal   Peds  Hematology  (+) anemia ,   Anesthesia Other Findings Medtronic pacer: 68% A paced  Reproductive/Obstetrics                            Anesthesia Physical Anesthesia Plan  ASA: III  Anesthesia Plan: MAC   Post-op Pain Management:    Induction:   PONV Risk Score and Plan: 1 and Treatment may vary due to age or medical condition  Airway Management Planned: Nasal Cannula  Additional Equipment: None  Intra-op Plan:   Post-operative Plan:   Informed Consent: I have reviewed the patients History and Physical, chart, labs and discussed the procedure including the risks, benefits and alternatives for the proposed anesthesia with the patient or authorized representative who has indicated his/her understanding and acceptance.   Dental advisory given  Plan Discussed with: CRNA and Surgeon  Anesthesia Plan Comments:          Anesthesia Quick Evaluation

## 2017-10-12 NOTE — H&P (Signed)
Primary Care Physician:  [name], MD Primary Gastroenterologist:  Dr. Alessandra Bevels  Reason for Consultation:  Esophageal dysphagia  HPI: [name] [name] is a 84 y.o. male scheduled for today for EGD with possible balloon dilatation of lower esophageal stricture. History of chronic dysphagia. Last EGD by Dr. Wynetta Emery in 2016 showed large hiatal hernia and non obstructing lower esophageal ring which was dilated with 15-18 mm balloon. Denies any acute chest pain and shortness of breath.  Past Medical History:  Diagnosis Date  . Abnormality of gait    due to low back pain , uses  cane and walker  . Arthritis   . Bilateral lower extremity edema    wears TED hose  . Cancer [facility])    prostate- treated with radiation  . Cardiac pacemaker in situ 10/11/2006   medtronic  . Chronic iron deficiency anemia oncologist/ hematoloist-- dr Alen Blew   treatment IV Iron infusions  . Chronic low back pain   . CKD (chronic kidney disease), stage III ([place])   . COPD with emphysema [facility])    pulmologist-  dr Elsworth Soho  . DDD (degenerative disc disease), lumbosacral   . Degenerative scoliosis   . Dyspnea    with exertion  . Full dentures   . GERD (gastroesophageal reflux disease)   . Gross hematuria   . Headache    history of migraines- "way in the past"  . Hereditary and idiopathic peripheral neuropathy    bilateral lower leg  . Hiatal hernia   . History of DVT of lower extremity 10/19/2011   chronic distal popliteal right lower extremity  . History of external beam radiation therapy 2008   prostate cancer  . History of pancreatitis 2013  . History of pneumothorax 09/2006   iatrogenic-- in setting cardiac pacemaker placement  . History of prostate cancer 2008   s/p  external radiation therapy  . Hypertension   . Lesion of bladder   . Macular degeneration of right eye   . Nocturia   . OSA on CPAP   . PAF (paroxysmal atrial fibrillation) ([place]) 10/2011   1st episode documented 09/ 2013  admission for gallstons/ pancreatitis and prior to cholecystectomy , went back in NSR without interventeion  . Presence of permanent cardiac pacemaker   . Sinus node dysfunction (HCC)    s/p  cardiac pacemaker placement 10-11-2006  . Wears glasses   . Wears hearing aid in both ears     Past Surgical History:  Procedure Laterality Date  . BALLOON DILATION N/A 01/14/2015   Procedure: BALLOON DILATION;  Surgeon: Garlan Fair, MD;  Location: [facility];  Service: Endoscopy;  Laterality: N/A;  . CARDIAC CATHETERIZATION  08-29-2000   [place]   no significant obstructive CAD, intact globle LV size and systolic function with regional wall motion abnormalities noted,  ?coronary spasm producing wall motion abnormality, elevated CPK and chest pain (ef 55%)  . CARDIAC PACEMAKER PLACEMENT  10-11-2006    dr Leonia Reeves   W/  ATRIAL LEAD REVISION 10-12-2006   (medtronic)  . CARDIOVASCULAR STRESS TEST  11-18-2011    dr Irish Lack   Low risk nuclear study w/ no ishemia/  normal wall motion,  post-stress ef 48% (LVEF 56% on prior study 2008)  . CARPAL TUNNEL RELEASE Left 11/14/2014   Procedure: CARPAL TUNNEL RELEASE LEFT THUMB;  Surgeon: Daryll Brod, MD;  Location: [place];  Service: Orthopedics;  Laterality: Left;  ANESTHESIA:  IV REGIONAL FAB  . CATARACT EXTRACTION W/ INTRAOCULAR LENS  IMPLANT, BILATERAL    . CHOLECYSTECTOMY  10/23/2011  . CHOLECYSTECTOMY  10/23/2011   Procedure: CHOLECYSTECTOMY;  Surgeon: Rolm Bookbinder, MD;  Location: [place];  Service: General;  Laterality: N/A;  with intraoperative cholangiogram  . COLONOSCOPY    . ESOPHAGOGASTRODUODENOSCOPY (EGD) WITH PROPOFOL N/A 01/14/2015   Procedure: ESOPHAGOGASTRODUODENOSCOPY (EGD) WITH PROPOFOL;  Surgeon: Garlan Fair, MD;  Location: [facility];  Service: Endoscopy;  Laterality: N/A;  . I&D EXTREMITY Left 10/20/2012   Procedure: LEFT LEG IRRIGATION AND DEBRIDEMENT, AND WOUND VAC APPLICATION;  Surgeon: Marianna Payment, MD;   Location: [facility];  Service: Orthopedics;  Laterality: Left;  . I&D EXTREMITY Left 10/22/2012   Procedure: IRRIGATION AND DEBRIDEMENT EXTREMITY;  Surgeon: Marianna Payment, MD;  Location: [facility];  Service: Orthopedics;  Laterality: Left;  . INGUINAL HERNIA REPAIR Left yrs ago  . LAPAROSCOPIC ASSISTED VENTRAL HERNIA REPAIR  08-08-2009   dr Excell Seltzer   AND OPEN REPAIR RECURRENT LEFT INGUINAL HERNIA  . LAPAROSCOPIC INGUINAL HERNIA REPAIR Left 07-24-1999    dr Excell Seltzer  . OPEN VENTRAL HERNIA REPAIR /  LYSIS ADHESIONS  09-23-2010    dr Donne Hazel  . ORIF ELBOW FRACTURE Left 03/29/2015   Procedure: LEFT ELBOW OPEN REDUCTION INTERNAL FIXATION (ORIF) DISTAL HUMERUS FRACTURE WITH OLECRANON OSTEOTOMY AND ULNAR NERVE RELEASE;  Surgeon: Roseanne Kaufman, MD;  Location: [place];  Service: Orthopedics;  Laterality: Left;  . REPAIR SUPRAUMBILICAL HERNIA   [phone]   dr Excell Seltzer  . SHOULDER ARTHROSCOPY WITH DISTAL CLAVICLE RESECTION Right 11-07-2007   dr Rhona Raider   [place] A[facility]OMI[facility]  . SKIN SPLIT GRAFT Left 10/22/2012   Procedure: SKIN GRAFT SPLIT THICKNESS;  Surgeon: Marianna Payment, MD;  Location: [facility];  Service: Orthopedics;  Laterality: Left;  . TOTAL KNEE ARTHROPLASTY Right 1990's  . TRANSTHORACIC ECHOCARDIOGRAM  06-12-2012   dr Irish Lack   ef 50-55%/ mild LAE/ mild MR and TR/  AV sclerosis without stenosis/  RVSP 36mmHg  . TRANSURETHRAL RESECTION OF BLADDER TUMOR N/A 07/15/2017   Procedure: CYSTOSCOPY TRANSURETHRAL RESECTION OF BLADDER TUMOR (TURBT);  Surgeon: Kathie Rhodes, MD;  Location: [facility];  Service: Urology;  Laterality: N/A;    Prior to Admission medications   Medication Sig Start Date End Date Taking? Authorizing Provider  acetaminophen (TYLENOL) 650 MG CR tablet Take 1,300 mg by mouth 2 (two) times daily.    Yes [provider]  albuterol (PROVENTIL HFA;VENTOLIN HFA) 108 (90 Base) MCG/ACT inhaler Inhale 2 puffs into the lungs every 6 (six)  hours as needed for wheezing or shortness of breath. 09/19/15  Yes Rigoberto Noel, MD  aspirin EC 81 MG tablet Take 81 mg by mouth at bedtime.    Yes [provider]  Calcium Carbonate-Vitamin D (CALCIUM-D PO) Take 1 tablet by mouth every evening.    Yes [provider]  docusate sodium (COLACE) 100 MG capsule Take 100 mg by mouth daily.   Yes [provider]  furosemide (LASIX) 20 MG tablet Take 20-40 mg by mouth See admin instructions. Take 20mg  daily.  May take an additional 20mg  for leg swelling   Yes [provider]  lisinopril (PRINIVIL,ZESTRIL) 10 MG tablet Take 10 mg by mouth every evening.   Yes [provider]  Multiple Vitamins-Minerals (PRESERVISION AREDS PO) Take 1 tablet by mouth 2 (two) times daily.   Yes [provider]  omeprazole (PRILOSEC) 20 MG capsule Take 20 mg by mouth every morning.  Yes [provider]  oxybutynin (DITROPAN XL) 15 MG 24 hr tablet Take 15 mg by mouth once a week.    Yes [provider]  oxyCODONE (OXY IR/ROXICODONE) 5 MG immediate release tablet Take 5 mg by mouth every 6 (six) hours as needed for severe pain.   Yes [provider]  predniSONE (DELTASONE) 10 MG tablet Take 1 tablet (10 mg total) by mouth daily with breakfast. 06/30/17  Yes Newt Minion, MD  Umeclidinium-Vilanterol (ANORO ELLIPTA) 62.5-25 MCG/INH AEPB Inhale 1 puff into the lungs at bedtime.    Yes [provider]  vitamin B-12 (CYANOCOBALAMIN) 500 MCG tablet Take 500 mcg by mouth daily.    Yes [provider]    Scheduled Meds: Continuous Infusions: . lactated ringers 20 mL/hr at 10/12/17 0720   PRN Meds:.  Allergies as of 08/01/2017  . (No Known Allergies)    Family History  Problem Relation Age of Onset  . Heart disease Father   . Heart disease Mother   . Colon cancer Mother   . Stroke Mother   . Heart attack Neg Hx   . Hypertension Neg Hx     Social History    Socioeconomic History  . Marital status: Married    Spouse name: martha  . Number of children: 3  . Years of education: 35  . Highest education level: Not on file  Occupational History  . 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 Financial resource strain: Not on file  . Food insecurity:    Worry: Not on file    Inability: Not on file  . Transportation needs:    Medical: Not on file    Non-medical: Not on file  Tobacco Use  . Smoking status: Former Smoker    Packs/day: 1.50    Years: 35.00    Pack years: 52.50    Types: Cigarettes    Last attempt to quit: 02/15/1973    Years since quitting: 44.6  . Smokeless tobacco: Never Used  Substance and Sexual Activity  . Alcohol use: No  . Drug use: No  . Sexual activity: Never  Lifestyle  . Physical activity:    Days per week: Not on file    Minutes per session: Not on file  . Stress: Not on file  Relationships  . Social connections:    Talks on phone: Not on file    Gets together: Not on file    Attends religious service: Not on file    Active member of club or organization: Not on file    Attends meetings of clubs or organizations: Not on file    Relationship status: Not on file  . Intimate partner violence:    Fear of current or ex partner: Not on file    Emotionally abused: Not on file    Physically abused: Not on file    Forced sexual activity: Not on file  Other Topics Concern  . Not on file  Social History Narrative   Lives at home w/ his wife, [name]   Right-handed   Drinks about 1 soda per day, decaf coffee    Review of Systems: All negative except as stated above in HPI.  Physical Exam: Vital signs: Vitals:   10/12/17 0716  BP: (!) 180/79  Pulse: 76  Resp: 18  Temp: 97.8 F (36.6 C)  SpO2: 100%     General:   Alert,  Well-developed, well-nourished, pleasant and cooperative in NAD Lungs:  Clear throughout to auscultation.   No wheezes,  crackles, or rhonchi. No acute distress. Heart:  Regular rate and rhythm; no murmurs, clicks, rubs,  or gallops. Abdomen: soft, nontender, nondistended, bowel sounds present Rectal:  Deferred  GI:  Lab Results: No results for input(s): WBC, HGB, HCT, PLT in the last 72 hours. BMET No results for input(s): NA, K, CL, CO2, GLUCOSE, BUN, CREATININE, CALCIUM in the last 72 hours. LFT No results for input(s): PROT, ALBUMIN, AST, ALT, ALKPHOS, BILITOT, BILIDIR, IBILI in the last 72 hours. PT/INR No results for input(s): LABPROT, INR in the last 72 hours.   Studies/Results: No results found.  Impression/Plan: Esophageal dysphagia  lower esophageal ring  Recommendations ------------------------ - EGD with possible dilatation today.  Risks (bleeding, infection, bowel perforation that could require surgery, sedation-related changes in cardiopulmonary systems), benefits (identification and possible treatment of source of symptoms, exclusion of certain causes of symptoms), and alternatives (watchful waiting, radiographic imaging studies, empiric medical treatment)  were explained to patient and family in detail and patient wishes to proceed.    LOS: 0 days   Otis Brace  MD, FACP 10/12/2017, 7:54 AM  Contact #  [phone]

## 2017-10-12 NOTE — Anesthesia Procedure Notes (Signed)
Procedure Name: MAC Date/Time: 10/12/2017 8:00 AM Performed by: Mariea Clonts, CRNA Pre-anesthesia Checklist: Patient identified, Emergency Drugs available, Suction available, Patient being monitored and Timeout performed Patient Re-evaluated:Patient Re-evaluated prior to induction Oxygen Delivery Method: Nasal cannula Induction Type: IV induction Dental Injury: Teeth and Oropharynx as per pre-operative assessment

## 2017-10-12 NOTE — Op Note (Signed)
[facility] Patient Name: [name] [name] Procedure Date : 10/12/2017 MRN: [id number] Attending MD: Otis Brace , MD Date of Birth: [birth year] CSN: [id number] Age: 84 Admit Type: Outpatient Procedure:                Upper GI endoscopy Indications:              Esophageal dysphagia, Stenosis of the esophagus Providers:                Otis Brace, MD, Zenon Mayo, RN, Charolette Child, Technician Referring MD:              Medicines:                Sedation Administered by an Anesthesia Professional Complications:            No immediate complications. Estimated Blood Loss:     Estimated blood loss was minimal. Procedure:                Pre-Anesthesia Assessment:                           - Prior to the procedure, a History and Physical                            was performed, and patient medications and                            allergies were reviewed. The patient's tolerance of                            previous anesthesia was also reviewed. The risks                            and benefits of the procedure and the sedation                            options and risks were discussed with the patient.                            All questions were answered, and informed consent                            was obtained. Prior Anticoagulants: The patient has                            taken aspirin. ASA Grade Assessment: III - A                            patient with severe systemic disease. After                            reviewing the risks and benefits, the patient was  deemed in satisfactory condition to undergo the                            procedure.                           After obtaining informed consent, the endoscope was                            passed under direct vision. Throughout the                            procedure, the patient's blood pressure, pulse, and   oxygen saturations were monitored continuously. The                            [id number]-H190 ([id number]) Olympus Adult EGD was introduced                            through the mouth, and advanced to the second part                            of duodenum. The upper GI endoscopy was                            accomplished without difficulty. The patient                            tolerated the procedure well. Scope In: Scope Out: Findings:      Esophagogastric landmarks were identified: the gastroesophageal junction       was found at 36 cm from the incisors.      Non-severe esophagitis with no bleeding was found in the distal       esophagus.      A 7 cm hiatal hernia was present.      A non-obstructing Schatzki ring was found at the gastroesophageal       junction. A TTS dilator was passed through the scope. Dilation with a       12-13.5-15 mm balloon dilator was performed to 15 mm. The dilation site       was examined following endoscope reinsertion and showed no bleeding,       mucosal tear or perforation.      Scattered mild inflammation characterized by congestion (edema),       erythema and friability was found in the entire examined stomach.       Biopsies were taken with a cold forceps for histology.      The cardia and gastric fundus were normal on retroflexion.      The duodenal bulb, first portion of the duodenum and second portion of       the duodenum were normal. Impression:               - Esophagogastric landmarks identified.                           - Non-severe reflux esophagitis.                           -  7 cm hiatal hernia.                           - Non-obstructing Schatzki ring. Dilated.                           - Gastritis. Biopsied.                           - Normal duodenal bulb, first portion of the                            duodenum and second portion of the duodenum. Recommendation:           - Patient has a contact number available for                             emergencies. The signs and symptoms of potential                            delayed complications were discussed with the                            patient. Return to normal activities tomorrow.                            Written discharge instructions were provided to the                            patient.                           - Resume previous diet.                           - Continue present medications.                           - Await pathology results.                           - Repeat upper endoscopy PRN for retreatment.                           - Use Protonix (pantoprazole) 40 mg PO daily. Procedure Code(s):        --- Professional ---                           [phone], Esophagogastroduodenoscopy, flexible,                            transoral; with transendoscopic balloon dilation of                            esophagus (less than 30 mm diameter)  [id number], Esophagogastroduodenoscopy, flexible,                            transoral; with biopsy, single or multiple Diagnosis Code(s):        --- Professional ---                           K21.0, Gastro-esophageal reflux disease with                            esophagitis                           K44.9, Diaphragmatic hernia without obstruction or                            gangrene                           K22.2, Esophageal obstruction                           K29.70, Gastritis, unspecified, without bleeding                           R13.14, Dysphagia, pharyngoesophageal phase CPT copyright 2017 American Medical Association. All rights reserved. The codes documented in this report are preliminary and upon coder review may  be revised to meet current compliance requirements. Otis Brace, MD Otis Brace, MD 10/12/2017 8:28:11 AM Number of Addenda: 0

## 2017-10-12 NOTE — Transfer of Care (Signed)
Immediate Anesthesia Transfer of Care Note  Patient: [name] [name]  Procedure(s) Performed: ESOPHAGOGASTRODUODENOSCOPY (EGD) WITH PROPOFOL (N/A ) BALLOON DILATION (N/A ) BIOPSY  Patient Location: Endoscopy Unit  Anesthesia Type:MAC  Level of Consciousness: awake, alert  and oriented  Airway & Oxygen Therapy: Patient Spontanous Breathing and Patient connected to nasal cannula oxygen  Post-op Assessment: Report given to RN, Post -op Vital signs reviewed and stable and Patient moving all extremities X 4  Post vital signs: Reviewed and stable  Last Vitals:  Vitals Value Taken Time  BP 116/57 10/12/2017  8:26 AM  Temp 36.5 C 10/12/2017  8:26 AM  Pulse 63 10/12/2017  8:27 AM  Resp 14 10/12/2017  8:27 AM  SpO2 98 % 10/12/2017  8:27 AM  Vitals shown include unvalidated device data.  Last Pain:  Vitals:   10/12/17 0826  TempSrc: Oral  PainSc: 0-No pain         Complications: No apparent anesthesia complications

## 2017-10-12 NOTE — Anesthesia Postprocedure Evaluation (Signed)
Anesthesia Post Note  Patient: [name] [name]  Procedure(s) Performed: ESOPHAGOGASTRODUODENOSCOPY (EGD) WITH PROPOFOL (N/A ) BALLOON DILATION (N/A ) BIOPSY     Patient location during evaluation: Endoscopy Anesthesia Type: MAC Level of consciousness: awake and alert Pain management: pain level controlled Vital Signs Assessment: post-procedure vital signs reviewed and stable Respiratory status: spontaneous breathing, nonlabored ventilation, respiratory function stable and patient connected to nasal cannula oxygen Cardiovascular status: stable and blood pressure returned to baseline Postop Assessment: no apparent nausea or vomiting Anesthetic complications: no    Last Vitals:  Vitals:   10/12/17 0716 10/12/17 0826  BP: (!) 180/79 (!) 116/57  Pulse: 76   Resp: 18 14  Temp: 36.6 C 36.5 C  SpO2: 100% 98%    Last Pain:  Vitals:   10/12/17 0826  TempSrc: Oral  PainSc: 0-No pain                 Lenea Bywater

## 2017-10-13 ENCOUNTER — Encounter (HOSPITAL_COMMUNITY): Payer: Self-pay | Admitting: Gastroenterology

## 2017-10-18 DIAGNOSIS — Z8551 Personal history of malignant neoplasm of bladder: Secondary | ICD-10-CM | POA: Diagnosis not present

## 2017-10-31 DIAGNOSIS — I10 Essential (primary) hypertension: Secondary | ICD-10-CM | POA: Diagnosis not present

## 2017-10-31 DIAGNOSIS — M150 Primary generalized (osteo)arthritis: Secondary | ICD-10-CM | POA: Diagnosis not present

## 2017-10-31 DIAGNOSIS — R2689 Other abnormalities of gait and mobility: Secondary | ICD-10-CM | POA: Diagnosis not present

## 2017-10-31 DIAGNOSIS — D649 Anemia, unspecified: Secondary | ICD-10-CM | POA: Diagnosis not present

## 2017-10-31 DIAGNOSIS — G629 Polyneuropathy, unspecified: Secondary | ICD-10-CM | POA: Diagnosis not present

## 2017-10-31 DIAGNOSIS — K432 Incisional hernia without obstruction or gangrene: Secondary | ICD-10-CM | POA: Diagnosis not present

## 2017-10-31 DIAGNOSIS — Z5181 Encounter for therapeutic drug level monitoring: Secondary | ICD-10-CM | POA: Diagnosis not present

## 2017-10-31 DIAGNOSIS — R600 Localized edema: Secondary | ICD-10-CM | POA: Diagnosis not present

## 2017-10-31 DIAGNOSIS — I872 Venous insufficiency (chronic) (peripheral): Secondary | ICD-10-CM | POA: Diagnosis not present

## 2017-11-01 DIAGNOSIS — M810 Age-related osteoporosis without current pathological fracture: Secondary | ICD-10-CM | POA: Diagnosis not present

## 2017-11-01 DIAGNOSIS — Z23 Encounter for immunization: Secondary | ICD-10-CM | POA: Diagnosis not present

## 2017-11-07 DIAGNOSIS — M48062 Spinal stenosis, lumbar region with neurogenic claudication: Secondary | ICD-10-CM | POA: Diagnosis not present

## 2017-11-07 DIAGNOSIS — Z79891 Long term (current) use of opiate analgesic: Secondary | ICD-10-CM | POA: Diagnosis not present

## 2017-11-07 DIAGNOSIS — G894 Chronic pain syndrome: Secondary | ICD-10-CM | POA: Diagnosis not present

## 2017-11-07 DIAGNOSIS — M546 Pain in thoracic spine: Secondary | ICD-10-CM | POA: Diagnosis not present

## 2017-11-15 ENCOUNTER — Other Ambulatory Visit (INDEPENDENT_AMBULATORY_CARE_PROVIDER_SITE_OTHER): Payer: Self-pay | Admitting: Orthopedic Surgery

## 2017-11-15 NOTE — Telephone Encounter (Signed)
Do you want to refill? 

## 2017-11-17 DIAGNOSIS — H353122 Nonexudative age-related macular degeneration, left eye, intermediate dry stage: Secondary | ICD-10-CM | POA: Diagnosis not present

## 2017-11-17 DIAGNOSIS — H353211 Exudative age-related macular degeneration, right eye, with active choroidal neovascularization: Secondary | ICD-10-CM | POA: Diagnosis not present

## 2017-11-17 DIAGNOSIS — H35373 Puckering of macula, bilateral: Secondary | ICD-10-CM | POA: Diagnosis not present

## 2017-11-22 DIAGNOSIS — R600 Localized edema: Secondary | ICD-10-CM | POA: Diagnosis not present

## 2017-11-28 ENCOUNTER — Ambulatory Visit (INDEPENDENT_AMBULATORY_CARE_PROVIDER_SITE_OTHER): Payer: Medicare Other | Admitting: *Deleted

## 2017-11-28 ENCOUNTER — Telehealth: Payer: Self-pay | Admitting: Cardiology

## 2017-11-28 DIAGNOSIS — R600 Localized edema: Secondary | ICD-10-CM | POA: Diagnosis not present

## 2017-11-28 DIAGNOSIS — I495 Sick sinus syndrome: Secondary | ICD-10-CM | POA: Diagnosis not present

## 2017-11-28 NOTE — Telephone Encounter (Signed)
Spoke with pt and reminded pt of remote transmission that is due today. Pt verbalized understanding.   

## 2017-11-29 DIAGNOSIS — L820 Inflamed seborrheic keratosis: Secondary | ICD-10-CM | POA: Diagnosis not present

## 2017-11-29 NOTE — Progress Notes (Signed)
Remote pacemaker transmission.   

## 2017-12-01 ENCOUNTER — Encounter: Payer: Self-pay | Admitting: Cardiology

## 2017-12-01 ENCOUNTER — Encounter (INDEPENDENT_AMBULATORY_CARE_PROVIDER_SITE_OTHER): Payer: Self-pay | Admitting: Orthopedic Surgery

## 2017-12-01 ENCOUNTER — Ambulatory Visit (INDEPENDENT_AMBULATORY_CARE_PROVIDER_SITE_OTHER): Payer: Medicare Other | Admitting: Physician Assistant

## 2017-12-01 ENCOUNTER — Ambulatory Visit (INDEPENDENT_AMBULATORY_CARE_PROVIDER_SITE_OTHER): Payer: Medicare Other

## 2017-12-01 VITALS — Ht 70.0 in | Wt 175.0 lb

## 2017-12-01 DIAGNOSIS — M7712 Lateral epicondylitis, left elbow: Secondary | ICD-10-CM

## 2017-12-01 DIAGNOSIS — M6281 Muscle weakness (generalized): Secondary | ICD-10-CM | POA: Diagnosis not present

## 2017-12-01 DIAGNOSIS — M25522 Pain in left elbow: Secondary | ICD-10-CM

## 2017-12-01 DIAGNOSIS — S52022S Displaced fracture of olecranon process without intraarticular extension of left ulna, sequela: Secondary | ICD-10-CM | POA: Diagnosis not present

## 2017-12-04 ENCOUNTER — Encounter (INDEPENDENT_AMBULATORY_CARE_PROVIDER_SITE_OTHER): Payer: Self-pay | Admitting: Physician Assistant

## 2017-12-04 NOTE — Progress Notes (Signed)
Office Visit Note   Patient: [name] [name]           Date of Birth: 11-05-1924           MRN: [id number] Visit Date: 12/01/2017              Requested by: [name], MD [place], [place] [address] PCP: [name], MD  Chief Complaint  Patient presents with  . Left Arm - Pain      HPI: Patient is a 84 year old male who presents with complaints of left arm pain.  He reports that the pain is been moderate to more severe at times and is about his left lateral elbow.  He reports that the pain tends to radiate up and down the arm particularly about the forearm and he feels like there is muscle strain when he tries to do too much with the forearm and has pain with supination and pronation.  He is also concerned that he is generally getting weak and feels like he needs some strengthening as he does not feel as stable as he has been in the past.  He does ambulate with a rolling walker.  Assessment & Plan: Visit Diagnoses:  1. Lateral epicondylitis, left elbow   2. Pain in left elbow   3. Closed fracture of olecranon process of left ulna, sequela   4. Generalized muscle weakness     Plan: Counseled the patient that his radiographs of his left elbow show his previous well-healed fracture with hardware in place without evidence of any loosening or check other changes.  Provided a silver compression sleeve for the patient's left arm for his muscular strain/lateral epicondylitis symptoms.  Also will order some physical therapy for generalized strengthening and balance and endurance given the patient's recent general decline with strength and gait.  He will follow-up here on an as-needed basis.  Follow-Up Instructions: Return if symptoms worsen or fail to improve.   Ortho Exam  Patient is alert, oriented, no adenopathy, well-dressed, normal affect, normal respiratory effort. Patient ambulates with a rolling walker.  He does have some mild balance difficulties with  sitting to standing and standing to sitting.  The left elbow shows tenderness to palpation over the lateral epicondyle and pain with supination pronation.  He also has some pain over the distal biceps to palpation.  There is no signs of cellulitis.  No signs of acute trauma.  Imaging: No results found. No images are attached to the encounter.  Labs: Lab Results  Component Value Date   CRP 16.2 (H) 10/20/2011   LABURIC 4.7 10/17/2006   REPTSTATUS 03/31/2015 FINAL 03/30/2015   GRAMSTAIN Abundant 12/18/2015   GRAMSTAIN WBC present-predominately PMN 12/18/2015   GRAMSTAIN No Squamous Epithelial Cells Seen 12/18/2015   GRAMSTAIN No Organisms Seen 12/18/2015   CULT 9,000 COLONIES/mL INSIGNIFICANT GROWTH 03/30/2015   LABORGA NORMAL SKIN FLORA 12/18/2015     Lab Results  Component Value Date   ALBUMIN 4.1 05/18/2016   ALBUMIN 3.2 (L) 10/17/2012   ALBUMIN 1.9 (L) 10/24/2011   LABURIC 4.7 10/17/2006    Body mass index is 25.11 kg/m.  Orders:  Orders Placed This Encounter  Procedures  . XR Elbow 2 Views Left   No orders of the defined types were placed in this encounter.    Procedures: No procedures performed  Clinical Data: No additional findings.  ROS:  All other systems negative, except as noted in the HPI. Review of Systems  Objective:  Vital Signs: Ht 5\' 10"  (1.778 m)   Wt 175 lb (79.4 kg)   BMI 25.11 kg/m   Specialty Comments:  No specialty comments available.  PMFS History: Patient Active Problem List   Diagnosis Date Noted  . PCO (posterior capsular opacification), bilateral 05/26/2017  . Weakness of right leg 04/20/2017  . 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 Exudative age-related macular degeneration of right eye with active choroidal neovascularization ([place]) 09/30/2016  . Pseudophakia of both eyes 09/30/2016  . Impingement syndrome of right shoulder 06/16/2016  . Impingement syndrome of left shoulder  06/16/2016  . Bilateral leg edema 06/14/2016  . Dizziness and giddiness 06/26/2015  . Abnormality of gait 06/26/2015  . Anxiety 04/14/2015  . GERD (gastroesophageal reflux disease) 04/03/2015  . OSA (obstructive sleep apnea) 04/03/2015  . HLD (hyperlipidemia) 04/03/2015  . SOB (shortness of breath)   . Left elbow fracture 03/29/2015  . Dizziness 01/01/2015  . LBP (low back pain) 01/01/2015  . Malignant neoplasm of prostate ([place]) 01/01/2015  . BP (high blood pressure) 01/01/2015  . DOE (dyspnea on exertion) 10/26/2013  . Essential hypertension, benign 10/26/2013  . Post-traumatic wound infection 10/17/2012  . Cellulitis 10/17/2012  . Atrial fibrillation ([place]) 10/22/2011  . DVT, lower extremity, distal, chronic ([place]) 10/22/2011  . Pacemaker 10/19/2011  . Prostate cancer ([place]) 10/19/2011  . COPD (chronic obstructive pulmonary disease) with emphysema ([place]) 02/04/2011  . Anemia 12/10/2010   Past Medical History:  Diagnosis Date  . Abnormality of gait    due to low back pain , uses  [name] and walker  . Arthritis   . Bilateral lower extremity edema    wears TED hose  . Cancer [facility])    prostate- treated with radiation  . Cardiac pacemaker in situ 10/11/2006   medtronic  . Chronic iron deficiency anemia oncologist/ hematoloist-- dr Alen Blew   treatment IV Iron infusions  . Chronic low back pain   . CKD (chronic kidney disease), stage III ([place])   . COPD with emphysema [facility])    pulmologist-  dr Elsworth Soho  . DDD (degenerative disc disease), lumbosacral   . Degenerative scoliosis   . Dyspnea    with exertion  . Full dentures   . GERD (gastroesophageal reflux disease)   . Gross hematuria   . Headache    history of migraines- "way in the past"  . Hereditary and idiopathic peripheral neuropathy    bilateral lower leg  . Hiatal hernia   . History of DVT of lower extremity 10/19/2011   chronic distal popliteal right lower extremity  . History of external beam radiation therapy 2008    prostate cancer  . History of pancreatitis 2013  . History of pneumothorax 09/2006   iatrogenic-- in setting cardiac pacemaker placement  . History of prostate cancer 2008   s/p  external radiation therapy  . Hypertension   . Lesion of bladder   . Macular degeneration of right eye   . Nocturia   . OSA on CPAP   . PAF (paroxysmal atrial fibrillation) ([place]) 10/2011   1st episode documented 09/ 2013 admission for gallstons/ pancreatitis and prior to cholecystectomy , went back in NSR without interventeion  . Presence of permanent cardiac pacemaker   . Sinus node dysfunction ([facility])    s/p  cardiac pacemaker placement 10-11-2006  . Wears glasses   . Wears hearing aid in both ears     Family History  Problem Relation Age of  Onset  . Heart disease Father   . Heart disease Mother   . Colon cancer Mother   . Stroke Mother   . Heart attack Neg Hx   . Hypertension Neg Hx     Past Surgical History:  Procedure Laterality Date  . BALLOON DILATION N/A 01/14/2015   Procedure: BALLOON DILATION;  Surgeon: Garlan Fair, MD;  Location: [facility];  Service: Endoscopy;  Laterality: N/A;  . BALLOON DILATION N/A 10/12/2017   Procedure: BALLOON DILATION;  Surgeon: Otis Brace, MD;  Location: [facility];  Service: Gastroenterology;  Laterality: N/A;  . BIOPSY  10/12/2017   Procedure: BIOPSY;  Surgeon: Otis Brace, MD;  Location: [facility];  Service: Gastroenterology;;  . CARDIAC CATHETERIZATION  08-29-2000   [place]   no significant obstructive CAD, intact globle LV size and systolic function with regional wall motion abnormalities noted,  ?coronary spasm producing wall motion abnormality, elevated CPK and chest pain (ef 55%)  . CARDIAC PACEMAKER PLACEMENT  10-11-2006    dr Leonia Reeves   W/  ATRIAL LEAD REVISION 10-12-2006   (medtronic)  . CARDIOVASCULAR STRESS TEST  11-18-2011    dr Irish Lack   Low risk nuclear study w/ no ishemia/  normal wall motion,  post-stress ef 48% (LVEF 56% on  prior study 2008)  . CARPAL TUNNEL RELEASE Left 11/14/2014   Procedure: CARPAL TUNNEL RELEASE LEFT THUMB;  Surgeon: Daryll Brod, MD;  Location: [facility];  Service: Orthopedics;  Laterality: Left;  ANESTHESIA:  IV REGIONAL FAB  . CATARACT EXTRACTION W/ INTRAOCULAR LENS  IMPLANT, BILATERAL    . CHOLECYSTECTOMY  10/23/2011  . CHOLECYSTECTOMY  10/23/2011   Procedure: CHOLECYSTECTOMY;  Surgeon: Rolm Bookbinder, MD;  Location: [facility];  Service: General;  Laterality: N/A;  with intraoperative cholangiogram  . COLONOSCOPY    . ESOPHAGOGASTRODUODENOSCOPY (EGD) WITH PROPOFOL N/A 01/14/2015   Procedure: ESOPHAGOGASTRODUODENOSCOPY (EGD) WITH PROPOFOL;  Surgeon: Garlan Fair, MD;  Location: [facility];  Service: Endoscopy;  Laterality: N/A;  . ESOPHAGOGASTRODUODENOSCOPY (EGD) WITH PROPOFOL N/A 10/12/2017   Procedure: ESOPHAGOGASTRODUODENOSCOPY (EGD) WITH PROPOFOL;  Surgeon: Otis Brace, MD;  Location: [facility];  Service: Gastroenterology;  Laterality: N/A;  . I&D EXTREMITY Left 10/20/2012   Procedure: LEFT LEG IRRIGATION AND DEBRIDEMENT, AND WOUND VAC APPLICATION;  Surgeon: Marianna Payment, MD;  Location: [facility];  Service: Orthopedics;  Laterality: Left;  . I&D EXTREMITY Left 10/22/2012   Procedure: IRRIGATION AND DEBRIDEMENT EXTREMITY;  Surgeon: Marianna Payment, MD;  Location: [facility];  Service: Orthopedics;  Laterality: Left;  . INGUINAL HERNIA REPAIR Left yrs ago  . LAPAROSCOPIC ASSISTED VENTRAL HERNIA REPAIR  08-08-2009   dr Excell Seltzer   AND OPEN REPAIR RECURRENT LEFT INGUINAL HERNIA  . LAPAROSCOPIC INGUINAL HERNIA REPAIR Left 07-24-1999    dr Excell Seltzer  . OPEN VENTRAL HERNIA REPAIR /  LYSIS ADHESIONS  09-23-2010    dr Donne Hazel  . ORIF ELBOW FRACTURE Left 03/29/2015   Procedure: LEFT ELBOW OPEN REDUCTION INTERNAL FIXATION (ORIF) DISTAL HUMERUS FRACTURE WITH OLECRANON OSTEOTOMY AND ULNAR NERVE RELEASE;  Surgeon: Roseanne Kaufman, MD;  Location: [facility];  Service: Orthopedics;   Laterality: Left;  . REPAIR SUPRAUMBILICAL HERNIA   [phone]   dr Excell Seltzer  . SHOULDER ARTHROSCOPY WITH DISTAL CLAVICLE RESECTION Right 11-07-2007   dr Rhona Raider   [place] ACROMIOPLASTY  . SKIN SPLIT GRAFT Left 10/22/2012   Procedure: SKIN GRAFT SPLIT THICKNESS;  Surgeon: Marianna Payment, MD;  Location: [facility];  Service: Orthopedics;  Laterality: Left;  . TOTAL KNEE ARTHROPLASTY Right 1990's  . TRANSTHORACIC ECHOCARDIOGRAM  06-12-2012   dr Irish Lack   ef 50-55%/ mild LAE/ mild MR and TR/  AV sclerosis without stenosis/  RVSP 35mmHg  . TRANSURETHRAL RESECTION OF BLADDER TUMOR N/A 07/15/2017   Procedure: CYSTOSCOPY TRANSURETHRAL RESECTION OF BLADDER TUMOR (TURBT);  Surgeon: Kathie Rhodes, MD;  Location: [facility];  Service: Urology;  Laterality: N/A;   Social History   Occupational History  . 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 Smoking status: Former Smoker    Packs/day: 1.50    Years: 35.00    Pack years: 52.50    Types: Cigarettes    Last attempt to quit: 02/15/1973    Years since quitting: 44.8  . Smokeless tobacco: Never Used  Substance and Sexual Activity  . Alcohol use: No  . Drug use: No  . Sexual activity: Never

## 2017-12-14 ENCOUNTER — Encounter: Payer: Self-pay | Admitting: Neurology

## 2017-12-16 ENCOUNTER — Encounter (HOSPITAL_COMMUNITY): Payer: Self-pay | Admitting: Emergency Medicine

## 2017-12-16 ENCOUNTER — Emergency Department (HOSPITAL_COMMUNITY): Payer: Medicare Other

## 2017-12-16 ENCOUNTER — Emergency Department (HOSPITAL_COMMUNITY)
Admission: EM | Admit: 2017-12-16 | Discharge: 2017-12-16 | Disposition: A | Discharge status: Home / Self Care | Payer: Medicare Other | Attending: Emergency Medicine | Admitting: Emergency Medicine

## 2017-12-16 ENCOUNTER — Other Ambulatory Visit: Payer: Self-pay

## 2017-12-16 DIAGNOSIS — Y9389 Activity, other specified: Secondary | ICD-10-CM | POA: Diagnosis not present

## 2017-12-16 DIAGNOSIS — S199XXA Unspecified injury of neck, initial encounter: Secondary | ICD-10-CM | POA: Diagnosis not present

## 2017-12-16 DIAGNOSIS — S0003XA Contusion of scalp, initial encounter: Secondary | ICD-10-CM | POA: Diagnosis not present

## 2017-12-16 DIAGNOSIS — Z86718 Personal history of other venous thrombosis and embolism: Secondary | ICD-10-CM | POA: Diagnosis not present

## 2017-12-16 DIAGNOSIS — W01198A Fall on same level from slipping, tripping and stumbling with subsequent striking against other object, initial encounter: Secondary | ICD-10-CM | POA: Insufficient documentation

## 2017-12-16 DIAGNOSIS — Y999 Unspecified external cause status: Secondary | ICD-10-CM | POA: Diagnosis not present

## 2017-12-16 DIAGNOSIS — Z7982 Long term (current) use of aspirin: Secondary | ICD-10-CM | POA: Insufficient documentation

## 2017-12-16 DIAGNOSIS — Z79899 Other long term (current) drug therapy: Secondary | ICD-10-CM | POA: Diagnosis not present

## 2017-12-16 DIAGNOSIS — I129 Hypertensive chronic kidney disease with stage 1 through stage 4 chronic kidney disease, or unspecified chronic kidney disease: Secondary | ICD-10-CM | POA: Diagnosis not present

## 2017-12-16 DIAGNOSIS — N183 Chronic kidney disease, stage 3 (moderate): Secondary | ICD-10-CM | POA: Insufficient documentation

## 2017-12-16 DIAGNOSIS — Z0289 Encounter for other administrative examinations: Secondary | ICD-10-CM | POA: Diagnosis not present

## 2017-12-16 DIAGNOSIS — W19XXXA Unspecified fall, initial encounter: Secondary | ICD-10-CM

## 2017-12-16 DIAGNOSIS — S51011A Laceration without foreign body of right elbow, initial encounter: Secondary | ICD-10-CM | POA: Diagnosis not present

## 2017-12-16 DIAGNOSIS — J449 Chronic obstructive pulmonary disease, unspecified: Secondary | ICD-10-CM | POA: Insufficient documentation

## 2017-12-16 DIAGNOSIS — Y92513 Shop (commercial) as the place of occurrence of the external cause: Secondary | ICD-10-CM | POA: Diagnosis not present

## 2017-12-16 DIAGNOSIS — S0990XA Unspecified injury of head, initial encounter: Secondary | ICD-10-CM | POA: Insufficient documentation

## 2017-12-16 DIAGNOSIS — R58 Hemorrhage, not elsewhere classified: Secondary | ICD-10-CM | POA: Diagnosis not present

## 2017-12-16 DIAGNOSIS — Z87891 Personal history of nicotine dependence: Secondary | ICD-10-CM | POA: Diagnosis not present

## 2017-12-16 DIAGNOSIS — S59901A Unspecified injury of right elbow, initial encounter: Secondary | ICD-10-CM | POA: Diagnosis not present

## 2017-12-16 DIAGNOSIS — Z8546 Personal history of malignant neoplasm of prostate: Secondary | ICD-10-CM | POA: Insufficient documentation

## 2017-12-16 DIAGNOSIS — M25521 Pain in right elbow: Secondary | ICD-10-CM | POA: Diagnosis not present

## 2017-12-16 MED ORDER — LIDOCAINE HCL 2 % IJ SOLN
20.0000 mL | Freq: Once | INTRAMUSCULAR | Status: DC
  Filled 2017-12-16: qty 20

## 2017-12-16 NOTE — ED Triage Notes (Signed)
Pt arrives via EMS from [place] s/p fall. Patient states he turned around too quickly and fell. Patient with a skin tear to right elbow dressed by EMS and a hematoma to the top of his head. Patient states he did hit the floor hard. No blood thinners.

## 2017-12-16 NOTE — ED Provider Notes (Signed)
[facility] DEPT Provider Note   CSN: [id number] Arrival date & time: 12/16/17  1908     History   Chief Complaint Chief Complaint  Patient presents with  . Fall    HPI [name] [name] is a 84 y.o. male.  HPI Patient presents to the emergency department with injuries following a fall.  The patient states that he was out shopping when he turned too quickly and slipped and fell.  The patient states he hit the back of his head.  He states he also has a area that is bleeding on his right elbow.  The patient states that he does not take any blood thinners.  Patient states that he did not lose consciousness.  Patient states he did not have any presyncopal symptoms prior to falling.  The patient denies chest pain, shortness of breath, headache,blurred vision, neck pain, fever, cough, weakness, numbness, dizziness, anorexia, edema, abdominal pain, nausea, vomiting, diarrhea, rash, back pain, dysuria, hematemesis, bloody stool, near syncope, or syncope. Past Medical History:  Diagnosis Date  . Abnormality of gait    due to low back pain , uses  cane and walker  . Arthritis   . Bilateral lower extremity edema    wears TED hose  . Cancer [facility])    prostate- treated with radiation  . Cardiac pacemaker in situ 10/11/2006   medtronic  . Chronic iron deficiency anemia oncologist/ hematoloist-- dr Alen Blew   treatment IV Iron infusions  . Chronic low back pain   . CKD (chronic kidney disease), stage III ([place])   . COPD with emphysema [facility])    pulmologist-  dr Elsworth Soho  . DDD (degenerative disc disease), lumbosacral   . Degenerative scoliosis   . Dyspnea    with exertion  . Full dentures   . GERD (gastroesophageal reflux disease)   . Gross hematuria   . Headache    history of migraines- "way in the past"  . Hereditary and idiopathic peripheral neuropathy    bilateral lower leg  . Hiatal hernia   . History of DVT of lower extremity 10/19/2011   chronic distal  popliteal right lower extremity  . History of external beam radiation therapy 2008   prostate cancer  . History of pancreatitis 2013  . History of pneumothorax 09/2006   iatrogenic-- in setting cardiac pacemaker placement  . History of prostate cancer 2008   s/p  external radiation therapy  . Hypertension   . Lesion of bladder   . Macular degeneration of right eye   . Nocturia   . OSA on CPAP   . PAF (paroxysmal atrial fibrillation) ([place]) 10/2011   1st episode documented 09/ 2013 admission for gallstons/ pancreatitis and prior to cholecystectomy , went back in NSR without interventeion  . Presence of permanent cardiac pacemaker   . Sinus node dysfunction (H[facility])    s/p  cardiac pacemaker placement 10-11-2006  . Wears glasses   . Wears hearing aid in both ears     Patient Active Problem List   Diagnosis Date Noted  . PCO (posterior capsular opacification), bilateral 05/26/2017  . Weakness of right leg 04/20/2017  . 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 Exudative age-related macular degeneration of right eye with active choroidal neovascularization ([place]) 09/30/2016  . Pseudophakia of both eyes 09/30/2016  . Impingement syndrome of right shoulder 06/16/2016  . Impingement syndrome of left shoulder 06/16/2016  . Bilateral leg edema 06/14/2016  . Dizziness  and giddiness 06/26/2015  . Abnormality of gait 06/26/2015  . Anxiety 04/14/2015  . GERD (gastroesophageal reflux disease) 04/03/2015  . OSA (obstructive sleep apnea) 04/03/2015  . HLD (hyperlipidemia) 04/03/2015  . SOB (shortness of breath)   . Left elbow fracture 03/29/2015  . Dizziness 01/01/2015  . LBP (low back pain) 01/01/2015  . Malignant neoplasm of prostate ([place]) 01/01/2015  . BP (high blood pressure) 01/01/2015  . DOE (dyspnea on exertion) 10/26/2013  . Essential hypertension, benign 10/26/2013  . Post-traumatic wound infection 10/17/2012  . Cellulitis 10/17/2012  .  Atrial fibrillation ([place]) 10/22/2011  . DVT, lower extremity, distal, chronic ([place]) 10/22/2011  . Pacemaker 10/19/2011  . Prostate cancer ([place]) 10/19/2011  . COPD (chronic obstructive pulmonary disease) with emphysema ([place]) 02/04/2011  . Anemia 12/10/2010    Past Surgical History:  Procedure Laterality Date  . BALLOON DILATION N/A 01/14/2015   Procedure: BALLOON DILATION;  Surgeon: Garlan Fair, MD;  Location: [facility];  Service: Endoscopy;  Laterality: N/A;  . BALLOON DILATION N/A 10/12/2017   Procedure: BALLOON DILATION;  Surgeon: Otis Brace, MD;  Location: [facility];  Service: Gastroenterology;  Laterality: N/A;  . BIOPSY  10/12/2017   Procedure: BIOPSY;  Surgeon: Otis Brace, MD;  Location: [facility]COPY;  Service: Gastroenterology;;  . CARDIAC CATHETERIZATION  08-29-2000   [place]   no significant obstructive CAD, intact globle LV size and systolic function with regional wall motion abnormalities noted,  ?coronary spasm producing wall motion abnormality, elevated CPK and chest pain (ef 55%)  . CARDIAC PACEMAKER PLACEMENT  10-11-2006    dr Leonia Reeves   W/  ATRIAL LEAD REVISION 10-12-2006   (medtronic)  . CARDIOVASCULAR STRESS TEST  11-18-2011    dr Irish Lack   Low risk nuclear study w/ no ishemia/  normal wall motion,  post-stress ef 48% (LVEF 56% on prior study 2008)  . CARPAL TUNNEL RELEASE Left 11/14/2014   Procedure: CARPAL TUNNEL RELEASE LEFT THUMB;  Surgeon: Daryll Brod, MD;  Location: [facility];  Service: Orthopedics;  Laterality: Left;  ANESTHESIA:  IV REGIONAL FAB  . CATARACT EXTRACTION W/ INTRAOCULAR LENS  IMPLANT, BILATERAL    . CHOLECYSTECTOMY  10/23/2011  . CHOLECYSTECTOMY  10/23/2011   Procedure: CHOLECYSTECTOMY;  Surgeon: Rolm Bookbinder, MD;  Location: [facility];  Service: General;  Laterality: N/A;  with intraoperative cholangiogram  . COLONOSCOPY    . ESOPHAGOGASTRODUODENOSCOPY (EGD) WITH PROPOFOL N/A 01/14/2015   Procedure:  ESOPHAGOGASTRODUODENOSCOPY (EGD) WITH PROPOFOL;  Surgeon: Garlan Fair, MD;  Location: [facility];  Service: Endoscopy;  Laterality: N/A;  . ESOPHAGOGASTRODUODENOSCOPY (EGD) WITH PROPOFOL N/A 10/12/2017   Procedure: ESOPHAGOGASTRODUODENOSCOPY (EGD) WITH PROPOFOL;  Surgeon: Otis Brace, MD;  Location: [facility];  Service: Gastroenterology;  Laterality: N/A;  . I&D EXTREMITY Left 10/20/2012   Procedure: LEFT LEG IRRIGATION AND DEBRIDEMENT, AND WOUND VAC APPLICATION;  Surgeon: Marianna Payment, MD;  Location: [facility];  Service: Orthopedics;  Laterality: Left;  . I&D EXTREMITY Left 10/22/2012   Procedure: IRRIGATION AND DEBRIDEMENT EXTREMITY;  Surgeon: Marianna Payment, MD;  Location: [facility];  Service: Orthopedics;  Laterality: Left;  . INGUINAL HERNIA REPAIR Left yrs ago  . LAPAROSCOPIC ASSISTED VENTRAL HERNIA REPAIR  08-08-2009   dr Excell Seltzer   AND OPEN REPAIR RECURRENT LEFT INGUINAL HERNIA  . LAPAROSCOPIC INGUINAL HERNIA REPAIR Left 07-24-1999    dr Excell Seltzer  . OPEN VENTRAL HERNIA REPAIR /  LYSIS ADHESIONS  09-23-2010    dr Donne Hazel  . ORIF ELBOW FRACTURE Left 03/29/2015  Procedure: LEFT ELBOW OPEN REDUCTION INTERNAL FIXATION (ORIF) DISTAL HUMERUS FRACTURE WITH OLECRANON OSTEOTOMY AND ULNAR NERVE RELEASE;  Surgeon: Roseanne Kaufman, MD;  Location: [facility];  Service: Orthopedics;  Laterality: Left;  . REPAIR SUPRAUMBILICAL HERNIA   [phone]   dr Excell Seltzer  . SHOULDER ARTHROSCOPY WITH DISTAL CLAVICLE RESECTION Right 11-07-2007   dr Rhona Raider   [place] ACROMIOPLASTY  . SKIN SPLIT GRAFT Left 10/22/2012   Procedure: SKIN GRAFT SPLIT THICKNESS;  Surgeon: Marianna Payment, MD;  Location: [facility];  Service: Orthopedics;  Laterality: Left;  . TOTAL KNEE ARTHROPLASTY Right 1990's  . TRANSTHORACIC ECHOCARDIOGRAM  06-12-2012   dr Irish Lack   ef 50-55%/ mild LAE/ mild MR and TR/  AV sclerosis without stenosis/  RVSP 56mmHg  . TRANSURETHRAL RESECTION OF BLADDER TUMOR N/A 07/15/2017    Procedure: CYSTOSCOPY TRANSURETHRAL RESECTION OF BLADDER TUMOR (TURBT);  Surgeon: Kathie Rhodes, MD;  Location[facility];  Service: Urology;  Laterality: N/A;        Home Medications    Prior to Admission medications   Medication Sig Start Date End Date Taking? Authorizing Provider  acetaminophen (TYLENOL) 650 MG CR tablet Take 1,300 mg by mouth 2 (two) times daily.    Yes [provider]  albuterol (PROVENTIL HFA;VENTOLIN HFA) 108 (90 Base) MCG/ACT inhaler Inhale 2 puffs into the lungs every 6 (six) hours as needed for wheezing or shortness of breath. 09/19/15  Yes Rigoberto Noel, MD  aspirin EC 81 MG tablet Take 81 mg by mouth at bedtime.    Yes [provider]  Calcium Carbonate-Vitamin D (CALCIUM-D PO) Take 1 tablet by mouth every evening.    Yes [provider]  docusate sodium (COLACE) 100 MG capsule Take 100 mg by mouth daily as needed for mild constipation.    Yes [provider]  furosemide (LASIX) 20 MG tablet Take 20-40 mg by mouth See admin instructions. Take 20mg  daily.  May take an additional 20mg  for leg swelling   Yes [provider]  lisinopril (PRINIVIL,ZESTRIL) 10 MG tablet Take 10 mg by mouth every evening.   Yes [provider]  Multiple Vitamins-Minerals (PRESERVISION AREDS PO) Take 1 tablet by mouth 2 (two) times daily.   Yes [provider]  oxybutynin (DITROPAN XL) 15 MG 24 hr tablet Take 15 mg by mouth once a week.    Yes [provider]  oxyCODONE (OXY IR/ROXICODONE) 5 MG immediate release tablet Take 5 mg by mouth every 6 (six) hours as needed for severe pain.   Yes [provider]  pantoprazole (PROTONIX) 40 MG tablet Take 1 tablet (40 mg total) by mouth daily before breakfast. 10/12/17 10/12/18 Yes Brahmbhatt, Parag, MD  Umeclidinium-Vilanterol (ANORO ELLIPTA) 62.5-25 MCG/INH AEPB Inhale 1 puff into the lungs at bedtime.    Yes [provider]  vitamin B-12  (CYANOCOBALAMIN) 500 MCG tablet Take 500 mcg by mouth at bedtime.    Yes [provider]  predniSONE (DELTASONE) 10 MG tablet TAKE ONE TABLET BY MOUTH EVERY MORNING WITH BREAKFAST Patient not taking: Reported on 12/16/2017 11/15/17   Newt Minion, MD    Family History Family History  Problem Relation Age of Onset  . Heart disease Father   . Heart disease Mother   . Colon cancer Mother   . Stroke Mother   . Heart attack Neg Hx   . Hypertension Neg Hx     Social History Social History   Tobacco  Use  . Smoking status: Former Smoker    Packs/day: 1.50    Years: 35.00    Pack years: 52.50    Types: Cigarettes    Last attempt to quit: 02/15/1973    Years since quitting: 44.8  . Smokeless tobacco: Never Used  Substance Use Topics  . Alcohol use: No  . Drug use: No     Allergies   Patient has no known allergies.   Review of Systems Review of Systems All other systems negative except as documented in the HPI. All pertinent positives and negatives as reviewed in the HPI.   Physical Exam Updated Vital Signs BP (!) 169/74 (BP Location: Left Arm)   Pulse 62   Temp 98.4 F (36.9 C) (Oral)   Resp 16   SpO2 99%   Physical Exam  Constitutional: He is oriented to person, place, and time. He appears well-developed and well-nourished. No distress.  HENT:  Head: Normocephalic and atraumatic.  Mouth/Throat: Oropharynx is clear and moist.  Eyes: Pupils are equal, round, and reactive to light.  Neck: Normal range of motion. Neck supple.  Cardiovascular: Normal rate, regular rhythm and normal heart sounds. Exam reveals no gallop and no friction rub.  No murmur heard. Pulmonary/Chest: Effort normal and breath sounds normal. No respiratory distress. He has no wheezes.  Neurological: He is alert and oriented to person, place, and time. He exhibits normal muscle tone. Coordination normal.  Skin: Skin is warm and dry. Capillary refill takes less than 2 seconds. No rash noted.  No erythema.  Psychiatric: He has a normal mood and affect. His behavior is normal.  Nursing note and vitals reviewed.    ED Treatments / Results  Labs (all labs ordered are listed, but only abnormal results are displayed) Labs Reviewed - No data to display  EKG None  Radiology Dg Elbow Complete Right  Result Date: 12/16/2017 CLINICAL DATA:  Recent fall with elbow pain, initial encounter EXAM: RIGHT ELBOW - COMPLETE 3+ VIEW COMPARISON:  09/24/2009 FINDINGS: No acute fracture or dislocation is seen. No soft tissue abnormality IMPRESSION: No acute abnormality noted. Electronically Signed   By: [name] M.D.   On: 12/16/2017 21:35   Ct Head Wo Contrast  Result Date: 12/16/2017 CLINICAL DATA:  Recent fall with scalp hematoma, initial encounter EXAM: CT HEAD WITHOUT CONTRAST CT CERVICAL SPINE WITHOUT CONTRAST TECHNIQUE: Multidetector CT imaging of the head and cervical spine was performed following the standard protocol without intravenous contrast. Multiplanar CT image reconstructions of the cervical spine were also generated. COMPARISON:  11/22/2014 FINDINGS: CT HEAD FINDINGS Brain: Chronic atrophic changes and white matter ischemic changes are again seen and stable. No findings to suggest acute hemorrhage, acute infarction or space-occupying mass lesion are noted. Vascular: No hyperdense vessel or unexpected calcification. Skull: Normal. Negative for fracture or focal lesion. Sinuses/Orbits: No acute finding. Other: None. CT CERVICAL SPINE FINDINGS Alignment: Mild anterolisthesis of C7 on T1 is noted of a degenerative nature. Skull base and vertebrae: 7 cervical segments are well visualized. Vertebral body height is well maintained. Disc space narrowing is noted from C3-T1. Mild osteophytic changes are noted. Facet hypertrophic changes are seen. Again mild anterolisthesis of C7 with respect to T1 is noted of a degenerative nature. No acute fracture is seen. Soft tissues and spinal canal: No  soft tissue abnormality is noted Upper chest: Negative. Other: None IMPRESSION: CT of the head: Chronic atrophic and ischemic changes without acute abnormality. CT of the cervical spine: Chronic degenerative anterolisthesis of  C7 on T1 stable from a prior CT examination of 2018. No acute abnormality noted. Electronically Signed   By: [name] M.D.   On: 12/16/2017 21:03   Ct Cervical Spine Wo Contrast  Result Date: 12/16/2017 CLINICAL DATA:  Recent fall with scalp hematoma, initial encounter EXAM: CT HEAD WITHOUT CONTRAST CT CERVICAL SPINE WITHOUT CONTRAST TECHNIQUE: Multidetector CT imaging of the head and cervical spine was performed following the standard protocol without intravenous contrast. Multiplanar CT image reconstructions of the cervical spine were also generated. COMPARISON:  11/22/2014 FINDINGS: CT HEAD FINDINGS Brain: Chronic atrophic changes and white matter ischemic changes are again seen and stable. No findings to suggest acute hemorrhage, acute infarction or space-occupying mass lesion are noted. Vascular: No hyperdense vessel or unexpected calcification. Skull: Normal. Negative for fracture or focal lesion. Sinuses/Orbits: No acute finding. Other: None. CT CERVICAL SPINE FINDINGS Alignment: Mild anterolisthesis of C7 on T1 is noted of a degenerative nature. Skull base and vertebrae: 7 cervical segments are well visualized. Vertebral body height is well maintained. Disc space narrowing is noted from C3-T1. Mild osteophytic changes are noted. Facet hypertrophic changes are seen. Again mild anterolisthesis of C7 with respect to T1 is noted of a degenerative nature. No acute fracture is seen. Soft tissues and spinal canal: No soft tissue abnormality is noted Upper chest: Negative. Other: None IMPRESSION: CT of the head: Chronic atrophic and ischemic changes without acute abnormality. CT of the cervical spine: Chronic degenerative anterolisthesis of C7 on T1 stable from a prior CT examination  of 2018. No acute abnormality noted. Electronically Signed   By: [name] M.D.   On: 12/16/2017 21:03    Procedures Procedures (including critical care time)  Medications Ordered in ED Medications  lidocaine (XYLOCAINE) 2 % (with pres) injection 400 mg (has no administration in time range)     Initial Impression / Assessment and Plan / ED Course  I have reviewed the triage vital signs and the nursing notes.  Pertinent labs & imaging results that were available during my care of the patient were reviewed by me and considered in my medical decision making (see chart for details).     The patient has negative CT scans.  Did have to repair the small laceration to the right elbow.  Patient is fully alert and oriented.  Patient will be advised to follow-up with his primary doctor told to have sutures out in 10 to 14 days.  LACERATION REPAIR Performed by: [facility] Authorized by: Resa Miner Shaquan Puerta Consent: Verbal consent obtained. Risks and benefits: risks, benefits and alternatives were discussed Consent given by: patient Patient identity confirmed: provided demographic data Prepped and Draped in normal sterile fashion Wound explored  Laceration Location: r ELBOW  Laceration Length: 2 cm  No Foreign Bodies seen or palpated  Anesthesia: local infiltration  Local anesthetic: lidocaine 2% wo epinephrine  Anesthetic total: 5 ml  Irrigation method: syringe Amount of cleaning: standard  Skin closure: 4-0 Prolene  Number of sutures: 4  Technique: simple interrupted  Patient tolerance: Patient tolerated the procedure well with no immediate complications.   Final Clinical Impressions(s) / ED Diagnoses   Final diagnoses:  None    ED Discharge Orders    None       Dalia Heading, PA-C 12/16/17 2224    Margette Fast, MD 12/17/17 [phone]

## 2017-12-16 NOTE — Discharge Instructions (Addendum)
Have sutures out in 10 to 14 days.  Follow-up with your primary doctor.  The area clean and dry.

## 2017-12-16 NOTE — ED Notes (Signed)
Bed: WA10 Expected date:  Expected time:  Means of arrival:  Comments: EMS 84 yo fall 

## 2017-12-23 LAB — CUP PACEART REMOTE DEVICE CHECK
Battery Impedance: 5593 Ohm
Battery Voltage: 2.6 V
Brady Statistic AP VS Percent: 67 %
Brady Statistic AS VS Percent: 31 %
Date Time Interrogation Session: 20191014201132
Implantable Lead Implant Date: 20080826
Implantable Lead Location: 753859
Implantable Lead Location: 753860
Implantable Lead Model: 5076
Lead Channel Impedance Value: 562 Ohm
Lead Channel Pacing Threshold Pulse Width: 0.4 ms
Lead Channel Pacing Threshold Pulse Width: 0.4 ms
Lead Channel Setting Pacing Pulse Width: 0.4 ms
MDC IDC LEAD IMPLANT DT: 20080826
MDC IDC MSMT BATTERY REMAINING LONGEVITY: 4 mo
MDC IDC MSMT LEADCHNL RA IMPEDANCE VALUE: 470 Ohm
MDC IDC MSMT LEADCHNL RA PACING THRESHOLD AMPLITUDE: 1 V
MDC IDC MSMT LEADCHNL RV PACING THRESHOLD AMPLITUDE: 1.125 V
MDC IDC PG IMPLANT DT: 20080826
MDC IDC SET LEADCHNL RA PACING AMPLITUDE: 2 V
MDC IDC SET LEADCHNL RV PACING AMPLITUDE: 2.5 V
MDC IDC SET LEADCHNL RV SENSING SENSITIVITY: 2 mV
MDC IDC STAT BRADY AP VP PERCENT: 2 %
MDC IDC STAT BRADY AS VP PERCENT: 1 %

## 2017-12-26 ENCOUNTER — Encounter (INDEPENDENT_AMBULATORY_CARE_PROVIDER_SITE_OTHER): Payer: Self-pay | Admitting: Orthopedic Surgery

## 2017-12-26 ENCOUNTER — Ambulatory Visit (INDEPENDENT_AMBULATORY_CARE_PROVIDER_SITE_OTHER): Payer: Medicare Other | Admitting: Orthopedic Surgery

## 2017-12-26 VITALS — Ht 70.0 in | Wt 175.0 lb

## 2017-12-26 DIAGNOSIS — M6281 Muscle weakness (generalized): Secondary | ICD-10-CM | POA: Diagnosis not present

## 2017-12-26 DIAGNOSIS — S51012A Laceration without foreign body of left elbow, initial encounter: Secondary | ICD-10-CM | POA: Diagnosis not present

## 2017-12-26 DIAGNOSIS — M545 Low back pain: Secondary | ICD-10-CM | POA: Diagnosis not present

## 2017-12-26 DIAGNOSIS — G8929 Other chronic pain: Secondary | ICD-10-CM

## 2017-12-26 DIAGNOSIS — S51011D Laceration without foreign body of right elbow, subsequent encounter: Secondary | ICD-10-CM
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
## 2017-12-28 DIAGNOSIS — T148XXA Other injury of unspecified body region, initial encounter: Secondary | ICD-10-CM | POA: Diagnosis not present

## 2017-12-29 ENCOUNTER — Telehealth (INDEPENDENT_AMBULATORY_CARE_PROVIDER_SITE_OTHER): Payer: Self-pay

## 2017-12-29 ENCOUNTER — Encounter (INDEPENDENT_AMBULATORY_CARE_PROVIDER_SITE_OTHER): Payer: Self-pay

## 2017-12-29 NOTE — Progress Notes (Signed)
NEUROLOGY CONSULTATION NOTE  [name] [name] MRN: [id number] DOB: 1924/08/18  Referring provider: Annie Sable, MD Primary care provider: Yaakov Guthrie, MD  Reason for consult:  Balance problems  HISTORY OF PRESENT ILLNESS: [name] [name] is a 84 year old  male with COPD, chronic low back pain with degenerative disc disease of the lumbar spine with stenosis, CKD, peripheral neuropathy, s/p cardiac pacemaker and history of prostate cancer s/p radiation who presents for abnormal gait.  History supplemented by notes from prior neurology, orthopedics and referring provider.  He is accompanied by his wife who also supplements history.  He has longstanding history of dizziness, described as sensation of tightness across his forehead.  When he first wakes up in the morning he feels fine.  It occurs as the morning progresses.  It resolves when he is sitting back down for a while (such as in the car).  He denies spinning sensation or sensation that he is going to pass out.  It persists as long as he is on his feet and resolves once seated or lying down.  He also has longstanding history of polyneuropathy, going back over 20 years ago.  He has had an extensive workup by prior neurologists.  Labs from 2008 demonstrated normal B12 and SPEP/IFE except for slighthly elevated IGM.  A NCV/EMG from 2009 demonstrated moderately severe, length dependent, sensorimotor axonal polyneuropathy.  Repeat NCV/EMG from 2012 demonstrated mild right carpal tunnel syndrome, mild axonal sensorimotor neuropathy with minimal progression and no evidence for lumbosacral radiculopathy.  He most recently was evaluated by a neuromuscular specialist at [facility] in September 2018, who confirmed an idiopathic polyneuropathy.  Labs looking for common causes of polyneuropathy were unremarkable, including B12 of 1,323 and unremarkable SPEP/IFE with no M spike.  He also has chronic low back pain with known lumbar degenerative disc  disease and spinal stenosis. For about a year, he also endorses right lower extremity weakness.  MRI of lumbar spine from 10/18/06 was personally reviewed and demonstrated multilevel degenerative changes with severe multifactorial central canal stenosis at L3-4 with severe left L3 subarticular lateral recess stenosis and foraminal narrowing, as well as multifactorial moderate foraminal narrowing on the right at L4-5 and L5-S1.  This was again demonstrated on a CT of the lumbar spine from 03/29/14 and later on 08/16/17.  He continues to have falls.  When he falls, he is not using his walker.  Most recent CT of the head in the ED for evaluation status post fall from 12/16/17 was personally reviewed and demonstrated chronic atrophy and small vessel ischemic changes.  CT of cervical spine showed chronic degenerative anterolisthesis of C7 on T1.  He does have swelling in both distal legs, for which he wears compression stockings.  PAST MEDICAL HISTORY: Past Medical History:  Diagnosis Date  . Abnormality of gait    due to low back pain , uses  cane and walker  . Arthritis   . Bilateral lower extremity edema    wears TED hose  . Cancer [facility])    prostate- treated with radiation  . Cardiac pacemaker in situ 10/11/2006   medtronic  . Chronic iron deficiency anemia oncologist/ hematoloist-- dr Alen Blew   treatment IV Iron infusions  . Chronic low back pain   . CKD (chronic kidney disease), stage III ([place])   . COPD with emphysema [facility])    pulmologist-  dr Elsworth Soho  . DDD (degenerative disc disease), lumbosacral   . Degenerative scoliosis   . Dyspnea  with exertion  . Full dentures   . GERD (gastroesophageal reflux disease)   . Gross hematuria   . Headache    history of migraines- "way in the past"  . Hereditary and idiopathic peripheral neuropathy    bilateral lower leg  . Hiatal hernia   . History of DVT of lower extremity 10/19/2011   chronic distal popliteal right lower extremity  . History of  external beam radiation therapy 2008   prostate cancer  . History of pancreatitis 2013  . History of pneumothorax 09/2006   iatrogenic-- in setting cardiac pacemaker placement  . History of prostate cancer 2008   s/p  external radiation therapy  . Hypertension   . Lesion of bladder   . Macular degeneration of right eye   . Nocturia   . OSA on CPAP   . PAF (paroxysmal atrial fibrillation) ([place]) 10/2011   1st episode documented 09/ 2013 admission for gallstons/ pancreatitis and prior to cholecystectomy , went back in NSR without interventeion  . Presence of permanent cardiac pacemaker   . Sinus node dysfunction (HCC)    s/p  cardiac pacemaker placement 10-11-2006  . Wears glasses   . Wears hearing aid in both ears     PAST SURGICAL HISTORY: Past Surgical History:  Procedure Laterality Date  . BALLOON DILATION N/A 01/14/2015   Procedure: BALLOON DILATION;  Surgeon: Garlan Fair, MD;  Location: [facility];  Service: Endoscopy;  Laterality: N/A;  . BALLOON DILATION N/A 10/12/2017   Procedure: BALLOON DILATION;  Surgeon: Otis Brace, MD;  Location: [place] [facility];  Service: Gastroenterology;  Laterality: N/A;  . BIOPSY  10/12/2017   Procedure: BIOPSY;  Surgeon: Otis Brace, MD;  Location: [facility];  Service: Gastroenterology;;  . CARDIAC CATHETERIZATION  08-29-2000   [place]   no significant obstructive CAD, intact globle LV size and systolic function with regional wall motion abnormalities noted,  ?coronary spasm producing wall motion abnormality, elevated CPK and chest pain (ef 55%)  . CARDIAC PACEMAKER PLACEMENT  10-11-2006    dr Leonia Reeves   W/  ATRIAL LEAD REVISION 10-12-2006   (medtronic)  . CARDIOVASCULAR STRESS TEST  11-18-2011    dr Irish Lack   Low risk nuclear study w/ no ishemia/  normal wall motion,  post-stress ef 48% (LVEF 56% on prior study 2008)  . CARPAL TUNNEL RELEASE Left 11/14/2014   Procedure: CARPAL TUNNEL RELEASE LEFT THUMB;  Surgeon: Daryll Brod, MD;   Location: [place];  Service: Orthopedics;  Laterality: Left;  ANESTHESIA:  IV REGIONAL FAB  . CATARACT EXTRACTION W/ INTRAOCULAR LENS  IMPLANT, BILATERAL    . CHOLECYSTECTOMY  10/23/2011  . CHOLECYSTECTOMY  10/23/2011   Procedure: CHOLECYSTECTOMY;  Surgeon: Rolm Bookbinder, MD;  Location: [place];  Service: General;  Laterality: N/A;  with intraoperative cholangiogram  . COLONOSCOPY    . ESOPHAGOGASTRODUODENOSCOPY (EGD) WITH PROPOFOL N/A 01/14/2015   Procedure: ESOPHAGOGASTRODUODENOSCOPY (EGD) WITH PROPOFOL;  Surgeon: Garlan Fair, MD;  Location: [facility];  Service: Endoscopy;  Laterality: N/A;  . ESOPHAGOGASTRODUODENOSCOPY (EGD) WITH PROPOFOL N/A 10/12/2017   Procedure: ESOPHAGOGASTRODUODENOSCOPY (EGD) WITH PROPOFOL;  Surgeon: Otis Brace, MD;  Location: [place];  Service: Gastroenterology;  Laterality: N/A;  . I&D EXTREMITY Left 10/20/2012   Procedure: LEFT LEG IRRIGATION AND DEBRIDEMENT, AND WOUND VAC APPLICATION;  Surgeon: Marianna Payment, MD;  Location: [facility];  Service: Orthopedics;  Laterality: Left;  . I&D EXTREMITY Left 10/22/2012   Procedure: IRRIGATION AND DEBRIDEMENT EXTREMITY;  Surgeon: Marianna Payment, MD;  Location: [facility];  Service: Orthopedics;  Laterality: Left;  . INGUINAL HERNIA REPAIR Left yrs ago  . LAPAROSCOPIC ASSISTED VENTRAL HERNIA REPAIR  08-08-2009   dr Excell Seltzer   AND OPEN REPAIR RECURRENT LEFT INGUINAL HERNIA  . LAPAROSCOPIC INGUINAL HERNIA REPAIR Left 07-24-1999    dr Excell Seltzer  . OPEN VENTRAL HERNIA REPAIR /  LYSIS ADHESIONS  09-23-2010    dr Donne Hazel  . ORIF ELBOW FRACTURE Left 03/29/2015   Procedure: LEFT ELBOW OPEN REDUCTION INTERNAL FIXATION (ORIF) DISTAL HUMERUS FRACTURE WITH OLECRANON OSTEOTOMY AND ULNAR NERVE RELEASE;  Surgeon: Roseanne Kaufman, MD;  Location: [place];  Service: Orthopedics;  Laterality: Left;  . REPAIR SUPRAUMBILICAL HERNIA   [id number]   dr Excell Seltzer  . SHOULDER ARTHROSCOPY WITH DISTAL CLAVICLE RESECTION Right  11-07-2007   dr Rhona Raider   [place] ACROMIOPLASTY  . SKIN SPLIT GRAFT Left 10/22/2012   Procedure: SKIN GRAFT SPLIT THICKNESS;  Surgeon: Marianna Payment, MD;  [facility];  Service: Orthopedics;  Laterality: Left;  . TOTAL KNEE ARTHROPLASTY Right 1990's  . TRANSTHORACIC ECHOCARDIOGRAM  06-12-2012   dr Irish Lack   ef 50-55%/ mild LAE/ mild MR and TR/  AV sclerosis without stenosis/  RVSP 74mmHg  . TRANSURETHRAL RESECTION OF BLADDER TUMOR N/A 07/15/2017   Procedure: CYSTOSCOPY TRANSURETHRAL RESECTION OF BLADDER TUMOR (TURBT);  Surgeon: Kathie Rhodes, MD;  Location: [facility];  Service: Urology;  Laterality: N/A;    MEDICATIONS: Current Outpatient Medications on File Prior to Visit  Medication Sig Dispense Refill  . acetaminophen (TYLENOL) 650 MG CR tablet Take 1,300 mg by mouth 2 (two) times daily.     Marland Kitchen albuterol (PROVENTIL HFA;VENTOLIN HFA) 108 (90 Base) MCG/ACT inhaler Inhale 2 puffs into the lungs every 6 (six) hours as needed for wheezing or shortness of breath. 1 Inhaler 6  . aspirin EC 81 MG tablet Take 81 mg by mouth at bedtime.     . Calcium Carbonate-Vitamin D (CALCIUM-D PO) Take 1 tablet by mouth every evening.     . docusate sodium (COLACE) 100 MG capsule Take 100 mg by mouth daily as needed for mild constipation.     . furosemide (LASIX) 20 MG tablet Take 20-40 mg by mouth See admin instructions. Take 20mg  daily.  May take an additional 20mg  for leg swelling    . lisinopril (PRINIVIL,ZESTRIL) 10 MG tablet Take 10 mg by mouth every evening.    . Multiple Vitamins-Minerals (PRESERVISION AREDS PO) Take 1 tablet by mouth 2 (two) times daily.    Marland Kitchen oxybutynin (DITROPAN XL) 15 MG 24 hr tablet Take 15 mg by mouth once a week.     Marland Kitchen oxyCODONE (OXY IR/ROXICODONE) 5 MG immediate release tablet Take 5 mg by mouth every 6 (six) hours as needed for severe pain.    . pantoprazole (PROTONIX) 40 MG tablet Take 1 tablet (40 mg total) by mouth daily before  breakfast. 90 tablet 1  . predniSONE (DELTASONE) 10 MG tablet TAKE ONE TABLET BY MOUTH EVERY MORNING WITH BREAKFAST 90 tablet 0  . Umeclidinium-Vilanterol (ANORO ELLIPTA) 62.5-25 MCG/INH AEPB Inhale 1 puff into the lungs at bedtime.     . vitamin B-12 (CYANOCOBALAMIN) 500 MCG tablet Take 500 mcg by mouth at bedtime.      No current facility-administered medications on file prior to visit.     ALLERGIES: No Known Allergies  FAMILY HISTORY: Family History  Problem Relation Age of Onset  . Heart disease Father   .  Heart disease Mother   . Colon cancer Mother   . Stroke Mother   . Heart attack Neg Hx   . Hypertension Neg Hx     SOCIAL HISTORY: Social History   Socioeconomic History  . Marital status: Married    Spouse name: martha  . Number of children: 3  . Years of education: 83  . Highest education level: Not on file  Occupational History  . 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 Financial resource strain: Not on file  . Food insecurity:    Worry: Not on file    Inability: Not on file  . Transportation needs:    Medical: Not on file    Non-medical: Not on file  Tobacco Use  . Smoking status: Former Smoker    Packs/day: 1.50    Years: 35.00    Pack years: 52.50    Types: Cigarettes    Last attempt to quit: 02/15/1973    Years since quitting: 44.8  . Smokeless tobacco: Never Used  Substance and Sexual Activity  . Alcohol use: No  . Drug use: No  . Sexual activity: Never  Lifestyle  . Physical activity:    Days per week: Not on file    Minutes per session: Not on file  . Stress: Not on file  Relationships  . Social connections:    Talks on phone: Not on file    Gets together: Not on file    Attends religious service: Not on file    Active member of club or organization: Not on file    Attends meetings of clubs or organizations: Not on file    Relationship status: Not on file  . Intimate partner  violence:    Fear of current or ex partner: Not on file    Emotionally abused: Not on file    Physically abused: Not on file    Forced sexual activity: Not on file  Other Topics Concern  . Not on file  Social History Narrative   Lives at home w/ his wife, [name]   Right-handed   Drinks about 1 soda per day, decaf coffee    REVIEW OF SYSTEMS: Constitutional: No fevers, chills, or sweats, no generalized fatigue, change in appetite Eyes: No visual changes, double vision, eye pain Ear, nose and throat: No hearing loss, ear pain, nasal congestion, sore throat Cardiovascular: No chest pain, palpitations Respiratory:  No shortness of breath at rest or with exertion, wheezes GastrointestinaI: No nausea, vomiting, diarrhea, abdominal pain, fecal incontinence Genitourinary:  No dysuria, urinary retention or frequency Musculoskeletal:  No neck pain, back pain Integumentary: No rash, pruritus, skin lesions Neurological: as above Psychiatric: No depression, insomnia, anxiety Endocrine: No palpitations, fatigue, diaphoresis, mood swings, change in appetite, change in weight, increased thirst Hematologic/Lymphatic:  No purpura, petechiae. Allergic/Immunologic: no itchy/runny eyes, nasal congestion, recent allergic reactions, rashes  PHYSICAL EXAM: Blood pressure (!) 108/48, pulse 64, height 5\' 10"  (1.778 m), weight 173 lb (78.5 kg), SpO2 92 %. General: No acute distress.  Patient appears well-groomed.  Head:  Normocephalic/atraumatic Eyes:  fundi examined but not visualized Neck: supple, no paraspinal tenderness, full range of motion Back: No paraspinal tenderness Heart: regular rate and rhythm Lungs: Clear to auscultation bilaterally. Vascular: No carotid bruits. Neurological Exam: Mental status: alert and oriented to person, place, and time, recent and remote memory intact, fund of knowledge intact, attention and concentration intact, speech fluent and not  dysarthric, language  intact. Cranial nerves: CN I: not tested CN II: pupils equal, round and reactive to light, visual fields intact CN III, IV, VI:  full range of motion, no nystagmus, no ptosis CN V: facial sensation intact CN VII: upper and lower face symmetric CN VIII: hearing intact CN IX, X: gag intact, uvula midline CN XI: sternocleidomastoid and trapezius muscles intact CN XII: tongue midline Bulk & Tone: normal, no fasciculations. Motor: 5-/5 right hip flexion and knee extension.  Otherwise 5/5 throughout. Sensation: Pinprick sensation reduced bilaterally, more prominent over the dorsum and anterior leg on the right.  Vibratory sensation reduced bilaterally above the knees. Deep Tendon Reflexes: Trace in the upper extremities, absent in the lower extremities, toes downgoing. Finger to nose testing:  Without dysmetria.  Gait:  Cautious wide-based and relies on walker.  Patient cannot tandem walk.  Romberg deferred.  IMPRESSION: 1.  Unsteady gait.  Multifactorial.  Related to lumbar spinal stenosis with right sided radiculopathy and underlying idiopathic polyneuropathy.  Dizziness described may be tension type headache   PLAN: Supportive care.  Physical therapy and use of assisted device.  He has had extensive testing already.  From a neurologic standpoint, no further testing or recommendations.  Thank you for allowing me to take part in the care of this patient.  Metta Clines, DO  CC:  Yaakov Guthrie, MD  Annie Sable, MD

## 2017-12-29 NOTE — Telephone Encounter (Signed)
Letter ready for pick up. Will call patient to let him know.

## 2017-12-30 ENCOUNTER — Ambulatory Visit (INDEPENDENT_AMBULATORY_CARE_PROVIDER_SITE_OTHER): Payer: Medicare Other | Admitting: Neurology

## 2017-12-30 ENCOUNTER — Telehealth: Payer: Self-pay | Admitting: Pulmonary Disease

## 2017-12-30 ENCOUNTER — Encounter: Payer: Self-pay | Admitting: Neurology

## 2017-12-30 VITALS — BP 108/48 | HR 64 | Ht 70.0 in | Wt 173.0 lb

## 2017-12-30 DIAGNOSIS — R42 Dizziness and giddiness: Secondary | ICD-10-CM | POA: Diagnosis not present

## 2017-12-30 DIAGNOSIS — R269 Unspecified abnormalities of gait and mobility: Secondary | ICD-10-CM

## 2017-12-30 DIAGNOSIS — G609 Hereditary and idiopathic neuropathy, unspecified: Secondary | ICD-10-CM | POA: Diagnosis not present

## 2017-12-30 DIAGNOSIS — M48061 Spinal stenosis, lumbar region without neurogenic claudication: Secondary | ICD-10-CM | POA: Diagnosis not present

## 2017-12-30 NOTE — Telephone Encounter (Signed)
Attempted to call patient today regarding cpap machine not working well. I did not receive an answer at time of call. I have left a voicemail message for pt to return call. X1

## 2017-12-30 NOTE — Telephone Encounter (Signed)
I spoke with patient. He would like letter mailed.  Verified address, mailed.

## 2017-12-30 NOTE — Patient Instructions (Signed)
I think the balance problems are due to the neuropathy and pinched nerves in the back.  I think the leg weakness is due to the pinched nerve in the back.  I would continue using the walker at all times.  I agree with physical therapy

## 2018-01-02 NOTE — Telephone Encounter (Signed)
Attempted to call pt multiple times but each time I tried calling, I received a busy signal. Will try to call back later.

## 2018-01-02 NOTE — Telephone Encounter (Signed)
Patient returned phone call; pt contact # [phone]

## 2018-01-02 NOTE — Telephone Encounter (Signed)
Spoke with patient. He stated that he had seen his PCP Dr. Jacelyn Grip a few weeks ago and he stated that he contact our office to get information on his cpap machine. Advised patient that per his chart, no one has called [place]. Advised him that I could call Dr. Jodi Mourning office, he verbalized understanding.   Called Dr. Jodi Mourning office and no one answered. Will attempt to call them back.

## 2018-01-04 ENCOUNTER — Other Ambulatory Visit: Payer: Self-pay | Admitting: *Deleted

## 2018-01-04 NOTE — Telephone Encounter (Signed)
Called Dr. Jodi Mourning office and spoke with Tamra in regards to the information we received from pt. Per Tamra, there was a message that seemed like [facility] was the ones who was needing to get in touch with [place] and she asked me if we had heard from [place].  I stated to Tamra that we have not heard anything recently. Stated to her the last time we saw pt at office was 08/30/17 and anytime pt's have OV's with [place], if any orders need to be placed and sent to DME, we do that at pt's OV. Tamra stated she would check to see if any information was needed still in regards to this and would have someone from Dr. Jodi Mourning office call us back. Will keep encounter open.

## 2018-01-04 NOTE — Telephone Encounter (Signed)
Called Dr. Jodi Mourning office and spoke with Sharee Pimple who stated pt had told them he was needing a new cpap machine. Per Sharee Pimple, Dr. Jodi Mourning office told pt to call pulmonary in regards to this as they have no settings or anything in regards to pt's cpap.  Due to it being since July that pt was last seen, we would need to get pt to come in for an appt. I was looking through pt's chart and even in the snapshot and I could not find any settings documented for pt's cpap.  Attempted to call pt to see if I could get him scheduled for an appt so we can address his cpap needs but unable to reach pt and unable to leave a VM due to no machine kicking in. Will try to call back later.

## 2018-01-04 NOTE — Telephone Encounter (Signed)
[name] [name] from Dr. Jacelyn Grip office returning call CB [phone]

## 2018-01-04 NOTE — Patient Outreach (Signed)
[facility]) Care Management  01/04/2018  BRAYSON LIVESEY 1924-11-21 [id number]  Call received from the office of Jory Sims DNP requesting assistance with patient/spouse need for guidance with in home care services and VA benefits and services.   I spoke briefly with Mrs. Botero by phone today. Mr. Kaigler asked Dr. Purcell Nails to have [place] call his wife to review his needs and provide recommendations. Mrs. Ek reports that she and her husband and capable of performing individual ADL's but are experiencing increasing difficulty with household management. Mrs. Omalley reports that Mr. Crisafulli has been using a walker x the last 6 months but has had worsening difficulty with gait and has had 2 falls over the last month which resulted in visits to the ED. Mr. Thorington sits on a stool to cook/prepare meals, drives, and can ambulate about the grocery store pushing a grocery cart short distances.   Mrs. Rickels confirms that Mr. Angerer has benefits through the [place] and receives various treatments at the [facility] medical center. However, Mr. Dettman has never inquired with the VA re: in home care benefits and would like to find out how to make contact and activate any such benefits if they are available. If his VA benefits do not include in home care, he would like to explore other community resources.   Plan: Mrs. Pember agreed to consultation with our social work team and I have referred Mr. Meter for this assistance.    [facility]  [phone]

## 2018-01-05 ENCOUNTER — Other Ambulatory Visit: Payer: Self-pay

## 2018-01-05 ENCOUNTER — Ambulatory Visit: Payer: Self-pay | Admitting: Pulmonary Disease

## 2018-01-05 NOTE — Patient Outreach (Signed)
[facility]) Care Management  01/05/2018  [name] [name] 1924/11/16 [id number]  BSW placed a call to the patients home on today's date. Unfortunately, the patient is currently at the [facility] for a medical appointment. BSW spoke with the patients wife regarding recent referral to [place] [facility] for assistance with caregiver resources. [name] [name] states "the help is for me so you can talk to me". [name] [name] acknowledges she and the patient are both "getting older" and "[name] [name] in heart care" placed a referral to [place] [facility] for resource assistance.  [name] [name] reports the patient is independent in all ADL's and is able to drive himself. [name] [name] reports seeking assistance with iADL's such as meal preparation and cooking. BSW briefly discussed the aid and attendance program provided as a veteran benefit. [name] [name] does not believe the patient served during a wartime but is not positive. BSW explained that if the patient does not qualify for aid and attendance the patient could privately hire someone to assist with iADL's. [name] [name] stated "they told me it would be free". [name] [name] requests BSW speak with the patient regarding resources.  Plan: BSW to plan to outreach the patient in the next week to screen for aid and attendance eligibility.   Daneen Schick, BSW, CDP [facility] [phone]

## 2018-01-05 NOTE — Progress Notes (Deleted)
@Patient  ID: [name] [name], male    DOB: [birth year], 84 y.o.   MRN: [id number]  No chief complaint on file.   Referring provider: [name], MD  HPI:  84 year old male followed in our office for COPD and obstructive sleep apnea currently managed on CPAP  PMH:  Smoker/ Smoking History:  Maintenance:   Pt of: [name]  Recent [facility] Encounters:     01/05/2018  - Visit   HPI    Tests:    FENO:  No results found for: NITRICOXIDE  PFT: No flowsheet data found.  Imaging: Dg Elbow Complete Right  Result Date: 12/16/2017 CLINICAL DATA:  Recent fall with elbow pain, initial encounter EXAM: RIGHT ELBOW - COMPLETE 3+ VIEW COMPARISON:  09/24/2009 FINDINGS: No acute fracture or dislocation is seen. No soft tissue abnormality IMPRESSION: No acute abnormality noted. Electronically Signed   By: [name] M.D.   On: 12/16/2017 21:35   Ct Head Wo Contrast  Result Date: 12/16/2017 CLINICAL DATA:  Recent fall with scalp hematoma, initial encounter EXAM: CT HEAD WITHOUT CONTRAST CT CERVICAL SPINE WITHOUT CONTRAST TECHNIQUE: Multidetector CT imaging of the head and cervical spine was performed following the standard protocol without intravenous contrast. Multiplanar CT image reconstructions of the cervical spine were also generated. COMPARISON:  11/22/2014 FINDINGS: CT HEAD FINDINGS Brain: Chronic atrophic changes and white matter ischemic changes are again seen and stable. No findings to suggest acute hemorrhage, acute infarction or space-occupying mass lesion are noted. Vascular: No hyperdense vessel or unexpected calcification. Skull: Normal. Negative for fracture or focal lesion. Sinuses/Orbits: No acute finding. Other: None. CT CERVICAL SPINE FINDINGS Alignment: Mild anterolisthesis of C7 on T1 is noted of a degenerative nature. Skull base and vertebrae: 7 cervical segments are well visualized. Vertebral body height is well maintained. Disc space narrowing is noted  from C3-T1. Mild osteophytic changes are noted. Facet hypertrophic changes are seen. Again mild anterolisthesis of C7 with respect to T1 is noted of a degenerative nature. No acute fracture is seen. Soft tissues and spinal canal: No soft tissue abnormality is noted Upper chest: Negative. Other: None IMPRESSION: CT of the head: Chronic atrophic and ischemic changes without acute abnormality. CT of the cervical spine: Chronic degenerative anterolisthesis of C7 on T1 stable from a prior CT examination of 2018. No acute abnormality noted. Electronically Signed   By: [name] M.D.   On: 12/16/2017 21:03   Ct Cervical Spine Wo Contrast  Result Date: 12/16/2017 CLINICAL DATA:  Recent fall with scalp hematoma, initial encounter EXAM: CT HEAD WITHOUT CONTRAST CT CERVICAL SPINE WITHOUT CONTRAST TECHNIQUE: Multidetector CT imaging of the head and cervical spine was performed following the standard protocol without intravenous contrast. Multiplanar CT image reconstructions of the cervical spine were also generated. COMPARISON:  11/22/2014 FINDINGS: CT HEAD FINDINGS Brain: Chronic atrophic changes and white matter ischemic changes are again seen and stable. No findings to suggest acute hemorrhage, acute infarction or space-occupying mass lesion are noted. Vascular: No hyperdense vessel or unexpected calcification. Skull: Normal. Negative for fracture or focal lesion. Sinuses/Orbits: No acute finding. Other: None. CT CERVICAL SPINE FINDINGS Alignment: Mild anterolisthesis of C7 on T1 is noted of a degenerative nature. Skull base and vertebrae: 7 cervical segments are well visualized. Vertebral body height is well maintained. Disc space narrowing is noted from C3-T1. Mild osteophytic changes are noted. Facet hypertrophic changes are seen. Again mild anterolisthesis of C7 with respect to T1 is noted of a degenerative nature. No acute  fracture is seen. Soft tissues and spinal canal: No soft tissue abnormality is noted Upper  chest: Negative. Other: None IMPRESSION: CT of the head: Chronic atrophic and ischemic changes without acute abnormality. CT of the cervical spine: Chronic degenerative anterolisthesis of C7 on T1 stable from a prior CT examination of 2018. No acute abnormality noted. Electronically Signed   By: [name] M.D.   On: 12/16/2017 21:03    Chart Review:    Specialty Problems      Pulmonary Problems   COPD (chronic obstructive pulmonary disease) with emphysema ([place])    PFT's 2013:  FEV1 2.13 (85%), ratio 52, nl TLC, normal DLCO [place] 2014:  FEV1 1.63 (55%), ratio 51 No change with LABA/ICS [place] 2015:  FEV1 1.92 (63%), ratio 55       DOE (dyspnea on exertion)   SOB (shortness of breath)   OSA (obstructive sleep apnea)    [facility]         No Known Allergies  Immunization History  Administered Date(s) Administered  . Influenza Split 11/02/2015  . Influenza, High Dose Seasonal PF 11/14/2016  . Influenza,inj,Quad PF,6+ Mos 10/16/2012  . Influenza-Unspecified 11/15/2013  . Pneumococcal Conjugate-13 06/03/2014  . Pneumococcal-Unspecified 05/04/2013  . Tdap 10/05/2012  . Zoster 02/16/2011  . Zoster Recombinat (Shingrix) 04/15/2017, 06/15/2017    Past Medical History:  Diagnosis Date  . Abnormality of gait    due to low back pain , uses  cane and walker  . Arthritis   . Bilateral lower extremity edema    wears TED hose  . Cancer [facility])    prostate- treated with radiation  . Cardiac pacemaker in situ 10/11/2006   medtronic  . Chronic iron deficiency anemia oncologist/ hematoloist-- dr Alen Blew   treatment IV Iron infusions  . Chronic low back pain   . CKD (chronic kidney disease), stage III ([place])   . COPD with emphysema [facility])    pulmologist-  dr Elsworth Soho  . DDD (degenerative disc disease), lumbosacral   . Degenerative scoliosis   . Dyspnea    with exertion  . Full dentures   . GERD (gastroesophageal reflux disease)   . Gross hematuria   . Headache    history of  migraines- "way in the past"  . Hereditary and idiopathic peripheral neuropathy    bilateral lower leg  . Hiatal hernia   . History of DVT of lower extremity 10/19/2011   chronic distal popliteal right lower extremity  . History of external beam radiation therapy 2008   prostate cancer  . History of pancreatitis 2013  . History of pneumothorax 09/2006   iatrogenic-- in setting cardiac pacemaker placement  . History of prostate cancer 2008   s/p  external radiation therapy  . Hypertension   . Lesion of bladder   . Macular degeneration of right eye   . Nocturia   . OSA on CPAP   . PAF (paroxysmal atrial fibrillation) ([place]) 10/2011   1st episode documented 09/ 2013 admission for gallstons/ pancreatitis and prior to cholecystectomy , went back in NSR without interventeion  . Presence of permanent cardiac pacemaker   . Sinus node dysfunction ([facility])    s/p  cardiac pacemaker placement 10-11-2006  . Wears glasses   . Wears hearing aid in both ears     Tobacco History: Social History   Tobacco Use  Smoking Status Former Smoker  . Packs/day: 1.50  . Years: 35.00  . Pack years: 52.50  . Types: Cigarettes  . Last  attempt to quit: 02/15/1973  . Years since quitting: 44.9  Smokeless Tobacco Never Used   Counseling given: Not Answered   Outpatient Encounter Medications as of 01/05/2018  Medication Sig  . acetaminophen (TYLENOL) 650 MG CR tablet Take 1,300 mg by mouth 2 (two) times daily.   Marland Kitchen albuterol (PROVENTIL HFA;VENTOLIN HFA) 108 (90 Base) MCG/ACT inhaler Inhale 2 puffs into the lungs every 6 (six) hours as needed for wheezing or shortness of breath.  Marland Kitchen aspirin EC 81 MG tablet Take 81 mg by mouth at bedtime.   . Calcium Carbonate-Vitamin D (CALCIUM-D PO) Take 1 tablet by mouth every evening.   . docusate sodium (COLACE) 100 MG capsule Take 100 mg by mouth daily as needed for mild constipation.   . furosemide (LASIX) 20 MG tablet Take 20-40 mg by mouth See admin instructions. Take  20mg  daily.  May take an additional 20mg  for leg swelling  . lisinopril (PRINIVIL,ZESTRIL) 10 MG tablet Take 10 mg by mouth every evening.  . Multiple Vitamins-Minerals (PRESERVISION AREDS PO) Take 1 tablet by mouth 2 (two) times daily.  Marland Kitchen oxybutynin (DITROPAN XL) 15 MG 24 hr tablet Take 15 mg by mouth once a week.   Marland Kitchen oxyCODONE (OXY IR/ROXICODONE) 5 MG immediate release tablet Take 5 mg by mouth every 6 (six) hours as needed for severe pain.  . pantoprazole (PROTONIX) 40 MG tablet Take 1 tablet (40 mg total) by mouth daily before breakfast.  . predniSONE (DELTASONE) 10 MG tablet TAKE ONE TABLET BY MOUTH EVERY MORNING WITH BREAKFAST  . Umeclidinium-Vilanterol (ANORO ELLIPTA) 62.5-25 MCG/INH AEPB Inhale 1 puff into the lungs at bedtime.   . vitamin B-12 (CYANOCOBALAMIN) 500 MCG tablet Take 500 mcg by mouth at bedtime.    No facility-administered encounter medications on file as of 01/05/2018.      Review of Systems  Review of Systems   Physical Exam  There were no vitals taken for this visit.  Wt Readings from Last 5 Encounters:  12/30/17 173 lb (78.5 kg)  12/26/17 175 lb (79.4 kg)  12/01/17 175 lb (79.4 kg)  09/27/17 175 lb (79.4 kg)  09/06/17 175 lb (79.4 kg)     Physical Exam    Lab Results:  CBC    Component Value Date/Time   WBC 7.4 05/17/2017 1112   RBC 3.35 (L) 05/17/2017 1112   HGB 9.9 (L) 07/15/2017 0734   HGB 10.3 (L) 11/16/2016 1118   HCT 29.0 (L) 07/15/2017 0734   HCT 32.5 (L) 11/16/2016 1118   PLT 148 05/17/2017 1112   PLT 127 (L) 11/16/2016 1118   MCV 99.3 (H) 05/17/2017 1112   MCV 95.6 11/16/2016 1118   MCH 32.8 05/17/2017 1112   MCHC 33.0 05/17/2017 1112   RDW 15.1 (H) 05/17/2017 1112   RDW 13.7 11/16/2016 1118   LYMPHSABS 0.9 05/17/2017 1112   LYMPHSABS 1.4 11/16/2016 1118   MONOABS 0.4 05/17/2017 1112   MONOABS 0.3 11/16/2016 1118   EOSABS 0.3 05/17/2017 1112   EOSABS 0.2 11/16/2016 1118   BASOSABS 0.1 05/17/2017 1112   BASOSABS 0.0  11/16/2016 1118    BMET    Component Value Date/Time   NA 141 07/15/2017 0734   NA 143 05/18/2016 1254   K 4.0 07/15/2017 0734   K 4.1 05/18/2016 1254   CL 106 07/15/2017 0734   CO2 24 05/18/2016 1254   GLUCOSE 92 07/15/2017 0734   GLUCOSE 102 05/18/2016 1254   BUN 28 (H) 07/15/2017 0734   BUN 36.4 (H)  05/18/2016 1254   CREATININE 1.00 07/15/2017 0734   CREATININE 1.4 (H) 05/18/2016 1254   CALCIUM 9.3 05/18/2016 1254   GFRNONAA 59 (L) 03/31/2015 0357   GFRAA >60 03/31/2015 0357    BNP No results found for: BNP  ProBNP    Component Value Date/Time   PROBNP 1,315.0 (H) 10/23/2012 0419      Assessment & Plan:     No problem-specific Assessment & Plan notes found for this encounter.     Lauraine Rinne, NP 01/05/2018

## 2018-01-05 NOTE — Telephone Encounter (Signed)
Called and spoke with [name] [name] stating to him after talking with Dr. Jodi Mourning office, we needed to get him scheduled to come in for an OV. [name] [name] expressed understanding. I have scheduled [name] [name] an appt this afternoon, 01/05/18 with Wyn Quaker, NP at 3pm. I did give [name] [name] our new office address. Nothing further needed.

## 2018-01-06 ENCOUNTER — Encounter: Payer: Self-pay | Admitting: Pulmonary Disease

## 2018-01-06 ENCOUNTER — Ambulatory Visit (INDEPENDENT_AMBULATORY_CARE_PROVIDER_SITE_OTHER): Payer: Medicare Other | Admitting: Pulmonary Disease

## 2018-01-06 VITALS — BP 104/48 | HR 60 | Ht 67.0 in | Wt 176.0 lb

## 2018-01-06 DIAGNOSIS — G4733 Obstructive sleep apnea (adult) (pediatric): Secondary | ICD-10-CM | POA: Diagnosis not present

## 2018-01-06 DIAGNOSIS — J432 Centrilobular emphysema: Secondary | ICD-10-CM | POA: Diagnosis not present

## 2018-01-06 DIAGNOSIS — Z79891 Long term (current) use of opiate analgesic: Secondary | ICD-10-CM | POA: Diagnosis not present

## 2018-01-06 DIAGNOSIS — M48062 Spinal stenosis, lumbar region with neurogenic claudication: Secondary | ICD-10-CM | POA: Diagnosis not present

## 2018-01-06 DIAGNOSIS — R269 Unspecified abnormalities of gait and mobility: Secondary | ICD-10-CM | POA: Diagnosis not present

## 2018-01-06 DIAGNOSIS — R600 Localized edema: Secondary | ICD-10-CM | POA: Diagnosis not present

## 2018-01-06 DIAGNOSIS — M546 Pain in thoracic spine: Secondary | ICD-10-CM | POA: Diagnosis not present

## 2018-01-06 DIAGNOSIS — G894 Chronic pain syndrome: Secondary | ICD-10-CM | POA: Diagnosis not present

## 2018-01-06 NOTE — Assessment & Plan Note (Signed)
  Continue Anoro Ellipta  >>> Take 1 puff daily in the morning right when you wake up >>>Rinse your mouth out after use >>>This is a daily maintenance inhaler, NOT a rescue inhaler >>>Contact our office if you are having difficulties affording or obtaining this medication >>>It is important for you to be able to take this daily and not miss any doses   Note your daily symptoms > remember "red flags" for COPD:   >>>Increase in cough >>>increase in sputum production >>>increase in shortness of breath or activity  intolerance.   If you notice these symptoms, please call the office to be seen.    Follow-up with our office in 2 to 3 months

## 2018-01-06 NOTE — Progress Notes (Addendum)
@Patient  ID: [name] [name], male    DOB: May 12, 1924, 84 y.o.   MRN: [id number]  Chief Complaint  Patient presents with  . Follow-up    OSA, COPD    Referring provider: [name], MD  HPI:  84 year old male followed in our office for COPD and obstructive sleep apnea currently managed on CPAP  PMH: A. fib, GERD, DVT, hypertension, prostate cancer Smoker/ Smoking History: Former Smoker.  Quit in 1975.  52.5-pack-year smoking history Maintenance: Anoro Ellipta Pt of: [place]  01/06/2018  - Visit   84 year old patient presenting today for follow-up visit.  Patient is presenting to the office to receive an order for a new CPAP.  Patient has had a CPAP for longer than 5 years.  Patient uses [facility].  Unfortunately patient did not bring his CPAP or SD card.  To the office.  So we are unable to obtain a record of CPAP compliance.  Patient reports he uses it regularly with no issues.  Patient would just like a new machine.  Patient also reports that he has been using his Anoro Ellipta regularly.  MMRC - Breathlessness Score 2 - on level ground, I walk slower than people of the same age because of breathlessness, or have to stop for breathe when walking to my own pace  Patient with chronic lower extremity swelling with compression stockings applied today.  Patient reports no changes in her shortness of breath since last office visit.  Patient denies orthopnea.  Patient denies cough.  Patient reports that he feels that he is at his baseline.   Tests:  Arlyce Harman 2015: FEV1 1.92 (63%), ratio 55  FENO:  No results found for: NITRICOXIDE  PFT: No flowsheet data found.  Imaging: Dg Elbow Complete Right  Result Date: 12/16/2017 CLINICAL DATA:  Recent fall with elbow pain, initial encounter EXAM: RIGHT ELBOW - COMPLETE 3+ VIEW COMPARISON:  09/24/2009 FINDINGS: No acute fracture or dislocation is seen. No soft tissue abnormality IMPRESSION: No acute abnormality noted.  Electronically Signed   By: [name] M.D.   On: 12/16/2017 21:35   Ct Head Wo Contrast  Result Date: 12/16/2017 CLINICAL DATA:  Recent fall with scalp hematoma, initial encounter EXAM: CT HEAD WITHOUT CONTRAST CT CERVICAL SPINE WITHOUT CONTRAST TECHNIQUE: Multidetector CT imaging of the head and cervical spine was performed following the standard protocol without intravenous contrast. Multiplanar CT image reconstructions of the cervical spine were also generated. COMPARISON:  11/22/2014 FINDINGS: CT HEAD FINDINGS Brain: Chronic atrophic changes and white matter ischemic changes are again seen and stable. No findings to suggest acute hemorrhage, acute infarction or space-occupying mass lesion are noted. Vascular: No hyperdense vessel or unexpected calcification. Skull: Normal. Negative for fracture or focal lesion. Sinuses/Orbits: No acute finding. Other: None. CT CERVICAL SPINE FINDINGS Alignment: Mild anterolisthesis of C7 on T1 is noted of a degenerative nature. Skull base and vertebrae: 7 cervical segments are well visualized. Vertebral body height is well maintained. Disc space narrowing is noted from C3-T1. Mild osteophytic changes are noted. Facet hypertrophic changes are seen. Again mild anterolisthesis of C7 with respect to T1 is noted of a degenerative nature. No acute fracture is seen. Soft tissues and spinal canal: No soft tissue abnormality is noted Upper chest: Negative. Other: None IMPRESSION: CT of the head: Chronic atrophic and ischemic changes without acute abnormality. CT of the cervical spine: Chronic degenerative anterolisthesis of C7 on T1 stable from a prior CT examination of 2018. No acute abnormality noted. Electronically  Signed   By: [name] M.D.   On: 12/16/2017 21:03   Ct Cervical Spine Wo Contrast  Result Date: 12/16/2017 CLINICAL DATA:  Recent fall with scalp hematoma, initial encounter EXAM: CT HEAD WITHOUT CONTRAST CT CERVICAL SPINE WITHOUT CONTRAST TECHNIQUE:  Multidetector CT imaging of the head and cervical spine was performed following the standard protocol without intravenous contrast. Multiplanar CT image reconstructions of the cervical spine were also generated. COMPARISON:  11/22/2014 FINDINGS: CT HEAD FINDINGS Brain: Chronic atrophic changes and white matter ischemic changes are again seen and stable. No findings to suggest acute hemorrhage, acute infarction or space-occupying mass lesion are noted. Vascular: No hyperdense vessel or unexpected calcification. Skull: Normal. Negative for fracture or focal lesion. Sinuses/Orbits: No acute finding. Other: None. CT CERVICAL SPINE FINDINGS Alignment: Mild anterolisthesis of C7 on T1 is noted of a degenerative nature. Skull base and vertebrae: 7 cervical segments are well visualized. Vertebral body height is well maintained. Disc space narrowing is noted from C3-T1. Mild osteophytic changes are noted. Facet hypertrophic changes are seen. Again mild anterolisthesis of C7 with respect to T1 is noted of a degenerative nature. No acute fracture is seen. Soft tissues and spinal canal: No soft tissue abnormality is noted Upper chest: Negative. Other: None IMPRESSION: CT of the head: Chronic atrophic and ischemic changes without acute abnormality. CT of the cervical spine: Chronic degenerative anterolisthesis of C7 on T1 stable from a prior CT examination of 2018. No acute abnormality noted. Electronically Signed   By: [name] M.D.   On: 12/16/2017 21:03    Chart Review:    Specialty Problems      Pulmonary Problems   COPD (chronic obstructive pulmonary disease) with emphysema ([place])    PFT's 2013:  FEV1 2.13 (85%), ratio 52, nl TLC, normal DLCO [place] 2014:  FEV1 1.63 (55%), ratio 51 No change with LABA/ICS S[place]ro 2015:  FEV1 1.92 (63%), ratio 55       DOE (dyspnea on exertion)   SOB (shortness of breath)   OSA (obstructive sleep apnea)    [facility]         No Known Allergies  Immunization History    Administered Date(s) Administered  . Influenza Split 11/02/2015  . Influenza, High Dose Seasonal PF 11/14/2016, 11/07/2017  . Influenza,inj,Quad PF,6+ Mos 10/16/2012  . Influenza-Unspecified 11/15/2013  . Pneumococcal Conjugate-13 06/03/2014  . Pneumococcal-Unspecified 05/04/2013  . Tdap 10/05/2012  . Zoster 02/16/2011  . Zoster Recombinat (Shingrix) 04/15/2017, 06/15/2017    Past Medical History:  Diagnosis Date  . Abnormality of gait    due to low back pain , uses  cane and walker  . Arthritis   . Bilateral lower extremity edema    wears TED hose  . Cancer [facility])    prostate- treated with radiation  . Cardiac pacemaker in situ 10/11/2006   medtronic  . Chronic iron deficiency anemia oncologist/ hematoloist-- dr Alen Blew   treatment IV Iron infusions  . Chronic low back pain   . CKD (chronic kidney disease), stage III ([place])   . COPD with emphysema [facility])    pulmologist-  dr Elsworth Soho  . DDD (degenerative disc disease), lumbosacral   . Degenerative scoliosis   . Dyspnea    with exertion  . Full dentures   . GERD (gastroesophageal reflux disease)   . Gross hematuria   . Headache    history of migraines- "way in the past"  . Hereditary and idiopathic peripheral neuropathy    bilateral lower leg  .  Hiatal hernia   . History of DVT of lower extremity 10/19/2011   chronic distal popliteal right lower extremity  . History of external beam radiation therapy 2008   prostate cancer  . History of pancreatitis 2013  . History of pneumothorax 09/2006   iatrogenic-- in setting cardiac pacemaker placement  . History of prostate cancer 2008   s/p  external radiation therapy  . Hypertension   . Lesion of bladder   . Macular degeneration of right eye   . Nocturia   . OSA on CPAP   . PAF (paroxysmal atrial fibrillation) ([place]) 10/2011   1st episode documented 09/ 2013 admission for gallstons/ pancreatitis and prior to cholecystectomy , went back in NSR without interventeion  . Presence  of permanent cardiac pacemaker   . Sinus node dysfunction ([facility])    s/p  cardiac pacemaker placement 10-11-2006  . Wears glasses   . Wears hearing aid in both ears     Tobacco History: Social History   Tobacco Use  Smoking Status Former Smoker  . Packs/day: 1.50  . Years: 35.00  . Pack years: 52.50  . Types: Cigarettes  . Last attempt to quit: 02/15/1973  . Years since quitting: 44.9  Smokeless Tobacco Never Used   Counseling given: Not Answered  Continues to not smoke  Outpatient Encounter Medications as of 01/06/2018  Medication Sig  . acetaminophen (TYLENOL) 650 MG CR tablet Take 1,300 mg by mouth 2 (two) times daily.   Marland Kitchen albuterol (PROVENTIL HFA;VENTOLIN HFA) 108 (90 Base) MCG/ACT inhaler Inhale 2 puffs into the lungs every 6 (six) hours as needed for wheezing or shortness of breath.  Marland Kitchen aspirin EC 81 MG tablet Take 81 mg by mouth at bedtime.   . Calcium Carbonate-Vitamin D (CALCIUM-D PO) Take 1 tablet by mouth every evening.   . denosumab (PROLIA) 60 MG/ML SOSY injection Inject 60 mg into the skin every 6 (six) months.  . diphenhydrAMINE (BENADRYL) 25 MG tablet Take 25 mg by mouth every 8 (eight) hours as needed.  . docusate sodium (COLACE) 100 MG capsule Take 100 mg by mouth daily as needed for mild constipation.   . furosemide (LASIX) 20 MG tablet Take 20-40 mg by mouth See admin instructions. Take 20mg  daily.  May take an additional 20mg  for leg swelling  . lisinopril (PRINIVIL,ZESTRIL) 10 MG tablet Take 10 mg by mouth every evening.  . Multiple Vitamins-Minerals (PRESERVISION AREDS PO) Take 1 tablet by mouth 2 (two) times daily.  Marland Kitchen oxybutynin (DITROPAN XL) 15 MG 24 hr tablet Take 15 mg by mouth once a week.   Marland Kitchen oxyCODONE (OXY IR/ROXICODONE) 5 MG immediate release tablet Take 5 mg by mouth every 6 (six) hours as needed for severe pain.  . pantoprazole (PROTONIX) 40 MG tablet Take 1 tablet (40 mg total) by mouth daily before breakfast.  . predniSONE (DELTASONE) 10 MG tablet  TAKE ONE TABLET BY MOUTH EVERY MORNING WITH BREAKFAST  . Umeclidinium-Vilanterol (ANORO ELLIPTA) 62.5-25 MCG/INH AEPB Inhale 1 puff into the lungs at bedtime.   . vitamin B-12 (CYANOCOBALAMIN) 500 MCG tablet Take 500 mcg by mouth at bedtime.    No facility-administered encounter medications on file as of 01/06/2018.      Review of Systems  Review of Systems  Constitutional: Positive for fatigue. Negative for activity change, chills, fever and unexpected weight change.  HENT: Negative for congestion, postnasal drip, rhinorrhea, sinus pressure, sneezing and sore throat.   Eyes: Negative.   Respiratory: Positive for shortness of breath (  worsened). Negative for cough and wheezing.        Denies orthopnea  Cardiovascular: Positive for leg swelling (wears compression stockings, at baseline). Negative for chest pain and palpitations.  Gastrointestinal: Negative for constipation, diarrhea, nausea and vomiting.  Endocrine: Negative.   Musculoskeletal: Positive for gait problem (2 falls at home, nov 1st - went to er, nov 8th - at home - went to er, uses walker).  Skin: Negative.   Neurological: Negative for dizziness and headaches.  Psychiatric/Behavioral: Negative.  Negative for dysphoric mood. The patient is not nervous/anxious.   All other systems reviewed and are negative.    Physical Exam  BP (!) 104/48 (BP Location: Left Arm, Cuff Size: Normal)   Pulse 60   Ht 5\' 7"  (1.702 m)   Wt 176 lb (79.8 kg)   SpO2 96%   BMI 27.57 kg/m   Wt Readings from Last 5 Encounters:  01/06/18 176 lb (79.8 kg)  12/30/17 173 lb (78.5 kg)  12/26/17 175 lb (79.4 kg)  12/01/17 175 lb (79.4 kg)  09/27/17 175 lb (79.4 kg)    Physical Exam  Constitutional: He is oriented to person, place, and time and well-developed, well-nourished, and in no distress. No distress.  HENT:  Head: Normocephalic and atraumatic.  Right Ear: Hearing and external ear normal.  Left Ear: Hearing and external ear normal.    Nose: Nose normal. Right sinus exhibits no maxillary sinus tenderness and no frontal sinus tenderness. Left sinus exhibits no maxillary sinus tenderness and no frontal sinus tenderness.  Mouth/Throat: Uvula is midline and oropharynx is clear and moist. No oropharyngeal exudate.  + Hearing aids bilaterally  Eyes: Pupils are equal, round, and reactive to light.  Neck: Normal range of motion. Neck supple. No JVD present.  Cardiovascular: Normal rate, regular rhythm and normal heart sounds.  Pulmonary/Chest: Effort normal and breath sounds normal. No accessory muscle usage. No respiratory distress. He has no decreased breath sounds. He has no wheezes. He has no rhonchi.  Abdominal: Soft. Bowel sounds are normal. There is no tenderness.  Musculoskeletal: Normal range of motion. He exhibits edema (2-3 plus lower extremity swelling bilaterally with compression stockings on).  Lymphadenopathy:    He has no cervical adenopathy.  Neurological: He is alert and oriented to person, place, and time.  Patient uses walker to ambulate  Skin: Skin is warm and dry. He is not diaphoretic. No erythema.  Psychiatric: Mood, memory, affect and judgment normal.  Nursing note and vitals reviewed.     Lab Results:  CBC    Component Value Date/Time   WBC 7.4 05/17/2017 1112   RBC 3.35 (L) 05/17/2017 1112   HGB 9.9 (L) 07/15/2017 0734   HGB 10.3 (L) 11/16/2016 1118   HCT 29.0 (L) 07/15/2017 0734   HCT 32.5 (L) 11/16/2016 1118   PLT 148 05/17/2017 1112   PLT 127 (L) 11/16/2016 1118   MCV 99.3 (H) 05/17/2017 1112   MCV 95.6 11/16/2016 1118   MCH 32.8 05/17/2017 1112   MCHC 33.0 05/17/2017 1112   RDW 15.1 (H) 05/17/2017 1112   RDW 13.7 11/16/2016 1118   LYMPHSABS 0.9 05/17/2017 1112   LYMPHSABS 1.4 11/16/2016 1118   MONOABS 0.4 05/17/2017 1112   MONOABS 0.3 11/16/2016 1118   EOSABS 0.3 05/17/2017 1112   EOSABS 0.2 11/16/2016 1118   BASOSABS 0.1 05/17/2017 1112   BASOSABS 0.0 11/16/2016 1118     BMET    Component Value Date/Time   NA 141 07/15/2017 0734  NA 143 05/18/2016 1254   K 4.0 07/15/2017 0734   K 4.1 05/18/2016 1254   CL 106 07/15/2017 0734   CO2 24 05/18/2016 1254   GLUCOSE 92 07/15/2017 0734   GLUCOSE 102 05/18/2016 1254   BUN 28 (H) 07/15/2017 0734   BUN 36.4 (H) 05/18/2016 1254   CREATININE 1.00 07/15/2017 0734   CREATININE 1.4 (H) 05/18/2016 1254   CALCIUM 9.3 05/18/2016 1254   GFRNONAA 59 (L) 03/31/2015 0357   GFRAA >60 03/31/2015 0357    BNP No results found for: BNP  ProBNP    Component Value Date/Time   PROBNP 1,315.0 (H) 10/23/2012 0419      Assessment & Plan:   Pleasant 84 year old male patient seen office today.  We will have patient bring CPAP for SD card to our office so we can obtain a download to show CPAP compliance.  Patient was also offered the opportunity to build to go to [facility] drop of his machine air in order for [place] to obtain a compliance report.  Patient says he prefers to bring his equipment here.  Patient will do this over the next week.  Once we have received this information then we can place the order for a new CPAP.  Patient to follow-up with our office in 3 months.  OSA (obstructive sleep apnea) Please drop off either your CPAP or the SD card from your CPAP so we can get a download in place an order for a new CPAP for you >>> If you do not bring the equipment by or the SD card for [place] to obtain a compliance report that we cannot place an order for a new CPAP for you  Please bring this to our office and either ask for a triage nurse or Lauren RN  We recommend that you continue using your CPAP daily >>>Keep up the hard work using your device >>> Goal should be wearing this for the entire night that you are sleeping, at least 4 to 6 hours  Remember:  . Do not drive or operate heavy machinery if tired or drowsy.  . Please notify the supply company and office if you are unable to use your device regularly due to  missing supplies or machine being broken.  . Work on maintaining a healthy weight and following your recommended nutrition plan  . Maintain proper daily exercise and movement  . Maintaining proper use of your device can also help improve management of other chronic illnesses such as: Blood pressure, blood sugars, and weight management.   BiPAP/ CPAP Cleaning:  >>>Clean weekly, with Dawn soap, and bottle brush.  Set up to air dry.  Follow-up with our office in 2 to 3 months  COPD (chronic obstructive pulmonary disease) with emphysema (HCC)  Continue Anoro Ellipta  >>> Take 1 puff daily in the morning right when you wake up >>>Rinse your mouth out after use >>>This is a daily maintenance inhaler, NOT a rescue inhaler >>>Contact our office if you are having difficulties affording or obtaining this medication >>>It is important for you to be able to take this daily and not miss any doses   Note your daily symptoms > remember "red flags" for COPD:   >>>Increase in cough >>>increase in sputum production >>>increase in shortness of breath or activity  intolerance.   If you notice these symptoms, please call the office to be seen.    Follow-up with our office in 2 to 3 months  Abnormality of gait Continue to use  her walker Continue fall precautions If you fall please present to the emergency room or contact primary care  Bilateral leg edema Continue lower extremity compression stockings  This appointment was 28 minutes along with over 50% of that time direct face-to-face patient care, assessment, plan of care follow-up and discussion.  Addendum: Patient presented to our office on 01/09/2018 to provide CPAP download.  CPAP compliance report confirms compliance of 29 out of the last 30 days, all 29 of those days greater than 4 hours, CPAP set pressure of 10, average usage 7 hours and 40 minutes  02/20/2018 - Addendum We will proceed forward with ordering a new CPAP same settings for  patient. Pt is benefiting from CPAP therapy.    [place], NP 01/06/2018

## 2018-01-06 NOTE — Patient Instructions (Signed)
Please drop off either your CPAP or the SD card from your CPAP so we can get a download in place an order for a new CPAP for you >>> If you do not bring the equipment by or the SD card for [place] to obtain a compliance report that we cannot place an order for a new CPAP for you  Please bring this to our office and either ask for a triage nurse or Lauren RN  We recommend that you continue using your CPAP daily >>>Keep up the hard work using your device >>> Goal should be wearing this for the entire night that you are sleeping, at least 4 to 6 hours  Remember:  . Do not drive or operate heavy machinery if tired or drowsy.  . Please notify the supply company and office if you are unable to use your device regularly due to missing supplies or machine being broken.  . Work on maintaining a healthy weight and following your recommended nutrition plan  . Maintain proper daily exercise and movement  . Maintaining proper use of your device can also help improve management of other chronic illnesses such as: Blood pressure, blood sugars, and weight management.   BiPAP/ CPAP Cleaning:  >>>Clean weekly, with Dawn soap, and bottle brush.  Set up to air dry.  Continue Anoro Ellipta  >>> Take 1 puff daily in the morning right when you wake up >>>Rinse your mouth out after use >>>This is a daily maintenance inhaler, NOT a rescue inhaler >>>Contact our office if you are having difficulties affording or obtaining this medication >>>It is important for you to be able to take this daily and not miss any doses   Follow-up with our office in 2 to 3 months  It is flu season:   >>>Remember to be washing your hands regularly, using hand sanitizer, be careful to use around herself with has contact with people who are sick will increase her chances of getting sick yourself. >>> Best ways to protect herself from the flu: Receive the yearly flu vaccine, practice good hand hygiene washing with soap and also using hand  sanitizer when available, eat a nutritious meals, get adequate rest, hydrate appropriately   Please contact the office if your symptoms worsen or you have concerns that you are not improving.   Thank you for choosing [facility] for your healthcare, and for allowing [place] to partner with you on your healthcare journey. I am thankful to be able to provide care to you today.   Wyn Quaker FNP-C

## 2018-01-06 NOTE — Assessment & Plan Note (Signed)
Continue lower extremity compression stockings

## 2018-01-06 NOTE — Assessment & Plan Note (Signed)
Please drop off either your CPAP or the SD card from your CPAP so we can get a download in place an order for a new CPAP for you >>> If you do not bring the equipment by or the SD card for [place] to obtain a compliance report that we cannot place an order for a new CPAP for you  Please bring this to our office and either ask for a triage nurse or Lauren RN  We recommend that you continue using your CPAP daily >>>Keep up the hard work using your device >>> Goal should be wearing this for the entire night that you are sleeping, at least 4 to 6 hours  Remember:  . Do not drive or operate heavy machinery if tired or drowsy.  . Please notify the supply company and office if you are unable to use your device regularly due to missing supplies or machine being broken.  . Work on maintaining a healthy weight and following your recommended nutrition plan  . Maintain proper daily exercise and movement  . Maintaining proper use of your device can also help improve management of other chronic illnesses such as: Blood pressure, blood sugars, and weight management.   BiPAP/ CPAP Cleaning:  >>>Clean weekly, with Dawn soap, and bottle brush.  Set up to air dry.  Follow-up with our office in 2 to 3 months

## 2018-01-06 NOTE — Assessment & Plan Note (Signed)
Continue to use her walker Continue fall precautions If you fall please present to the emergency room or contact primary care

## 2018-01-09 ENCOUNTER — Other Ambulatory Visit: Payer: Self-pay

## 2018-01-09 ENCOUNTER — Telehealth: Payer: Self-pay | Admitting: Pulmonary Disease

## 2018-01-09 NOTE — Patient Outreach (Signed)
[facility]) Care Management  01/09/2018  [name] [name] 1924-03-12 [id number]  Successful outreach to the patient on today's date, HIPAA identifiers confirmed. BSW introduced self to the patient and the reason for today's call, indicating this BSW wanted to follow up on recent referral to assist the patient in applying for aid and attendance. The patient declined this need stating "this is something my wife came up with". BSW briefly explained aid and attendance benefits to the patient. The patient is independent with ADL's and would not qualify. BSW discussed options to arrange a private duty caregiver to assist with housekeeping and meal preparation. The patient declines this BSW's assistance.  BSW to perform a case closure as the patient does not with to participate in services.  Daneen Schick, BSW, CDP [facility] [phone]

## 2018-01-09 NOTE — Telephone Encounter (Signed)
Spoke with pt, who advised me that he was told that his sd card was not fully in the cpap.  I advised pt that I was the one who discussed this with him, and that I'd make RA aware of the situation.  Pt expressed understanding.  Pt aware that we will call with cpap download results once RA reviews them.    RA please advise.  Thanks.

## 2018-01-09 NOTE — Telephone Encounter (Signed)
Patient calling regarding SD card that he brought in this am, states he needs to speak to nurse about this before RA reads the download, CB is [phone].

## 2018-01-09 NOTE — Telephone Encounter (Signed)
Download received and given to [place].  While speaking to pt, he noted that his machine has been giving him an error message stating that "something isn't plugged in right".  When I went to get his SD card out of his cpap, his SD card was not fully inserted to his cpap, which I suspect is the reason for the error message.  I instructed proper insertion of SD card with patient.   RA please advise on download.  Thanks!

## 2018-01-10 NOTE — Addendum Note (Signed)
Addended by: Lauraine Rinne on: 01/10/2018 09:12 AM   Modules accepted: Orders

## 2018-01-16 ENCOUNTER — Ambulatory Visit (INDEPENDENT_AMBULATORY_CARE_PROVIDER_SITE_OTHER): Payer: Medicare Other | Admitting: Physician Assistant

## 2018-01-16 ENCOUNTER — Encounter (INDEPENDENT_AMBULATORY_CARE_PROVIDER_SITE_OTHER): Payer: Self-pay | Admitting: Orthopedic Surgery

## 2018-01-16 VITALS — Ht 67.0 in | Wt 176.0 lb

## 2018-01-16 DIAGNOSIS — S51012D Laceration without foreign body of left elbow, subsequent encounter: Secondary | ICD-10-CM | POA: Diagnosis not present

## 2018-01-16 DIAGNOSIS — S51011D Laceration without foreign body of right elbow, subsequent encounter: Secondary | ICD-10-CM

## 2018-01-17 ENCOUNTER — Encounter: Payer: Self-pay | Admitting: Internal Medicine

## 2018-01-17 ENCOUNTER — Ambulatory Visit (INDEPENDENT_AMBULATORY_CARE_PROVIDER_SITE_OTHER): Payer: Medicare Other | Admitting: Internal Medicine

## 2018-01-17 VITALS — BP 130/74 | HR 66 | Ht 67.0 in | Wt 176.4 lb

## 2018-01-17 DIAGNOSIS — I480 Paroxysmal atrial fibrillation: Secondary | ICD-10-CM | POA: Diagnosis not present

## 2018-01-17 DIAGNOSIS — Z950 Presence of cardiac pacemaker: Secondary | ICD-10-CM

## 2018-01-17 DIAGNOSIS — I495 Sick sinus syndrome: Secondary | ICD-10-CM

## 2018-01-17 NOTE — H&P (View-Only) (Signed)
Patient Care Team: [name], MD as PCP - General (Family Medicine) Wyatt Portela, MD (Hematology and Oncology) Kathie Rhodes, MD as Attending Physician (Urology)   HPI  [name] [name] is a 84 y.o. male Seen to establish pacemaker followup. He underwent device implantation for sinus node dysfunction in the setting of chronic dizziness.    It also has a history of atrial fibrillation and hypertension. He is currently not on anticoagulation per Dr. Saundra Shelling.  He has been diagnosed with peripheral neuropathy.     Echocardiogram 4/14 demonstrated normal LV function left atrial enlargement Myoview scan 10/13 demonstrated an EF of 48-56% without evidence of reversible ischemia.   His device is reached ERI.  He did so about 2 months ago.  Has noted no change in functional status.  He walks with a walker.  Denies chest pain or edema.  No shortness of breath.  Past Medical History:  Diagnosis Date  . Abnormality of gait    due to low back pain , uses  cane and walker  . Arthritis   . Bilateral lower extremity edema    wears TED hose  . Cancer [facility])    prostate- treated with radiation  . Cardiac pacemaker in situ 10/11/2006   medtronic  . Chronic iron deficiency anemia oncologist/ hematoloist-- dr Alen Blew   treatment IV Iron infusions  . Chronic low back pain   . CKD (chronic kidney disease), stage III ([place])   . COPD with emphysema [facility])    pulmologist-  dr Elsworth Soho  . DDD (degenerative disc disease), lumbosacral   . Degenerative scoliosis   . Dyspnea    with exertion  . Full dentures   . GERD (gastroesophageal reflux disease)   . Gross hematuria   . Headache    history of migraines- "way in the past"  . Hereditary and idiopathic peripheral neuropathy    bilateral lower leg  . Hiatal hernia   . History of DVT of lower extremity 10/19/2011   chronic distal popliteal right lower extremity  . History of external beam radiation therapy 2008   prostate cancer  . History  of pancreatitis 2013  . History of pneumothorax 09/2006   iatrogenic-- in setting cardiac pacemaker placement  . History of prostate cancer 2008   s/p  external radiation therapy  . Hypertension   . Lesion of bladder   . Macular degeneration of right eye   . Nocturia   . OSA on CPAP   . PAF (paroxysmal atrial fibrillation) ([place]) 10/2011   1st episode documented 09/ 2013 admission for gallstons/ pancreatitis and prior to cholecystectomy , went back in NSR without interventeion  . Presence of permanent cardiac pacemaker   . Sinus node dysfunction (HCC)    s/p  cardiac pacemaker placement 10-11-2006  . Wears glasses   . Wears hearing aid in both ears       Current Outpatient Medications  Medication Sig Dispense Refill  . acetaminophen (TYLENOL) 650 MG CR tablet Take 1,300 mg by mouth 2 (two) times daily.     Marland Kitchen albuterol (PROVENTIL HFA;VENTOLIN HFA) 108 (90 Base) MCG/ACT inhaler Inhale 2 puffs into the lungs every 6 (six) hours as needed for wheezing or shortness of breath. 1 Inhaler 6  . aspirin EC 81 MG tablet Take 81 mg by mouth at bedtime.     . Calcium Carbonate-Vitamin D (CALCIUM-D PO) Take 1 tablet by mouth every evening.     . denosumab (PROLIA) 60  MG/ML SOSY injection Inject 60 mg into the skin every 6 (six) months.    . diphenhydrAMINE (BENADRYL) 25 MG tablet Take 25 mg by mouth every 8 (eight) hours as needed.    . docusate sodium (COLACE) 100 MG capsule Take 100 mg by mouth daily as needed for mild constipation.     . furosemide (LASIX) 20 MG tablet Take 20-40 mg by mouth See admin instructions. Take 20mg  daily.  May take an additional 20mg  for leg swelling    . lisinopril (PRINIVIL,ZESTRIL) 10 MG tablet Take 10 mg by mouth every evening.    . Multiple Vitamins-Minerals (PRESERVISION AREDS PO) Take 1 tablet by mouth 2 (two) times daily.    Marland Kitchen oxybutynin (DITROPAN XL) 15 MG 24 hr tablet Take 15 mg by mouth once a week.     Marland Kitchen oxyCODONE (OXY IR/ROXICODONE) 5 MG immediate release  tablet Take 5 mg by mouth every 6 (six) hours as needed for severe pain.    . pantoprazole (PROTONIX) 40 MG tablet Take 1 tablet (40 mg total) by mouth daily before breakfast. 90 tablet 1  . predniSONE (DELTASONE) 10 MG tablet TAKE ONE TABLET BY MOUTH EVERY MORNING WITH BREAKFAST 90 tablet 0  . Umeclidinium-Vilanterol (ANORO ELLIPTA) 62.5-25 MCG/INH AEPB Inhale 1 puff into the lungs at bedtime.     . vitamin B-12 (CYANOCOBALAMIN) 500 MCG tablet Take 500 mcg by mouth at bedtime.      No current facility-administered medications for this visit.     No Known Allergies  Review of Systems negative except from HPI and PMH  Physical Exam BP 130/74   Pulse 66   Ht 5\' 7"  (1.702 m)   Wt 176 lb 6.4 oz (80 kg)   SpO2 98%   BMI 27.63 kg/m  Well developed and nourished in no acute distress HENT normal Neck supple with JVP-flat Clear Device pocket well healed; without hematoma or erythema.  There is no tethering last Regular rate and rhythm, no murmurs or gallops Abd-soft with active BS No Clubbing cyanosis edema Skin-warm and dry A & Oriented  Grossly normal sensory and motor function walks w walker   ECG  Sinus with -with ventricular pacing and capture beats.   Assessment and  Plan  Sinus node dysfunction  Pacemaker  Medtronic  The patient's device was interrogated.  The information was reviewed. Changes were made as outlined below  Hypertension    PVCs   His device has reached ERI.  We have reviewed the benefits and risks of generator replacement.  These include but are not limited to lead fracture and infection.  The patient understands, agrees and is willing to proceed.    BP well controlled

## 2018-01-17 NOTE — Patient Instructions (Addendum)
Medication Instructions:  Your physician recommends that you continue on your current medications as directed. Please refer to the Current Medication list given to you today.  Labwork: Your physician recommends that you return for lab work after you have determined your change out date.   Testing/Procedures: Your physician has recommended that you have your pacemaker battery changed out. A pacemaker is a small device that is placed under the skin of your chest or abdomen to help control abnormal heart rhythms. This device uses electrical pulses to prompt the heart to beat at a normal rate. Pacemakers are used to treat heart rhythms that are too slow. Wire (leads) are attached to the pacemaker that goes into the chambers of you heart. This is done in the hospital and usually requires and overnight stay. Please see the instruction sheet given to you today for more information.   Follow-Up: Your physician recommends that you schedule a follow-up appointment in:   10-14 days after your battery change out for a wound check with our device clinic. 91 days after your battery change out for a device check with Dr Caryl Comes.   Any Other Special Instructions Will Be Listed Below (If Applicable).  Here is our availability for the month of December  Dec 9 Dec 20 Dec 23 Dec 27

## 2018-01-17 NOTE — Progress Notes (Signed)
Patient Care Team: [name], MD as PCP - General (Family Medicine) Wyatt Portela, MD (Hematology and Oncology) Kathie Rhodes, MD as Attending Physician (Urology)   HPI  [name] [name] is a 84 y.o. male Seen to establish pacemaker followup. He underwent device implantation for sinus node dysfunction in the setting of chronic dizziness.    It also has a history of atrial fibrillation and hypertension. He is currently not on anticoagulation per Dr. Saundra Shelling.  He has been diagnosed with peripheral neuropathy.     Echocardiogram 4/14 demonstrated normal LV function left atrial enlargement Myoview scan 10/13 demonstrated an EF of 48-56% without evidence of reversible ischemia.   His device is reached ERI.  He did so about 2 months ago.  Has noted no change in functional status.  He walks with a walker.  Denies chest pain or edema.  No shortness of breath.  Past Medical History:  Diagnosis Date  . Abnormality of gait    due to low back pain , uses  cane and walker  . Arthritis   . Bilateral lower extremity edema    wears TED hose  . Cancer [facility])    prostate- treated with radiation  . Cardiac pacemaker in situ 10/11/2006   medtronic  . Chronic iron deficiency anemia oncologist/ hematoloist-- dr Alen Blew   treatment IV Iron infusions  . Chronic low back pain   . CKD (chronic kidney disease), stage III ([place])   . COPD with emphysema [facility])    pulmologist-  dr Elsworth Soho  . DDD (degenerative disc disease), lumbosacral   . Degenerative scoliosis   . Dyspnea    with exertion  . Full dentures   . GERD (gastroesophageal reflux disease)   . Gross hematuria   . Headache    history of migraines- "way in the past"  . Hereditary and idiopathic peripheral neuropathy    bilateral lower leg  . Hiatal hernia   . History of DVT of lower extremity 10/19/2011   chronic distal popliteal right lower extremity  . History of external beam radiation therapy 2008   prostate cancer  . History  of pancreatitis 2013  . History of pneumothorax 09/2006   iatrogenic-- in setting cardiac pacemaker placement  . History of prostate cancer 2008   s/p  external radiation therapy  . Hypertension   . Lesion of bladder   . Macular degeneration of right eye   . Nocturia   . OSA on CPAP   . PAF (paroxysmal atrial fibrillation) ([place]) 10/2011   1st episode documented 09/ 2013 admission for gallstons/ pancreatitis and prior to cholecystectomy , went back in NSR without interventeion  . Presence of permanent cardiac pacemaker   . Sinus node dysfunction (HCC)    s/p  cardiac pacemaker placement 10-11-2006  . Wears glasses   . Wears hearing aid in both ears       Current Outpatient Medications  Medication Sig Dispense Refill  . acetaminophen (TYLENOL) 650 MG CR tablet Take 1,300 mg by mouth 2 (two) times daily.     Marland Kitchen albuterol (PROVENTIL HFA;VENTOLIN HFA) 108 (90 Base) MCG/ACT inhaler Inhale 2 puffs into the lungs every 6 (six) hours as needed for wheezing or shortness of breath. 1 Inhaler 6  . aspirin EC 81 MG tablet Take 81 mg by mouth at bedtime.     . Calcium Carbonate-Vitamin D (CALCIUM-D PO) Take 1 tablet by mouth every evening.     . denosumab (PROLIA) 60  MG/ML SOSY injection Inject 60 mg into the skin every 6 (six) months.    . diphenhydrAMINE (BENADRYL) 25 MG tablet Take 25 mg by mouth every 8 (eight) hours as needed.    . docusate sodium (COLACE) 100 MG capsule Take 100 mg by mouth daily as needed for mild constipation.     . furosemide (LASIX) 20 MG tablet Take 20-40 mg by mouth See admin instructions. Take 20mg  daily.  May take an additional 20mg  for leg swelling    . lisinopril (PRINIVIL,ZESTRIL) 10 MG tablet Take 10 mg by mouth every evening.    . Multiple Vitamins-Minerals (PRESERVISION AREDS PO) Take 1 tablet by mouth 2 (two) times daily.    Marland Kitchen oxybutynin (DITROPAN XL) 15 MG 24 hr tablet Take 15 mg by mouth once a week.     Marland Kitchen oxyCODONE (OXY IR/ROXICODONE) 5 MG immediate release  tablet Take 5 mg by mouth every 6 (six) hours as needed for severe pain.    . pantoprazole (PROTONIX) 40 MG tablet Take 1 tablet (40 mg total) by mouth daily before breakfast. 90 tablet 1  . predniSONE (DELTASONE) 10 MG tablet TAKE ONE TABLET BY MOUTH EVERY MORNING WITH BREAKFAST 90 tablet 0  . Umeclidinium-Vilanterol (ANORO ELLIPTA) 62.5-25 MCG/INH AEPB Inhale 1 puff into the lungs at bedtime.     . vitamin B-12 (CYANOCOBALAMIN) 500 MCG tablet Take 500 mcg by mouth at bedtime.      No current facility-administered medications for this visit.     No Known Allergies  Review of Systems negative except from HPI and PMH  Physical Exam BP 130/74   Pulse 66   Ht 5\' 7"  (1.702 m)   Wt 176 lb 6.4 oz (80 kg)   SpO2 98%   BMI 27.63 kg/m  Well developed and nourished in no acute distress HENT normal Neck supple with JVP-flat Clear Device pocket well healed; without hematoma or erythema.  There is no tethering last Regular rate and rhythm, no murmurs or gallops Abd-soft with active BS No Clubbing cyanosis edema Skin-warm and dry A & Oriented  Grossly normal sensory and motor function walks w walker   ECG  Sinus with -with ventricular pacing and capture beats.   Assessment and  Plan  Sinus node dysfunction  Pacemaker  Medtronic  The patient's device was interrogated.  The information was reviewed. Changes were made as outlined below  Hypertension    PVCs   His device has reached ERI.  We have reviewed the benefits and risks of generator replacement.  These include but are not limited to lead fracture and infection.  The patient understands, agrees and is willing to proceed.    BP well controlled

## 2018-01-17 NOTE — Telephone Encounter (Signed)
[name] returns to the office on 01/23/18. Message will addressed at that time.

## 2018-01-18 ENCOUNTER — Encounter (INDEPENDENT_AMBULATORY_CARE_PROVIDER_SITE_OTHER): Payer: Self-pay | Admitting: Physician Assistant

## 2018-01-18 ENCOUNTER — Telehealth: Payer: Self-pay | Admitting: Internal Medicine

## 2018-01-18 NOTE — Telephone Encounter (Signed)
Patient called again, he is anxious to set this up.  States he has things he needs to plan.

## 2018-01-18 NOTE — Telephone Encounter (Signed)
Pt is scheduled for 12/27 change out. I will call pt in the morning to review pre procedural instructions.

## 2018-01-18 NOTE — Progress Notes (Signed)
Office Visit Note   Patient: [name] [name]           Date of Birth: Jul 14, 1924           MRN: [id number] Visit Date: 01/16/2018              Requested by: [name], MD [place], [place] [address] PCP: [name], MD  Chief Complaint  Patient presents with  . Left Elbow - Follow-up  . Right Elbow - Follow-up      HPI: The patient is a 84 year old gentleman who presents for follow-up of his elbow soft tissue injuries.  He he had a laceration to the right elbow and this has healed.  He had a skin avulsion tear to the left elbow and this has also healed.  He utilized a silver arm compression sleeve for both of the injuries as treatment and the healed extremely well and he is very pleased with the result.  Assessment & Plan: Visit Diagnoses:  1. Skin tear of elbow without complication, left, subsequent encounter   2. Laceration of right elbow, subsequent encounter     Plan: Bilateral elbows are healing well.  Patient was counseled to use the silver arm compression stockings as needed.  He will follow-up here on an as-needed basis.  Follow-Up Instructions: Return if symptoms worsen or fail to improve.   Ortho Exam  Patient is alert, oriented, no adenopathy, well-dressed, normal affect, normal respiratory effort. Bilateral elbows are healing well.  The right elbow laceration site has minimal scar.  The patient has good motion.  Left elbow skin tear is healing well with minimal scabbing.  There are no signs of cellulitis or infection in either area.  Imaging: No results found. No images are attached to the encounter.  Labs: Lab Results  Component Value Date   CRP 16.2 (H) 10/20/2011   LABURIC 4.7 10/17/2006   REPTSTATUS 03/31/2015 FINAL 03/30/2015   GRAMSTAIN Abundant 12/18/2015   GRAMSTAIN WBC present-predominately PMN 12/18/2015   GRAMSTAIN No Squamous Epithelial Cells Seen 12/18/2015   GRAMSTAIN No Organisms Seen 12/18/2015   CULT 9,000  COLONIES/mL INSIGNIFICANT GROWTH 03/30/2015   LABORGA NORMAL SKIN FLORA 12/18/2015     Lab Results  Component Value Date   ALBUMIN 4.1 05/18/2016   ALBUMIN 3.2 (L) 10/17/2012   ALBUMIN 1.9 (L) 10/24/2011   LABURIC 4.7 10/17/2006    Body mass index is 27.57 kg/m.  Orders:  No orders of the defined types were placed in this encounter.  No orders of the defined types were placed in this encounter.    Procedures: No procedures performed  Clinical Data: No additional findings.  ROS:  All other systems negative, except as noted in the HPI. Review of Systems  Objective: Vital Signs: Ht 5\' 7"  (1.702 m)   Wt 176 lb (79.8 kg)   BMI 27.57 kg/m   Specialty Comments:  No specialty comments available.  PMFS History: Patient Active Problem List   Diagnosis Date Noted  . PCO (posterior capsular opacification), bilateral 05/26/2017  . Weakness of right leg 04/20/2017  . 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 Exudative age-related macular degeneration of right eye with active choroidal neovascularization ([place]) 09/30/2016  . Pseudophakia of both eyes 09/30/2016  . Impingement syndrome of right shoulder 06/16/2016  . Impingement syndrome of left shoulder 06/16/2016  . Bilateral leg edema 06/14/2016  . Dizziness and giddiness 06/26/2015  . Abnormality of  gait 06/26/2015  . Anxiety 04/14/2015  . GERD (gastroesophageal reflux disease) 04/03/2015  . OSA (obstructive sleep apnea) 04/03/2015  . HLD (hyperlipidemia) 04/03/2015  . SOB (shortness of breath)   . Left elbow fracture 03/29/2015  . Dizziness 01/01/2015  . LBP (low back pain) 01/01/2015  . Malignant neoplasm of prostate ([place]) 01/01/2015  . BP (high blood pressure) 01/01/2015  . DOE (dyspnea on exertion) 10/26/2013  . Essential hypertension, benign 10/26/2013  . Post-traumatic wound infection 10/17/2012  . Cellulitis 10/17/2012  . Atrial fibrillation ([place]) 10/22/2011  .  DVT, lower extremity, distal, chronic ([place]) 10/22/2011  . Pacemaker 10/19/2011  . Prostate cancer ([place]) 10/19/2011  . COPD (chronic obstructive pulmonary disease) with emphysema ([place]) 02/04/2011  . Anemia 12/10/2010   Past Medical History:  Diagnosis Date  . Abnormality of gait    due to low back pain , uses  cane and walker  . Arthritis   . Bilateral lower extremity edema    wears TED hose  . Cancer [facility])    prostate- treated with radiation  . Cardiac pacemaker in situ 10/11/2006   medtronic  . Chronic iron deficiency anemia oncologist/ hematoloist-- dr Alen Blew   treatment IV Iron infusions  . Chronic low back pain   . CKD (chronic kidney disease), stage III ([place])   . COPD with emphysema [facility])    pulmologist-  dr Elsworth Soho  . DDD (degenerative disc disease), lumbosacral   . Degenerative scoliosis   . Dyspnea    with exertion  . Full dentures   . GERD (gastroesophageal reflux disease)   . Gross hematuria   . Headache    history of migraines- "way in the past"  . Hereditary and idiopathic peripheral neuropathy    bilateral lower leg  . Hiatal hernia   . History of DVT of lower extremity 10/19/2011   chronic distal popliteal right lower extremity  . History of external beam radiation therapy 2008   prostate cancer  . History of pancreatitis 2013  . History of pneumothorax 09/2006   iatrogenic-- in setting cardiac pacemaker placement  . History of prostate cancer 2008   s/p  external radiation therapy  . Hypertension   . Lesion of bladder   . Macular degeneration of right eye   . Nocturia   . OSA on CPAP   . PAF (paroxysmal atrial fibrillation) ([place]) 10/2011   1st episode documented 09/ 2013 admission for gallstons/ pancreatitis and prior to cholecystectomy , went back in NSR without interventeion  . Presence of permanent cardiac pacemaker   . Sinus node dysfunction (HCC)    s/p  cardiac pacemaker placement 10-11-2006  . Wears glasses   . Wears hearing aid in both ears       Family History  Problem Relation Age of Onset  . Heart disease Father   . Heart disease Mother   . Colon cancer Mother   . Stroke Mother   . Cancer Sister   . Heart disease Sister   . Heart attack Neg Hx   . Hypertension Neg Hx     Past Surgical History:  Procedure Laterality Date  . BALLOON DILATION N/A 01/14/2015   Procedure: BALLOON DILATION;  Surgeon: Garlan Fair, MD;  Location: [facility];  Service: Endoscopy;  Laterality: N/A;  . BALLOON DILATION N/A 10/12/2017   Procedure: BALLOON DILATION;  Surgeon: Otis Brace, MD;  Location: [facility];  Service: Gastroenterology;  Laterality: N/A;  . BIOPSY  10/12/2017   Procedure: BIOPSY;  Surgeon:  Otis Brace, MD;  Location: [facility];  Service: Gastroenterology;;  . CARDIAC CATHETERIZATION  08-29-2000   [place]   no significant obstructive CAD, intact globle LV size and systolic function with regional wall motion abnormalities noted,  ?coronary spasm producing wall motion abnormality, elevated CPK and chest pain (ef 55%)  . CARDIAC PACEMAKER PLACEMENT  10-11-2006    dr Leonia Reeves   W/  ATRIAL LEAD REVISION 10-12-2006   (medtronic)  . CARDIOVASCULAR STRESS TEST  11-18-2011    dr Irish Lack   Low risk nuclear study w/ no ishemia/  normal wall motion,  post-stress ef 48% (LVEF 56% on prior study 2008)  . CARPAL TUNNEL RELEASE Left 11/14/2014   Procedure: CARPAL TUNNEL RELEASE LEFT THUMB;  Surgeon: Daryll Brod, MD;  Location: [facility];  Service: Orthopedics;  Laterality: Left;  ANESTHESIA:  IV REGIONAL FAB  . CATARACT EXTRACTION W/ INTRAOCULAR LENS  IMPLANT, BILATERAL    . CHOLECYSTECTOMY  10/23/2011  . CHOLECYSTECTOMY  10/23/2011   Procedure: CHOLECYSTECTOMY;  Surgeon: Rolm Bookbinder, MD;  Location: [facility];  Service: General;  Laterality: N/A;  with intraoperative cholangiogram  . COLONOSCOPY    . ESOPHAGOGASTRODUODENOSCOPY (EGD) WITH PROPOFOL N/A 01/14/2015   Procedure: ESOPHAGOGASTRODUODENOSCOPY (EGD) WITH  PROPOFOL;  Surgeon: Garlan Fair, MD;  Location: [facility];  Service: Endoscopy;  Laterality: N/A;  . ESOPHAGOGASTRODUODENOSCOPY (EGD) WITH PROPOFOL N/A 10/12/2017   Procedure: ESOPHAGOGASTRODUODENOSCOPY (EGD) WITH PROPOFOL;  Surgeon: Otis Brace, MD;  Location: [facility];  Service: Gastroenterology;  Laterality: N/A;  . I&D EXTREMITY Left 10/20/2012   Procedure: LEFT LEG IRRIGATION AND DEBRIDEMENT, AND WOUND VAC APPLICATION;  Surgeon: Marianna Payment, MD;  Location: [facility];  Service: Orthopedics;  Laterality: Left;  . I&D EXTREMITY Left 10/22/2012   Procedure: IRRIGATION AND DEBRIDEMENT EXTREMITY;  Surgeon: Marianna Payment, MD;  [facility];  Service: Orthopedics;  Laterality: Left;  . INGUINAL HERNIA REPAIR Left yrs ago  . LAPAROSCOPIC ASSISTED VENTRAL HERNIA REPAIR  08-08-2009   dr Excell Seltzer   AND OPEN REPAIR RECURRENT LEFT INGUINAL HERNIA  . LAPAROSCOPIC INGUINAL HERNIA REPAIR Left 07-24-1999    dr Excell Seltzer  . OPEN VENTRAL HERNIA REPAIR /  LYSIS ADHESIONS  09-23-2010    dr Donne Hazel  . ORIF ELBOW FRACTURE Left 03/29/2015   Procedure: LEFT ELBOW OPEN REDUCTION INTERNAL FIXATION (ORIF) DISTAL HUMERUS FRACTURE WITH OLECRANON OSTEOTOMY AND ULNAR NERVE RELEASE;  Surgeon: Roseanne Kaufman, MD;  Location: [facility];  Service: Orthopedics;  Laterality: Left;  . REPAIR SUPRAUMBILICAL HERNIA   [phone]   dr Excell Seltzer  . SHOULDER ARTHROSCOPY WITH DISTAL CLAVICLE RESECTION Right 11-07-2007   dr Rhona Raider   [place] ACROMIOPLASTY  . SKIN SPLIT GRAFT Left 10/22/2012   Procedure: SKIN GRAFT SPLIT THICKNESS;  Surgeon: Marianna Payment, MD;  Location: [facility];  Service: Orthopedics;  Laterality: Left;  . TOTAL KNEE ARTHROPLASTY Right 1990's  . TRANSTHORACIC ECHOCARDIOGRAM  06-12-2012   dr Irish Lack   ef 50-55%/ mild LAE/ mild MR and TR/  AV sclerosis without stenosis/  RVSP 110mmHg  . TRANSURETHRAL RESECTION OF BLADDER TUMOR N/A 07/15/2017   Procedure: CYSTOSCOPY TRANSURETHRAL  RESECTION OF BLADDER TUMOR (TURBT);  Surgeon: Kathie Rhodes, MD;  [facility];  Service: Urology;  Laterality: N/A;   Social History   Occupational History  . 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 Smoking status: Former Smoker    Packs/day:  1.50    Years: 35.00    Pack years: 52.50    Types: Cigarettes    Last attempt to quit: 02/15/1973    Years since quitting: 44.9  . Smokeless tobacco: Never Used  Substance and Sexual Activity  . Alcohol use: No  . Drug use: No  . Sexual activity: Never

## 2018-01-18 NOTE — Telephone Encounter (Signed)
New message   Patient is needing to schedule an appointment to have batteries changed in pacemaker. Please call.

## 2018-01-20 NOTE — Telephone Encounter (Signed)
Spoke with pt and he is aware of pre procedural instructions. A letter has been sent to him via [employer]. Pt will call with any additional questions.

## 2018-01-23 NOTE — Telephone Encounter (Signed)
dont have this download

## 2018-01-24 NOTE — Telephone Encounter (Signed)
[name] [name] please advise if you have this download. Thanks.

## 2018-01-24 NOTE — Telephone Encounter (Signed)
Download was given to [facility] since he was the one who saw the patient for a replacement cpap machine. Order has already been placed but I will print another download and place in your look-at folder with the order Aaron Edelman placed.

## 2018-01-25 ENCOUNTER — Other Ambulatory Visit (INDEPENDENT_AMBULATORY_CARE_PROVIDER_SITE_OTHER): Payer: Self-pay | Admitting: Orthopedic Surgery

## 2018-01-25 NOTE — Telephone Encounter (Signed)
Do you want to approve this?

## 2018-01-25 NOTE — Telephone Encounter (Signed)
Thanks for working on this. Order has already been placed. CPAP report was reviewed.   Wyn Quaker FNP

## 2018-01-26 ENCOUNTER — Encounter (INDEPENDENT_AMBULATORY_CARE_PROVIDER_SITE_OTHER): Payer: Self-pay | Admitting: Orthopedic Surgery

## 2018-01-26 ENCOUNTER — Ambulatory Visit (INDEPENDENT_AMBULATORY_CARE_PROVIDER_SITE_OTHER): Payer: Medicare Other | Admitting: Orthopedic Surgery

## 2018-01-26 VITALS — Ht 67.0 in | Wt 176.0 lb

## 2018-01-26 DIAGNOSIS — S51011D Laceration without foreign body of right elbow, subsequent encounter: Secondary | ICD-10-CM

## 2018-01-26 DIAGNOSIS — L97919 Non-pressure chronic ulcer of unspecified part of right lower leg with unspecified severity: Secondary | ICD-10-CM

## 2018-01-26 DIAGNOSIS — I87333 Chronic venous hypertension (idiopathic) with ulcer and inflammation of bilateral lower extremity: Secondary | ICD-10-CM

## 2018-01-26 DIAGNOSIS — L97929 Non-pressure chronic ulcer of unspecified part of left lower leg with unspecified severity: Secondary | ICD-10-CM | POA: Diagnosis not present

## 2018-01-26 DIAGNOSIS — B351 Tinea unguium: Secondary | ICD-10-CM

## 2018-01-26 NOTE — Progress Notes (Signed)
Office Visit Note   Patient: [name] [name]           Date of Birth: 13-Sep-1924           MRN: [id number] Visit Date: 01/26/2018              Requested by: [name], MD [place], [place] [address] PCP: [name], MD  Chief Complaint  Patient presents with  . Right Leg - Follow-up  . Left Leg - Follow-up      HPI: Patient is a 84 year old gentleman who presents with ulcerations and weeping edema in both legs from venous insufficiency.  Patient states he has compression stockings but states he cannot get them on.  He states that he is too busy during the day to elevate his legs.  He complains of persistent right elbow pain as well.  Assessment & Plan: Visit Diagnoses:  1. Laceration of right elbow, subsequent encounter   2. Idiopathic chronic venous hypertension of both lower extremities with ulcer and inflammation ([place])   3. Onychomycosis     Plan: We will wrap both legs with a Dynaflex wrap and follow-up in 1 week.  Follow-Up Instructions: Return in about 1 week (around 02/02/2018).   Ortho Exam  Patient is alert, oriented, no adenopathy, well-dressed, normal affect, normal respiratory effort. Examination patient ambulates with a walker he is unsteady with his gait.  He has weeping edema with both legs pitting edema he has good arterial inflow with palpable pulses there are venous stasis ulcers in both legs due to the edema.  He does have onychomycotic nails x9 he is unable to safely trim the nails on his own and nails were trimmed x9 without complication peer examination the right elbow he does have some pain to palpation of the skin is well-healed his radiographs were reviewed which shows no fractures or osteoarthritis to explain the elbow pain.  Imaging: No results found. No images are attached to the encounter.  Labs: Lab Results  Component Value Date   CRP 16.2 (H) 10/20/2011   LABURIC 4.7 10/17/2006   REPTSTATUS 03/31/2015 FINAL  03/30/2015   GRAMSTAIN Abundant 12/18/2015   GRAMSTAIN WBC present-predominately PMN 12/18/2015   GRAMSTAIN No Squamous Epithelial Cells Seen 12/18/2015   GRAMSTAIN No Organisms Seen 12/18/2015   CULT 9,000 COLONIES/mL INSIGNIFICANT GROWTH 03/30/2015   LABORGA NORMAL SKIN FLORA 12/18/2015     Lab Results  Component Value Date   ALBUMIN 4.1 05/18/2016   ALBUMIN 3.2 (L) 10/17/2012   ALBUMIN 1.9 (L) 10/24/2011   LABURIC 4.7 10/17/2006    Body mass index is 27.57 kg/m.  Orders:  No orders of the defined types were placed in this encounter.  No orders of the defined types were placed in this encounter.    Procedures: No procedures performed  Clinical Data: No additional findings.  ROS:  All other systems negative, except as noted in the HPI. Review of Systems  Objective: Vital Signs: Ht 5\' 7"  (1.702 m)   Wt 176 lb (79.8 kg)   BMI 27.57 kg/m   Specialty Comments:  No specialty comments available.  PMFS History: Patient Active Problem List   Diagnosis Date Noted  . PCO (posterior capsular opacification), bilateral 05/26/2017  . Weakness of right leg 04/20/2017  . 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 Exudative age-related macular degeneration of right eye with active choroidal neovascularization ([place]) 09/30/2016  . Pseudophakia of both eyes  09/30/2016  . Impingement syndrome of right shoulder 06/16/2016  . Impingement syndrome of left shoulder 06/16/2016  . Bilateral leg edema 06/14/2016  . Dizziness and giddiness 06/26/2015  . Abnormality of gait 06/26/2015  . Anxiety 04/14/2015  . GERD (gastroesophageal reflux disease) 04/03/2015  . OSA (obstructive sleep apnea) 04/03/2015  . HLD (hyperlipidemia) 04/03/2015  . SOB (shortness of breath)   . Left elbow fracture 03/29/2015  . Dizziness 01/01/2015  . LBP (low back pain) 01/01/2015  . Malignant neoplasm of prostate ([place]) 01/01/2015  . BP (high blood pressure)  01/01/2015  . DOE (dyspnea on exertion) 10/26/2013  . Essential hypertension, benign 10/26/2013  . Post-traumatic wound infection 10/17/2012  . Cellulitis 10/17/2012  . Atrial fibrillation ([place]) 10/22/2011  . DVT, lower extremity, distal, chronic ([place]) 10/22/2011  . Pacemaker 10/19/2011  . Prostate cancer ([place]) 10/19/2011  . COPD (chronic obstructive pulmonary disease) with emphysema ([place]) 02/04/2011  . Anemia 12/10/2010   Past Medical History:  Diagnosis Date  . Abnormality of gait    due to low back pain , uses  cane and walker  . Arthritis   . Bilateral lower extremity edema    wears TED hose  . Cancer [facility])    prostate- treated with radiation  . Cardiac pacemaker in situ 10/11/2006   medtronic  . Chronic iron deficiency anemia oncologist/ hematoloist-- dr Alen Blew   treatment IV Iron infusions  . Chronic low back pain   . CKD (chronic kidney disease), stage III ([place])   . COPD with emphysema [facility])    pulmologist-  dr Elsworth Soho  . DDD (degenerative disc disease), lumbosacral   . Degenerative scoliosis   . Dyspnea    with exertion  . Full dentures   . GERD (gastroesophageal reflux disease)   . Gross hematuria   . Headache    history of migraines- "way in the past"  . Hereditary and idiopathic peripheral neuropathy    bilateral lower leg  . Hiatal hernia   . History of DVT of lower extremity 10/19/2011   chronic distal popliteal right lower extremity  . History of external beam radiation therapy 2008   prostate cancer  . History of pancreatitis 2013  . History of pneumothorax 09/2006   iatrogenic-- in setting cardiac pacemaker placement  . History of prostate cancer 2008   s/p  external radiation therapy  . Hypertension   . Lesion of bladder   . Macular degeneration of right eye   . Nocturia   . OSA on CPAP   . PAF (paroxysmal atrial fibrillation) ([place]) 10/2011   1st episode documented 09/ 2013 admission for gallstons/ pancreatitis and prior to cholecystectomy , went  back in NSR without interventeion  . Presence of permanent cardiac pacemaker   . Sinus node dysfunction (H[facility])    s/p  cardiac pacemaker placement 10-11-2006  . Wears glasses   . Wears hearing aid in both ears     Family History  Problem Relation Age of Onset  . Heart disease Father   . Heart disease Mother   . Colon cancer Mother   . Stroke Mother   . Cancer Sister   . Heart disease Sister   . Heart attack Neg Hx   . Hypertension Neg Hx     Past Surgical History:  Procedure Laterality Date  . BALLOON DILATION N/A 01/14/2015   Procedure: BALLOON DILATION;  Surgeon: Garlan Fair, MD;  Location: [facility];  Service: Endoscopy;  Laterality: N/A;  . BALLOON DILATION N/A  10/12/2017   Procedure: BALLOON DILATION;  Surgeon: Otis Brace, MD;  Location: [place] [place] [facility];  Service: Gastroenterology;  Laterality: N/A;  . BIOPSY  10/12/2017   Procedure: BIOPSY;  Surgeon: Otis Brace, MD;  Location: [place] [facility];  Service: Gastroenterology;;  . CARDIAC CATHETERIZATION  08-29-2000   [facility]   no significant obstructive CAD, intact globle LV size and systolic function with regional wall motion abnormalities noted,  ?coronary spasm producing wall motion abnormality, elevated CPK and chest pain (ef 55%)  . CARDIAC PACEMAKER PLACEMENT  10-11-2006    dr Leonia Reeves   W/  ATRIAL LEAD REVISION 10-12-2006   (medtronic)  . CARDIOVASCULAR STRESS TEST  11-18-2011    dr Irish Lack   Low risk nuclear study w/ no ishemia/  normal wall motion,  post-stress ef 48% (LVEF 56% on prior study 2008)  . CARPAL TUNNEL RELEASE Left 11/14/2014   Procedure: CARPAL TUNNEL RELEASE LEFT THUMB;  Surgeon: Daryll Brod, MD;  Location: [place];  Service: Orthopedics;  Laterality: Left;  ANESTHESIA:  IV REGIONAL FAB  . CATARACT EXTRACTION W/ INTRAOCULAR LENS  IMPLANT, BILATERAL    . CHOLECYSTECTOMY  10/23/2011  . CHOLECYSTECTOMY  10/23/2011   Procedure: CHOLECYSTECTOMY;  Surgeon: Rolm Bookbinder, MD;  Location:  [facility];  Service: General;  Laterality: N/A;  with intraoperative cholangiogram  . COLONOSCOPY    . ESOPHAGOGASTRODUODENOSCOPY (EGD) WITH PROPOFOL N/A 01/14/2015   Procedure: ESOPHAGOGASTRODUODENOSCOPY (EGD) WITH PROPOFOL;  Surgeon: Garlan Fair, MD;  [facility];  Service: Endoscopy;  Laterality: N/A;  . ESOPHAGOGASTRODUODENOSCOPY (EGD) WITH PROPOFOL N/A 10/12/2017   Procedure: ESOPHAGOGASTRODUODENOSCOPY (EGD) WITH PROPOFOL;  Surgeon: Otis Brace, MD;  Location: [facility];  Service: Gastroenterology;  Laterality: N/A;  . I&D EXTREMITY Left 10/20/2012   Procedure: LEFT LEG IRRIGATION AND DEBRIDEMENT, AND WOUND VAC APPLICATION;  Surgeon: Marianna Payment, MD;  [facility];  Service: Orthopedics;  Laterality: Left;  . I&D EXTREMITY Left 10/22/2012   Procedure: IRRIGATION AND DEBRIDEMENT EXTREMITY;  Surgeon: Marianna Payment, MD;  [facility];  Service: Orthopedics;  Laterality: Left;  . INGUINAL HERNIA REPAIR Left yrs ago  . LAPAROSCOPIC ASSISTED VENTRAL HERNIA REPAIR  08-08-2009   dr Excell Seltzer   AND OPEN REPAIR RECURRENT LEFT INGUINAL HERNIA  . LAPAROSCOPIC INGUINAL HERNIA REPAIR Left 07-24-1999    dr Excell Seltzer  . OPEN VENTRAL HERNIA REPAIR /  LYSIS ADHESIONS  09-23-2010    dr Donne Hazel  . ORIF ELBOW FRACTURE Left 03/29/2015   Procedure: LEFT ELBOW OPEN REDUCTION INTERNAL FIXATION (ORIF) DISTAL HUMERUS FRACTURE WITH OLECRANON OSTEOTOMY AND ULNAR NERVE RELEASE;  Surgeon: Roseanne Kaufman, MD;  Location: [place];  Service: Orthopedics;  Laterality: Left;  . REPAIR SUPRAUMBILICAL HERNIA   [phone]   dr Excell Seltzer  . SHOULDER ARTHROSCOPY WITH DISTAL CLAVICLE RESECTION Right 11-07-2007   dr Rhona Raider   [place] ACROMIOPLASTY  . SKIN SPLIT GRAFT Left 10/22/2012   Procedure: SKIN GRAFT SPLIT THICKNESS;  Surgeon: Marianna Payment, MD;  [facility];  Service: Orthopedics;  Laterality: Left;  . TOTAL KNEE ARTHROPLASTY Right 1990's  . TRANSTHORACIC  ECHOCARDIOGRAM  06-12-2012   dr Irish Lack   ef 50-55%/ mild LAE/ mild MR and TR/  AV sclerosis without stenosis/  RVSP 77mmHg  . TRANSURETHRAL RESECTION OF BLADDER TUMOR N/A 07/15/2017   Procedure: CYSTOSCOPY TRANSURETHRAL RESECTION OF BLADDER TUMOR (TURBT);  Surgeon: Kathie Rhodes, MD;  Location: [facility];  Service: Urology;  Laterality: N/A;   Social History  Occupational History  . 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 Smoking status: Former Smoker    Packs/day: 1.50    Years: 35.00    Pack years: 52.50    Types: Cigarettes    Last attempt to quit: 02/15/1973    Years since quitting: 44.9  . Smokeless tobacco: Never Used  Substance and Sexual Activity  . Alcohol use: No  . Drug use: No  . Sexual activity: Never

## 2018-01-27 ENCOUNTER — Telehealth (INDEPENDENT_AMBULATORY_CARE_PROVIDER_SITE_OTHER): Payer: Self-pay | Admitting: Orthopedic Surgery

## 2018-01-27 ENCOUNTER — Telehealth (INDEPENDENT_AMBULATORY_CARE_PROVIDER_SITE_OTHER): Payer: Self-pay

## 2018-01-27 NOTE — Telephone Encounter (Signed)
I called patient to let him know that he can come back in and we will redo his Dynaflex wrap.

## 2018-01-27 NOTE — Telephone Encounter (Signed)
Patient called stating that he is having pain in both of his legs. Stated that it feels like a dull toothache and thinks that the wraps may be to tight.  CB# is [phone] or [phone].  Please advise.  Thank You.

## 2018-01-27 NOTE — Telephone Encounter (Signed)
Hannah @ [facility] request new copy of the referral & Rx. Hannah's callback # [phone]

## 2018-01-31 ENCOUNTER — Telehealth (INDEPENDENT_AMBULATORY_CARE_PROVIDER_SITE_OTHER): Payer: Self-pay | Admitting: Orthopedic Surgery

## 2018-01-31 NOTE — Telephone Encounter (Signed)
Curt Bears from [facility] left a message requesting the referral and RX be sent to her at [phone].  CB#[phone].  Thank you.

## 2018-02-01 NOTE — Telephone Encounter (Signed)
I called and spoke to [place] and she stated that patient had called wanting PT but he recently cancelled appt with them and she will return call after speaking to him.

## 2018-02-02 ENCOUNTER — Ambulatory Visit (INDEPENDENT_AMBULATORY_CARE_PROVIDER_SITE_OTHER): Payer: Medicare Other | Admitting: Orthopedic Surgery

## 2018-02-02 ENCOUNTER — Encounter (INDEPENDENT_AMBULATORY_CARE_PROVIDER_SITE_OTHER): Payer: Self-pay | Admitting: Orthopedic Surgery

## 2018-02-02 VITALS — Ht 67.0 in | Wt 176.0 lb

## 2018-02-02 DIAGNOSIS — L97929 Non-pressure chronic ulcer of unspecified part of left lower leg with unspecified severity: Secondary | ICD-10-CM

## 2018-02-02 DIAGNOSIS — L97919 Non-pressure chronic ulcer of unspecified part of right lower leg with unspecified severity: Secondary | ICD-10-CM | POA: Diagnosis not present

## 2018-02-02 DIAGNOSIS — I87333 Chronic venous hypertension (idiopathic) with ulcer and inflammation of bilateral lower extremity: Secondary | ICD-10-CM

## 2018-02-03 ENCOUNTER — Encounter (INDEPENDENT_AMBULATORY_CARE_PROVIDER_SITE_OTHER): Payer: Self-pay | Admitting: Orthopedic Surgery

## 2018-02-03 ENCOUNTER — Telehealth: Payer: Self-pay

## 2018-02-03 NOTE — Telephone Encounter (Signed)
[facility] was filled out and faxed to [phone].

## 2018-02-03 NOTE — Telephone Encounter (Signed)
LVM for pt for return call regarding procedural schedule time change.

## 2018-02-03 NOTE — Progress Notes (Signed)
Office Visit Note   Patient: [name] [name]           Date of Birth: 1924/02/16           MRN: [id number] Visit Date: 02/02/2018              Requested by: [name], MD [place], [place] [address] PCP: [name], MD  Chief Complaint  Patient presents with  . Right Leg - Follow-up    Bilateral dynaflex compression wraps.   . Left Leg - Follow-up      HPI: Patient is a 84 year old gentleman with venous insufficiency bilateral lower extremities.  Patient has been in a Dynaflex wrap bilaterally he has had family help and has been getting his legs elevated better he presents at this time for reevaluation.  Assessment & Plan: Visit Diagnoses:  1. Idiopathic chronic venous hypertension of both lower extremities with ulcer and inflammation ([place])     Plan: Discussed the patient has made excellent improvement in decreasing the swelling.  Recommended a knee-high compression stocking and a sample sock was applied size large.  Recommended wearing this around the clock discussed the importance of not allowing the sock to wrinkle that could cause increased pressure.  Patient's son was with him and stated that he will help with compliance.  Discussed that treatment for the venous insufficiency is elevation and compression and exercise for muscle contraction.  Follow-Up Instructions: Return in about 2 weeks (around 02/16/2018).   Ortho Exam  Patient is alert, oriented, no adenopathy, well-dressed, normal affect, normal respiratory effort. Examination the venous ulcers in both legs have resolved there is good wrinkling of the skin there is venous brawny skin color changes but no drainage in either lower extremity.  There are no plantar ulcers.  Patient's calf measures 40 cm in circumference bilaterally.  Imaging: No results found. No images are attached to the encounter.  Labs: Lab Results  Component Value Date   CRP 16.2 (H) 10/20/2011   LABURIC 4.7 10/17/2006     REPTSTATUS 03/31/2015 FINAL 03/30/2015   GRAMSTAIN Abundant 12/18/2015   GRAMSTAIN WBC present-predominately PMN 12/18/2015   GRAMSTAIN No Squamous Epithelial Cells Seen 12/18/2015   GRAMSTAIN No Organisms Seen 12/18/2015   CULT 9,000 COLONIES/mL INSIGNIFICANT GROWTH 03/30/2015   LABORGA NORMAL SKIN FLORA 12/18/2015     Lab Results  Component Value Date   ALBUMIN 4.1 05/18/2016   ALBUMIN 3.2 (L) 10/17/2012   ALBUMIN 1.9 (L) 10/24/2011   LABURIC 4.7 10/17/2006    Body mass index is 27.57 kg/m.  Orders:  No orders of the defined types were placed in this encounter.  No orders of the defined types were placed in this encounter.    Procedures: No procedures performed  Clinical Data: No additional findings.  ROS:  All other systems negative, except as noted in the HPI. Review of Systems  Objective: Vital Signs: Ht 5\' 7"  (1.702 m)   Wt 176 lb (79.8 kg)   BMI 27.57 kg/m   Specialty Comments:  No specialty comments available.  PMFS History: Patient Active Problem List   Diagnosis Date Noted  . PCO (posterior capsular opacification), bilateral 05/26/2017  . Weakness of right leg 04/20/2017  . 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 Exudative age-related macular degeneration of right eye with active choroidal neovascularization ([place]) 09/30/2016  . Pseudophakia of both eyes 09/30/2016  . Impingement syndrome of right shoulder 06/16/2016  .  Impingement syndrome of left shoulder 06/16/2016  . Bilateral leg edema 06/14/2016  . Dizziness and giddiness 06/26/2015  . Abnormality of gait 06/26/2015  . Anxiety 04/14/2015  . GERD (gastroesophageal reflux disease) 04/03/2015  . OSA (obstructive sleep apnea) 04/03/2015  . HLD (hyperlipidemia) 04/03/2015  . SOB (shortness of breath)   . Left elbow fracture 03/29/2015  . Dizziness 01/01/2015  . LBP (low back pain) 01/01/2015  . Malignant neoplasm of prostate ([place]) 01/01/2015   . BP (high blood pressure) 01/01/2015  . DOE (dyspnea on exertion) 10/26/2013  . Essential hypertension, benign 10/26/2013  . Post-traumatic wound infection 10/17/2012  . Cellulitis 10/17/2012  . Atrial fibrillation ([place]) 10/22/2011  . DVT, lower extremity, distal, chronic ([place]) 10/22/2011  . Pacemaker 10/19/2011  . Prostate cancer ([place]) 10/19/2011  . COPD (chronic obstructive pulmonary disease) with emphysema ([place]) 02/04/2011  . Anemia 12/10/2010   Past Medical History:  Diagnosis Date  . Abnormality of gait    due to low back pain , uses  cane and walker  . Arthritis   . Bilateral lower extremity edema    wears TED hose  . Cancer [facility])    prostate- treated with radiation  . Cardiac pacemaker in situ 10/11/2006   medtronic  . Chronic iron deficiency anemia oncologist/ hematoloist-- dr Alen Blew   treatment IV Iron infusions  . Chronic low back pain   . CKD (chronic kidney disease), stage III ([place])   . COPD with emphysema [facility])    pulmologist-  dr Elsworth Soho  . DDD (degenerative disc disease), lumbosacral   . Degenerative scoliosis   . Dyspnea    with exertion  . Full dentures   . GERD (gastroesophageal reflux disease)   . Gross hematuria   . Headache    history of migraines- "way in the past"  . Hereditary and idiopathic peripheral neuropathy    bilateral lower leg  . Hiatal hernia   . History of DVT of lower extremity 10/19/2011   chronic distal popliteal right lower extremity  . History of external beam radiation therapy 2008   prostate cancer  . History of pancreatitis 2013  . History of pneumothorax 09/2006   iatrogenic-- in setting cardiac pacemaker placement  . History of prostate cancer 2008   s/p  external radiation therapy  . Hypertension   . Lesion of bladder   . Macular degeneration of right eye   . Nocturia   . OSA on CPAP   . PAF (paroxysmal atrial fibrillation) ([place]) 10/2011   1st episode documented 09/ 2013 admission for gallstons/ pancreatitis and  prior to cholecystectomy , went back in NSR without interventeion  . Presence of permanent cardiac pacemaker   . Sinus node dysfunction (H[facility])    s/p  cardiac pacemaker placement 10-11-2006  . Wears glasses   . Wears hearing aid in both ears     Family History  Problem Relation Age of Onset  . Heart disease Father   . Heart disease Mother   . Colon cancer Mother   . Stroke Mother   . Cancer Sister   . Heart disease Sister   . Heart attack Neg Hx   . Hypertension Neg Hx     Past Surgical History:  Procedure Laterality Date  . BALLOON DILATION N/A 01/14/2015   Procedure: BALLOON DILATION;  Surgeon: Garlan Fair, MD;  [facility];  Service: Endoscopy;  Laterality: N/A;  . BALLOON DILATION N/A 10/12/2017   Procedure: BALLOON DILATION;  Surgeon: Otis Brace, MD;  [facility];  Service: Gastroenterology;  Laterality: N/A;  . BIOPSY  10/12/2017   Procedure: BIOPSY;  Surgeon: Otis Brace, MD;  Location: [place] [facility];  Service: Gastroenterology;;  . CARDIAC CATHETERIZATION  08-29-2000   [place]   no significant obstructive CAD, intact globle LV size and systolic function with regional wall motion abnormalities noted,  ?coronary spasm producing wall motion abnormality, elevated CPK and chest pain (ef 55%)  . CARDIAC PACEMAKER PLACEMENT  10-11-2006    dr Leonia Reeves   W/  ATRIAL LEAD REVISION 10-12-2006   (medtronic)  . CARDIOVASCULAR STRESS TEST  11-18-2011    dr Irish Lack   Low risk nuclear study w/ no ishemia/  normal wall motion,  post-stress ef 48% (LVEF 56% on prior study 2008)  . CARPAL TUNNEL RELEASE Left 11/14/2014   Procedure: CARPAL TUNNEL RELEASE LEFT THUMB;  Surgeon: Daryll Brod, MD;  Location: [facility];  Service: Orthopedics;  Laterality: Left;  ANESTHESIA:  IV REGIONAL FAB  . CATARACT EXTRACTION W/ INTRAOCULAR LENS  IMPLANT, BILATERAL    . CHOLECYSTECTOMY  10/23/2011  . CHOLECYSTECTOMY  10/23/2011   Procedure: CHOLECYSTECTOMY;  Surgeon:  Rolm Bookbinder, MD;  Location: [facility];  Service: General;  Laterality: N/A;  with intraoperative cholangiogram  . COLONOSCOPY    . ESOPHAGOGASTRODUODENOSCOPY (EGD) WITH PROPOFOL N/A 01/14/2015   Procedure: ESOPHAGOGASTRODUODENOSCOPY (EGD) WITH PROPOFOL;  Surgeon: Garlan Fair, MD;  [facility];  Service: Endoscopy;  Laterality: N/A;  . ESOPHAGOGASTRODUODENOSCOPY (EGD) WITH PROPOFOL N/A 10/12/2017   Procedure: ESOPHAGOGASTRODUODENOSCOPY (EGD) WITH PROPOFOL;  Surgeon: Otis Brace, MD;  Location: [facility];  Service: Gastroenterology;  Laterality: N/A;  . I&D EXTREMITY Left 10/20/2012   Procedure: LEFT LEG IRRIGATION AND DEBRIDEMENT, AND WOUND VAC APPLICATION;  Surgeon: Marianna Payment, MD;  [facility];  Service: Orthopedics;  Laterality: Left;  . I&D EXTREMITY Left 10/22/2012   Procedure: IRRIGATION AND DEBRIDEMENT EXTREMITY;  Surgeon: Marianna Payment, MD;  [facility];  Service: Orthopedics;  Laterality: Left;  . INGUINAL HERNIA REPAIR Left yrs ago  . LAPAROSCOPIC ASSISTED VENTRAL HERNIA REPAIR  08-08-2009   dr Excell Seltzer   AND OPEN REPAIR RECURRENT LEFT INGUINAL HERNIA  . LAPAROSCOPIC INGUINAL HERNIA REPAIR Left 07-24-1999    dr Excell Seltzer  . OPEN VENTRAL HERNIA REPAIR /  LYSIS ADHESIONS  09-23-2010    dr Donne Hazel  . ORIF ELBOW FRACTURE Left 03/29/2015   Procedure: LEFT ELBOW OPEN REDUCTION INTERNAL FIXATION (ORIF) DISTAL HUMERUS FRACTURE WITH OLECRANON OSTEOTOMY AND ULNAR NERVE RELEASE;  Surgeon: Roseanne Kaufman, MD;  Location: [facility];  Service: Orthopedics;  Laterality: Left;  . REPAIR SUPRAUMBILICAL HERNIA   [phone]   dr Excell Seltzer  . SHOULDER ARTHROSCOPY WITH DISTAL CLAVICLE RESECTION Right 11-07-2007   dr Rhona Raider   [place] ACROMIOPLASTY  . SKIN SPLIT GRAFT Left 10/22/2012   Procedure: SKIN GRAFT SPLIT THICKNESS;  Surgeon: Marianna Payment, MD;  [facility];  Service: Orthopedics;  Laterality: Left;  . TOTAL KNEE ARTHROPLASTY Right  1990's  . TRANSTHORACIC ECHOCARDIOGRAM  06-12-2012   dr Irish Lack   ef 50-55%/ mild LAE/ mild MR and TR/  AV sclerosis without stenosis/  RVSP 32mmHg  . TRANSURETHRAL RESECTION OF BLADDER TUMOR N/A 07/15/2017   Procedure: CYSTOSCOPY TRANSURETHRAL RESECTION OF BLADDER TUMOR (TURBT);  Surgeon: Kathie Rhodes, MD;  Location: [facility];  Service: Urology;  Laterality: N/A;   Social History   Occupational History  . 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 Smoking status: Former Smoker    Packs/day: 1.50    Years: 35.00    Pack years: 52.50    Types: Cigarettes    Last attempt to quit: 02/15/1973    Years since quitting: 44.9  . Smokeless tobacco: Never Used  Substance and Sexual Activity  . Alcohol use: No  . Drug use: No  . Sexual activity: Never

## 2018-02-06 NOTE — Telephone Encounter (Signed)
Called and spoke with patients wife [name], she is aware that procedure has been moved to 1:30PM and that patient has to be there at 11:30AM; patients wife also aware that he can have a light breakfast before 6:30AM.

## 2018-02-10 ENCOUNTER — Other Ambulatory Visit: Payer: Self-pay

## 2018-02-10 ENCOUNTER — Ambulatory Visit (HOSPITAL_COMMUNITY)
Admission: RE | Admit: 2018-02-10 | Discharge: 2018-02-10 | Disposition: A | Discharge status: Home / Self Care | Payer: Medicare Other | Attending: Internal Medicine | Admitting: Internal Medicine

## 2018-02-10 ENCOUNTER — Ambulatory Visit (HOSPITAL_COMMUNITY)
Admission: RE | Disposition: A | Discharge status: Home / Self Care | Payer: Self-pay | Source: Home / Self Care | Attending: Internal Medicine

## 2018-02-10 DIAGNOSIS — I480 Paroxysmal atrial fibrillation: Secondary | ICD-10-CM | POA: Insufficient documentation

## 2018-02-10 DIAGNOSIS — Z79899 Other long term (current) drug therapy: Secondary | ICD-10-CM | POA: Diagnosis not present

## 2018-02-10 DIAGNOSIS — I129 Hypertensive chronic kidney disease with stage 1 through stage 4 chronic kidney disease, or unspecified chronic kidney disease: Secondary | ICD-10-CM | POA: Diagnosis not present

## 2018-02-10 DIAGNOSIS — I495 Sick sinus syndrome: Secondary | ICD-10-CM | POA: Insufficient documentation

## 2018-02-10 DIAGNOSIS — K219 Gastro-esophageal reflux disease without esophagitis: Secondary | ICD-10-CM | POA: Insufficient documentation

## 2018-02-10 DIAGNOSIS — M1990 Unspecified osteoarthritis, unspecified site: Secondary | ICD-10-CM | POA: Insufficient documentation

## 2018-02-10 DIAGNOSIS — N183 Chronic kidney disease, stage 3 (moderate): Secondary | ICD-10-CM | POA: Insufficient documentation

## 2018-02-10 DIAGNOSIS — Z86718 Personal history of other venous thrombosis and embolism: Secondary | ICD-10-CM | POA: Diagnosis not present

## 2018-02-10 DIAGNOSIS — G4733 Obstructive sleep apnea (adult) (pediatric): Secondary | ICD-10-CM | POA: Insufficient documentation

## 2018-02-10 DIAGNOSIS — Z7982 Long term (current) use of aspirin: Secondary | ICD-10-CM | POA: Insufficient documentation

## 2018-02-10 DIAGNOSIS — Z45010 Encounter for checking and testing of cardiac pacemaker pulse generator [battery]: Secondary | ICD-10-CM | POA: Diagnosis not present

## 2018-02-10 HISTORY — PX: PPM GENERATOR CHANGEOUT: EP1233

## 2018-02-10 LAB — POCT I-STAT, CHEM 8
BUN: 25 mg/dL — ABNORMAL HIGH (ref 8–23)
Calcium, Ion: 1.19 mmol/L (ref 1.15–1.40)
Chloride: 105 mmol/L (ref 98–111)
Creatinine, Ser: 1.1 mg/dL (ref 0.61–1.24)
Glucose, Bld: 88 mg/dL (ref 70–99)
HCT: 32 % — ABNORMAL LOW (ref 39.0–52.0)
HEMOGLOBIN: 10.9 g/dL — AB (ref 13.0–17.0)
Potassium: 4.1 mmol/L (ref 3.5–5.1)
Sodium: 137 mmol/L (ref 135–145)
TCO2: 27 mmol/L (ref 22–32)

## 2018-02-10 LAB — SURGICAL PCR SCREEN
MRSA, PCR: POSITIVE — AB
STAPHYLOCOCCUS AUREUS: POSITIVE — AB

## 2018-02-10 SURGERY — PPM GENERATOR CHANGEOUT

## 2018-02-10 MED ORDER — MUPIROCIN 2 % EX OINT
TOPICAL_OINTMENT | CUTANEOUS | Status: AC
  Administered 2018-02-10: 1 via TOPICAL
  Filled 2018-02-10: qty 22

## 2018-02-10 MED ORDER — SODIUM CHLORIDE 0.9 % IV SOLN
INTRAVENOUS | Status: DC
  Administered 2018-02-10: 13:00:00 via INTRAVENOUS

## 2018-02-10 MED ORDER — MUPIROCIN 2 % EX OINT
1.0000 "application " | TOPICAL_OINTMENT | Freq: Once | CUTANEOUS | Status: AC
  Administered 2018-02-10: 1 via TOPICAL

## 2018-02-10 MED ORDER — CHLORHEXIDINE GLUCONATE 4 % EX LIQD
60.0000 mL | Freq: Once | CUTANEOUS | Status: DC
  Filled 2018-02-10: qty 60

## 2018-02-10 MED ORDER — SODIUM CHLORIDE 0.9 % IV SOLN: INTRAVENOUS | Status: DC

## 2018-02-10 MED ORDER — CEFAZOLIN SODIUM-DEXTROSE 2-4 GM/100ML-% IV SOLN
2.0000 g | INTRAVENOUS | Status: AC
  Administered 2018-02-10: 2 g via INTRAVENOUS
  Filled 2018-02-10: qty 100

## 2018-02-10 MED ORDER — SODIUM CHLORIDE 0.9 % IV SOLN
INTRAVENOUS | Status: AC
  Filled 2018-02-10: qty 2

## 2018-02-10 MED ORDER — ONDANSETRON HCL 4 MG/2ML IJ SOLN: 4.0000 mg | Freq: Four times a day (QID) | INTRAMUSCULAR | Status: DC | PRN

## 2018-02-10 MED ORDER — ACETAMINOPHEN 325 MG PO TABS: 325.0000 mg | ORAL_TABLET | ORAL | Status: DC | PRN

## 2018-02-10 MED ORDER — LIDOCAINE HCL (PF) 1 % IJ SOLN
INTRAMUSCULAR | Status: DC | PRN
  Administered 2018-02-10: 50 mL

## 2018-02-10 MED ORDER — LIDOCAINE HCL 1 % IJ SOLN
INTRAMUSCULAR | Status: AC
  Filled 2018-02-10: qty 60

## 2018-02-10 MED ORDER — SODIUM CHLORIDE 0.9 % IV SOLN
80.0000 mg | INTRAVENOUS | Status: AC
  Administered 2018-02-10: 80 mg
  Filled 2018-02-10: qty 2

## 2018-02-10 MED ORDER — CEFAZOLIN SODIUM-DEXTROSE 2-4 GM/100ML-% IV SOLN
INTRAVENOUS | Status: AC
  Filled 2018-02-10: qty 100

## 2018-02-10 SURGICAL SUPPLY — 6 items
CABLE SURGICAL S-101-97-12 (CABLE) ×3 IMPLANT
HEMOSTAT SURGICEL 2X4 FIBR (HEMOSTASIS) ×2 IMPLANT
IPG PACE AZUR XT DR MRI W1DR01 (Pacemaker) IMPLANT
PACE AZURE XT DR MRI W1DR01 (Pacemaker) ×3 IMPLANT
PAD PRO RADIOLUCENT 2001M-C (PAD) ×3 IMPLANT
TRAY PACEMAKER INSERTION (PACKS) ×3 IMPLANT

## 2018-02-10 NOTE — Interval H&P Note (Signed)
History and Physical Interval Note:  02/10/2018 1:13 PM  [name] [name]  has presented today for surgery, with the diagnosis of ERI  The various methods of treatment have been discussed with the patient and family. After consideration of risks, benefits and other options for treatment, the patient has consented to  Procedure(s): PPM GENERATOR CHANGEOUT (N/A) as a surgical intervention .  The patient's history has been reviewed, patient examined, no change in status, stable for surgery.  I have reviewed the patient's chart and labs.  Questions were answered to the patient's satisfaction.   Stable   Virl Axe

## 2018-02-10 NOTE — Discharge Instructions (Signed)
Pacemaker Battery Change, Care After  This sheet gives you information about how to care for yourself after your procedure. Your health care provider may also give you more specific instructions. If you have problems or questions, contact your health care provider. What can I expect after the procedure? After your procedure, it is common to have:  Pain or soreness at the site where the pacemaker was inserted.  Swelling at the site where the pacemaker was inserted. Follow these instructions at home: Incision care   Keep the incision clean and dry. ? Do not take baths, swim, or use a hot tub until your health care provider approves. ? You may shower the day after your procedure, or as directed by your health care provider. ? Pat the area dry with a clean towel. Do not rub the area. This may cause bleeding.  Follow instructions from your health care provider about how to take care of your incision. Make sure you: ? Wash your hands with soap and water before you change your bandage (dressing). If soap and water are not available, use hand sanitizer. ? Change your dressing as told by your health care provider. ? Leave stitches (sutures), skin glue, or adhesive strips in place. These skin closures may need to stay in place for 2 weeks or longer. If adhesive strip edges start to loosen and curl up, you may trim the loose edges. Do not remove adhesive strips completely unless your health care provider tells you to do that.  Check your incision area every day for signs of infection. Check for: ? More redness, swelling, or pain. ? More fluid or blood. ? Warmth. ? Pus or a bad smell. Activity  Do not lift anything that is heavier than 10 lb (4.5 kg) until your health care provider says it is okay to do so.  For the first 2 weeks, or as long as told by your health care provider: ? Avoid lifting your left arm higher than your shoulder. ? Be gentle when you move your arms over your head. It is  okay to raise your arm to comb your hair. ? Avoid strenuous exercise.  Ask your health care provider when it is okay to: ? Resume your normal activities. ? Return to work or school. ? Resume sexual activity. Eating and drinking  Eat a heart-healthy diet. This should include plenty of fresh fruits and vegetables, whole grains, low-fat dairy products, and lean protein like chicken and fish.  Limit alcohol intake to no more than 1 drink a day for non-pregnant women and 2 drinks a day for men. One drink equals 12 oz of beer, 5 oz of wine, or 1 oz of hard liquor.  Check ingredients and nutrition facts on packaged foods and beverages. Avoid the following types of food: ? Food that is high in salt (sodium). ? Food that is high in saturated fat, like full-fat dairy or red meat. ? Food that is high in trans fat, like fried food. ? Food and drinks that are high in sugar. Lifestyle  Do not use any products that contain nicotine or tobacco, such as cigarettes and e-cigarettes. If you need help quitting, ask your health care provider.  Take steps to manage and control your weight.  Get regular exercise. Aim for 150 minutes of moderate-intensity exercise (such as walking or yoga) or 75 minutes of vigorous exercise (such as running or swimming) each week.  Manage other health problems, such as diabetes or high blood pressure. Ask your  health care provider how you can manage these conditions. General instructions  Do not drive for 24 hours after your procedure if you were given a medicine to help you relax (sedative).  Take over-the-counter and prescription medicines only as told by your health care provider.  Avoid putting pressure on the area where the pacemaker was placed.  If you need an MRI after your pacemaker has been placed, be sure to tell the health care provider who orders the MRI that you have a pacemaker.  Avoid close and prolonged exposure to electrical devices that have strong  magnetic fields. These include: ? Cell phones. Avoid keeping them in a pocket near the pacemaker, and try using the ear opposite the pacemaker. ? MP3 players. ? Household appliances, like microwaves. ? Metal detectors. ? Electric generators. ? High-tension wires.  Keep all follow-up visits as directed by your health care provider. This is important. Contact a health care provider if:  You have pain at the incision site that is not relieved by over-the-counter or prescription medicines.  You have any of these around your incision site or coming from it: ? More redness, swelling, or pain. ? Fluid or blood. ? Warmth to the touch. ? Pus or a bad smell.  You have a fever.  You feel brief, occasional palpitations, light-headedness, or any symptoms that you think might be related to your heart. Get help right away if:  You experience chest pain that is different from the pain at the pacemaker site.  You develop a red streak that extends above or below the incision site.  You experience shortness of breath.  You have palpitations or an irregular heartbeat.  You have light-headedness that does not go away quickly.  You faint or have dizzy spells.  Your pulse suddenly drops or increases rapidly and does not return to normal.  You begin to gain weight and your legs and ankles swell. Summary  After your procedure, it is common to have pain, soreness, and some swelling where the pacemaker was inserted.  Make sure to keep your incision clean and dry. Follow instructions from your health care provider about how to take care of your incision.  Check your incision every day for signs of infection, such as more pain or swelling, pus or a bad smell, warmth, or leaking fluid and blood.  Avoid strenuous exercise and lifting your left arm higher than your shoulder for 2 weeks, or as long as told by your health care provider. This information is not intended to replace advice given to you by  your health care provider. Make sure you discuss any questions you have with your health care provider. Document Released: 11/22/2012 Document Revised: 12/25/2015 Document Reviewed: 12/25/2015 Elsevier Interactive Patient Education  2019 Elsevier Inc. Mupirocin nasal ointment What is this medicine? MUPIROCIN CALCIUM (myoo PEER oh sin KAL see um) is an antibiotic. It is used inside the nose to treat infections that are caused by certain bacteria. This helps prevent the spread of infection to patients and health care workers during outbreaks at institutions. This medicine may be used for other purposes; ask your health care provider or pharmacist if you have questions. COMMON BRAND NAME(S): Bactroban What should I tell my health care provider before I take this medicine? They need to know if you have any of these conditions: -an unusual or allergic reaction to mupirocin, other medicines, foods, dyes, or preservatives -pregnant or trying to get pregnant -breast-feeding How should I use this medicine? This medicine  is only for use inside the nose. Follow the directions on the prescription label. Wash your hands before and after use. Squeeze half the contents of a single-use tube into one nostril, then squeeze the other half into the other nostril. Press the sides of your nose together and gently massage after application to spread the ointment throughout the nostrils. Do not use your medicine more often than directed. Finish the full course of medicine prescribed by your doctor or health care professional even if you think your condition is better. Talk to your pediatrician regarding the use of this medicine in children. Special care may be needed. Overdosage: If you think you have taken too much of this medicine contact a poison control center or emergency room at once. NOTE: This medicine is only for you. Do not share this medicine with others. What if I miss a dose? If you miss a dose, take it as  soon as you can. If it is almost time for your next dose, take only that dose. Do not take double or extra doses. What may interact with this medicine? Interactions are not expected. Do not use any other nose products without telling your doctor or health care professional. This list may not describe all possible interactions. Give your health care provider a list of all the medicines, herbs, non-prescription drugs, or dietary supplements you use. Also tell them if you smoke, drink alcohol, or use illegal drugs. Some items may interact with your medicine. What should I watch for while using this medicine? If your nose is severely irritated, burning or stinging from use of this medicine, stop using it and contact your doctor or health care professional. Do not get this medicine in your eyes. If you do, rinse out with plenty of cool tap water. What side effects may I notice from receiving this medicine? Side effects that you should report to your doctor or health care professional as soon as possible: -severe irritation, burning, stinging, or pain Side effects that usually do not require medical attention (report to your doctor or health care professional if they continue or are bothersome): -altered taste -cough -headache -skin itching -sore throat -stuffy or runny nose This list may not describe all possible side effects. Call your doctor for medical advice about side effects. You may report side effects to FDA at 1-800-FDA-1088. Where should I keep my medicine? Keep out of the reach of children. Store at room temperature between 15 and 30 degrees C (59 and 86 degrees F). Do not refrigerate. One tube of ointment is for single use in both nostrils. Throw away after use. NOTE: This sheet is a summary. It may not cover all possible information. If you have questions about this medicine, talk to your doctor, pharmacist, or health care provider.  2019 Elsevier/Gold Standard (2007-08-21  14:36:10)

## 2018-02-13 ENCOUNTER — Encounter (HOSPITAL_COMMUNITY): Payer: Self-pay | Admitting: Internal Medicine

## 2018-02-13 MED FILL — Lidocaine HCl Local Inj 1%: INTRAMUSCULAR | Qty: 60 | Status: AC

## 2018-02-17 DIAGNOSIS — I10 Essential (primary) hypertension: Secondary | ICD-10-CM | POA: Diagnosis not present

## 2018-02-17 DIAGNOSIS — C61 Malignant neoplasm of prostate: Secondary | ICD-10-CM | POA: Diagnosis not present

## 2018-02-17 DIAGNOSIS — K432 Incisional hernia without obstruction or gangrene: Secondary | ICD-10-CM | POA: Diagnosis not present

## 2018-02-17 DIAGNOSIS — R600 Localized edema: Secondary | ICD-10-CM | POA: Diagnosis not present

## 2018-02-17 DIAGNOSIS — I872 Venous insufficiency (chronic) (peripheral): Secondary | ICD-10-CM | POA: Diagnosis not present

## 2018-02-17 DIAGNOSIS — G629 Polyneuropathy, unspecified: Secondary | ICD-10-CM | POA: Diagnosis not present

## 2018-02-17 DIAGNOSIS — D649 Anemia, unspecified: Secondary | ICD-10-CM | POA: Diagnosis not present

## 2018-02-17 DIAGNOSIS — R2689 Other abnormalities of gait and mobility: Secondary | ICD-10-CM | POA: Diagnosis not present

## 2018-02-17 DIAGNOSIS — M150 Primary generalized (osteo)arthritis: Secondary | ICD-10-CM | POA: Diagnosis not present

## 2018-02-17 DIAGNOSIS — Z5181 Encounter for therapeutic drug level monitoring: Secondary | ICD-10-CM | POA: Diagnosis not present

## 2018-02-20 ENCOUNTER — Ambulatory Visit: Payer: Self-pay

## 2018-02-21 DIAGNOSIS — R269 Unspecified abnormalities of gait and mobility: Secondary | ICD-10-CM | POA: Diagnosis not present

## 2018-02-21 DIAGNOSIS — R262 Difficulty in walking, not elsewhere classified: Secondary | ICD-10-CM | POA: Diagnosis not present

## 2018-02-21 DIAGNOSIS — M545 Low back pain: Secondary | ICD-10-CM | POA: Diagnosis not present

## 2018-02-21 DIAGNOSIS — R2681 Unsteadiness on feet: Secondary | ICD-10-CM | POA: Diagnosis not present

## 2018-02-22 ENCOUNTER — Ambulatory Visit (INDEPENDENT_AMBULATORY_CARE_PROVIDER_SITE_OTHER): Payer: Medicare Other | Admitting: Nurse Practitioner

## 2018-02-22 ENCOUNTER — Ambulatory Visit: Payer: Self-pay

## 2018-02-22 DIAGNOSIS — I495 Sick sinus syndrome: Secondary | ICD-10-CM | POA: Diagnosis not present

## 2018-02-22 DIAGNOSIS — R159 Full incontinence of feces: Secondary | ICD-10-CM | POA: Diagnosis not present

## 2018-02-22 LAB — CUP PACEART INCLINIC DEVICE CHECK
Date Time Interrogation Session: 20200108105413
Implantable Lead Implant Date: 20080826
Implantable Lead Implant Date: 20080826
Implantable Lead Location: 753860
Implantable Lead Model: 5076
Implantable Pulse Generator Implant Date: 20191227
MDC IDC LEAD LOCATION: 753859

## 2018-02-22 NOTE — Patient Instructions (Signed)
Medication Instructions:  none If you need a refill on your cardiac medications before your next appointment, please call your pharmacy.   Lab work: none If you have labs (blood work) drawn today and your tests are completely normal, you will receive your results only by: . MyChart Message (if you have MyChart) OR . A paper copy in the mail If you have any lab test that is abnormal or we need to change your treatment, we will call you to review the results.  Testing/Procedures: none  Follow-Up: At [facility], you and your health needs are our priority.  As part of our continuing mission to provide you with exceptional heart care, we have created designated Provider Care Teams.  These Care Teams include your primary Cardiologist (physician) and Advanced Practice Providers (APPs -  Physician Assistants and Nurse Practitioners) who all work together to provide you with the care you need, when you need it. .   Any Other Special Instructions Will Be Listed Below (If Applicable).    

## 2018-02-22 NOTE — Progress Notes (Signed)

## 2018-02-23 DIAGNOSIS — H353122 Nonexudative age-related macular degeneration, left eye, intermediate dry stage: Secondary | ICD-10-CM | POA: Diagnosis not present

## 2018-02-23 DIAGNOSIS — Z961 Presence of intraocular lens: Secondary | ICD-10-CM | POA: Diagnosis not present

## 2018-02-23 DIAGNOSIS — H353211 Exudative age-related macular degeneration, right eye, with active choroidal neovascularization: Secondary | ICD-10-CM | POA: Diagnosis not present

## 2018-02-23 DIAGNOSIS — M545 Low back pain: Secondary | ICD-10-CM | POA: Diagnosis not present

## 2018-02-23 DIAGNOSIS — H1132 Conjunctival hemorrhage, left eye: Secondary | ICD-10-CM | POA: Diagnosis not present

## 2018-02-23 DIAGNOSIS — H26493 Other secondary cataract, bilateral: Secondary | ICD-10-CM | POA: Diagnosis not present

## 2018-02-23 DIAGNOSIS — R262 Difficulty in walking, not elsewhere classified: Secondary | ICD-10-CM | POA: Diagnosis not present

## 2018-02-23 DIAGNOSIS — R2681 Unsteadiness on feet: Secondary | ICD-10-CM | POA: Diagnosis not present

## 2018-02-23 DIAGNOSIS — R269 Unspecified abnormalities of gait and mobility: Secondary | ICD-10-CM | POA: Diagnosis not present

## 2018-02-24 DIAGNOSIS — Z8551 Personal history of malignant neoplasm of bladder: Secondary | ICD-10-CM | POA: Diagnosis not present

## 2018-02-24 DIAGNOSIS — Z8546 Personal history of malignant neoplasm of prostate: Secondary | ICD-10-CM | POA: Diagnosis not present

## 2018-02-27 ENCOUNTER — Telehealth: Payer: Self-pay | Admitting: Pulmonary Disease

## 2018-02-27 ENCOUNTER — Encounter: Payer: Self-pay | Admitting: Internal Medicine

## 2018-02-27 ENCOUNTER — Ambulatory Visit: Payer: Self-pay | Admitting: Neurology

## 2018-02-27 NOTE — Telephone Encounter (Signed)
Called and spoke with Patient.  Patient is aware that [facility] is currently closed. Explained that someone will call [facility] in the morning to follow up on CPAP.  Patient stated understanding.

## 2018-02-28 ENCOUNTER — Telehealth: Payer: Self-pay | Admitting: Pulmonary Disease

## 2018-02-28 DIAGNOSIS — R2681 Unsteadiness on feet: Secondary | ICD-10-CM | POA: Diagnosis not present

## 2018-02-28 DIAGNOSIS — R262 Difficulty in walking, not elsewhere classified: Secondary | ICD-10-CM | POA: Diagnosis not present

## 2018-02-28 DIAGNOSIS — R269 Unspecified abnormalities of gait and mobility: Secondary | ICD-10-CM | POA: Diagnosis not present

## 2018-02-28 DIAGNOSIS — M545 Low back pain: Secondary | ICD-10-CM | POA: Diagnosis not present

## 2018-02-28 NOTE — Telephone Encounter (Signed)
I called [facility] but [facility] is closed. Will try to call again in the morning.

## 2018-02-28 NOTE — Telephone Encounter (Signed)
Spoke with pt's wife and advised her that [facility] has the order. I spoke to someone over there and they stated it is still in verification/process status and that they would send an email to them about pt's order. She verbalized understanding and nothing further is needed.

## 2018-03-02 DIAGNOSIS — M545 Low back pain: Secondary | ICD-10-CM | POA: Diagnosis not present

## 2018-03-02 DIAGNOSIS — R2681 Unsteadiness on feet: Secondary | ICD-10-CM | POA: Diagnosis not present

## 2018-03-02 DIAGNOSIS — R262 Difficulty in walking, not elsewhere classified: Secondary | ICD-10-CM | POA: Diagnosis not present

## 2018-03-02 DIAGNOSIS — R269 Unspecified abnormalities of gait and mobility: Secondary | ICD-10-CM | POA: Diagnosis not present

## 2018-03-06 DIAGNOSIS — G894 Chronic pain syndrome: Secondary | ICD-10-CM | POA: Diagnosis not present

## 2018-03-06 DIAGNOSIS — Z79891 Long term (current) use of opiate analgesic: Secondary | ICD-10-CM | POA: Diagnosis not present

## 2018-03-06 DIAGNOSIS — M546 Pain in thoracic spine: Secondary | ICD-10-CM | POA: Diagnosis not present

## 2018-03-06 DIAGNOSIS — M48062 Spinal stenosis, lumbar region with neurogenic claudication: Secondary | ICD-10-CM | POA: Diagnosis not present

## 2018-03-06 NOTE — Telephone Encounter (Signed)
Called and spoke with [facility], she stated that they are needing the chart notes from 01/06/2018 addended to say that patient is complaint and benefiting from CPAP therapy as right now the notes only state that he is complaint. RA please advise, thank you.

## 2018-03-07 NOTE — Telephone Encounter (Signed)
Sending to [facility] - it is his note

## 2018-03-07 NOTE — Telephone Encounter (Signed)
The 02/20/2018 addendum states he is benefiting.   What more do they need?   Aaron Edelman

## 2018-03-07 NOTE — Telephone Encounter (Signed)
Spoke with [name] [name] and advised the note clearly states he is compliant and benefiting from CPAP  For some reason she can not see the 02/20/2018 addendum  I will fax to her at [phone]  Nothing further needed

## 2018-03-08 ENCOUNTER — Other Ambulatory Visit (INDEPENDENT_AMBULATORY_CARE_PROVIDER_SITE_OTHER): Payer: Self-pay | Admitting: Family

## 2018-03-09 ENCOUNTER — Telehealth: Payer: Self-pay | Admitting: Pulmonary Disease

## 2018-03-09 NOTE — Telephone Encounter (Signed)
Called and left message  with Jacqlyn Larsen, to call with questions.  Per message, Wyn Quaker, NP addendum needs to be signed and faxed to [phone]. Spoke with Aaron Edelman, he has signed addendum, and it has been faxed with addendum starred. Left detailed message with Nolon Lennert.

## 2018-03-10 NOTE — Telephone Encounter (Signed)
Do you want to refill? 

## 2018-03-10 NOTE — Telephone Encounter (Signed)
Called [place], [place], left message to call back if she needed anything further on Wyn Quaker, NP, addendum.

## 2018-03-13 DIAGNOSIS — I10 Essential (primary) hypertension: Secondary | ICD-10-CM | POA: Diagnosis not present

## 2018-03-13 DIAGNOSIS — R739 Hyperglycemia, unspecified: Secondary | ICD-10-CM | POA: Diagnosis not present

## 2018-03-13 DIAGNOSIS — I872 Venous insufficiency (chronic) (peripheral): Secondary | ICD-10-CM | POA: Diagnosis not present

## 2018-03-13 DIAGNOSIS — R600 Localized edema: Secondary | ICD-10-CM | POA: Diagnosis not present

## 2018-03-13 DIAGNOSIS — M150 Primary generalized (osteo)arthritis: Secondary | ICD-10-CM | POA: Diagnosis not present

## 2018-03-13 DIAGNOSIS — R2689 Other abnormalities of gait and mobility: Secondary | ICD-10-CM | POA: Diagnosis not present

## 2018-03-13 DIAGNOSIS — Z5181 Encounter for therapeutic drug level monitoring: Secondary | ICD-10-CM | POA: Diagnosis not present

## 2018-03-13 DIAGNOSIS — D649 Anemia, unspecified: Secondary | ICD-10-CM | POA: Diagnosis not present

## 2018-03-13 DIAGNOSIS — R634 Abnormal weight loss: Secondary | ICD-10-CM | POA: Diagnosis not present

## 2018-03-13 DIAGNOSIS — K432 Incisional hernia without obstruction or gangrene: Secondary | ICD-10-CM | POA: Diagnosis not present

## 2018-03-13 DIAGNOSIS — G629 Polyneuropathy, unspecified: Secondary | ICD-10-CM | POA: Diagnosis not present

## 2018-03-15 ENCOUNTER — Ambulatory Visit (INDEPENDENT_AMBULATORY_CARE_PROVIDER_SITE_OTHER): Payer: Medicare Other | Admitting: Orthopedic Surgery

## 2018-03-15 ENCOUNTER — Encounter (INDEPENDENT_AMBULATORY_CARE_PROVIDER_SITE_OTHER): Payer: Self-pay | Admitting: Orthopedic Surgery

## 2018-03-15 VITALS — Ht 69.0 in | Wt 170.0 lb

## 2018-03-15 DIAGNOSIS — S51012D Laceration without foreign body of left elbow, subsequent encounter: Secondary | ICD-10-CM | POA: Diagnosis not present

## 2018-03-15 NOTE — Progress Notes (Signed)
Office Visit Note   Patient: [name] [name]           Date of Birth: 06/08/1924           MRN: [id number] Visit Date: 03/15/2018              Requested by: [name], MD [place], [place] [id number] PCP: [name], MD  Chief Complaint  Patient presents with  . Right Hand - Pain  . Left Hand - Pain      HPI: Patient is a 85 year old gentleman who presents complaining of discoloration from bleeding to into his arms from his aspirin with a history of multiple skin tears on both upper extremities.  Patient states he has weakness in his hands is unable to button his shirt and has trouble writing denies any night pain denies any burning or tingling in the fingers.  Assessment & Plan: Visit Diagnoses:  1. Skin tear of elbow without complication, left, subsequent encounter     Plan: Arm sleeves were placed on the left arm he was given another study for the right arm.  Follow-Up Instructions: Return if symptoms worsen or fail to improve.   Ortho Exam  Patient is alert, oriented, no adenopathy, well-dressed, normal affect, normal respiratory effort. Examination patient has ecchymosis and bruising involving both upper extremities.  There is significant atrophy of the thenar hyperthenar and intrinsic muscles of both hands with weakness Phalen's and Tinel's test are negative no signs of carpal tunnel syndrome.  His previous skin tears on both arms have healed.  There is no cellulitis.  Imaging: No results found. No images are attached to the encounter.  Labs: Lab Results  Component Value Date   CRP 16.2 (H) 10/20/2011   LABURIC 4.7 10/17/2006   REPTSTATUS 03/31/2015 FINAL 03/30/2015   GRAMSTAIN Abundant 12/18/2015   GRAMSTAIN WBC present-predominately PMN 12/18/2015   GRAMSTAIN No Squamous Epithelial Cells Seen 12/18/2015   GRAMSTAIN No Organisms Seen 12/18/2015   CULT 9,000 COLONIES/mL INSIGNIFICANT GROWTH 03/30/2015   LABORGA NORMAL SKIN FLORA  12/18/2015     Lab Results  Component Value Date   ALBUMIN 4.1 05/18/2016   ALBUMIN 3.2 (L) 10/17/2012   ALBUMIN 1.9 (L) 10/24/2011   LABURIC 4.7 10/17/2006    Body mass index is 25.1 kg/m.  Orders:  No orders of the defined types were placed in this encounter.  No orders of the defined types were placed in this encounter.    Procedures: No procedures performed  Clinical Data: No additional findings.  ROS:  All other systems negative, except as noted in the HPI. Review of Systems  Objective: Vital Signs: Ht 5\' 9"  (1.753 m)   Wt 170 lb (77.1 kg)   BMI 25.10 kg/m   Specialty Comments:  No specialty comments available.  PMFS History: Patient Active Problem List   Diagnosis Date Noted  . PCO (posterior capsular opacification), bilateral 05/26/2017  . Weakness of right leg 04/20/2017  . 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 Exudative age-related macular degeneration of right eye with active choroidal neovascularization ([place]) 09/30/2016  . Pseudophakia of both eyes 09/30/2016  . Impingement syndrome of right shoulder 06/16/2016  . Impingement syndrome of left shoulder 06/16/2016  . Bilateral leg edema 06/14/2016  . Dizziness and giddiness 06/26/2015  . Abnormality of gait 06/26/2015  . Anxiety 04/14/2015  . GERD (gastroesophageal reflux disease) 04/03/2015  . OSA (obstructive sleep apnea) 04/03/2015  .  HLD (hyperlipidemia) 04/03/2015  . SOB (shortness of breath)   . Left elbow fracture 03/29/2015  . Dizziness 01/01/2015  . LBP (low back pain) 01/01/2015  . Malignant neoplasm of prostate ([place]) 01/01/2015  . BP (high blood pressure) 01/01/2015  . DOE (dyspnea on exertion) 10/26/2013  . Essential hypertension, benign 10/26/2013  . Post-traumatic wound infection 10/17/2012  . Cellulitis 10/17/2012  . Atrial fibrillation ([place]) 10/22/2011  . DVT, lower extremity, distal, chronic ([place]) 10/22/2011  . Pacemaker  10/19/2011  . Prostate cancer ([place]) 10/19/2011  . COPD (chronic obstructive pulmonary disease) with emphysema ([place]) 02/04/2011  . Anemia 12/10/2010   Past Medical History:  Diagnosis Date  . Abnormality of gait    due to low back pain , uses  cane and walker  . Arthritis   . Bilateral lower extremity edema    wears TED hose  . Cancer [facility])    prostate- treated with radiation  . Cardiac pacemaker in situ 10/11/2006   medtronic  . Chronic iron deficiency anemia oncologist/ hematoloist-- dr Alen Blew   treatment IV Iron infusions  . Chronic low back pain   . CKD (chronic kidney disease), stage III ([place])   . COPD with emphysema [facility])    pulmologist-  dr Elsworth Soho  . DDD (degenerative disc disease), lumbosacral   . Degenerative scoliosis   . Dyspnea    with exertion  . Full dentures   . GERD (gastroesophageal reflux disease)   . Gross hematuria   . Headache    history of migraines- "way in the past"  . Hereditary and idiopathic peripheral neuropathy    bilateral lower leg  . Hiatal hernia   . History of DVT of lower extremity 10/19/2011   chronic distal popliteal right lower extremity  . History of external beam radiation therapy 2008   prostate cancer  . History of pancreatitis 2013  . History of pneumothorax 09/2006   iatrogenic-- in setting cardiac pacemaker placement  . History of prostate cancer 2008   s/p  external radiation therapy  . Hypertension   . Lesion of bladder   . Macular degeneration of right eye   . Nocturia   . OSA on CPAP   . PAF (paroxysmal atrial fibrillation) ([place]) 10/2011   1st episode documented 09/ 2013 admission for gallstons/ pancreatitis and prior to cholecystectomy , went back in NSR without interventeion  . Presence of permanent cardiac pacemaker   . Sinus node dysfunction ([facility])    s/p  cardiac pacemaker placement 10-11-2006  . Wears glasses   . Wears hearing aid in both ears     Family History  Problem Relation Age of Onset  . Heart disease  Father   . Heart disease Mother   . Colon cancer Mother   . Stroke Mother   . Cancer Sister   . Heart disease Sister   . Heart attack Neg Hx   . Hypertension Neg Hx     Past Surgical History:  Procedure Laterality Date  . BALLOON DILATION N/A 01/14/2015   Procedure: BALLOON DILATION;  Surgeon: Garlan Fair, MD;  Location: [facility];  Service: Endoscopy;  Laterality: N/A;  . BALLOON DILATION N/A 10/12/2017   Procedure: BALLOON DILATION;  Surgeon: Otis Brace, MD;  Location: [facility];  Service: Gastroenterology;  Laterality: N/A;  . BIOPSY  10/12/2017   Procedure: BIOPSY;  Surgeon: Otis Brace, MD;  Location: [facility];  Service: Gastroenterology;;  . CARDIAC CATHETERIZATION  08-29-2000   [place]   no significant  obstructive CAD, intact globle LV size and systolic function with regional wall motion abnormalities noted,  ?coronary spasm producing wall motion abnormality, elevated CPK and chest pain (ef 55%)  . CARDIAC PACEMAKER PLACEMENT  10-11-2006    dr Leonia Reeves   W/  ATRIAL LEAD REVISION 10-12-2006   (medtronic)  . CARDIOVASCULAR STRESS TEST  11-18-2011    dr Irish Lack   Low risk nuclear study w/ no ishemia/  normal wall motion,  post-stress ef 48% (LVEF 56% on prior study 2008)  . CARPAL TUNNEL RELEASE Left 11/14/2014   Procedure: CARPAL TUNNEL RELEASE LEFT THUMB;  Surgeon: Daryll Brod, MD;  Location: [facility];  Service: Orthopedics;  Laterality: Left;  ANESTHESIA:  IV REGIONAL FAB  . CATARACT EXTRACTION W/ INTRAOCULAR LENS  IMPLANT, BILATERAL    . CHOLECYSTECTOMY  10/23/2011  . CHOLECYSTECTOMY  10/23/2011   Procedure: CHOLECYSTECTOMY;  Surgeon: Rolm Bookbinder, MD;  Location: [facility];  Service: General;  Laterality: N/A;  with intraoperative cholangiogram  . COLONOSCOPY    . ESOPHAGOGASTRODUODENOSCOPY (EGD) WITH PROPOFOL N/A 01/14/2015   Procedure: ESOPHAGOGASTRODUODENOSCOPY (EGD) WITH PROPOFOL;  Surgeon: Garlan Fair, MD;  Location: [facility];   Service: Endoscopy;  Laterality: N/A;  . ESOPHAGOGASTRODUODENOSCOPY (EGD) WITH PROPOFOL N/A 10/12/2017   Procedure: ESOPHAGOGASTRODUODENOSCOPY (EGD) WITH PROPOFOL;  Surgeon: Otis Brace, MD;  Location: [facility];  Service: Gastroenterology;  Laterality: N/A;  . I&D EXTREMITY Left 10/20/2012   Procedure: LEFT LEG IRRIGATION AND DEBRIDEMENT, AND WOUND VAC APPLICATION;  Surgeon: Marianna Payment, MD;  Location: [facility];  Service: Orthopedics;  Laterality: Left;  . I&D EXTREMITY Left 10/22/2012   Procedure: IRRIGATION AND DEBRIDEMENT EXTREMITY;  Surgeon: Marianna Payment, MD;  [facility];  Service: Orthopedics;  Laterality: Left;  . INGUINAL HERNIA REPAIR Left yrs ago  . LAPAROSCOPIC ASSISTED VENTRAL HERNIA REPAIR  08-08-2009   dr Excell Seltzer   AND OPEN REPAIR RECURRENT LEFT INGUINAL HERNIA  . LAPAROSCOPIC INGUINAL HERNIA REPAIR Left 07-24-1999    dr Excell Seltzer  . OPEN VENTRAL HERNIA REPAIR /  LYSIS ADHESIONS  09-23-2010    dr Donne Hazel  . ORIF ELBOW FRACTURE Left 03/29/2015   Procedure: LEFT ELBOW OPEN REDUCTION INTERNAL FIXATION (ORIF) DISTAL HUMERUS FRACTURE WITH OLECRANON OSTEOTOMY AND ULNAR NERVE RELEASE;  Surgeon: Roseanne Kaufman, MD;  Location: [facility];  Service: Orthopedics;  Laterality: Left;  . PPM GENERATOR CHANGEOUT N/A 02/10/2018   Procedure: PPM GENERATOR CHANGEOUT;  Surgeon: Deboraha Sprang, MD;  Location: [facility];  Service: Cardiovascular;  Laterality: N/A;  . REPAIR SUPRAUMBILICAL HERNIA   [id number]   dr Excell Seltzer  . SHOULDER ARTHROSCOPY WITH DISTAL CLAVICLE RESECTION Right 11-07-2007   dr Rhona Raider   [place] ACROMIOPLASTY  . SKIN SPLIT GRAFT Left 10/22/2012   Procedure: SKIN GRAFT SPLIT THICKNESS;  Surgeon: Marianna Payment, MD;  Location[facility];  Service: Orthopedics;  Laterality: Left;  . TOTAL KNEE ARTHROPLASTY Right 1990's  . TRANSTHORACIC ECHOCARDIOGRAM  06-12-2012   dr Irish Lack   ef 50-55%/ mild LAE/ mild MR and TR/  AV sclerosis without  stenosis/  RVSP 87mmHg  . TRANSURETHRAL RESECTION OF BLADDER TUMOR N/A 07/15/2017   Procedure: CYSTOSCOPY TRANSURETHRAL RESECTION OF BLADDER TUMOR (TURBT);  Surgeon: Kathie Rhodes, MD;  Location[facility];  Service: Urology;  Laterality: N/A;   Social History   Occupational History  . 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  Smoking status: Former Smoker    Packs/day: 1.50    Years: 35.00    Pack years: 52.50    Types: Cigarettes    Last attempt to quit: 02/15/1973    Years since quitting: 45.1  . Smokeless tobacco: Never Used  Substance and Sexual Activity  . Alcohol use: No  . Drug use: No  . Sexual activity: Never

## 2018-03-20 DIAGNOSIS — N3941 Urge incontinence: Secondary | ICD-10-CM | POA: Diagnosis not present

## 2018-03-20 DIAGNOSIS — R351 Nocturia: Secondary | ICD-10-CM | POA: Diagnosis not present

## 2018-03-20 DIAGNOSIS — N3281 Overactive bladder: Secondary | ICD-10-CM | POA: Diagnosis not present

## 2018-03-23 DIAGNOSIS — R159 Full incontinence of feces: Secondary | ICD-10-CM | POA: Diagnosis not present

## 2018-03-23 DIAGNOSIS — R634 Abnormal weight loss: Secondary | ICD-10-CM | POA: Diagnosis not present

## 2018-03-25 ENCOUNTER — Other Ambulatory Visit: Payer: Self-pay | Admitting: Gastroenterology

## 2018-03-25 DIAGNOSIS — R634 Abnormal weight loss: Secondary | ICD-10-CM

## 2018-04-04 ENCOUNTER — Ambulatory Visit
Admission: RE | Admit: 2018-04-04 | Discharge: 2018-04-04 | Disposition: A | Discharge status: Home / Self Care | Payer: Medicare Other | Source: Ambulatory Visit | Attending: Gastroenterology | Admitting: Gastroenterology

## 2018-04-04 DIAGNOSIS — N401 Enlarged prostate with lower urinary tract symptoms: Secondary | ICD-10-CM | POA: Diagnosis not present

## 2018-04-04 DIAGNOSIS — K449 Diaphragmatic hernia without obstruction or gangrene: Secondary | ICD-10-CM | POA: Diagnosis not present

## 2018-04-04 DIAGNOSIS — R634 Abnormal weight loss: Secondary | ICD-10-CM

## 2018-04-04 MED ORDER — IOPAMIDOL (ISOVUE-300) INJECTION 61%
100.0000 mL | Freq: Once | INTRAVENOUS | Status: AC | PRN
  Administered 2018-04-04: 100 mL via INTRAVENOUS

## 2018-04-09 NOTE — Progress Notes (Signed)
@Patient  ID: [name] [name], male    DOB: April 23, 1924, 85 y.o.   MRN: [id number]  Chief Complaint  Patient presents with  . Follow-up    OSA and COPD follow up     Referring provider: [name], MD  HPI:  85 year old male followed in our office for COPD and obstructive sleep apnea currently managed on CPAP  PMH: A. fib, GERD, DVT, hypertension, prostate cancer Smoker/ Smoking History: Former Smoker.  Quit in 1975.  52.5-pack-year smoking history Maintenance: Anoro Ellipta Pt of: [name]  04/10/2018  - Visit   85 year old male former smoker followed in our office for obstructive sleep apnea as well as COPD.  Patient was last seen in our office in November/2019.  Patient has finally received his new CPAP which he is used for 1 day.  Patient reports no issues.  Patient reports that he has been adherent to CPAP use since last office visit.  Patient also reports adherence to using his Anoro Ellipta.  Patient reports uses rescue inhaler maybe 1 time a week.  MMRC - Breathlessness Score 3 - I stop for breath after walking about 100 yards or after a few minutes on level ground (isle at grocery store is 131ft)     Tests:  Spiro 2015: FEV1 1.92 (63%), ratio 55   FENO:  No results found for: NITRICOXIDE  PFT: No flowsheet data found.  Imaging: Ct Abdomen Pelvis W Contrast  Result Date: 04/05/2018 CLINICAL DATA:  Weight loss. History of prostate and bladder cancer. EXAM: CT ABDOMEN AND PELVIS WITH CONTRAST TECHNIQUE: Multidetector CT imaging of the abdomen and pelvis was performed using the standard protocol following bolus administration of intravenous contrast. CONTRAST:  136mL ISOVUE-300 IOPAMIDOL (ISOVUE-300) INJECTION 61% COMPARISON:  06/07/2017 CT abdomen/pelvis. FINDINGS: Lower chest: Subpleural lingular 3 mm solid pulmonary nodule (series 6/image 17) is stable since 02/11/2017 CT, considered benign. Small dependent bilateral pleural effusions, right greater than left.  Pacemaker leads are seen in the right atrium and right ventricle. Hepatobiliary: Normal liver size. Scattered granulomatous liver calcifications. No liver masses. Cholecystectomy. Bile ducts are stable and within normal post cholecystectomy limits. Pancreas: Normal, with no mass or duct dilation. Spleen: Normal size spleen. Granulomatous splenic calcifications. No splenic mass. Adrenals/Urinary Tract: Normal adrenals. No hydronephrosis. Simple 2.1 cm interpolar left renal cyst. Bladder is nondistended, limiting evaluation, with no gross bladder abnormality. Stomach/Bowel: Small to moderate hiatal hernia. Otherwise normal nondistended stomach. Normal caliber small bowel with no small bowel wall thickening. Appendix not discretely visualized. No pericecal inflammatory change. Normal large bowel with no diverticulosis, large bowel wall thickening or pericolonic fat stranding. Vascular/Lymphatic: Atherosclerotic nonaneurysmal abdominal aorta. Patent portal, splenic, hepatic and renal veins. No pathologically enlarged lymph nodes in the abdomen or pelvis. Reproductive: Mildly enlarged prostate with internal radiation fiducial markers. Other: No pneumoperitoneum, ascites or focal fluid collection. Musculoskeletal: There is new patchy sclerosis in lower sacrum, which appears somewhat horizontally oriented on the coronal reconstructions (series 4/image 88). No new focal osseous lesions. Marked thoracolumbar spondylosis. IMPRESSION: 1. New patchy sclerosis in the lower sacrum, somewhat horizontally oriented, nonspecific, favor an insufficiency fracture. MRI of the pelvis could be obtained for further characterization as clinically warranted. 2. No lymphadenopathy or other findings highly suspicious for metastatic disease. 3. Small dependent bilateral pleural effusions, right greater than left. 4. Small to moderate hiatal hernia. 5. Mild prostatomegaly. 6. Aortic Atherosclerosis (ICD10-I70.0). Electronically Signed   By:  Ilona Sorrel M.D.   On: 04/05/2018 08:30  Specialty Problems      Pulmonary Problems   COPD (chronic obstructive pulmonary disease) with emphysema ([place])    PFT's 2013:  FEV1 2.13 (85%), ratio 52, nl TLC, normal DLCO Spiro 2014:  FEV1 1.63 (55%), ratio 51 No change with LABA/ICS Spiro 2015:  FEV1 1.92 (63%), ratio 55       DOE (dyspnea on exertion)   SOB (shortness of breath)   OSA (obstructive sleep apnea)    [employer]         No Known Allergies  Immunization History  Administered Date(s) Administered  . Influenza Split 11/02/2015  . Influenza, High Dose Seasonal PF 11/14/2016, 11/07/2017  . Influenza,inj,Quad PF,6+ Mos 10/16/2012  . Influenza-Unspecified 11/15/2013  . Pneumococcal Conjugate-13 06/03/2014  . Pneumococcal-Unspecified 05/04/2013  . Tdap 10/05/2012  . Zoster 02/16/2011  . Zoster Recombinat (Shingrix) 04/15/2017, 06/15/2017    Past Medical History:  Diagnosis Date  . Abnormality of gait    due to low back pain , uses  cane and walker  . Arthritis   . Bilateral lower extremity edema    wears TED hose  . [facility])    prostate- treated with radiation  . Cardiac pacemaker in situ 10/11/2006   medtronic  . Chronic iron deficiency anemia oncologist/ hematoloist-- dr Alen Blew   treatment IV Iron infusions  . Chronic low back pain   . CKD (chronic kidney disease), stage III ([place])   . COPD with emphysema [facility])    pulmologist-  dr Elsworth Soho  . DDD (degenerative disc disease), lumbosacral   . Degenerative scoliosis   . Dyspnea    with exertion  . Full dentures   . GERD (gastroesophageal reflux disease)   . Gross hematuria   . Headache    history of migraines- "way in the past"  . Hereditary and idiopathic peripheral neuropathy    bilateral lower leg  . Hiatal hernia   . History of DVT of lower extremity 10/19/2011   chronic distal popliteal right lower extremity  . History of external beam radiation therapy 2008   prostate cancer  . History of  pancreatitis 2013  . History of pneumothorax 09/2006   iatrogenic-- in setting cardiac pacemaker placement  . History of prostate cancer 2008   s/p  external radiation therapy  . Hypertension   . Lesion of bladder   . Macular degeneration of right eye   . Nocturia   . OSA on CPAP   . PAF (paroxysmal atrial fibrillation) ([place]) 10/2011   1st episode documented 09/ 2013 admission for gallstons/ pancreatitis and prior to cholecystectomy , went back in NSR without interventeion  . Presence of permanent cardiac pacemaker   . Sinus node dysfunction ([facility])    s/p  cardiac pacemaker placement 10-11-2006  . Wears glasses   . Wears hearing aid in both ears     Tobacco History: Social History   Tobacco Use  Smoking Status Former Smoker  . Packs/day: 1.50  . Years: 35.00  . Pack years: 52.50  . Types: Cigarettes  . Last attempt to quit: 02/15/1973  . Years since quitting: 45.1  Smokeless Tobacco Never Used   Counseling given: Yes  Continue to not smoke  Outpatient Encounter Medications as of 04/10/2018  Medication Sig  . acetaminophen (TYLENOL) 650 MG CR tablet Take 1,300 mg by mouth 2 (two) times daily.   Marland Kitchen albuterol (PROVENTIL HFA;VENTOLIN HFA) 108 (90 Base) MCG/ACT inhaler Inhale 2 puffs into the lungs every 6 (six) hours as needed for wheezing  or shortness of breath.  Marland Kitchen aspirin EC 81 MG tablet Take 81 mg by mouth daily.   . Calcium Carbonate-Vitamin D (CALCIUM-D PO) Take 1 tablet by mouth daily.   Marland Kitchen denosumab (PROLIA) 60 MG/ML SOSY injection Inject 60 mg into the skin every 6 (six) months.  . diphenhydrAMINE (BENADRYL) 25 MG tablet Take 25 mg by mouth daily as needed for allergies.   . fluticasone (FLONASE) 50 MCG/ACT nasal spray Place 1 spray into both nostrils daily as needed for allergies or rhinitis.  . furosemide (LASIX) 20 MG tablet Take 20-40 mg by mouth See admin instructions. Alternate taking 20 mg and 40 mg daily  . lisinopril (PRINIVIL,ZESTRIL) 10 MG tablet Take 10 mg by  mouth daily.   . Multiple Vitamins-Minerals (PRESERVISION AREDS PO) Take 1 tablet by mouth 2 (two) times daily.  Marland Kitchen oxybutynin (DITROPAN XL) 15 MG 24 hr tablet Take 15 mg by mouth every other day.   . oxyCODONE (OXY IR/ROXICODONE) 5 MG immediate release tablet Take 5 mg by mouth every 6 (six) hours as needed for severe pain.  Marland Kitchen oxymetazoline (AFRIN) 0.05 % nasal spray Place 1 spray into both nostrils 2 (two) times daily as needed for congestion.  . pantoprazole (PROTONIX) 40 MG tablet Take 1 tablet (40 mg total) by mouth daily before breakfast.  . predniSONE (DELTASONE) 10 MG tablet TAKE ONE TABLET BY MOUTH EVERY MORNING  . Umeclidinium-Vilanterol (ANORO ELLIPTA) 62.5-25 MCG/INH AEPB Inhale 1 puff into the lungs daily.   . vitamin B-12 (CYANOCOBALAMIN) 500 MCG tablet Take 500 mcg by mouth every Monday, Wednesday, and Friday.    No facility-administered encounter medications on file as of 04/10/2018.      Review of Systems  Review of Systems  Constitutional: Positive for fatigue (Baseline: With exertion). Negative for activity change, chills, fever and unexpected weight change.  HENT: Positive for sinus pain. Negative for postnasal drip, sneezing and sore throat.   Eyes: Negative.   Respiratory: Positive for shortness of breath (Baseline: With exertion). Negative for cough and wheezing.   Cardiovascular: Negative for chest pain and palpitations.  Gastrointestinal: Negative for constipation, diarrhea, nausea and vomiting.  Endocrine: Negative.   Musculoskeletal: Positive for gait problem (Uses walker).  Skin: Negative.   Neurological: Negative for dizziness and headaches.  Psychiatric/Behavioral: Negative.  Negative for dysphoric mood. The patient is not nervous/anxious.   All other systems reviewed and are negative.    Physical Exam  BP 128/62 (BP Location: Left Arm, Cuff Size: Normal)   Pulse 92   Ht 5\' 9"  (1.753 m)   Wt 165 lb (74.8 kg)   SpO2 96%   BMI 24.37 kg/m   Wt  Readings from Last 5 Encounters:  04/10/18 165 lb (74.8 kg)  03/15/18 170 lb (77.1 kg)  02/10/18 170 lb (77.1 kg)  02/02/18 176 lb (79.8 kg)  01/26/18 176 lb (79.8 kg)     Physical Exam  Constitutional: He is oriented to person, place, and time and well-developed, well-nourished, and in no distress. No distress.  Elderly male  HENT:  Head: Normocephalic and atraumatic.  Right Ear: Hearing and external ear normal.  Left Ear: Hearing and external ear normal.  Nose: Nose normal. Right sinus exhibits no maxillary sinus tenderness and no frontal sinus tenderness. Left sinus exhibits no maxillary sinus tenderness and no frontal sinus tenderness.  Mouth/Throat: Uvula is midline and oropharynx is clear and moist. He has dentures. No oropharyngeal exudate.  Hearing aids bilaterally  Eyes: Pupils are equal, round,  and reactive to light.  Neck: Normal range of motion. Neck supple.  Cardiovascular: Normal rate, regular rhythm and normal heart sounds.  Pulmonary/Chest: Effort normal and breath sounds normal. No accessory muscle usage. No respiratory distress. He has no decreased breath sounds. He has no wheezes. He has no rhonchi. He has no rales.  Musculoskeletal: Normal range of motion.        General: No edema.  Lymphadenopathy:    He has no cervical adenopathy.  Neurological: He is alert and oriented to person, place, and time.  Walks with walker  Skin: Skin is warm and dry. He is not diaphoretic. No erythema.  Psychiatric: Mood, memory, affect and judgment normal.  Nursing note and vitals reviewed.     Lab Results:  CBC    Component Value Date/Time   WBC 7.4 05/17/2017 1112   RBC 3.35 (L) 05/17/2017 1112   HGB 10.9 (L) 02/10/2018 1322   HGB 10.3 (L) 11/16/2016 1118   HCT 32.0 (L) 02/10/2018 1322   HCT 32.5 (L) 11/16/2016 1118   PLT 148 05/17/2017 1112   PLT 127 (L) 11/16/2016 1118   MCV 99.3 (H) 05/17/2017 1112   MCV 95.6 11/16/2016 1118   MCH 32.8 05/17/2017 1112   MCHC  33.0 05/17/2017 1112   RDW 15.1 (H) 05/17/2017 1112   RDW 13.7 11/16/2016 1118   LYMPHSABS 0.9 05/17/2017 1112   LYMPHSABS 1.4 11/16/2016 1118   MONOABS 0.4 05/17/2017 1112   MONOABS 0.3 11/16/2016 1118   EOSABS 0.3 05/17/2017 1112   EOSABS 0.2 11/16/2016 1118   BASOSABS 0.1 05/17/2017 1112   BASOSABS 0.0 11/16/2016 1118    BMET    Component Value Date/Time   NA 137 02/10/2018 1322   NA 143 05/18/2016 1254   K 4.1 02/10/2018 1322   K 4.1 05/18/2016 1254   CL 105 02/10/2018 1322   CO2 24 05/18/2016 1254   GLUCOSE 88 02/10/2018 1322   GLUCOSE 102 05/18/2016 1254   BUN 25 (H) 02/10/2018 1322   BUN 36.4 (H) 05/18/2016 1254   CREATININE 1.10 02/10/2018 1322   CREATININE 1.4 (H) 05/18/2016 1254   CALCIUM 9.3 05/18/2016 1254   GFRNONAA 59 (L) 03/31/2015 0357   GFRAA >60 03/31/2015 0357    BNP No results found for: BNP  ProBNP    Component Value Date/Time   PROBNP 1,315.0 (H) 10/23/2012 0419      Assessment & Plan:     OSA (obstructive sleep apnea) Assessment: Patient is finally received a new CPAP Patient reports continued CPAP use  Plan: Continue daily CPAP use We will place an order for 1 month download to check compliance Keep follow-up with [name] in April/2020  COPD (chronic obstructive pulmonary disease) with emphysema ([place]) Assessment: mMRC 3 today Maintained well on Anoro Ellipta Lungs clear to auscultation today  Plan: Continue Anoro Ellipta Can use rescue inhaler as needed Present to our office sooner if you have worsening shortness of breath or wheezing Keep scheduled follow-up in April/2020 with Dr. Daine Gravel, NP 04/10/2018   This appointment was 27 min long with over 50% of the time in direct face-to-face patient care, assessment, plan of care, and follow-up.

## 2018-04-10 ENCOUNTER — Encounter: Payer: Self-pay | Admitting: Pulmonary Disease

## 2018-04-10 ENCOUNTER — Ambulatory Visit (INDEPENDENT_AMBULATORY_CARE_PROVIDER_SITE_OTHER): Payer: Medicare Other | Admitting: Pulmonary Disease

## 2018-04-10 VITALS — BP 128/62 | HR 92 | Ht 69.0 in | Wt 165.0 lb

## 2018-04-10 DIAGNOSIS — G4733 Obstructive sleep apnea (adult) (pediatric): Secondary | ICD-10-CM | POA: Diagnosis not present

## 2018-04-10 DIAGNOSIS — J432 Centrilobular emphysema: Secondary | ICD-10-CM

## 2018-04-10 NOTE — Assessment & Plan Note (Signed)
Assessment: Patient is finally received a new CPAP Patient reports continued CPAP use  Plan: Continue daily CPAP use We will place an order for 1 month download to check compliance Keep follow-up with [name] in April/2020

## 2018-04-10 NOTE — Patient Instructions (Addendum)
Anoro Ellipta  >>> Take 1 puff daily in the morning right when you wake up >>>Rinse your mouth out after use >>>This is a daily maintenance inhaler, NOT a rescue inhaler >>>Contact our office if you are having difficulties affording or obtaining this medication >>>It is important for you to be able to take this daily and not miss any doses   Only use your albuterol as a rescue medication to be used if you can't catch your breath by resting or doing a relaxed purse lip breathing pattern.  - The less you use it, the better it will work when you need it. - Ok to use up to 2 puffs  every 4 hours if you must but call for immediate appointment if use goes up over your usual need - Don't leave home without it !!  (think of it like the spare tire for your car)    Note your daily symptoms > remember "red flags" for COPD:   >>>Increase in cough >>>increase in sputum production >>>increase in shortness of breath or activity  intolerance.   If you notice these symptoms, please call the office to be seen.     We recommend that you continue using your CPAP daily >>>Keep up the hard work using your device >>> Goal should be wearing this for the entire night that you are sleeping, at least 4 to 6 hours  Remember:  . Do not drive or operate heavy machinery if tired or drowsy.  . Please notify the supply company and office if you are unable to use your device regularly due to missing supplies or machine being broken.  . Work on maintaining a healthy weight and following your recommended nutrition plan  . Maintain proper daily exercise and movement  . Maintaining proper use of your device can also help improve management of other chronic illnesses such as: Blood pressure, blood sugars, and weight management.   BiPAP/ CPAP Cleaning:  >>>Clean weekly, with Dawn soap, and bottle brush.  Set up to air dry.   Follow up with Dr Elsworth Soho in April/2020   It is flu season:   >>> Best ways to protect herself  from the flu: Receive the yearly flu vaccine, practice good hand hygiene washing with soap and also using hand sanitizer when available, eat a nutritious meals, get adequate rest, hydrate appropriately   Please contact the office if your symptoms worsen or you have concerns that you are not improving.   Thank you for choosing [facility] for your healthcare, and for allowing [place] to partner with you on your healthcare journey. I am thankful to be able to provide care to you today.   Wyn Quaker FNP-C    CPAP and BPAP Information CPAP and BPAP are methods of helping a person breathe with the use of air pressure. CPAP stands for "continuous positive airway pressure." BPAP stands for "bi-level positive airway pressure." In both methods, air is blown through your nose or mouth and into your air passages to help you breathe well. CPAP and BPAP use different amounts of pressure to blow air. With CPAP, the amount of pressure stays the same while you breathe in and out. With BPAP, the amount of pressure is increased when you breathe in (inhale) so that you can take larger breaths. Your health care provider will recommend whether CPAP or BPAP would be more helpful for you. Why are CPAP and BPAP treatments used? CPAP or BPAP can be helpful if you have:  Sleep apnea.  Chronic obstructive pulmonary disease (COPD).  Heart failure.  Medical conditions that weaken the muscles of the chest including muscular dystrophy, or neurological diseases such as amyotrophic lateral sclerosis (ALS).  Other problems that cause breathing to be weak, abnormal, or difficult. CPAP is most commonly used for obstructive sleep apnea (OSA) to keep the airways from collapsing when the muscles relax during sleep. How is CPAP or BPAP administered? Both CPAP and BPAP are provided by a small machine with a flexible plastic tube that attaches to a plastic mask. You wear the mask. Air is blown through the mask into your  nose or mouth. The amount of pressure that is used to blow the air can be adjusted on the machine. Your health care provider will determine the pressure setting that should be used based on your individual needs. When should CPAP or BPAP be used? In most cases, the mask only needs to be worn during sleep. Generally, the mask needs to be worn throughout the night and during any daytime naps. People with certain medical conditions may also need to wear the mask at other times when they are awake. Follow instructions from your health care provider about when to use the machine. What are some tips for using the mask?   Because the mask needs to be snug, some people feel trapped or closed-in (claustrophobic) when first using the mask. If you feel this way, you may need to get used to the mask. One way to do this is by holding the mask loosely over your nose or mouth and then gradually applying the mask more snugly. You can also gradually increase the amount of time that you use the mask.  Masks are available in various types and sizes. Some fit over your mouth and nose while others fit over just your nose. If your mask does not fit well, talk with your health care provider about getting a different one.  If you are using a mask that fits over your nose and you tend to breathe through your mouth, a chin strap may be applied to help keep your mouth closed.  The CPAP and BPAP machines have alarms that may sound if the mask comes off or develops a leak.  If you have trouble with the mask, it is very important that you talk with your health care provider about finding a way to make the mask easier to tolerate. Do not stop using the mask. Stopping the use of the mask could have a negative impact on your health. What are some tips for using the machine?  Place your CPAP or BPAP machine on a secure table or stand near an electrical outlet.  Know where the on/off switch is located on the machine.  Follow  instructions from your health care provider about how to set the pressure on your machine and when you should use it.  Do not eat or drink while the CPAP or BPAP machine is on. Food or fluids could get pushed into your lungs by the pressure of the CPAP or BPAP.  Do not smoke. Tobacco smoke residue can damage the machine.  For home use, CPAP and BPAP machines can be rented or purchased through home health care companies. Many different brands of machines are available. Renting a machine before purchasing may help you find out which particular machine works well for you.  Keep the CPAP or BPAP machine and attachments clean. Ask your health care provider for specific instructions. Get help  right away if:  You have redness or open areas around your nose or mouth where the mask fits.  You have trouble using the CPAP or BPAP machine.  You cannot tolerate wearing the CPAP or BPAP mask.  You have pain, discomfort, and bloating in your abdomen. Summary  CPAP and BPAP are methods of helping a person breathe with the use of air pressure.  Both CPAP and BPAP are provided by a small machine with a flexible plastic tube that attaches to a plastic mask.  If you have trouble with the mask, it is very important that you talk with your health care provider about finding a way to make the mask easier to tolerate. This information is not intended to replace advice given to you by your health care provider. Make sure you discuss any questions you have with your health care provider. Document Released: 10/31/2003 Document Revised: 10/04/2017 Document Reviewed: 12/22/2015 Elsevier Interactive Patient Education  2019 [employer].

## 2018-04-10 NOTE — Assessment & Plan Note (Signed)
Assessment: mMRC 3 today Maintained well on Anoro Ellipta Lungs clear to auscultation today  Plan: Continue Anoro Ellipta Can use rescue inhaler as needed Present to our office sooner if you have worsening shortness of breath or wheezing Keep scheduled follow-up in April/2020 with [name]

## 2018-04-12 ENCOUNTER — Telehealth (INDEPENDENT_AMBULATORY_CARE_PROVIDER_SITE_OTHER): Payer: Self-pay | Admitting: Orthopedic Surgery

## 2018-04-12 NOTE — Telephone Encounter (Signed)
Ok, pt was called but could not remember what was needed.

## 2018-04-12 NOTE — Telephone Encounter (Signed)
Patient left a message late yesterday stating that he was returning a call to our office.  I looked in the chart and did not see anything.  CB#[id number].  Thank you.

## 2018-04-13 ENCOUNTER — Encounter (INDEPENDENT_AMBULATORY_CARE_PROVIDER_SITE_OTHER): Payer: Self-pay | Admitting: Orthopedic Surgery

## 2018-04-13 ENCOUNTER — Ambulatory Visit (INDEPENDENT_AMBULATORY_CARE_PROVIDER_SITE_OTHER): Payer: Medicare Other | Admitting: Orthopedic Surgery

## 2018-04-13 VITALS — Ht 69.0 in | Wt 165.0 lb

## 2018-04-13 DIAGNOSIS — S3210XA Unspecified fracture of sacrum, initial encounter for closed fracture: Secondary | ICD-10-CM | POA: Insufficient documentation

## 2018-04-13 DIAGNOSIS — S32110A Nondisplaced Zone I fracture of sacrum, initial encounter for closed fracture: Secondary | ICD-10-CM | POA: Diagnosis not present

## 2018-04-13 NOTE — Progress Notes (Signed)
Office Visit Note   Patient: [name] [name]           Date of Birth: 1924/07/08           MRN: [id number] Visit Date: 04/13/2018              Requested by: [name], MD [place], [place] [address] PCP: [name], MD  Chief Complaint  Patient presents with  . Lower Back - Follow-up, Pain  . Left Hip - Follow-up, Pain  . Right Hip - Follow-up, Pain      HPI: Patient presents in follow-up for consultation regarding a sacral injury.  Patient states that he fell a while ago directly on his buttocks has been having pain since that time he did have a CT scan which showed a sacral stress fracture.  Patient is currently on prednisone 10 mg a day.  Patient states he is still wearing compression for his venous stasis insufficiency and still has problems with his onychomycotic nails.  Assessment & Plan: Visit Diagnoses:  1. Closed nondisplaced zone I fracture of sacrum, initial encounter ([place])     Plan: Recommended follow-up in 2 months for evaluation of the nails and venous insufficiency.  Recommended prednisone 20 mg a day with breakfast until symptoms resolved and then resume back to 10 mg a day.  Follow-Up Instructions: Return in about 2 months (around 06/12/2018).   Ortho Exam  Patient is alert, oriented, no adenopathy, well-dressed, normal affect, normal respiratory effort. Examination patient has difficulty getting from a sitting to a standing position.  He has weakness in upper and lower extremities.  He does have venous stasis swelling but no open ulcers.  He has onychomycotic nails but no signs of infection.  Patient has pain to palpation over the sacrum.  Review of the CT scan shows a sacral stress fracture from his recent fall.  Imaging: No results found. No images are attached to the encounter.  Labs: Lab Results  Component Value Date   CRP 16.2 (H) 10/20/2011   LABURIC 4.7 10/17/2006   REPTSTATUS 03/31/2015 FINAL 03/30/2015   GRAMSTAIN  Abundant 12/18/2015   GRAMSTAIN WBC present-predominately PMN 12/18/2015   GRAMSTAIN No Squamous Epithelial Cells Seen 12/18/2015   GRAMSTAIN No Organisms Seen 12/18/2015   CULT 9,000 COLONIES/mL INSIGNIFICANT GROWTH 03/30/2015   LABORGA NORMAL SKIN FLORA 12/18/2015     Lab Results  Component Value Date   ALBUMIN 4.1 05/18/2016   ALBUMIN 3.2 (L) 10/17/2012   ALBUMIN 1.9 (L) 10/24/2011   LABURIC 4.7 10/17/2006    Body mass index is 24.37 kg/m.  Orders:  No orders of the defined types were placed in this encounter.  No orders of the defined types were placed in this encounter.    Procedures: No procedures performed  Clinical Data: No additional findings.  ROS:  All other systems negative, except as noted in the HPI. Review of Systems  Objective: Vital Signs: Ht 5\' 9"  (1.753 m)   Wt 165 lb (74.8 kg)   BMI 24.37 kg/m   Specialty Comments:  No specialty comments available.  PMFS History: Patient Active Problem List   Diagnosis Date Noted  . PCO (posterior capsular opacification), bilateral 05/26/2017  . Weakness of right leg 04/20/2017  . 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 Exudative age-related macular degeneration of right eye with active choroidal neovascularization ([place]) 09/30/2016  . Pseudophakia of both eyes 09/30/2016  . Impingement  syndrome of right shoulder 06/16/2016  . Impingement syndrome of left shoulder 06/16/2016  . Bilateral leg edema 06/14/2016  . Dizziness and giddiness 06/26/2015  . Abnormality of gait 06/26/2015  . Anxiety 04/14/2015  . GERD (gastroesophageal reflux disease) 04/03/2015  . OSA (obstructive sleep apnea) 04/03/2015  . HLD (hyperlipidemia) 04/03/2015  . SOB (shortness of breath)   . Left elbow fracture 03/29/2015  . Dizziness 01/01/2015  . LBP (low back pain) 01/01/2015  . Malignant neoplasm of prostate ([place]) 01/01/2015  . BP (high blood pressure) 01/01/2015  . DOE (dyspnea  on exertion) 10/26/2013  . Essential hypertension, benign 10/26/2013  . Post-traumatic wound infection 10/17/2012  . Cellulitis 10/17/2012  . Atrial fibrillation ([place]) 10/22/2011  . DVT, lower extremity, distal, chronic ([place]) 10/22/2011  . Pacemaker 10/19/2011  . Prostate cancer ([place]) 10/19/2011  . COPD (chronic obstructive pulmonary disease) with emphysema ([place]) 02/04/2011  . Anemia 12/10/2010   Past Medical History:  Diagnosis Date  . Abnormality of gait    due to low back pain , uses  cane and walker  . Arthritis   . Bilateral lower extremity edema    wears TED hose  . Cancer [facility])    prostate- treated with radiation  . Cardiac pacemaker in situ 10/11/2006   medtronic  . Chronic iron deficiency anemia oncologist/ hematoloist-- dr Alen Blew   treatment IV Iron infusions  . Chronic low back pain   . CKD (chronic kidney disease), stage III ([place])   . COPD with emphysema [facility])    pulmologist-  dr Elsworth Soho  . DDD (degenerative disc disease), lumbosacral   . Degenerative scoliosis   . Dyspnea    with exertion  . Full dentures   . GERD (gastroesophageal reflux disease)   . Gross hematuria   . Headache    history of migraines- "way in the past"  . Hereditary and idiopathic peripheral neuropathy    bilateral lower leg  . Hiatal hernia   . History of DVT of lower extremity 10/19/2011   chronic distal popliteal right lower extremity  . History of external beam radiation therapy 2008   prostate cancer  . History of pancreatitis 2013  . History of pneumothorax 09/2006   iatrogenic-- in setting cardiac pacemaker placement  . History of prostate cancer 2008   s/p  external radiation therapy  . Hypertension   . Lesion of bladder   . Macular degeneration of right eye   . Nocturia   . OSA on CPAP   . PAF (paroxysmal atrial fibrillation) ([place]) 10/2011   1st episode documented 09/ 2013 admission for gallstons/ pancreatitis and prior to cholecystectomy , went back in NSR without  interventeion  . Presence of permanent cardiac pacemaker   . Sinus node dysfunction ([facility])    s/p  cardiac pacemaker placement 10-11-2006  . Wears glasses   . Wears hearing aid in both ears     Family History  Problem Relation Age of Onset  . Heart disease Father   . Heart disease Mother   . Colon cancer Mother   . Stroke Mother   . Cancer Sister   . Heart disease Sister   . Heart attack Neg Hx   . Hypertension Neg Hx     Past Surgical History:  Procedure Laterality Date  . BALLOON DILATION N/A 01/14/2015   Procedure: BALLOON DILATION;  Surgeon: Garlan Fair, MD;  Location: [facility];  Service: Endoscopy;  Laterality: N/A;  . BALLOON DILATION N/A 10/12/2017   Procedure:  BALLOON DILATION;  Surgeon: Otis Brace, MD;  Location: [facility];  Service: Gastroenterology;  Laterality: N/A;  . BIOPSY  10/12/2017   Procedure: BIOPSY;  Surgeon: Otis Brace, MD;  Location: [facility];  Service: Gastroenterology;;  . CARDIAC CATHETERIZATION  08-29-2000   [facility]   no significant obstructive CAD, intact globle LV size and systolic function with regional wall motion abnormalities noted,  ?coronary spasm producing wall motion abnormality, elevated CPK and chest pain (ef 55%)  . CARDIAC PACEMAKER PLACEMENT  10-11-2006    dr Leonia Reeves   W/  ATRIAL LEAD REVISION 10-12-2006   (medtronic)  . CARDIOVASCULAR STRESS TEST  11-18-2011    dr Irish Lack   Low risk nuclear study w/ no ishemia/  normal wall motion,  post-stress ef 48% (LVEF 56% on prior study 2008)  . CARPAL TUNNEL RELEASE Left 11/14/2014   Procedure: CARPAL TUNNEL RELEASE LEFT THUMB;  Surgeon: Daryll Brod, MD;  Location: [facility];  Service: Orthopedics;  Laterality: Left;  ANESTHESIA:  IV REGIONAL FAB  . CATARACT EXTRACTION W/ INTRAOCULAR LENS  IMPLANT, BILATERAL    . CHOLECYSTECTOMY  10/23/2011  . CHOLECYSTECTOMY  10/23/2011   Procedure: CHOLECYSTECTOMY;  Surgeon: Rolm Bookbinder, MD;  Location: [facility];  Service:  General;  Laterality: N/A;  with intraoperative cholangiogram  . COLONOSCOPY    . ESOPHAGOGASTRODUODENOSCOPY (EGD) WITH PROPOFOL N/A 01/14/2015   Procedure: ESOPHAGOGASTRODUODENOSCOPY (EGD) WITH PROPOFOL;  Surgeon: Garlan Fair, MD;  Location: [facility];  Service: Endoscopy;  Laterality: N/A;  . ESOPHAGOGASTRODUODENOSCOPY (EGD) WITH PROPOFOL N/A 10/12/2017   Procedure: ESOPHAGOGASTRODUODENOSCOPY (EGD) WITH PROPOFOL;  Surgeon: Otis Brace, MD;  Location: [facility];  Service: Gastroenterology;  Laterality: N/A;  . I&D EXTREMITY Left 10/20/2012   Procedure: LEFT LEG IRRIGATION AND DEBRIDEMENT, AND WOUND VAC APPLICATION;  Surgeon: Marianna Payment, MD;  Location: [facility];  Service: Orthopedics;  Laterality: Left;  . I&D EXTREMITY Left 10/22/2012   Procedure: IRRIGATION AND DEBRIDEMENT EXTREMITY;  Surgeon: Marianna Payment, MD;  Location: [facility];  Service: Orthopedics;  Laterality: Left;  . INGUINAL HERNIA REPAIR Left yrs ago  . LAPAROSCOPIC ASSISTED VENTRAL HERNIA REPAIR  08-08-2009   dr Excell Seltzer   AND OPEN REPAIR RECURRENT LEFT INGUINAL HERNIA  . LAPAROSCOPIC INGUINAL HERNIA REPAIR Left 07-24-1999    dr Excell Seltzer  . OPEN VENTRAL HERNIA REPAIR /  LYSIS ADHESIONS  09-23-2010    dr Donne Hazel  . ORIF ELBOW FRACTURE Left 03/29/2015   Procedure: LEFT ELBOW OPEN REDUCTION INTERNAL FIXATION (ORIF) DISTAL HUMERUS FRACTURE WITH OLECRANON OSTEOTOMY AND ULNAR NERVE RELEASE;  Surgeon: Roseanne Kaufman, MD;  Location: [facility];  Service: Orthopedics;  Laterality: Left;  . PPM GENERATOR CHANGEOUT N/A 02/10/2018   Procedure: PPM GENERATOR CHANGEOUT;  Surgeon: Deboraha Sprang, MD;  Location: [facility];  Service: Cardiovascular;  Laterality: N/A;  . REPAIR SUPRAUMBILICAL HERNIA   [phone]   dr Excell Seltzer  . SHOULDER ARTHROSCOPY WITH DISTAL CLAVICLE RESECTION Right 11-07-2007   dr Rhona Raider   [place] [facility]CROMI[facility]LAST[facility]  . SKIN SPLIT GRAFT Left 10/22/2012   Procedure: SKIN GRAFT SPLIT  THICKNESS;  Surgeon: Marianna Payment, MD;  Location: [facility];  Service: Orthopedics;  Laterality: Left;  . TOTAL KNEE ARTHROPLASTY Right 1990's  . TRANSTHORACIC ECHOCARDIOGRAM  06-12-2012   dr Irish Lack   ef 50-55%/ mild LAE/ mild MR and TR/  AV sclerosis without stenosis/  RVSP 13mmHg  . TRANSURETHRAL RESECTION OF BLADDER TUMOR N/A 07/15/2017   Procedure: CYSTOSCOPY TRANSURETHRAL  RESECTION OF BLADDER TUMOR (TURBT);  Surgeon: Kathie Rhodes, MD;  Location: [facility];  Service: Urology;  Laterality: N/A;   Social History   Occupational History  . 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 Smoking status: Former Smoker    Packs/day: 1.50    Years: 35.00    Pack years: 52.50    Types: Cigarettes    Last attempt to quit: 02/15/1973    Years since quitting: 45.1  . Smokeless tobacco: Never Used  Substance and Sexual Activity  . Alcohol use: No  . Drug use: No  . Sexual activity: Never

## 2018-04-17 ENCOUNTER — Ambulatory Visit (INDEPENDENT_AMBULATORY_CARE_PROVIDER_SITE_OTHER): Payer: Medicare Other | Admitting: Orthopedic Surgery

## 2018-04-17 DIAGNOSIS — M4840XS Fatigue fracture of vertebra, site unspecified, sequela of fracture: Secondary | ICD-10-CM | POA: Diagnosis not present

## 2018-04-17 DIAGNOSIS — M546 Pain in thoracic spine: Secondary | ICD-10-CM | POA: Diagnosis not present

## 2018-04-17 DIAGNOSIS — G894 Chronic pain syndrome: Secondary | ICD-10-CM | POA: Diagnosis not present

## 2018-04-17 DIAGNOSIS — Z79891 Long term (current) use of opiate analgesic: Secondary | ICD-10-CM | POA: Diagnosis not present

## 2018-04-18 DIAGNOSIS — H6123 Impacted cerumen, bilateral: Secondary | ICD-10-CM | POA: Diagnosis not present

## 2018-04-18 DIAGNOSIS — J310 Chronic rhinitis: Secondary | ICD-10-CM | POA: Diagnosis not present

## 2018-04-19 ENCOUNTER — Encounter: Payer: Self-pay | Admitting: Podiatry

## 2018-04-19 ENCOUNTER — Ambulatory Visit (INDEPENDENT_AMBULATORY_CARE_PROVIDER_SITE_OTHER): Payer: Medicare Other | Admitting: Podiatry

## 2018-04-19 DIAGNOSIS — M79674 Pain in right toe(s): Secondary | ICD-10-CM

## 2018-04-19 DIAGNOSIS — B351 Tinea unguium: Secondary | ICD-10-CM | POA: Diagnosis not present

## 2018-04-19 DIAGNOSIS — M779 Enthesopathy, unspecified: Secondary | ICD-10-CM | POA: Diagnosis not present

## 2018-04-19 DIAGNOSIS — M79675 Pain in left toe(s): Secondary | ICD-10-CM | POA: Diagnosis not present

## 2018-04-19 MED ORDER — TRIAMCINOLONE ACETONIDE 10 MG/ML IJ SUSP
10.0000 mg | Freq: Once | INTRAMUSCULAR | Status: AC
  Administered 2018-04-19: 10 mg

## 2018-04-19 NOTE — Progress Notes (Signed)
Subjective:   Patient ID: [name] [name], male   DOB: 85 y.o.   MRN: [id number]   HPI Patient presents with exquisite discomfort in the big toe joint right keratotic tissue around the first metatarsal and nail disease 1 through 5 both feet that he cannot cut and are painful   ROS      Objective:  Physical Exam  Neurovascular status unchanged with patient found to have inflammation in the right first MPJ that is painful when pressed and makes it difficult for him to wear shoe gear comfortably.  Also has nail disease 1-5 both feet with yellow subungual debris that are painful and he cannot cut     Assessment:  Chronic mycotic nail infection 1-5 both feet with inflammation capsulitis first MPJ right     Plan:  H&P conditions reviewed sterile prep applied and injected the first MPJ 3 mg Kenalog 5 mg Xylocaine and debrided nailbeds 1-5 both feet with no iatrogenic bleeding and reappoint for routine care or if any issues should come up

## 2018-04-26 ENCOUNTER — Ambulatory Visit (INDEPENDENT_AMBULATORY_CARE_PROVIDER_SITE_OTHER): Payer: Medicare Other | Admitting: Podiatry

## 2018-04-26 ENCOUNTER — Other Ambulatory Visit: Payer: Self-pay

## 2018-04-26 ENCOUNTER — Encounter: Payer: Self-pay | Admitting: Podiatry

## 2018-04-26 DIAGNOSIS — L03031 Cellulitis of right toe: Secondary | ICD-10-CM | POA: Diagnosis not present

## 2018-04-26 DIAGNOSIS — M779 Enthesopathy, unspecified: Secondary | ICD-10-CM

## 2018-04-26 DIAGNOSIS — L02611 Cutaneous abscess of right foot: Secondary | ICD-10-CM | POA: Diagnosis not present

## 2018-04-26 MED ORDER — CEPHALEXIN 500 MG PO CAPS: 500.0000 mg | ORAL_CAPSULE | Freq: Three times a day (TID) | ORAL | 1 refills | Status: DC

## 2018-04-27 NOTE — Progress Notes (Signed)
Subjective:   Patient ID: Volney Presser, male   DOB: 85 y.o.   MRN: [id number]   HPI Patient presents concerned because his right big toe seems to be getting worse where we gave the injection seems somewhat better but in a more distal fashion there seems to be some irritation of the tissue noted and there is redness in his toe   ROS      Objective:  Physical Exam  No change health history or neurovascular status with patient found to have a erythematous right hallux with keratotic lesion on the medial side of the toe that is irritated with no odor or active drainage noted.  It is not as painful in the MPJ and there is no proximal edema erythema or drainage noted     Assessment:  Appears to be more of a infection of his left big toe which may be due to the keratotic lesion with possible bacterial infiltration     Plan:  H&P condition reviewed and at this point sterile sharp debridement of lesion was accomplished soaks were to begin and I placed him on antibiotic cephalexin 5 mg 3 times daily.  Gave him strict instructions if any changes were to occur to let us know but at this point appears to be local and should heal uneventfully.  If pain persist he will be seen back

## 2018-05-03 ENCOUNTER — Ambulatory Visit: Payer: Medicare Other | Admitting: Podiatry

## 2018-05-05 ENCOUNTER — Other Ambulatory Visit: Payer: Self-pay

## 2018-05-05 ENCOUNTER — Encounter: Payer: Self-pay | Admitting: Podiatry

## 2018-05-05 ENCOUNTER — Ambulatory Visit (INDEPENDENT_AMBULATORY_CARE_PROVIDER_SITE_OTHER): Payer: Medicare Other | Admitting: Podiatry

## 2018-05-05 ENCOUNTER — Ambulatory Visit: Payer: Medicare Other | Admitting: Podiatry

## 2018-05-05 DIAGNOSIS — L97511 Non-pressure chronic ulcer of other part of right foot limited to breakdown of skin: Secondary | ICD-10-CM

## 2018-05-05 DIAGNOSIS — L02611 Cutaneous abscess of right foot: Secondary | ICD-10-CM

## 2018-05-05 DIAGNOSIS — L03031 Cellulitis of right toe: Secondary | ICD-10-CM

## 2018-05-05 MED ORDER — DOXYCYCLINE HYCLATE 100 MG PO TABS: 100.0000 mg | ORAL_TABLET | Freq: Two times a day (BID) | ORAL | 0 refills | Status: DC

## 2018-05-07 NOTE — Progress Notes (Signed)
Subjective:   Patient ID: [name] [name], male   DOB: 85 y.o.   MRN: [id number]   HPI Patient states his right big toe has really been bothering him and he has trouble with being able to wear any type of shoe gear currently   ROS      Objective:  Physical Exam  Neurovascular status has not changed with patient found to have quite a bit of swelling in his right lower leg which he gets periodically and is not been able to wear the compression stockings due to the pain in his big toe.  There is a breakdown of tissue in the superficial layer with no subcutaneous or bone exposure at the inner phalangeal joint medial side that measures approximately 8 mm x 6 mm.  There is no proximal edema erythema or drainage also noted     Assessment:  Poor health individual with mild ulceration of the right hallux medial side with no current drainage noted but quite a bit of pain     Plan:  H&P condition reviewed and at this point I did use sterile instrumentation carefully clean the tissue and did not note deeper channel or other pathology.  He does have a dilemma of his increased swelling in his leg and he is not staying off his foot the way we had asked and his caregiver agrees on that fact.  Today I debrided the tissue I applied meta honey applied sterile dressing on it and instructed him on elevation and continuing to change the dressing placed him on doxycycline to replace the previous antibiotic and gave him a surgical shoe.  Gave him strict instructions if he should elevate any increased redness and increased swelling or drainage or systemic signs of infection he is to go straight to the emergency room and there is a possibility that he is going to require amputation at one point

## 2018-05-09 ENCOUNTER — Telehealth: Payer: Self-pay | Admitting: Oncology

## 2018-05-09 NOTE — Telephone Encounter (Signed)
R/s appt per 3/24 sch message due to covid19 - pt aware of new appt date and time

## 2018-05-10 ENCOUNTER — Ambulatory Visit: Payer: Medicare Other | Admitting: Podiatry

## 2018-05-10 ENCOUNTER — Telehealth (INDEPENDENT_AMBULATORY_CARE_PROVIDER_SITE_OTHER): Payer: Self-pay | Admitting: Orthopedic Surgery

## 2018-05-10 NOTE — Telephone Encounter (Signed)
I called pt and he states that he has a nondisplaced sacrum fx and that the prednisone that he was taking is not helping. He states that he is also taking Oxycodone 5 mg that he gets from pain management and this is not helping either. He wants to know if he can use some type of corset and if this could stop the pain.

## 2018-05-10 NOTE — Telephone Encounter (Signed)
Pt called saying the medication isn't helping with the pain and if he can get something else

## 2018-05-10 NOTE — Telephone Encounter (Signed)
Good idea to try, go to one of the medical supply stores and see if they have a binder to wear

## 2018-05-11 ENCOUNTER — Ambulatory Visit: Payer: Medicare Other | Admitting: Podiatry

## 2018-05-12 ENCOUNTER — Telehealth (INDEPENDENT_AMBULATORY_CARE_PROVIDER_SITE_OTHER): Payer: Self-pay | Admitting: Orthopedic Surgery

## 2018-05-12 ENCOUNTER — Other Ambulatory Visit (INDEPENDENT_AMBULATORY_CARE_PROVIDER_SITE_OTHER): Payer: Self-pay

## 2018-05-12 MED ORDER — PREDNISONE 10 MG PO TABS: 10.0000 mg | ORAL_TABLET | Freq: Every morning | ORAL | 0 refills | Status: DC

## 2018-05-12 NOTE — Telephone Encounter (Signed)
Patient left a message stating that he needed to talk to you again.  CB#[phone].  Thank you.

## 2018-05-12 NOTE — Telephone Encounter (Signed)
Can you please call pt to advise. thanks

## 2018-05-12 NOTE — Telephone Encounter (Signed)
Pt was called and discussed that I will hold message for Dr Sharol Given until Monday.

## 2018-05-12 NOTE — Telephone Encounter (Signed)
Pt was informed and Rx was sent to pharmacy for Prednisone.

## 2018-05-15 ENCOUNTER — Telehealth (INDEPENDENT_AMBULATORY_CARE_PROVIDER_SITE_OTHER): Payer: Self-pay

## 2018-05-15 NOTE — Telephone Encounter (Signed)
I tried to call pt and inform him that Dr Sharol Given stated that he will see him at his appt this Thurs and they will figure everything out for him then. If pt calls please inform him, thanks.

## 2018-05-15 NOTE — Telephone Encounter (Signed)
Per another message in chart, Lorenza Chick had called patient to let him know that Dr. Sharol Given will see him at his appointment on Thursday and discuss all problems then. I spoke with patient's wife and advised.

## 2018-05-16 ENCOUNTER — Encounter: Payer: Self-pay | Admitting: Internal Medicine

## 2018-05-16 ENCOUNTER — Telehealth: Payer: Self-pay | Admitting: Pulmonary Disease

## 2018-05-16 NOTE — Telephone Encounter (Signed)
Contacted patient by phone to relay findings of CPAP compliance as per B. Warner Mccreedy, FNP review.  Patient informed good compliance, no changes and we will contact him when his 4-6 month appt with [name] is needed.  Recall reminder placed.  Patient acknowledged understanding of information without further questions and was instructed to call us if needing anything before his next appointment.  Nothing further needed.

## 2018-05-16 NOTE — Telephone Encounter (Signed)
05/16/2018 1650  Please contact the patient and let them know that, I have reviewed the patient's CPAP compliance report.  CPAP compliance report showing excellent compliance.  CPAP compliance report-04/16/2018-05/15/2018- usage days 30 of the last 30 days, 24 of those days greater than 4 hours, average usage 5 hours and 29 minutes, CPAP set pressure of 10, AHI 1.2  Please let the patient know to continue CPAP use as planned.  No further changes at this time.  Please ensure he has a scheduled follow-up with [name] in 4 to 6 months  Wyn Quaker FNP

## 2018-05-17 ENCOUNTER — Telehealth (INDEPENDENT_AMBULATORY_CARE_PROVIDER_SITE_OTHER): Payer: Self-pay

## 2018-05-17 NOTE — Telephone Encounter (Signed)
I called pt and pre-screen for COVID-19, stated no to all questions

## 2018-05-18 ENCOUNTER — Ambulatory Visit: Payer: Self-pay | Admitting: Oncology

## 2018-05-18 ENCOUNTER — Other Ambulatory Visit: Payer: Self-pay

## 2018-05-18 ENCOUNTER — Encounter (INDEPENDENT_AMBULATORY_CARE_PROVIDER_SITE_OTHER): Payer: Self-pay | Admitting: Orthopedic Surgery

## 2018-05-18 ENCOUNTER — Ambulatory Visit (INDEPENDENT_AMBULATORY_CARE_PROVIDER_SITE_OTHER): Payer: Medicare Other | Admitting: Orthopedic Surgery

## 2018-05-18 VITALS — Ht 69.0 in | Wt 165.0 lb

## 2018-05-18 DIAGNOSIS — L97919 Non-pressure chronic ulcer of unspecified part of right lower leg with unspecified severity: Secondary | ICD-10-CM

## 2018-05-18 DIAGNOSIS — I87333 Chronic venous hypertension (idiopathic) with ulcer and inflammation of bilateral lower extremity: Secondary | ICD-10-CM

## 2018-05-18 DIAGNOSIS — L97929 Non-pressure chronic ulcer of unspecified part of left lower leg with unspecified severity: Secondary | ICD-10-CM

## 2018-05-18 DIAGNOSIS — S32110A Nondisplaced Zone I fracture of sacrum, initial encounter for closed fracture: Secondary | ICD-10-CM

## 2018-05-18 MED ORDER — PREDNISONE 10 MG PO TABS: 20.0000 mg | ORAL_TABLET | Freq: Every day | ORAL | 0 refills | Status: DC

## 2018-05-18 NOTE — Progress Notes (Signed)
Office Visit Note   Patient: [name] [name]           Date of Birth: [birth year]           MRN: [id number] Visit Date: 05/18/2018              Requested by: [name], MD [place], [place] [address] PCP: [name], MD  Chief Complaint  Patient presents with  . Lower Back - Follow-up      HPI: Patient is a 85 year old gentleman who presents complaining of sacral pain status post fall.  He also complains of some weakness with his hip flexors on the right.  Assessment & Plan: Visit Diagnoses:  1. Closed nondisplaced zone I fracture of sacrum, initial encounter ([place])   2. Idiopathic chronic venous hypertension of both lower extremities with ulcer and inflammation ([place])     Plan: Patient is called in a prescription for prednisone to start at 20 mg a day and decreased to 10 mg a day as his symptoms resolve and then take it every other day and discontinue.  He states he is taking vitamin D3 and will continue this.  Follow-Up Instructions: Return in about 3 weeks (around 06/08/2018).   Ortho Exam  Patient is alert, oriented, no adenopathy, well-dressed, normal affect, normal respiratory effort. Examination patient can get himself from a sitting to a standing position without assistance he uses his walker for ambulation.  He is tender across his sacrum.  Review of the CT scan is suggestive of some stress reaction across the sacrum but no displaced fracture no fracture lines.  Patient has a negative straight leg raise on the right he has venous stasis swelling of both lower extremities.  He has good plantar flexion dorsiflexion strength on the right good knee flexion and extension has weakness with hip flexion on the right.  Imaging: No results found. No images are attached to the encounter.  Labs: Lab Results  Component Value Date   CRP 16.2 (H) 10/20/2011   LABURIC 4.7 10/17/2006   REPTSTATUS 03/31/2015 FINAL 03/30/2015   GRAMSTAIN Abundant 12/18/2015    GRAMSTAIN WBC present-predominately PMN 12/18/2015   GRAMSTAIN No Squamous Epithelial Cells Seen 12/18/2015   GRAMSTAIN No Organisms Seen 12/18/2015   CULT 9,000 COLONIES/mL INSIGNIFICANT GROWTH 03/30/2015   LABORGA NORMAL SKIN FLORA 12/18/2015     Lab Results  Component Value Date   ALBUMIN 4.1 05/18/2016   ALBUMIN 3.2 (L) 10/17/2012   ALBUMIN 1.9 (L) 10/24/2011   LABURIC 4.7 10/17/2006    Body mass index is 24.37 kg/m.  Orders:  No orders of the defined types were placed in this encounter.  Meds ordered this encounter  Medications  . predniSONE (DELTASONE) 10 MG tablet    Sig: Take 2 tablets (20 mg total) by mouth daily with breakfast.    Dispense:  60 tablet    Refill:  0     Procedures: No procedures performed  Clinical Data: No additional findings.  ROS:  All other systems negative, except as noted in the HPI. Review of Systems  Objective: Vital Signs: Ht 5\' 9"  (1.753 m)   Wt 165 lb (74.8 kg)   BMI 24.37 kg/m   Specialty Comments:  No specialty comments available.  PMFS History: Patient Active Problem List   Diagnosis Date Noted  . Sacral fracture, closed ([place]) 04/13/2018  . PCO (posterior capsular opacification), bilateral 05/26/2017  . Weakness of right leg 04/20/2017  . 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 Exudative age-related macular degeneration of right eye with active choroidal neovascularization ([place]) 09/30/2016  . Pseudophakia of both eyes 09/30/2016  . Impingement syndrome of right shoulder 06/16/2016  . Impingement syndrome of left shoulder 06/16/2016  . Bilateral leg edema 06/14/2016  . Dizziness and giddiness 06/26/2015  . Abnormality of gait 06/26/2015  . Anxiety 04/14/2015  . GERD (gastroesophageal reflux disease) 04/03/2015  . OSA (obstructive sleep apnea) 04/03/2015  . HLD (hyperlipidemia) 04/03/2015  . SOB (shortness of breath)   . Left elbow fracture 03/29/2015  . Dizziness  01/01/2015  . LBP (low back pain) 01/01/2015  . Malignant neoplasm of prostate ([place]) 01/01/2015  . BP (high blood pressure) 01/01/2015  . DOE (dyspnea on exertion) 10/26/2013  . Essential hypertension, benign 10/26/2013  . Post-traumatic wound infection 10/17/2012  . Cellulitis 10/17/2012  . Atrial fibrillation ([place]) 10/22/2011  . DVT, lower extremity, distal, chronic ([place]) 10/22/2011  . Pacemaker 10/19/2011  . Prostate cancer ([place]) 10/19/2011  . COPD (chronic obstructive pulmonary disease) with emphysema ([place]) 02/04/2011  . Anemia 12/10/2010   Past Medical History:  Diagnosis Date  . Abnormality of gait    due to low back pain , uses  cane and walker  . Arthritis   . Bilateral lower extremity edema    wears TED hose  . Cancer [facility])    prostate- treated with radiation  . Cardiac pacemaker in situ 10/11/2006   medtronic  . Chronic iron deficiency anemia oncologist/ hematoloist-- dr Alen Blew   treatment IV Iron infusions  . Chronic low back pain   . CKD (chronic kidney disease), stage III ([place])   . COPD with emphysema [facility])    pulmologist-  dr Elsworth Soho  . DDD (degenerative disc disease), lumbosacral   . Degenerative scoliosis   . Dyspnea    with exertion  . Full dentures   . GERD (gastroesophageal reflux disease)   . Gross hematuria   . Headache    history of migraines- "way in the past"  . Hereditary and idiopathic peripheral neuropathy    bilateral lower leg  . Hiatal hernia   . History of DVT of lower extremity 10/19/2011   chronic distal popliteal right lower extremity  . History of external beam radiation therapy 2008   prostate cancer  . History of pancreatitis 2013  . History of pneumothorax 09/2006   iatrogenic-- in setting cardiac pacemaker placement  . History of prostate cancer 2008   s/p  external radiation therapy  . Hypertension   . Lesion of bladder   . Macular degeneration of right eye   . Nocturia   . OSA on CPAP   . PAF (paroxysmal atrial  fibrillation) ([place]) 10/2011   1st episode documented 09/ 2013 admission for gallstons/ pancreatitis and prior to cholecystectomy , went back in NSR without interventeion  . Presence of permanent cardiac pacemaker   . Sinus node dysfunction ([facility])    s/p  cardiac pacemaker placement 10-11-2006  . Wears glasses   . Wears hearing aid in both ears     Family History  Problem Relation Age of Onset  . Heart disease Father   . Heart disease Mother   . Colon cancer Mother   . Stroke Mother   . Cancer Sister   . Heart disease Sister   . Heart attack Neg Hx   . Hypertension Neg Hx     Past Surgical History:  Procedure Laterality Date  .  BALLOON DILATION N/A 01/14/2015   Procedure: BALLOON DILATION;  Surgeon: Garlan Fair, MD;  Location: [facility];  Service: Endoscopy;  Laterality: N/A;  . BALLOON DILATION N/A 10/12/2017   Procedure: BALLOON DILATION;  Surgeon: Otis Brace, MD;  Location: [facility];  Service: Gastroenterology;  Laterality: N/A;  . BIOPSY  10/12/2017   Procedure: BIOPSY;  Surgeon: Otis Brace, MD;  Location: [facility];  Service: Gastroenterology;;  . CARDIAC CATHETERIZATION  08-29-2000   [place]   no significant obstructive CAD, intact globle LV size and systolic function with regional wall motion abnormalities noted,  ?coronary spasm producing wall motion abnormality, elevated CPK and chest pain (ef 55%)  . CARDIAC PACEMAKER PLACEMENT  10-11-2006    dr Leonia Reeves   W/  ATRIAL LEAD REVISION 10-12-2006   (medtronic)  . CARDIOVASCULAR STRESS TEST  11-18-2011    dr Irish Lack   Low risk nuclear study w/ no ishemia/  normal wall motion,  post-stress ef 48% (LVEF 56% on prior study 2008)  . CARPAL TUNNEL RELEASE Left 11/14/2014   Procedure: CARPAL TUNNEL RELEASE LEFT THUMB;  Surgeon: Daryll Brod, MD;  Location: [place];  Service: Orthopedics;  Laterality: Left;  ANESTHESIA:  IV REGIONAL FAB  . CATARACT EXTRACTION W/ INTRAOCULAR LENS  IMPLANT, BILATERAL     . CHOLECYSTECTOMY  10/23/2011  . CHOLECYSTECTOMY  10/23/2011   Procedure: CHOLECYSTECTOMY;  Surgeon: Rolm Bookbinder, MD;  Location: [facility];  Service: General;  Laterality: N/A;  with intraoperative cholangiogram  . COLONOSCOPY    . ESOPHAGOGASTRODUODENOSCOPY (EGD) WITH PROPOFOL N/A 01/14/2015   Procedure: ESOPHAGOGASTRODUODENOSCOPY (EGD) WITH PROPOFOL;  Surgeon: Garlan Fair, MD;  Location: [facility];  Service: Endoscopy;  Laterality: N/A;  . ESOPHAGOGASTRODUODENOSCOPY (EGD) WITH PROPOFOL N/A 10/12/2017   Procedure: ESOPHAGOGASTRODUODENOSCOPY (EGD) WITH PROPOFOL;  Surgeon: Otis Brace, MD;  Location: [facility];  Service: Gastroenterology;  Laterality: N/A;  . I&D EXTREMITY Left 10/20/2012   Procedure: LEFT LEG IRRIGATION AND DEBRIDEMENT, AND WOUND VAC APPLICATION;  Surgeon: Marianna Payment, MD;  Location: [facility];  Service: Orthopedics;  Laterality: Left;  . I&D EXTREMITY Left 10/22/2012   Procedure: IRRIGATION AND DEBRIDEMENT EXTREMITY;  Surgeon: Marianna Payment, MD;  Location: [facility];  Service: Orthopedics;  Laterality: Left;  . INGUINAL HERNIA REPAIR Left yrs ago  . LAPAROSCOPIC ASSISTED VENTRAL HERNIA REPAIR  08-08-2009   dr Excell Seltzer   AND OPEN REPAIR RECURRENT LEFT INGUINAL HERNIA  . LAPAROSCOPIC INGUINAL HERNIA REPAIR Left 07-24-1999    dr Excell Seltzer  . OPEN VENTRAL HERNIA REPAIR /  LYSIS ADHESIONS  09-23-2010    dr Donne Hazel  . ORIF ELBOW FRACTURE Left 03/29/2015   Procedure: LEFT ELBOW OPEN REDUCTION INTERNAL FIXATION (ORIF) DISTAL HUMERUS FRACTURE WITH OLECRANON OSTEOTOMY AND ULNAR NERVE RELEASE;  Surgeon: Roseanne Kaufman, MD;  Location: [facility];  Service: Orthopedics;  Laterality: Left;  . PPM GENERATOR CHANGEOUT N/A 02/10/2018   Procedure: PPM GENERATOR CHANGEOUT;  Surgeon: Deboraha Sprang, MD;  Location: [facility];  Service: Cardiovascular;  Laterality: N/A;  . REPAIR SUPRAUMBILICAL HERNIA   [phone]   dr Excell Seltzer  . SHOULDER ARTHROSCOPY WITH DISTAL CLAVICLE  RESECTION Right 11-07-2007   dr Rhona Raider   [facility] [facility]  . SKIN SPLIT GRAFT Left 10/22/2012   Procedure: SKIN GRAFT SPLIT THICKNESS;  Surgeon: Marianna Payment, MD;  Location: [facility];  Service: Orthopedics;  Laterality: Left;  . TOTAL KNEE ARTHROPLASTY Right 1990's  . TRANSTHORACIC ECHOCARDIOGRAM  06-12-2012  dr Irish Lack   ef 50-55%/ mild LAE/ mild MR and TR/  AV sclerosis without stenosis/  RVSP 90mmHg  . TRANSURETHRAL RESECTION OF BLADDER TUMOR N/A 07/15/2017   Procedure: CYSTOSCOPY TRANSURETHRAL RESECTION OF BLADDER TUMOR (TURBT);  Surgeon: Kathie Rhodes, MD;  Location: [facility];  Service: Urology;  Laterality: N/A;   Social History   Occupational History  . 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 Smoking status: Former Smoker    Packs/day: 1.50    Years: 35.00    Pack years: 52.50    Types: Cigarettes    Last attempt to quit: 02/15/1973    Years since quitting: 45.2  . Smokeless tobacco: Never Used  Substance and Sexual Activity  . Alcohol use: No  . Drug use: No  . Sexual activity: Never

## 2018-05-22 ENCOUNTER — Ambulatory Visit: Payer: Self-pay | Admitting: Pulmonary Disease

## 2018-05-22 ENCOUNTER — Telehealth: Payer: Self-pay | Admitting: Podiatry

## 2018-05-22 MED ORDER — DOXYCYCLINE HYCLATE 100 MG PO TABS: 100.0000 mg | ORAL_TABLET | Freq: Two times a day (BID) | ORAL | 0 refills | Status: DC

## 2018-05-22 MED ORDER — MEDIHONEY WOUND/BURN DRESSING EX GEL: CUTANEOUS | 5 refills | Status: DC

## 2018-05-22 NOTE — Telephone Encounter (Signed)
I called and pt states the toe is still some red, no more drainage, and the medihoney helped, and he has an appt for next Wednesday. Dr. Josephina Shih the medihoney, and refill the doxycycline.

## 2018-05-22 NOTE — Telephone Encounter (Signed)
Pt was seen on 05/05/18 and was given a medicated cream in the office to help with the joint pain in his feet. Pt was unsure of the name but stated that the cream had the name honey in it. Pt would like a refill of this cream if he can. Please give patient a call.

## 2018-05-22 NOTE — Addendum Note (Signed)
Addended by: Harriett Sine D on: 05/22/2018 04:32 PM   Modules accepted: Orders

## 2018-05-24 ENCOUNTER — Ambulatory Visit: Payer: Medicare Other | Admitting: Podiatry

## 2018-05-25 ENCOUNTER — Other Ambulatory Visit: Payer: Self-pay

## 2018-05-25 ENCOUNTER — Ambulatory Visit (INDEPENDENT_AMBULATORY_CARE_PROVIDER_SITE_OTHER): Payer: Medicare Other | Admitting: *Deleted

## 2018-05-25 DIAGNOSIS — I495 Sick sinus syndrome: Secondary | ICD-10-CM | POA: Diagnosis not present

## 2018-05-25 LAB — CUP PACEART REMOTE DEVICE CHECK
Battery Remaining Longevity: 156 mo
Battery Voltage: 3.2 V
Brady Statistic AP VP Percent: 0.57 %
Brady Statistic AP VS Percent: 69.54 %
Brady Statistic AS VP Percent: 0.03 %
Brady Statistic AS VS Percent: 29.86 %
Brady Statistic RA Percent Paced: 69.13 %
Brady Statistic RV Percent Paced: 0.6 %
Date Time Interrogation Session: 20200409024812
Implantable Lead Implant Date: 20080826
Implantable Lead Implant Date: 20080826
Implantable Lead Location: 753859
Implantable Lead Location: 753860
Implantable Lead Model: 5076
Implantable Lead Model: 5076
Implantable Pulse Generator Implant Date: 20191227
Lead Channel Impedance Value: 285 Ohm
Lead Channel Impedance Value: 323 Ohm
Lead Channel Impedance Value: 380 Ohm
Lead Channel Impedance Value: 418 Ohm
Lead Channel Pacing Threshold Amplitude: 1 V
Lead Channel Pacing Threshold Amplitude: 1.75 V
Lead Channel Pacing Threshold Pulse Width: 0.4 ms
Lead Channel Pacing Threshold Pulse Width: 0.4 ms
Lead Channel Sensing Intrinsic Amplitude: 2.125 mV
Lead Channel Sensing Intrinsic Amplitude: 2.625 mV
Lead Channel Setting Pacing Amplitude: 2 V
Lead Channel Setting Pacing Amplitude: 3.5 V
Lead Channel Setting Pacing Pulse Width: 0.4 ms
Lead Channel Setting Sensing Sensitivity: 1.2 mV

## 2018-05-30 ENCOUNTER — Telehealth: Payer: Self-pay | Admitting: Internal Medicine

## 2018-05-30 NOTE — Telephone Encounter (Signed)
Spoke with patient. Advised that as we received a transmission automatically on 4/8, he doesn't need to do anything further at this time. He verbalizes understanding and thanked me for my call.

## 2018-05-30 NOTE — Telephone Encounter (Signed)
Pt down for a virtual visit with Caryl Comes on Monday. He needs to send a transmission prior. He states he talked to someone last week and he was to get a call back to get a walk through for the transmission. Pls call

## 2018-05-31 ENCOUNTER — Ambulatory Visit (INDEPENDENT_AMBULATORY_CARE_PROVIDER_SITE_OTHER): Payer: Medicare Other | Admitting: Podiatry

## 2018-05-31 ENCOUNTER — Other Ambulatory Visit: Payer: Self-pay

## 2018-05-31 ENCOUNTER — Encounter: Payer: Self-pay | Admitting: Podiatry

## 2018-05-31 VITALS — Temp 96.6°F

## 2018-05-31 DIAGNOSIS — L97511 Non-pressure chronic ulcer of other part of right foot limited to breakdown of skin: Secondary | ICD-10-CM

## 2018-06-01 ENCOUNTER — Encounter: Payer: Self-pay | Admitting: Internal Medicine

## 2018-06-01 DIAGNOSIS — R159 Full incontinence of feces: Secondary | ICD-10-CM | POA: Diagnosis not present

## 2018-06-01 DIAGNOSIS — R634 Abnormal weight loss: Secondary | ICD-10-CM | POA: Diagnosis not present

## 2018-06-01 DIAGNOSIS — R9389 Abnormal findings on diagnostic imaging of other specified body structures: Secondary | ICD-10-CM | POA: Diagnosis not present

## 2018-06-01 NOTE — Progress Notes (Signed)
Subjective:   Patient ID: [name] [name], male   DOB: 85 y.o.   MRN: [id number]   HPI Patient states he is still having some pain but he seems to be improved on his right big toe   ROS      Objective:  Physical Exam  Neurovascular status unchanged with patient shown to have some abraded skin on the right hallux medial side with superficial breakdown with no subcutaneous exposure currently and smaller in size measuring 6 x 6 mm with no proximal edema erythema drainage noted     Assessment:  Appears to be healing from ulceration of the right hallux     Plan:  Reviewed continued padding of the area continued surgical shoe usage continued soaks and meta honey usage and patient will be seen back for regular visit or earlier if any issues should occur signed visit

## 2018-06-02 ENCOUNTER — Encounter: Payer: Medicare Other | Admitting: Internal Medicine

## 2018-06-05 ENCOUNTER — Encounter: Payer: Self-pay | Admitting: Cardiology

## 2018-06-05 ENCOUNTER — Telehealth (INDEPENDENT_AMBULATORY_CARE_PROVIDER_SITE_OTHER): Payer: Medicare Other | Admitting: Internal Medicine

## 2018-06-05 ENCOUNTER — Other Ambulatory Visit: Payer: Self-pay

## 2018-06-05 VITALS — Ht 69.0 in | Wt 168.0 lb

## 2018-06-05 DIAGNOSIS — Z950 Presence of cardiac pacemaker: Secondary | ICD-10-CM

## 2018-06-05 DIAGNOSIS — I495 Sick sinus syndrome: Secondary | ICD-10-CM | POA: Diagnosis not present

## 2018-06-05 DIAGNOSIS — I480 Paroxysmal atrial fibrillation: Secondary | ICD-10-CM

## 2018-06-05 DIAGNOSIS — I10 Essential (primary) hypertension: Secondary | ICD-10-CM

## 2018-06-05 NOTE — Progress Notes (Signed)
Electrophysiology TeleHealth Note   Due to national recommendations of social distancing due to [place] 19, an audio/video telehealth visit is felt to be most appropriate for this patient at this time  The patient did not have access to video technology/had technical difficulties with video requiring transitioning to audio format only (telephone).  All issues noted in this document were discussed and addressed.  No physical exam could be performed with this format.       See MyChart message from today for the patient's consent to telehealth for [facility].   Date:  06/05/2018   ID:  [name] [name], DOB 21-Sep-1924, MRN [id number]  Location: patient's home  Provider location: [address], [place]  Evaluation Performed: Follow-up visit  PCP:  [name], MD  Cardiologist:  JV Electrophysiologist:  SK   Chief Complaint:  pacemaker  History of Present Illness:    [name] [name] is a 85 y.o. male who presents via audio/video conferencing for a telehealth visit today.  Since last being seen in our clinic, the patient reports has been diagnosed with pelvic stress fractures   Still walking despite the pain;  No CP  SOB, unless walking up a hill;  Uses walker  Chronic edema.  The patient denies symptoms of fevers, chills, cough, or new SOB worrisome for COVID 19.   Past Medical History:  Diagnosis Date  . Abnormality of gait    due to low back pain , uses  cane and walker  . Arthritis   . Bilateral lower extremity edema    wears TED hose  . Cancer [facility])    prostate- treated with radiation  . Cardiac pacemaker in situ 10/11/2006   medtronic  . Chronic iron deficiency anemia oncologist/ hematoloist-- dr Alen Blew   treatment IV Iron infusions  . Chronic low back pain   . CKD (chronic kidney disease), stage III ([place])   . COPD with emphysema [facility])    pulmologist-  dr Elsworth Soho  . DDD (degenerative disc disease), lumbosacral   . Degenerative scoliosis   .  Dyspnea    with exertion  . Full dentures   . GERD (gastroesophageal reflux disease)   . Gross hematuria   . Headache    history of migraines- "way in the past"  . Hereditary and idiopathic peripheral neuropathy    bilateral lower leg  . Hiatal hernia   . History of DVT of lower extremity 10/19/2011   chronic distal popliteal right lower extremity  . History of external beam radiation therapy 2008   prostate cancer  . History of pancreatitis 2013  . History of pneumothorax 09/2006   iatrogenic-- in setting cardiac pacemaker placement  . History of prostate cancer 2008   s/p  external radiation therapy  . Hypertension   . Lesion of bladder   . Macular degeneration of right eye   . Nocturia   . OSA on CPAP   . PAF (paroxysmal atrial fibrillation) ([place]) 10/2011   1st episode documented 09/ 2013 admission for gallstons/ pancreatitis and prior to cholecystectomy , went back in NSR without interventeion  . Presence of permanent cardiac pacemaker   . Sinus node dysfunction (HCC)    s/p  cardiac pacemaker placement 10-11-2006  . Wears glasses   . Wears hearing aid in both ears     Past Surgical History:  Procedure Laterality Date  . BALLOON DILATION N/A 01/14/2015   Procedure: BALLOON DILATION;  Surgeon: Garlan Fair, MD;  [facility];  Service: Endoscopy;  Laterality: N/A;  . BALLOON DILATION N/A 10/12/2017   Procedure: BALLOON DILATION;  Surgeon: Otis Brace, MD;  Location: [place] [facility];  Service: Gastroenterology;  Laterality: N/A;  . BIOPSY  10/12/2017   Procedure: BIOPSY;  Surgeon: Otis Brace, MD;  Location: [place] [facility]COPY;  Service: Gastroenterology;;  . CARDIAC CATHETERIZATION  08-29-2000   [place]   no significant obstructive CAD, intact globle LV size and systolic function with regional wall motion abnormalities noted,  ?coronary spasm producing wall motion abnormality, elevated CPK and chest pain (ef 55%)  . CARDIAC PACEMAKER PLACEMENT  10-11-2006     dr Leonia Reeves   W/  ATRIAL LEAD REVISION 10-12-2006   (medtronic)  . CARDIOVASCULAR STRESS TEST  11-18-2011    dr Irish Lack   Low risk nuclear study w/ no ishemia/  normal wall motion,  post-stress ef 48% (LVEF 56% on prior study 2008)  . CARPAL TUNNEL RELEASE Left 11/14/2014   Procedure: CARPAL TUNNEL RELEASE LEFT THUMB;  Surgeon: Daryll Brod, MD;  Location: [place];  Service: Orthopedics;  Laterality: Left;  ANESTHESIA:  IV REGIONAL FAB  . CATARACT EXTRACTION W/ INTRAOCULAR LENS  IMPLANT, BILATERAL    . CHOLECYSTECTOMY  10/23/2011  . CHOLECYSTECTOMY  10/23/2011   Procedure: CHOLECYSTECTOMY;  Surgeon: Rolm Bookbinder, MD;  Location: [place];  Service: General;  Laterality: N/A;  with intraoperative cholangiogram  . COLONOSCOPY    . ESOPHAGOGASTRODUODENOSCOPY (EGD) WITH PROPOFOL N/A 01/14/2015   Procedure: ESOPHAGOGASTRODUODENOSCOPY (EGD) WITH PROPOFOL;  Surgeon: Garlan Fair, MD;  [facility];  Service: Endoscopy;  Laterality: N/A;  . ESOPHAGOGASTRODUODENOSCOPY (EGD) WITH PROPOFOL N/A 10/12/2017   Procedure: ESOPHAGOGASTRODUODENOSCOPY (EGD) WITH PROPOFOL;  Surgeon: Otis Brace, MD;  Location: [place];  Service: Gastroenterology;  Laterality: N/A;  . I&D EXTREMITY Left 10/20/2012   Procedure: LEFT LEG IRRIGATION AND DEBRIDEMENT, AND WOUND VAC APPLICATION;  Surgeon: Marianna Payment, MD;  [facility];  Service: Orthopedics;  Laterality: Left;  . I&D EXTREMITY Left 10/22/2012   Procedure: IRRIGATION AND DEBRIDEMENT EXTREMITY;  Surgeon: Marianna Payment, MD;  [facility];  Service: Orthopedics;  Laterality: Left;  . INGUINAL HERNIA REPAIR Left yrs ago  . LAPAROSCOPIC ASSISTED VENTRAL HERNIA REPAIR  08-08-2009   dr Excell Seltzer   AND OPEN REPAIR RECURRENT LEFT INGUINAL HERNIA  . LAPAROSCOPIC INGUINAL HERNIA REPAIR Left 07-24-1999    dr Excell Seltzer  . OPEN VENTRAL HERNIA REPAIR /  LYSIS ADHESIONS  09-23-2010    dr Donne Hazel  . ORIF ELBOW FRACTURE Left 03/29/2015    Procedure: LEFT ELBOW OPEN REDUCTION INTERNAL FIXATION (ORIF) DISTAL HUMERUS FRACTURE WITH OLECRANON OSTEOTOMY AND ULNAR NERVE RELEASE;  Surgeon: Roseanne Kaufman, MD;  Location: [place];  Service: Orthopedics;  Laterality: Left;  . PPM GENERATOR CHANGEOUT N/A 02/10/2018   Procedure: PPM GENERATOR CHANGEOUT;  Surgeon: Deboraha Sprang, MD;  Location: [place] CV LAB;  Service: Cardiovascular;  Laterality: N/A;  . REPAIR SUPRAUMBILICAL HERNIA   [id number]   dr Excell Seltzer  . SHOULDER ARTHROSCOPY WITH DISTAL CLAVICLE RESECTION Right 11-07-2007   dr Rhona Raider   [place] ACROMIOPLASTY  . SKIN SPLIT GRAFT Left 10/22/2012   Procedure: SKIN GRAFT SPLIT THICKNESS;  Surgeon: Marianna Payment, MD;  [facility];  Service: Orthopedics;  Laterality: Left;  . TOTAL KNEE ARTHROPLASTY Right 1990's  . TRANSTHORACIC ECHOCARDIOGRAM  06-12-2012   dr Irish Lack   ef 50-55%/ mild LAE/ mild MR and TR/  AV sclerosis without  stenosis/  RVSP 10mmHg  . TRANSURETHRAL RESECTION OF BLADDER TUMOR N/A 07/15/2017   Procedure: CYSTOSCOPY TRANSURETHRAL RESECTION OF BLADDER TUMOR (TURBT);  Surgeon: Kathie Rhodes, MD;  Location: [facility];  Service: Urology;  Laterality: N/A;    Current Outpatient Medications  Medication Sig Dispense Refill  . acetaminophen (TYLENOL) 650 MG CR tablet Take 1,300 mg by mouth 2 (two) times daily.     Marland Kitchen albuterol (PROVENTIL HFA;VENTOLIN HFA) 108 (90 Base) MCG/ACT inhaler Inhale 2 puffs into the lungs every 6 (six) hours as needed for wheezing or shortness of breath. 1 Inhaler 6  . aspirin EC 81 MG tablet Take 81 mg by mouth daily.     . Calcium Carbonate-Vitamin D (CALCIUM-D PO) Take 1 tablet by mouth daily.     Marland Kitchen denosumab (PROLIA) 60 MG/ML SOSY injection Inject 60 mg into the skin every 6 (six) months.    . diphenhydrAMINE (BENADRYL) 25 MG tablet Take 25 mg by mouth daily as needed for allergies.     . fluticasone (FLONASE) 50 MCG/ACT nasal spray Place 1 spray into  both nostrils daily as needed for allergies or rhinitis.    . furosemide (LASIX) 20 MG tablet Take 20-40 mg by mouth See admin instructions. Alternate taking 20 mg and 40 mg daily    . lisinopril (PRINIVIL,ZESTRIL) 10 MG tablet Take 10 mg by mouth daily.     . mirabegron ER (MYRBETRIQ) 50 MG TB24 tablet Take 50 mg by mouth daily.    . Multiple Vitamins-Minerals (PRESERVISION AREDS PO) Take 1 tablet by mouth 2 (two) times daily.    Marland Kitchen oxyCODONE (OXY IR/ROXICODONE) 5 MG immediate release tablet Take 5 mg by mouth every 6 (six) hours as needed for severe pain.    Marland Kitchen oxymetazoline (AFRIN) 0.05 % nasal spray Place 1 spray into both nostrils 2 (two) times daily as needed for congestion.    . pantoprazole (PROTONIX) 40 MG tablet Take 1 tablet (40 mg total) by mouth daily before breakfast. 90 tablet 1  . predniSONE (DELTASONE) 10 MG tablet Take 1 tablet (10 mg total) by mouth every morning. 90 tablet 0  . predniSONE (DELTASONE) 10 MG tablet Take 2 tablets (20 mg total) by mouth daily with breakfast. 60 tablet 0  . Umeclidinium-Vilanterol (ANORO ELLIPTA) 62.5-25 MCG/INH AEPB Inhale 1 puff into the lungs daily.     . vitamin B-12 (CYANOCOBALAMIN) 500 MCG tablet Take 500 mcg by mouth every Monday, Wednesday, and Friday.     . Wound Dressings (MEDIHONEY WOUND/BURN DRESSING) GEL Apply to affected are 3 times a week, and cover with sterile dressing. 1 Tube 5   No current facility-administered medications for this visit.     Allergies:   Patient has no known allergies.   Social History:  The patient  reports that he quit smoking about 45 years ago. His smoking use included cigarettes. He has a 52.50 pack-year smoking history. He has never used smokeless tobacco. He reports that he does not drink alcohol or use drugs.   Family History:  The patient's   family history includes Cancer in his sister; Colon cancer in his mother; Heart disease in his father, mother, and sister; Stroke in his mother.   ROS:  Please  see the history of present illness.   All other systems are personally reviewed and negative.    Exam:    Vital Signs:  Ht 5\' 9"  (1.753 m)   Wt 168 lb (76.2 kg)   BMI 24.81 kg/m  Well appearing, alert and conversant, regular work of breathing,  good skin color Eyes- anicteric, neuro- grossly intact, skin- no apparent rash or lesions or cyanosis, mouth- oral mucosa is pink   Labs/Other Tests and Data Reviewed:    Recent Labs: 02/10/2018: BUN 25; Creatinine, Ser 1.10; Hemoglobin 10.9; Potassium 4.1; Sodium 137   Wt Readings from Last 3 Encounters:  06/05/18 168 lb (76.2 kg)  05/18/18 165 lb (74.8 kg)  04/13/18 165 lb (74.8 kg)     Other studies personally reviewed: Additional studies/ records that were reviewed today include: As above   The patient presents wearable device technology report for my review today.    Last device remote is reviewed from [place] PDF dated 4/20* which reveals normal device function,   arrhythmias -atrial tachycardia    ASSESSMENT & PLAN:    Sinus node dysfunction  Pacemaker  Medtronic    Hypertension    PVCs  Atrial tachycardia NonSustained    Would recommend STOPPING  ASA as I dont see an indication --he will discuss w PCP  Normal device function  Chronic edema-- with toe problem recommended using a calf sleeve   COVID 19 screen The patient denies symptoms of COVID 19 at this time.  The importance of social distancing was discussed today.  Follow-up:  *63 m Next remote: As Scheduled   Current medicines are reviewed at length with the patient today.   The patient does not have concerns regarding his medicines.  The following changes were made today:  none  Labs/ tests ordered today include:   No orders of the defined types were placed in this encounter.    Patient Risk:  after full review of this patients clinical status, I feel that they are at moderate risk at this time.  Today, I have spent 12 minutes with the  patient with telehealth technology discussing the above.  Signed, Virl Axe, MD  06/05/2018 4:21 PM     [place] [employer] [address] [phone] (office) [phone] (fax)

## 2018-06-05 NOTE — Progress Notes (Signed)
Remote pacemaker transmission.   

## 2018-06-12 ENCOUNTER — Ambulatory Visit (INDEPENDENT_AMBULATORY_CARE_PROVIDER_SITE_OTHER): Payer: Medicare Other | Admitting: Orthopedic Surgery

## 2018-06-12 ENCOUNTER — Other Ambulatory Visit (INDEPENDENT_AMBULATORY_CARE_PROVIDER_SITE_OTHER): Payer: Self-pay | Admitting: Orthopedic Surgery

## 2018-06-12 NOTE — Telephone Encounter (Signed)
Ok to rf? 

## 2018-06-13 ENCOUNTER — Ambulatory Visit (INDEPENDENT_AMBULATORY_CARE_PROVIDER_SITE_OTHER): Payer: Medicare Other | Admitting: Orthopedic Surgery

## 2018-06-13 NOTE — Telephone Encounter (Signed)
Ok to refill 

## 2018-06-19 ENCOUNTER — Ambulatory Visit: Payer: Medicare Other | Admitting: Podiatry

## 2018-06-19 DIAGNOSIS — G629 Polyneuropathy, unspecified: Secondary | ICD-10-CM | POA: Diagnosis not present

## 2018-06-19 DIAGNOSIS — M150 Primary generalized (osteo)arthritis: Secondary | ICD-10-CM | POA: Diagnosis not present

## 2018-06-19 DIAGNOSIS — K432 Incisional hernia without obstruction or gangrene: Secondary | ICD-10-CM | POA: Diagnosis not present

## 2018-06-19 DIAGNOSIS — R2689 Other abnormalities of gait and mobility: Secondary | ICD-10-CM | POA: Diagnosis not present

## 2018-06-19 DIAGNOSIS — M7989 Other specified soft tissue disorders: Secondary | ICD-10-CM | POA: Diagnosis not present

## 2018-06-19 DIAGNOSIS — R739 Hyperglycemia, unspecified: Secondary | ICD-10-CM | POA: Diagnosis not present

## 2018-06-19 DIAGNOSIS — I10 Essential (primary) hypertension: Secondary | ICD-10-CM | POA: Diagnosis not present

## 2018-06-19 DIAGNOSIS — M4848XD Fatigue fracture of vertebra, sacral and sacrococcygeal region, subsequent encounter for fracture with routine healing: Secondary | ICD-10-CM | POA: Diagnosis not present

## 2018-06-19 DIAGNOSIS — R634 Abnormal weight loss: Secondary | ICD-10-CM | POA: Diagnosis not present

## 2018-06-19 DIAGNOSIS — M810 Age-related osteoporosis without current pathological fracture: Secondary | ICD-10-CM | POA: Diagnosis not present

## 2018-06-19 DIAGNOSIS — I872 Venous insufficiency (chronic) (peripheral): Secondary | ICD-10-CM | POA: Diagnosis not present

## 2018-06-19 DIAGNOSIS — D649 Anemia, unspecified: Secondary | ICD-10-CM | POA: Diagnosis not present

## 2018-06-21 DIAGNOSIS — M810 Age-related osteoporosis without current pathological fracture: Secondary | ICD-10-CM | POA: Diagnosis not present

## 2018-06-21 DIAGNOSIS — I10 Essential (primary) hypertension: Secondary | ICD-10-CM | POA: Diagnosis not present

## 2018-06-21 DIAGNOSIS — R739 Hyperglycemia, unspecified: Secondary | ICD-10-CM | POA: Diagnosis not present

## 2018-06-21 DIAGNOSIS — G629 Polyneuropathy, unspecified: Secondary | ICD-10-CM | POA: Diagnosis not present

## 2018-06-21 DIAGNOSIS — I872 Venous insufficiency (chronic) (peripheral): Secondary | ICD-10-CM | POA: Diagnosis not present

## 2018-06-21 DIAGNOSIS — D649 Anemia, unspecified: Secondary | ICD-10-CM | POA: Diagnosis not present

## 2018-06-21 DIAGNOSIS — R2689 Other abnormalities of gait and mobility: Secondary | ICD-10-CM | POA: Diagnosis not present

## 2018-06-21 DIAGNOSIS — M150 Primary generalized (osteo)arthritis: Secondary | ICD-10-CM | POA: Diagnosis not present

## 2018-06-21 DIAGNOSIS — R600 Localized edema: Secondary | ICD-10-CM | POA: Diagnosis not present

## 2018-06-21 DIAGNOSIS — J90 Pleural effusion, not elsewhere classified: Secondary | ICD-10-CM | POA: Diagnosis not present

## 2018-06-21 DIAGNOSIS — K432 Incisional hernia without obstruction or gangrene: Secondary | ICD-10-CM | POA: Diagnosis not present

## 2018-06-21 DIAGNOSIS — Z5181 Encounter for therapeutic drug level monitoring: Secondary | ICD-10-CM | POA: Diagnosis not present

## 2018-06-26 ENCOUNTER — Other Ambulatory Visit: Payer: Self-pay | Admitting: Family Medicine

## 2018-06-26 ENCOUNTER — Other Ambulatory Visit: Payer: Self-pay

## 2018-06-26 ENCOUNTER — Ambulatory Visit
Admission: RE | Admit: 2018-06-26 | Discharge: 2018-06-26 | Disposition: A | Discharge status: Home / Self Care | Payer: Medicare Other | Source: Ambulatory Visit | Attending: Family Medicine | Admitting: Family Medicine

## 2018-06-26 DIAGNOSIS — R0989 Other specified symptoms and signs involving the circulatory and respiratory systems: Secondary | ICD-10-CM

## 2018-06-26 DIAGNOSIS — J90 Pleural effusion, not elsewhere classified: Secondary | ICD-10-CM | POA: Diagnosis not present

## 2018-06-27 ENCOUNTER — Encounter: Payer: Self-pay | Admitting: Orthopedic Surgery

## 2018-06-27 ENCOUNTER — Ambulatory Visit (INDEPENDENT_AMBULATORY_CARE_PROVIDER_SITE_OTHER): Payer: Medicare Other | Admitting: Physician Assistant

## 2018-06-27 ENCOUNTER — Ambulatory Visit (INDEPENDENT_AMBULATORY_CARE_PROVIDER_SITE_OTHER): Payer: Medicare Other

## 2018-06-27 VITALS — Ht 69.0 in | Wt 168.0 lb

## 2018-06-27 DIAGNOSIS — S32110D Nondisplaced Zone I fracture of sacrum, subsequent encounter for fracture with routine healing: Secondary | ICD-10-CM | POA: Diagnosis not present

## 2018-06-27 DIAGNOSIS — M7541 Impingement syndrome of right shoulder: Secondary | ICD-10-CM

## 2018-06-27 DIAGNOSIS — S32110A Nondisplaced Zone I fracture of sacrum, initial encounter for closed fracture: Secondary | ICD-10-CM

## 2018-06-27 NOTE — Progress Notes (Signed)
Office Visit Note   Patient: [name] [name]           Date of Birth: 1924/05/10           MRN: [id number] Visit Date: 06/27/2018              Requested by: [name], MD [place], [place] [address] PCP: [name], MD  Chief Complaint  Patient presents with  . Pelvis - Pain      HPI: The patient is a 85 year old gentleman who is seen for 2 issues today #1 recurrent right shoulder pain.  He reports difficulty reaching up in front of him, as well as putting on close with the right shoulder.  He has had difficulty sleeping on the right shoulder.  #2 he does have a suspected pelvic insufficiency fracture previously evaluated with CT scan back in February of this year.  He reports continued difficulty with discomfort in the pelvic area reports pain with lying flat on his back and trying to get comfortable to rest.  He has been taking vitamin D supplementation as directed.  He does ambulate with a walker.  He is weightbearing as tolerated.  Assessment & Plan: Visit Diagnoses:  1. Closed nondisplaced zone I fracture of sacrum with routine healing, subsequent encounter   2. Impingement syndrome of right shoulder     Plan: After informed consent the patient underwent a steroid injection to the right shoulder under sterile techniques and tolerated this well.  We discussed that with regards to his pelvic insufficiency fracture, he should continue to take the vitamin D supplementation as directed.  I offered him some oxycodone to utilize as needed for more severe discomfort and he reports that he already has this and occasionally will take this as needed.  We could evaluate further with MRI scanning but at this point he would like to continue trying conservative treatments and symptomatic care.  He should continue to ambulate with his walker weightbearing as tolerated.  He is going to follow-up here in about 4 weeks.  Follow-Up Instructions: Return in about 4 weeks  (around 07/25/2018).   Ortho Exam  Patient is alert, oriented, no adenopathy, well-dressed, normal affect, normal respiratory effort. After informed consent the right shoulder was injected with a combination of lidocaine and Depo-Medrol under sterile techniques and the patient tolerated this well.  He has tenderness with internal rotation and external rotation and limitations of forward flexion as well.  He does report tenderness palpation over the posterior deltoid.  He has mild tenderness to palpation over the [facility] joint as well. He ambulates with a walker.  He remains tender to palpation over the sacral area.  EHLs are intact bilaterally.  He reports no new bowel or bladder issues.  Imaging: No results found. No images are attached to the encounter.  Labs: Lab Results  Component Value Date   CRP 16.2 (H) 10/20/2011   LABURIC 4.7 10/17/2006   REPTSTATUS 03/31/2015 FINAL 03/30/2015   GRAMSTAIN Abundant 12/18/2015   GRAMSTAIN WBC present-predominately PMN 12/18/2015   GRAMSTAIN No Squamous Epithelial Cells Seen 12/18/2015   GRAMSTAIN No Organisms Seen 12/18/2015   CULT 9,000 COLONIES/mL INSIGNIFICANT GROWTH 03/30/2015   LABORGA NORMAL SKIN FLORA 12/18/2015     Lab Results  Component Value Date   ALBUMIN 4.1 05/18/2016   ALBUMIN 3.2 (L) 10/17/2012   ALBUMIN 1.9 (L) 10/24/2011   LABURIC 4.7 10/17/2006    Body mass index is 24.81 kg/m.  Orders:  Orders Placed This Encounter  Procedures  . XR Pelvis 1-2 Views   No orders of the defined types were placed in this encounter.    Procedures: No procedures performed  Clinical Data: No additional findings.  ROS:  All other systems negative, except as noted in the HPI. Review of Systems  Objective: Vital Signs: Ht 5\' 9"  (1.753 m)   Wt 168 lb (76.2 kg)   BMI 24.81 kg/m   Specialty Comments:  No specialty comments available.  PMFS History: Patient Active Problem List   Diagnosis Date Noted  . Sacral fracture, closed  ([place]) 04/13/2018  . PCO (posterior capsular opacification), bilateral 05/26/2017  . Weakness of right leg 04/20/2017  . 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 Exudative age-related macular degeneration of right eye with active choroidal neovascularization ([place]) 09/30/2016  . Pseudophakia of both eyes 09/30/2016  . Impingement syndrome of right shoulder 06/16/2016  . Impingement syndrome of left shoulder 06/16/2016  . Bilateral leg edema 06/14/2016  . Dizziness and giddiness 06/26/2015  . Abnormality of gait 06/26/2015  . Anxiety 04/14/2015  . GERD (gastroesophageal reflux disease) 04/03/2015  . OSA (obstructive sleep apnea) 04/03/2015  . HLD (hyperlipidemia) 04/03/2015  . SOB (shortness of breath)   . Left elbow fracture 03/29/2015  . Dizziness 01/01/2015  . LBP (low back pain) 01/01/2015  . Malignant neoplasm of prostate ([place]) 01/01/2015  . BP (high blood pressure) 01/01/2015  . DOE (dyspnea on exertion) 10/26/2013  . Essential hypertension, benign 10/26/2013  . Post-traumatic wound infection 10/17/2012  . Cellulitis 10/17/2012  . Atrial fibrillation ([place]) 10/22/2011  . DVT, lower extremity, distal, chronic ([place]) 10/22/2011  . Pacemaker 10/19/2011  . Prostate cancer ([place]) 10/19/2011  . COPD (chronic obstructive pulmonary disease) with emphysema ([place]) 02/04/2011  . Anemia 12/10/2010   Past Medical History:  Diagnosis Date  . Abnormality of gait    due to low back pain , uses  cane and walker  . Arthritis   . Bilateral lower extremity edema    wears TED hose  . Cancer [facility])    prostate- treated with radiation  . Cardiac pacemaker in situ 10/11/2006   medtronic  . Chronic iron deficiency anemia oncologist/ hematoloist-- dr Alen Blew   treatment IV Iron infusions  . Chronic low back pain   . CKD (chronic kidney disease), stage III ([place])   . COPD with emphysema [facility])    pulmologist-  dr Elsworth Soho  . DDD (degenerative disc disease),  lumbosacral   . Degenerative scoliosis   . Dyspnea    with exertion  . Full dentures   . GERD (gastroesophageal reflux disease)   . Gross hematuria   . Headache    history of migraines- "way in the past"  . Hereditary and idiopathic peripheral neuropathy    bilateral lower leg  . Hiatal hernia   . History of DVT of lower extremity 10/19/2011   chronic distal popliteal right lower extremity  . History of external beam radiation therapy 2008   prostate cancer  . History of pancreatitis 2013  . History of pneumothorax 09/2006   iatrogenic-- in setting cardiac pacemaker placement  . History of prostate cancer 2008   s/p  external radiation therapy  . Hypertension   . Lesion of bladder   . Macular degeneration of right eye   . Nocturia   . OSA on CPAP   . PAF (paroxysmal atrial fibrillation) ([place]) 10/2011   1st episode documented 09/  2013 admission for gallstons/ pancreatitis and prior to cholecystectomy , went back in NSR without interventeion  . Presence of permanent cardiac pacemaker   . Sinus node dysfunction (HCC)    s/p  cardiac pacemaker placement 10-11-2006  . Wears glasses   . Wears hearing aid in both ears     Family History  Problem Relation Age of Onset  . Heart disease Father   . Heart disease Mother   . Colon cancer Mother   . Stroke Mother   . Cancer Sister   . Heart disease Sister   . Heart attack Neg Hx   . Hypertension Neg Hx     Past Surgical History:  Procedure Laterality Date  . BALLOON DILATION N/A 01/14/2015   Procedure: BALLOON DILATION;  Surgeon: Garlan Fair, MD;  Location: [facility];  Service: Endoscopy;  Laterality: N/A;  . BALLOON DILATION N/A 10/12/2017   Procedure: BALLOON DILATION;  Surgeon: Otis Brace, MD;  Location: [place] [facility];  Service: Gastroenterology;  Laterality: N/A;  . BIOPSY  10/12/2017   Procedure: BIOPSY;  Surgeon: Otis Brace, MD;  Location: [place] [facility];  Service: Gastroenterology;;  . CARDIAC  CATHETERIZATION  08-29-2000   [place]   no significant obstructive CAD, intact globle LV size and systolic function with regional wall motion abnormalities noted,  ?coronary spasm producing wall motion abnormality, elevated CPK and chest pain (ef 55%)  . CARDIAC PACEMAKER PLACEMENT  10-11-2006    dr Leonia Reeves   W/  ATRIAL LEAD REVISION 10-12-2006   (medtronic)  . CARDIOVASCULAR STRESS TEST  11-18-2011    dr Irish Lack   Low risk nuclear study w/ no ishemia/  normal wall motion,  post-stress ef 48% (LVEF 56% on prior study 2008)  . CARPAL TUNNEL RELEASE Left 11/14/2014   Procedure: CARPAL TUNNEL RELEASE LEFT THUMB;  Surgeon: Daryll Brod, MD;  Location: [place];  Service: Orthopedics;  Laterality: Left;  ANESTHESIA:  IV REGIONAL FAB  . CATARACT EXTRACTION W/ INTRAOCULAR LENS  IMPLANT, BILATERAL    . CHOLECYSTECTOMY  10/23/2011  . CHOLECYSTECTOMY  10/23/2011   Procedure: CHOLECYSTECTOMY;  Surgeon: Rolm Bookbinder, MD;  Location: [facility];  Service: General;  Laterality: N/A;  with intraoperative cholangiogram  . COLONOSCOPY    . ESOPHAGOGASTRODUODENOSCOPY (EGD) WITH PROPOFOL N/A 01/14/2015   Procedure: ESOPHAGOGASTRODUODENOSCOPY (EGD) WITH PROPOFOL;  Surgeon: Garlan Fair, MD;  [facility];  Service: Endoscopy;  Laterality: N/A;  . ESOPHAGOGASTRODUODENOSCOPY (EGD) WITH PROPOFOL N/A 10/12/2017   Procedure: ESOPHAGOGASTRODUODENOSCOPY (EGD) WITH PROPOFOL;  Surgeon: Otis Brace, MD;  Location: [place];  Service: Gastroenterology;  Laterality: N/A;  . I&D EXTREMITY Left 10/20/2012   Procedure: LEFT LEG IRRIGATION AND DEBRIDEMENT, AND WOUND VAC APPLICATION;  Surgeon: Marianna Payment, MD;  [facility];  Service: Orthopedics;  Laterality: Left;  . I&D EXTREMITY Left 10/22/2012   Procedure: IRRIGATION AND DEBRIDEMENT EXTREMITY;  Surgeon: Marianna Payment, MD;  [facility];  Service: Orthopedics;  Laterality: Left;  . INGUINAL HERNIA REPAIR Left yrs ago  .  LAPAROSCOPIC ASSISTED VENTRAL HERNIA REPAIR  08-08-2009   dr Excell Seltzer   AND OPEN REPAIR RECURRENT LEFT INGUINAL HERNIA  . LAPAROSCOPIC INGUINAL HERNIA REPAIR Left 07-24-1999    dr Excell Seltzer  . OPEN VENTRAL HERNIA REPAIR /  LYSIS ADHESIONS  09-23-2010    dr Donne Hazel  . ORIF ELBOW FRACTURE Left 03/29/2015   Procedure: LEFT ELBOW OPEN REDUCTION INTERNAL FIXATION (ORIF) DISTAL HUMERUS FRACTURE WITH OLECRANON OSTEOTOMY AND ULNAR NERVE RELEASE;  Surgeon: Roseanne Kaufman,  MD;  Location: [facility];  Service: Orthopedics;  Laterality: Left;  . PPM GENERATOR CHANGEOUT N/A 02/10/2018   Procedure: PPM GENERATOR CHANGEOUT;  Surgeon: Deboraha Sprang, MD;  Location: [place] CV LAB;  Service: Cardiovascular;  Laterality: N/A;  . REPAIR SUPRAUMBILICAL HERNIA   [phone]   dr Excell Seltzer  . SHOULDER ARTHROSCOPY WITH DISTAL CLAVICLE RESECTION Right 11-07-2007   dr Rhona Raider   [place] ACROMIOPLASTY  . SKIN SPLIT GRAFT Left 10/22/2012   Procedure: SKIN GRAFT SPLIT THICKNESS;  Surgeon: Marianna Payment, MD;  [facility];  Service: Orthopedics;  Laterality: Left;  . TOTAL KNEE ARTHROPLASTY Right 1990's  . TRANSTHORACIC ECHOCARDIOGRAM  06-12-2012   dr Irish Lack   ef 50-55%/ mild LAE/ mild MR and TR/  AV sclerosis without stenosis/  RVSP 34mmHg  . TRANSURETHRAL RESECTION OF BLADDER TUMOR N/A 07/15/2017   Procedure: CYSTOSCOPY TRANSURETHRAL RESECTION OF BLADDER TUMOR (TURBT);  Surgeon: Kathie Rhodes, MD;  [facility];  Service: Urology;  Laterality: N/A;   Social History   Occupational History  . 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 Smoking status: Former Smoker    Packs/day: 1.50    Years: 35.00    Pack years: 52.50    Types: Cigarettes    Last attempt to quit: 02/15/1973    Years since quitting: 45.3  . Smokeless tobacco: Never Used  Substance and Sexual Activity  . Alcohol use: No  . Drug use: No  . Sexual  activity: Never

## 2018-06-28 ENCOUNTER — Ambulatory Visit (INDEPENDENT_AMBULATORY_CARE_PROVIDER_SITE_OTHER): Payer: Medicare Other | Admitting: Podiatry

## 2018-06-28 ENCOUNTER — Ambulatory Visit (INDEPENDENT_AMBULATORY_CARE_PROVIDER_SITE_OTHER): Payer: Medicare Other

## 2018-06-28 ENCOUNTER — Encounter: Payer: Self-pay | Admitting: Podiatry

## 2018-06-28 ENCOUNTER — Encounter: Payer: Self-pay | Admitting: Physician Assistant

## 2018-06-28 ENCOUNTER — Other Ambulatory Visit: Payer: Self-pay

## 2018-06-28 VITALS — Temp 97.5°F

## 2018-06-28 DIAGNOSIS — M79674 Pain in right toe(s): Secondary | ICD-10-CM

## 2018-06-28 DIAGNOSIS — M779 Enthesopathy, unspecified: Secondary | ICD-10-CM | POA: Diagnosis not present

## 2018-06-28 DIAGNOSIS — L97511 Non-pressure chronic ulcer of other part of right foot limited to breakdown of skin: Secondary | ICD-10-CM

## 2018-06-28 MED ORDER — TRIAMCINOLONE ACETONIDE 10 MG/ML IJ SUSP
10.0000 mg | Freq: Once | INTRAMUSCULAR | Status: AC
  Administered 2018-06-28: 10 mg

## 2018-06-29 NOTE — Progress Notes (Signed)
Subjective:   Patient ID: Volney Presser, male   DOB: 85 y.o.   MRN: [id number]   HPI Patient states his right big toe is still bothering him and it seems different where now he gets sharper pain in the joint and it seems like the area where the callus was is improving but still sore with occasional shooting pains occurring more proximal than where it was hurting previously   ROS      Objective:  Physical Exam  Neurovascular status intact with patient found to have inflammation of the inner phalangeal joint right big toe with distal medial keratotic lesion which is improved with no active drainage noted currently     Assessment:  Possibility for inflammatory capsulitis of the inner phalangeal joint right big toe dorsal with a lesion distal medial right which appears to be improving currently     Plan:  I did debride the lesion and I flushed it and I did apply a small amount of Iodosorb to the area gave him some for home usage and I went ahead and I did inject the inner phalangeal joint with 2 mg Dexasone Kenalog to try to reduce the inflammation he is getting and he will be seen back and understands is a difficult problem with probable reduction of circulatory status to the digit as part of the complicating factor

## 2018-07-03 DIAGNOSIS — Z79891 Long term (current) use of opiate analgesic: Secondary | ICD-10-CM | POA: Diagnosis not present

## 2018-07-03 DIAGNOSIS — M4840XS Fatigue fracture of vertebra, site unspecified, sequela of fracture: Secondary | ICD-10-CM | POA: Diagnosis not present

## 2018-07-03 DIAGNOSIS — G894 Chronic pain syndrome: Secondary | ICD-10-CM | POA: Diagnosis not present

## 2018-07-03 DIAGNOSIS — M546 Pain in thoracic spine: Secondary | ICD-10-CM | POA: Diagnosis not present

## 2018-07-12 ENCOUNTER — Ambulatory Visit: Payer: Self-pay

## 2018-07-12 ENCOUNTER — Ambulatory Visit (INDEPENDENT_AMBULATORY_CARE_PROVIDER_SITE_OTHER): Payer: Medicare Other | Admitting: Orthopedic Surgery

## 2018-07-12 ENCOUNTER — Other Ambulatory Visit: Payer: Self-pay

## 2018-07-12 ENCOUNTER — Encounter: Payer: Self-pay | Admitting: Orthopedic Surgery

## 2018-07-12 VITALS — Ht 69.0 in | Wt 168.0 lb

## 2018-07-12 DIAGNOSIS — M79671 Pain in right foot: Secondary | ICD-10-CM | POA: Diagnosis not present

## 2018-07-12 DIAGNOSIS — S32110D Nondisplaced Zone I fracture of sacrum, subsequent encounter for fracture with routine healing: Secondary | ICD-10-CM | POA: Diagnosis not present

## 2018-07-12 DIAGNOSIS — M25871 Other specified joint disorders, right ankle and foot: Secondary | ICD-10-CM

## 2018-07-13 ENCOUNTER — Encounter: Payer: Self-pay | Admitting: Orthopedic Surgery

## 2018-07-13 DIAGNOSIS — M25871 Other specified joint disorders, right ankle and foot: Secondary | ICD-10-CM | POA: Diagnosis not present

## 2018-07-13 MED ORDER — METHYLPREDNISOLONE ACETATE 40 MG/ML IJ SUSP
40.0000 mg | INTRAMUSCULAR | Status: AC | PRN
  Administered 2018-07-13: 40 mg via INTRA_ARTICULAR

## 2018-07-13 MED ORDER — LIDOCAINE HCL 1 % IJ SOLN
2.0000 mL | INTRAMUSCULAR | Status: AC | PRN
  Administered 2018-07-13: 2 mL

## 2018-07-13 NOTE — Progress Notes (Signed)
Office Visit Note   Patient: [name] [name]           Date of Birth: 1924/07/04           MRN: [id number] Visit Date: 07/12/2018              Requested by: [name], MD [place], [place] [address] PCP: [name], MD  Chief Complaint  Patient presents with  . Right Foot - Pain  . Right Ankle - Pain      HPI: Patient is a 85 year old gentleman who presents complaining of pain anteriorly over the right ankle.  He states he has had symptoms for about a month does not remember any injuries but does complain of swelling.  Complains of swelling in the foot as well.  Patient states he still has chronic lower back pain from his sacral insufficiency fracture.  Patient states he does take vitamin D and calcium supplements.  Assessment & Plan: Visit Diagnoses:  1. Pain in right foot   2. Impingement syndrome of right ankle   3. Closed nondisplaced zone I fracture of sacrum with routine healing, subsequent encounter     Plan: The right ankle was injected he tolerated this well.  Recommended compression socks for the right foot but he states that this makes his big toe hurt.  He will continue with his current care.  Follow-Up Instructions: No follow-ups on file.   Ortho Exam  Patient is alert, oriented, no adenopathy, well-dressed, normal affect, normal respiratory effort. Examination patient has generalized tenderness to palpation over the lumbar spine.  His CT scan was reviewed which showed some insufficiency fractures of the sacrum.  Lumbar spine radiographs show significant degenerative disc disease.  Examination the right ankle he is tender to palpation anteriorly over the joint line there is no redness no cellulitis he does have venous stasis swelling but no ulcers.  He does have a callus beneath the arch of the right foot and after informed consent a 10 blade knife was used to pare the callus that was 3 mm in diameter.  Patient also has a small superficial  ulcer beneath the right great toe which was also pared.  Imaging: Xr Foot 2 Views Right  Result Date: 07/13/2018 2 view radiographs of the right foot shows no fractures  No images are attached to the encounter.  Labs: Lab Results  Component Value Date   CRP 16.2 (H) 10/20/2011   LABURIC 4.7 10/17/2006   REPTSTATUS 03/31/2015 FINAL 03/30/2015   GRAMSTAIN Abundant 12/18/2015   GRAMSTAIN WBC present-predominately PMN 12/18/2015   GRAMSTAIN No Squamous Epithelial Cells Seen 12/18/2015   GRAMSTAIN No Organisms Seen 12/18/2015   CULT 9,000 COLONIES/mL INSIGNIFICANT GROWTH 03/30/2015   LABORGA NORMAL SKIN FLORA 12/18/2015     Lab Results  Component Value Date   ALBUMIN 4.1 05/18/2016   ALBUMIN 3.2 (L) 10/17/2012   ALBUMIN 1.9 (L) 10/24/2011   LABURIC 4.7 10/17/2006    Body mass index is 24.81 kg/m.  Orders:  Orders Placed This Encounter  Procedures  . XR Foot 2 Views Right   No orders of the defined types were placed in this encounter.    Procedures: Medium Joint Inj: R ankle on 07/13/2018 1:59 PM Indications: pain and diagnostic evaluation Details: 22 G 1.5 in needle, anteromedial approach Medications: 2 mL lidocaine 1 %; 40 mg methylPREDNISolone acetate 40 MG/ML Outcome: tolerated well, no immediate complications Procedure, treatment alternatives, risks and benefits explained, specific risks  discussed. Consent was given by the patient. Immediately prior to procedure a time out was called to verify the correct patient, procedure, equipment, support staff and site/side marked as required. Patient was prepped and draped in the usual sterile fashion.      Clinical Data: No additional findings.  ROS:  All other systems negative, except as noted in the HPI. Review of Systems  Objective: Vital Signs: Ht 5\' 9"  (1.753 m)   Wt 168 lb (76.2 kg)   BMI 24.81 kg/m   Specialty Comments:  No specialty comments available.  PMFS History: Patient Active Problem List    Diagnosis Date Noted  . Sacral fracture, closed ([place]) 04/13/2018  . PCO (posterior capsular opacification), bilateral 05/26/2017  . Weakness of right leg 04/20/2017  . 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 Exudative age-related macular degeneration of right eye with active choroidal neovascularization ([place]) 09/30/2016  . Pseudophakia of both eyes 09/30/2016  . Impingement syndrome of right shoulder 06/16/2016  . Impingement syndrome of left shoulder 06/16/2016  . Bilateral leg edema 06/14/2016  . Dizziness and giddiness 06/26/2015  . Abnormality of gait 06/26/2015  . Anxiety 04/14/2015  . GERD (gastroesophageal reflux disease) 04/03/2015  . OSA (obstructive sleep apnea) 04/03/2015  . HLD (hyperlipidemia) 04/03/2015  . SOB (shortness of breath)   . Left elbow fracture 03/29/2015  . Dizziness 01/01/2015  . LBP (low back pain) 01/01/2015  . Malignant neoplasm of prostate ([place]) 01/01/2015  . BP (high blood pressure) 01/01/2015  . DOE (dyspnea on exertion) 10/26/2013  . Essential hypertension, benign 10/26/2013  . Post-traumatic wound infection 10/17/2012  . Cellulitis 10/17/2012  . Atrial fibrillation ([place]) 10/22/2011  . DVT, lower extremity, distal, chronic ([place]) 10/22/2011  . Pacemaker 10/19/2011  . Prostate cancer ([place]) 10/19/2011  . COPD (chronic obstructive pulmonary disease) with emphysema ([place]) 02/04/2011  . Anemia 12/10/2010   Past Medical History:  Diagnosis Date  . Abnormality of gait    due to low back pain , uses  cane and walker  . Arthritis   . Bilateral lower extremity edema    wears TED hose  . Cancer [facility])    prostate- treated with radiation  . Cardiac pacemaker in situ 10/11/2006   medtronic  . Chronic iron deficiency anemia oncologist/ hematoloist-- dr Alen Blew   treatment IV Iron infusions  . Chronic low back pain   . CKD (chronic kidney disease), stage III ([place])   . COPD with emphysema [facility])    pulmologist-   dr Elsworth Soho  . DDD (degenerative disc disease), lumbosacral   . Degenerative scoliosis   . Dyspnea    with exertion  . Full dentures   . GERD (gastroesophageal reflux disease)   . Gross hematuria   . Headache    history of migraines- "way in the past"  . Hereditary and idiopathic peripheral neuropathy    bilateral lower leg  . Hiatal hernia   . History of DVT of lower extremity 10/19/2011   chronic distal popliteal right lower extremity  . History of external beam radiation therapy 2008   prostate cancer  . History of pancreatitis 2013  . History of pneumothorax 09/2006   iatrogenic-- in setting cardiac pacemaker placement  . History of prostate cancer 2008   s/p  external radiation therapy  . Hypertension   . Lesion of bladder   . Macular degeneration of right eye   . Nocturia   . OSA on CPAP   .  PAF (paroxysmal atrial fibrillation) ([place]) 10/2011   1st episode documented 09/ 2013 admission for gallstons/ pancreatitis and prior to cholecystectomy , went back in NSR without interventeion  . Presence of permanent cardiac pacemaker   . Sinus node dysfunction ([facility])    s/p  cardiac pacemaker placement 10-11-2006  . Wears glasses   . Wears hearing aid in both ears     Family History  Problem Relation Age of Onset  . Heart disease Father   . Heart disease Mother   . Colon cancer Mother   . Stroke Mother   . Cancer Sister   . Heart disease Sister   . Heart attack Neg Hx   . Hypertension Neg Hx     Past Surgical History:  Procedure Laterality Date  . BALLOON DILATION N/A 01/14/2015   Procedure: BALLOON DILATION;  Surgeon: Garlan Fair, MD;  Location: [facility];  Service: Endoscopy;  Laterality: N/A;  . BALLOON DILATION N/A 10/12/2017   Procedure: BALLOON DILATION;  Surgeon: Otis Brace, MD;  Location: [facility];  Service: Gastroenterology;  Laterality: N/A;  . BIOPSY  10/12/2017   Procedure: BIOPSY;  Surgeon: Otis Brace, MD;  Location: [facility];   Service: Gastroenterology;;  . CARDIAC CATHETERIZATION  08-29-2000   [place]   no significant obstructive CAD, intact globle LV size and systolic function with regional wall motion abnormalities noted,  ?coronary spasm producing wall motion abnormality, elevated CPK and chest pain (ef 55%)  . CARDIAC PACEMAKER PLACEMENT  10-11-2006    dr Leonia Reeves   W/  ATRIAL LEAD REVISION 10-12-2006   (medtronic)  . CARDIOVASCULAR STRESS TEST  11-18-2011    dr Irish Lack   Low risk nuclear study w/ no ishemia/  normal wall motion,  post-stress ef 48% (LVEF 56% on prior study 2008)  . CARPAL TUNNEL RELEASE Left 11/14/2014   Procedure: CARPAL TUNNEL RELEASE LEFT THUMB;  Surgeon: Daryll Brod, MD;  Location: [facility];  Service: Orthopedics;  Laterality: Left;  ANESTHESIA:  IV REGIONAL FAB  . CATARACT EXTRACTION W/ INTRAOCULAR LENS  IMPLANT, BILATERAL    . CHOLECYSTECTOMY  10/23/2011  . CHOLECYSTECTOMY  10/23/2011   Procedure: CHOLECYSTECTOMY;  Surgeon: Rolm Bookbinder, MD;  Location: [facility];  Service: General;  Laterality: N/A;  with intraoperative cholangiogram  . COLONOSCOPY    . ESOPHAGOGASTRODUODENOSCOPY (EGD) WITH PROPOFOL N/A 01/14/2015   Procedure: ESOPHAGOGASTRODUODENOSCOPY (EGD) WITH PROPOFOL;  Surgeon: Garlan Fair, MD;  Location: [facility];  Service: Endoscopy;  Laterality: N/A;  . ESOPHAGOGASTRODUODENOSCOPY (EGD) WITH PROPOFOL N/A 10/12/2017   Procedure: ESOPHAGOGASTRODUODENOSCOPY (EGD) WITH PROPOFOL;  Surgeon: Otis Brace, MD;  Location: [facility];  Service: Gastroenterology;  Laterality: N/A;  . I&D EXTREMITY Left 10/20/2012   Procedure: LEFT LEG IRRIGATION AND DEBRIDEMENT, AND WOUND VAC APPLICATION;  Surgeon: Marianna Payment, MD;  Location: [facility];  Service: Orthopedics;  Laterality: Left;  . I&D EXTREMITY Left 10/22/2012   Procedure: IRRIGATION AND DEBRIDEMENT EXTREMITY;  Surgeon: Marianna Payment, MD;  Location: [facility];  Service: Orthopedics;  Laterality: Left;  . INGUINAL  HERNIA REPAIR Left yrs ago  . LAPAROSCOPIC ASSISTED VENTRAL HERNIA REPAIR  08-08-2009   dr Excell Seltzer   AND OPEN REPAIR RECURRENT LEFT INGUINAL HERNIA  . LAPAROSCOPIC INGUINAL HERNIA REPAIR Left 07-24-1999    dr Excell Seltzer  . OPEN VENTRAL HERNIA REPAIR /  LYSIS ADHESIONS  09-23-2010    dr Donne Hazel  . ORIF ELBOW FRACTURE Left 03/29/2015   Procedure: LEFT ELBOW OPEN REDUCTION INTERNAL FIXATION (ORIF) DISTAL HUMERUS  FRACTURE WITH OLECRANON OSTEOTOMY AND ULNAR NERVE RELEASE;  Surgeon: Roseanne Kaufman, MD;  Location: [facility];  Service: Orthopedics;  Laterality: Left;  . PPM GENERATOR CHANGEOUT N/A 02/10/2018   Procedure: PPM GENERATOR CHANGEOUT;  Surgeon: Deboraha Sprang, MD;  Location: [facility];  Service: Cardiovascular;  Laterality: N/A;  . REPAIR SUPRAUMBILICAL HERNIA   [id number]   dr Excell Seltzer  . SHOULDER ARTHROSCOPY WITH DISTAL CLAVICLE RESECTION Right 11-07-2007   dr Rhona Raider   [place] ACROMIOPLASTY  . SKIN SPLIT GRAFT Left 10/22/2012   Procedure: SKIN GRAFT SPLIT THICKNESS;  Surgeon: Marianna Payment, MD;  Location: [facility];  Service: Orthopedics;  Laterality: Left;  . TOTAL KNEE ARTHROPLASTY Right 1990's  . TRANSTHORACIC ECHOCARDIOGRAM  06-12-2012   dr Irish Lack   ef 50-55%/ mild LAE/ mild MR and TR/  AV sclerosis without stenosis/  RVSP 7mmHg  . TRANSURETHRAL RESECTION OF BLADDER TUMOR N/A 07/15/2017   Procedure: CYSTOSCOPY TRANSURETHRAL RESECTION OF BLADDER TUMOR (TURBT);  Surgeon: Kathie Rhodes, MD;  Location: [facility];  Service: Urology;  Laterality: N/A;   Social History   Occupational History  . 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 Smoking status: Former Smoker    Packs/day: 1.50    Years: 35.00    Pack years: 52.50    Types: Cigarettes    Last attempt to quit: 02/15/1973    Years since quitting: 45.4  . Smokeless tobacco: Never Used  Substance and Sexual Activity  . Alcohol use: No   . Drug use: No  . Sexual activity: Never

## 2018-07-15 ENCOUNTER — Telehealth: Payer: Self-pay

## 2018-07-15 DIAGNOSIS — Z20822 Contact with and (suspected) exposure to covid-19: Secondary | ICD-10-CM

## 2018-07-15 NOTE — Telephone Encounter (Signed)
Pt asymptomatic of covid s/sx. appt scheduled for 07/16/18 @2p  [place] location

## 2018-07-16 ENCOUNTER — Other Ambulatory Visit: Payer: Self-pay

## 2018-07-17 LAB — NOVEL CORONAVIRUS, NAA: SARS-CoV-2, NAA: NOT DETECTED

## 2018-07-26 ENCOUNTER — Other Ambulatory Visit (INDEPENDENT_AMBULATORY_CARE_PROVIDER_SITE_OTHER): Payer: Self-pay | Admitting: Orthopedic Surgery

## 2018-07-31 ENCOUNTER — Encounter: Payer: Self-pay | Admitting: Orthopedic Surgery

## 2018-07-31 ENCOUNTER — Ambulatory Visit (INDEPENDENT_AMBULATORY_CARE_PROVIDER_SITE_OTHER): Payer: Medicare Other | Admitting: Orthopedic Surgery

## 2018-07-31 ENCOUNTER — Other Ambulatory Visit: Payer: Self-pay

## 2018-07-31 VITALS — Ht 69.0 in | Wt 168.0 lb

## 2018-07-31 DIAGNOSIS — I87333 Chronic venous hypertension (idiopathic) with ulcer and inflammation of bilateral lower extremity: Secondary | ICD-10-CM | POA: Diagnosis not present

## 2018-07-31 DIAGNOSIS — M25871 Other specified joint disorders, right ankle and foot: Secondary | ICD-10-CM | POA: Diagnosis not present

## 2018-07-31 DIAGNOSIS — L97929 Non-pressure chronic ulcer of unspecified part of left lower leg with unspecified severity: Secondary | ICD-10-CM

## 2018-07-31 DIAGNOSIS — L97919 Non-pressure chronic ulcer of unspecified part of right lower leg with unspecified severity: Secondary | ICD-10-CM

## 2018-08-01 ENCOUNTER — Encounter: Payer: Self-pay | Admitting: Orthopedic Surgery

## 2018-08-01 DIAGNOSIS — M25871 Other specified joint disorders, right ankle and foot: Secondary | ICD-10-CM

## 2018-08-01 MED ORDER — METHYLPREDNISOLONE ACETATE 40 MG/ML IJ SUSP
40.0000 mg | INTRAMUSCULAR | Status: AC | PRN
  Administered 2018-08-01: 10:00:00 40 mg via INTRA_ARTICULAR

## 2018-08-01 MED ORDER — LIDOCAINE HCL 1 % IJ SOLN
2.0000 mL | INTRAMUSCULAR | Status: AC | PRN
  Administered 2018-08-01: 2 mL

## 2018-08-01 NOTE — Progress Notes (Signed)
Office Visit Note   Patient: [name] [name]           Date of Birth: Jul 25, 1924           MRN: [id number] Visit Date: 07/31/2018              Requested by: [name], MD [place],  [place] [address] PCP: [name], MD  Chief Complaint  Patient presents with  . Right Ankle - Follow-up      HPI: Patient is a 85 year old gentleman who presents stating that the right ankle injection did help with the medial ankle pain but did not help with the lateral ankle pain.  Assessment & Plan: Visit Diagnoses:  1. Impingement syndrome of right ankle   2. Idiopathic chronic venous hypertension of both lower extremities with ulcer and inflammation (HCC)     Plan: The right ankle was injected without complications.  Patient will continue with recommendations for the venous insufficiency continue with activities as tolerated follow-up as needed  Follow-Up Instructions: Return if symptoms worsen or fail to improve.   Ortho Exam  Patient is alert, oriented, no adenopathy, well-dressed, normal affect, normal respiratory effort. Examination patient has venous insufficiency with brawny skin color changes in both legs but no open ulcers.  He does have decreased range of motion of the right ankle with osteoarthritis and also has hallux rigidus of the right great toe with dorsiflexion only to 30 degrees this does overload of the ankle with ambulation.  He is point tender to palpation over the anterior lateral joint line.  The medial joint line is nontender to palpation the tendons are nontender to palpation there is no redness no cellulitis no signs of infection or acute inflammatory process.  Imaging: No results found. No images are attached to the encounter.  Labs: Lab Results  Component Value Date   CRP 16.2 (H) 10/20/2011   LABURIC 4.7 10/17/2006   REPTSTATUS 03/31/2015 FINAL 03/30/2015   GRAMSTAIN Abundant 12/18/2015   GRAMSTAIN WBC present-predominately PMN  12/18/2015   GRAMSTAIN No Squamous Epithelial Cells Seen 12/18/2015   GRAMSTAIN No Organisms Seen 12/18/2015   CULT 9,000 COLONIES/mL INSIGNIFICANT GROWTH 03/30/2015   LABORGA NORMAL SKIN FLORA 12/18/2015     Lab Results  Component Value Date   ALBUMIN 4.1 05/18/2016   ALBUMIN 3.2 (L) 10/17/2012   ALBUMIN 1.9 (L) 10/24/2011   LABURIC 4.7 10/17/2006    Body mass index is 24.81 kg/m.  Orders:  No orders of the defined types were placed in this encounter.  No orders of the defined types were placed in this encounter.    Procedures: Medium Joint Inj: R ankle on 08/01/2018 10:08 AM Indications: pain and diagnostic evaluation Details: 22 G 1.5 in needle, anterolateral approach Medications: 2 mL lidocaine 1 %; 40 mg methylPREDNISolone acetate 40 MG/ML Outcome: tolerated well, no immediate complications Procedure, treatment alternatives, risks and benefits explained, specific risks discussed. Consent was given by the patient. Immediately prior to procedure a time out was called to verify the correct patient, procedure, equipment, support staff and site/side marked as required. Patient was prepped and draped in the usual sterile fashion.      Clinical Data: No additional findings.  ROS:  All other systems negative, except as noted in the HPI. Review of Systems  Objective: Vital Signs: Ht 5\' 9"  (1.753 m)   Wt 168 lb (76.2 kg)   BMI 24.81 kg/m   Specialty Comments:  No specialty comments available.  PMFS History: Patient Active Problem List   Diagnosis Date Noted  . Sacral fracture, closed ([place]) 04/13/2018  . PCO (posterior capsular opacification), bilateral 05/26/2017  . Weakness of right leg 04/20/2017  . 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 Exudative age-related macular degeneration of right eye with active choroidal neovascularization ([place]) 09/30/2016  . Pseudophakia of both eyes 09/30/2016  . Impingement syndrome of  right shoulder 06/16/2016  . Impingement syndrome of left shoulder 06/16/2016  . Bilateral leg edema 06/14/2016  . Dizziness and giddiness 06/26/2015  . Abnormality of gait 06/26/2015  . Anxiety 04/14/2015  . GERD (gastroesophageal reflux disease) 04/03/2015  . OSA (obstructive sleep apnea) 04/03/2015  . HLD (hyperlipidemia) 04/03/2015  . SOB (shortness of breath)   . Left elbow fracture 03/29/2015  . Dizziness 01/01/2015  . LBP (low back pain) 01/01/2015  . Malignant neoplasm of prostate ([place]) 01/01/2015  . BP (high blood pressure) 01/01/2015  . DOE (dyspnea on exertion) 10/26/2013  . Essential hypertension, benign 10/26/2013  . Post-traumatic wound infection 10/17/2012  . Cellulitis 10/17/2012  . Atrial fibrillation ([place]) 10/22/2011  . DVT, lower extremity, distal, chronic ([place]) 10/22/2011  . Pacemaker 10/19/2011  . Prostate cancer ([place]) 10/19/2011  . COPD (chronic obstructive pulmonary disease) with emphysema ([place]) 02/04/2011  . Anemia 12/10/2010   Past Medical History:  Diagnosis Date  . Abnormality of gait    due to low back pain , uses  cane and walker  . Arthritis   . Bilateral lower extremity edema    wears TED hose  . Cancer [facility])    prostate- treated with radiation  . Cardiac pacemaker in situ 10/11/2006   medtronic  . Chronic iron deficiency anemia oncologist/ hematoloist-- dr Alen Blew   treatment IV Iron infusions  . Chronic low back pain   . CKD (chronic kidney disease), stage III ([place])   . COPD with emphysema [facility])    pulmologist-  dr Elsworth Soho  . DDD (degenerative disc disease), lumbosacral   . Degenerative scoliosis   . Dyspnea    with exertion  . Full dentures   . GERD (gastroesophageal reflux disease)   . Gross hematuria   . Headache    history of migraines- "way in the past"  . Hereditary and idiopathic peripheral neuropathy    bilateral lower leg  . Hiatal hernia   . History of DVT of lower extremity 10/19/2011   chronic distal popliteal right  lower extremity  . History of external beam radiation therapy 2008   prostate cancer  . History of pancreatitis 2013  . History of pneumothorax 09/2006   iatrogenic-- in setting cardiac pacemaker placement  . History of prostate cancer 2008   s/p  external radiation therapy  . Hypertension   . Lesion of bladder   . Macular degeneration of right eye   . Nocturia   . OSA on CPAP   . PAF (paroxysmal atrial fibrillation) ([place]) 10/2011   1st episode documented 09/ 2013 admission for gallstons/ pancreatitis and prior to cholecystectomy , went back in NSR without interventeion  . Presence of permanent cardiac pacemaker   . Sinus node dysfunction (HCC)    s/p  cardiac pacemaker placement 10-11-2006  . Wears glasses   . Wears hearing aid in both ears     Family History  Problem Relation Age of Onset  . Heart disease Father   . Heart disease Mother   . Colon cancer Mother   . Stroke  Mother   . Cancer Sister   . Heart disease Sister   . Heart attack Neg Hx   . Hypertension Neg Hx     Past Surgical History:  Procedure Laterality Date  . BALLOON DILATION N/A 01/14/2015   Procedure: BALLOON DILATION;  Surgeon: Garlan Fair, MD;  Location: [facility];  Service: Endoscopy;  Laterality: N/A;  . BALLOON DILATION N/A 10/12/2017   Procedure: BALLOON DILATION;  Surgeon: Otis Brace, MD;  Location: [facility];  Service: Gastroenterology;  Laterality: N/A;  . BIOPSY  10/12/2017   Procedure: BIOPSY;  Surgeon: Otis Brace, MD;  Location: [facility];  Service: Gastroenterology;;  . CARDIAC CATHETERIZATION  08-29-2000   [place]   no significant obstructive CAD, intact globle LV size and systolic function with regional wall motion abnormalities noted,  ?coronary spasm producing wall motion abnormality, elevated CPK and chest pain (ef 55%)  . CARDIAC PACEMAKER PLACEMENT  10-11-2006    dr Leonia Reeves   W/  ATRIAL LEAD REVISION 10-12-2006   (medtronic)  . CARDIOVASCULAR STRESS TEST   11-18-2011    dr Irish Lack   Low risk nuclear study w/ no ishemia/  normal wall motion,  post-stress ef 48% (LVEF 56% on prior study 2008)  . CARPAL TUNNEL RELEASE Left 11/14/2014   Procedure: CARPAL TUNNEL RELEASE LEFT THUMB;  Surgeon: Daryll Brod, MD;  Location: [facility];  Service: Orthopedics;  Laterality: Left;  ANESTHESIA:  IV REGIONAL FAB  . CATARACT EXTRACTION W/ INTRAOCULAR LENS  IMPLANT, BILATERAL    . CHOLECYSTECTOMY  10/23/2011  . CHOLECYSTECTOMY  10/23/2011   Procedure: CHOLECYSTECTOMY;  Surgeon: Rolm Bookbinder, MD;  Location: [facility];  Service: General;  Laterality: N/A;  with intraoperative cholangiogram  . COLONOSCOPY    . ESOPHAGOGASTRODUODENOSCOPY (EGD) WITH PROPOFOL N/A 01/14/2015   Procedure: ESOPHAGOGASTRODUODENOSCOPY (EGD) WITH PROPOFOL;  Surgeon: Garlan Fair, MD;  [facility];  Service: Endoscopy;  Laterality: N/A;  . ESOPHAGOGASTRODUODENOSCOPY (EGD) WITH PROPOFOL N/A 10/12/2017   Procedure: ESOPHAGOGASTRODUODENOSCOPY (EGD) WITH PROPOFOL;  Surgeon: Otis Brace, MD;  Location: [facility];  Service: Gastroenterology;  Laterality: N/A;  . I&D EXTREMITY Left 10/20/2012   Procedure: LEFT LEG IRRIGATION AND DEBRIDEMENT, AND WOUND VAC APPLICATION;  Surgeon: Marianna Payment, MD;  [facility];  Service: Orthopedics;  Laterality: Left;  . I&D EXTREMITY Left 10/22/2012   Procedure: IRRIGATION AND DEBRIDEMENT EXTREMITY;  Surgeon: Marianna Payment, MD;  [facility];  Service: Orthopedics;  Laterality: Left;  . INGUINAL HERNIA REPAIR Left yrs ago  . LAPAROSCOPIC ASSISTED VENTRAL HERNIA REPAIR  08-08-2009   dr Excell Seltzer   AND OPEN REPAIR RECURRENT LEFT INGUINAL HERNIA  . LAPAROSCOPIC INGUINAL HERNIA REPAIR Left 07-24-1999    dr Excell Seltzer  . OPEN VENTRAL HERNIA REPAIR /  LYSIS ADHESIONS  09-23-2010    dr Donne Hazel  . ORIF ELBOW FRACTURE Left 03/29/2015   Procedure: LEFT ELBOW OPEN REDUCTION INTERNAL FIXATION (ORIF) DISTAL HUMERUS FRACTURE WITH  OLECRANON OSTEOTOMY AND ULNAR NERVE RELEASE;  Surgeon: Roseanne Kaufman, MD;  Location: [facility];  Service: Orthopedics;  Laterality: Left;  . PPM GENERATOR CHANGEOUT N/A 02/10/2018   Procedure: PPM GENERATOR CHANGEOUT;  Surgeon: Deboraha Sprang, MD;  Location: [facility];  Service: Cardiovascular;  Laterality: N/A;  . REPAIR SUPRAUMBILICAL HERNIA   [phone]   dr Excell Seltzer  . SHOULDER ARTHROSCOPY WITH DISTAL CLAVICLE RESECTION Right 11-07-2007   dr Rhona Raider   [place] ACROMIOPLASTY  . SKIN SPLIT GRAFT Left 10/22/2012  Procedure: SKIN GRAFT SPLIT THICKNESS;  Surgeon: Marianna Payment, MD;  [facility];  Service: Orthopedics;  Laterality: Left;  . TOTAL KNEE ARTHROPLASTY Right 1990's  . TRANSTHORACIC ECHOCARDIOGRAM  06-12-2012   dr Irish Lack   ef 50-55%/ mild LAE/ mild MR and TR/  AV sclerosis without stenosis/  RVSP 10mmHg  . TRANSURETHRAL RESECTION OF BLADDER TUMOR N/A 07/15/2017   Procedure: CYSTOSCOPY TRANSURETHRAL RESECTION OF BLADDER TUMOR (TURBT);  Surgeon: Kathie Rhodes, MD;  Location: [facility];  Service: Urology;  Laterality: N/A;   Social History   Occupational History  . 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 Smoking status: Former Smoker    Packs/day: 1.50    Years: 35.00    Pack years: 52.50    Types: Cigarettes    Quit date: 02/15/1973    Years since quitting: 45.4  . Smokeless tobacco: Never Used  Substance and Sexual Activity  . Alcohol use: No  . Drug use: No  . Sexual activity: Never

## 2018-08-10 DIAGNOSIS — Z8551 Personal history of malignant neoplasm of bladder: Secondary | ICD-10-CM | POA: Diagnosis not present

## 2018-08-14 DIAGNOSIS — M4840XS Fatigue fracture of vertebra, site unspecified, sequela of fracture: Secondary | ICD-10-CM | POA: Diagnosis not present

## 2018-08-14 DIAGNOSIS — G894 Chronic pain syndrome: Secondary | ICD-10-CM | POA: Diagnosis not present

## 2018-08-14 DIAGNOSIS — M546 Pain in thoracic spine: Secondary | ICD-10-CM | POA: Diagnosis not present

## 2018-08-14 DIAGNOSIS — Z79891 Long term (current) use of opiate analgesic: Secondary | ICD-10-CM | POA: Diagnosis not present

## 2018-08-24 ENCOUNTER — Ambulatory Visit (INDEPENDENT_AMBULATORY_CARE_PROVIDER_SITE_OTHER): Payer: Medicare Other | Admitting: *Deleted

## 2018-08-24 DIAGNOSIS — I480 Paroxysmal atrial fibrillation: Secondary | ICD-10-CM | POA: Diagnosis not present

## 2018-08-24 DIAGNOSIS — I495 Sick sinus syndrome: Secondary | ICD-10-CM

## 2018-08-24 LAB — CUP PACEART REMOTE DEVICE CHECK
Battery Remaining Longevity: 157 mo
Battery Voltage: 3.16 V
Brady Statistic AP VP Percent: 1.82 %
Brady Statistic AP VS Percent: 63.03 %
Brady Statistic AS VP Percent: 0.14 %
Brady Statistic AS VS Percent: 35.01 %
Brady Statistic RA Percent Paced: 63.77 %
Brady Statistic RV Percent Paced: 1.96 %
Date Time Interrogation Session: 20200709021900
Implantable Lead Implant Date: 20080826
Implantable Lead Implant Date: 20080826
Implantable Lead Location: 753859
Implantable Lead Location: 753860
Implantable Lead Model: 5076
Implantable Lead Model: 5076
Implantable Pulse Generator Implant Date: 20191227
Lead Channel Impedance Value: 285 Ohm
Lead Channel Impedance Value: 361 Ohm
Lead Channel Impedance Value: 380 Ohm
Lead Channel Impedance Value: 437 Ohm
Lead Channel Pacing Threshold Amplitude: 0.875 V
Lead Channel Pacing Threshold Amplitude: 1.625 V
Lead Channel Pacing Threshold Pulse Width: 0.4 ms
Lead Channel Pacing Threshold Pulse Width: 0.4 ms
Lead Channel Sensing Intrinsic Amplitude: 1.625 mV
Lead Channel Sensing Intrinsic Amplitude: 2.875 mV
Lead Channel Setting Pacing Amplitude: 1.75 V
Lead Channel Setting Pacing Amplitude: 3.5 V
Lead Channel Setting Pacing Pulse Width: 0.4 ms
Lead Channel Setting Sensing Sensitivity: 1.2 mV

## 2018-09-01 NOTE — Progress Notes (Signed)
Remote pacemaker transmission.   

## 2018-09-04 ENCOUNTER — Encounter: Payer: Self-pay | Admitting: Orthopedic Surgery

## 2018-09-04 ENCOUNTER — Ambulatory Visit (INDEPENDENT_AMBULATORY_CARE_PROVIDER_SITE_OTHER): Payer: Medicare Other | Admitting: Orthopedic Surgery

## 2018-09-04 ENCOUNTER — Other Ambulatory Visit: Payer: Self-pay

## 2018-09-04 VITALS — Ht 69.0 in | Wt 168.0 lb

## 2018-09-04 DIAGNOSIS — M7541 Impingement syndrome of right shoulder: Secondary | ICD-10-CM

## 2018-09-08 ENCOUNTER — Encounter: Payer: Self-pay | Admitting: Orthopedic Surgery

## 2018-09-08 DIAGNOSIS — M7541 Impingement syndrome of right shoulder: Secondary | ICD-10-CM

## 2018-09-08 MED ORDER — LIDOCAINE HCL 1 % IJ SOLN
5.0000 mL | INTRAMUSCULAR | Status: AC | PRN
  Administered 2018-09-08: 5 mL

## 2018-09-08 MED ORDER — METHYLPREDNISOLONE ACETATE 40 MG/ML IJ SUSP
40.0000 mg | INTRAMUSCULAR | Status: AC | PRN
  Administered 2018-09-08: 16:00:00 40 mg via INTRA_ARTICULAR

## 2018-09-08 NOTE — Progress Notes (Signed)
Office Visit Note   Patient: [name] [name]           Date of Birth: January 07, 1925           MRN: [id number] Visit Date: 09/04/2018              Requested by: [name], MD [place],  [place] [address] PCP: [name], MD  Chief Complaint  Patient presents with  . Right Upper Arm - Pain  . Right Shoulder - Pain      HPI: Patient is a 85 year old gentleman who is status post a right L ankle injection about a month ago.  Patient states he has increasing pain of the right shoulder he cannot raise or lift his right shoulder.  He states the pain radiates from the biceps towards the shoulder describes this as a stabbing pain denies any falls he states his symptoms have been getting worse over the past 2 weeks.  Patient also complains of pain at the MTP joint of the right great toe.  Assessment & Plan: Visit Diagnoses:  1. Impingement syndrome of right shoulder     Plan: Shoulder was injected he tolerated this well follow-up as needed.  Recommended a stiff soled sneaker for the hallux rigidus of the right great toe.  Follow-Up Instructions: Return if symptoms worsen or fail to improve.   Ortho Exam  Patient is alert, oriented, no adenopathy, well-dressed, normal affect, normal respiratory effort. Examination patient has abduction of flexion of the right shoulder of 70 degrees.  He only has about 20 degrees of internal and external rotation Neer and Hawkins impingement test are painful drop arm test is painful he has pain to palpation over the biceps tendon.  Imaging: No results found. No images are attached to the encounter.  Labs: Lab Results  Component Value Date   CRP 16.2 (H) 10/20/2011   LABURIC 4.7 10/17/2006   REPTSTATUS 03/31/2015 FINAL 03/30/2015   GRAMSTAIN Abundant 12/18/2015   GRAMSTAIN WBC present-predominately PMN 12/18/2015   GRAMSTAIN No Squamous Epithelial Cells Seen 12/18/2015   GRAMSTAIN No Organisms Seen 12/18/2015   CULT 9,000  COLONIES/mL INSIGNIFICANT GROWTH 03/30/2015   LABORGA NORMAL SKIN FLORA 12/18/2015     Lab Results  Component Value Date   ALBUMIN 4.1 05/18/2016   ALBUMIN 3.2 (L) 10/17/2012   ALBUMIN 1.9 (L) 10/24/2011   LABURIC 4.7 10/17/2006    Lab Results  Component Value Date   MG 1.8 08/06/2015   MG 1.7 10/20/2011   No results found for: VD25OH  No results found for: PREALBUMIN CBC EXTENDED Latest Ref Rng & Units 02/10/2018 07/15/2017 05/17/2017  WBC 4.0 - 10.3 K/uL - - 7.4  RBC 4.20 - 5.82 MIL/uL - - 3.35(L)  HGB 13.0 - 17.0 g/dL 10.9(L) 9.9(L) 11.0(L)  HCT 39.0 - 52.0 % 32.0(L) 29.0(L) 33.2(L)  PLT 140 - 400 K/uL - - 148  NEUTROABS 1.5 - 6.5 K/uL - - 5.6  LYMPHSABS 0.9 - 3.3 K/uL - - 0.9     Body mass index is 24.81 kg/m.  Orders:  No orders of the defined types were placed in this encounter.  No orders of the defined types were placed in this encounter.    Procedures: Large Joint Inj: R subacromial bursa on 09/08/2018 3:49 PM Indications: diagnostic evaluation and pain Details: 22 G 1.5 in needle, posterior approach  Arthrogram: No  Medications: 5 mL lidocaine 1 %; 40 mg methylPREDNISolone acetate 40 MG/ML Outcome: tolerated well,  no immediate complications Procedure, treatment alternatives, risks and benefits explained, specific risks discussed. Consent was given by the patient. Immediately prior to procedure a time out was called to verify the correct patient, procedure, equipment, support staff and site/side marked as required. Patient was prepped and draped in the usual sterile fashion.      Clinical Data: No additional findings.  ROS:  All other systems negative, except as noted in the HPI. Review of Systems  Objective: Vital Signs: Ht 5\' 9"  (1.753 m)   Wt 168 lb (76.2 kg)   BMI 24.81 kg/m   Specialty Comments:  No specialty comments available.  PMFS History: Patient Active Problem List   Diagnosis Date Noted  . Sacral fracture, closed ([place])  04/13/2018  . PCO (posterior capsular opacification), bilateral 05/26/2017  . Weakness of right leg 04/20/2017  . 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 Exudative age-related macular degeneration of right eye with active choroidal neovascularization ([place]) 09/30/2016  . Pseudophakia of both eyes 09/30/2016  . Impingement syndrome of right shoulder 06/16/2016  . Impingement syndrome of left shoulder 06/16/2016  . Bilateral leg edema 06/14/2016  . Dizziness and giddiness 06/26/2015  . Abnormality of gait 06/26/2015  . Anxiety 04/14/2015  . GERD (gastroesophageal reflux disease) 04/03/2015  . OSA (obstructive sleep apnea) 04/03/2015  . HLD (hyperlipidemia) 04/03/2015  . SOB (shortness of breath)   . Left elbow fracture 03/29/2015  . Dizziness 01/01/2015  . LBP (low back pain) 01/01/2015  . Malignant neoplasm of prostate ([place]) 01/01/2015  . BP (high blood pressure) 01/01/2015  . DOE (dyspnea on exertion) 10/26/2013  . Essential hypertension, benign 10/26/2013  . Post-traumatic wound infection 10/17/2012  . Cellulitis 10/17/2012  . Atrial fibrillation ([place]) 10/22/2011  . DVT, lower extremity, distal, chronic ([place]) 10/22/2011  . Pacemaker 10/19/2011  . Prostate cancer ([place]) 10/19/2011  . COPD (chronic obstructive pulmonary disease) with emphysema ([place]) 02/04/2011  . Anemia 12/10/2010   Past Medical History:  Diagnosis Date  . Abnormality of gait    due to low back pain , uses  cane and walker  . Arthritis   . Bilateral lower extremity edema    wears TED hose  . Cancer [facility])    prostate- treated with radiation  . Cardiac pacemaker in situ 10/11/2006   medtronic  . Chronic iron deficiency anemia oncologist/ hematoloist-- dr Alen Blew   treatment IV Iron infusions  . Chronic low back pain   . CKD (chronic kidney disease), stage III ([place])   . COPD with emphysema [facility])    pulmologist-  dr Elsworth Soho  . DDD (degenerative disc disease),  lumbosacral   . Degenerative scoliosis   . Dyspnea    with exertion  . Full dentures   . GERD (gastroesophageal reflux disease)   . Gross hematuria   . Headache    history of migraines- "way in the past"  . Hereditary and idiopathic peripheral neuropathy    bilateral lower leg  . Hiatal hernia   . History of DVT of lower extremity 10/19/2011   chronic distal popliteal right lower extremity  . History of external beam radiation therapy 2008   prostate cancer  . History of pancreatitis 2013  . History of pneumothorax 09/2006   iatrogenic-- in setting cardiac pacemaker placement  . History of prostate cancer 2008   s/p  external radiation therapy  . Hypertension   . Lesion of bladder   . Macular degeneration of right eye   .  Nocturia   . OSA on CPAP   . PAF (paroxysmal atrial fibrillation) ([place]) 10/2011   1st episode documented 09/ 2013 admission for gallstons/ pancreatitis and prior to cholecystectomy , went back in NSR without interventeion  . Presence of permanent cardiac pacemaker   . Sinus node dysfunction (HCC)    s/p  cardiac pacemaker placement 10-11-2006  . Wears glasses   . Wears hearing aid in both ears     Family History  Problem Relation Age of Onset  . Heart disease Father   . Heart disease Mother   . Colon cancer Mother   . Stroke Mother   . Cancer Sister   . Heart disease Sister   . Heart attack Neg Hx   . Hypertension Neg Hx     Past Surgical History:  Procedure Laterality Date  . BALLOON DILATION N/A 01/14/2015   Procedure: BALLOON DILATION;  Surgeon: Garlan Fair, MD;  [facility];  Service: Endoscopy;  Laterality: N/A;  . BALLOON DILATION N/A 10/12/2017   Procedure: BALLOON DILATION;  Surgeon: Otis Brace, MD;  Location: [facility];  Service: Gastroenterology;  Laterality: N/A;  . BIOPSY  10/12/2017   Procedure: BIOPSY;  Surgeon: Otis Brace, MD;  Location: [facility];  Service: Gastroenterology;;  . CARDIAC  CATHETERIZATION  08-29-2000   [place]   no significant obstructive CAD, intact globle LV size and systolic function with regional wall motion abnormalities noted,  ?coronary spasm producing wall motion abnormality, elevated CPK and chest pain (ef 55%)  . CARDIAC PACEMAKER PLACEMENT  10-11-2006    dr Leonia Reeves   W/  ATRIAL LEAD REVISION 10-12-2006   (medtronic)  . CARDIOVASCULAR STRESS TEST  11-18-2011    dr Irish Lack   Low risk nuclear study w/ no ishemia/  normal wall motion,  post-stress ef 48% (LVEF 56% on prior study 2008)  . CARPAL TUNNEL RELEASE Left 11/14/2014   Procedure: CARPAL TUNNEL RELEASE LEFT THUMB;  Surgeon: Daryll Brod, MD;  Location: [facility];  Service: Orthopedics;  Laterality: Left;  ANESTHESIA:  IV REGIONAL FAB  . CATARACT EXTRACTION W/ INTRAOCULAR LENS  IMPLANT, BILATERAL    . CHOLECYSTECTOMY  10/23/2011  . CHOLECYSTECTOMY  10/23/2011   Procedure: CHOLECYSTECTOMY;  Surgeon: Rolm Bookbinder, MD;  Location: [facility];  Service: General;  Laterality: N/A;  with intraoperative cholangiogram  . COLONOSCOPY    . ESOPHAGOGASTRODUODENOSCOPY (EGD) WITH PROPOFOL N/A 01/14/2015   Procedure: ESOPHAGOGASTRODUODENOSCOPY (EGD) WITH PROPOFOL;  Surgeon: Garlan Fair, MD;  [facility];  Service: Endoscopy;  Laterality: N/A;  . ESOPHAGOGASTRODUODENOSCOPY (EGD) WITH PROPOFOL N/A 10/12/2017   Procedure: ESOPHAGOGASTRODUODENOSCOPY (EGD) WITH PROPOFOL;  Surgeon: Otis Brace, MD;  Location: [facility];  Service: Gastroenterology;  Laterality: N/A;  . I&D EXTREMITY Left 10/20/2012   Procedure: LEFT LEG IRRIGATION AND DEBRIDEMENT, AND WOUND VAC APPLICATION;  Surgeon: Marianna Payment, MD;  [facility];  Service: Orthopedics;  Laterality: Left;  . I&D EXTREMITY Left 10/22/2012   Procedure: IRRIGATION AND DEBRIDEMENT EXTREMITY;  Surgeon: Marianna Payment, MD;  [facility];  Service: Orthopedics;  Laterality: Left;  . INGUINAL HERNIA REPAIR Left yrs ago  .  LAPAROSCOPIC ASSISTED VENTRAL HERNIA REPAIR  08-08-2009   dr Excell Seltzer   AND OPEN REPAIR RECURRENT LEFT INGUINAL HERNIA  . LAPAROSCOPIC INGUINAL HERNIA REPAIR Left 07-24-1999    dr Excell Seltzer  . OPEN VENTRAL HERNIA REPAIR /  LYSIS ADHESIONS  09-23-2010    dr Donne Hazel  . ORIF ELBOW FRACTURE Left 03/29/2015  Procedure: LEFT ELBOW OPEN REDUCTION INTERNAL FIXATION (ORIF) DISTAL HUMERUS FRACTURE WITH OLECRANON OSTEOTOMY AND ULNAR NERVE RELEASE;  Surgeon: Roseanne Kaufman, MD;  Location: [facility];  Service: Orthopedics;  Laterality: Left;  . PPM GENERATOR CHANGEOUT N/A 02/10/2018   Procedure: PPM GENERATOR CHANGEOUT;  Surgeon: Deboraha Sprang, MD;  Location: [facility];  Service: Cardiovascular;  Laterality: N/A;  . REPAIR SUPRAUMBILICAL HERNIA   [phone]   dr Excell Seltzer  . SHOULDER ARTHROSCOPY WITH DISTAL CLAVICLE RESECTION Right 11-07-2007   dr Rhona Raider   [place] ACROMIOPLASTY  . SKIN SPLIT GRAFT Left 10/22/2012   Procedure: SKIN GRAFT SPLIT THICKNESS;  Surgeon: Marianna Payment, MD;  [facility];  Service: Orthopedics;  Laterality: Left;  . TOTAL KNEE ARTHROPLASTY Right 1990's  . TRANSTHORACIC ECHOCARDIOGRAM  06-12-2012   dr Irish Lack   ef 50-55%/ mild LAE/ mild MR and TR/  AV sclerosis without stenosis/  RVSP 73mmHg  . TRANSURETHRAL RESECTION OF BLADDER TUMOR N/A 07/15/2017   Procedure: CYSTOSCOPY TRANSURETHRAL RESECTION OF BLADDER TUMOR (TURBT);  Surgeon: Kathie Rhodes, MD;  Location[facility];  Service: Urology;  Laterality: N/A;   Social History   Occupational History  . 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 Smoking status: Former Smoker    Packs/day: 1.50    Years: 35.00    Pack years: 52.50    Types: Cigarettes    Quit date: 02/15/1973    Years since quitting: 45.5  . Smokeless tobacco: Never Used  Substance and Sexual Activity  . Alcohol use: No  . Drug use: No  . Sexual activity:  Never

## 2018-09-13 ENCOUNTER — Inpatient Hospital Stay: Payer: Medicare Other

## 2018-09-13 ENCOUNTER — Telehealth: Payer: Self-pay

## 2018-09-13 ENCOUNTER — Inpatient Hospital Stay: Payer: Medicare Other | Admitting: Oncology

## 2018-09-13 NOTE — Telephone Encounter (Signed)
Called patient to see if he was coming for his 2:30 lab appointment and 3:00 MD visit with Dr. Alen Blew. Patient stated he left a message with scheduling last week to cancel appointment and he does not wish to reschedule. Patient will call the office if he decides to reschedule. Dr. Alen Blew made aware.

## 2018-09-18 ENCOUNTER — Encounter: Payer: Self-pay | Admitting: Podiatry

## 2018-09-18 ENCOUNTER — Other Ambulatory Visit: Payer: Self-pay

## 2018-09-18 ENCOUNTER — Ambulatory Visit (INDEPENDENT_AMBULATORY_CARE_PROVIDER_SITE_OTHER): Payer: Medicare Other | Admitting: Podiatry

## 2018-09-18 DIAGNOSIS — M79674 Pain in right toe(s): Secondary | ICD-10-CM | POA: Diagnosis not present

## 2018-09-18 DIAGNOSIS — M79675 Pain in left toe(s): Secondary | ICD-10-CM

## 2018-09-18 DIAGNOSIS — M779 Enthesopathy, unspecified: Secondary | ICD-10-CM | POA: Diagnosis not present

## 2018-09-18 DIAGNOSIS — B351 Tinea unguium: Secondary | ICD-10-CM | POA: Diagnosis not present

## 2018-09-20 NOTE — Progress Notes (Signed)
Subjective:   Patient ID: [name] [name], male   DOB: 85 y.o.   MRN: [id number]   HPI Patient presents stating the big toe is finally better but I am having a lot of pain in the ankle joint and the big toe joint   ROS      Objective:  Physical Exam  Neurovascular status unchanged with exquisite discomfort in the sinus tarsi right and into the first MPJ right with pain with palpation     Assessment:  Inflammatory capsulitis of the sinus tarsi right at first MPJ right capsulitis     Plan:  Oral prep applied to both areas and injected the sinus tarsi right 3 mg Kenalog vomiting Xylocaine and the first MPJ 3 mg Dexasone Kenalog 5 mg Xylocaine and applied padding to the big toe which she will continue to wear

## 2018-09-26 ENCOUNTER — Encounter: Payer: Medicare Other | Admitting: Internal Medicine

## 2018-09-28 DIAGNOSIS — R739 Hyperglycemia, unspecified: Secondary | ICD-10-CM | POA: Diagnosis not present

## 2018-09-28 DIAGNOSIS — J90 Pleural effusion, not elsewhere classified: Secondary | ICD-10-CM | POA: Diagnosis not present

## 2018-09-28 DIAGNOSIS — G629 Polyneuropathy, unspecified: Secondary | ICD-10-CM | POA: Diagnosis not present

## 2018-09-28 DIAGNOSIS — K432 Incisional hernia without obstruction or gangrene: Secondary | ICD-10-CM | POA: Diagnosis not present

## 2018-09-28 DIAGNOSIS — R634 Abnormal weight loss: Secondary | ICD-10-CM | POA: Diagnosis not present

## 2018-09-28 DIAGNOSIS — Z5181 Encounter for therapeutic drug level monitoring: Secondary | ICD-10-CM | POA: Diagnosis not present

## 2018-09-28 DIAGNOSIS — I10 Essential (primary) hypertension: Secondary | ICD-10-CM | POA: Diagnosis not present

## 2018-09-28 DIAGNOSIS — I872 Venous insufficiency (chronic) (peripheral): Secondary | ICD-10-CM | POA: Diagnosis not present

## 2018-09-28 DIAGNOSIS — R600 Localized edema: Secondary | ICD-10-CM | POA: Diagnosis not present

## 2018-09-28 DIAGNOSIS — R2689 Other abnormalities of gait and mobility: Secondary | ICD-10-CM | POA: Diagnosis not present

## 2018-09-28 DIAGNOSIS — M150 Primary generalized (osteo)arthritis: Secondary | ICD-10-CM | POA: Diagnosis not present

## 2018-09-28 DIAGNOSIS — D649 Anemia, unspecified: Secondary | ICD-10-CM | POA: Diagnosis not present

## 2018-10-04 DIAGNOSIS — L570 Actinic keratosis: Secondary | ICD-10-CM | POA: Diagnosis not present

## 2018-10-04 DIAGNOSIS — L819 Disorder of pigmentation, unspecified: Secondary | ICD-10-CM | POA: Diagnosis not present

## 2018-10-11 DIAGNOSIS — G629 Polyneuropathy, unspecified: Secondary | ICD-10-CM | POA: Diagnosis not present

## 2018-10-11 DIAGNOSIS — R2689 Other abnormalities of gait and mobility: Secondary | ICD-10-CM | POA: Diagnosis not present

## 2018-10-11 DIAGNOSIS — J90 Pleural effusion, not elsewhere classified: Secondary | ICD-10-CM | POA: Diagnosis not present

## 2018-10-11 DIAGNOSIS — I872 Venous insufficiency (chronic) (peripheral): Secondary | ICD-10-CM | POA: Diagnosis not present

## 2018-10-11 DIAGNOSIS — R634 Abnormal weight loss: Secondary | ICD-10-CM | POA: Diagnosis not present

## 2018-10-11 DIAGNOSIS — K432 Incisional hernia without obstruction or gangrene: Secondary | ICD-10-CM | POA: Diagnosis not present

## 2018-10-11 DIAGNOSIS — I10 Essential (primary) hypertension: Secondary | ICD-10-CM | POA: Diagnosis not present

## 2018-10-11 DIAGNOSIS — R600 Localized edema: Secondary | ICD-10-CM | POA: Diagnosis not present

## 2018-10-11 DIAGNOSIS — R739 Hyperglycemia, unspecified: Secondary | ICD-10-CM | POA: Diagnosis not present

## 2018-10-11 DIAGNOSIS — Z5181 Encounter for therapeutic drug level monitoring: Secondary | ICD-10-CM | POA: Diagnosis not present

## 2018-10-11 DIAGNOSIS — M150 Primary generalized (osteo)arthritis: Secondary | ICD-10-CM | POA: Diagnosis not present

## 2018-10-11 DIAGNOSIS — D649 Anemia, unspecified: Secondary | ICD-10-CM | POA: Diagnosis not present

## 2018-10-16 ENCOUNTER — Ambulatory Visit (INDEPENDENT_AMBULATORY_CARE_PROVIDER_SITE_OTHER): Payer: Medicare Other

## 2018-10-16 ENCOUNTER — Encounter: Payer: Self-pay | Admitting: Orthopedic Surgery

## 2018-10-16 ENCOUNTER — Ambulatory Visit (INDEPENDENT_AMBULATORY_CARE_PROVIDER_SITE_OTHER): Payer: Medicare Other | Admitting: Orthopedic Surgery

## 2018-10-16 VITALS — Ht 69.0 in | Wt 168.0 lb

## 2018-10-16 DIAGNOSIS — M545 Low back pain, unspecified: Secondary | ICD-10-CM

## 2018-10-16 DIAGNOSIS — G8929 Other chronic pain: Secondary | ICD-10-CM

## 2018-10-16 DIAGNOSIS — M5441 Lumbago with sciatica, right side: Secondary | ICD-10-CM | POA: Diagnosis not present

## 2018-10-16 DIAGNOSIS — M5442 Lumbago with sciatica, left side: Secondary | ICD-10-CM

## 2018-10-16 MED ORDER — PREDNISONE 10 MG PO TABS: 10.0000 mg | ORAL_TABLET | Freq: Every morning | ORAL | 0 refills | Status: DC

## 2018-10-17 ENCOUNTER — Encounter: Payer: Self-pay | Admitting: Orthopedic Surgery

## 2018-10-17 NOTE — Progress Notes (Signed)
Office Visit Note   Patient: [name] [name]           Date of Birth: Nov 04, 1924           MRN: [id number] Visit Date: 10/16/2018              Requested by: [name], MD [place],  [place] [id number] PCP: [name], MD  Chief Complaint  Patient presents with  . Lower Back - Pain      HPI: Patient is a 85 year old gentleman who presents with chronic lower back pain with radiating pain into both hips.  He states he feels like it affects his legs as well he currently ambulates with a walker he has a history of neuropathy without diabetes.  He is currently on 81 mg of aspirin every other day.  Assessment & Plan: Visit Diagnoses:  1. Chronic midline low back pain without sciatica   2. Midline low back pain with bilateral sciatica, unspecified chronicity     Plan: Plan: We will have him continue with the low-dose prednisone 10 mg a day to help with his back symptoms.  He also was taking Percocet and requests no refills for that.  We will set up an MRI scan to see if an epidural steroid injection may be beneficial for his back symptoms.  Follow-up after the MRI scan.  Follow-Up Instructions: No follow-ups on file.   Ortho Exam  Patient is alert, oriented, no adenopathy, well-dressed, normal affect, normal respiratory effort.  examinationPatient has difficulty getting from a sitting to standing position.  He has ecchymosis and bruising on both arms from bleeding into the subcutaneous tissue but there is no open ulcers.  Recommend patient use his medical compression sleeves to protect his arms.  Patient states his back pain is primarily with walking he has a negative straight leg raise bilaterally no focal motor weakness in either lower extremity he has had some relief with the prednisone.  Patient states that several years ago he had epidural steroid injections with Dr. Hardin Negus also with minimal relief.  Imaging: Xr Lumbar Spine 2-3 Views  Result Date:  10/17/2018 2 view radiographs of the lumbar spine shows severe degenerative scoliosis with osteophytic bone spurs calcification of the aorta without aneurysm.  Patient's previous pelvic fracture is healed.  No images are attached to the encounter.  Labs: Lab Results  Component Value Date   CRP 16.2 (H) 10/20/2011   LABURIC 4.7 10/17/2006   REPTSTATUS 03/31/2015 FINAL 03/30/2015   GRAMSTAIN Abundant 12/18/2015   GRAMSTAIN WBC present-predominately PMN 12/18/2015   GRAMSTAIN No Squamous Epithelial Cells Seen 12/18/2015   GRAMSTAIN No Organisms Seen 12/18/2015   CULT 9,000 COLONIES/mL INSIGNIFICANT GROWTH 03/30/2015   LABORGA NORMAL SKIN FLORA 12/18/2015     Lab Results  Component Value Date   ALBUMIN 4.1 05/18/2016   ALBUMIN 3.2 (L) 10/17/2012   ALBUMIN 1.9 (L) 10/24/2011   LABURIC 4.7 10/17/2006    Lab Results  Component Value Date   MG 1.8 08/06/2015   MG 1.7 10/20/2011   No results found for: VD25OH  No results found for: PREALBUMIN CBC EXTENDED Latest Ref Rng & Units 02/10/2018 07/15/2017 05/17/2017  WBC 4.0 - 10.3 K/uL - - 7.4  RBC 4.20 - 5.82 MIL/uL - - 3.35(L)  HGB 13.0 - 17.0 g/dL 10.9(L) 9.9(L) 11.0(L)  HCT 39.0 - 52.0 % 32.0(L) 29.0(L) 33.2(L)  PLT 140 - 400 K/uL - - 148  NEUTROABS 1.5 - 6.5 K/uL - -  5.6  LYMPHSABS 0.9 - 3.3 K/uL - - 0.9     Body mass index is 24.81 kg/m.  Orders:  Orders Placed This Encounter  Procedures  . XR Lumbar Spine 2-3 Views  . MR Lumbar Spine w/o contrast   Meds ordered this encounter  Medications  . predniSONE (DELTASONE) 10 MG tablet    Sig: Take 1 tablet (10 mg total) by mouth every morning.    Dispense:  90 tablet    Refill:  0     Procedures: No procedures performed  Clinical Data: No additional findings.  ROS:  All other systems negative, except as noted in the HPI. Review of Systems  Objective: Vital Signs: Ht 5\' 9"  (1.753 m)   Wt 168 lb (76.2 kg)   BMI 24.81 kg/m   Specialty Comments:  No specialty  comments available.  PMFS History: Patient Active Problem List   Diagnosis Date Noted  . Sacral fracture, closed ([place]) 04/13/2018  . PCO (posterior capsular opacification), bilateral 05/26/2017  . Weakness of right leg 04/20/2017  . 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 Exudative age-related macular degeneration of right eye with active choroidal neovascularization ([place]) 09/30/2016  . Pseudophakia of both eyes 09/30/2016  . Impingement syndrome of right shoulder 06/16/2016  . Impingement syndrome of left shoulder 06/16/2016  . Bilateral leg edema 06/14/2016  . Dizziness and giddiness 06/26/2015  . Abnormality of gait 06/26/2015  . Anxiety 04/14/2015  . GERD (gastroesophageal reflux disease) 04/03/2015  . OSA (obstructive sleep apnea) 04/03/2015  . HLD (hyperlipidemia) 04/03/2015  . SOB (shortness of breath)   . Left elbow fracture 03/29/2015  . Dizziness 01/01/2015  . LBP (low back pain) 01/01/2015  . Malignant neoplasm of prostate ([place]) 01/01/2015  . BP (high blood pressure) 01/01/2015  . DOE (dyspnea on exertion) 10/26/2013  . Essential hypertension, benign 10/26/2013  . Post-traumatic wound infection 10/17/2012  . Cellulitis 10/17/2012  . Atrial fibrillation ([place]) 10/22/2011  . DVT, lower extremity, distal, chronic ([place]) 10/22/2011  . Pacemaker 10/19/2011  . Prostate cancer ([place]) 10/19/2011  . COPD (chronic obstructive pulmonary disease) with emphysema ([place]) 02/04/2011  . Anemia 12/10/2010   Past Medical History:  Diagnosis Date  . Abnormality of gait    due to low back pain , uses  cane and walker  . Arthritis   . Bilateral lower extremity edema    wears TED hose  . Cancer [facility])    prostate- treated with radiation  . Cardiac pacemaker in situ 10/11/2006   medtronic  . Chronic iron deficiency anemia oncologist/ hematoloist-- dr Alen Blew   treatment IV Iron infusions  . Chronic low back pain   . CKD (chronic kidney  disease), stage III ([place])   . COPD with emphysema [facility])    pulmologist-  dr Elsworth Soho  . DDD (degenerative disc disease), lumbosacral   . Degenerative scoliosis   . Dyspnea    with exertion  . Full dentures   . GERD (gastroesophageal reflux disease)   . Gross hematuria   . Headache    history of migraines- "way in the past"  . Hereditary and idiopathic peripheral neuropathy    bilateral lower leg  . Hiatal hernia   . History of DVT of lower extremity 10/19/2011   chronic distal popliteal right lower extremity  . History of external beam radiation therapy 2008   prostate cancer  . History of pancreatitis 2013  . History of pneumothorax 09/2006   iatrogenic--  in setting cardiac pacemaker placement  . History of prostate cancer 2008   s/p  external radiation therapy  . Hypertension   . Lesion of bladder   . Macular degeneration of right eye   . Nocturia   . OSA on CPAP   . PAF (paroxysmal atrial fibrillation) ([place]) 10/2011   1st episode documented 09/ 2013 admission for gallstons/ pancreatitis and prior to cholecystectomy , went back in NSR without interventeion  . Presence of permanent cardiac pacemaker   . Sinus node dysfunction (HCC)    s/p  cardiac pacemaker placement 10-11-2006  . Wears glasses   . Wears hearing aid in both ears     Family History  Problem Relation Age of Onset  . Heart disease Father   . Heart disease Mother   . Colon cancer Mother   . Stroke Mother   . Cancer Sister   . Heart disease Sister   . Heart attack Neg Hx   . Hypertension Neg Hx     Past Surgical History:  Procedure Laterality Date  . BALLOON DILATION N/A 01/14/2015   Procedure: BALLOON DILATION;  Surgeon: Garlan Fair, MD;  Location: [facility];  Service: Endoscopy;  Laterality: N/A;  . BALLOON DILATION N/A 10/12/2017   Procedure: BALLOON DILATION;  Surgeon: Otis Brace, MD;  Location: [facility];  Service: Gastroenterology;  Laterality: N/A;  . BIOPSY  10/12/2017    Procedure: BIOPSY;  Surgeon: Otis Brace, MD;  Location: [facility];  Service: Gastroenterology;;  . CARDIAC CATHETERIZATION  08-29-2000   [place]   no significant obstructive CAD, intact globle LV size and systolic function with regional wall motion abnormalities noted,  ?coronary spasm producing wall motion abnormality, elevated CPK and chest pain (ef 55%)  . CARDIAC PACEMAKER PLACEMENT  10-11-2006    dr Leonia Reeves   W/  ATRIAL LEAD REVISION 10-12-2006   (medtronic)  . CARDIOVASCULAR STRESS TEST  11-18-2011    dr Irish Lack   Low risk nuclear study w/ no ishemia/  normal wall motion,  post-stress ef 48% (LVEF 56% on prior study 2008)  . CARPAL TUNNEL RELEASE Left 11/14/2014   Procedure: CARPAL TUNNEL RELEASE LEFT THUMB;  Surgeon: Daryll Brod, MD;  Location: [facility];  Service: Orthopedics;  Laterality: Left;  ANESTHESIA:  IV REGIONAL FAB  . CATARACT EXTRACTION W/ INTRAOCULAR LENS  IMPLANT, BILATERAL    . CHOLECYSTECTOMY  10/23/2011  . CHOLECYSTECTOMY  10/23/2011   Procedure: CHOLECYSTECTOMY;  Surgeon: Rolm Bookbinder, MD;  Location: [facility];  Service: General;  Laterality: N/A;  with intraoperative cholangiogram  . COLONOSCOPY    . ESOPHAGOGASTRODUODENOSCOPY (EGD) WITH PROPOFOL N/A 01/14/2015   Procedure: ESOPHAGOGASTRODUODENOSCOPY (EGD) WITH PROPOFOL;  Surgeon: Garlan Fair, MD;  [facility];  Service: Endoscopy;  Laterality: N/A;  . ESOPHAGOGASTRODUODENOSCOPY (EGD) WITH PROPOFOL N/A 10/12/2017   Procedure: ESOPHAGOGASTRODUODENOSCOPY (EGD) WITH PROPOFOL;  Surgeon: Otis Brace, MD;  Location: [facility];  Service: Gastroenterology;  Laterality: N/A;  . I&D EXTREMITY Left 10/20/2012   Procedure: LEFT LEG IRRIGATION AND DEBRIDEMENT, AND WOUND VAC APPLICATION;  Surgeon: [place] Payment, MD;  [facility];  Service: Orthopedics;  Laterality: Left;  . I&D EXTREMITY Left 10/22/2012   Procedure: IRRIGATION AND DEBRIDEMENT EXTREMITY;  Surgeon: [place]na Payment,  MD;  [facility];  Service: Orthopedics;  Laterality: Left;  . INGUINAL HERNIA REPAIR Left yrs ago  . LAPAROSCOPIC ASSISTED VENTRAL HERNIA REPAIR  08-08-2009   dr Excell Seltzer   AND OPEN REPAIR RECURRENT LEFT INGUINAL HERNIA  .  LAPAROSCOPIC INGUINAL HERNIA REPAIR Left 07-24-1999    dr Excell Seltzer  . OPEN VENTRAL HERNIA REPAIR /  LYSIS ADHESIONS  09-23-2010    dr Donne Hazel  . ORIF ELBOW FRACTURE Left 03/29/2015   Procedure: LEFT ELBOW OPEN REDUCTION INTERNAL FIXATION (ORIF) DISTAL HUMERUS FRACTURE WITH OLECRANON OSTEOTOMY AND ULNAR NERVE RELEASE;  Surgeon: Roseanne Kaufman, MD;  Location: [facility];  Service: Orthopedics;  Laterality: Left;  . PPM GENERATOR CHANGEOUT N/A 02/10/2018   Procedure: PPM GENERATOR CHANGEOUT;  Surgeon: Deboraha Sprang, MD;  Location: [facility];  Service: Cardiovascular;  Laterality: N/A;  . REPAIR SUPRAUMBILICAL HERNIA   [id number]   dr Excell Seltzer  . SHOULDER ARTHROSCOPY WITH DISTAL CLAVICLE RESECTION Right 11-07-2007   dr Rhona Raider   [place] ACROMIOPLASTY  . SKIN SPLIT GRAFT Left 10/22/2012   Procedure: SKIN GRAFT SPLIT THICKNESS;  Surgeon: [place] Payment, MD;  [facility];  Service: Orthopedics;  Laterality: Left;  . TOTAL KNEE ARTHROPLASTY Right 1990's  . TRANSTHORACIC ECHOCARDIOGRAM  06-12-2012   dr Irish Lack   ef 50-55%/ mild LAE/ mild MR and TR/  AV sclerosis without stenosis/  RVSP 64mmHg  . TRANSURETHRAL RESECTION OF BLADDER TUMOR N/A 07/15/2017   Procedure: CYSTOSCOPY TRANSURETHRAL RESECTION OF BLADDER TUMOR (TURBT);  Surgeon: Kathie Rhodes, MD;  [facility];  Service: Urology;  Laterality: N/A;   Social History   Occupational History  . 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 Smoking status: Former Smoker    Packs/day: 1.50    Years: 35.00    Pack years: 52.50    Types: Cigarettes    Quit date: 02/15/1973    Years since quitting: 45.6  . Smokeless  tobacco: Never Used  Substance and Sexual Activity  . Alcohol use: No  . Drug use: No  . Sexual activity: Never

## 2018-10-26 ENCOUNTER — Telehealth: Payer: Self-pay | Admitting: Orthopedic Surgery

## 2018-10-26 DIAGNOSIS — R634 Abnormal weight loss: Secondary | ICD-10-CM | POA: Diagnosis not present

## 2018-10-26 DIAGNOSIS — R159 Full incontinence of feces: Secondary | ICD-10-CM | POA: Diagnosis not present

## 2018-10-26 NOTE — Telephone Encounter (Signed)
Patient called. He says he needs a CT scan not a MRI. His call back number is [phone]. Thanks

## 2018-10-27 ENCOUNTER — Other Ambulatory Visit: Payer: Self-pay

## 2018-10-27 DIAGNOSIS — G8911 Acute pain due to trauma: Secondary | ICD-10-CM

## 2018-10-27 NOTE — Telephone Encounter (Signed)
Will hold message for Dr Sharol Given to review.

## 2018-10-27 NOTE — Telephone Encounter (Signed)
Cancel last message, patient has been set-up for CT scan by [place].

## 2018-10-30 ENCOUNTER — Other Ambulatory Visit: Payer: Self-pay | Admitting: Gastroenterology

## 2018-10-30 DIAGNOSIS — R2689 Other abnormalities of gait and mobility: Secondary | ICD-10-CM | POA: Diagnosis not present

## 2018-10-30 DIAGNOSIS — R634 Abnormal weight loss: Secondary | ICD-10-CM | POA: Diagnosis not present

## 2018-10-30 DIAGNOSIS — J90 Pleural effusion, not elsewhere classified: Secondary | ICD-10-CM | POA: Diagnosis not present

## 2018-10-30 DIAGNOSIS — D649 Anemia, unspecified: Secondary | ICD-10-CM | POA: Diagnosis not present

## 2018-10-30 DIAGNOSIS — R7989 Other specified abnormal findings of blood chemistry: Secondary | ICD-10-CM | POA: Diagnosis not present

## 2018-10-30 DIAGNOSIS — G629 Polyneuropathy, unspecified: Secondary | ICD-10-CM | POA: Diagnosis not present

## 2018-10-30 DIAGNOSIS — Z5181 Encounter for therapeutic drug level monitoring: Secondary | ICD-10-CM | POA: Diagnosis not present

## 2018-10-30 DIAGNOSIS — R739 Hyperglycemia, unspecified: Secondary | ICD-10-CM | POA: Diagnosis not present

## 2018-10-30 DIAGNOSIS — K432 Incisional hernia without obstruction or gangrene: Secondary | ICD-10-CM | POA: Diagnosis not present

## 2018-10-30 DIAGNOSIS — I10 Essential (primary) hypertension: Secondary | ICD-10-CM | POA: Diagnosis not present

## 2018-10-30 DIAGNOSIS — M150 Primary generalized (osteo)arthritis: Secondary | ICD-10-CM | POA: Diagnosis not present

## 2018-10-30 DIAGNOSIS — I872 Venous insufficiency (chronic) (peripheral): Secondary | ICD-10-CM | POA: Diagnosis not present

## 2018-11-01 ENCOUNTER — Other Ambulatory Visit: Payer: Self-pay

## 2018-11-01 ENCOUNTER — Ambulatory Visit
Admission: RE | Admit: 2018-11-01 | Discharge: 2018-11-01 | Disposition: A | Discharge status: Home / Self Care | Payer: Medicare Other | Source: Ambulatory Visit | Attending: Orthopedic Surgery | Admitting: Orthopedic Surgery

## 2018-11-01 DIAGNOSIS — G8929 Other chronic pain: Secondary | ICD-10-CM

## 2018-11-01 DIAGNOSIS — M48061 Spinal stenosis, lumbar region without neurogenic claudication: Secondary | ICD-10-CM | POA: Diagnosis not present

## 2018-11-01 DIAGNOSIS — M5441 Lumbago with sciatica, right side: Secondary | ICD-10-CM

## 2018-11-01 DIAGNOSIS — M545 Low back pain, unspecified: Secondary | ICD-10-CM

## 2018-11-02 ENCOUNTER — Encounter: Payer: Self-pay | Admitting: Pulmonary Disease

## 2018-11-02 ENCOUNTER — Ambulatory Visit (INDEPENDENT_AMBULATORY_CARE_PROVIDER_SITE_OTHER): Payer: Medicare Other | Admitting: Pulmonary Disease

## 2018-11-02 ENCOUNTER — Other Ambulatory Visit: Payer: Self-pay

## 2018-11-02 VITALS — BP 114/60 | HR 49 | Temp 97.0°F | Ht 69.0 in | Wt 156.8 lb

## 2018-11-02 DIAGNOSIS — J432 Centrilobular emphysema: Secondary | ICD-10-CM | POA: Diagnosis not present

## 2018-11-02 DIAGNOSIS — Z23 Encounter for immunization: Secondary | ICD-10-CM

## 2018-11-02 DIAGNOSIS — G8929 Other chronic pain: Secondary | ICD-10-CM

## 2018-11-02 DIAGNOSIS — G4733 Obstructive sleep apnea (adult) (pediatric): Secondary | ICD-10-CM

## 2018-11-02 NOTE — Patient Instructions (Addendum)
You were seen today by Lauraine Rinne, NP  for:   1. OSA (obstructive sleep apnea)  We recommend that you continue using your CPAP daily >>>Keep up the hard work using your device >>> Goal should be wearing this for the entire night that you are sleeping, at least 4 to 6 hours  Remember:  . Do not drive or operate heavy machinery if tired or drowsy.  . Please notify the supply company and office if you are unable to use your device regularly due to missing supplies or machine being broken.  . Work on maintaining a healthy weight and following your recommended nutrition plan  . Maintain proper daily exercise and movement  . Maintaining proper use of your device can also help improve management of other chronic illnesses such as: Blood pressure, blood sugars, and weight management.   BiPAP/ CPAP Cleaning:  >>>Clean weekly, with Dawn soap, and bottle brush.  Set up to air dry.   2. Centrilobular emphysema (HCC)  Anoro Ellipta  >>> Take 1 puff daily in the morning right when you wake up >>>Rinse your mouth out after use >>>This is a daily maintenance inhaler, NOT a rescue inhaler >>>Contact our office if you are having difficulties affording or obtaining this medication >>>It is important for you to be able to take this daily and not miss any doses     Only use your albuterol as a rescue medication to be used if you can't catch your breath by resting or doing a relaxed purse lip breathing pattern.  - The less you use it, the better it will work when you need it. - Ok to use up to 2 puffs  every 4 hours if you must but call for immediate appointment if use goes up over your usual need - Don't leave home without it !!  (think of it like the spare tire for your car)   Note your daily symptoms > remember "red flags" for COPD:   >>>Increase in cough >>>increase in sputum production >>>increase in shortness of breath or activity  intolerance.   If you notice these symptoms, please call  the office to be seen.    High dose flu vaccine today    Follow Up:    Return in about 1 year (around 11/02/2019), or if symptoms worsen or fail to improve, for Follow up with [name], Follow up with Wyn Quaker FNP-C.   Please do your part to reduce the spread of COVID-19:      Reduce your risk of any infection  and COVID19 by using the similar precautions used for avoiding the common cold or flu:  Marland Kitchen Wash your hands often with soap and warm water for at least 20 seconds.  If soap and water are not readily available, use an alcohol-based hand sanitizer with at least 60% alcohol.  . If coughing or sneezing, cover your mouth and nose by coughing or sneezing into the elbow areas of your shirt or coat, into a tissue or into your sleeve (not your hands). Langley Gauss A MASK when in public  . Avoid shaking hands with others and consider head nods or verbal greetings only. . Avoid touching your eyes, nose, or mouth with unwashed hands.  . Avoid close contact with people who are sick. . Avoid places or events with large numbers of people in one location, like concerts or sporting events. . If you have some symptoms but not all symptoms, continue to monitor at home and  seek medical attention if your symptoms worsen. . If you are having a medical emergency, call 911.   [place] / e-Visit: [email or web address]         [facility]: [facility]: [id number]                   [facility]: [id number]     It is flu season:   >>> Best ways to protect herself from the flu: Receive the yearly flu vaccine, practice good hand hygiene washing with soap and also using hand sanitizer when available, eat a nutritious meals, get adequate rest, hydrate appropriately   Please contact the office if your symptoms worsen or you have concerns that you are  not improving.   Thank you for choosing [facility] for your healthcare, and for allowing [place] to partner with you on your healthcare journey. I am thankful to be able to provide care to you today.   Wyn Quaker FNP-C      Influenza Virus Vaccine injection What is this medicine? INFLUENZA VIRUS VACCINE (in floo EN zuh VAHY ruhs vak SEEN) helps to reduce the risk of getting influenza also known as the flu. The vaccine only helps protect you against some strains of the flu. This medicine may be used for other purposes; ask your health care provider or pharmacist if you have questions. COMMON BRAND NAME(S): Afluria, Afluria Quadrivalent, Agriflu, Alfuria, FLUAD, Fluarix, Fluarix Quadrivalent, Flublok, Flublok Quadrivalent, FLUCELVAX, Flulaval, Fluvirin, Fluzone, Fluzone High-Dose, Fluzone Intradermal What should I tell my health care provider before I take this medicine? They need to know if you have any of these conditions:  bleeding disorder like hemophilia  fever or infection  Guillain-Barre syndrome or other neurological problems  immune system problems  infection with the human immunodeficiency virus (HIV) or AIDS  low blood platelet counts  multiple sclerosis  an unusual or allergic reaction to influenza virus vaccine, latex, other medicines, foods, dyes, or preservatives. Different brands of vaccines contain different allergens. Some may contain latex or eggs. Talk to your doctor about your allergies to make sure that you get the right vaccine.  pregnant or trying to get pregnant  breast-feeding How should I use this medicine? This vaccine is for injection into a muscle or under the skin. It is given by a health care professional. A copy of Vaccine Information Statements will be given before each vaccination. Read this sheet carefully each time. The sheet may change frequently. Talk to your healthcare provider to see which vaccines are right for you. Some vaccines  should not be used in all age groups. Overdosage: If you think you have taken too much of this medicine contact a poison control center or emergency room at once. NOTE: This medicine is only for you. Do not share this medicine with others. What if I miss a dose? This does not apply. What may interact with this medicine?  chemotherapy or radiation therapy  medicines that lower your immune system like etanercept, anakinra, infliximab, and adalimumab  medicines that treat or prevent blood clots like warfarin  phenytoin  steroid medicines like prednisone or cortisone  theophylline  vaccines This list may not describe all possible interactions. Give your health care provider a list of all the medicines, herbs, non-prescription drugs, or dietary supplements you use. Also tell them if you smoke, drink alcohol, or use illegal drugs. Some items may interact with your  medicine. What should I watch for while using this medicine? Report any side effects that do not go away within 3 days to your doctor or health care professional. Call your health care provider if any unusual symptoms occur within 6 weeks of receiving this vaccine. You may still catch the flu, but the illness is not usually as bad. You cannot get the flu from the vaccine. The vaccine will not protect against colds or other illnesses that may cause fever. The vaccine is needed every year. What side effects may I notice from receiving this medicine? Side effects that you should report to your doctor or health care professional as soon as possible:  allergic reactions like skin rash, itching or hives, swelling of the face, lips, or tongue Side effects that usually do not require medical attention (report to your doctor or health care professional if they continue or are bothersome):  fever  headache  muscle aches and pains  pain, tenderness, redness, or swelling at the injection site  tiredness This list may not describe all  possible side effects. Call your doctor for medical advice about side effects. You may report side effects to FDA at 1-800-FDA-1088. Where should I keep my medicine? The vaccine will be given by a health care professional in a clinic, pharmacy, doctor's office, or other health care setting. You will not be given vaccine doses to store at home. NOTE: This sheet is a summary. It may not cover all possible information. If you have questions about this medicine, talk to your doctor, pharmacist, or health care provider.  2020 Elsevier/Gold Standard (2017-12-27 08:45:43)

## 2018-11-02 NOTE — Progress Notes (Signed)
@Patient  ID: [name] [name], male    DOB: [birth year], 85 y.o.   MRN: [id number]  Chief Complaint  Patient presents with   Follow-up    OSA- On CPAP f/u- doing well and averaging 8-10 hours of sleep every night and denies any problems with machine or mask.     Referring provider: [name], MD  HPI:  85 year old male followed in our office for COPD and obstructive sleep apnea currently managed on CPAP  PMH: A. fib, GERD, DVT, hypertension, prostate cancer Smoker/ Smoking History: Former Smoker.  Quit in 1975.  52.5-pack-year smoking history Maintenance: Anoro Ellipta Pt of: [name]  11/02/2018  - Visit   85 year old male former smoker followed in our office for obstructive sleep apnea as well as COPD and emphysema.  Patient presenting today as a follow-up for receiving a new CPAP machine.  Patient has been maintained on CPAP for many years.  Patient believes that the new CPAP is going well.  CPAP compliance report confirms this.  10/03/2018-11/01/2018- 30 had a last 30 days use, all 30 those days greater than 4 hours, average usage 7 hours and 34 minutes, CPAP set pressure of 10, AHI 2.5  Overall patient feels that he is doing well he has no current issues.  He would like a flu vaccine today.  He is maintained on [employer] which she receives from the [place].  Uses rescue inhaler 1 time a week.      Tests:   Arlyce Harman 2015: FEV1 1.92 (63%), ratio 55  FENO:  No results found for: NITRICOXIDE  PFT: No flowsheet data found.  Imaging: Ct Lumbar Spine Wo Contrast  Result Date: 11/01/2018 CLINICAL DATA:  Radiculopathy, persistent symptoms following 6 weeks of conservative therapy. Radiation of both lower extremities. EXAM: CT LUMBAR SPINE WITHOUT CONTRAST TECHNIQUE: Multidetector CT imaging of the lumbar spine was performed without intravenous contrast administration. Multiplanar CT image reconstructions were also generated. COMPARISON:  CT 08/16/2017, radiograph 10/16/2018  FINDINGS: Segmentation: 5 lumbar type vertebral bodies with mild pseudoarticulation of the transverse processes of L5 with the adjacent sacrum. Alignment: Severe levoconvex curvature of the lumbar spine with apex at L2. 2 mm retrolisthesis L1 on L2, 4 mm retrolisthesis L2 on L3. No acute traumatic listhesis. Right lateral listhesis of L3 on L4 of approximately 12 mm. Vertebrae: No acute fracture. No suspicious osseous lesion. Exuberant anterior and lateral osteophyte formation with bridging osteophytes spanning L1 through L3. Paraspinal and other soft tissues: Severe atherosclerotic calcification of the aorta and branch vessels. Partial visualization of the cardiac pacer wires. Moderate hiatal hernia with adjacent reactive free fluid versus venous collaterals, incompletely assessed on this non-contrast CT. Included abdominopelvic contents are otherwise unremarkable. Disc levels: Level by level evaluation of the lumbar spine below: T12-L1: Severe intervertebral disc height loss with increasing vacuum phenomenon mild canal stenosis. Severe right neural foraminal stenosis. Mild left neural foraminal narrowing. L1-L2: Severe disc height loss with partial fusion due to bony osteophyte formation across the left anterolateral disc space. Moderate facet hypertrophic changes. No significant canal stenosis. Mild left foraminal narrowing. L2-L3: Near complete disc height loss with calcified disc and partial fusion across the left anterolateral space by bony bridging osteophytes. Posterior spurring and moderate to severe facet hypertrophic changes. Mild canal stenosis. Mild left and moderate to severe right foraminal narrowing. L3-L4: Near complete disc height loss, vacuum phenomenon, posterior calcified central disc protrusion, moderate facet hypertrophic changes. Posterior ligamentum flavum in folding. Moderate to severe canal stenosis. Mild left  and moderate right foraminal narrowing. L4-L5: Significant disc height loss with  vacuum phenomenon. Posterior spurring and a moderate to severe facet hypertrophic changes. No canal stenosis. Mild left and severe right foraminal narrowing. L5-S1: Complete disc height loss, partial bony fusion across the joint space. Posterior spurring and severe right facet degenerative change. No significant canal stenosis, moderate bilateral foraminal narrowing. IMPRESSION: 1. Severe levoconvex curvature of the lumbar spine with apex at L2. 2. Multilevel discogenic and facet degenerate changes of the lumbar spine as described above. 3. Moderate to severe canal stenosis at L3-L4. Mild canal stenosis at L2-L3. 4. Severe right neural foraminal stenosis at T12-L1. 5. Severe right foraminal narrowing at L4-5. Moderate to severe right foraminal narrowing at L2-3. Moderate bilateral foraminal narrowing at L5-S1. Additional multilevel changes, as above. 6. Moderate hiatal hernia with some adjacent reactive free fluid versus venous collateralization, incompletely assessed on noncontrast CT exam. Correlate with symptomatology and findings from prior EGD. 7. Aortic Atherosclerosis (ICD10-I70.0). Electronically Signed   By: Lovena Le M.D.   On: 11/01/2018 20:38   Xr Lumbar Spine 2-3 Views  Result Date: 10/17/2018 2 view radiographs of the lumbar spine shows severe degenerative scoliosis with osteophytic bone spurs calcification of the aorta without aneurysm.  Patient's previous pelvic fracture is healed.     Specialty Problems      Pulmonary Problems   COPD (chronic obstructive pulmonary disease) with emphysema ([place])    PFT's 2013:  FEV1 2.13 (85%), ratio 52, nl TLC, normal DLCO S[place] 2014:  FEV1 1.63 (55%), ratio 51 No change with LABA/ICS S[place] 2015:  FEV1 1.92 (63%), ratio 55       DOE (dyspnea on exertion)   SOB (shortness of breath)   OSA (obstructive sleep apnea)    [facility]         No Known Allergies  Immunization History  Administered Date(s) Administered   Influenza Split  11/02/2015   Influenza, High Dose Seasonal PF 11/14/2016, 11/07/2017   Influenza,inj,Quad PF,6+ Mos 10/16/2012   Influenza-Unspecified 11/15/2013   Pneumococcal Conjugate-13 06/03/2014   Pneumococcal-Unspecified 05/04/2013   Tdap 10/05/2012   Zoster 02/16/2011   Zoster Recombinat (Shingrix) 04/15/2017, 06/15/2017    Past Medical History:  Diagnosis Date   Abnormality of gait    due to low back pain , uses  cane and walker   Arthritis    Bilateral lower extremity edema    wears TED hose   Cancer ([place])    prostate- treated with radiation   Cardiac pacemaker in situ 10/11/2006   medtronic   Chronic iron deficiency anemia oncologist/ hematoloist-- dr Alen Blew   treatment IV Iron infusions   Chronic low back pain    CKD (chronic kidney disease), stage III ([place])    COPD with emphysema ([place])    pulmologist-  dr Elsworth Soho   DDD (degenerative disc disease), lumbosacral    Degenerative scoliosis    Dyspnea    with exertion   Full dentures    GERD (gastroesophageal reflux disease)    Gross hematuria    Headache    history of migraines- "way in the past"   Hereditary and idiopathic peripheral neuropathy    bilateral lower leg   Hiatal hernia    History of DVT of lower extremity 10/19/2011   chronic distal popliteal right lower extremity   History of external beam radiation therapy 2008   prostate cancer   History of pancreatitis 2013   History of pneumothorax 09/2006   iatrogenic-- in setting cardiac pacemaker  placement   History of prostate cancer 2008   s/p  external radiation therapy   Hypertension    Lesion of bladder    Macular degeneration of right eye    Nocturia    OSA on CPAP    PAF (paroxysmal atrial fibrillation) ([place]) 10/2011   1st episode documented 09/ 2013 admission for gallstons/ pancreatitis and prior to cholecystectomy , went back in NSR without interventeion   Presence of permanent cardiac pacemaker    Sinus node  dysfunction (HCC)    s/p  cardiac pacemaker placement 10-11-2006   Wears glasses    Wears hearing aid in both ears     Tobacco History: Social History   Tobacco Use  Smoking Status Former Smoker   Packs/day: 1.50   Years: 35.00   Pack years: 52.50   Types: Cigarettes   Quit date: 02/15/1973   Years since quitting: 45.7  Smokeless Tobacco Never Used   Counseling given: Not Answered   Continue to not smoke  Outpatient Encounter Medications as of 11/02/2018  Medication Sig   acetaminophen (TYLENOL) 650 MG CR tablet Take 1,300 mg by mouth 2 (two) times daily.    albuterol (PROVENTIL HFA;VENTOLIN HFA) 108 (90 Base) MCG/ACT inhaler Inhale 2 puffs into the lungs every 6 (six) hours as needed for wheezing or shortness of breath.   aspirin EC 81 MG tablet Take 81 mg by mouth daily.    Calcium Carbonate-Vitamin D (CALCIUM-D PO) Take 1 tablet by mouth daily.    denosumab (PROLIA) 60 MG/ML SOSY injection Inject 60 mg into the skin every 6 (six) months.   diphenhydrAMINE (BENADRYL) 25 MG tablet Take 25 mg by mouth daily as needed for allergies.    fluticasone (FLONASE) 50 MCG/ACT nasal spray Place 1 spray into both nostrils daily as needed for allergies or rhinitis.   furosemide (LASIX) 20 MG tablet Take 20-40 mg by mouth See admin instructions. Alternate taking 20 mg and 40 mg daily   lisinopril (PRINIVIL,ZESTRIL) 10 MG tablet Take 10 mg by mouth daily.    mirabegron ER (MYRBETRIQ) 50 MG TB24 tablet Take 50 mg by mouth daily.   Multiple Vitamins-Minerals (PRESERVISION AREDS PO) Take 1 tablet by mouth 2 (two) times daily.   oxyCODONE (OXY IR/ROXICODONE) 5 MG immediate release tablet Take 5 mg by mouth every 6 (six) hours as needed for severe pain.   oxymetazoline (AFRIN) 0.05 % nasal spray Place 1 spray into both nostrils 2 (two) times daily as needed for congestion.   pantoprazole (PROTONIX) 40 MG tablet Take 1 tablet (40 mg total) by mouth daily before breakfast.    predniSONE (DELTASONE) 10 MG tablet Take 1 tablet (10 mg total) by mouth every morning.   Umeclidinium-Vilanterol (ANORO ELLIPTA) 62.5-25 MCG/INH AEPB Inhale 1 puff into the lungs daily.    vitamin B-12 (CYANOCOBALAMIN) 500 MCG tablet Take 500 mcg by mouth every Monday, Wednesday, and Friday.    Wound Dressings (MEDIHONEY WOUND/BURN DRESSING) GEL Apply to affected are 3 times a week, and cover with sterile dressing.   [DISCONTINUED] predniSONE (DELTASONE) 10 MG tablet TAKE TWO TABLETS BY MOUTH EVERY MORNING WITH BREAKFAST   No facility-administered encounter medications on file as of 11/02/2018.      Review of Systems  Review of Systems  Constitutional: Negative for activity change, chills, fatigue, fever and unexpected weight change.  HENT: Negative for postnasal drip, rhinorrhea, sinus pressure, sinus pain and sore throat.   Eyes: Negative.   Respiratory: Negative for cough, shortness of breath  and wheezing.   Cardiovascular: Negative for chest pain and palpitations.  Gastrointestinal: Negative for constipation, diarrhea, nausea and vomiting.  Endocrine: Negative.   Genitourinary: Negative.   Musculoskeletal: Negative.   Skin: Negative.   Neurological: Negative for dizziness and headaches.  Psychiatric/Behavioral: Negative.  Negative for dysphoric mood. The patient is not nervous/anxious.   All other systems reviewed and are negative.    Physical Exam  BP 114/60 (BP Location: Left Arm, Cuff Size: Normal)    Pulse (!) 49    Temp (!) 97 F (36.1 C) (Oral)    Ht 5\' 9"  (1.753 m)    Wt 156 lb 12.8 oz (71.1 kg)    SpO2 99% Comment: on RA   BMI 23.16 kg/m   Wt Readings from Last 5 Encounters:  11/02/18 156 lb 12.8 oz (71.1 kg)  10/16/18 168 lb (76.2 kg)  09/04/18 168 lb (76.2 kg)  07/31/18 168 lb (76.2 kg)  07/12/18 168 lb (76.2 kg)     Physical Exam Vitals signs and nursing note reviewed.  Constitutional:      General: He is not in acute distress.    Appearance: Normal  appearance. He is normal weight.     Comments: Elderly   HENT:     Head: Normocephalic and atraumatic.     Right Ear: Hearing and external ear normal.     Left Ear: Hearing and external ear normal.     Ears:     Comments: Hear aids bilaterally     Nose: No mucosal edema.     Right Turbinates: Not enlarged.     Left Turbinates: Not enlarged.  Eyes:     Pupils: Pupils are equal, round, and reactive to light.  Neck:     Musculoskeletal: Normal range of motion.  Cardiovascular:     Rate and Rhythm: Normal rate and regular rhythm.     Pulses: Normal pulses.     Heart sounds: Normal heart sounds. No murmur.  Pulmonary:     Effort: Pulmonary effort is normal.     Breath sounds: Normal breath sounds. No decreased breath sounds, wheezing or rales.  Abdominal:     General: Bowel sounds are normal. There is no distension.     Palpations: Abdomen is soft.     Tenderness: There is no abdominal tenderness.  Musculoskeletal:     Right lower leg: Edema (trace) present.     Left lower leg: Edema (trace) present.  Lymphadenopathy:     Cervical: No cervical adenopathy.  Skin:    General: Skin is warm and dry.     Capillary Refill: Capillary refill takes less than 2 seconds.     Findings: No erythema or rash.  Neurological:     General: No focal deficit present.     Mental Status: He is alert and oriented to person, place, and time.     Motor: No weakness.     Coordination: Coordination normal.     Gait: Gait is intact. Gait normal.  Psychiatric:        Mood and Affect: Mood normal.        Behavior: Behavior normal. Behavior is cooperative.        Thought Content: Thought content normal.        Judgment: Judgment normal.      Lab Results:  CBC    Component Value Date/Time   WBC 7.4 05/17/2017 1112   RBC 3.35 (L) 05/17/2017 1112   HGB 10.9 (L) 02/10/2018 1322  HGB 10.3 (L) 11/16/2016 1118   HCT 32.0 (L) 02/10/2018 1322   HCT 32.5 (L) 11/16/2016 1118   PLT 148 05/17/2017 1112     PLT 127 (L) 11/16/2016 1118   MCV 99.3 (H) 05/17/2017 1112   MCV 95.6 11/16/2016 1118   MCH 32.8 05/17/2017 1112   MCHC 33.0 05/17/2017 1112   RDW 15.1 (H) 05/17/2017 1112   RDW 13.7 11/16/2016 1118   LYMPHSABS 0.9 05/17/2017 1112   LYMPHSABS 1.4 11/16/2016 1118   MONOABS 0.4 05/17/2017 1112   MONOABS 0.3 11/16/2016 1118   EOSABS 0.3 05/17/2017 1112   EOSABS 0.2 11/16/2016 1118   BASOSABS 0.1 05/17/2017 1112   BASOSABS 0.0 11/16/2016 1118    BMET    Component Value Date/Time   NA 137 02/10/2018 1322   NA 143 05/18/2016 1254   K 4.1 02/10/2018 1322   K 4.1 05/18/2016 1254   CL 105 02/10/2018 1322   CO2 24 05/18/2016 1254   GLUCOSE 88 02/10/2018 1322   GLUCOSE 102 05/18/2016 1254   BUN 25 (H) 02/10/2018 1322   BUN 36.4 (H) 05/18/2016 1254   CREATININE 1.10 02/10/2018 1322   CREATININE 1.4 (H) 05/18/2016 1254   CALCIUM 9.3 05/18/2016 1254   GFRNONAA 59 (L) 03/31/2015 0357   GFRAA >60 03/31/2015 0357    BNP No results found for: BNP  ProBNP    Component Value Date/Time   PROBNP 1,315.0 (H) 10/23/2012 0419      Assessment & Plan:   COPD (chronic obstructive pulmonary disease) with emphysema (HCC) Plan: Continue Anoro Ellipta Continue rescue inhaler as needed for shortness of breath or wheezing Notify our office if you have worsened breathing Follow-up with our office in 1 year High-dose flu vaccine today  OSA (obstructive sleep apnea) Plan: Continue CPAP therapy Follow-up in 1 year    Return in about 1 year (around 11/02/2019), or if symptoms worsen or fail to improve, for Follow up with [name], Follow up with Wyn Quaker FNP-C.   Lauraine Rinne, NP 11/02/2018   This appointment was 18 minutes long with over 50% of the time in direct face-to-face patient care, assessment, plan of care, and follow-up.

## 2018-11-02 NOTE — Assessment & Plan Note (Signed)
Plan: Continue CPAP therapy Follow-up in 1 year 

## 2018-11-02 NOTE — Assessment & Plan Note (Signed)
Plan: Continue Anoro Ellipta Continue rescue inhaler as needed for shortness of breath or wheezing Notify our office if you have worsened breathing Follow-up with our office in 1 year High-dose flu vaccine today

## 2018-11-08 ENCOUNTER — Other Ambulatory Visit: Payer: Medicare Other

## 2018-11-08 ENCOUNTER — Ambulatory Visit
Admission: RE | Admit: 2018-11-08 | Discharge: 2018-11-08 | Disposition: A | Discharge status: Home / Self Care | Payer: Medicare Other | Source: Ambulatory Visit | Attending: Gastroenterology | Admitting: Gastroenterology

## 2018-11-08 DIAGNOSIS — R634 Abnormal weight loss: Secondary | ICD-10-CM | POA: Diagnosis not present

## 2018-11-17 DIAGNOSIS — M150 Primary generalized (osteo)arthritis: Secondary | ICD-10-CM | POA: Diagnosis not present

## 2018-11-17 DIAGNOSIS — I872 Venous insufficiency (chronic) (peripheral): Secondary | ICD-10-CM | POA: Diagnosis not present

## 2018-11-17 DIAGNOSIS — R7989 Other specified abnormal findings of blood chemistry: Secondary | ICD-10-CM | POA: Diagnosis not present

## 2018-11-17 DIAGNOSIS — R2689 Other abnormalities of gait and mobility: Secondary | ICD-10-CM | POA: Diagnosis not present

## 2018-11-17 DIAGNOSIS — I10 Essential (primary) hypertension: Secondary | ICD-10-CM | POA: Diagnosis not present

## 2018-11-17 DIAGNOSIS — R600 Localized edema: Secondary | ICD-10-CM | POA: Diagnosis not present

## 2018-11-17 DIAGNOSIS — K432 Incisional hernia without obstruction or gangrene: Secondary | ICD-10-CM | POA: Diagnosis not present

## 2018-11-17 DIAGNOSIS — Z5181 Encounter for therapeutic drug level monitoring: Secondary | ICD-10-CM | POA: Diagnosis not present

## 2018-11-17 DIAGNOSIS — D649 Anemia, unspecified: Secondary | ICD-10-CM | POA: Diagnosis not present

## 2018-11-17 DIAGNOSIS — J90 Pleural effusion, not elsewhere classified: Secondary | ICD-10-CM | POA: Diagnosis not present

## 2018-11-17 DIAGNOSIS — G629 Polyneuropathy, unspecified: Secondary | ICD-10-CM | POA: Diagnosis not present

## 2018-11-17 DIAGNOSIS — R739 Hyperglycemia, unspecified: Secondary | ICD-10-CM | POA: Diagnosis not present

## 2018-11-20 DIAGNOSIS — M4840XS Fatigue fracture of vertebra, site unspecified, sequela of fracture: Secondary | ICD-10-CM | POA: Diagnosis not present

## 2018-11-20 DIAGNOSIS — Z79891 Long term (current) use of opiate analgesic: Secondary | ICD-10-CM | POA: Diagnosis not present

## 2018-11-20 DIAGNOSIS — M546 Pain in thoracic spine: Secondary | ICD-10-CM | POA: Diagnosis not present

## 2018-11-20 DIAGNOSIS — G894 Chronic pain syndrome: Secondary | ICD-10-CM | POA: Diagnosis not present

## 2018-11-21 ENCOUNTER — Ambulatory Visit (INDEPENDENT_AMBULATORY_CARE_PROVIDER_SITE_OTHER): Payer: Medicare Other | Admitting: Physical Medicine and Rehabilitation

## 2018-11-21 ENCOUNTER — Ambulatory Visit: Payer: Self-pay

## 2018-11-21 ENCOUNTER — Encounter: Payer: Self-pay | Admitting: Physical Medicine and Rehabilitation

## 2018-11-21 VITALS — BP 123/53 | HR 80

## 2018-11-21 DIAGNOSIS — M5416 Radiculopathy, lumbar region: Secondary | ICD-10-CM

## 2018-11-21 DIAGNOSIS — M48062 Spinal stenosis, lumbar region with neurogenic claudication: Secondary | ICD-10-CM

## 2018-11-21 MED ORDER — BETAMETHASONE SOD PHOS & ACET 6 (3-3) MG/ML IJ SUSP
12.0000 mg | Freq: Once | INTRAMUSCULAR | Status: AC
  Administered 2018-11-21: 12 mg

## 2018-11-21 NOTE — Progress Notes (Signed)
 .  Numeric Pain Rating Scale and Functional Assessment Average Pain 7   In the last MONTH (on 0-10 scale) has pain interfered with the following?  1. General activity like being  able to carry out your everyday physical activities such as walking, climbing stairs, carrying groceries, or moving a chair?  Rating(6)   +Driver, -BT, -Dye Allergies.  

## 2018-11-24 ENCOUNTER — Ambulatory Visit (INDEPENDENT_AMBULATORY_CARE_PROVIDER_SITE_OTHER): Payer: Medicare Other | Admitting: *Deleted

## 2018-11-24 DIAGNOSIS — I480 Paroxysmal atrial fibrillation: Secondary | ICD-10-CM

## 2018-11-24 LAB — CUP PACEART REMOTE DEVICE CHECK
Battery Remaining Longevity: 154 mo
Battery Voltage: 3.11 V
Brady Statistic AP VP Percent: 1.35 %
Brady Statistic AP VS Percent: 65.78 %
Brady Statistic AS VP Percent: 0.06 %
Brady Statistic AS VS Percent: 32.81 %
Brady Statistic RA Percent Paced: 70.4 %
Brady Statistic RV Percent Paced: 1.41 %
Date Time Interrogation Session: 20201009004526
Implantable Lead Implant Date: 20080826
Implantable Lead Implant Date: 20080826
Implantable Lead Location: 753859
Implantable Lead Location: 753860
Implantable Lead Model: 5076
Implantable Lead Model: 5076
Implantable Pulse Generator Implant Date: 20191227
Lead Channel Impedance Value: 285 Ohm
Lead Channel Impedance Value: 323 Ohm
Lead Channel Impedance Value: 361 Ohm
Lead Channel Impedance Value: 399 Ohm
Lead Channel Pacing Threshold Amplitude: 0.75 V
Lead Channel Pacing Threshold Amplitude: 1.125 V
Lead Channel Pacing Threshold Pulse Width: 0.4 ms
Lead Channel Pacing Threshold Pulse Width: 0.4 ms
Lead Channel Sensing Intrinsic Amplitude: 1.875 mV
Lead Channel Sensing Intrinsic Amplitude: 1.875 mV
Lead Channel Sensing Intrinsic Amplitude: 2.75 mV
Lead Channel Sensing Intrinsic Amplitude: 2.75 mV
Lead Channel Setting Pacing Amplitude: 1.75 V
Lead Channel Setting Pacing Amplitude: 2.5 V
Lead Channel Setting Pacing Pulse Width: 0.4 ms
Lead Channel Setting Sensing Sensitivity: 1.2 mV

## 2018-11-29 DIAGNOSIS — R200 Anesthesia of skin: Secondary | ICD-10-CM | POA: Diagnosis not present

## 2018-11-29 DIAGNOSIS — R609 Edema, unspecified: Secondary | ICD-10-CM | POA: Diagnosis not present

## 2018-12-06 NOTE — Progress Notes (Signed)
Remote pacemaker transmission.   

## 2018-12-08 ENCOUNTER — Encounter (HOSPITAL_COMMUNITY): Payer: Self-pay

## 2018-12-08 ENCOUNTER — Other Ambulatory Visit: Payer: Self-pay

## 2018-12-08 ENCOUNTER — Ambulatory Visit (HOSPITAL_COMMUNITY)
Admission: EM | Admit: 2018-12-08 | Discharge: 2018-12-08 | Disposition: A | Discharge status: Home / Self Care | Payer: Medicare Other | Attending: Urgent Care | Admitting: Urgent Care

## 2018-12-08 DIAGNOSIS — T148XXA Other injury of unspecified body region, initial encounter: Secondary | ICD-10-CM

## 2018-12-08 DIAGNOSIS — L03115 Cellulitis of right lower limb: Secondary | ICD-10-CM | POA: Diagnosis not present

## 2018-12-08 DIAGNOSIS — L089 Local infection of the skin and subcutaneous tissue, unspecified: Secondary | ICD-10-CM

## 2018-12-08 HISTORY — DX: Pulmonary hypertension, unspecified: I27.20

## 2018-12-08 HISTORY — DX: Nonrheumatic mitral (valve) insufficiency: I34.0

## 2018-12-08 MED ORDER — CEPHALEXIN 500 MG PO CAPS: 500.0000 mg | ORAL_CAPSULE | Freq: Three times a day (TID) | ORAL | 0 refills | Status: DC

## 2018-12-08 NOTE — ED Triage Notes (Signed)
Pt presents with a wound on right leg that he states is not healing well; pt states about a week ago his sons dog accidentally scratched him trying to jump on the bed.

## 2018-12-08 NOTE — ED Provider Notes (Signed)
MRN: [id number] DOB: August 24, 1924  Subjective:   [name] [name] is a 85 y.o. male presenting for 5 to 6-day history of worsening right lower leg wound.  Symptoms started after an accidental scratch from his dog.  Has tried to do wound care at home with the help of his wife, doing dressing changes and using peroxide.  Denies fever, chest pain, nausea, vomiting, belly pain.   Current Facility-Administered Medications:  .  betamethasone acetate-betamethasone sodium phosphate (CELESTONE) injection 12 mg, 12 mg, Other, Once, Magnus Sinning, MD  Current Outpatient Medications:  .  acetaminophen (TYLENOL) 650 MG CR tablet, Take 1,300 mg by mouth 2 (two) times daily. , Disp: , Rfl:  .  albuterol (PROVENTIL HFA;VENTOLIN HFA) 108 (90 Base) MCG/ACT inhaler, Inhale 2 puffs into the lungs every 6 (six) hours as needed for wheezing or shortness of breath., Disp: 1 Inhaler, Rfl: 6 .  aspirin EC 81 MG tablet, Take 81 mg by mouth daily. , Disp: , Rfl:  .  Calcium Carbonate-Vitamin D (CALCIUM-D PO), Take 1 tablet by mouth daily. , Disp: , Rfl:  .  denosumab (PROLIA) 60 MG/ML SOSY injection, Inject 60 mg into the skin every 6 (six) months., Disp: , Rfl:  .  diphenhydrAMINE (BENADRYL) 25 MG tablet, Take 25 mg by mouth daily as needed for allergies. , Disp: , Rfl:  .  fluticasone (FLONASE) 50 MCG/ACT nasal spray, Place 1 spray into both nostrils daily as needed for allergies or rhinitis., Disp: , Rfl:  .  furosemide (LASIX) 20 MG tablet, Take 20-40 mg by mouth See admin instructions. Alternate taking 20 mg and 40 mg daily, Disp: , Rfl:  .  lisinopril (PRINIVIL,ZESTRIL) 10 MG tablet, Take 10 mg by mouth daily. , Disp: , Rfl:  .  mirabegron ER (MYRBETRIQ) 50 MG TB24 tablet, Take 50 mg by mouth daily., Disp: , Rfl:  .  Multiple Vitamins-Minerals (PRESERVISION AREDS PO), Take 1 tablet by mouth 2 (two) times daily., Disp: , Rfl:  .  oxyCODONE (OXY IR/ROXICODONE) 5 MG immediate release tablet, Take 5 mg by mouth every  6 (six) hours as needed for severe pain., Disp: , Rfl:  .  oxymetazoline (AFRIN) 0.05 % nasal spray, Place 1 spray into both nostrils 2 (two) times daily as needed for congestion., Disp: , Rfl:  .  pantoprazole (PROTONIX) 40 MG tablet, Take 1 tablet (40 mg total) by mouth daily before breakfast., Disp: 90 tablet, Rfl: 1 .  predniSONE (DELTASONE) 10 MG tablet, Take 1 tablet (10 mg total) by mouth every morning., Disp: 90 tablet, Rfl: 0 .  Umeclidinium-Vilanterol (ANORO ELLIPTA) 62.5-25 MCG/INH AEPB, Inhale 1 puff into the lungs daily. , Disp: , Rfl:  .  vitamin B-12 (CYANOCOBALAMIN) 500 MCG tablet, Take 500 mcg by mouth every Monday, Wednesday, and Friday. , Disp: , Rfl:  .  Wound Dressings (MEDIHONEY WOUND/BURN DRESSING) GEL, Apply to affected are 3 times a week, and cover with sterile dressing., Disp: 1 Tube, Rfl: 5   No Known Allergies  Past Medical History:  Diagnosis Date  . Abnormality of gait    due to low back pain , uses  cane and walker  . Arthritis   . Bilateral lower extremity edema    wears TED hose  . Cancer [facility])    prostate- treated with radiation  . Cardiac pacemaker in situ 10/11/2006   medtronic  . Chronic iron deficiency anemia oncologist/ hematoloist-- dr Alen Blew   treatment IV Iron infusions  . Chronic low back pain   .  CKD (chronic kidney disease), stage III   . COPD with emphysema [facility])    pulmologist-  dr Elsworth Soho  . DDD (degenerative disc disease), lumbosacral   . Degenerative scoliosis   . Dyspnea    with exertion  . Full dentures   . GERD (gastroesophageal reflux disease)   . Gross hematuria   . Headache    history of migraines- "way in the past"  . Hereditary and idiopathic peripheral neuropathy    bilateral lower leg  . Hiatal hernia   . History of DVT of lower extremity 10/19/2011   chronic distal popliteal right lower extremity  . History of external beam radiation therapy 2008   prostate cancer  . History of pancreatitis 2013  . History of  pneumothorax 09/2006   iatrogenic-- in setting cardiac pacemaker placement  . History of prostate cancer 2008   s/p  external radiation therapy  . Hypertension   . Lesion of bladder   . Macular degeneration of right eye   . Nocturia   . OSA on CPAP   . PAF (paroxysmal atrial fibrillation) ([place]) 10/2011   1st episode documented 09/ 2013 admission for gallstons/ pancreatitis and prior to cholecystectomy , went back in NSR without interventeion  . Presence of permanent cardiac pacemaker   . Sinus node dysfunction (HCC)    s/p  cardiac pacemaker placement 10-11-2006  . Wears glasses   . Wears hearing aid in both ears      Past Surgical History:  Procedure Laterality Date  . BALLOON DILATION N/A 01/14/2015   Procedure: BALLOON DILATION;  Surgeon: Garlan Fair, MD;  Location: [facility];  Service: Endoscopy;  Laterality: N/A;  . BALLOON DILATION N/A 10/12/2017   Procedure: BALLOON DILATION;  Surgeon: Otis Brace, MD;  Location: [place] [facility];  Service: Gastroenterology;  Laterality: N/A;  . BIOPSY  10/12/2017   Procedure: BIOPSY;  Surgeon: Otis Brace, MD;  Location: [place] [facility];  Service: Gastroenterology;;  . CARDIAC CATHETERIZATION  08-29-2000   [place]   no significant obstructive CAD, intact globle LV size and systolic function with regional wall motion abnormalities noted,  ?coronary spasm producing wall motion abnormality, elevated CPK and chest pain (ef 55%)  . CARDIAC PACEMAKER PLACEMENT  10-11-2006    dr Leonia Reeves   W/  ATRIAL LEAD REVISION 10-12-2006   (medtronic)  . CARDIOVASCULAR STRESS TEST  11-18-2011    dr Irish Lack   Low risk nuclear study w/ no ishemia/  normal wall motion,  post-stress ef 48% (LVEF 56% on prior study 2008)  . CARPAL TUNNEL RELEASE Left 11/14/2014   Procedure: CARPAL TUNNEL RELEASE LEFT THUMB;  Surgeon: Daryll Brod, MD;  Location: [place];  Service: Orthopedics;  Laterality: Left;  ANESTHESIA:  IV REGIONAL FAB  . CATARACT  EXTRACTION W/ INTRAOCULAR LENS  IMPLANT, BILATERAL    . CHOLECYSTECTOMY  10/23/2011  . CHOLECYSTECTOMY  10/23/2011   Procedure: CHOLECYSTECTOMY;  Surgeon: Rolm Bookbinder, MD;  Location: [place];  Service: General;  Laterality: N/A;  with intraoperative cholangiogram  . COLONOSCOPY    . ESOPHAGOGASTRODUODENOSCOPY (EGD) WITH PROPOFOL N/A 01/14/2015   Procedure: ESOPHAGOGASTRODUODENOSCOPY (EGD) WITH PROPOFOL;  Surgeon: Garlan Fair, MD;  Location: [facility];  Service: Endoscopy;  Laterality: N/A;  . ESOPHAGOGASTRODUODENOSCOPY (EGD) WITH PROPOFOL N/A 10/12/2017   Procedure: ESOPHAGOGASTRODUODENOSCOPY (EGD) WITH PROPOFOL;  Surgeon: Otis Brace, MD;  Location: [place];  Service: Gastroenterology;  Laterality: N/A;  . I&D EXTREMITY Left 10/20/2012   Procedure: LEFT LEG IRRIGATION AND DEBRIDEMENT, AND WOUND  VAC APPLICATION;  Surgeon: Marianna Payment, MD;  Location: [facility];  Service: Orthopedics;  Laterality: Left;  . I&D EXTREMITY Left 10/22/2012   Procedure: IRRIGATION AND DEBRIDEMENT EXTREMITY;  Surgeon: Marianna Payment, MD;  Location: [facility];  Service: Orthopedics;  Laterality: Left;  . INGUINAL HERNIA REPAIR Left yrs ago  . LAPAROSCOPIC ASSISTED VENTRAL HERNIA REPAIR  08-08-2009   dr Excell Seltzer   AND OPEN REPAIR RECURRENT LEFT INGUINAL HERNIA  . LAPAROSCOPIC INGUINAL HERNIA REPAIR Left 07-24-1999    dr Excell Seltzer  . OPEN VENTRAL HERNIA REPAIR /  LYSIS ADHESIONS  09-23-2010    dr Donne Hazel  . ORIF ELBOW FRACTURE Left 03/29/2015   Procedure: LEFT ELBOW OPEN REDUCTION INTERNAL FIXATION (ORIF) DISTAL HUMERUS FRACTURE WITH OLECRANON OSTEOTOMY AND ULNAR NERVE RELEASE;  Surgeon: Roseanne Kaufman, MD;  Location: [place];  Service: Orthopedics;  Laterality: Left;  . PPM GENERATOR CHANGEOUT N/A 02/10/2018   Procedure: PPM GENERATOR CHANGEOUT;  Surgeon: Deboraha Sprang, MD;  Location: [facility];  Service: Cardiovascular;  Laterality: N/A;  . REPAIR SUPRAUMBILICAL HERNIA   [id number]   dr  Excell Seltzer  . SHOULDER ARTHROSCOPY WITH DISTAL CLAVICLE RESECTION Right 11-07-2007   dr Rhona Raider   [place] ACROMIOPLASTY  . SKIN SPLIT GRAFT Left 10/22/2012   Procedure: SKIN GRAFT SPLIT THICKNESS;  Surgeon: Marianna Payment, MD;  Location: [facility];  Service: Orthopedics;  Laterality: Left;  . TOTAL KNEE ARTHROPLASTY Right 1990's  . TRANSTHORACIC ECHOCARDIOGRAM  06-12-2012   dr Irish Lack   ef 50-55%/ mild LAE/ mild MR and TR/  AV sclerosis without stenosis/  RVSP 35mmHg  . TRANSURETHRAL RESECTION OF BLADDER TUMOR N/A 07/15/2017   Procedure: CYSTOSCOPY TRANSURETHRAL RESECTION OF BLADDER TUMOR (TURBT);  Surgeon: Kathie Rhodes, MD;  Location[facility];  Service: Urology;  Laterality: N/A;    ROS  Objective:   Vitals: BP 114/67 (BP Location: Left Arm)   Pulse 83   Temp 97.6 F (36.4 C) (Oral)   Resp 16   SpO2 100%   Physical Exam Constitutional:      General: He is not in acute distress.    Appearance: Normal appearance. He is well-developed and normal weight. He is not ill-appearing, toxic-appearing or diaphoretic.  HENT:     Head: Normocephalic and atraumatic.     Right Ear: External ear normal.     Left Ear: External ear normal.     Nose: Nose normal.     Mouth/Throat:     Mouth: Mucous membranes are moist.     Pharynx: Oropharynx is clear.  Eyes:     General: No scleral icterus.    Extraocular Movements: Extraocular movements intact.     Pupils: Pupils are equal, round, and reactive to light.  Cardiovascular:     Rate and Rhythm: Normal rate and regular rhythm.     Heart sounds: Normal heart sounds. No murmur. No friction rub. No gallop.   Pulmonary:     Effort: Pulmonary effort is normal. No respiratory distress.     Breath sounds: Normal breath sounds. No stridor. No wheezing, rhonchi or rales.  Skin:    General: Skin is warm and dry.     Findings: Erythema present.       Neurological:     Mental Status: He is alert and oriented to  person, place, and time.  Psychiatric:        Mood and Affect: Mood normal.  Behavior: Behavior normal.        Thought Content: Thought content normal.        Judgment: Judgment normal.         Assessment and Plan :   1. Cellulitis of leg, right   2. Wound infection     Wound care reviewed with patient.  We will have him start Keflex to address cellulitis of infected wound.  He will have close follow-up, return to clinic on 12/10/2018 for recheck. Counseled patient on potential for adverse effects with medications prescribed/recommended today, ER and return-to-clinic precautions discussed, patient verbalized understanding.    Jaynee Eagles, PA-C 12/08/18 [id number]

## 2018-12-08 NOTE — Discharge Instructions (Addendum)
Please change your dressing 2-3 times daily. Do not apply any ointments or creams. Each time you change your dressing, make sure you clean gently around the perimeter of the wound with gentle soap and warm water. Pat your wound dry and let it air out if possible for 1-2 hours before reapplying another dressing.

## 2018-12-08 NOTE — Progress Notes (Addendum)
Cardiology Office Note    Date:  12/11/2018   ID:  LADD GLAWSON, DOB Aug 27, 1924, MRN [id number]  PCP:  Lawerance Cruel, MD  Cardiologist:  Larae Grooms, MD  Electrophysiologist:  Virl Axe, MD   Chief Complaint: yearly f/u of PAF  History of Present Illness:   [name] [name] ([id number]) is a 85 y.o. male with history of PAF, HTN, mild mitral regurgitation, mild pulmonary HTN, anemia, arthritis, COPD, GERD, OSA with history of CPAP use, prostate CA, chronic leg pain and neuropathy, chronic iron deficiency anemia, CKD stage III, sinus node dysfunction s/p Medtr[employer] PPM (last changeout 01/2018, followed by EP), PVCs, remote DVT 2013 (not seen on 2014 scan), hematuria who presents for yearly follow-up.  He is followed by Dr. Caryl Comes and Dr. Irish Lack. Per history he has declined anticoagulation for his atrial fib which Dr. Irish Lack felt was reasonable. He'd also had history of hematuria as well. He had a prior echo in 10/2011 that showed EF 40-45% with hypokinesis to akinesis of the basal-mid inferior myocardium. However, follow-up echo in 2014 showed EF 50-55% without mention of wall motion abnormality, mild MR, mild asymmetric LVH, sclerotic AV but opens well, mildly elevated RVSP. He also has a 6% PVC burden by device in 2017, suspected to be higher but felt low BP was culprit of dizziness at that time so ACEI was reduced. Last device check in 11/2018 did not comment on specific PVC burden, did note VHR episodes were atrially driven. Dr. Caryl Comes did not recommend any changes at that time. Last labs by [facility] 09/2018 showed A1C 5.4, albumin 4.0, normal AST/ALT, BUN 34, Cr 1.23, Hgb 11.5, K 4.7, 2019 TSH wnl. Lipids are followed by primary care every 3 years at this point per his report.  He is seen back for routine follow-up and is doing well from a cardiac standpoint without any chest pain, palpitations, dizziness, edema, orthopnea, shortness of breath or syncope. EKG shows frequent  PVCs today but he is unaware of this. He recently sustained a dog scratch from a family pet and has been followed by urgent care for right leg wound cellulitis. His mobility is limited due to chronic back issues requiring him to use a walker, but he states "I don't just sit around. I try to stay active." He has had intermittent leg pain in his anterior shins for several months now, occurs sporadically both at rest and with exertion although does not happen every time with exertion. He also reports his feet frequently feel cold and he requests to be tested for PAD.   Past Medical History:  Diagnosis Date  . Abnormality of gait    due to low back pain , uses  cane and walker  . Arthritis   . Bilateral lower extremity edema    wears TED hose  . Cancer [facility])    prostate- treated with radiation  . Cardiac pacemaker in situ 10/11/2006   medtronic  . Chronic iron deficiency anemia oncologist/ hematoloist-- dr Alen Blew   treatment IV Iron infusions  . Chronic low back pain   . CKD (chronic kidney disease), stage III   . COPD with emphysema [facility])    pulmologist-  dr Elsworth Soho  . DDD (degenerative disc disease), lumbosacral   . Degenerative scoliosis   . Dyspnea    with exertion  . Full dentures   . GERD (gastroesophageal reflux disease)   . Gross hematuria   . Headache    history of migraines- "way in the  past"  . Hereditary and idiopathic peripheral neuropathy    bilateral lower leg  . Hiatal hernia   . History of DVT of lower extremity 10/19/2011   chronic distal popliteal right lower extremity  . History of external beam radiation therapy 2008   prostate cancer  . History of pancreatitis 2013  . History of pneumothorax 09/2006   iatrogenic-- in setting cardiac pacemaker placement  . History of prostate cancer 2008   s/p  external radiation therapy  . Hypertension   . Lesion of bladder   . Macular degeneration of right eye   . Mild mitral regurgitation   . Mild pulmonary hypertension  ([place])   . Nocturia   . OSA on CPAP   . PAF (paroxysmal atrial fibrillation) ([place]) 10/2011   1st episode documented 09/ 2013 admission for gallstons/ pancreatitis and prior to cholecystectomy , went back in NSR without interventeion  . Presence of permanent cardiac pacemaker   . Sinus node dysfunction (H[facility])    s/p  cardiac pacemaker placement 10-11-2006  . Wears glasses   . Wears hearing aid in both ears     Past Surgical History:  Procedure Laterality Date  . BALLOON DILATION N/A 01/14/2015   Procedure: BALLOON DILATION;  Surgeon: Garlan Fair, MD;  Location: [facility];  Service: Endoscopy;  Laterality: N/A;  . BALLOON DILATION N/A 10/12/2017   Procedure: BALLOON DILATION;  Surgeon: Otis Brace, MD;  Location: [place] [facility];  Service: Gastroenterology;  Laterality: N/A;  . BIOPSY  10/12/2017   Procedure: BIOPSY;  Surgeon: Otis Brace, MD;  Location: [place] [facility];  Service: Gastroenterology;;  . CARDIAC CATHETERIZATION  08-29-2000   [place]   no significant obstructive CAD, intact globle LV size and systolic function with regional wall motion abnormalities noted,  ?coronary spasm producing wall motion abnormality, elevated CPK and chest pain (ef 55%)  . CARDIAC PACEMAKER PLACEMENT  10-11-2006    dr Leonia Reeves   W/  ATRIAL LEAD REVISION 10-12-2006   (medtronic)  . CARDIOVASCULAR STRESS TEST  11-18-2011    dr Irish Lack   Low risk nuclear study w/ no ishemia/  normal wall motion,  post-stress ef 48% (LVEF 56% on prior study 2008)  . CARPAL TUNNEL RELEASE Left 11/14/2014   Procedure: CARPAL TUNNEL RELEASE LEFT THUMB;  Surgeon: Daryll Brod, MD;  Location: [place];  Service: Orthopedics;  Laterality: Left;  ANESTHESIA:  IV REGIONAL FAB  . CATARACT EXTRACTION W/ INTRAOCULAR LENS  IMPLANT, BILATERAL    . CHOLECYSTECTOMY  10/23/2011  . CHOLECYSTECTOMY  10/23/2011   Procedure: CHOLECYSTECTOMY;  Surgeon: Rolm Bookbinder, MD;  Location: [place];  Service: General;  Laterality:  N/A;  with intraoperative cholangiogram  . COLONOSCOPY    . ESOPHAGOGASTRODUODENOSCOPY (EGD) WITH PROPOFOL N/A 01/14/2015   Procedure: ESOPHAGOGASTRODUODENOSCOPY (EGD) WITH PROPOFOL;  Surgeon: Garlan Fair, MD;  Location: [facility];  Service: Endoscopy;  Laterality: N/A;  . ESOPHAGOGASTRODUODENOSCOPY (EGD) WITH PROPOFOL N/A 10/12/2017   Procedure: ESOPHAGOGASTRODUODENOSCOPY (EGD) WITH PROPOFOL;  Surgeon: Otis Brace, MD;  Location: [place];  Service: Gastroenterology;  Laterality: N/A;  . I&D EXTREMITY Left 10/20/2012   Procedure: LEFT LEG IRRIGATION AND DEBRIDEMENT, AND WOUND VAC APPLICATION;  Surgeon: Marianna Payment, MD;  Location: [facility];  Service: Orthopedics;  Laterality: Left;  . I&D EXTREMITY Left 10/22/2012   Procedure: IRRIGATION AND DEBRIDEMENT EXTREMITY;  Surgeon: Marianna Payment, MD;  Location: [facility];  Service: Orthopedics;  Laterality: Left;  . INGUINAL HERNIA REPAIR Left yrs ago  . LAPAROSCOPIC  ASSISTED VENTRAL HERNIA REPAIR  08-08-2009   dr Excell Seltzer   AND OPEN REPAIR RECURRENT LEFT INGUINAL HERNIA  . LAPAROSCOPIC INGUINAL HERNIA REPAIR Left 07-24-1999    dr Excell Seltzer  . OPEN VENTRAL HERNIA REPAIR /  LYSIS ADHESIONS  09-23-2010    dr Donne Hazel  . ORIF ELBOW FRACTURE Left 03/29/2015   Procedure: LEFT ELBOW OPEN REDUCTION INTERNAL FIXATION (ORIF) DISTAL HUMERUS FRACTURE WITH OLECRANON OSTEOTOMY AND ULNAR NERVE RELEASE;  Surgeon: Roseanne Kaufman, MD;  Location: [place];  Service: Orthopedics;  Laterality: Left;  . PPM GENERATOR CHANGEOUT N/A 02/10/2018   Procedure: PPM GENERATOR CHANGEOUT;  Surgeon: Deboraha Sprang, MD;  Location: [place] [facility];  Service: Cardiovascular;  Laterality: N/A;  . REPAIR SUPRAUMBILICAL HERNIA   [id number]   dr Excell Seltzer  . SHOULDER ARTHROSCOPY WITH DISTAL CLAVICLE RESECTION Right 11-07-2007   dr Rhona Raider   [place] ACROMIOPLASTY  . SKIN SPLIT GRAFT Left 10/22/2012   Procedure: SKIN GRAFT SPLIT THICKNESS;  Surgeon:  Marianna Payment, MD;  Location: [facility];  Service: Orthopedics;  Laterality: Left;  . TOTAL KNEE ARTHROPLASTY Right 1990's  . TRANSTHORACIC ECHOCARDIOGRAM  06-12-2012   dr Irish Lack   ef 50-55%/ mild LAE/ mild MR and TR/  AV sclerosis without stenosis/  RVSP 24mmHg  . TRANSURETHRAL RESECTION OF BLADDER TUMOR N/A 07/15/2017   Procedure: CYSTOSCOPY TRANSURETHRAL RESECTION OF BLADDER TUMOR (TURBT);  Surgeon: Kathie Rhodes, MD;  Location: [facility];  Service: Urology;  Laterality: N/A;    Current Medications: Current Meds  Medication Sig  . acetaminophen (TYLENOL) 650 MG CR tablet Take 650 mg by mouth 2 (two) times daily.  Marland Kitchen albuterol (PROVENTIL HFA;VENTOLIN HFA) 108 (90 Base) MCG/ACT inhaler Inhale 2 puffs into the lungs every 6 (six) hours as needed for wheezing or shortness of breath.  Marland Kitchen aspirin 81 MG chewable tablet Chew 81 mg by mouth every other day.  . Calcium Carbonate-Vitamin D (CALCIUM-D PO) Take 1 tablet by mouth daily.   . cephALEXin (KEFLEX) 500 MG capsule Take 1 capsule (500 mg total) by mouth 3 (three) times daily.  . diphenhydrAMINE (BENADRYL) 25 MG tablet Take 25 mg by mouth daily as needed for allergies.   . fluticasone (FLONASE) 50 MCG/ACT nasal spray Place 1 spray into both nostrils daily as needed for allergies or rhinitis.  . furosemide (LASIX) 20 MG tablet Take 20-40 mg by mouth See admin instructions. Alternate taking 20 mg and 40 mg daily  . lisinopril (PRINIVIL,ZESTRIL) 10 MG tablet Take 10 mg by mouth daily.   . mirabegron ER (MYRBETRIQ) 50 MG TB24 tablet Take 50 mg by mouth daily.  . Multiple Vitamins-Minerals (PRESERVISION AREDS PO) Take 1 tablet by mouth 2 (two) times daily.  Marland Kitchen oxyCODONE (OXY IR/ROXICODONE) 5 MG immediate release tablet Take 5 mg by mouth every 6 (six) hours as needed for severe pain.  Marland Kitchen oxymetazoline (AFRIN) 0.05 % nasal spray Place 1 spray into both nostrils 2 (two) times daily as needed for congestion.  . predniSONE (DELTASONE) 10  MG tablet Take 1 tablet (10 mg total) by mouth every morning.  Marland Kitchen Umeclidinium-Vilanterol (ANORO ELLIPTA) 62.5-25 MCG/INH AEPB Inhale 1 puff into the lungs daily.   . vitamin B-12 (CYANOCOBALAMIN) 500 MCG tablet Take 500 mcg by mouth every Monday, Wednesday, and Friday.   . WOUND DRESSINGS EX Apply topically. Apply gel to affected area twice a day, cover with sterile dressing  . [DISCONTINUED] acetaminophen (TYLENOL) 650 MG CR tablet Take 1,300  mg by mouth 2 (two) times daily.   . [DISCONTINUED] aspirin EC 81 MG tablet Take 81 mg by mouth daily.    Current Facility-Administered Medications for the 12/11/18 encounter (Office Visit) with Charlie Pitter, PA-C  Medication  . betamethasone acetate-betamethasone sodium phosphate (CELESTONE) injection 12 mg     Allergies:   Patient has no known allergies.   Social History   Socioeconomic History  . Marital status: Married    Spouse name: martha  . Number of children: 3  . Years of education: 2  . Highest education level: 12th grade  Occupational History  . 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 Financial resource strain: Not on file  . Food insecurity    Worry: Not on file    Inability: Not on file  . Transportation needs    Medical: Not on file    Non-medical: Not on file  Tobacco Use  . Smoking status: Former Smoker    Packs/day: 1.50    Years: 35.00    Pack years: 52.50    Types: Cigarettes    Quit date: 02/15/1973    Years since quitting: 45.8  . Smokeless tobacco: Never Used  Substance and Sexual Activity  . Alcohol use: No  . Drug use: No  . Sexual activity: Never  Lifestyle  . Physical activity    Days per week: Not on file    Minutes per session: Not on file  . Stress: Not on file  Relationships  . Social Herbalist on phone: Not on file    Gets together: Not on file    Attends religious service: Not on file    Active member of club or organization:  Not on file    Attends meetings of clubs or organizations: Not on file    Relationship status: Not on file  Other Topics Concern  . Not on file  Social History Narrative   Lives at home w/ his wife, [name]   Right-handed   Drinks about 1 soda per day, decaf coffee     Family History:  The patient's family history includes Cancer in his sister; Colon cancer in his mother; Heart disease in his father, mother, and sister; Stroke in his mother. There is no history of Heart attack or Hypertension.  ROS:   Please see the history of present illness.  All other systems are reviewed and otherwise negative.    EKGs/Labs/Other Studies Reviewed:    Studies reviewed were summarized above.   EKG:  EKG is ordered today, personally reviewed, demonstrating NSR 82bpm (atrially paced) with frequent PVCs and occasional PACs, nonspecific TW changes, QTc 434ms    Recent Labs: 02/10/2018: BUN 25; Creatinine, Ser 1.10; Hemoglobin 10.9; Potassium 4.1; Sodium 137  Recent Lipid Panel No results found for: CHOL, TRIG, HDL, CHOLHDL, VLDL, LDLCALC, LDLDIRECT  PHYSICAL EXAM:    VS:  BP 140/60   Pulse 68   Ht 5\' 9"  (1.753 m)   Wt 158 lb 9.6 oz (71.9 kg)   SpO2 99%   BMI 23.42 kg/m   BMI: Body mass index is 23.42 kg/m.  GEN: Well nourished, well developed frail but lively WM, in no acute distress HEENT: normocephalic, atraumatic Neck: no JVD, carotid bruits, or masses Cardiac: RRR, occasional ectopy; no murmurs, rubs, or gallops, no edema, diminished PP bilaterally.  Respiratory:  clear to auscultation bilaterally, normal work of breathing GI: soft, nontender,  nondistended, + BS MS: generalized atrophy noted. RLE wound is wrapped (being managed by urgent care) Skin: warm and dry, no rash Neuro:  Alert and Oriented x 3, Strength and sensation are intact, follows commands Psych: euthymic mood, full affect  Wt Readings from Last 3 Encounters:  12/11/18 158 lb 9.6 oz (71.9 kg)  11/02/18 156 lb 12.8  oz (71.1 kg)  10/16/18 168 lb (76.2 kg)     ASSESSMENT & PLAN:   1. Paroxysmal atrial fib - he is in NSR today. His burden is followed by PPM. He has declined anticoagulation which was a joint decision between himself and Dr. Irish Lack. At this point he reports Dr. Caryl Comes told him to take ASA every other day so we will update records. BP is upper limits of normal but given advanced age and h/o dizziness, would not aggressively uptitrate meds. 2. Mild mitral regurgitation - no significant murmur or symptoms to suggest progression. Given advanced age and comorbidities would follow conservatively for now, reserving updated study only for development of symptoms. 3. PVCs - check BMET, Mg, TSH, CBC today. He is not on beta blocker. I will plan to review with Dr. Caryl Comes once lytes are back. 4. Mild pulmonary hypertension along with cardiomyopathy on remote echocardiograms - clinically stable. Continue surveillance for development of new symptoms. 5. Sinus node dysfunction s/p PPM - followed by EP. 6. Bilateral leg pain - pedal pulses are weak to palpation but extremities are warm today and no evidence of acute ischemic compromise. Will check electrolytes as above and also arrange LE vascular testing with [facility]. We told him we would probably schedule this within a few weeks to allow time for his cellulitis to heal. He has not had any other necrotic type wounds in the past.  Disposition: F/u with Dr. Irish Lack in 1 year. Also has ongoing f/u with Dr. Belva Chimes as well.  Medication Adjustments/Labs and Tests Ordered: Current medicines are reviewed at length with the patient today.  Concerns regarding medicines are outlined above. Medication changes, Labs and Tests ordered today are summarized above and listed in the Patient Instructions accessible in Encounters.   Signed, Charlie Pitter, PA-C  12/11/2018 10:59 AM    [facility] [address], [place], [place]  [id number] Phone: [phone]; Fax: [phone]

## 2018-12-10 ENCOUNTER — Encounter (HOSPITAL_COMMUNITY): Payer: Self-pay | Admitting: Emergency Medicine

## 2018-12-10 ENCOUNTER — Ambulatory Visit (HOSPITAL_COMMUNITY)
Admission: EM | Admit: 2018-12-10 | Discharge: 2018-12-10 | Disposition: A | Discharge status: Home / Self Care | Payer: Medicare Other

## 2018-12-10 DIAGNOSIS — L03116 Cellulitis of left lower limb: Secondary | ICD-10-CM

## 2018-12-10 DIAGNOSIS — L089 Local infection of the skin and subcutaneous tissue, unspecified: Secondary | ICD-10-CM

## 2018-12-10 DIAGNOSIS — L03115 Cellulitis of right lower limb: Secondary | ICD-10-CM

## 2018-12-10 DIAGNOSIS — T148XXA Other injury of unspecified body region, initial encounter: Secondary | ICD-10-CM

## 2018-12-10 NOTE — Discharge Instructions (Signed)
Please change your dressing 2-3 times daily. Do not apply any ointments or creams. Each time you change your dressing, make sure you clean gently around the perimeter of the wound with gentle soap and warm water. Pat your wound dry and let it air out if possible for 1-2 hours before reapplying another dressing. Prop your legs up when you can.

## 2018-12-10 NOTE — ED Provider Notes (Signed)
MRN: [id number] DOB: [birth year]  Subjective:   [name] [name] is a 85 y.o. male presenting for recheck on his right lower leg wound.  Symptoms started after an accidental scratch from his dog.  Has tried to do wound care at home with the help of his wife, doing dressing changes and using peroxide.  Was started on Keflex at his last OV on 12/08/2018 and advised to recheck in 2 days. Denies fever, chest pain, nausea, vomiting, belly pain.  Has been performing wound care dressings as discussed previously.   Current Facility-Administered Medications:  .  betamethasone acetate-betamethasone sodium phosphate (CELESTONE) injection 12 mg, 12 mg, Other, Once, Magnus Sinning, MD  Current Outpatient Medications:  .  acetaminophen (TYLENOL) 650 MG CR tablet, Take 1,300 mg by mouth 2 (two) times daily. , Disp: , Rfl:  .  albuterol (PROVENTIL HFA;VENTOLIN HFA) 108 (90 Base) MCG/ACT inhaler, Inhale 2 puffs into the lungs every 6 (six) hours as needed for wheezing or shortness of breath., Disp: 1 Inhaler, Rfl: 6 .  aspirin EC 81 MG tablet, Take 81 mg by mouth daily. , Disp: , Rfl:  .  Calcium Carbonate-Vitamin D (CALCIUM-D PO), Take 1 tablet by mouth daily. , Disp: , Rfl:  .  cephALEXin (KEFLEX) 500 MG capsule, Take 1 capsule (500 mg total) by mouth 3 (three) times daily., Disp: 30 capsule, Rfl: 0 .  denosumab (PROLIA) 60 MG/ML SOSY injection, Inject 60 mg into the skin every 6 (six) months., Disp: , Rfl:  .  diphenhydrAMINE (BENADRYL) 25 MG tablet, Take 25 mg by mouth daily as needed for allergies. , Disp: , Rfl:  .  fluticasone (FLONASE) 50 MCG/ACT nasal spray, Place 1 spray into both nostrils daily as needed for allergies or rhinitis., Disp: , Rfl:  .  furosemide (LASIX) 20 MG tablet, Take 20-40 mg by mouth See admin instructions. Alternate taking 20 mg and 40 mg daily, Disp: , Rfl:  .  lisinopril (PRINIVIL,ZESTRIL) 10 MG tablet, Take 10 mg by mouth daily. , Disp: , Rfl:  .  mirabegron ER (MYRBETRIQ) 50  MG TB24 tablet, Take 50 mg by mouth daily., Disp: , Rfl:  .  Multiple Vitamins-Minerals (PRESERVISION AREDS PO), Take 1 tablet by mouth 2 (two) times daily., Disp: , Rfl:  .  oxyCODONE (OXY IR/ROXICODONE) 5 MG immediate release tablet, Take 5 mg by mouth every 6 (six) hours as needed for severe pain., Disp: , Rfl:  .  oxymetazoline (AFRIN) 0.05 % nasal spray, Place 1 spray into both nostrils 2 (two) times daily as needed for congestion., Disp: , Rfl:  .  pantoprazole (PROTONIX) 40 MG tablet, Take 1 tablet (40 mg total) by mouth daily before breakfast., Disp: 90 tablet, Rfl: 1 .  predniSONE (DELTASONE) 10 MG tablet, Take 1 tablet (10 mg total) by mouth every morning., Disp: 90 tablet, Rfl: 0 .  Umeclidinium-Vilanterol (ANORO ELLIPTA) 62.5-25 MCG/INH AEPB, Inhale 1 puff into the lungs daily. , Disp: , Rfl:  .  vitamin B-12 (CYANOCOBALAMIN) 500 MCG tablet, Take 500 mcg by mouth every Monday, Wednesday, and Friday. , Disp: , Rfl:  .  Wound Dressings (MEDIHONEY WOUND/BURN DRESSING) GEL, Apply to affected are 3 times a week, and cover with sterile dressing., Disp: 1 Tube, Rfl: 5    No Known Allergies   Past Medical History:  Diagnosis Date  . Abnormality of gait    due to low back pain , uses  cane and walker  . Arthritis   . Bilateral lower extremity  edema    wears TED hose  . Cancer [facility])    prostate- treated with radiation  . Cardiac pacemaker in situ 10/11/2006   medtronic  . Chronic iron deficiency anemia oncologist/ hematoloist-- dr Alen Blew   treatment IV Iron infusions  . Chronic low back pain   . CKD (chronic kidney disease), stage III   . COPD with emphysema [facility])    pulmologist-  dr Elsworth Soho  . DDD (degenerative disc disease), lumbosacral   . Degenerative scoliosis   . Dyspnea    with exertion  . Full dentures   . GERD (gastroesophageal reflux disease)   . Gross hematuria   . Headache    history of migraines- "way in the past"  . Hereditary and idiopathic peripheral neuropathy     bilateral lower leg  . Hiatal hernia   . History of DVT of lower extremity 10/19/2011   chronic distal popliteal right lower extremity  . History of external beam radiation therapy 2008   prostate cancer  . History of pancreatitis 2013  . History of pneumothorax 09/2006   iatrogenic-- in setting cardiac pacemaker placement  . History of prostate cancer 2008   s/p  external radiation therapy  . Hypertension   . Lesion of bladder   . Macular degeneration of right eye   . Mild mitral regurgitation   . Mild pulmonary hypertension ([place])   . Nocturia   . OSA on CPAP   . PAF (paroxysmal atrial fibrillation) ([place]) 10/2011   1st episode documented 09/ 2013 admission for gallstons/ pancreatitis and prior to cholecystectomy , went back in NSR without interventeion  . Presence of permanent cardiac pacemaker   . Sinus node dysfunction ([facility])    s/p  cardiac pacemaker placement 10-11-2006  . Wears glasses   . Wears hearing aid in both ears      Past Surgical History:  Procedure Laterality Date  . BALLOON DILATION N/A 01/14/2015   Procedure: BALLOON DILATION;  Surgeon: Garlan Fair, MD;  Location: [facility];  Service: Endoscopy;  Laterality: N/A;  . BALLOON DILATION N/A 10/12/2017   Procedure: BALLOON DILATION;  Surgeon: Otis Brace, MD;  Location: [place] [facility];  Service: Gastroenterology;  Laterality: N/A;  . BIOPSY  10/12/2017   Procedure: BIOPSY;  Surgeon: Otis Brace, MD;  Location: [place] [facility];  Service: Gastroenterology;;  . CARDIAC CATHETERIZATION  08-29-2000   [place]   no significant obstructive CAD, intact globle LV size and systolic function with regional wall motion abnormalities noted,  ?coronary spasm producing wall motion abnormality, elevated CPK and chest pain (ef 55%)  . CARDIAC PACEMAKER PLACEMENT  10-11-2006    dr Leonia Reeves   W/  ATRIAL LEAD REVISION 10-12-2006   (medtronic)  . CARDIOVASCULAR STRESS TEST  11-18-2011    dr Irish Lack   Low risk nuclear study  w/ no ishemia/  normal wall motion,  post-stress ef 48% (LVEF 56% on prior study 2008)  . CARPAL TUNNEL RELEASE Left 11/14/2014   Procedure: CARPAL TUNNEL RELEASE LEFT THUMB;  Surgeon: Daryll Brod, MD;  Location: [place];  Service: Orthopedics;  Laterality: Left;  ANESTHESIA:  IV REGIONAL FAB  . CATARACT EXTRACTION W/ INTRAOCULAR LENS  IMPLANT, BILATERAL    . CHOLECYSTECTOMY  10/23/2011  . CHOLECYSTECTOMY  10/23/2011   Procedure: CHOLECYSTECTOMY;  Surgeon: Rolm Bookbinder, MD;  Location: [place];  Service: General;  Laterality: N/A;  with intraoperative cholangiogram  . COLONOSCOPY    . ESOPHAGOGASTRODUODENOSCOPY (EGD) WITH PROPOFOL N/A 01/14/2015   Procedure:  ESOPHAGOGASTRODUODENOSCOPY (EGD) WITH PROPOFOL;  Surgeon: Garlan Fair, MD;  Location: [facility];  Service: Endoscopy;  Laterality: N/A;  . ESOPHAGOGASTRODUODENOSCOPY (EGD) WITH PROPOFOL N/A 10/12/2017   Procedure: ESOPHAGOGASTRODUODENOSCOPY (EGD) WITH PROPOFOL;  Surgeon: Otis Brace, MD;  Location: [place];  Service: Gastroenterology;  Laterality: N/A;  . I&D EXTREMITY Left 10/20/2012   Procedure: LEFT LEG IRRIGATION AND DEBRIDEMENT, AND WOUND VAC APPLICATION;  Surgeon: Marianna Payment, MD;  Location: [facility];  Service: Orthopedics;  Laterality: Left;  . I&D EXTREMITY Left 10/22/2012   Procedure: IRRIGATION AND DEBRIDEMENT EXTREMITY;  Surgeon: Marianna Payment, MD;  Location: [facility];  Service: Orthopedics;  Laterality: Left;  . INGUINAL HERNIA REPAIR Left yrs ago  . LAPAROSCOPIC ASSISTED VENTRAL HERNIA REPAIR  08-08-2009   dr Excell Seltzer   AND OPEN REPAIR RECURRENT LEFT INGUINAL HERNIA  . LAPAROSCOPIC INGUINAL HERNIA REPAIR Left 07-24-1999    dr Excell Seltzer  . OPEN VENTRAL HERNIA REPAIR /  LYSIS ADHESIONS  09-23-2010    dr Donne Hazel  . ORIF ELBOW FRACTURE Left 03/29/2015   Procedure: LEFT ELBOW OPEN REDUCTION INTERNAL FIXATION (ORIF) DISTAL HUMERUS FRACTURE WITH OLECRANON OSTEOTOMY AND ULNAR NERVE RELEASE;  Surgeon:  Roseanne Kaufman, MD;  Location: [place];  Service: Orthopedics;  Laterality: Left;  . PPM GENERATOR CHANGEOUT N/A 02/10/2018   Procedure: PPM GENERATOR CHANGEOUT;  Surgeon: Deboraha Sprang, MD;  Location: [place] [facility];  Service: Cardiovascular;  Laterality: N/A;  . REPAIR SUPRAUMBILICAL HERNIA   [id number]   dr Excell Seltzer  . SHOULDER ARTHROSCOPY WITH DISTAL CLAVICLE RESECTION Right 11-07-2007   dr Rhona Raider   [place] [facility]  . SKIN SPLIT GRAFT Left 10/22/2012   Procedure: SKIN GRAFT SPLIT THICKNESS;  Surgeon: Marianna Payment, MD;  Location: [facility];  Service: Orthopedics;  Laterality: Left;  . TOTAL KNEE ARTHROPLASTY Right 1990's  . TRANSTHORACIC ECHOCARDIOGRAM  06-12-2012   dr Irish Lack   ef 50-55%/ mild LAE/ mild MR and TR/  AV sclerosis without stenosis/  RVSP 69mmHg  . TRANSURETHRAL RESECTION OF BLADDER TUMOR N/A 07/15/2017   Procedure: CYSTOSCOPY TRANSURETHRAL RESECTION OF BLADDER TUMOR (TURBT);  Surgeon: Kathie Rhodes, MD;  Location: [facility];  Service: Urology;  Laterality: N/A;    ROS   Objective:   Vitals: BP (!) 157/65 (BP Location: Left Arm)   Pulse 85   Temp 97.9 F (36.6 C)   Resp 18   SpO2 100%   Physical Exam Constitutional:      General: He is not in acute distress.    Appearance: Normal appearance. He is well-developed and normal weight. He is not ill-appearing, toxic-appearing or diaphoretic.  HENT:     Head: Normocephalic and atraumatic.     Right Ear: External ear normal.     Left Ear: External ear normal.     Nose: Nose normal.     Mouth/Throat:     Mouth: Mucous membranes are moist.     Pharynx: Oropharynx is clear.  Eyes:     General: No scleral icterus.    Extraocular Movements: Extraocular movements intact.     Pupils: Pupils are equal, round, and reactive to light.  Cardiovascular:     Rate and Rhythm: Normal rate and regular rhythm.     Heart sounds: Normal heart sounds. No murmur. No friction rub. No  gallop.   Pulmonary:     Effort: Pulmonary effort is normal. No respiratory distress.     Breath sounds: Normal breath sounds. No  stridor. No wheezing, rhonchi or rales.  Skin:    General: Skin is warm and dry.     Findings: Erythema present.  Neurological:     Mental Status: He is alert and oriented to person, place, and time.  Psychiatric:        Mood and Affect: Mood normal.        Behavior: Behavior normal.        Thought Content: Thought content normal.        Judgment: Judgment normal.        Photo on 12/10/2018.    Assessment and Plan :   1. Cellulitis of leg, left   2. Wound infection     Significantly improved, maintain antibiotic course, maintain wound care as discussed previously.  Patient is to recheck 1 more time once antibiotic course is completed. Counseled patient on potential for adverse effects with medications prescribed/recommended today, ER and return-to-clinic precautions discussed, patient verbalized understanding.    Jaynee Eagles, PA-C 12/10/18 1709

## 2018-12-10 NOTE — ED Triage Notes (Signed)
Patient is in department for a recheck of right lower leg wound

## 2018-12-11 ENCOUNTER — Other Ambulatory Visit: Payer: Self-pay

## 2018-12-11 ENCOUNTER — Ambulatory Visit (INDEPENDENT_AMBULATORY_CARE_PROVIDER_SITE_OTHER): Payer: Medicare Other | Admitting: Physician Assistant

## 2018-12-11 ENCOUNTER — Encounter: Payer: Self-pay | Admitting: Physician Assistant

## 2018-12-11 VITALS — BP 140/60 | HR 68 | Ht 69.0 in | Wt 158.6 lb

## 2018-12-11 DIAGNOSIS — I480 Paroxysmal atrial fibrillation: Secondary | ICD-10-CM

## 2018-12-11 DIAGNOSIS — M79605 Pain in left leg: Secondary | ICD-10-CM | POA: Diagnosis not present

## 2018-12-11 DIAGNOSIS — I340 Nonrheumatic mitral (valve) insufficiency: Secondary | ICD-10-CM | POA: Diagnosis not present

## 2018-12-11 DIAGNOSIS — I272 Pulmonary hypertension, unspecified: Secondary | ICD-10-CM | POA: Diagnosis not present

## 2018-12-11 DIAGNOSIS — Z950 Presence of cardiac pacemaker: Secondary | ICD-10-CM | POA: Diagnosis not present

## 2018-12-11 DIAGNOSIS — I493 Ventricular premature depolarization: Secondary | ICD-10-CM | POA: Diagnosis not present

## 2018-12-11 DIAGNOSIS — I429 Cardiomyopathy, unspecified: Secondary | ICD-10-CM

## 2018-12-11 DIAGNOSIS — M79604 Pain in right leg: Secondary | ICD-10-CM | POA: Diagnosis not present

## 2018-12-11 LAB — CBC
Hematocrit: 30.8 % — ABNORMAL LOW (ref 37.5–51.0)
Hemoglobin: 10 g/dL — ABNORMAL LOW (ref 13.0–17.7)
MCH: 32.9 pg (ref 26.6–33.0)
MCHC: 32.5 g/dL (ref 31.5–35.7)
MCV: 101 fL — ABNORMAL HIGH (ref 79–97)
Platelets: 160 10*3/uL (ref 150–450)
RBC: 3.04 x10E6/uL — ABNORMAL LOW (ref 4.14–5.80)
RDW: 12.1 % (ref 11.6–15.4)
WBC: 5.6 10*3/uL (ref 3.4–10.8)

## 2018-12-11 LAB — BASIC METABOLIC PANEL
BUN/Creatinine Ratio: 21 (ref 10–24)
BUN: 31 mg/dL (ref 10–36)
CO2: 23 mmol/L (ref 20–29)
Calcium: 9.5 mg/dL (ref 8.6–10.2)
Chloride: 105 mmol/L (ref 96–106)
Creatinine, Ser: 1.49 mg/dL — ABNORMAL HIGH (ref 0.76–1.27)
GFR calc Af Amer: 46 mL/min/{1.73_m2} — ABNORMAL LOW (ref >59)
GFR calc non Af Amer: 40 mL/min/{1.73_m2} — ABNORMAL LOW (ref >59)
Glucose: 87 mg/dL (ref 65–99)
Potassium: 4.4 mmol/L (ref 3.5–5.2)
Sodium: 142 mmol/L (ref 134–144)

## 2018-12-11 LAB — TSH: TSH: 1.09 u[IU]/mL (ref 0.450–4.500)

## 2018-12-11 LAB — MAGNESIUM: Magnesium: 1.8 mg/dL (ref 1.6–2.3)

## 2018-12-11 NOTE — Patient Instructions (Signed)
Medication Instructions:  Your physician recommends that you continue on your current medications as directed. Please refer to the Current Medication list given to you today.  *If you need a refill on your cardiac medications before your next appointment, please call your pharmacy*  Lab Work: TODAY:  BMET, MAG, CBC, & TSH  If you have labs (blood work) drawn today and your tests are completely normal, you will receive your results only by: Marland Kitchen MyChart Message (if you have MyChart) OR . A paper copy in the mail If you have any lab test that is abnormal or we need to change your treatment, we will call you to review the results.  Testing/Procedures: Your physician has requested that you have a lower extremity arterial exercise duplex. During this test, exercise and ultrasound are used to evaluate arterial blood flow in the legs. Allow one hour for this exam. There are no restrictions or special instructions.   Follow-Up: At [facility], you and your health needs are our priority.  As part of our continuing mission to provide you with exceptional heart care, we have created designated Provider Care Teams.  These Care Teams include your primary Cardiologist (physician) and Advanced Practice Providers (APPs -  Physician Assistants and Nurse Practitioners) who all work together to provide you with the care you need, when you need it.  Your next appointment:   12 months  The format for your next appointment:   In Person  Provider:   You may see Larae Grooms, MD or one of the following Advanced Practice Providers on your designated Care Team:    Melina Copa, PA-C  Ermalinda Barrios, PA-C   Other Instructions

## 2018-12-13 ENCOUNTER — Ambulatory Visit: Payer: Medicare Other | Admitting: Family

## 2018-12-18 ENCOUNTER — Other Ambulatory Visit: Payer: Self-pay

## 2018-12-18 ENCOUNTER — Encounter: Payer: Self-pay | Admitting: Podiatry

## 2018-12-18 ENCOUNTER — Ambulatory Visit (INDEPENDENT_AMBULATORY_CARE_PROVIDER_SITE_OTHER): Payer: Medicare Other | Admitting: Podiatry

## 2018-12-18 DIAGNOSIS — M779 Enthesopathy, unspecified: Secondary | ICD-10-CM

## 2018-12-19 ENCOUNTER — Ambulatory Visit: Payer: Medicare Other | Admitting: Orthopedic Surgery

## 2018-12-19 ENCOUNTER — Telehealth: Payer: Self-pay | Admitting: *Deleted

## 2018-12-19 NOTE — Telephone Encounter (Signed)
2nd attempt to reach pt re: lab results, no answer / no machine.

## 2018-12-20 NOTE — Progress Notes (Signed)
Subjective:   Patient ID: [name] [name], male   DOB: 85 y.o.   MRN: [id number]   HPI Patient presents stating I am having a lot of pain in my ankle of both feet and my nails are thick and I cannot cut them myself and they become painful   ROS      Objective:  Physical Exam  Neurovascular status intact with inflammation of the ankle bilateral and thick yellow brittle nailbeds 1-5 both feet with patient's left     Assessment:  Inflammatory capsulitis of the sinus tarsi bilateral along with mycotic infected toenails 1-5 both feet     Plan:  H&P reviewed condition and sterile prep and injected the ankle 3 mg Kenalog 5 mg Xylocaine and debrided nailbeds 1-5 both feet with no iatrogenic bleeding

## 2018-12-21 ENCOUNTER — Encounter: Payer: Self-pay | Admitting: Orthopedic Surgery

## 2018-12-21 ENCOUNTER — Ambulatory Visit (INDEPENDENT_AMBULATORY_CARE_PROVIDER_SITE_OTHER): Payer: Medicare Other | Admitting: Orthopedic Surgery

## 2018-12-21 ENCOUNTER — Other Ambulatory Visit: Payer: Self-pay

## 2018-12-21 VITALS — Ht 69.0 in | Wt 158.0 lb

## 2018-12-21 DIAGNOSIS — M7541 Impingement syndrome of right shoulder: Secondary | ICD-10-CM

## 2018-12-21 NOTE — Telephone Encounter (Signed)
Pt has been made aware of his lab results.  

## 2018-12-22 ENCOUNTER — Encounter: Payer: Self-pay | Admitting: Orthopedic Surgery

## 2018-12-22 DIAGNOSIS — M7541 Impingement syndrome of right shoulder: Secondary | ICD-10-CM

## 2018-12-22 MED ORDER — LIDOCAINE HCL 1 % IJ SOLN
5.0000 mL | INTRAMUSCULAR | Status: AC | PRN
  Administered 2018-12-22: 5 mL

## 2018-12-22 MED ORDER — METHYLPREDNISOLONE ACETATE 40 MG/ML IJ SUSP
40.0000 mg | INTRAMUSCULAR | Status: AC | PRN
  Administered 2018-12-22: 17:00:00 40 mg via INTRA_ARTICULAR

## 2018-12-22 NOTE — Progress Notes (Signed)
Office Visit Note   Patient: [name] [name]           Date of Birth: October 10, 1924           MRN: [id number] Visit Date: 12/21/2018              Requested by: Lawerance Cruel, [place],  [place] [address] PCP: Lawerance Cruel, MD  Chief Complaint  Patient presents with  . Right Shoulder - Pain      HPI: Patient is a 85 year old gentleman who presents with recurrent impingement symptoms right shoulder.  Patient has pain with activities of daily living pain with overhead activities.  Assessment & Plan: Visit Diagnoses:  1. Impingement syndrome of right shoulder     Plan: Shoulder was injected he tolerated this well he will follow-up as needed.  Follow-Up Instructions: Return if symptoms worsen or fail to improve.   Ortho Exam  Patient is alert, oriented, no adenopathy, well-dressed, normal affect, normal respiratory effort. Examination patient has difficulty getting from a sitting to a standing position is using a rolling walker.  Patient has pain with Neer and Hawkins impingement test pain with a drop arm test right shoulder  Imaging: No results found. No images are attached to the encounter.  Labs: Lab Results  Component Value Date   CRP 16.2 (H) 10/20/2011   LABURIC 4.7 10/17/2006   REPTSTATUS 03/31/2015 FINAL 03/30/2015   GRAMSTAIN Abundant 12/18/2015   GRAMSTAIN WBC present-predominately PMN 12/18/2015   GRAMSTAIN No Squamous Epithelial Cells Seen 12/18/2015   GRAMSTAIN No Organisms Seen 12/18/2015   CULT 9,000 COLONIES/mL INSIGNIFICANT GROWTH 03/30/2015   LABORGA NORMAL SKIN FLORA 12/18/2015     Lab Results  Component Value Date   ALBUMIN 4.1 05/18/2016   ALBUMIN 3.2 (L) 10/17/2012   ALBUMIN 1.9 (L) 10/24/2011   LABURIC 4.7 10/17/2006    Lab Results  Component Value Date   MG 1.8 12/11/2018   MG 1.8 08/06/2015   MG 1.7 10/20/2011   No results found for: VD25OH  No results found for: PREALBUMIN CBC EXTENDED Latest Ref  Rng & Units 12/11/2018 02/10/2018 07/15/2017  WBC 3.4 - 10.8 x10E3/uL 5.6 - -  RBC 4.14 - 5.80 x10E6/uL 3.04(L) - -  HGB 13.0 - 17.7 g/dL 10.0(L) 10.9(L) 9.9(L)  HCT 37.5 - 51.0 % 30.8(L) 32.0(L) 29.0(L)  PLT 150 - 450 x10E3/uL 160 - -  NEUTROABS 1.5 - 6.5 K/uL - - -  LYMPHSABS 0.9 - 3.3 K/uL - - -     Body mass index is 23.33 kg/m.  Orders:  No orders of the defined types were placed in this encounter.  No orders of the defined types were placed in this encounter.    Procedures: Large Joint Inj: R subacromial bursa on 12/22/2018 5:01 PM Indications: diagnostic evaluation and pain Details: 22 G 1.5 in needle, posterior approach  Arthrogram: No  Medications: 5 mL lidocaine 1 %; 40 mg methylPREDNISolone acetate 40 MG/ML Outcome: tolerated well, no immediate complications Procedure, treatment alternatives, risks and benefits explained, specific risks discussed. Consent was given by the patient. Immediately prior to procedure a time out was called to verify the correct patient, procedure, equipment, support staff and site/side marked as required. Patient was prepped and draped in the usual sterile fashion.      Clinical Data: No additional findings.  ROS:  All other systems negative, except as noted in the HPI. Review of Systems  Objective: Vital Signs: Ht 5\' 9"  (1.753  m)   Wt 158 lb (71.7 kg)   BMI 23.33 kg/m   Specialty Comments:  No specialty comments available.  PMFS History: Patient Active Problem List   Diagnosis Date Noted  . Sacral fracture, closed ([place]) 04/13/2018  . PCO (posterior capsular opacification), bilateral 05/26/2017  . Weakness of right leg 04/20/2017  . 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 Exudative age-related macular degeneration of right eye with active choroidal neovascularization ([place]) 09/30/2016  . Pseudophakia of both eyes 09/30/2016  . Impingement syndrome of right shoulder 06/16/2016  .  Impingement syndrome of left shoulder 06/16/2016  . Bilateral leg edema 06/14/2016  . Dizziness and giddiness 06/26/2015  . Abnormality of gait 06/26/2015  . Anxiety 04/14/2015  . GERD (gastroesophageal reflux disease) 04/03/2015  . OSA (obstructive sleep apnea) 04/03/2015  . HLD (hyperlipidemia) 04/03/2015  . SOB (shortness of breath)   . Left elbow fracture 03/29/2015  . Dizziness 01/01/2015  . LBP (low back pain) 01/01/2015  . Malignant neoplasm of prostate ([place]) 01/01/2015  . BP (high blood pressure) 01/01/2015  . DOE (dyspnea on exertion) 10/26/2013  . Essential hypertension, benign 10/26/2013  . Post-traumatic wound infection 10/17/2012  . Cellulitis 10/17/2012  . Atrial fibrillation ([place]) 10/22/2011  . DVT, lower extremity, distal, chronic ([place]) 10/22/2011  . Pacemaker 10/19/2011  . Prostate cancer ([place]) 10/19/2011  . COPD (chronic obstructive pulmonary disease) with emphysema ([place]) 02/04/2011  . Anemia 12/10/2010   Past Medical History:  Diagnosis Date  . Abnormality of gait    due to low back pain , uses  cane and walker  . Arthritis   . Bilateral lower extremity edema    wears TED hose  . Cancer [facility])    prostate- treated with radiation  . Cardiac pacemaker in situ 10/11/2006   medtronic  . Chronic iron deficiency anemia oncologist/ hematoloist-- dr Alen Blew   treatment IV Iron infusions  . Chronic low back pain   . CKD (chronic kidney disease), stage III   . COPD with emphysema [facility])    pulmologist-  dr Elsworth Soho  . DDD (degenerative disc disease), lumbosacral   . Degenerative scoliosis   . Dyspnea    with exertion  . Full dentures   . GERD (gastroesophageal reflux disease)   . Gross hematuria   . Headache    history of migraines- "way in the past"  . Hereditary and idiopathic peripheral neuropathy    bilateral lower leg  . Hiatal hernia   . History of DVT of lower extremity 10/19/2011   chronic distal popliteal right lower extremity  . History of external  beam radiation therapy 2008   prostate cancer  . History of pancreatitis 2013  . History of pneumothorax 09/2006   iatrogenic-- in setting cardiac pacemaker placement  . History of prostate cancer 2008   s/p  external radiation therapy  . Hypertension   . Lesion of bladder   . Macular degeneration of right eye   . Mild mitral regurgitation   . Mild pulmonary hypertension ([place])   . Nocturia   . OSA on CPAP   . PAF (paroxysmal atrial fibrillation) ([place]) 10/2011   1st episode documented 09/ 2013 admission for gallstons/ pancreatitis and prior to cholecystectomy , went back in NSR without interventeion  . Presence of permanent cardiac pacemaker   . Sinus node dysfunction (HCC)    s/p  cardiac pacemaker placement 10-11-2006  . Wears glasses   . Wears hearing aid in  both ears     Family History  Problem Relation Age of Onset  . Heart disease Father   . Heart disease Mother   . Colon cancer Mother   . Stroke Mother   . Cancer Sister   . Heart disease Sister   . Heart attack Neg Hx   . Hypertension Neg Hx     Past Surgical History:  Procedure Laterality Date  . BALLOON DILATION N/A 01/14/2015   Procedure: BALLOON DILATION;  Surgeon: Garlan Fair, MD;  Location: [facility];  Service: Endoscopy;  Laterality: N/A;  . BALLOON DILATION N/A 10/12/2017   Procedure: BALLOON DILATION;  Surgeon: Otis Brace, MD;  Location: [facility];  Service: Gastroenterology;  Laterality: N/A;  . BIOPSY  10/12/2017   Procedure: BIOPSY;  Surgeon: Otis Brace, MD;  Location: [facility];  Service: Gastroenterology;;  . CARDIAC CATHETERIZATION  08-29-2000   [place]   no significant obstructive CAD, intact globle LV size and systolic function with regional wall motion abnormalities noted,  ?coronary spasm producing wall motion abnormality, elevated CPK and chest pain (ef 55%)  . CARDIAC PACEMAKER PLACEMENT  10-11-2006    dr Leonia Reeves   W/  ATRIAL LEAD REVISION 10-12-2006   (medtronic)  .  CARDIOVASCULAR STRESS TEST  11-18-2011    dr Irish Lack   Low risk nuclear study w/ no ishemia/  normal wall motion,  post-stress ef 48% (LVEF 56% on prior study 2008)  . CARPAL TUNNEL RELEASE Left 11/14/2014   Procedure: CARPAL TUNNEL RELEASE LEFT THUMB;  Surgeon: Daryll Brod, MD;  Location: [facility];  Service: Orthopedics;  Laterality: Left;  ANESTHESIA:  IV REGIONAL FAB  . CATARACT EXTRACTION W/ INTRAOCULAR LENS  IMPLANT, BILATERAL    . CHOLECYSTECTOMY  10/23/2011  . CHOLECYSTECTOMY  10/23/2011   Procedure: CHOLECYSTECTOMY;  Surgeon: Rolm Bookbinder, MD;  Location: [facility];  Service: General;  Laterality: N/A;  with intraoperative cholangiogram  . COLONOSCOPY    . ESOPHAGOGASTRODUODENOSCOPY (EGD) WITH PROPOFOL N/A 01/14/2015   Procedure: ESOPHAGOGASTRODUODENOSCOPY (EGD) WITH PROPOFOL;  Surgeon: Garlan Fair, MD;  [facility];  Service: Endoscopy;  Laterality: N/A;  . ESOPHAGOGASTRODUODENOSCOPY (EGD) WITH PROPOFOL N/A 10/12/2017   Procedure: ESOPHAGOGASTRODUODENOSCOPY (EGD) WITH PROPOFOL;  Surgeon: Otis Brace, MD;  Location: [facility];  Service: Gastroenterology;  Laterality: N/A;  . I&D EXTREMITY Left 10/20/2012   Procedure: LEFT LEG IRRIGATION AND DEBRIDEMENT, AND WOUND VAC APPLICATION;  Surgeon: Marianna Payment, MD;  [facility];  Service: Orthopedics;  Laterality: Left;  . I&D EXTREMITY Left 10/22/2012   Procedure: IRRIGATION AND DEBRIDEMENT EXTREMITY;  Surgeon: Marianna Payment, MD;  [facility];  Service: Orthopedics;  Laterality: Left;  . INGUINAL HERNIA REPAIR Left yrs ago  . LAPAROSCOPIC ASSISTED VENTRAL HERNIA REPAIR  08-08-2009   dr Excell Seltzer   AND OPEN REPAIR RECURRENT LEFT INGUINAL HERNIA  . LAPAROSCOPIC INGUINAL HERNIA REPAIR Left 07-24-1999    dr Excell Seltzer  . OPEN VENTRAL HERNIA REPAIR /  LYSIS ADHESIONS  09-23-2010    dr Donne Hazel  . ORIF ELBOW FRACTURE Left 03/29/2015   Procedure: LEFT ELBOW OPEN REDUCTION INTERNAL FIXATION (ORIF)  DISTAL HUMERUS FRACTURE WITH OLECRANON OSTEOTOMY AND ULNAR NERVE RELEASE;  Surgeon: Roseanne Kaufman, MD;  Location: [facility];  Service: Orthopedics;  Laterality: Left;  . PPM GENERATOR CHANGEOUT N/A 02/10/2018   Procedure: PPM GENERATOR CHANGEOUT;  Surgeon: Deboraha Sprang, MD;  Location: [facility];  Service: Cardiovascular;  Laterality: N/A;  . REPAIR SUPRAUMBILICAL HERNIA   [id number]  dr Excell Seltzer  . SHOULDER ARTHROSCOPY WITH DISTAL CLAVICLE RESECTION Right 11-07-2007   dr Rhona Raider   [place] ACROMIOPLASTY  . SKIN SPLIT GRAFT Left 10/22/2012   Procedure: SKIN GRAFT SPLIT THICKNESS;  Surgeon: Marianna Payment, MD;  [facility];  Service: Orthopedics;  Laterality: Left;  . TOTAL KNEE ARTHROPLASTY Right 1990's  . TRANSTHORACIC ECHOCARDIOGRAM  06-12-2012   dr Irish Lack   ef 50-55%/ mild LAE/ mild MR and TR/  AV sclerosis without stenosis/  RVSP 71mmHg  . TRANSURETHRAL RESECTION OF BLADDER TUMOR N/A 07/15/2017   Procedure: CYSTOSCOPY TRANSURETHRAL RESECTION OF BLADDER TUMOR (TURBT);  Surgeon: Kathie Rhodes, MD;  [facility];  Service: Urology;  Laterality: N/A;   Social History   Occupational History  . 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 Smoking status: Former Smoker    Packs/day: 1.50    Years: 35.00    Pack years: 52.50    Types: Cigarettes    Quit date: 02/15/1973    Years since quitting: 45.8  . Smokeless tobacco: Never Used  Substance and Sexual Activity  . Alcohol use: No  . Drug use: No  . Sexual activity: Never

## 2018-12-24 ENCOUNTER — Encounter (HOSPITAL_COMMUNITY): Payer: Self-pay | Admitting: *Deleted

## 2018-12-24 ENCOUNTER — Other Ambulatory Visit: Payer: Self-pay

## 2018-12-24 ENCOUNTER — Ambulatory Visit (HOSPITAL_COMMUNITY)
Admission: EM | Admit: 2018-12-24 | Discharge: 2018-12-24 | Disposition: A | Discharge status: Home / Self Care | Payer: Medicare Other | Attending: Urgent Care | Admitting: Urgent Care

## 2018-12-24 DIAGNOSIS — Z5189 Encounter for other specified aftercare: Secondary | ICD-10-CM | POA: Diagnosis not present

## 2018-12-24 DIAGNOSIS — L03115 Cellulitis of right lower limb: Secondary | ICD-10-CM

## 2018-12-24 MED ORDER — MUPIROCIN 2 % EX OINT: 1.0000 "application " | TOPICAL_OINTMENT | Freq: Three times a day (TID) | CUTANEOUS | 0 refills | Status: DC

## 2018-12-24 NOTE — ED Provider Notes (Signed)
MRN: [id number] DOB: 17-Aug-1924  Subjective:   [name] [name] is a 85 y.o. male presenting for recheck on his right lower leg wound.  This is the patient's third visit for the same.  Last office visit was on 12/10/2018.  He has completed course of Keflex and has been very consistent with his wound dressings.  Last dose of cough was about a week ago.  In the past couple of days, patient reports that he has had more drainage and more pain.  States that the pain is sharp and throbbing goes deep into his leg.  Denies having had fever, nausea, vomiting, chest pain, belly pain.   Current Facility-Administered Medications:  .  betamethasone acetate-betamethasone sodium phosphate (CELESTONE) injection 12 mg, 12 mg, Other, Once, Magnus Sinning, MD  Current Outpatient Medications:  .  aspirin 81 MG chewable tablet, Chew 81 mg by mouth every other day., Disp: , Rfl:  .  Calcium Carbonate-Vitamin D (CALCIUM-D PO), Take 1 tablet by mouth daily. , Disp: , Rfl:  .  diphenhydrAMINE (BENADRYL) 25 MG tablet, Take 25 mg by mouth daily as needed for allergies. , Disp: , Rfl:  .  fluticasone (FLONASE) 50 MCG/ACT nasal spray, Place 1 spray into both nostrils daily as needed for allergies or rhinitis., Disp: , Rfl:  .  furosemide (LASIX) 20 MG tablet, Take 20-40 mg by mouth See admin instructions. Alternate taking 20 mg and 40 mg daily, Disp: , Rfl:  .  lisinopril (PRINIVIL,ZESTRIL) 10 MG tablet, Take 10 mg by mouth daily. , Disp: , Rfl:  .  mirabegron ER (MYRBETRIQ) 50 MG TB24 tablet, Take 50 mg by mouth daily., Disp: , Rfl:  .  Multiple Vitamins-Minerals (PRESERVISION AREDS PO), Take 1 tablet by mouth 2 (two) times daily., Disp: , Rfl:  .  oxyCODONE (OXY IR/ROXICODONE) 5 MG immediate release tablet, Take 5 mg by mouth every 6 (six) hours as needed for severe pain., Disp: , Rfl:  .  pantoprazole (PROTONIX) 40 MG tablet, Take 1 tablet (40 mg total) by mouth daily before breakfast., Disp: 90 tablet, Rfl: 1 .   predniSONE (DELTASONE) 10 MG tablet, Take 1 tablet (10 mg total) by mouth every morning., Disp: 90 tablet, Rfl: 0 .  Umeclidinium-Vilanterol (ANORO ELLIPTA) 62.5-25 MCG/INH AEPB, Inhale 1 puff into the lungs daily. , Disp: , Rfl:  .  vitamin B-12 (CYANOCOBALAMIN) 500 MCG tablet, Take 500 mcg by mouth every Monday, Wednesday, and Friday. , Disp: , Rfl:  .  acetaminophen (TYLENOL) 650 MG CR tablet, Take 650 mg by mouth 2 (two) times daily., Disp: , Rfl:  .  albuterol (PROVENTIL HFA;VENTOLIN HFA) 108 (90 Base) MCG/ACT inhaler, Inhale 2 puffs into the lungs every 6 (six) hours as needed for wheezing or shortness of breath., Disp: 1 Inhaler, Rfl: 6 .  oxymetazoline (AFRIN) 0.05 % nasal spray, Place 1 spray into both nostrils 2 (two) times daily as needed for congestion., Disp: , Rfl:  .  WOUND DRESSINGS EX, Apply topically. Apply gel to affected area twice a day, cover with sterile dressing, Disp: , Rfl:    No Known Allergies  Past Medical History:  Diagnosis Date  . Abnormality of gait    due to low back pain , uses  cane and walker  . Arthritis   . Bilateral lower extremity edema    wears TED hose  . Cancer [facility])    prostate- treated with radiation  . Cardiac pacemaker in situ 10/11/2006   medtronic  . Chronic iron  deficiency anemia oncologist/ hematoloist-- dr Alen Blew   treatment IV Iron infusions  . Chronic low back pain   . CKD (chronic kidney disease), stage III   . COPD with emphysema [facility])    pulmologist-  dr Elsworth Soho  . DDD (degenerative disc disease), lumbosacral   . Degenerative scoliosis   . Dyspnea    with exertion  . Full dentures   . GERD (gastroesophageal reflux disease)   . Gross hematuria   . Headache    history of migraines- "way in the past"  . Hereditary and idiopathic peripheral neuropathy    bilateral lower leg  . Hiatal hernia   . History of DVT of lower extremity 10/19/2011   chronic distal popliteal right lower extremity  . History of external beam radiation  therapy 2008   prostate cancer  . History of pancreatitis 2013  . History of pneumothorax 09/2006   iatrogenic-- in setting cardiac pacemaker placement  . History of prostate cancer 2008   s/p  external radiation therapy  . Hypertension   . Lesion of bladder   . Macular degeneration of right eye   . Mild mitral regurgitation   . Mild pulmonary hypertension ([place])   . Nocturia   . OSA on CPAP   . PAF (paroxysmal atrial fibrillation) ([place]) 10/2011   1st episode documented 09/ 2013 admission for gallstons/ pancreatitis and prior to cholecystectomy , went back in NSR without interventeion  . Presence of permanent cardiac pacemaker   . Sinus node dysfunction (HCC)    s/p  cardiac pacemaker placement 10-11-2006  . Wears glasses   . Wears hearing aid in both ears      Past Surgical History:  Procedure Laterality Date  . BALLOON DILATION N/A 01/14/2015   Procedure: BALLOON DILATION;  Surgeon: Garlan Fair, MD;  [facility];  Service: Endoscopy;  Laterality: N/A;  . BALLOON DILATION N/A 10/12/2017   Procedure: BALLOON DILATION;  Surgeon: Otis Brace, MD;  Location: [facility];  Service: Gastroenterology;  Laterality: N/A;  . BIOPSY  10/12/2017   Procedure: BIOPSY;  Surgeon: Otis Brace, MD;  Location: [facility] [facility];  Service: Gastroenterology;;  . CARDIAC CATHETERIZATION  08-29-2000   [place]   no significant obstructive CAD, intact globle LV size and systolic function with regional wall motion abnormalities noted,  ?coronary spasm producing wall motion abnormality, elevated CPK and chest pain (ef 55%)  . CARDIAC PACEMAKER PLACEMENT  10-11-2006    dr Leonia Reeves   W/  ATRIAL LEAD REVISION 10-12-2006   (medtronic)  . CARDIOVASCULAR STRESS TEST  11-18-2011    dr Irish Lack   Low risk nuclear study w/ no ishemia/  normal wall motion,  post-stress ef 48% (LVEF 56% on prior study 2008)  . CARPAL TUNNEL RELEASE Left 11/14/2014   Procedure: CARPAL TUNNEL RELEASE LEFT THUMB;   Surgeon: Daryll Brod, MD;  Location: [facility];  Service: Orthopedics;  Laterality: Left;  ANESTHESIA:  IV REGIONAL FAB  . CATARACT EXTRACTION W/ INTRAOCULAR LENS  IMPLANT, BILATERAL    . CHOLECYSTECTOMY  10/23/2011  . CHOLECYSTECTOMY  10/23/2011   Procedure: CHOLECYSTECTOMY;  Surgeon: Rolm Bookbinder, MD;  Location: [place];  Service: General;  Laterality: N/A;  with intraoperative cholangiogram  . COLONOSCOPY    . ESOPHAGOGASTRODUODENOSCOPY (EGD) WITH PROPOFOL N/A 01/14/2015   Procedure: ESOPHAGOGASTRODUODENOSCOPY (EGD) WITH PROPOFOL;  Surgeon: Garlan Fair, MD;  [facility];  Service: Endoscopy;  Laterality: N/A;  . ESOPHAGOGASTRODUODENOSCOPY (EGD) WITH PROPOFOL N/A 10/12/2017   Procedure: ESOPHAGOGASTRODUODENOSCOPY (  EGD) WITH PROPOFOL;  Surgeon: Otis Brace, MD;  [facility];  Service: Gastroenterology;  Laterality: N/A;  . I&D EXTREMITY Left 10/20/2012   Procedure: LEFT LEG IRRIGATION AND DEBRIDEMENT, AND WOUND VAC APPLICATION;  Surgeon: Marianna Payment, MD;  [facility];  Service: Orthopedics;  Laterality: Left;  . I&D EXTREMITY Left 10/22/2012   Procedure: IRRIGATION AND DEBRIDEMENT EXTREMITY;  Surgeon: Marianna Payment, MD;  [facility];  Service: Orthopedics;  Laterality: Left;  . INGUINAL HERNIA REPAIR Left yrs ago  . LAPAROSCOPIC ASSISTED VENTRAL HERNIA REPAIR  08-08-2009   dr Excell Seltzer   AND OPEN REPAIR RECURRENT LEFT INGUINAL HERNIA  . LAPAROSCOPIC INGUINAL HERNIA REPAIR Left 07-24-1999    dr Excell Seltzer  . OPEN VENTRAL HERNIA REPAIR /  LYSIS ADHESIONS  09-23-2010    dr Donne Hazel  . ORIF ELBOW FRACTURE Left 03/29/2015   Procedure: LEFT ELBOW OPEN REDUCTION INTERNAL FIXATION (ORIF) DISTAL HUMERUS FRACTURE WITH OLECRANON OSTEOTOMY AND ULNAR NERVE RELEASE;  Surgeon: Roseanne Kaufman, MD;  Location: [place];  Service: Orthopedics;  Laterality: Left;  . PPM GENERATOR CHANGEOUT N/A 02/10/2018   Procedure: PPM GENERATOR CHANGEOUT;  Surgeon:  Deboraha Sprang, MD;  Location: [place] [facility];  Service: Cardiovascular;  Laterality: N/A;  . REPAIR SUPRAUMBILICAL HERNIA   [id number]   dr Excell Seltzer  . SHOULDER ARTHROSCOPY WITH DISTAL CLAVICLE RESECTION Right 11-07-2007   dr Rhona Raider   [place] ACROMIOPLASTY  . SKIN SPLIT GRAFT Left 10/22/2012   Procedure: SKIN GRAFT SPLIT THICKNESS;  Surgeon: Marianna Payment, MD;  [facility];  Service: Orthopedics;  Laterality: Left;  . TOTAL KNEE ARTHROPLASTY Right 1990's  . TRANSTHORACIC ECHOCARDIOGRAM  06-12-2012   dr Irish Lack   ef 50-55%/ mild LAE/ mild MR and TR/  AV sclerosis without stenosis/  RVSP 67mmHg  . TRANSURETHRAL RESECTION OF BLADDER TUMOR N/A 07/15/2017   Procedure: CYSTOSCOPY TRANSURETHRAL RESECTION OF BLADDER TUMOR (TURBT);  Surgeon: Kathie Rhodes, MD;  Location: [facility];  Service: Urology;  Laterality: N/A;    ROS  Objective:   Vitals: BP 123/74   Pulse 67   Temp 98.1 F (36.7 C) (Oral)   Resp 20   SpO2 97%   Physical Exam Constitutional:      Appearance: Normal appearance. He is well-developed and normal weight.  HENT:     Head: Normocephalic and atraumatic.     Right Ear: External ear normal.     Left Ear: External ear normal.     Nose: Nose normal.     Mouth/Throat:     Pharynx: Oropharynx is clear.  Eyes:     Extraocular Movements: Extraocular movements intact.     Pupils: Pupils are equal, round, and reactive to light.  Cardiovascular:     Rate and Rhythm: Normal rate.  Pulmonary:     Effort: Pulmonary effort is normal.  Musculoskeletal:     Right lower leg: He exhibits tenderness (about his wound).       Legs:  Neurological:     Mental Status: He is alert and oriented to person, place, and time.  Psychiatric:        Mood and Affect: Mood normal.        Behavior: Behavior normal.      12/24/2018   12/10/2018   12/08/2018     Assessment and Plan :   1. Visit for wound check   2. Cellulitis  of leg, right     Case  reviewed with Dr. Meda Coffee.  Patient has progressed very slowly.  We prefer to avoid another oral course of antibiotic to avoid antibiotic GI complications.  We will try Bactroban ointment.  Recommended patient follow-up with wound care clinic or Dr. Sharol Given who has helped patient previously with his wound care.  At this time patient's vital signs are very stable and I do not believe that he is a candidate for emergency room evaluation.  Counseled patient on potential for adverse effects with medications prescribed/recommended today, strict ER and return-to-clinic precautions discussed, patient verbalized understanding.    Jaynee Eagles, PA-C 12/24/18 1449

## 2018-12-24 NOTE — ED Triage Notes (Addendum)
Reports right lower leg wound "x weeks that we've been fighting it", but states he feels it is not improving "and we are at a standstill".  Denies fevers. Pt uses walker. Ulcer noted to posterior right lower leg with edema to right lower leg; pt using Telfa dressing and Ace wrap.

## 2018-12-24 NOTE — Discharge Instructions (Addendum)
Keep doing dressing changes as you have been but now we are adding the antibiotic ointment. The ointment is to be applied 3 times daily.  You can also contact Dr. Sharol Given as he has helped you with wound care in the past.

## 2018-12-25 ENCOUNTER — Ambulatory Visit (INDEPENDENT_AMBULATORY_CARE_PROVIDER_SITE_OTHER): Payer: Medicare Other | Admitting: Orthopedic Surgery

## 2018-12-25 ENCOUNTER — Encounter: Payer: Self-pay | Admitting: Physician Assistant

## 2018-12-25 VITALS — Ht 69.0 in | Wt 158.0 lb

## 2018-12-25 DIAGNOSIS — L97929 Non-pressure chronic ulcer of unspecified part of left lower leg with unspecified severity: Secondary | ICD-10-CM | POA: Diagnosis not present

## 2018-12-25 DIAGNOSIS — I87333 Chronic venous hypertension (idiopathic) with ulcer and inflammation of bilateral lower extremity: Secondary | ICD-10-CM | POA: Diagnosis not present

## 2018-12-25 DIAGNOSIS — L97919 Non-pressure chronic ulcer of unspecified part of right lower leg with unspecified severity: Secondary | ICD-10-CM

## 2018-12-26 ENCOUNTER — Other Ambulatory Visit: Payer: Self-pay | Admitting: Physician Assistant

## 2018-12-26 ENCOUNTER — Encounter: Payer: Self-pay | Admitting: Orthopedic Surgery

## 2018-12-26 DIAGNOSIS — I739 Peripheral vascular disease, unspecified: Secondary | ICD-10-CM

## 2018-12-26 NOTE — Procedures (Signed)
Lumbosacral Transforaminal Epidural Steroid Injection - Sub-Pedicular Approach with Fluoroscopic Guidance  Patient: [name] [name]      Date of Birth: March 04, 1924 MRN: [id number] PCP: Lawerance Cruel, MD      Visit Date: 11/21/2018   Universal Protocol:    Date/Time: 11/21/2018  Consent Given By: the patient  Position: PRONE  Additional Comments: Vital signs were monitored before and after the procedure. Patient was prepped and draped in the usual sterile fashion. The correct patient, procedure, and site was verified.   Injection Procedure Details:  Procedure Site One Meds Administered:  Meds ordered this encounter  Medications  . betamethasone acetate-betamethasone sodium phosphate (CELESTONE) injection 12 mg    Laterality: Bilateral  Location/Site:  L3-L4  Needle size: 22 G  Needle type: Spinal  Needle Placement: Transforaminal  Findings:    -Comments: Excellent flow of contrast along the nerve and into the epidural space.  Procedure Details: After squaring off the end-plates to get a true AP view, the C-arm was positioned so that an oblique view of the foramen as noted above was visualized. The target area is just inferior to the "nose of the scotty dog" or sub pedicular. The soft tissues overlying this structure were infiltrated with 2-3 ml. of 1% Lidocaine without Epinephrine.  The spinal needle was inserted toward the target using a "trajectory" view along the fluoroscope beam.  Under AP and lateral visualization, the needle was advanced so it did not puncture dura and was located close the 6 O'Clock position of the pedical in AP tracterory. Biplanar projections were used to confirm position. Aspiration was confirmed to be negative for CSF and/or blood. A 1-2 ml. volume of Isovue-250 was injected and flow of contrast was noted at each level. Radiographs were obtained for documentation purposes.   After attaining the desired flow of contrast documented above, a  0.5 to 1.0 ml test dose of 0.25% Marcaine was injected into each respective transforaminal space.  The patient was observed for 90 seconds post injection.  After no sensory deficits were reported, and normal lower extremity motor function was noted,   the above injectate was administered so that equal amounts of the injectate were placed at each foramen (level) into the transforaminal epidural space.   Additional Comments:  The patient tolerated the procedure well Dressing: 2 x 2 sterile gauze and Band-Aid    Post-procedure details: Patient was observed during the procedure. Post-procedure instructions were reviewed.  Patient left the clinic in stable condition.

## 2018-12-26 NOTE — Progress Notes (Signed)
[name] [name] - 85 y.o. male MRN [id number]  Date of birth: Oct 05, 1924  Office Visit Note: Visit Date: 11/21/2018 PCP: Lawerance Cruel, MD Referred by: [name], MD  Subjective: Chief Complaint  Patient presents with  . Lower Back - Pain   HPI: [name] [name] is a 85 y.o. male who comes in today At the request of Dr. Meridee Score for bilateral L3 transforaminal epidural steroid injection for chronic worsening severe low back pain with bilateral radicular type leg pain and claudication.  CT scan of the lumbar spine reviewed with the patient showing multilevel spondylosis and degenerative change as well as severe scoliosis.  Does have stenosis centrally at L3-4.  He reports 7 out of 10 low back pain feels somewhat weak in the legs.  Has had many years of back pain.  We have seen him in the remote past for injection.  ROS Otherwise per HPI.  Assessment & Plan: Visit Diagnoses:  1. Lumbar radiculopathy   2. Spinal stenosis of lumbar region with neurogenic claudication     Plan: No additional findings.   Meds & Orders:  Meds ordered this encounter  Medications  . betamethasone acetate-betamethasone sodium phosphate (CELESTONE) injection 12 mg    Orders Placed This Encounter  Procedures  . XR C-ARM NO REPORT  . Epidural Steroid injection    Follow-up: Return if symptoms worsen or fail to improve.   Procedures: No procedures performed  Lumbosacral Transforaminal Epidural Steroid Injection - Sub-Pedicular Approach with Fluoroscopic Guidance  Patient: [name] [name]      Date of Birth: May 07, 1924 MRN: [id number] PCP: Lawerance Cruel, MD      Visit Date: 11/21/2018   Universal Protocol:    Date/Time: 11/21/2018  Consent Given By: the patient  Position: PRONE  Additional Comments: Vital signs were monitored before and after the procedure. Patient was prepped and draped in the usual sterile fashion. The correct patient, procedure, and site was verified.    Injection Procedure Details:  Procedure Site One Meds Administered:  Meds ordered this encounter  Medications  . betamethasone acetate-betamethasone sodium phosphate (CELESTONE) injection 12 mg    Laterality: Bilateral  Location/Site:  L3-L4  Needle size: 22 G  Needle type: Spinal  Needle Placement: Transforaminal  Findings:    -Comments: Excellent flow of contrast along the nerve and into the epidural space.  Procedure Details: After squaring off the end-plates to get a true AP view, the C-arm was positioned so that an oblique view of the foramen as noted above was visualized. The target area is just inferior to the "nose of the scotty dog" or sub pedicular. The soft tissues overlying this structure were infiltrated with 2-3 ml. of 1% Lidocaine without Epinephrine.  The spinal needle was inserted toward the target using a "trajectory" view along the fluoroscope beam.  Under AP and lateral visualization, the needle was advanced so it did not puncture dura and was located close the 6 O'Clock position of the pedical in AP tracterory. Biplanar projections were used to confirm position. Aspiration was confirmed to be negative for CSF and/or blood. A 1-2 ml. volume of Isovue-250 was injected and flow of contrast was noted at each level. Radiographs were obtained for documentation purposes.   After attaining the desired flow of contrast documented above, a 0.5 to 1.0 ml test dose of 0.25% Marcaine was injected into each respective transforaminal space.  The patient was observed for 90 seconds post injection.  After no sensory deficits  were reported, and normal lower extremity motor function was noted,   the above injectate was administered so that equal amounts of the injectate were placed at each foramen (level) into the transforaminal epidural space.   Additional Comments:  The patient tolerated the procedure well Dressing: 2 x 2 sterile gauze and Band-Aid    Post-procedure  details: Patient was observed during the procedure. Post-procedure instructions were reviewed.  Patient left the clinic in stable condition.    Clinical History: EXAM: CT LUMBAR SPINE WITHOUT CONTRAST  TECHNIQUE: Multidetector CT imaging of the lumbar spine was performed without intravenous contrast administration. Multiplanar CT image reconstructions were also generated.  COMPARISON:  CT 08/16/2017, radiograph 10/16/2018  FINDINGS: Segmentation: 5 lumbar type vertebral bodies with mild pseudoarticulation of the transverse processes of L5 with the adjacent sacrum.  Alignment: Severe levoconvex curvature of the lumbar spine with apex at L2. 2 mm retrolisthesis L1 on L2, 4 mm retrolisthesis L2 on L3. No acute traumatic listhesis. Right lateral listhesis of L3 on L4 of approximately 12 mm.  Vertebrae: No acute fracture. No suspicious osseous lesion. Exuberant anterior and lateral osteophyte formation with bridging osteophytes spanning L1 through L3.  Paraspinal and other soft tissues: Severe atherosclerotic calcification of the aorta and branch vessels. Partial visualization of the cardiac pacer wires. Moderate hiatal hernia with adjacent reactive free fluid versus venous collaterals, incompletely assessed on this non-contrast CT. Included abdominopelvic contents are otherwise unremarkable.  Disc levels:  Level by level evaluation of the lumbar spine below:  T12-L1: Severe intervertebral disc height loss with increasing vacuum phenomenon mild canal stenosis. Severe right neural foraminal stenosis. Mild left neural foraminal narrowing.  L1-L2: Severe disc height loss with partial fusion due to bony osteophyte formation across the left anterolateral disc space. Moderate facet hypertrophic changes. No significant canal stenosis. Mild left foraminal narrowing.  L2-L3: Near complete disc height loss with calcified disc and partial fusion across the left  anterolateral space by bony bridging osteophytes. Posterior spurring and moderate to severe facet hypertrophic changes. Mild canal stenosis. Mild left and moderate to severe right foraminal narrowing.  L3-L4: Near complete disc height loss, vacuum phenomenon, posterior calcified central disc protrusion, moderate facet hypertrophic changes. Posterior ligamentum flavum in folding. Moderate to severe canal stenosis. Mild left and moderate right foraminal narrowing.  L4-L5: Significant disc height loss with vacuum phenomenon. Posterior spurring and a moderate to severe facet hypertrophic changes. No canal stenosis. Mild left and severe right foraminal narrowing.  L5-S1: Complete disc height loss, partial bony fusion across the joint space. Posterior spurring and severe right facet degenerative change. No significant canal stenosis, moderate bilateral foraminal narrowing.  IMPRESSION: 1. Severe levoconvex curvature of the lumbar spine with apex at L2. 2. Multilevel discogenic and facet degenerate changes of the lumbar spine as described above. 3. Moderate to severe canal stenosis at L3-L4. Mild canal stenosis at L2-L3. 4. Severe right neural foraminal stenosis at T12-L1. 5. Severe right foraminal narrowing at L4-5. Moderate to severe right foraminal narrowing at L2-3. Moderate bilateral foraminal narrowing at L5-S1. Additional multilevel changes, as above. 6. Moderate hiatal hernia with some adjacent reactive free fluid versus venous collateralization, incompletely assessed on noncontrast CT exam. Correlate with symptomatology and findings from prior EGD. 7. Aortic Atherosclerosis (ICD10-I70.0).   Electronically Signed   By: Lovena Le M.D.   On: 11/01/2018 20:38   He reports that he quit smoking about 45 years ago. His smoking use included cigarettes. He has a 52.50 pack-year smoking history. He has  never used smokeless tobacco. No results for input(s): HGBA1C, LABURIC in  the last 8760 hours.  Objective:  VS:  HT:    WT:   BMI:     BP:(!) 123/53  HR:80bpm  TEMP: ( )  RESP:  Physical Exam  Ortho Exam Imaging: No results found.  Past Medical/Family/Surgical/Social History: Medications & Allergies reviewed per EMR, new medications updated. Patient Active Problem List   Diagnosis Date Noted  . Sacral fracture, closed ([place]) 04/13/2018  . PCO (posterior capsular opacification), bilateral 05/26/2017  . Weakness of right leg 04/20/2017  . 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 Exudative age-related macular degeneration of right eye with active choroidal neovascularization ([place]) 09/30/2016  . Pseudophakia of both eyes 09/30/2016  . Impingement syndrome of right shoulder 06/16/2016  . Impingement syndrome of left shoulder 06/16/2016  . Bilateral leg edema 06/14/2016  . Dizziness and giddiness 06/26/2015  . Abnormality of gait 06/26/2015  . Anxiety 04/14/2015  . GERD (gastroesophageal reflux disease) 04/03/2015  . OSA (obstructive sleep apnea) 04/03/2015  . HLD (hyperlipidemia) 04/03/2015  . SOB (shortness of breath)   . Left elbow fracture 03/29/2015  . Dizziness 01/01/2015  . LBP (low back pain) 01/01/2015  . Malignant neoplasm of prostate ([place]) 01/01/2015  . BP (high blood pressure) 01/01/2015  . DOE (dyspnea on exertion) 10/26/2013  . Essential hypertension, benign 10/26/2013  . Post-traumatic wound infection 10/17/2012  . Cellulitis 10/17/2012  . Atrial fibrillation ([place]) 10/22/2011  . DVT, lower extremity, distal, chronic ([place]) 10/22/2011  . Pacemaker 10/19/2011  . Prostate cancer ([place]) 10/19/2011  . COPD (chronic obstructive pulmonary disease) with emphysema ([place]) 02/04/2011  . Anemia 12/10/2010   Past Medical History:  Diagnosis Date  . Abnormality of gait    due to low back pain , uses  cane and walker  . Arthritis   . Bilateral lower extremity edema    wears TED hose  . Cancer [facility])     prostate- treated with radiation  . Cardiac pacemaker in situ 10/11/2006   medtronic  . Chronic iron deficiency anemia oncologist/ hematoloist-- dr Alen Blew   treatment IV Iron infusions  . Chronic low back pain   . CKD (chronic kidney disease), stage III   . COPD with emphysema [facility])    pulmologist-  dr Elsworth Soho  . DDD (degenerative disc disease), lumbosacral   . Degenerative scoliosis   . Dyspnea    with exertion  . Full dentures   . GERD (gastroesophageal reflux disease)   . Gross hematuria   . Headache    history of migraines- "way in the past"  . Hereditary and idiopathic peripheral neuropathy    bilateral lower leg  . Hiatal hernia   . History of DVT of lower extremity 10/19/2011   chronic distal popliteal right lower extremity  . History of external beam radiation therapy 2008   prostate cancer  . History of pancreatitis 2013  . History of pneumothorax 09/2006   iatrogenic-- in setting cardiac pacemaker placement  . History of prostate cancer 2008   s/p  external radiation therapy  . Hypertension   . Lesion of bladder   . Macular degeneration of right eye   . Mild mitral regurgitation   . Mild pulmonary hypertension ([place])   . Nocturia   . OSA on CPAP   . PAF (paroxysmal atrial fibrillation) ([place]) 10/2011   1st episode documented 09/ 2013 admission for gallstons/ pancreatitis and prior to cholecystectomy ,  went back in NSR without interventeion  . Presence of permanent cardiac pacemaker   . Sinus node dysfunction ([facility])    s/p  cardiac pacemaker placement 10-11-2006  . Wears glasses   . Wears hearing aid in both ears    Family History  Problem Relation Age of Onset  . Heart disease Father   . Heart disease Mother   . Colon cancer Mother   . Stroke Mother   . Cancer Sister   . Heart disease Sister   . Heart attack Neg Hx   . Hypertension Neg Hx    Past Surgical History:  Procedure Laterality Date  . BALLOON DILATION N/A 01/14/2015   Procedure: BALLOON  DILATION;  Surgeon: Garlan Fair, MD;  Location: [facility];  Service: Endoscopy;  Laterality: N/A;  . BALLOON DILATION N/A 10/12/2017   Procedure: BALLOON DILATION;  Surgeon: Otis Brace, MD;  Location: [facility];  Service: Gastroenterology;  Laterality: N/A;  . BIOPSY  10/12/2017   Procedure: BIOPSY;  Surgeon: Otis Brace, MD;  Location: [place] [facility];  Service: Gastroenterology;;  . CARDIAC CATHETERIZATION  08-29-2000   [place]   no significant obstructive CAD, intact globle LV size and systolic function with regional wall motion abnormalities noted,  ?coronary spasm producing wall motion abnormality, elevated CPK and chest pain (ef 55%)  . CARDIAC PACEMAKER PLACEMENT  10-11-2006    dr Leonia Reeves   W/  ATRIAL LEAD REVISION 10-12-2006   (medtronic)  . CARDIOVASCULAR STRESS TEST  11-18-2011    dr Irish Lack   Low risk nuclear study w/ no ishemia/  normal wall motion,  post-stress ef 48% (LVEF 56% on prior study 2008)  . CARPAL TUNNEL RELEASE Left 11/14/2014   Procedure: CARPAL TUNNEL RELEASE LEFT THUMB;  Surgeon: Daryll Brod, MD;  Location: [place];  Service: Orthopedics;  Laterality: Left;  ANESTHESIA:  IV REGIONAL FAB  . CATARACT EXTRACTION W/ INTRAOCULAR LENS  IMPLANT, BILATERAL    . CHOLECYSTECTOMY  10/23/2011  . CHOLECYSTECTOMY  10/23/2011   Procedure: CHOLECYSTECTOMY;  Surgeon: Rolm Bookbinder, MD;  Location: [facility];  Service: General;  Laterality: N/A;  with intraoperative cholangiogram  . COLONOSCOPY    . ESOPHAGOGASTRODUODENOSCOPY (EGD) WITH PROPOFOL N/A 01/14/2015   Procedure: ESOPHAGOGASTRODUODENOSCOPY (EGD) WITH PROPOFOL;  Surgeon: Garlan Fair, MD;  Location: [facility];  Service: Endoscopy;  Laterality: N/A;  . ESOPHAGOGASTRODUODENOSCOPY (EGD) WITH PROPOFOL N/A 10/12/2017   Procedure: ESOPHAGOGASTRODUODENOSCOPY (EGD) WITH PROPOFOL;  Surgeon: Otis Brace, MD;  Location: [place];  Service: Gastroenterology;  Laterality: N/A;  . I&D EXTREMITY  Left 10/20/2012   Procedure: LEFT LEG IRRIGATION AND DEBRIDEMENT, AND WOUND VAC APPLICATION;  Surgeon: Marianna Payment, MD;  Location: [facility];  Service: Orthopedics;  Laterality: Left;  . I&D EXTREMITY Left 10/22/2012   Procedure: IRRIGATION AND DEBRIDEMENT EXTREMITY;  Surgeon: Marianna Payment, MD;  Location[facility];  Service: Orthopedics;  Laterality: Left;  . INGUINAL HERNIA REPAIR Left yrs ago  . LAPAROSCOPIC ASSISTED VENTRAL HERNIA REPAIR  08-08-2009   dr Excell Seltzer   AND OPEN REPAIR RECURRENT LEFT INGUINAL HERNIA  . LAPAROSCOPIC INGUINAL HERNIA REPAIR Left 07-24-1999    dr Excell Seltzer  . OPEN VENTRAL HERNIA REPAIR /  LYSIS ADHESIONS  09-23-2010    dr Donne Hazel  . ORIF ELBOW FRACTURE Left 03/29/2015   Procedure: LEFT ELBOW OPEN REDUCTION INTERNAL FIXATION (ORIF) DISTAL HUMERUS FRACTURE WITH OLECRANON OSTEOTOMY AND ULNAR NERVE RELEASE;  Surgeon: Roseanne Kaufman, MD;  Location: [place];  Service: Orthopedics;  Laterality: Left;  .  PPM GENERATOR CHANGEOUT N/A 02/10/2018   Procedure: PPM GENERATOR CHANGEOUT;  Surgeon: Deboraha Sprang, MD;  Location: [facility];  Service: Cardiovascular;  Laterality: N/A;  . REPAIR SUPRAUMBILICAL HERNIA   [id number]   dr Excell Seltzer  . SHOULDER ARTHROSCOPY WITH DISTAL CLAVICLE RESECTION Right 11-07-2007   dr Rhona Raider   [place] ACROMIOPLASTY  . SKIN SPLIT GRAFT Left 10/22/2012   Procedure: SKIN GRAFT SPLIT THICKNESS;  Surgeon: Marianna Payment, MD;  Location: [facility];  Service: Orthopedics;  Laterality: Left;  . TOTAL KNEE ARTHROPLASTY Right 1990's  . TRANSTHORACIC ECHOCARDIOGRAM  06-12-2012   dr Irish Lack   ef 50-55%/ mild LAE/ mild MR and TR/  AV sclerosis without stenosis/  RVSP 42mmHg  . TRANSURETHRAL RESECTION OF BLADDER TUMOR N/A 07/15/2017   Procedure: CYSTOSCOPY TRANSURETHRAL RESECTION OF BLADDER TUMOR (TURBT);  Surgeon: Kathie Rhodes, MD;  Location: [facility];  Service: Urology;  Laterality: N/A;   Social History    Occupational History  . 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 Smoking status: Former Smoker    Packs/day: 1.50    Years: 35.00    Pack years: 52.50    Types: Cigarettes    Quit date: 02/15/1973    Years since quitting: 45.8  . Smokeless tobacco: Never Used  Substance and Sexual Activity  . Alcohol use: No  . Drug use: No  . Sexual activity: Never

## 2018-12-26 NOTE — Progress Notes (Signed)
Office Visit Note   Patient: [name] [name]           Date of Birth: Nov 18, 1924           MRN: [id number] Visit Date: 12/25/2018              Requested by: Lawerance Cruel, [place],  [place] [id number] PCP: Lawerance Cruel, MD  Chief Complaint  Patient presents with  . Right Leg - Open Wound      HPI: Patient is a 85 year old gentleman who presents for traumatic venous stasis ulcer right calf.  Patient states that her dog jumped up on his bed and scratched his leg through the blanket patient states this happened about a month ago.  Patient states he did go to urgent care was given an antibiotic and a dry dressing change the patient states he has increased swelling and a small amount of drainage.  Assessment & Plan: Visit Diagnoses:  1. Idiopathic chronic venous hypertension of both lower extremities with ulcer and inflammation ([place])     Plan: Patient was given a prescription for a size large compression stocking.  Recommended using the Bactroban on the toe ulcer where the nail was avulsed.  Follow-Up Instructions: Return in about 1 week (around 01/01/2019).   Ortho Exam  Patient is alert, oriented, no adenopathy, well-dressed, normal affect, normal respiratory effort. Examination patient has a palpable dorsalis pedis pulse Doppler was used he has triphasic pulses by Doppler.  Patient has completely avulsed the left great toenail and this nail was removed.  There is no signs of paronychial infection no exposed bone.  Bactroban and a Band-Aid was applied.  Examination of the right calf patient has pitting edema brawny skin color changes he has a traumatic venous stasis ulcer without cellulitis.  Imaging: No results found. No images are attached to the encounter.  Labs: Lab Results  Component Value Date   CRP 16.2 (H) 10/20/2011   LABURIC 4.7 10/17/2006   REPTSTATUS 03/31/2015 FINAL 03/30/2015   GRAMSTAIN Abundant 12/18/2015   GRAMSTAIN WBC  present-predominately PMN 12/18/2015   GRAMSTAIN No Squamous Epithelial Cells Seen 12/18/2015   GRAMSTAIN No Organisms Seen 12/18/2015   CULT 9,000 COLONIES/mL INSIGNIFICANT GROWTH 03/30/2015   LABORGA NORMAL SKIN FLORA 12/18/2015     Lab Results  Component Value Date   ALBUMIN 4.1 05/18/2016   ALBUMIN 3.2 (L) 10/17/2012   ALBUMIN 1.9 (L) 10/24/2011   LABURIC 4.7 10/17/2006    Lab Results  Component Value Date   MG 1.8 12/11/2018   MG 1.8 08/06/2015   MG 1.7 10/20/2011   No results found for: VD25OH  No results found for: PREALBUMIN CBC EXTENDED Latest Ref Rng & Units 12/11/2018 02/10/2018 07/15/2017  WBC 3.4 - 10.8 x10E3/uL 5.6 - -  RBC 4.14 - 5.80 x10E6/uL 3.04(L) - -  HGB 13.0 - 17.7 g/dL 10.0(L) 10.9(L) 9.9(L)  HCT 37.5 - 51.0 % 30.8(L) 32.0(L) 29.0(L)  PLT 150 - 450 x10E3/uL 160 - -  NEUTROABS 1.5 - 6.5 K/uL - - -  LYMPHSABS 0.9 - 3.3 K/uL - - -     Body mass index is 23.33 kg/m.  Orders:  No orders of the defined types were placed in this encounter.  No orders of the defined types were placed in this encounter.    Procedures: No procedures performed  Clinical Data: No additional findings.  ROS:  All other systems negative, except as noted in the HPI. Review of Systems  Objective: Vital Signs: Ht 5\' 9"  (1.753 m)   Wt 158 lb (71.7 kg)   BMI 23.33 kg/m   Specialty Comments:  No specialty comments available.  PMFS History: Patient Active Problem List   Diagnosis Date Noted  . Sacral fracture, closed ([place]) 04/13/2018  . PCO (posterior capsular opacification), bilateral 05/26/2017  . Weakness of right leg 04/20/2017  . 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 Exudative age-related macular degeneration of right eye with active choroidal neovascularization ([place]) 09/30/2016  . Pseudophakia of both eyes 09/30/2016  . Impingement syndrome of right shoulder 06/16/2016  . Impingement syndrome of left shoulder  06/16/2016  . Bilateral leg edema 06/14/2016  . Dizziness and giddiness 06/26/2015  . Abnormality of gait 06/26/2015  . Anxiety 04/14/2015  . GERD (gastroesophageal reflux disease) 04/03/2015  . OSA (obstructive sleep apnea) 04/03/2015  . HLD (hyperlipidemia) 04/03/2015  . SOB (shortness of breath)   . Left elbow fracture 03/29/2015  . Dizziness 01/01/2015  . LBP (low back pain) 01/01/2015  . Malignant neoplasm of prostate ([place]) 01/01/2015  . BP (high blood pressure) 01/01/2015  . DOE (dyspnea on exertion) 10/26/2013  . Essential hypertension, benign 10/26/2013  . Post-traumatic wound infection 10/17/2012  . Cellulitis 10/17/2012  . Atrial fibrillation ([place]) 10/22/2011  . DVT, lower extremity, distal, chronic ([place]) 10/22/2011  . Pacemaker 10/19/2011  . Prostate cancer ([place]) 10/19/2011  . COPD (chronic obstructive pulmonary disease) with emphysema ([place]) 02/04/2011  . Anemia 12/10/2010   Past Medical History:  Diagnosis Date  . Abnormality of gait    due to low back pain , uses  cane and walker  . Arthritis   . Bilateral lower extremity edema    wears TED hose  . Cancer [facility])    prostate- treated with radiation  . Cardiac pacemaker in situ 10/11/2006   medtronic  . Chronic iron deficiency anemia oncologist/ hematoloist-- dr Alen Blew   treatment IV Iron infusions  . Chronic low back pain   . CKD (chronic kidney disease), stage III   . COPD with emphysema [facility])    pulmologist-  dr Elsworth Soho  . DDD (degenerative disc disease), lumbosacral   . Degenerative scoliosis   . Dyspnea    with exertion  . Full dentures   . GERD (gastroesophageal reflux disease)   . Gross hematuria   . Headache    history of migraines- "way in the past"  . Hereditary and idiopathic peripheral neuropathy    bilateral lower leg  . Hiatal hernia   . History of DVT of lower extremity 10/19/2011   chronic distal popliteal right lower extremity  . History of external beam radiation therapy 2008    prostate cancer  . History of pancreatitis 2013  . History of pneumothorax 09/2006   iatrogenic-- in setting cardiac pacemaker placement  . History of prostate cancer 2008   s/p  external radiation therapy  . Hypertension   . Lesion of bladder   . Macular degeneration of right eye   . Mild mitral regurgitation   . Mild pulmonary hypertension ([place])   . Nocturia   . OSA on CPAP   . PAF (paroxysmal atrial fibrillation) ([place]) 10/2011   1st episode documented 09/ 2013 admission for gallstons/ pancreatitis and prior to cholecystectomy , went back in NSR without interventeion  . Presence of permanent cardiac pacemaker   . Sinus node dysfunction (HCC)    s/p  cardiac pacemaker placement 10-11-2006  . Wears glasses   .  Wears hearing aid in both ears     Family History  Problem Relation Age of Onset  . Heart disease Father   . Heart disease Mother   . Colon cancer Mother   . Stroke Mother   . Cancer Sister   . Heart disease Sister   . Heart attack Neg Hx   . Hypertension Neg Hx     Past Surgical History:  Procedure Laterality Date  . BALLOON DILATION N/A 01/14/2015   Procedure: BALLOON DILATION;  Surgeon: Garlan Fair, MD;  Location: [facility];  Service: Endoscopy;  Laterality: N/A;  . BALLOON DILATION N/A 10/12/2017   Procedure: BALLOON DILATION;  Surgeon: Otis Brace, MD;  Location: [facility];  Service: Gastroenterology;  Laterality: N/A;  . BIOPSY  10/12/2017   Procedure: BIOPSY;  Surgeon: Otis Brace, MD;  Location: [facility];  Service: Gastroenterology;;  . CARDIAC CATHETERIZATION  08-29-2000   [place]   no significant obstructive CAD, intact globle LV size and systolic function with regional wall motion abnormalities noted,  ?coronary spasm producing wall motion abnormality, elevated CPK and chest pain (ef 55%)  . CARDIAC PACEMAKER PLACEMENT  10-11-2006    dr Leonia Reeves   W/  ATRIAL LEAD REVISION 10-12-2006   (medtronic)  . CARDIOVASCULAR STRESS TEST   11-18-2011    dr Irish Lack   Low risk nuclear study w/ no ishemia/  normal wall motion,  post-stress ef 48% (LVEF 56% on prior study 2008)  . CARPAL TUNNEL RELEASE Left 11/14/2014   Procedure: CARPAL TUNNEL RELEASE LEFT THUMB;  Surgeon: Daryll Brod, MD;  Location: [facility];  Service: Orthopedics;  Laterality: Left;  ANESTHESIA:  IV REGIONAL FAB  . CATARACT EXTRACTION W/ INTRAOCULAR LENS  IMPLANT, BILATERAL    . CHOLECYSTECTOMY  10/23/2011  . CHOLECYSTECTOMY  10/23/2011   Procedure: CHOLECYSTECTOMY;  Surgeon: Rolm Bookbinder, MD;  Location: [facility];  Service: General;  Laterality: N/A;  with intraoperative cholangiogram  . COLONOSCOPY    . ESOPHAGOGASTRODUODENOSCOPY (EGD) WITH PROPOFOL N/A 01/14/2015   Procedure: ESOPHAGOGASTRODUODENOSCOPY (EGD) WITH PROPOFOL;  Surgeon: Garlan Fair, MD;  [facility];  Service: Endoscopy;  Laterality: N/A;  . ESOPHAGOGASTRODUODENOSCOPY (EGD) WITH PROPOFOL N/A 10/12/2017   Procedure: ESOPHAGOGASTRODUODENOSCOPY (EGD) WITH PROPOFOL;  Surgeon: Otis Brace, MD;  Location: [facility];  Service: Gastroenterology;  Laterality: N/A;  . I&D EXTREMITY Left 10/20/2012   Procedure: LEFT LEG IRRIGATION AND DEBRIDEMENT, AND WOUND VAC APPLICATION;  Surgeon: Marianna Payment, MD;  [facility];  Service: Orthopedics;  Laterality: Left;  . I&D EXTREMITY Left 10/22/2012   Procedure: IRRIGATION AND DEBRIDEMENT EXTREMITY;  Surgeon: Marianna Payment, MD;  [facility];  Service: Orthopedics;  Laterality: Left;  . INGUINAL HERNIA REPAIR Left yrs ago  . LAPAROSCOPIC ASSISTED VENTRAL HERNIA REPAIR  08-08-2009   dr Excell Seltzer   AND OPEN REPAIR RECURRENT LEFT INGUINAL HERNIA  . LAPAROSCOPIC INGUINAL HERNIA REPAIR Left 07-24-1999    dr Excell Seltzer  . OPEN VENTRAL HERNIA REPAIR /  LYSIS ADHESIONS  09-23-2010    dr Donne Hazel  . ORIF ELBOW FRACTURE Left 03/29/2015   Procedure: LEFT ELBOW OPEN REDUCTION INTERNAL FIXATION (ORIF) DISTAL HUMERUS FRACTURE WITH  OLECRANON OSTEOTOMY AND ULNAR NERVE RELEASE;  Surgeon: Roseanne Kaufman, MD;  Location: [facility];  Service: Orthopedics;  Laterality: Left;  . PPM GENERATOR CHANGEOUT N/A 02/10/2018   Procedure: PPM GENERATOR CHANGEOUT;  Surgeon: Deboraha Sprang, MD;  Location: [facility];  Service: Cardiovascular;  Laterality: N/A;  . REPAIR SUPRAUMBILICAL  HERNIA   08-05-2008   dr Excell Seltzer  . SHOULDER ARTHROSCOPY WITH DISTAL CLAVICLE RESECTION Right 11-07-2007   dr Rhona Raider   [place] ACROMIOPLASTY  . SKIN SPLIT GRAFT Left 10/22/2012   Procedure: SKIN GRAFT SPLIT THICKNESS;  Surgeon: Marianna Payment, MD;  [facility];  Service: Orthopedics;  Laterality: Left;  . TOTAL KNEE ARTHROPLASTY Right 1990's  . TRANSTHORACIC ECHOCARDIOGRAM  06-12-2012   dr Irish Lack   ef 50-55%/ mild LAE/ mild MR and TR/  AV sclerosis without stenosis/  RVSP 55mmHg  . TRANSURETHRAL RESECTION OF BLADDER TUMOR N/A 07/15/2017   Procedure: CYSTOSCOPY TRANSURETHRAL RESECTION OF BLADDER TUMOR (TURBT);  Surgeon: Kathie Rhodes, MD;  Location: [facility];  Service: Urology;  Laterality: N/A;   Social History   Occupational History  . 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 Smoking status: Former Smoker    Packs/day: 1.50    Years: 35.00    Pack years: 52.50    Types: Cigarettes    Quit date: 02/15/1973    Years since quitting: 45.8  . Smokeless tobacco: Never Used  Substance and Sexual Activity  . Alcohol use: No  . Drug use: No  . Sexual activity: Never

## 2019-01-02 ENCOUNTER — Ambulatory Visit (INDEPENDENT_AMBULATORY_CARE_PROVIDER_SITE_OTHER): Payer: Medicare Other | Admitting: Orthopedic Surgery

## 2019-01-02 ENCOUNTER — Encounter: Payer: Self-pay | Admitting: Orthopedic Surgery

## 2019-01-02 VITALS — Ht 69.0 in | Wt 158.0 lb

## 2019-01-02 DIAGNOSIS — M2021 Hallux rigidus, right foot: Secondary | ICD-10-CM | POA: Diagnosis not present

## 2019-01-02 DIAGNOSIS — L97919 Non-pressure chronic ulcer of unspecified part of right lower leg with unspecified severity: Secondary | ICD-10-CM | POA: Diagnosis not present

## 2019-01-02 DIAGNOSIS — L97929 Non-pressure chronic ulcer of unspecified part of left lower leg with unspecified severity: Secondary | ICD-10-CM | POA: Diagnosis not present

## 2019-01-02 DIAGNOSIS — I87333 Chronic venous hypertension (idiopathic) with ulcer and inflammation of bilateral lower extremity: Secondary | ICD-10-CM

## 2019-01-02 MED ORDER — PENTOXIFYLLINE ER 400 MG PO TBCR: 400.0000 mg | EXTENDED_RELEASE_TABLET | Freq: Three times a day (TID) | ORAL | 3 refills | Status: DC

## 2019-01-02 NOTE — Progress Notes (Signed)
Office Visit Note   Patient: [name] [name]           Date of Birth: 16-Aug-1924           MRN: [id number] Visit Date: 01/02/2019              Requested by: Lawerance Cruel, [place],  [place] [id number] PCP: Lawerance Cruel, MD  Chief Complaint  Patient presents with  . Right Leg - Follow-up      HPI: Patient is a 85 year old gentleman who presents in follow-up for several problems.  Patient complains of persistent pain in the hallux rigidus right great toe he states he still has pain despite wearing stiff soled sneakers.  States he has had acute increased venous swelling in the left leg and is wearing double extra-large compression stockings.  Patient states he is on Lasix chronically.  Patient complains of a new venous ulcer in the left leg with a previous venous ulcer in the right leg.  Patient takes a baby aspirin every other day.  Assessment & Plan: Visit Diagnoses:  1. Idiopathic chronic venous hypertension of both lower extremities with ulcer and inflammation (HCC)   2. Hallux rigidus, right foot     Plan: Recommended patient get a extra-large compression stocking this would be a better fit for his calf size.  A prescription was called in for Trental to help with venous insufficiency.  Patient has a venous study scheduled in 2 weeks we will follow-up in 3 weeks.  Follow-Up Instructions: Return in about 3 weeks (around 01/23/2019).   Ortho Exam  Patient is alert, oriented, no adenopathy, well-dressed, normal affect, normal respiratory effort. Examination patient has doughy pitting edema of both lower extremities worse on the left than the right.  He has a new venous stasis ulcer in the left leg which is superficial and 2 cm in diameter with healthy granulation tissue.  Venous ulcer on the right leg is chronic 2 cm in diameter 1 mm deep and this is also healthy and stable no signs of infection.  Patient has a negative Bevelyn Buckles' sign compression  of the venous swelling does not reproduce any pain no clinical signs of DVT he is on aspirin.  Patient does have a good dorsalis pedis pulse no arterial insufficiency.  Patient's left calf measures 38 cm in circumference.  Imaging: No results found. No images are attached to the encounter.  Labs: Lab Results  Component Value Date   CRP 16.2 (H) 10/20/2011   LABURIC 4.7 10/17/2006   REPTSTATUS 03/31/2015 FINAL 03/30/2015   GRAMSTAIN Abundant 12/18/2015   GRAMSTAIN WBC present-predominately PMN 12/18/2015   GRAMSTAIN No Squamous Epithelial Cells Seen 12/18/2015   GRAMSTAIN No Organisms Seen 12/18/2015   CULT 9,000 COLONIES/mL INSIGNIFICANT GROWTH 03/30/2015   LABORGA NORMAL SKIN FLORA 12/18/2015     Lab Results  Component Value Date   ALBUMIN 4.1 05/18/2016   ALBUMIN 3.2 (L) 10/17/2012   ALBUMIN 1.9 (L) 10/24/2011   LABURIC 4.7 10/17/2006    Lab Results  Component Value Date   MG 1.8 12/11/2018   MG 1.8 08/06/2015   MG 1.7 10/20/2011   No results found for: VD25OH  No results found for: PREALBUMIN CBC EXTENDED Latest Ref Rng & Units 12/11/2018 02/10/2018 07/15/2017  WBC 3.4 - 10.8 x10E3/uL 5.6 - -  RBC 4.14 - 5.80 x10E6/uL 3.04(L) - -  HGB 13.0 - 17.7 g/dL 10.0(L) 10.9(L) 9.9(L)  HCT 37.5 - 51.0 % 30.8(L) 32.0(L)  29.0(L)  PLT 150 - 450 x10E3/uL 160 - -  NEUTROABS 1.5 - 6.5 K/uL - - -  LYMPHSABS 0.9 - 3.3 K/uL - - -     Body mass index is 23.33 kg/m.  Orders:  No orders of the defined types were placed in this encounter.  Meds ordered this encounter  Medications  . pentoxifylline (TRENTAL) 400 MG CR tablet    Sig: Take 1 tablet (400 mg total) by mouth 3 (three) times daily with meals.    Dispense:  90 tablet    Refill:  3     Procedures: No procedures performed  Clinical Data: No additional findings.  ROS:  All other systems negative, except as noted in the HPI. Review of Systems  Objective: Vital Signs: Ht 5\' 9"  (1.753 m)   Wt 158 lb (71.7  kg)   BMI 23.33 kg/m   Specialty Comments:  No specialty comments available.  PMFS History: Patient Active Problem List   Diagnosis Date Noted  . Sacral fracture, closed ([place]) 04/13/2018  . PCO (posterior capsular opacification), bilateral 05/26/2017  . Weakness of right leg 04/20/2017  . 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 Exudative age-related macular degeneration of right eye with active choroidal neovascularization ([place]) 09/30/2016  . Pseudophakia of both eyes 09/30/2016  . Impingement syndrome of right shoulder 06/16/2016  . Impingement syndrome of left shoulder 06/16/2016  . Bilateral leg edema 06/14/2016  . Dizziness and giddiness 06/26/2015  . Abnormality of gait 06/26/2015  . Anxiety 04/14/2015  . GERD (gastroesophageal reflux disease) 04/03/2015  . OSA (obstructive sleep apnea) 04/03/2015  . HLD (hyperlipidemia) 04/03/2015  . SOB (shortness of breath)   . Left elbow fracture 03/29/2015  . Dizziness 01/01/2015  . LBP (low back pain) 01/01/2015  . Malignant neoplasm of prostate ([place]) 01/01/2015  . BP (high blood pressure) 01/01/2015  . DOE (dyspnea on exertion) 10/26/2013  . Essential hypertension, benign 10/26/2013  . Post-traumatic wound infection 10/17/2012  . Cellulitis 10/17/2012  . Atrial fibrillation ([place]) 10/22/2011  . DVT, lower extremity, distal, chronic ([place]) 10/22/2011  . Pacemaker 10/19/2011  . Prostate cancer ([place]) 10/19/2011  . COPD (chronic obstructive pulmonary disease) with emphysema ([place]) 02/04/2011  . Anemia 12/10/2010   Past Medical History:  Diagnosis Date  . Abnormality of gait    due to low back pain , uses  cane and walker  . Arthritis   . Bilateral lower extremity edema    wears TED hose  . Cancer [facility])    prostate- treated with radiation  . Cardiac pacemaker in situ 10/11/2006   medtronic  . Chronic iron deficiency anemia oncologist/ hematoloist-- dr Alen Blew   treatment IV Iron  infusions  . Chronic low back pain   . CKD (chronic kidney disease), stage III   . COPD with emphysema [facility])    pulmologist-  dr Elsworth Soho  . DDD (degenerative disc disease), lumbosacral   . Degenerative scoliosis   . Dyspnea    with exertion  . Full dentures   . GERD (gastroesophageal reflux disease)   . Gross hematuria   . Headache    history of migraines- "way in the past"  . Hereditary and idiopathic peripheral neuropathy    bilateral lower leg  . Hiatal hernia   . History of DVT of lower extremity 10/19/2011   chronic distal popliteal right lower extremity  . History of external beam radiation therapy 2008   prostate cancer  . History  of pancreatitis 2013  . History of pneumothorax 09/2006   iatrogenic-- in setting cardiac pacemaker placement  . History of prostate cancer 2008   s/p  external radiation therapy  . Hypertension   . Lesion of bladder   . Macular degeneration of right eye   . Mild mitral regurgitation   . Mild pulmonary hypertension ([place])   . Nocturia   . OSA on CPAP   . PAF (paroxysmal atrial fibrillation) ([place]) 10/2011   1st episode documented 09/ 2013 admission for gallstons/ pancreatitis and prior to cholecystectomy , went back in NSR without interventeion  . Presence of permanent cardiac pacemaker   . Sinus node dysfunction (HCC)    s/p  cardiac pacemaker placement 10-11-2006  . Wears glasses   . Wears hearing aid in both ears     Family History  Problem Relation Age of Onset  . Heart disease Father   . Heart disease Mother   . Colon cancer Mother   . Stroke Mother   . Cancer Sister   . Heart disease Sister   . Heart attack Neg Hx   . Hypertension Neg Hx     Past Surgical History:  Procedure Laterality Date  . BALLOON DILATION N/A 01/14/2015   Procedure: BALLOON DILATION;  Surgeon: Garlan Fair, MD;  [facility];  Service: Endoscopy;  Laterality: N/A;  . BALLOON DILATION N/A 10/12/2017   Procedure: BALLOON DILATION;  Surgeon:  Otis Brace, MD;  Location: [facility];  Service: Gastroenterology;  Laterality: N/A;  . BIOPSY  10/12/2017   Procedure: BIOPSY;  Surgeon: Otis Brace, MD;  Location: [facility];  Service: Gastroenterology;;  . CARDIAC CATHETERIZATION  08-29-2000   [place]   no significant obstructive CAD, intact globle LV size and systolic function with regional wall motion abnormalities noted,  ?coronary spasm producing wall motion abnormality, elevated CPK and chest pain (ef 55%)  . CARDIAC PACEMAKER PLACEMENT  10-11-2006    dr Leonia Reeves   W/  ATRIAL LEAD REVISION 10-12-2006   (medtronic)  . CARDIOVASCULAR STRESS TEST  11-18-2011    dr Irish Lack   Low risk nuclear study w/ no ishemia/  normal wall motion,  post-stress ef 48% (LVEF 56% on prior study 2008)  . CARPAL TUNNEL RELEASE Left 11/14/2014   Procedure: CARPAL TUNNEL RELEASE LEFT THUMB;  Surgeon: Daryll Brod, MD;  Location: [facility];  Service: Orthopedics;  Laterality: Left;  ANESTHESIA:  IV REGIONAL FAB  . CATARACT EXTRACTION W/ INTRAOCULAR LENS  IMPLANT, BILATERAL    . CHOLECYSTECTOMY  10/23/2011  . CHOLECYSTECTOMY  10/23/2011   Procedure: CHOLECYSTECTOMY;  Surgeon: Rolm Bookbinder, MD;  Location: [facility];  Service: General;  Laterality: N/A;  with intraoperative cholangiogram  . COLONOSCOPY    . ESOPHAGOGASTRODUODENOSCOPY (EGD) WITH PROPOFOL N/A 01/14/2015   Procedure: ESOPHAGOGASTRODUODENOSCOPY (EGD) WITH PROPOFOL;  Surgeon: Garlan Fair, MD;  [facility];  Service: Endoscopy;  Laterality: N/A;  . ESOPHAGOGASTRODUODENOSCOPY (EGD) WITH PROPOFOL N/A 10/12/2017   Procedure: ESOPHAGOGASTRODUODENOSCOPY (EGD) WITH PROPOFOL;  Surgeon: Otis Brace, MD;  Location: [facility];  Service: Gastroenterology;  Laterality: N/A;  . I&D EXTREMITY Left 10/20/2012   Procedure: LEFT LEG IRRIGATION AND DEBRIDEMENT, AND WOUND VAC APPLICATION;  Surgeon: Marianna Payment, MD;  [facility];  Service: Orthopedics;  Laterality:  Left;  . I&D EXTREMITY Left 10/22/2012   Procedure: IRRIGATION AND DEBRIDEMENT EXTREMITY;  Surgeon: Marianna Payment, MD;  [facility];  Service: Orthopedics;  Laterality: Left;  . INGUINAL HERNIA REPAIR  Left yrs ago  . LAPAROSCOPIC ASSISTED VENTRAL HERNIA REPAIR  08-08-2009   dr Excell Seltzer   AND OPEN REPAIR RECURRENT LEFT INGUINAL HERNIA  . LAPAROSCOPIC INGUINAL HERNIA REPAIR Left 07-24-1999    dr Excell Seltzer  . OPEN VENTRAL HERNIA REPAIR /  LYSIS ADHESIONS  09-23-2010    dr Donne Hazel  . ORIF ELBOW FRACTURE Left 03/29/2015   Procedure: LEFT ELBOW OPEN REDUCTION INTERNAL FIXATION (ORIF) DISTAL HUMERUS FRACTURE WITH OLECRANON OSTEOTOMY AND ULNAR NERVE RELEASE;  Surgeon: Roseanne Kaufman, MD;  Location: [facility];  Service: Orthopedics;  Laterality: Left;  . PPM GENERATOR CHANGEOUT N/A 02/10/2018   Procedure: PPM GENERATOR CHANGEOUT;  Surgeon: Deboraha Sprang, MD;  Location: [facility];  Service: Cardiovascular;  Laterality: N/A;  . REPAIR SUPRAUMBILICAL HERNIA   [id number]   dr Excell Seltzer  . SHOULDER ARTHROSCOPY WITH DISTAL CLAVICLE RESECTION Right 11-07-2007   dr Rhona Raider   [place] ACROMIOPLASTY  . SKIN SPLIT GRAFT Left 10/22/2012   Procedure: SKIN GRAFT SPLIT THICKNESS;  Surgeon: Marianna Payment, MD;  [facility];  Service: Orthopedics;  Laterality: Left;  . TOTAL KNEE ARTHROPLASTY Right 1990's  . TRANSTHORACIC ECHOCARDIOGRAM  06-12-2012   dr Irish Lack   ef 50-55%/ mild LAE/ mild MR and TR/  AV sclerosis without stenosis/  RVSP 45mmHg  . TRANSURETHRAL RESECTION OF BLADDER TUMOR N/A 07/15/2017   Procedure: CYSTOSCOPY TRANSURETHRAL RESECTION OF BLADDER TUMOR (TURBT);  Surgeon: Kathie Rhodes, MD;  Location[facility];  Service: Urology;  Laterality: N/A;   Social History   Occupational History  . 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 Smoking status: Former Smoker    Packs/day: 1.50    Years:  35.00    Pack years: 52.50    Types: Cigarettes    Quit date: 02/15/1973    Years since quitting: 45.9  . Smokeless tobacco: Never Used  Substance and Sexual Activity  . Alcohol use: No  . Drug use: No  . Sexual activity: Never

## 2019-01-08 ENCOUNTER — Other Ambulatory Visit: Payer: Self-pay

## 2019-01-15 ENCOUNTER — Other Ambulatory Visit: Payer: Self-pay

## 2019-01-15 ENCOUNTER — Ambulatory Visit (HOSPITAL_COMMUNITY)
Admission: RE | Admit: 2019-01-15 | Discharge: 2019-01-15 | Disposition: A | Discharge status: Home / Self Care | Payer: Medicare Other | Source: Ambulatory Visit | Attending: Internal Medicine | Admitting: Internal Medicine

## 2019-01-15 DIAGNOSIS — I739 Peripheral vascular disease, unspecified: Secondary | ICD-10-CM

## 2019-01-15 DIAGNOSIS — M79605 Pain in left leg: Secondary | ICD-10-CM | POA: Diagnosis not present

## 2019-01-15 DIAGNOSIS — M79604 Pain in right leg: Secondary | ICD-10-CM

## 2019-01-16 ENCOUNTER — Encounter: Payer: Self-pay | Admitting: Orthopedic Surgery

## 2019-01-16 ENCOUNTER — Ambulatory Visit (INDEPENDENT_AMBULATORY_CARE_PROVIDER_SITE_OTHER): Payer: Medicare Other | Admitting: Orthopedic Surgery

## 2019-01-16 VITALS — Ht 69.0 in | Wt 158.0 lb

## 2019-01-16 DIAGNOSIS — M7541 Impingement syndrome of right shoulder: Secondary | ICD-10-CM | POA: Diagnosis not present

## 2019-01-16 DIAGNOSIS — M5442 Lumbago with sciatica, left side: Secondary | ICD-10-CM

## 2019-01-16 DIAGNOSIS — I87333 Chronic venous hypertension (idiopathic) with ulcer and inflammation of bilateral lower extremity: Secondary | ICD-10-CM | POA: Diagnosis not present

## 2019-01-16 DIAGNOSIS — M5441 Lumbago with sciatica, right side: Secondary | ICD-10-CM

## 2019-01-16 DIAGNOSIS — L97929 Non-pressure chronic ulcer of unspecified part of left lower leg with unspecified severity: Secondary | ICD-10-CM

## 2019-01-16 DIAGNOSIS — L97919 Non-pressure chronic ulcer of unspecified part of right lower leg with unspecified severity: Secondary | ICD-10-CM | POA: Diagnosis not present

## 2019-01-16 MED ORDER — METHOCARBAMOL 500 MG PO TABS: 500.0000 mg | ORAL_TABLET | Freq: Three times a day (TID) | ORAL | 1 refills | Status: DC | PRN

## 2019-01-16 NOTE — Progress Notes (Signed)
Office Visit Note   Patient: [name] [name]           Date of Birth: 05-25-1924           MRN: [id number] Visit Date: 01/16/2019              Requested by: Lawerance Cruel, [place],  [place] [address] PCP: Lawerance Cruel, MD  Chief Complaint  Patient presents with  . Lower Back - Pain      HPI: Patient is a 85 year old gentleman who presents in follow-up for chronic lower back pain with bilateral radicular symptoms.  Patient states that his right shoulder has flared up again due to overuse he states the injection did help.  Patient states that prednisone did not help his back the epidural steroid injection did not help.  He states he has had Neurontin years ago which also did not help he states he does get some pain relief with his Percocet.  Patient states he has not used a muscle relaxer before.  Assessment & Plan: Visit Diagnoses:  1. Idiopathic chronic venous hypertension of both lower extremities with ulcer and inflammation ([place])   2. Impingement syndrome of right shoulder   3. Midline low back pain with bilateral sciatica, unspecified chronicity     Plan: Prescription for Robaxin 500 mg was called and he could use this up to three times a day to see if this would help his back symptoms.  At follow-up will reevaluate the legs and will see if he needs an injection for the shoulder.  Follow-Up Instructions: Return in about 2 weeks (around 01/30/2019).   Ortho Exam  Patient is alert, oriented, no adenopathy, well-dressed, normal affect, normal respiratory effort. Examination patient has pain with passive range of motion of the right shoulder.  He has no focal motor weakness in either lower extremity.  Currently wearing knee-high compression stockings.  Negative straight leg raise bilaterally.  Patient states that his back feels better while sitting walking gets worse.  He also complains of leg pain at night.  Recent cardiology study showed no  arterial insufficiency for his legs.  Imaging: No results found. No images are attached to the encounter.  Labs: Lab Results  Component Value Date   CRP 16.2 (H) 10/20/2011   LABURIC 4.7 10/17/2006   REPTSTATUS 03/31/2015 FINAL 03/30/2015   GRAMSTAIN Abundant 12/18/2015   GRAMSTAIN WBC present-predominately PMN 12/18/2015   GRAMSTAIN No Squamous Epithelial Cells Seen 12/18/2015   GRAMSTAIN No Organisms Seen 12/18/2015   CULT 9,000 COLONIES/mL INSIGNIFICANT GROWTH 03/30/2015   LABORGA NORMAL SKIN FLORA 12/18/2015     Lab Results  Component Value Date   ALBUMIN 4.1 05/18/2016   ALBUMIN 3.2 (L) 10/17/2012   ALBUMIN 1.9 (L) 10/24/2011   LABURIC 4.7 10/17/2006    Lab Results  Component Value Date   MG 1.8 12/11/2018   MG 1.8 08/06/2015   MG 1.7 10/20/2011   No results found for: VD25OH  No results found for: PREALBUMIN CBC EXTENDED Latest Ref Rng & Units 12/11/2018 02/10/2018 07/15/2017  WBC 3.4 - 10.8 x10E3/uL 5.6 - -  RBC 4.14 - 5.80 x10E6/uL 3.04(L) - -  HGB 13.0 - 17.7 g/dL 10.0(L) 10.9(L) 9.9(L)  HCT 37.5 - 51.0 % 30.8(L) 32.0(L) 29.0(L)  PLT 150 - 450 x10E3/uL 160 - -  NEUTROABS 1.5 - 6.5 K/uL - - -  LYMPHSABS 0.9 - 3.3 K/uL - - -     Body mass index is 23.33  kg/m.  Orders:  No orders of the defined types were placed in this encounter.  Meds ordered this encounter  Medications  . methocarbamol (ROBAXIN) 500 MG tablet    Sig: Take 1 tablet (500 mg total) by mouth every 8 (eight) hours as needed for muscle spasms.    Dispense:  30 tablet    Refill:  1     Procedures: No procedures performed  Clinical Data: No additional findings.  ROS:  All other systems negative, except as noted in the HPI. Review of Systems  Objective: Vital Signs: Ht 5\' 9"  (1.753 m)   Wt 158 lb (71.7 kg)   BMI 23.33 kg/m   Specialty Comments:  No specialty comments available.  PMFS History: Patient Active Problem List   Diagnosis Date Noted  . Sacral fracture,  closed ([place]) 04/13/2018  . PCO (posterior capsular opacification), bilateral 05/26/2017  . Weakness of right leg 04/20/2017  . 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 Exudative age-related macular degeneration of right eye with active choroidal neovascularization ([place]) 09/30/2016  . Pseudophakia of both eyes 09/30/2016  . Impingement syndrome of right shoulder 06/16/2016  . Impingement syndrome of left shoulder 06/16/2016  . Bilateral leg edema 06/14/2016  . Dizziness and giddiness 06/26/2015  . Abnormality of gait 06/26/2015  . Anxiety 04/14/2015  . GERD (gastroesophageal reflux disease) 04/03/2015  . OSA (obstructive sleep apnea) 04/03/2015  . HLD (hyperlipidemia) 04/03/2015  . SOB (shortness of breath)   . Left elbow fracture 03/29/2015  . Dizziness 01/01/2015  . LBP (low back pain) 01/01/2015  . Malignant neoplasm of prostate ([place]) 01/01/2015  . BP (high blood pressure) 01/01/2015  . DOE (dyspnea on exertion) 10/26/2013  . Essential hypertension, benign 10/26/2013  . Post-traumatic wound infection 10/17/2012  . Cellulitis 10/17/2012  . Atrial fibrillation ([place]) 10/22/2011  . DVT, lower extremity, distal, chronic ([place]) 10/22/2011  . Pacemaker 10/19/2011  . Prostate cancer ([place]) 10/19/2011  . COPD (chronic obstructive pulmonary disease) with emphysema ([place]) 02/04/2011  . Anemia 12/10/2010   Past Medical History:  Diagnosis Date  . Abnormality of gait    due to low back pain , uses  cane and walker  . Arthritis   . Bilateral lower extremity edema    wears TED hose  . Cancer [facility])    prostate- treated with radiation  . Cardiac pacemaker in situ 10/11/2006   medtronic  . Chronic iron deficiency anemia oncologist/ hematoloist-- dr Alen Blew   treatment IV Iron infusions  . Chronic low back pain   . CKD (chronic kidney disease), stage III   . COPD with emphysema [facility])    pulmologist-  dr Elsworth Soho  . DDD (degenerative disc disease),  lumbosacral   . Degenerative scoliosis   . Dyspnea    with exertion  . Full dentures   . GERD (gastroesophageal reflux disease)   . Gross hematuria   . Headache    history of migraines- "way in the past"  . Hereditary and idiopathic peripheral neuropathy    bilateral lower leg  . Hiatal hernia   . History of DVT of lower extremity 10/19/2011   chronic distal popliteal right lower extremity  . History of external beam radiation therapy 2008   prostate cancer  . History of pancreatitis 2013  . History of pneumothorax 09/2006   iatrogenic-- in setting cardiac pacemaker placement  . History of prostate cancer 2008   s/p  external radiation therapy  . Hypertension   .  Lesion of bladder   . Macular degeneration of right eye   . Mild mitral regurgitation   . Mild pulmonary hypertension ([place])   . Nocturia   . OSA on CPAP   . PAF (paroxysmal atrial fibrillation) ([place]) 10/2011   1st episode documented 09/ 2013 admission for gallstons/ pancreatitis and prior to cholecystectomy , went back in NSR without interventeion  . Presence of permanent cardiac pacemaker   . Sinus node dysfunction ([facility])    s/p  cardiac pacemaker placement 10-11-2006  . Wears glasses   . Wears hearing aid in both ears     Family History  Problem Relation Age of Onset  . Heart disease Father   . Heart disease Mother   . Colon cancer Mother   . Stroke Mother   . Cancer Sister   . Heart disease Sister   . Heart attack Neg Hx   . Hypertension Neg Hx     Past Surgical History:  Procedure Laterality Date  . BALLOON DILATION N/A 01/14/2015   Procedure: BALLOON DILATION;  Surgeon: Garlan Fair, MD;  Location: [facility];  Service: Endoscopy;  Laterality: N/A;  . BALLOON DILATION N/A 10/12/2017   Procedure: BALLOON DILATION;  Surgeon: Otis Brace, MD;  Location: [facility];  Service: Gastroenterology;  Laterality: N/A;  . BIOPSY  10/12/2017   Procedure: BIOPSY;  Surgeon: Otis Brace, MD;   Location: [facility];  Service: Gastroenterology;;  . CARDIAC CATHETERIZATION  08-29-2000   [place]   no significant obstructive CAD, intact globle LV size and systolic function with regional wall motion abnormalities noted,  ?coronary spasm producing wall motion abnormality, elevated CPK and chest pain (ef 55%)  . CARDIAC PACEMAKER PLACEMENT  10-11-2006    dr Leonia Reeves   W/  ATRIAL LEAD REVISION 10-12-2006   (medtronic)  . CARDIOVASCULAR STRESS TEST  11-18-2011    dr Irish Lack   Low risk nuclear study w/ no ishemia/  normal wall motion,  post-stress ef 48% (LVEF 56% on prior study 2008)  . CARPAL TUNNEL RELEASE Left 11/14/2014   Procedure: CARPAL TUNNEL RELEASE LEFT THUMB;  Surgeon: Daryll Brod, MD;  Location: [facility];  Service: Orthopedics;  Laterality: Left;  ANESTHESIA:  IV REGIONAL FAB  . CATARACT EXTRACTION W/ INTRAOCULAR LENS  IMPLANT, BILATERAL    . CHOLECYSTECTOMY  10/23/2011  . CHOLECYSTECTOMY  10/23/2011   Procedure: CHOLECYSTECTOMY;  Surgeon: Rolm Bookbinder, MD;  Location: [facility];  Service: General;  Laterality: N/A;  with intraoperative cholangiogram  . COLONOSCOPY    . ESOPHAGOGASTRODUODENOSCOPY (EGD) WITH PROPOFOL N/A 01/14/2015   Procedure: ESOPHAGOGASTRODUODENOSCOPY (EGD) WITH PROPOFOL;  Surgeon: Garlan Fair, MD;  Location: [facility];  Service: Endoscopy;  Laterality: N/A;  . ESOPHAGOGASTRODUODENOSCOPY (EGD) WITH PROPOFOL N/A 10/12/2017   Procedure: ESOPHAGOGASTRODUODENOSCOPY (EGD) WITH PROPOFOL;  Surgeon: Otis Brace, MD;  Location: [facility];  Service: Gastroenterology;  Laterality: N/A;  . I&D EXTREMITY Left 10/20/2012   Procedure: LEFT LEG IRRIGATION AND DEBRIDEMENT, AND WOUND VAC APPLICATION;  Surgeon: Marianna Payment, MD;  Location: [facility];  Service: Orthopedics;  Laterality: Left;  . I&D EXTREMITY Left 10/22/2012   Procedure: IRRIGATION AND DEBRIDEMENT EXTREMITY;  Surgeon: Marianna Payment, MD;  Location: [facility];  Service: Orthopedics;   Laterality: Left;  . INGUINAL HERNIA REPAIR Left yrs ago  . LAPAROSCOPIC ASSISTED VENTRAL HERNIA REPAIR  08-08-2009   dr Excell Seltzer   AND OPEN REPAIR RECURRENT LEFT INGUINAL HERNIA  . LAPAROSCOPIC INGUINAL HERNIA REPAIR Left 07-24-1999    dr  hoxworth  . OPEN VENTRAL HERNIA REPAIR /  LYSIS ADHESIONS  09-23-2010    dr Donne Hazel  . ORIF ELBOW FRACTURE Left 03/29/2015   Procedure: LEFT ELBOW OPEN REDUCTION INTERNAL FIXATION (ORIF) DISTAL HUMERUS FRACTURE WITH OLECRANON OSTEOTOMY AND ULNAR NERVE RELEASE;  Surgeon: Roseanne Kaufman, MD;  Location: [facility];  Service: Orthopedics;  Laterality: Left;  . PPM GENERATOR CHANGEOUT N/A 02/10/2018   Procedure: PPM GENERATOR CHANGEOUT;  Surgeon: Deboraha Sprang, MD;  Location: [facility];  Service: Cardiovascular;  Laterality: N/A;  . REPAIR SUPRAUMBILICAL HERNIA   [id number]   dr Excell Seltzer  . SHOULDER ARTHROSCOPY WITH DISTAL CLAVICLE RESECTION Right 11-07-2007   dr Rhona Raider   [place] [facility]OMI[facility]  . SKIN SPLIT GRAFT Left 10/22/2012   Procedure: SKIN GRAFT SPLIT THICKNESS;  Surgeon: Marianna Payment, MD;  Location: [facility];  Service: Orthopedics;  Laterality: Left;  . TOTAL KNEE ARTHROPLASTY Right 1990's  . TRANSTHORACIC ECHOCARDIOGRAM  06-12-2012   dr Irish Lack   ef 50-55%/ mild LAE/ mild MR and TR/  AV sclerosis without stenosis/  RVSP 22mmHg  . TRANSURETHRAL RESECTION OF BLADDER TUMOR N/A 07/15/2017   Procedure: CYSTOSCOPY TRANSURETHRAL RESECTION OF BLADDER TUMOR (TURBT);  Surgeon: Kathie Rhodes, MD;  [facility];  Service: Urology;  Laterality: N/A;   Social History   Occupational History  . 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 Smoking status: Former Smoker    Packs/day: 1.50    Years: 35.00    Pack years: 52.50    Types: Cigarettes    Quit date: 02/15/1973    Years since quitting: 45.9  . Smokeless tobacco: Never Used  Substance and Sexual Activity   . Alcohol use: No  . Drug use: No  . Sexual activity: Never

## 2019-01-18 ENCOUNTER — Ambulatory Visit (INDEPENDENT_AMBULATORY_CARE_PROVIDER_SITE_OTHER): Payer: Medicare Other | Admitting: Pulmonary Disease

## 2019-01-18 ENCOUNTER — Other Ambulatory Visit: Payer: Self-pay

## 2019-01-18 ENCOUNTER — Encounter: Payer: Self-pay | Admitting: Pulmonary Disease

## 2019-01-18 DIAGNOSIS — J432 Centrilobular emphysema: Secondary | ICD-10-CM | POA: Diagnosis not present

## 2019-01-18 DIAGNOSIS — G4733 Obstructive sleep apnea (adult) (pediatric): Secondary | ICD-10-CM | POA: Diagnosis not present

## 2019-01-18 NOTE — Assessment & Plan Note (Signed)
Continue Anoro 

## 2019-01-18 NOTE — Patient Instructions (Signed)
CPAP is working well on 10 cm Discuss with PCP about stopping lisinopril

## 2019-01-18 NOTE — Progress Notes (Signed)
Subjective:    Patient ID: [name] [name], male    DOB: 13-May-1924, 85 y.o.   MRN: [id number]  HPI   I connected with  [name] [name] on 01/18/19 by phone and verified that I am speaking with the correct person using two identifiers.   I discussed the limitations of evaluation and management by telemedicine. The patient expressed understanding and agreed to proceed.   85 yo for follow-up of COPD and OSA PMH -cardiomyopathy, pacemaker  He has been maintained on lisinopril for many years, denies cough but has struggled with dizziness over the past 2 to 3 years.  "Nobody seems to be able to figure out why"  Dyspnea is stable, he cannot tell whether Anoro is helping or not.  He takes prednisone 'every now and then' for his back pain prescribed by Dr. Sharol Given  He got a new CPAP machine a year ago and this seems to be working well.  He denies any problems with mask or pressure. CPAP download shows good compliance more than 5.5 hours every night, no missed nights, on 10 cm with no residual events and large leak.  When I asked him about the leak, he does say that he just got some new supplies   Significant tests/ events  [place] 2015: FEV1 1.92 (63%), ratio 55     Past Medical History:  Diagnosis Date  . Abnormality of gait    due to low back pain , uses  cane and walker  . Arthritis   . Bilateral lower extremity edema    wears TED hose  . Cancer [facility])    prostate- treated with radiation  . Cardiac pacemaker in situ 10/11/2006   medtronic  . Chronic iron deficiency anemia oncologist/ hematoloist-- dr Alen Blew   treatment IV Iron infusions  . Chronic low back pain   . CKD (chronic kidney disease), stage III   . COPD with emphysema [facility])    pulmologist-  dr Elsworth Soho  . DDD (degenerative disc disease), lumbosacral   . Degenerative scoliosis   . Dyspnea    with exertion  . Full dentures   . GERD (gastroesophageal reflux disease)   . Gross hematuria   . Headache    history of  migraines- "way in the past"  . Hereditary and idiopathic peripheral neuropathy    bilateral lower leg  . Hiatal hernia   . History of DVT of lower extremity 10/19/2011   chronic distal popliteal right lower extremity  . History of external beam radiation therapy 2008   prostate cancer  . History of pancreatitis 2013  . History of pneumothorax 09/2006   iatrogenic-- in setting cardiac pacemaker placement  . History of prostate cancer 2008   s/p  external radiation therapy  . Hypertension   . Lesion of bladder   . Macular degeneration of right eye   . Mild mitral regurgitation   . Mild pulmonary hypertension ([place])   . Nocturia   . OSA on CPAP   . PAF (paroxysmal atrial fibrillation) ([place]) 10/2011   1st episode documented 09/ 2013 admission for gallstons/ pancreatitis and prior to cholecystectomy , went back in NSR without interventeion  . Presence of permanent cardiac pacemaker   . Sinus node dysfunction (HCC)    s/p  cardiac pacemaker placement 10-11-2006  . Wears glasses   . Wears hearing aid in both ears       Review of Systems     Objective:   Physical Exam  Assessment & Plan:

## 2019-01-18 NOTE — Assessment & Plan Note (Signed)
CPAP seems to be working well on 10 cm he does have a large leak which is got some new supplies and hopefully this will fix that problem He is very compliant and CPAP is generally helped improve his somnolence and fatigue  Weight loss encouraged, compliance with goal of at least 4-6 hrs every night is the expectation. Advised against medications with sedative side effects Cautioned against driving when sleepy - understanding that sleepiness will vary on a day to day basis

## 2019-01-23 ENCOUNTER — Ambulatory Visit (INDEPENDENT_AMBULATORY_CARE_PROVIDER_SITE_OTHER): Payer: Medicare Other | Admitting: Family

## 2019-01-23 ENCOUNTER — Encounter: Payer: Self-pay | Admitting: Family

## 2019-01-23 ENCOUNTER — Ambulatory Visit: Payer: Medicare Other | Admitting: Orthopedic Surgery

## 2019-01-23 ENCOUNTER — Other Ambulatory Visit: Payer: Self-pay

## 2019-01-23 VITALS — Ht 69.0 in | Wt 158.0 lb

## 2019-01-23 DIAGNOSIS — M7541 Impingement syndrome of right shoulder: Secondary | ICD-10-CM

## 2019-01-23 MED ORDER — METHYLPREDNISOLONE ACETATE 40 MG/ML IJ SUSP
40.0000 mg | INTRAMUSCULAR | Status: AC | PRN
  Administered 2019-01-23: 40 mg via INTRA_ARTICULAR

## 2019-01-23 MED ORDER — LIDOCAINE HCL 1 % IJ SOLN
5.0000 mL | INTRAMUSCULAR | Status: AC | PRN
  Administered 2019-01-23: 5 mL

## 2019-01-23 NOTE — Progress Notes (Signed)
Office Visit Note   Patient: [name] [name]           Date of Birth: 1924-11-14           MRN: [id number] Visit Date: 01/23/2019              Requested by: Lawerance Cruel, [place],  [place] [id number] PCP: Lawerance Cruel, MD  Chief Complaint  Patient presents with  . Left Leg - Pain, Follow-up  . Right Leg - Pain, Follow-up  . Right Shoulder - Pain, Follow-up  . Lower Back - Pain, Follow-up      HPI: This is a 85 year old gentleman who presents for follow-up on his low back pain as well his his bilateral lower extremity ulcers and is also requesting an injection into his right shoulder.  He has been wearing his compression hose and feels things are improving at his last visit he was giving a prescription for Robaxin which he just started taking so is unsure if this is helping him.  He has a long history of impingement syndrome in his right shoulder which is helped with injections  Assessment & Plan: Visit Diagnoses: No diagnosis found.  Plan: He will continue using his compression socks follow-up in 1 month for reevaluation  Follow-Up Instructions: No follow-ups on file.   Ortho Exam  Patient is alert, oriented, no adenopathy, well-dressed, normal affect, normal respiratory effort. Bilateral lower extremities swelling is improved left foot is still more swollen than right.  On the left leg the lateral ulcer has completely scabbed over with no surrounding erythema or cellulitis on the right ulcer is very superficial and is 4 cm in diameter with good central granulation tissue there is no significant active drainage  Right shoulder demonstrates impingement symptoms no swelling  Imaging: No results found. No images are attached to the encounter.  Labs: Lab Results  Component Value Date   CRP 16.2 (H) 10/20/2011   LABURIC 4.7 10/17/2006   REPTSTATUS 03/31/2015 FINAL 03/30/2015   GRAMSTAIN Abundant 12/18/2015   GRAMSTAIN WBC  present-predominately PMN 12/18/2015   GRAMSTAIN No Squamous Epithelial Cells Seen 12/18/2015   GRAMSTAIN No Organisms Seen 12/18/2015   CULT 9,000 COLONIES/mL INSIGNIFICANT GROWTH 03/30/2015   LABORGA NORMAL SKIN FLORA 12/18/2015     Lab Results  Component Value Date   ALBUMIN 4.1 05/18/2016   ALBUMIN 3.2 (L) 10/17/2012   ALBUMIN 1.9 (L) 10/24/2011   LABURIC 4.7 10/17/2006    Lab Results  Component Value Date   MG 1.8 12/11/2018   MG 1.8 08/06/2015   MG 1.7 10/20/2011   No results found for: VD25OH  No results found for: PREALBUMIN CBC EXTENDED Latest Ref Rng & Units 12/11/2018 02/10/2018 07/15/2017  WBC 3.4 - 10.8 x10E3/uL 5.6 - -  RBC 4.14 - 5.80 x10E6/uL 3.04(L) - -  HGB 13.0 - 17.7 g/dL 10.0(L) 10.9(L) 9.9(L)  HCT 37.5 - 51.0 % 30.8(L) 32.0(L) 29.0(L)  PLT 150 - 450 x10E3/uL 160 - -  NEUTROABS 1.5 - 6.5 K/uL - - -  LYMPHSABS 0.9 - 3.3 K/uL - - -     Body mass index is 23.33 kg/m.  Orders:  No orders of the defined types were placed in this encounter.  No orders of the defined types were placed in this encounter.    Procedures: Large Joint Inj: R glenohumeral on 01/23/2019 3:00 PM Indications: pain Details: 22 G 1.5 in needle, posterior approach  Arthrogram: No  Medications: 5 mL  lidocaine 1 %; 40 mg methylPREDNISolone acetate 40 MG/ML Outcome: tolerated well, no immediate complications Procedure, treatment alternatives, risks and benefits explained, specific risks discussed. Consent was given by the patient. Immediately prior to procedure a time out was called to verify the correct patient, procedure, equipment, support staff and site/side marked as required. Patient was prepped and draped in the usual sterile fashion.      Clinical Data: No additional findings.  ROS:  All other systems negative, except as noted in the HPI. Review of Systems  Objective: Vital Signs: Ht 5\' 9"  (1.753 m)   Wt 158 lb (71.7 kg)   BMI 23.33 kg/m   Specialty  Comments:  No specialty comments available.  PMFS History: Patient Active Problem List   Diagnosis Date Noted  . Sacral fracture, closed ([place]) 04/13/2018  . PCO (posterior capsular opacification), bilateral 05/26/2017  . Weakness of right leg 04/20/2017  . 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 Exudative age-related macular degeneration of right eye with active choroidal neovascularization ([place]) 09/30/2016  . Pseudophakia of both eyes 09/30/2016  . Impingement syndrome of right shoulder 06/16/2016  . Impingement syndrome of left shoulder 06/16/2016  . Bilateral leg edema 06/14/2016  . Dizziness and giddiness 06/26/2015  . Abnormality of gait 06/26/2015  . Anxiety 04/14/2015  . GERD (gastroesophageal reflux disease) 04/03/2015  . OSA (obstructive sleep apnea) 04/03/2015  . HLD (hyperlipidemia) 04/03/2015  . SOB (shortness of breath)   . Left elbow fracture 03/29/2015  . Dizziness 01/01/2015  . LBP (low back pain) 01/01/2015  . Malignant neoplasm of prostate ([place]) 01/01/2015  . BP (high blood pressure) 01/01/2015  . DOE (dyspnea on exertion) 10/26/2013  . Essential hypertension, benign 10/26/2013  . Post-traumatic wound infection 10/17/2012  . Cellulitis 10/17/2012  . Atrial fibrillation ([place]) 10/22/2011  . DVT, lower extremity, distal, chronic ([place]) 10/22/2011  . Pacemaker 10/19/2011  . Prostate cancer ([place]) 10/19/2011  . COPD (chronic obstructive pulmonary disease) with emphysema ([place]) 02/04/2011  . Anemia 12/10/2010   Past Medical History:  Diagnosis Date  . Abnormality of gait    due to low back pain , uses  cane and walker  . Arthritis   . Bilateral lower extremity edema    wears TED hose  . Cancer [facility])    prostate- treated with radiation  . Cardiac pacemaker in situ 10/11/2006   medtronic  . Chronic iron deficiency anemia oncologist/ hematoloist-- dr Alen Blew   treatment IV Iron infusions  . Chronic low back pain   .  CKD (chronic kidney disease), stage III   . COPD with emphysema [facility])    pulmologist-  dr Elsworth Soho  . DDD (degenerative disc disease), lumbosacral   . Degenerative scoliosis   . Dyspnea    with exertion  . Full dentures   . GERD (gastroesophageal reflux disease)   . Gross hematuria   . Headache    history of migraines- "way in the past"  . Hereditary and idiopathic peripheral neuropathy    bilateral lower leg  . Hiatal hernia   . History of DVT of lower extremity 10/19/2011   chronic distal popliteal right lower extremity  . History of external beam radiation therapy 2008   prostate cancer  . History of pancreatitis 2013  . History of pneumothorax 09/2006   iatrogenic-- in setting cardiac pacemaker placement  . History of prostate cancer 2008   s/p  external radiation therapy  . Hypertension   . Lesion  of bladder   . Macular degeneration of right eye   . Mild mitral regurgitation   . Mild pulmonary hypertension ([place])   . Nocturia   . OSA on CPAP   . PAF (paroxysmal atrial fibrillation) ([place]) 10/2011   1st episode documented 09/ 2013 admission for gallstons/ pancreatitis and prior to cholecystectomy , went back in NSR without interventeion  . Presence of permanent cardiac pacemaker   . Sinus node dysfunction ([facility])    s/p  cardiac pacemaker placement 10-11-2006  . Wears glasses   . Wears hearing aid in both ears     Family History  Problem Relation Age of Onset  . Heart disease Father   . Heart disease Mother   . Colon cancer Mother   . Stroke Mother   . Cancer Sister   . Heart disease Sister   . Heart attack Neg Hx   . Hypertension Neg Hx     Past Surgical History:  Procedure Laterality Date  . BALLOON DILATION N/A 01/14/2015   Procedure: BALLOON DILATION;  Surgeon: Garlan Fair, MD;  Location: [facility];  Service: Endoscopy;  Laterality: N/A;  . BALLOON DILATION N/A 10/12/2017   Procedure: BALLOON DILATION;  Surgeon: Otis Brace, MD;  Location: [facility]  ENDOSCOPY;  Service: Gastroenterology;  Laterality: N/A;  . BIOPSY  10/12/2017   Procedure: BIOPSY;  Surgeon: Otis Brace, MD;  Location: [facility] ENDOSCOPY;  Service: Gastroenterology;;  . CARDIAC CATHETERIZATION  08-29-2000   [place]   no significant obstructive CAD, intact globle LV size and systolic function with regional wall motion abnormalities noted,  ?coronary spasm producing wall motion abnormality, elevated CPK and chest pain (ef 55%)  . CARDIAC PACEMAKER PLACEMENT  10-11-2006    dr Leonia Reeves   W/  ATRIAL LEAD REVISION 10-12-2006   (medtronic)  . CARDIOVASCULAR STRESS TEST  11-18-2011    dr Irish Lack   Low risk nuclear study w/ no ishemia/  normal wall motion,  post-stress ef 48% (LVEF 56% on prior study 2008)  . CARPAL TUNNEL RELEASE Left 11/14/2014   Procedure: CARPAL TUNNEL RELEASE LEFT THUMB;  Surgeon: Daryll Brod, MD;  Location: [facility];  Service: Orthopedics;  Laterality: Left;  ANESTHESIA:  IV REGIONAL FAB  . CATARACT EXTRACTION W/ INTRAOCULAR LENS  IMPLANT, BILATERAL    . CHOLECYSTECTOMY  10/23/2011  . CHOLECYSTECTOMY  10/23/2011   Procedure: CHOLECYSTECTOMY;  Surgeon: Rolm Bookbinder, MD;  Location: [facility];  Service: General;  Laterality: N/A;  with intraoperative cholangiogram  . COLONOSCOPY    . ESOPHAGOGASTRODUODENOSCOPY (EGD) WITH PROPOFOL N/A 01/14/2015   Procedure: ESOPHAGOGASTRODUODENOSCOPY (EGD) WITH PROPOFOL;  Surgeon: Garlan Fair, MD;  Location[facility];  Service: Endoscopy;  Laterality: N/A;  . ESOPHAGOGASTRODUODENOSCOPY (EGD) WITH PROPOFOL N/A 10/12/2017   Procedure: ESOPHAGOGASTRODUODENOSCOPY (EGD) WITH PROPOFOL;  Surgeon: Otis Brace, MD;  Location: [facility];  Service: Gastroenterology;  Laterality: N/A;  . I&D EXTREMITY Left 10/20/2012   Procedure: LEFT LEG IRRIGATION AND DEBRIDEMENT, AND WOUND VAC APPLICATION;  Surgeon: Marianna Payment, MD;  Location: [facility];  Service: Orthopedics;  Laterality: Left;  . I&D EXTREMITY Left 10/22/2012    Procedure: IRRIGATION AND DEBRIDEMENT EXTREMITY;  Surgeon: Marianna Payment, MD;  Location[facility];  Service: Orthopedics;  Laterality: Left;  . INGUINAL HERNIA REPAIR Left yrs ago  . LAPAROSCOPIC ASSISTED VENTRAL HERNIA REPAIR  08-08-2009   dr Excell Seltzer   AND OPEN REPAIR RECURRENT LEFT INGUINAL HERNIA  . LAPAROSCOPIC INGUINAL HERNIA REPAIR Left 07-24-1999    dr Excell Seltzer  .  OPEN VENTRAL HERNIA REPAIR /  LYSIS ADHESIONS  09-23-2010    dr Donne Hazel  . ORIF ELBOW FRACTURE Left 03/29/2015   Procedure: LEFT ELBOW OPEN REDUCTION INTERNAL FIXATION (ORIF) DISTAL HUMERUS FRACTURE WITH OLECRANON OSTEOTOMY AND ULNAR NERVE RELEASE;  Surgeon: Roseanne Kaufman, MD;  Location: [facility];  Service: Orthopedics;  Laterality: Left;  . PPM GENERATOR CHANGEOUT N/A 02/10/2018   Procedure: PPM GENERATOR CHANGEOUT;  Surgeon: Deboraha Sprang, MD;  Location: [facility] CV LAB;  Service: Cardiovascular;  Laterality: N/A;  . REPAIR SUPRAUMBILICAL HERNIA   [id number]   dr Excell Seltzer  . SHOULDER ARTHROSCOPY WITH DISTAL CLAVICLE RESECTION Right 11-07-2007   dr Rhona Raider   [facility] ACROMIOPLASTY  . SKIN SPLIT GRAFT Left 10/22/2012   Procedure: SKIN GRAFT SPLIT THICKNESS;  Surgeon: Marianna Payment, MD;  Location[facility];  Service: Orthopedics;  Laterality: Left;  . TOTAL KNEE ARTHROPLASTY Right 1990's  . TRANSTHORACIC ECHOCARDIOGRAM  06-12-2012   dr Irish Lack   ef 50-55%/ mild LAE/ mild MR and TR/  AV sclerosis without stenosis/  RVSP 40mmHg  . TRANSURETHRAL RESECTION OF BLADDER TUMOR N/A 07/15/2017   Procedure: CYSTOSCOPY TRANSURETHRAL RESECTION OF BLADDER TUMOR (TURBT);  Surgeon: Kathie Rhodes, MD;  Location[facility];  Service: Urology;  Laterality: N/A;   Social History   Occupational History  . 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 Smoking status: Former Smoker    Packs/day: 1.50    Years: 35.00    Pack years: 52.50    Types:  Cigarettes    Quit date: 02/15/1973    Years since quitting: 45.9  . Smokeless tobacco: Never Used  Substance and Sexual Activity  . Alcohol use: No  . Drug use: No  . Sexual activity: Never

## 2019-02-20 ENCOUNTER — Ambulatory Visit: Payer: Medicare Other | Admitting: Interventional Cardiology

## 2019-02-23 ENCOUNTER — Ambulatory Visit (INDEPENDENT_AMBULATORY_CARE_PROVIDER_SITE_OTHER): Payer: Medicare Other | Admitting: *Deleted

## 2019-02-23 ENCOUNTER — Telehealth: Payer: Self-pay | Admitting: *Deleted

## 2019-02-23 DIAGNOSIS — I480 Paroxysmal atrial fibrillation: Secondary | ICD-10-CM | POA: Diagnosis not present

## 2019-02-23 LAB — CUP PACEART REMOTE DEVICE CHECK
Battery Remaining Longevity: 156 mo
Battery Voltage: 3.06 V
Brady Statistic AP VP Percent: 1.22 %
Brady Statistic AP VS Percent: 62.33 %
Brady Statistic AS VP Percent: 0.15 %
Brady Statistic AS VS Percent: 36.44 %
Brady Statistic RA Percent Paced: 24.66 %
Brady Statistic RV Percent Paced: 3.94 %
Date Time Interrogation Session: 20210108034151
Implantable Lead Implant Date: 20080826
Implantable Lead Implant Date: 20080826
Implantable Lead Location: 753859
Implantable Lead Location: 753860
Implantable Lead Model: 5076
Implantable Lead Model: 5076
Implantable Pulse Generator Implant Date: 20191227
Lead Channel Impedance Value: 285 Ohm
Lead Channel Impedance Value: 304 Ohm
Lead Channel Impedance Value: 399 Ohm
Lead Channel Impedance Value: 437 Ohm
Lead Channel Pacing Threshold Amplitude: 0.875 V
Lead Channel Pacing Threshold Amplitude: 1.375 V
Lead Channel Pacing Threshold Pulse Width: 0.4 ms
Lead Channel Pacing Threshold Pulse Width: 0.4 ms
Lead Channel Sensing Intrinsic Amplitude: 0.5 mV
Lead Channel Sensing Intrinsic Amplitude: 0.5 mV
Lead Channel Sensing Intrinsic Amplitude: 2.875 mV
Lead Channel Sensing Intrinsic Amplitude: 2.875 mV
Lead Channel Setting Pacing Amplitude: 1.75 V
Lead Channel Setting Pacing Amplitude: 2.75 V
Lead Channel Setting Pacing Pulse Width: 0.4 ms
Lead Channel Setting Sensing Sensitivity: 1.2 mV

## 2019-02-23 NOTE — Telephone Encounter (Signed)
Scheduled PPM transmission from 02/23/19 reveals patient has been in persistent AF since mid-November 2020. V rates during AT/AF poorly controlled at times.  Spoke with patient. Confirmed he is taking ASA 81mg  every other day, has previously declined [place]. He reports that he has not had any chest discomfort or palpitations. He does experience SOB with exertion, reports it is hard to tell if this is any worse than it was prior to November. Advised I will forward message to Dr. Caryl Comes and Dr. Irish Lack for review and recommendations. ED precautions reviewed for any worsening symptoms over the weekend. Pt in agreement with plan, no further questions at this time.

## 2019-02-26 NOTE — Telephone Encounter (Signed)
Based on CHADS score, anticoagulation indicated.  He has declined in the past.  Would discuss again with him, can do virtual visit, or Dr. Caryl Comes can discuss with him.  JV

## 2019-02-26 NOTE — Telephone Encounter (Signed)
Called and spoke to patient's wife (DPR on file) and made her aware that Dr. Irish Lack would like to set up a virtual visit with the patient to discuss anticoagulation with the patient. Appointment made for PHONE VISIT on 1/13 at 1:20 PM.     Virtual Visit Pre-Appointment Phone Call  TELEPHONE CALL NOTE  [name] [name] has been deemed a candidate for a follow-up tele-health visit to limit community exposure during the Covid-19 pandemic. I spoke with the patient via phone to ensure availability of phone/video source, confirm preferred email & phone number, and discuss instructions and expectations.  I reminded [name] [name] to be prepared with any vital sign and/or heart rhythm information that could potentially be obtained via home monitoring, at the time of his visit. I reminded [name] [name] to expect a phone call prior to his visit.  Patient's wife agrees to consent below.  [name], RN 02/26/2019 9:12 AM   FULL LENGTH CONSENT FOR TELE-HEALTH VISIT   I hereby voluntarily request, consent and authorize [facility] and its employed or contracted physicians, physician assistants, nurse practitioners or other licensed health care professionals (the Practitioner), to provide me with telemedicine health care services (the "Services") as deemed necessary by the treating Practitioner. I acknowledge and consent to receive the Services by the Practitioner via telemedicine. I understand that the telemedicine visit will involve communicating with the Practitioner through live audiovisual communication technology and the disclosure of certain medical information by electronic transmission. I acknowledge that I have been given the opportunity to request an in-person assessment or other available alternative prior to the telemedicine visit and am voluntarily participating in the telemedicine visit.  I understand that I have the right to withhold or withdraw my consent to the use of  telemedicine in the course of my care at any time, without affecting my right to future care or treatment, and that the Practitioner or I may terminate the telemedicine visit at any time. I understand that I have the right to inspect all information obtained and/or recorded in the course of the telemedicine visit and may receive copies of available information for a reasonable fee.  I understand that some of the potential risks of receiving the Services via telemedicine include:  Marland Kitchen Delay or interruption in medical evaluation due to technological equipment failure or disruption; . Information transmitted may not be sufficient (e.g. poor resolution of images) to allow for appropriate medical decision making by the Practitioner; and/or  . In rare instances, security protocols could fail, causing a breach of personal health information.  Furthermore, I acknowledge that it is my responsibility to provide information about my medical history, conditions and care that is complete and accurate to the best of my ability. I acknowledge that Practitioner's advice, recommendations, and/or decision may be based on factors not within their control, such as incomplete or inaccurate data provided by me or distortions of diagnostic images or specimens that may result from electronic transmissions. I understand that the practice of medicine is not an exact science and that Practitioner makes no warranties or guarantees regarding treatment outcomes. I acknowledge that I will receive a copy of this consent concurrently upon execution via email to the email address I last provided but may also request a printed copy by calling the office of [facility].    I understand that my insurance will be billed for this visit.   I have read or had this consent read to me. . I understand the  contents of this consent, which adequately explains the benefits and risks of the Services being provided via telemedicine.  . I have been  provided ample opportunity to ask questions regarding this consent and the Services and have had my questions answered to my satisfaction. . I give my informed consent for the services to be provided through the use of telemedicine in my medical care  By participating in this telemedicine visit I agree to the above.

## 2019-02-26 NOTE — Progress Notes (Signed)
Virtual Visit via Telephone Note   This visit type was conducted due to national recommendations for restrictions regarding the COVID-19 Pandemic (e.g. social distancing) in an effort to limit this patient's exposure and mitigate transmission in our community.  Due to his co-morbid illnesses, this patient is at least at moderate risk for complications without adequate follow up.  This format is felt to be most appropriate for this patient at this time.  The patient did not have access to video technology/had technical difficulties with video requiring transitioning to audio format only (telephone).  All issues noted in this document were discussed and addressed.  No physical exam could be performed with this format.  Please refer to the patient's chart for his  consent to telehealth for [facility]   Date:  02/28/2019   ID:  [name] [name], DOB October 19, 1924, MRN [id number]  Patient Location: Home Provider Location: Office  PCP:  Lawerance Cruel, MD  Cardiologist:  Larae Grooms, MD  Electrophysiologist:  Virl Axe, MD   Evaluation Performed:  Follow-Up Visit  Chief Complaint:  AFib  History of Present Illness:    [name] [name] is a 86 y.o. male with  who had gallstone pancreatitis and subsequent cholecystectomy in 2013. He has a h/o PAF and HTN.  He has had chronic leg pain- neuropathy.Also has decreased balance.  His son recently had a STEMI when I was on call in 2019.  He was medically managed.  THe patient has tried hard to avoid falls.    The patient does not have symptoms concerning for COVID-19 infection (fever, chills, cough, or new shortness of breath).   Since the last visit, pacer check found more AFib.    Past Medical History:  Diagnosis Date  . Abnormality of gait    due to low back pain , uses  cane and walker  . Arthritis   . Bilateral lower extremity edema    wears TED hose  . Cancer [facility])    prostate- treated with radiation  .  Cardiac pacemaker in situ 10/11/2006   medtronic  . Chronic iron deficiency anemia oncologist/ hematoloist-- dr Alen Blew   treatment IV Iron infusions  . Chronic low back pain   . CKD (chronic kidney disease), stage III   . COPD with emphysema [facility])    pulmologist-  dr Elsworth Soho  . DDD (degenerative disc disease), lumbosacral   . Degenerative scoliosis   . Dyspnea    with exertion  . Full dentures   . GERD (gastroesophageal reflux disease)   . Gross hematuria   . Headache    history of migraines- "way in the past"  . Hereditary and idiopathic peripheral neuropathy    bilateral lower leg  . Hiatal hernia   . History of DVT of lower extremity 10/19/2011   chronic distal popliteal right lower extremity  . History of external beam radiation therapy 2008   prostate cancer  . History of pancreatitis 2013  . History of pneumothorax 09/2006   iatrogenic-- in setting cardiac pacemaker placement  . History of prostate cancer 2008   s/p  external radiation therapy  . Hypertension   . Lesion of bladder   . Macular degeneration of right eye   . Mild mitral regurgitation   . Mild pulmonary hypertension ([place])   . Nocturia   . OSA on CPAP   . PAF (paroxysmal atrial fibrillation) ([place]) 10/2011   1st episode documented 09/ 2013 admission for gallstons/ pancreatitis and prior to cholecystectomy ,  went back in NSR without interventeion  . Presence of permanent cardiac pacemaker   . Sinus node dysfunction (H[facility])    s/p  cardiac pacemaker placement 10-11-2006  . Wears glasses   . Wears hearing aid in both ears    Past Surgical History:  Procedure Laterality Date  . BALLOON DILATION N/A 01/14/2015   Procedure: BALLOON DILATION;  Surgeon: Garlan Fair, MD;  Location: [facility];  Service: Endoscopy;  Laterality: N/A;  . BALLOON DILATION N/A 10/12/2017   Procedure: BALLOON DILATION;  Surgeon: Otis Brace, MD;  Location: [facility];  Service: Gastroenterology;  Laterality: N/A;  . BIOPSY   10/12/2017   Procedure: BIOPSY;  Surgeon: Otis Brace, MD;  Location: [facility];  Service: Gastroenterology;;  . CARDIAC CATHETERIZATION  08-29-2000   [place]   no significant obstructive CAD, intact globle LV size and systolic function with regional wall motion abnormalities noted,  ?coronary spasm producing wall motion abnormality, elevated CPK and chest pain (ef 55%)  . CARDIAC PACEMAKER PLACEMENT  10-11-2006    dr Leonia Reeves   W/  ATRIAL LEAD REVISION 10-12-2006   (medtronic)  . CARDIOVASCULAR STRESS TEST  11-18-2011    dr Irish Lack   Low risk nuclear study w/ no ishemia/  normal wall motion,  post-stress ef 48% (LVEF 56% on prior study 2008)  . CARPAL TUNNEL RELEASE Left 11/14/2014   Procedure: CARPAL TUNNEL RELEASE LEFT THUMB;  Surgeon: Daryll Brod, MD;  Location: [facility];  Service: Orthopedics;  Laterality: Left;  ANESTHESIA:  IV REGIONAL FAB  . CATARACT EXTRACTION W/ INTRAOCULAR LENS  IMPLANT, BILATERAL    . CHOLECYSTECTOMY  10/23/2011  . CHOLECYSTECTOMY  10/23/2011   Procedure: CHOLECYSTECTOMY;  Surgeon: Rolm Bookbinder, MD;  Location: [facility];  Service: General;  Laterality: N/A;  with intraoperative cholangiogram  . COLONOSCOPY    . ESOPHAGOGASTRODUODENOSCOPY (EGD) WITH PROPOFOL N/A 01/14/2015   Procedure: ESOPHAGOGASTRODUODENOSCOPY (EGD) WITH PROPOFOL;  Surgeon: Garlan Fair, MD;  Location: [facility];  Service: Endoscopy;  Laterality: N/A;  . ESOPHAGOGASTRODUODENOSCOPY (EGD) WITH PROPOFOL N/A 10/12/2017   Procedure: ESOPHAGOGASTRODUODENOSCOPY (EGD) WITH PROPOFOL;  Surgeon: Otis Brace, MD;  Location: [facility];  Service: Gastroenterology;  Laterality: N/A;  . I & D EXTREMITY Left 10/20/2012   Procedure: LEFT LEG IRRIGATION AND DEBRIDEMENT, AND WOUND VAC APPLICATION;  Surgeon: Marianna Payment, MD;  Location: [facility];  Service: Orthopedics;  Laterality: Left;  . I & D EXTREMITY Left 10/22/2012   Procedure: IRRIGATION AND DEBRIDEMENT EXTREMITY;  Surgeon:  Marianna Payment, MD;  Location: [facility];  Service: Orthopedics;  Laterality: Left;  . INGUINAL HERNIA REPAIR Left yrs ago  . LAPAROSCOPIC ASSISTED VENTRAL HERNIA REPAIR  08-08-2009   dr Excell Seltzer   AND OPEN REPAIR RECURRENT LEFT INGUINAL HERNIA  . LAPAROSCOPIC INGUINAL HERNIA REPAIR Left 07-24-1999    dr Excell Seltzer  . OPEN VENTRAL HERNIA REPAIR /  LYSIS ADHESIONS  09-23-2010    dr Donne Hazel  . ORIF ELBOW FRACTURE Left 03/29/2015   Procedure: LEFT ELBOW OPEN REDUCTION INTERNAL FIXATION (ORIF) DISTAL HUMERUS FRACTURE WITH OLECRANON OSTEOTOMY AND ULNAR NERVE RELEASE;  Surgeon: Roseanne Kaufman, MD;  Location: [facility];  Service: Orthopedics;  Laterality: Left;  . PPM GENERATOR CHANGEOUT N/A 02/10/2018   Procedure: PPM GENERATOR CHANGEOUT;  Surgeon: Deboraha Sprang, MD;  Location: [facility];  Service: Cardiovascular;  Laterality: N/A;  . REPAIR SUPRAUMBILICAL HERNIA   [id number]   dr Excell Seltzer  . SHOULDER ARTHROSCOPY WITH DISTAL CLAVICLE RESECTION Right 11-07-2007  dr Rhona Raider   [facility]   W/  DEBRIDEMENT AND ACROMIOPLASTY  . SKIN SPLIT GRAFT Left 10/22/2012   Procedure: SKIN GRAFT SPLIT THICKNESS;  Surgeon: Marianna Payment, MD;  Location: [facility];  Service: Orthopedics;  Laterality: Left;  . TOTAL KNEE ARTHROPLASTY Right 1990's  . TRANSTHORACIC ECHOCARDIOGRAM  06-12-2012   dr Irish Lack   ef 50-55%/ mild LAE/ mild MR and TR/  AV sclerosis without stenosis/  RVSP 1mmHg  . TRANSURETHRAL RESECTION OF BLADDER TUMOR N/A 07/15/2017   Procedure: CYSTOSCOPY TRANSURETHRAL RESECTION OF BLADDER TUMOR (TURBT);  Surgeon: Kathie Rhodes, MD;  Location: [facility];  Service: Urology;  Laterality: N/A;     Current Meds  Medication Sig  . acetaminophen (TYLENOL) 650 MG CR tablet Take 650 mg by mouth 2 (two) times daily.  Marland Kitchen albuterol (PROVENTIL HFA;VENTOLIN HFA) 108 (90 Base) MCG/ACT inhaler Inhale 2 puffs into the lungs every 6 (six) hours as needed for wheezing or shortness of breath.  Marland Kitchen aspirin 81  MG chewable tablet Chew 81 mg by mouth every other day.  . Calcium Carbonate-Vitamin D (CALCIUM-D PO) Take 1 tablet by mouth daily.   . diphenhydrAMINE (BENADRYL) 25 MG tablet Take 25 mg by mouth daily as needed for allergies.   . fluticasone (FLONASE) 50 MCG/ACT nasal spray Place 1 spray into both nostrils daily as needed for allergies or rhinitis.  . furosemide (LASIX) 20 MG tablet Take 20-40 mg by mouth See admin instructions. Alternate taking 20 mg and 40 mg daily  . lisinopril (PRINIVIL,ZESTRIL) 10 MG tablet Take 10 mg by mouth daily.   . methocarbamol (ROBAXIN) 500 MG tablet Take 1 tablet (500 mg total) by mouth every 8 (eight) hours as needed for muscle spasms.  . mirabegron ER (MYRBETRIQ) 50 MG TB24 tablet Take 50 mg by mouth daily.  . Multiple Vitamins-Minerals (PRESERVISION AREDS PO) Take 1 tablet by mouth 2 (two) times daily.  . mupirocin ointment (BACTROBAN) 2 % Apply 1 application topically 3 (three) times daily.  Marland Kitchen oxyCODONE (OXY IR/ROXICODONE) 5 MG immediate release tablet Take 5 mg by mouth every 6 (six) hours as needed for severe pain.  Marland Kitchen oxymetazoline (AFRIN) 0.05 % nasal spray Place 1 spray into both nostrils 2 (two) times daily as needed for congestion.  . pentoxifylline (TRENTAL) 400 MG CR tablet Take 1 tablet (400 mg total) by mouth 3 (three) times daily with meals.  . predniSONE (DELTASONE) 10 MG tablet Take 1 tablet (10 mg total) by mouth every morning.  Marland Kitchen Umeclidinium-Vilanterol (ANORO ELLIPTA) 62.5-25 MCG/INH AEPB Inhale 1 puff into the lungs daily.   . vitamin B-12 (CYANOCOBALAMIN) 500 MCG tablet Take 500 mcg by mouth every Monday, Wednesday, and Friday.      Allergies:   Patient has no known allergies.   Social History   Tobacco Use  . Smoking status: Former Smoker    Packs/day: 1.50    Years: 35.00    Pack years: 52.50    Types: Cigarettes    Quit date: 02/15/1973    Years since quitting: 46.0  . Smokeless tobacco: Never Used  Substance Use Topics  . Alcohol  use: No  . Drug use: No     Family Hx: The patient's family history includes Cancer in his sister; Colon cancer in his mother; Heart disease in his father, mother, and sister; Stroke in his mother. There is no history of Heart attack or Hypertension.  ROS:   Please see the history of present illness.  No recent bleeding problems.  All other systems reviewed and are negative.   Prior CV studies:   The following studies were reviewed today:    Labs/Other Tests and Data Reviewed:    EKG:  No ECG reviewed.  Recent Labs: 12/11/2018: BUN 31; Creatinine, Ser 1.49; Hemoglobin 10.0; Magnesium 1.8; Platelets 160; Potassium 4.4; Sodium 142; TSH 1.090   Recent Lipid Panel No results found for: CHOL, TRIG, HDL, CHOLHDL, LDLCALC, LDLDIRECT  Wt Readings from Last 3 Encounters:  02/28/19 160 lb (72.6 kg)  01/23/19 158 lb (71.7 kg)  01/16/19 158 lb (71.7 kg)     Objective:    Vital Signs:  BP 112/84   Pulse (!) 101   Wt 160 lb (72.6 kg)   BMI 23.63 kg/m    VITAL SIGNS:  reviewed GEN:  no acute distress RESPIRATORY:  no shortness of breath NEURO:  alert and oriented x 3, no obvious focal deficit PSYCH:  normal affect exam limited by phone format  ASSESSMENT & PLAN:    1. AFib: Patient is agreeable to starting a blood thinner.  Will check with PharmD re: Eliquis dosing; 5 mg BID.  COuld change based on next week's blood test.  Stop aspirin.  He is agreeable to DCCV in 4 weeks which was plan from Dr. Caryl Comes.  He was scheduled to go to AFib clinic.  I think this can be postponed since we already discussed anticoagulation.  Wife is on blood thinner and had cardioversion in the past. 2. HTN: Hold lisinopril.  Recheck BMet in a week. BPs at home have been well controlled and some borderline low readings with 0000000 systolic. 3. Bladder cancer:  THis was treated with surgery a few years ago. 4. He states that he has blood tests next week with Dr. Harrington Challenger.  We will need CBC and BMet at that  time.  He would prefer to get blood tests at that time.  D/w Dr. Caryl Comes  COVID-19 Education: The signs and symptoms of COVID-19 were discussed with the patient and how to seek care for testing (follow up with PCP or arrange E-visit).  The importance of social distancing was discussed today.  Time:   Today, I have spent 20 minutes with the patient with telehealth technology discussing the above problems.     Medication Adjustments/Labs and Tests Ordered: Current medicines are reviewed at length with the patient today.  Concerns regarding medicines are outlined above.   Tests Ordered: No orders of the defined types were placed in this encounter.   Medication Changes: No orders of the defined types were placed in this encounter.   Follow Up:  In Person in 2 month(s)  Signed, Larae Grooms, MD  02/28/2019 1:36 PM    [place]

## 2019-02-27 NOTE — Telephone Encounter (Signed)
Noted  texted JV to find out in whose court is the next pt contact

## 2019-02-28 ENCOUNTER — Telehealth: Payer: Self-pay

## 2019-02-28 ENCOUNTER — Other Ambulatory Visit: Payer: Self-pay

## 2019-02-28 ENCOUNTER — Encounter: Payer: Self-pay | Admitting: Interventional Cardiology

## 2019-02-28 ENCOUNTER — Telehealth (INDEPENDENT_AMBULATORY_CARE_PROVIDER_SITE_OTHER): Payer: Medicare Other | Admitting: Interventional Cardiology

## 2019-02-28 VITALS — BP 112/84 | HR 101 | Wt 160.0 lb

## 2019-02-28 DIAGNOSIS — I272 Pulmonary hypertension, unspecified: Secondary | ICD-10-CM

## 2019-02-28 DIAGNOSIS — I480 Paroxysmal atrial fibrillation: Secondary | ICD-10-CM | POA: Diagnosis not present

## 2019-02-28 DIAGNOSIS — I739 Peripheral vascular disease, unspecified: Secondary | ICD-10-CM

## 2019-02-28 DIAGNOSIS — I340 Nonrheumatic mitral (valve) insufficiency: Secondary | ICD-10-CM | POA: Diagnosis not present

## 2019-02-28 DIAGNOSIS — Z950 Presence of cardiac pacemaker: Secondary | ICD-10-CM

## 2019-02-28 MED ORDER — APIXABAN 5 MG PO TABS: 5.0000 mg | ORAL_TABLET | Freq: Two times a day (BID) | ORAL | 5 refills | Status: DC

## 2019-02-28 NOTE — Telephone Encounter (Signed)
Spoke with pt per Dr Olin Pia request and advised of referral to Afib clinic for anticoagulation and to discuss possible DCCV.  Pt advised he will be contacted by Afib clinic to schedule appointment.  Pt verbalizes understanding and is agreeable with plan.

## 2019-02-28 NOTE — Telephone Encounter (Signed)
Patient calling back with questions about his medication.

## 2019-02-28 NOTE — Patient Instructions (Signed)
Medication Instructions:  Your physician has recommended you make the following change in your medication:   1. STOP: Lisinopril  2. STOP: Aspirin  3. START: Eliquis 5 mg by mouth twice a day   If you need a refill on your cardiac medications before your next appointment, please call your pharmacy.   Lab work: Please have your Primary Care Doctor draw labs (BMET and CBC) next week and fax the results to [place] at [phone].  If you have labs (blood work) drawn today and your tests are completely normal, you will receive your results only by: Marland Kitchen MyChart Message (if you have MyChart) OR . A paper copy in the mail If you have any lab test that is abnormal or we need to change your treatment, we will call you to review the results.  Testing/Procedures: Your physician has recommended that you have a Cardioversion (DCCV). Electrical Cardioversion uses a jolt of electricity to your heart either through paddles or wired patches attached to your chest. This is a controlled, usually prescheduled, procedure. Defibrillation is done under light anesthesia in the hospital, and you usually go home the day of the procedure. This is done to get your heart back into a normal rhythm. You are not awake for the procedure. Please see the instruction sheet given to you today.  Follow-Up: . Follow up with Dr. Caryl Comes after Cardioversion  Any Other Special Instructions Will Be Listed Below (If Applicable).   CARDIOVERSION INSTRUCTIONS  You are scheduled for a Cardioversion on 04/02/19 with Dr. Johnsie Cancel.  Please arrive at the [facility] (Main Entrance A) at [facility]: [address] [place], [place] [address] at 7:30 am.   DIET: Nothing to eat or drink after midnight except a sip of water with medications (see medication instructions below)  Medication Instructions: Hold furosemide (lasix) the morning of the procedure  Continue your anticoagulant: ELIQUIS You will need to continue your anticoagulant  after your procedure until you are told by your Provider that it is safe to stop   Labs: To be done at [place] office next week. Can repeat the morning of the procedure  Your Pre-procedure COVID-19 Testing will be done on 03/30/19 at 10:30 AM at [facility] at [id number], [place], [place] [address]. Once you arrive at the testing site, stay in the right hand lane, go under the building overhang not the tent. If you are tested under the tent your results may not be back before your procedure. Please be on time for your appointment.  After your swab you will be given a mask to wear and instructed to go home and quarantine/no visitors until after your procedure. If you test positive you will be notified and your procedure will be cancelled.    You must have a responsible person to drive you home and stay in the waiting area during your procedure. Failure to do so could result in cancellation.  Bring your insurance cards.  *Special Note: Every effort is made to have your procedure done on time. Occasionally there are emergencies that occur at the hospital that may cause delays. Please be patient if a delay does occur.

## 2019-02-28 NOTE — Progress Notes (Signed)
Per Dr Olin Pia request pt referred to Afib clinic for anticoagulation and dccv.  Message sent to S. Eulas Post, RN to schedule appointment with pt.

## 2019-03-01 MED ORDER — APIXABAN 5 MG PO TABS: 5.0000 mg | ORAL_TABLET | Freq: Two times a day (BID) | ORAL | 5 refills | Status: DC

## 2019-03-01 NOTE — Addendum Note (Signed)
Addended by: Drue Novel I on: 03/01/2019 07:03 PM   Modules accepted: Orders

## 2019-03-01 NOTE — Telephone Encounter (Signed)
Called and spoke to patient. He states that after he checked with his pharmacy regarding the Eliquis that the cost was too much and would like to have the samples now. 4 week supply of samples for Eliquis 5 mg BID and 30 day free card has been left downstairs for him to pick up in the morning. Reviewed all instructions again regarding med changes and his DCCV instructions. I have printed out his AVS and DCCV instructions and have left them in the sample bag.  Patient is to have his labs checked with PCP next week and they will fax over to [place]. He understands that they may need to draw additional labs the morning of the procedure. Larene Beach in Endo at [facility] states that they will accept a transmission from his PPM instead of an EKG, per Dr. Hassell Done request.  Patient also request that I fax JV's office note and the prescription for Eliquis to Dr. Vonna Drafts at the [place] at [phone]. Will fax notes and Rx. Patient denies any additional questions or concerns at this time.  Per Dr. Irish Lack, patient does not need to be seen in the Afib Clinic on 03/08/19 as he saw him yesterday for his Afib. Patient is aware and appt has been cancelled. He will need to follow up with Dr. Caryl Comes in after cardioversion. Will send a message to his RN and scheduler to make aware.

## 2019-03-02 ENCOUNTER — Encounter: Payer: Self-pay | Admitting: Podiatry

## 2019-03-02 ENCOUNTER — Telehealth: Payer: Self-pay | Admitting: Internal Medicine

## 2019-03-02 ENCOUNTER — Ambulatory Visit (INDEPENDENT_AMBULATORY_CARE_PROVIDER_SITE_OTHER): Payer: Medicare Other | Admitting: Podiatry

## 2019-03-02 ENCOUNTER — Telehealth: Payer: Self-pay | Admitting: Interventional Cardiology

## 2019-03-02 ENCOUNTER — Other Ambulatory Visit: Payer: Self-pay

## 2019-03-02 DIAGNOSIS — M7752 Other enthesopathy of left foot: Secondary | ICD-10-CM | POA: Diagnosis not present

## 2019-03-02 DIAGNOSIS — L600 Ingrowing nail: Secondary | ICD-10-CM

## 2019-03-02 DIAGNOSIS — M779 Enthesopathy, unspecified: Secondary | ICD-10-CM

## 2019-03-02 NOTE — Telephone Encounter (Signed)
Mailed patient information about eliquis.

## 2019-03-02 NOTE — Telephone Encounter (Signed)
Patient would like Dr. Hassell Done nurse to send him instructions on new medication Eliquis (per what they discussed), along with a DPR form for him to fill out. Patient wants his son (lives in [place] and is a doctor too) to be able to call and talk to Dr. Irish Lack about what is going on with him. Informed patient that I would send a message to Dr. Hassell Done nurse to mail this out to him.

## 2019-03-02 NOTE — Progress Notes (Signed)
Subjective:   Patient ID: [name] [name], male   DOB: 86 y.o.   MRN: [id number]   HPI Patient presents with several problems with 1 being inflammation of the big toe joint right another being breakdown of tissue on the distal medial aspect right hallux and ingrown toenail deformity bilateral   ROS      Objective:  Physical Exam  Neurovascular status is found to be diminished but not changed from previous with patient having a lot of stress on her feet in general and having chronic reactivity secondary to both arterial and venous disease.  Patient has incurvated nailbeds that become irritated and has inflammation of the right first MPJ that is sore when palpated     Assessment:  Inflammatory hallux limitus capsulitis first MPJ right along with mild breakdown of tissue distal medial right hallux that does not appear to be showing subcutaneous exposure and nail disease bilateral with ingrown toenails      Plan:  H&P conditions reviewed and at this point I did do sterile prep and injected the first MPJ right 2 mg Dexasone Kenalog 5 mg Xylocaine I then debrided out the nail borders bilateral and for the lesion on the right I recommended padding soaks and medication which was applied today and he will continue Medihoney at home.  Patient will be seen back as needed

## 2019-03-02 NOTE — Telephone Encounter (Signed)
New message   Pt states that his son, who is a doctor in [place], would like to speak with Dr. Caryl Comes directly about the patients afib. Not sure how this would be done, informed pt I would send a message a RN and someone would give him a call.

## 2019-03-02 NOTE — Telephone Encounter (Signed)
Patient calling stating he was supposed to have instructions on the medication samples that he received in the mail. He would like a call back on how to take the medication.

## 2019-03-05 ENCOUNTER — Telehealth: Payer: Self-pay | Admitting: *Deleted

## 2019-03-05 NOTE — Telephone Encounter (Signed)
Spoke with pt who gives verbal permission for Dr Caryl Comes to speak with his so, Dr Phylis Bougie [phone]) who resides in [place].  Pt verbalizes understanding he will need to add this son to [facility] the next time he is in the office.

## 2019-03-05 NOTE — Telephone Encounter (Signed)
Spoke with patient. He is aware of scheduled automatic transmission on 03/30/19 to confirm still in AF prior to [place] on 04/02/19. No additional questions or concerns at this time.

## 2019-03-06 ENCOUNTER — Telehealth: Payer: Self-pay | Admitting: *Deleted

## 2019-03-06 ENCOUNTER — Telehealth: Payer: Self-pay | Admitting: Internal Medicine

## 2019-03-06 NOTE — Telephone Encounter (Signed)
Pt states he has misplaced his summary and it stated something concerning blood work from his PCP, and he would like a call and to be mailed.

## 2019-03-06 NOTE — Telephone Encounter (Signed)
New message   Pt calling asking for a recommendation for a urologist. He asked for a call back.

## 2019-03-06 NOTE — Telephone Encounter (Signed)
I reviewed pt's 03/02/2019 clinical note and after-visit summary, and did not see any orders for labs. I informed pt and checked lab orders, but none were ordered. Mailed the After-visit summary to pt.

## 2019-03-06 NOTE — Telephone Encounter (Signed)
Attempted phone call to pt.  Line is busy. °

## 2019-03-07 DIAGNOSIS — R682 Dry mouth, unspecified: Secondary | ICD-10-CM | POA: Diagnosis not present

## 2019-03-07 DIAGNOSIS — N1831 Chronic kidney disease, stage 3a: Secondary | ICD-10-CM | POA: Diagnosis not present

## 2019-03-07 DIAGNOSIS — R609 Edema, unspecified: Secondary | ICD-10-CM | POA: Diagnosis not present

## 2019-03-07 DIAGNOSIS — R634 Abnormal weight loss: Secondary | ICD-10-CM | POA: Diagnosis not present

## 2019-03-07 DIAGNOSIS — D539 Nutritional anemia, unspecified: Secondary | ICD-10-CM | POA: Diagnosis not present

## 2019-03-08 ENCOUNTER — Ambulatory Visit (HOSPITAL_COMMUNITY): Payer: Medicare Other | Admitting: Physician Assistant

## 2019-03-08 ENCOUNTER — Telehealth: Payer: Self-pay | Admitting: Interventional Cardiology

## 2019-03-08 NOTE — Telephone Encounter (Signed)
Spoke with patient. He wants me to mail him a paper copy of the medications that he should avoid while taking eliquis. Information mailed to patient.

## 2019-03-08 NOTE — Telephone Encounter (Signed)
Patient called and wanted to speak with Dr. Irish Lack or his Nurse. Please call back

## 2019-03-09 DIAGNOSIS — D539 Nutritional anemia, unspecified: Secondary | ICD-10-CM | POA: Diagnosis not present

## 2019-03-09 DIAGNOSIS — N1831 Chronic kidney disease, stage 3a: Secondary | ICD-10-CM | POA: Diagnosis not present

## 2019-03-09 DIAGNOSIS — R634 Abnormal weight loss: Secondary | ICD-10-CM | POA: Diagnosis not present

## 2019-03-09 DIAGNOSIS — R609 Edema, unspecified: Secondary | ICD-10-CM | POA: Diagnosis not present

## 2019-03-09 DIAGNOSIS — R682 Dry mouth, unspecified: Secondary | ICD-10-CM | POA: Diagnosis not present

## 2019-03-09 NOTE — Telephone Encounter (Signed)
Spoke with pt re: Urology recommendation.  Pt states they have found a Urology practice and thanked RN for call back.

## 2019-03-13 ENCOUNTER — Encounter: Payer: Self-pay | Admitting: Orthopedic Surgery

## 2019-03-13 ENCOUNTER — Ambulatory Visit (INDEPENDENT_AMBULATORY_CARE_PROVIDER_SITE_OTHER): Payer: Medicare Other | Admitting: Orthopedic Surgery

## 2019-03-13 ENCOUNTER — Other Ambulatory Visit: Payer: Self-pay

## 2019-03-13 VITALS — Ht 69.0 in | Wt 160.0 lb

## 2019-03-13 DIAGNOSIS — L97919 Non-pressure chronic ulcer of unspecified part of right lower leg with unspecified severity: Secondary | ICD-10-CM | POA: Diagnosis not present

## 2019-03-13 DIAGNOSIS — M7541 Impingement syndrome of right shoulder: Secondary | ICD-10-CM | POA: Diagnosis not present

## 2019-03-13 DIAGNOSIS — I87333 Chronic venous hypertension (idiopathic) with ulcer and inflammation of bilateral lower extremity: Secondary | ICD-10-CM | POA: Diagnosis not present

## 2019-03-13 DIAGNOSIS — L97929 Non-pressure chronic ulcer of unspecified part of left lower leg with unspecified severity: Secondary | ICD-10-CM

## 2019-03-14 ENCOUNTER — Encounter: Payer: Self-pay | Admitting: Orthopedic Surgery

## 2019-03-14 NOTE — Progress Notes (Signed)
Office Visit Note   Patient: [name] [name]           Date of Birth: Jul 06, 1924           MRN: [id number] Visit Date: 03/13/2019              Requested by: Lawerance Cruel, [place],  [place] [id number] PCP: Lawerance Cruel, MD  Chief Complaint  Patient presents with  . Right Leg - Follow-up  . Left Leg - Follow-up  . Right Shoulder - Follow-up      HPI: Patient is a 86 year old gentleman who presents for multiple problems.  Patient complains of increasing swelling in both legs.  He states he is having chronic lower back pain which he is not getting sufficient relief with his oxycodone he states he has had no relief with epidural steroid injections with Dr. Ernestina Patches.  He states he is currently being treated at the pain clinic with Dr. Hardin Negus but states he needs stronger narcotics.  He states his Lasix was increased today.  Patient complains of ulcers on both lower extremities.  Assessment & Plan: Visit Diagnoses:  1. Impingement syndrome of right shoulder   2. Idiopathic chronic venous hypertension of both lower extremities with ulcer and inflammation (HCC)     Plan: Recommended he follow-up with Dr. Hardin Negus to see if a higher dose of oxycodone would be helpful.  Patient's both legs were wrapped due to the significant venous stasis swelling we will follow-up on Friday to change the Dynaflex wraps.  Follow-Up Instructions: Return in about 1 week (around 03/20/2019) for Follow-up Friday and Tuesday for compression wrap changes.   Ortho Exam  Patient is alert, oriented, no adenopathy, well-dressed, normal affect, normal respiratory effort. Examination patient has significant pitting edema in both lower extremities or ulcerations on both legs.  Patient has a negative straight leg raise bilaterally no focal motor weakness no clinical signs of radicular pain he states he does have persistent lower back pain.  There is oozing edema from his legs.  There is no  cellulitis the calf is nontender no clinical signs of a DVT.  Imaging: No results found. No images are attached to the encounter.  Labs: Lab Results  Component Value Date   CRP 16.2 (H) 10/20/2011   LABURIC 4.7 10/17/2006   REPTSTATUS 03/31/2015 FINAL 03/30/2015   GRAMSTAIN Abundant 12/18/2015   GRAMSTAIN WBC present-predominately PMN 12/18/2015   GRAMSTAIN No Squamous Epithelial Cells Seen 12/18/2015   GRAMSTAIN No Organisms Seen 12/18/2015   CULT 9,000 COLONIES/mL INSIGNIFICANT GROWTH 03/30/2015   LABORGA NORMAL SKIN FLORA 12/18/2015     Lab Results  Component Value Date   ALBUMIN 4.1 05/18/2016   ALBUMIN 3.2 (L) 10/17/2012   ALBUMIN 1.9 (L) 10/24/2011   LABURIC 4.7 10/17/2006    Lab Results  Component Value Date   MG 1.8 12/11/2018   MG 1.8 08/06/2015   MG 1.7 10/20/2011   No results found for: VD25OH  No results found for: PREALBUMIN CBC EXTENDED Latest Ref Rng & Units 12/11/2018 02/10/2018 07/15/2017  WBC 3.4 - 10.8 x10E3/uL 5.6 - -  RBC 4.14 - 5.80 x10E6/uL 3.04(L) - -  HGB 13.0 - 17.7 g/dL 10.0(L) 10.9(L) 9.9(L)  HCT 37.5 - 51.0 % 30.8(L) 32.0(L) 29.0(L)  PLT 150 - 450 x10E3/uL 160 - -  NEUTROABS 1.5 - 6.5 K/uL - - -  LYMPHSABS 0.9 - 3.3 K/uL - - -     Body mass index is  23.63 kg/m.  Orders:  No orders of the defined types were placed in this encounter.  No orders of the defined types were placed in this encounter.    Procedures: No procedures performed  Clinical Data: No additional findings.  ROS:  All other systems negative, except as noted in the HPI. Review of Systems  Objective: Vital Signs: Ht 5\' 9"  (1.753 m)   Wt 160 lb (72.6 kg)   BMI 23.63 kg/m   Specialty Comments:  No specialty comments available.  PMFS History: Patient Active Problem List   Diagnosis Date Noted  . Sacral fracture, closed ([place]) 04/13/2018  . PCO (posterior capsular opacification), bilateral 05/26/2017  . Weakness of right leg 04/20/2017  . 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 Exudative age-related macular degeneration of right eye with active choroidal neovascularization ([place]) 09/30/2016  . Pseudophakia of both eyes 09/30/2016  . Impingement syndrome of right shoulder 06/16/2016  . Impingement syndrome of left shoulder 06/16/2016  . Bilateral leg edema 06/14/2016  . Dizziness and giddiness 06/26/2015  . Abnormality of gait 06/26/2015  . Anxiety 04/14/2015  . GERD (gastroesophageal reflux disease) 04/03/2015  . OSA (obstructive sleep apnea) 04/03/2015  . HLD (hyperlipidemia) 04/03/2015  . SOB (shortness of breath)   . Left elbow fracture 03/29/2015  . Dizziness 01/01/2015  . LBP (low back pain) 01/01/2015  . Malignant neoplasm of prostate ([place]) 01/01/2015  . BP (high blood pressure) 01/01/2015  . DOE (dyspnea on exertion) 10/26/2013  . Essential hypertension, benign 10/26/2013  . Post-traumatic wound infection 10/17/2012  . Cellulitis 10/17/2012  . Atrial fibrillation ([place]) 10/22/2011  . DVT, lower extremity, distal, chronic ([place]) 10/22/2011  . Pacemaker 10/19/2011  . Prostate cancer ([place]) 10/19/2011  . COPD (chronic obstructive pulmonary disease) with emphysema ([place]) 02/04/2011  . Anemia 12/10/2010   Past Medical History:  Diagnosis Date  . Abnormality of gait    due to low back pain , uses  cane and walker  . Arthritis   . Bilateral lower extremity edema    wears TED hose  . Cancer [facility])    prostate- treated with radiation  . Cardiac pacemaker in situ 10/11/2006   medtronic  . Chronic iron deficiency anemia oncologist/ hematoloist-- dr Alen Blew   treatment IV Iron infusions  . Chronic low back pain   . CKD (chronic kidney disease), stage III   . COPD with emphysema [facility])    pulmologist-  dr Elsworth Soho  . DDD (degenerative disc disease), lumbosacral   . Degenerative scoliosis   . Dyspnea    with exertion  . Full dentures   . GERD (gastroesophageal reflux disease)   . Gross  hematuria   . Headache    history of migraines- "way in the past"  . Hereditary and idiopathic peripheral neuropathy    bilateral lower leg  . Hiatal hernia   . History of DVT of lower extremity 10/19/2011   chronic distal popliteal right lower extremity  . History of external beam radiation therapy 2008   prostate cancer  . History of pancreatitis 2013  . History of pneumothorax 09/2006   iatrogenic-- in setting cardiac pacemaker placement  . History of prostate cancer 2008   s/p  external radiation therapy  . Hypertension   . Lesion of bladder   . Macular degeneration of right eye   . Mild mitral regurgitation   . Mild pulmonary hypertension ([place])   . Nocturia   . OSA on CPAP   .  PAF (paroxysmal atrial fibrillation) ([place]) 10/2011   1st episode documented 09/ 2013 admission for gallstons/ pancreatitis and prior to cholecystectomy , went back in NSR without interventeion  . Presence of permanent cardiac pacemaker   . Sinus node dysfunction (HCC)    s/p  cardiac pacemaker placement 10-11-2006  . Wears glasses   . Wears hearing aid in both ears     Family History  Problem Relation Age of Onset  . Heart disease Father   . Heart disease Mother   . Colon cancer Mother   . Stroke Mother   . Cancer Sister   . Heart disease Sister   . Heart attack Neg Hx   . Hypertension Neg Hx     Past Surgical History:  Procedure Laterality Date  . BALLOON DILATION N/A 01/14/2015   Procedure: BALLOON DILATION;  Surgeon: Garlan Fair, MD;  Location: [facility];  Service: Endoscopy;  Laterality: N/A;  . BALLOON DILATION N/A 10/12/2017   Procedure: BALLOON DILATION;  Surgeon: Otis Brace, MD;  Location: [facility];  Service: Gastroenterology;  Laterality: N/A;  . BIOPSY  10/12/2017   Procedure: BIOPSY;  Surgeon: Otis Brace, MD;  Location: [facility];  Service: Gastroenterology;;  . CARDIAC CATHETERIZATION  08-29-2000   [place]   no significant obstructive CAD, intact globle  LV size and systolic function with regional wall motion abnormalities noted,  ?coronary spasm producing wall motion abnormality, elevated CPK and chest pain (ef 55%)  . CARDIAC PACEMAKER PLACEMENT  10-11-2006    dr Leonia Reeves   W/  ATRIAL LEAD REVISION 10-12-2006   (medtronic)  . CARDIOVASCULAR STRESS TEST  11-18-2011    dr Irish Lack   Low risk nuclear study w/ no ishemia/  normal wall motion,  post-stress ef 48% (LVEF 56% on prior study 2008)  . CARPAL TUNNEL RELEASE Left 11/14/2014   Procedure: CARPAL TUNNEL RELEASE LEFT THUMB;  Surgeon: Daryll Brod, MD;  Location: [facility];  Service: Orthopedics;  Laterality: Left;  ANESTHESIA:  IV REGIONAL FAB  . CATARACT EXTRACTION W/ INTRAOCULAR LENS  IMPLANT, BILATERAL    . CHOLECYSTECTOMY  10/23/2011  . CHOLECYSTECTOMY  10/23/2011   Procedure: CHOLECYSTECTOMY;  Surgeon: Rolm Bookbinder, MD;  Location: [facility];  Service: General;  Laterality: N/A;  with intraoperative cholangiogram  . COLONOSCOPY    . ESOPHAGOGASTRODUODENOSCOPY (EGD) WITH PROPOFOL N/A 01/14/2015   Procedure: ESOPHAGOGASTRODUODENOSCOPY (EGD) WITH PROPOFOL;  Surgeon: Garlan Fair, MD;  [facility];  Service: Endoscopy;  Laterality: N/A;  . ESOPHAGOGASTRODUODENOSCOPY (EGD) WITH PROPOFOL N/A 10/12/2017   Procedure: ESOPHAGOGASTRODUODENOSCOPY (EGD) WITH PROPOFOL;  Surgeon: Otis Brace, MD;  Location: [place];  Service: Gastroenterology;  Laterality: N/A;  . I & D EXTREMITY Left 10/20/2012   Procedure: LEFT LEG IRRIGATION AND DEBRIDEMENT, AND WOUND VAC APPLICATION;  Surgeon: Marianna Payment, MD;  [facility];  Service: Orthopedics;  Laterality: Left;  . I & D EXTREMITY Left 10/22/2012   Procedure: IRRIGATION AND DEBRIDEMENT EXTREMITY;  Surgeon: Marianna Payment, MD;  [facility];  Service: Orthopedics;  Laterality: Left;  . INGUINAL HERNIA REPAIR Left yrs ago  . LAPAROSCOPIC ASSISTED VENTRAL HERNIA REPAIR  08-08-2009   dr Excell Seltzer   AND OPEN REPAIR  RECURRENT LEFT INGUINAL HERNIA  . LAPAROSCOPIC INGUINAL HERNIA REPAIR Left 07-24-1999    dr Excell Seltzer  . OPEN VENTRAL HERNIA REPAIR /  LYSIS ADHESIONS  09-23-2010    dr Donne Hazel  . ORIF ELBOW FRACTURE Left 03/29/2015   Procedure: LEFT ELBOW OPEN REDUCTION INTERNAL  FIXATION (ORIF) DISTAL HUMERUS FRACTURE WITH OLECRANON OSTEOTOMY AND ULNAR NERVE RELEASE;  Surgeon: Roseanne Kaufman, MD;  Location: [facility];  Service: Orthopedics;  Laterality: Left;  . PPM GENERATOR CHANGEOUT N/A 02/10/2018   Procedure: PPM GENERATOR CHANGEOUT;  Surgeon: Deboraha Sprang, MD;  Location: [facility] [facility];  Service: Cardiovascular;  Laterality: N/A;  . REPAIR SUPRAUMBILICAL HERNIA   [id number]   dr Excell Seltzer  . SHOULDER ARTHROSCOPY WITH DISTAL CLAVICLE RESECTION Right 11-07-2007   dr Rhona Raider   [place] ACROMIOPLASTY  . SKIN SPLIT GRAFT Left 10/22/2012   Procedure: SKIN GRAFT SPLIT THICKNESS;  Surgeon: Marianna Payment, MD;  [facility];  Service: Orthopedics;  Laterality: Left;  . TOTAL KNEE ARTHROPLASTY Right 1990's  . TRANSTHORACIC ECHOCARDIOGRAM  06-12-2012   dr Irish Lack   ef 50-55%/ mild LAE/ mild MR and TR/  AV sclerosis without stenosis/  RVSP 110mmHg  . TRANSURETHRAL RESECTION OF BLADDER TUMOR N/A 07/15/2017   Procedure: CYSTOSCOPY TRANSURETHRAL RESECTION OF BLADDER TUMOR (TURBT);  Surgeon: Kathie Rhodes, MD;  Location: [facility];  Service: Urology;  Laterality: N/A;   Social History   Occupational History  . 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 Smoking status: Former Smoker    Packs/day: 1.50    Years: 35.00    Pack years: 52.50    Types: Cigarettes    Quit date: 02/15/1973    Years since quitting: 46.1  . Smokeless tobacco: Never Used  Substance and Sexual Activity  . Alcohol use: No  . Drug use: No  . Sexual activity: Never

## 2019-03-15 ENCOUNTER — Telehealth: Payer: Self-pay | Admitting: Pharmacist

## 2019-03-15 NOTE — Telephone Encounter (Signed)
BMET checked at PCP's office on 03/09/19 - SCr improved to 1.24 (previously 1.49 when Eliquis 5mg  BID was started on 02/28/19). Pt to continue on current dose of Eliquis 5mg  BID since weight > 60kg and SCr < 1.5. He is being anticoagulated appropriately for upcoming cardioversion on 2/15.

## 2019-03-16 DIAGNOSIS — D539 Nutritional anemia, unspecified: Secondary | ICD-10-CM | POA: Diagnosis not present

## 2019-03-16 DIAGNOSIS — R5383 Other fatigue: Secondary | ICD-10-CM | POA: Diagnosis not present

## 2019-03-16 DIAGNOSIS — Z1211 Encounter for screening for malignant neoplasm of colon: Secondary | ICD-10-CM | POA: Diagnosis not present

## 2019-03-16 DIAGNOSIS — M810 Age-related osteoporosis without current pathological fracture: Secondary | ICD-10-CM | POA: Diagnosis not present

## 2019-03-16 DIAGNOSIS — R634 Abnormal weight loss: Secondary | ICD-10-CM | POA: Diagnosis not present

## 2019-03-16 DIAGNOSIS — N1831 Chronic kidney disease, stage 3a: Secondary | ICD-10-CM | POA: Diagnosis not present

## 2019-03-19 ENCOUNTER — Other Ambulatory Visit: Payer: Self-pay

## 2019-03-19 ENCOUNTER — Telehealth: Payer: Self-pay | Admitting: Interventional Cardiology

## 2019-03-19 ENCOUNTER — Ambulatory Visit (INDEPENDENT_AMBULATORY_CARE_PROVIDER_SITE_OTHER): Payer: Medicare Other | Admitting: Physician Assistant

## 2019-03-19 ENCOUNTER — Encounter: Payer: Self-pay | Admitting: Physician Assistant

## 2019-03-19 VITALS — Ht 69.0 in | Wt 160.0 lb

## 2019-03-19 DIAGNOSIS — I87333 Chronic venous hypertension (idiopathic) with ulcer and inflammation of bilateral lower extremity: Secondary | ICD-10-CM | POA: Diagnosis not present

## 2019-03-19 DIAGNOSIS — L97919 Non-pressure chronic ulcer of unspecified part of right lower leg with unspecified severity: Secondary | ICD-10-CM | POA: Diagnosis not present

## 2019-03-19 DIAGNOSIS — L97929 Non-pressure chronic ulcer of unspecified part of left lower leg with unspecified severity: Secondary | ICD-10-CM | POA: Diagnosis not present

## 2019-03-19 DIAGNOSIS — Z1211 Encounter for screening for malignant neoplasm of colon: Secondary | ICD-10-CM | POA: Diagnosis not present

## 2019-03-19 NOTE — Progress Notes (Signed)
Office Visit Note   Patient: [name] [name]           Date of Birth: March 11, 1924           MRN: [id number] Visit Date: 03/19/2019              Requested by: Lawerance Cruel, [place],  [place] [address] PCP: Lawerance Cruel, MD  Chief Complaint  Patient presents with  . Right Leg - Follow-up  . Left Leg - Follow-up      HPI: This is a pleasant 86 year old gentleman who we follow for bilateral lower venous stasis disease.  He had significant increased swelling and was unable to put his compression socks on.  We did Dynaflex wrap him at his last visit.  He has had adjustments in his Lasix.  And feels he is doing much better  Assessment & Plan: Visit Diagnoses: No diagnosis found.  Plan: He will follow-up later this week.  I asked that he bring his socks with him.  He is still concerned that he will not be able to get the sock on the left ankle.  We will Dynaflex him today with hopes of getting him back in his socks at his next visit  Follow-Up Instructions: No follow-ups on file.   Ortho Exam  Patient is alert, oriented, no adenopathy, well-dressed, normal affect, normal respiratory effort. Focused examination demonstrates small healing superficial ulcer on the medial side of the right calf no cellulitis no drainage no foul odor he has significant decrease in swelling with skin wrinkling   Still some moderate swelling in the left ankle  Imaging: No results found. No images are attached to the encounter.  Labs: Lab Results  Component Value Date   CRP 16.2 (H) 10/20/2011   LABURIC 4.7 10/17/2006   REPTSTATUS 03/31/2015 FINAL 03/30/2015   GRAMSTAIN Abundant 12/18/2015   GRAMSTAIN WBC present-predominately PMN 12/18/2015   GRAMSTAIN No Squamous Epithelial Cells Seen 12/18/2015   GRAMSTAIN No Organisms Seen 12/18/2015   CULT 9,000 COLONIES/mL INSIGNIFICANT GROWTH 03/30/2015   LABORGA NORMAL SKIN FLORA 12/18/2015     Lab Results  Component  Value Date   ALBUMIN 4.1 05/18/2016   ALBUMIN 3.2 (L) 10/17/2012   ALBUMIN 1.9 (L) 10/24/2011   LABURIC 4.7 10/17/2006    Lab Results  Component Value Date   MG 1.8 12/11/2018   MG 1.8 08/06/2015   MG 1.7 10/20/2011   No results found for: VD25OH  No results found for: PREALBUMIN CBC EXTENDED Latest Ref Rng & Units 12/11/2018 02/10/2018 07/15/2017  WBC 3.4 - 10.8 x10E3/uL 5.6 - -  RBC 4.14 - 5.80 x10E6/uL 3.04(L) - -  HGB 13.0 - 17.7 g/dL 10.0(L) 10.9(L) 9.9(L)  HCT 37.5 - 51.0 % 30.8(L) 32.0(L) 29.0(L)  PLT 150 - 450 x10E3/uL 160 - -  NEUTROABS 1.5 - 6.5 K/uL - - -  LYMPHSABS 0.9 - 3.3 K/uL - - -     Body mass index is 23.63 kg/m.  Orders:  No orders of the defined types were placed in this encounter.  No orders of the defined types were placed in this encounter.    Procedures: No procedures performed  Clinical Data: No additional findings.  ROS:  All other systems negative, except as noted in the HPI. Review of Systems  Objective: Vital Signs: Ht 5\' 9"  (1.753 m)   Wt 160 lb (72.6 kg)   BMI 23.63 kg/m   Specialty Comments:  No specialty comments available.  PMFS History: Patient Active Problem List   Diagnosis Date Noted  . Sacral fracture, closed ([place]) 04/13/2018  . PCO (posterior capsular opacification), bilateral 05/26/2017  . Weakness of right leg 04/20/2017  . 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 Exudative age-related macular degeneration of right eye with active choroidal neovascularization ([place]) 09/30/2016  . Pseudophakia of both eyes 09/30/2016  . Impingement syndrome of right shoulder 06/16/2016  . Impingement syndrome of left shoulder 06/16/2016  . Bilateral leg edema 06/14/2016  . Dizziness and giddiness 06/26/2015  . Abnormality of gait 06/26/2015  . Anxiety 04/14/2015  . GERD (gastroesophageal reflux disease) 04/03/2015  . OSA (obstructive sleep apnea) 04/03/2015  . HLD (hyperlipidemia)  04/03/2015  . SOB (shortness of breath)   . Left elbow fracture 03/29/2015  . Dizziness 01/01/2015  . LBP (low back pain) 01/01/2015  . Malignant neoplasm of prostate ([place]) 01/01/2015  . BP (high blood pressure) 01/01/2015  . DOE (dyspnea on exertion) 10/26/2013  . Essential hypertension, benign 10/26/2013  . Post-traumatic wound infection 10/17/2012  . Cellulitis 10/17/2012  . Atrial fibrillation ([place]) 10/22/2011  . DVT, lower extremity, distal, chronic ([place]) 10/22/2011  . Pacemaker 10/19/2011  . Prostate cancer ([place]) 10/19/2011  . COPD (chronic obstructive pulmonary disease) with emphysema ([place]) 02/04/2011  . Anemia 12/10/2010   Past Medical History:  Diagnosis Date  . Abnormality of gait    due to low back pain , uses  cane and walker  . Arthritis   . Bilateral lower extremity edema    wears TED hose  . Cancer [facility])    prostate- treated with radiation  . Cardiac pacemaker in situ 10/11/2006   medtronic  . Chronic iron deficiency anemia oncologist/ hematoloist-- dr Alen Blew   treatment IV Iron infusions  . Chronic low back pain   . CKD (chronic kidney disease), stage III   . COPD with emphysema [facility])    pulmologist-  dr Elsworth Soho  . DDD (degenerative disc disease), lumbosacral   . Degenerative scoliosis   . Dyspnea    with exertion  . Full dentures   . GERD (gastroesophageal reflux disease)   . Gross hematuria   . Headache    history of migraines- "way in the past"  . Hereditary and idiopathic peripheral neuropathy    bilateral lower leg  . Hiatal hernia   . History of DVT of lower extremity 10/19/2011   chronic distal popliteal right lower extremity  . History of external beam radiation therapy 2008   prostate cancer  . History of pancreatitis 2013  . History of pneumothorax 09/2006   iatrogenic-- in setting cardiac pacemaker placement  . History of prostate cancer 2008   s/p  external radiation therapy  . Hypertension   . Lesion of bladder   . Macular  degeneration of right eye   . Mild mitral regurgitation   . Mild pulmonary hypertension ([place])   . Nocturia   . OSA on CPAP   . PAF (paroxysmal atrial fibrillation) ([place]) 10/2011   1st episode documented 09/ 2013 admission for gallstons/ pancreatitis and prior to cholecystectomy , went back in NSR without interventeion  . Presence of permanent cardiac pacemaker   . Sinus node dysfunction (HCC)    s/p  cardiac pacemaker placement 10-11-2006  . Wears glasses   . Wears hearing aid in both ears     Family History  Problem Relation Age of Onset  . Heart disease Father   . Heart  disease Mother   . Colon cancer Mother   . Stroke Mother   . Cancer Sister   . Heart disease Sister   . Heart attack Neg Hx   . Hypertension Neg Hx     Past Surgical History:  Procedure Laterality Date  . BALLOON DILATION N/A 01/14/2015   Procedure: BALLOON DILATION;  Surgeon: Garlan Fair, MD;  [facility];  Service: Endoscopy;  Laterality: N/A;  . BALLOON DILATION N/A 10/12/2017   Procedure: BALLOON DILATION;  Surgeon: Otis Brace, MD;  Location: [facility];  Service: Gastroenterology;  Laterality: N/A;  . BIOPSY  10/12/2017   Procedure: BIOPSY;  Surgeon: Otis Brace, MD;  Location: [facility];  Service: Gastroenterology;;  . CARDIAC CATHETERIZATION  08-29-2000   [place]   no significant obstructive CAD, intact globle LV size and systolic function with regional wall motion abnormalities noted,  ?coronary spasm producing wall motion abnormality, elevated CPK and chest pain (ef 55%)  . CARDIAC PACEMAKER PLACEMENT  10-11-2006    dr Leonia Reeves   W/  ATRIAL LEAD REVISION 10-12-2006   (medtronic)  . CARDIOVASCULAR STRESS TEST  11-18-2011    dr Irish Lack   Low risk nuclear study w/ no ishemia/  normal wall motion,  post-stress ef 48% (LVEF 56% on prior study 2008)  . CARPAL TUNNEL RELEASE Left 11/14/2014   Procedure: CARPAL TUNNEL RELEASE LEFT THUMB;  Surgeon: Daryll Brod, MD;  Location: [facility];  Service: Orthopedics;  Laterality: Left;  ANESTHESIA:  IV REGIONAL FAB  . CATARACT EXTRACTION W/ INTRAOCULAR LENS  IMPLANT, BILATERAL    . CHOLECYSTECTOMY  10/23/2011  . CHOLECYSTECTOMY  10/23/2011   Procedure: CHOLECYSTECTOMY;  Surgeon: Rolm Bookbinder, MD;  Location: [facility] [facility];  Service: General;  Laterality: N/A;  with intraoperative cholangiogram  . COLONOSCOPY    . ESOPHAGOGASTRODUODENOSCOPY (EGD) WITH PROPOFOL N/A 01/14/2015   Procedure: ESOPHAGOGASTRODUODENOSCOPY (EGD) WITH PROPOFOL;  Surgeon: Garlan Fair, MD;  [facility];  Service: Endoscopy;  Laterality: N/A;  . ESOPHAGOGASTRODUODENOSCOPY (EGD) WITH PROPOFOL N/A 10/12/2017   Procedure: ESOPHAGOGASTRODUODENOSCOPY (EGD) WITH PROPOFOL;  Surgeon: Otis Brace, MD;  Location: [place];  Service: Gastroenterology;  Laterality: N/A;  . I & D EXTREMITY Left 10/20/2012   Procedure: LEFT LEG IRRIGATION AND DEBRIDEMENT, AND WOUND VAC APPLICATION;  Surgeon: Marianna Payment, MD;  [facility];  Service: Orthopedics;  Laterality: Left;  . I & D EXTREMITY Left 10/22/2012   Procedure: IRRIGATION AND DEBRIDEMENT EXTREMITY;  Surgeon: Marianna Payment, MD;  [facility];  Service: Orthopedics;  Laterality: Left;  . INGUINAL HERNIA REPAIR Left yrs ago  . LAPAROSCOPIC ASSISTED VENTRAL HERNIA REPAIR  08-08-2009   dr Excell Seltzer   AND OPEN REPAIR RECURRENT LEFT INGUINAL HERNIA  . LAPAROSCOPIC INGUINAL HERNIA REPAIR Left 07-24-1999    dr Excell Seltzer  . OPEN VENTRAL HERNIA REPAIR /  LYSIS ADHESIONS  09-23-2010    dr Donne Hazel  . ORIF ELBOW FRACTURE Left 03/29/2015   Procedure: LEFT ELBOW OPEN REDUCTION INTERNAL FIXATION (ORIF) DISTAL HUMERUS FRACTURE WITH OLECRANON OSTEOTOMY AND ULNAR NERVE RELEASE;  Surgeon: Roseanne Kaufman, MD;  Location: [facility];  Service: Orthopedics;  Laterality: Left;  . PPM GENERATOR CHANGEOUT N/A 02/10/2018   Procedure: PPM GENERATOR CHANGEOUT;  Surgeon: Deboraha Sprang, MD;  Location: [place] [facility];  Service: Cardiovascular;  Laterality: N/A;  . REPAIR SUPRAUMBILICAL HERNIA   [id number]   dr Excell Seltzer  . SHOULDER ARTHROSCOPY WITH DISTAL CLAVICLE RESECTION Right 11-07-2007   dr Rhona Raider  [place]   W/  DEBRIDEMENT AND ACROMIOPLASTY  . SKIN SPLIT GRAFT Left 10/22/2012   Procedure: SKIN GRAFT SPLIT THICKNESS;  Surgeon: Marianna Payment, MD;  [facility];  Service: Orthopedics;  Laterality: Left;  . TOTAL KNEE ARTHROPLASTY Right 1990's  . TRANSTHORACIC ECHOCARDIOGRAM  06-12-2012   dr Irish Lack   ef 50-55%/ mild LAE/ mild MR and TR/  AV sclerosis without stenosis/  RVSP 37mmHg  . TRANSURETHRAL RESECTION OF BLADDER TUMOR N/A 07/15/2017   Procedure: CYSTOSCOPY TRANSURETHRAL RESECTION OF BLADDER TUMOR (TURBT);  Surgeon: Kathie Rhodes, MD;  Location: [facility];  Service: Urology;  Laterality: N/A;   Social History   Occupational History  . 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 Smoking status: Former Smoker    Packs/day: 1.50    Years: 35.00    Pack years: 52.50    Types: Cigarettes    Quit date: 02/15/1973    Years since quitting: 46.1  . Smokeless tobacco: Never Used  Substance and Sexual Activity  . Alcohol use: No  . Drug use: No  . Sexual activity: Never

## 2019-03-19 NOTE — Telephone Encounter (Signed)
Patient requesting to speak with Dr. Hassell Done nurse. States its a long message and complicated when asked what it was in regards to.

## 2019-03-19 NOTE — Telephone Encounter (Signed)
Called and spoke to patient. He is scheduled for DCCV on 2/15 and wants to know if it is okay to get his Covid Vaccine on 2/16. Made patient aware that he can proceed with his vaccine and that he should not miss any Eliquis doses. Patient verbalized understanding and thanked me for the call.

## 2019-03-20 ENCOUNTER — Ambulatory Visit: Payer: Medicare Other | Admitting: Orthopedic Surgery

## 2019-03-22 ENCOUNTER — Ambulatory Visit (INDEPENDENT_AMBULATORY_CARE_PROVIDER_SITE_OTHER): Payer: Medicare Other | Admitting: Physician Assistant

## 2019-03-22 ENCOUNTER — Other Ambulatory Visit: Payer: Self-pay

## 2019-03-22 ENCOUNTER — Encounter: Payer: Self-pay | Admitting: Physician Assistant

## 2019-03-22 VITALS — Ht 69.0 in | Wt 160.0 lb

## 2019-03-22 DIAGNOSIS — L97929 Non-pressure chronic ulcer of unspecified part of left lower leg with unspecified severity: Secondary | ICD-10-CM | POA: Diagnosis not present

## 2019-03-22 DIAGNOSIS — L97919 Non-pressure chronic ulcer of unspecified part of right lower leg with unspecified severity: Secondary | ICD-10-CM | POA: Diagnosis not present

## 2019-03-22 DIAGNOSIS — I87333 Chronic venous hypertension (idiopathic) with ulcer and inflammation of bilateral lower extremity: Secondary | ICD-10-CM

## 2019-03-23 ENCOUNTER — Encounter: Payer: Self-pay | Admitting: Physician Assistant

## 2019-03-23 NOTE — H&P (View-Only) (Signed)
Office Visit Note   Patient: [name] [name]           Date of Birth: 1924/09/16           MRN: [id number] Visit Date: 03/22/2019              Requested by: Lawerance Cruel, [place],  [place] [id number] PCP: Lawerance Cruel, MD  Chief Complaint  Patient presents with  . Left Leg - Follow-up      HPI: This is a pleasant gentleman who was we are following for bilateral lower extremity venous stasis disease.  He had been doing compression wraps.  He was to bring his compression socks today for transfer into the socks.  He actually has placed the socks on himself.  His only concern is he has a blood blister on the outside of his foot that is quite painful to him.  He also has a small callus which he wishes to have debrided  Assessment & Plan: Visit Diagnoses: No diagnosis found.  Plan: He will continue to use his compression socks.  After obtaining verbal consent I did debride the blood blister and got coagulation with a silver nitrate stick.  I applied a Band-Aid with some mupirocin.  I also debrided the callus to healthy soft tissue.  He will follow-up in 2 weeks or sooner if he has any concerns  Follow-Up Instructions: No follow-ups on file.   Ortho Exam  Patient is alert, oriented, no adenopathy, well-dressed, normal affect, normal respiratory effort. Focused examination demonstrates significant decrease in swelling lower extremities no open ulcers.  Swelling is also decreased in his feet.  He does have on the lateral side of his fifth toe a small blood blister.  There is no fluctuance or surrounding erythema or cellulitis.  Also callus beneath the great toe which was debrided.  Also no surrounding cellulitis no fluctuance  Imaging: No results found. No images are attached to the encounter.  Labs: Lab Results  Component Value Date   CRP 16.2 (H) 10/20/2011   LABURIC 4.7 10/17/2006   REPTSTATUS 03/31/2015 FINAL 03/30/2015   GRAMSTAIN Abundant  12/18/2015   GRAMSTAIN WBC present-predominately PMN 12/18/2015   GRAMSTAIN No Squamous Epithelial Cells Seen 12/18/2015   GRAMSTAIN No Organisms Seen 12/18/2015   CULT 9,000 COLONIES/mL INSIGNIFICANT GROWTH 03/30/2015   LABORGA NORMAL SKIN FLORA 12/18/2015     Lab Results  Component Value Date   ALBUMIN 4.1 05/18/2016   ALBUMIN 3.2 (L) 10/17/2012   ALBUMIN 1.9 (L) 10/24/2011   LABURIC 4.7 10/17/2006    Lab Results  Component Value Date   MG 1.8 12/11/2018   MG 1.8 08/06/2015   MG 1.7 10/20/2011   No results found for: VD25OH  No results found for: PREALBUMIN CBC EXTENDED Latest Ref Rng & Units 12/11/2018 02/10/2018 07/15/2017  WBC 3.4 - 10.8 x10E3/uL 5.6 - -  RBC 4.14 - 5.80 x10E6/uL 3.04(L) - -  HGB 13.0 - 17.7 g/dL 10.0(L) 10.9(L) 9.9(L)  HCT 37.5 - 51.0 % 30.8(L) 32.0(L) 29.0(L)  PLT 150 - 450 x10E3/uL 160 - -  NEUTROABS 1.5 - 6.5 K/uL - - -  LYMPHSABS 0.9 - 3.3 K/uL - - -     Body mass index is 23.63 kg/m.  Orders:  No orders of the defined types were placed in this encounter.  No orders of the defined types were placed in this encounter.    Procedures: No procedures performed  Clinical Data: No additional  findings.  ROS:  All other systems negative, except as noted in the HPI. Review of Systems  Objective: Vital Signs: Ht 5\' 9"  (1.753 m)   Wt 160 lb (72.6 kg)   BMI 23.63 kg/m   Specialty Comments:  No specialty comments available.  PMFS History: Patient Active Problem List   Diagnosis Date Noted  . Sacral fracture, closed ([place]) 04/13/2018  . PCO (posterior capsular opacification), bilateral 05/26/2017  . Weakness of right leg 04/20/2017  . 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 Exudative age-related macular degeneration of right eye with active choroidal neovascularization ([place]) 09/30/2016  . Pseudophakia of both eyes 09/30/2016  . Impingement syndrome of right shoulder 06/16/2016  .  Impingement syndrome of left shoulder 06/16/2016  . Bilateral leg edema 06/14/2016  . Dizziness and giddiness 06/26/2015  . Abnormality of gait 06/26/2015  . Anxiety 04/14/2015  . GERD (gastroesophageal reflux disease) 04/03/2015  . OSA (obstructive sleep apnea) 04/03/2015  . HLD (hyperlipidemia) 04/03/2015  . SOB (shortness of breath)   . Left elbow fracture 03/29/2015  . Dizziness 01/01/2015  . LBP (low back pain) 01/01/2015  . Malignant neoplasm of prostate ([place]) 01/01/2015  . BP (high blood pressure) 01/01/2015  . DOE (dyspnea on exertion) 10/26/2013  . Essential hypertension, benign 10/26/2013  . Post-traumatic wound infection 10/17/2012  . Cellulitis 10/17/2012  . Atrial fibrillation ([place]) 10/22/2011  . DVT, lower extremity, distal, chronic ([place]) 10/22/2011  . Pacemaker 10/19/2011  . Prostate cancer ([place]) 10/19/2011  . COPD (chronic obstructive pulmonary disease) with emphysema ([place]) 02/04/2011  . Anemia 12/10/2010   Past Medical History:  Diagnosis Date  . Abnormality of gait    due to low back pain , uses  cane and walker  . Arthritis   . Bilateral lower extremity edema    wears TED hose  . Cancer [facility])    prostate- treated with radiation  . Cardiac pacemaker in situ 10/11/2006   medtronic  . Chronic iron deficiency anemia oncologist/ hematoloist-- dr Alen Blew   treatment IV Iron infusions  . Chronic low back pain   . CKD (chronic kidney disease), stage III   . COPD with emphysema [facility])    pulmologist-  dr Elsworth Soho  . DDD (degenerative disc disease), lumbosacral   . Degenerative scoliosis   . Dyspnea    with exertion  . Full dentures   . GERD (gastroesophageal reflux disease)   . Gross hematuria   . Headache    history of migraines- "way in the past"  . Hereditary and idiopathic peripheral neuropathy    bilateral lower leg  . Hiatal hernia   . History of DVT of lower extremity 10/19/2011   chronic distal popliteal right lower extremity  . History of external  beam radiation therapy 2008   prostate cancer  . History of pancreatitis 2013  . History of pneumothorax 09/2006   iatrogenic-- in setting cardiac pacemaker placement  . History of prostate cancer 2008   s/p  external radiation therapy  . Hypertension   . Lesion of bladder   . Macular degeneration of right eye   . Mild mitral regurgitation   . Mild pulmonary hypertension ([place])   . Nocturia   . OSA on CPAP   . PAF (paroxysmal atrial fibrillation) ([place]) 10/2011   1st episode documented 09/ 2013 admission for gallstons/ pancreatitis and prior to cholecystectomy , went back in NSR without interventeion  . Presence of permanent cardiac pacemaker   .  Sinus node dysfunction (HCC)    s/p  cardiac pacemaker placement 10-11-2006  . Wears glasses   . Wears hearing aid in both ears     Family History  Problem Relation Age of Onset  . Heart disease Father   . Heart disease Mother   . Colon cancer Mother   . Stroke Mother   . Cancer Sister   . Heart disease Sister   . Heart attack Neg Hx   . Hypertension Neg Hx     Past Surgical History:  Procedure Laterality Date  . BALLOON DILATION N/A 01/14/2015   Procedure: BALLOON DILATION;  Surgeon: Garlan Fair, MD;  Location: [facility];  Service: Endoscopy;  Laterality: N/A;  . BALLOON DILATION N/A 10/12/2017   Procedure: BALLOON DILATION;  Surgeon: Otis Brace, MD;  Location: [facility];  Service: Gastroenterology;  Laterality: N/A;  . BIOPSY  10/12/2017   Procedure: BIOPSY;  Surgeon: Otis Brace, MD;  Location: [facility];  Service: Gastroenterology;;  . CARDIAC CATHETERIZATION  08-29-2000   [place]   no significant obstructive CAD, intact globle LV size and systolic function with regional wall motion abnormalities noted,  ?coronary spasm producing wall motion abnormality, elevated CPK and chest pain (ef 55%)  . CARDIAC PACEMAKER PLACEMENT  10-11-2006    dr Leonia Reeves   W/  ATRIAL LEAD REVISION 10-12-2006   (medtronic)  .  CARDIOVASCULAR STRESS TEST  11-18-2011    dr Irish Lack   Low risk nuclear study w/ no ishemia/  normal wall motion,  post-stress ef 48% (LVEF 56% on prior study 2008)  . CARPAL TUNNEL RELEASE Left 11/14/2014   Procedure: CARPAL TUNNEL RELEASE LEFT THUMB;  Surgeon: Daryll Brod, MD;  Location: [facility];  Service: Orthopedics;  Laterality: Left;  ANESTHESIA:  IV REGIONAL FAB  . CATARACT EXTRACTION W/ INTRAOCULAR LENS  IMPLANT, BILATERAL    . CHOLECYSTECTOMY  10/23/2011  . CHOLECYSTECTOMY  10/23/2011   Procedure: CHOLECYSTECTOMY;  Surgeon: Rolm Bookbinder, MD;  Location: [facility];  Service: General;  Laterality: N/A;  with intraoperative cholangiogram  . COLONOSCOPY    . ESOPHAGOGASTRODUODENOSCOPY (EGD) WITH PROPOFOL N/A 01/14/2015   Procedure: ESOPHAGOGASTRODUODENOSCOPY (EGD) WITH PROPOFOL;  Surgeon: Garlan Fair, MD;  [facility];  Service: Endoscopy;  Laterality: N/A;  . ESOPHAGOGASTRODUODENOSCOPY (EGD) WITH PROPOFOL N/A 10/12/2017   Procedure: ESOPHAGOGASTRODUODENOSCOPY (EGD) WITH PROPOFOL;  Surgeon: Otis Brace, MD;  Location: [place];  Service: Gastroenterology;  Laterality: N/A;  . I & D EXTREMITY Left 10/20/2012   Procedure: LEFT LEG IRRIGATION AND DEBRIDEMENT, AND WOUND VAC APPLICATION;  Surgeon: Marianna Payment, MD;  [facility];  Service: Orthopedics;  Laterality: Left;  . I & D EXTREMITY Left 10/22/2012   Procedure: IRRIGATION AND DEBRIDEMENT EXTREMITY;  Surgeon: Marianna Payment, MD;  [facility];  Service: Orthopedics;  Laterality: Left;  . INGUINAL HERNIA REPAIR Left yrs ago  . LAPAROSCOPIC ASSISTED VENTRAL HERNIA REPAIR  08-08-2009   dr Excell Seltzer   AND OPEN REPAIR RECURRENT LEFT INGUINAL HERNIA  . LAPAROSCOPIC INGUINAL HERNIA REPAIR Left 07-24-1999    dr Excell Seltzer  . OPEN VENTRAL HERNIA REPAIR /  LYSIS ADHESIONS  09-23-2010    dr Donne Hazel  . ORIF ELBOW FRACTURE Left 03/29/2015   Procedure: LEFT ELBOW OPEN REDUCTION INTERNAL FIXATION (ORIF)  DISTAL HUMERUS FRACTURE WITH OLECRANON OSTEOTOMY AND ULNAR NERVE RELEASE;  Surgeon: Roseanne Kaufman, MD;  Location: [facility];  Service: Orthopedics;  Laterality: Left;  . PPM GENERATOR CHANGEOUT N/A 02/10/2018   Procedure: PPM  GENERATOR CHANGEOUT;  Surgeon: Deboraha Sprang, MD;  Location: [place] [facility] LAB;  Service: Cardiovascular;  Laterality: N/A;  . REPAIR SUPRAUMBILICAL HERNIA   [id number]   dr Excell Seltzer  . SHOULDER ARTHROSCOPY WITH DISTAL CLAVICLE RESECTION Right 11-07-2007   dr Rhona Raider   [place] ACROMIOPLASTY  . SKIN SPLIT GRAFT Left 10/22/2012   Procedure: SKIN GRAFT SPLIT THICKNESS;  Surgeon: Marianna Payment, MD;  [facility];  Service: Orthopedics;  Laterality: Left;  . TOTAL KNEE ARTHROPLASTY Right 1990's  . TRANSTHORACIC ECHOCARDIOGRAM  06-12-2012   dr Irish Lack   ef 50-55%/ mild LAE/ mild MR and TR/  AV sclerosis without stenosis/  RVSP 66mmHg  . TRANSURETHRAL RESECTION OF BLADDER TUMOR N/A 07/15/2017   Procedure: CYSTOSCOPY TRANSURETHRAL RESECTION OF BLADDER TUMOR (TURBT);  Surgeon: Kathie Rhodes, MD;  Location: [facility];  Service: Urology;  Laterality: N/A;   Social History   Occupational History  . 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 Smoking status: Former Smoker    Packs/day: 1.50    Years: 35.00    Pack years: 52.50    Types: Cigarettes    Quit date: 02/15/1973    Years since quitting: 46.1  . Smokeless tobacco: Never Used  Substance and Sexual Activity  . Alcohol use: No  . Drug use: No  . Sexual activity: Never

## 2019-03-23 NOTE — Progress Notes (Signed)
Office Visit Note   Patient: [name] [name]           Date of Birth: Jul 03, 1924           MRN: [id number] Visit Date: 03/22/2019              Requested by: Lawerance Cruel, [place],  [place] [id number] PCP: Lawerance Cruel, MD  Chief Complaint  Patient presents with  . Left Leg - Follow-up      HPI: This is a pleasant gentleman who was we are following for bilateral lower extremity venous stasis disease.  He had been doing compression wraps.  He was to bring his compression socks today for transfer into the socks.  He actually has placed the socks on himself.  His only concern is he has a blood blister on the outside of his foot that is quite painful to him.  He also has a small callus which he wishes to have debrided  Assessment & Plan: Visit Diagnoses: No diagnosis found.  Plan: He will continue to use his compression socks.  After obtaining verbal consent I did debride the blood blister and got coagulation with a silver nitrate stick.  I applied a Band-Aid with some mupirocin.  I also debrided the callus to healthy soft tissue.  He will follow-up in 2 weeks or sooner if he has any concerns  Follow-Up Instructions: No follow-ups on file.   Ortho Exam  Patient is alert, oriented, no adenopathy, well-dressed, normal affect, normal respiratory effort. Focused examination demonstrates significant decrease in swelling lower extremities no open ulcers.  Swelling is also decreased in his feet.  He does have on the lateral side of his fifth toe a small blood blister.  There is no fluctuance or surrounding erythema or cellulitis.  Also callus beneath the great toe which was debrided.  Also no surrounding cellulitis no fluctuance  Imaging: No results found. No images are attached to the encounter.  Labs: Lab Results  Component Value Date   CRP 16.2 (H) 10/20/2011   LABURIC 4.7 10/17/2006   REPTSTATUS 03/31/2015 FINAL 03/30/2015   GRAMSTAIN Abundant  12/18/2015   GRAMSTAIN WBC present-predominately PMN 12/18/2015   GRAMSTAIN No Squamous Epithelial Cells Seen 12/18/2015   GRAMSTAIN No Organisms Seen 12/18/2015   CULT 9,000 COLONIES/mL INSIGNIFICANT GROWTH 03/30/2015   LABORGA NORMAL SKIN FLORA 12/18/2015     Lab Results  Component Value Date   ALBUMIN 4.1 05/18/2016   ALBUMIN 3.2 (L) 10/17/2012   ALBUMIN 1.9 (L) 10/24/2011   LABURIC 4.7 10/17/2006    Lab Results  Component Value Date   MG 1.8 12/11/2018   MG 1.8 08/06/2015   MG 1.7 10/20/2011   No results found for: VD25OH  No results found for: PREALBUMIN CBC EXTENDED Latest Ref Rng & Units 12/11/2018 02/10/2018 07/15/2017  WBC 3.4 - 10.8 x10E3/uL 5.6 - -  RBC 4.14 - 5.80 x10E6/uL 3.04(L) - -  HGB 13.0 - 17.7 g/dL 10.0(L) 10.9(L) 9.9(L)  HCT 37.5 - 51.0 % 30.8(L) 32.0(L) 29.0(L)  PLT 150 - 450 x10E3/uL 160 - -  NEUTROABS 1.5 - 6.5 K/uL - - -  LYMPHSABS 0.9 - 3.3 K/uL - - -     Body mass index is 23.63 kg/m.  Orders:  No orders of the defined types were placed in this encounter.  No orders of the defined types were placed in this encounter.    Procedures: No procedures performed  Clinical Data: No additional  findings.  ROS:  All other systems negative, except as noted in the HPI. Review of Systems  Objective: Vital Signs: Ht 5\' 9"  (1.753 m)   Wt 160 lb (72.6 kg)   BMI 23.63 kg/m   Specialty Comments:  No specialty comments available.  PMFS History: Patient Active Problem List   Diagnosis Date Noted  . Sacral fracture, closed ([place]) 04/13/2018  . PCO (posterior capsular opacification), bilateral 05/26/2017  . Weakness of right leg 04/20/2017  . 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 Exudative age-related macular degeneration of right eye with active choroidal neovascularization ([place]) 09/30/2016  . Pseudophakia of both eyes 09/30/2016  . Impingement syndrome of right shoulder 06/16/2016  .  Impingement syndrome of left shoulder 06/16/2016  . Bilateral leg edema 06/14/2016  . Dizziness and giddiness 06/26/2015  . Abnormality of gait 06/26/2015  . Anxiety 04/14/2015  . GERD (gastroesophageal reflux disease) 04/03/2015  . OSA (obstructive sleep apnea) 04/03/2015  . HLD (hyperlipidemia) 04/03/2015  . SOB (shortness of breath)   . Left elbow fracture 03/29/2015  . Dizziness 01/01/2015  . LBP (low back pain) 01/01/2015  . Malignant neoplasm of prostate ([place]) 01/01/2015  . BP (high blood pressure) 01/01/2015  . DOE (dyspnea on exertion) 10/26/2013  . Essential hypertension, benign 10/26/2013  . Post-traumatic wound infection 10/17/2012  . Cellulitis 10/17/2012  . Atrial fibrillation ([place]) 10/22/2011  . DVT, lower extremity, distal, chronic ([place]) 10/22/2011  . Pacemaker 10/19/2011  . Prostate cancer ([place]) 10/19/2011  . COPD (chronic obstructive pulmonary disease) with emphysema ([place]) 02/04/2011  . Anemia 12/10/2010   Past Medical History:  Diagnosis Date  . Abnormality of gait    due to low back pain , uses  cane and walker  . Arthritis   . Bilateral lower extremity edema    wears TED hose  . Cancer [facility])    prostate- treated with radiation  . Cardiac pacemaker in situ 10/11/2006   medtronic  . Chronic iron deficiency anemia oncologist/ hematoloist-- dr Alen Blew   treatment IV Iron infusions  . Chronic low back pain   . CKD (chronic kidney disease), stage III   . COPD with emphysema [facility])    pulmologist-  dr Elsworth Soho  . DDD (degenerative disc disease), lumbosacral   . Degenerative scoliosis   . Dyspnea    with exertion  . Full dentures   . GERD (gastroesophageal reflux disease)   . Gross hematuria   . Headache    history of migraines- "way in the past"  . Hereditary and idiopathic peripheral neuropathy    bilateral lower leg  . Hiatal hernia   . History of DVT of lower extremity 10/19/2011   chronic distal popliteal right lower extremity  . History of external  beam radiation therapy 2008   prostate cancer  . History of pancreatitis 2013  . History of pneumothorax 09/2006   iatrogenic-- in setting cardiac pacemaker placement  . History of prostate cancer 2008   s/p  external radiation therapy  . Hypertension   . Lesion of bladder   . Macular degeneration of right eye   . Mild mitral regurgitation   . Mild pulmonary hypertension ([place])   . Nocturia   . OSA on CPAP   . PAF (paroxysmal atrial fibrillation) ([place]) 10/2011   1st episode documented 09/ 2013 admission for gallstons/ pancreatitis and prior to cholecystectomy , went back in NSR without interventeion  . Presence of permanent cardiac pacemaker   .  Sinus node dysfunction (HCC)    s/p  cardiac pacemaker placement 10-11-2006  . Wears glasses   . Wears hearing aid in both ears     Family History  Problem Relation Age of Onset  . Heart disease Father   . Heart disease Mother   . Colon cancer Mother   . Stroke Mother   . Cancer Sister   . Heart disease Sister   . Heart attack Neg Hx   . Hypertension Neg Hx     Past Surgical History:  Procedure Laterality Date  . BALLOON DILATION N/A 01/14/2015   Procedure: BALLOON DILATION;  Surgeon: Garlan Fair, MD;  [facility];  Service: Endoscopy;  Laterality: N/A;  . BALLOON DILATION N/A 10/12/2017   Procedure: BALLOON DILATION;  Surgeon: Otis Brace, MD;  Location: [place] [facility];  Service: Gastroenterology;  Laterality: N/A;  . BIOPSY  10/12/2017   Procedure: BIOPSY;  Surgeon: Otis Brace, MD;  Location: [facility];  Service: Gastroenterology;;  . CARDIAC CATHETERIZATION  08-29-2000   [place]   no significant obstructive CAD, intact globle LV size and systolic function with regional wall motion abnormalities noted,  ?coronary spasm producing wall motion abnormality, elevated CPK and chest pain (ef 55%)  . CARDIAC PACEMAKER PLACEMENT  10-11-2006    dr Leonia Reeves   W/  ATRIAL LEAD REVISION 10-12-2006   (medtronic)  .  CARDIOVASCULAR STRESS TEST  11-18-2011    dr Irish Lack   Low risk nuclear study w/ no ishemia/  normal wall motion,  post-stress ef 48% (LVEF 56% on prior study 2008)  . CARPAL TUNNEL RELEASE Left 11/14/2014   Procedure: CARPAL TUNNEL RELEASE LEFT THUMB;  Surgeon: Daryll Brod, MD;  Location: [facility];  Service: Orthopedics;  Laterality: Left;  ANESTHESIA:  IV REGIONAL FAB  . CATARACT EXTRACTION W/ INTRAOCULAR LENS  IMPLANT, BILATERAL    . CHOLECYSTECTOMY  10/23/2011  . CHOLECYSTECTOMY  10/23/2011   Procedure: CHOLECYSTECTOMY;  Surgeon: Rolm Bookbinder, MD;  Location: [place];  Service: General;  Laterality: N/A;  with intraoperative cholangiogram  . COLONOSCOPY    . ESOPHAGOGASTRODUODENOSCOPY (EGD) WITH PROPOFOL N/A 01/14/2015   Procedure: ESOPHAGOGASTRODUODENOSCOPY (EGD) WITH PROPOFOL;  Surgeon: Garlan Fair, MD;  [facility];  Service: Endoscopy;  Laterality: N/A;  . ESOPHAGOGASTRODUODENOSCOPY (EGD) WITH PROPOFOL N/A 10/12/2017   Procedure: ESOPHAGOGASTRODUODENOSCOPY (EGD) WITH PROPOFOL;  Surgeon: Otis Brace, MD;  Location: [place];  Service: Gastroenterology;  Laterality: N/A;  . I & D EXTREMITY Left 10/20/2012   Procedure: LEFT LEG IRRIGATION AND DEBRIDEMENT, AND WOUND VAC APPLICATION;  Surgeon: Marianna Payment, MD;  [facility];  Service: Orthopedics;  Laterality: Left;  . I & D EXTREMITY Left 10/22/2012   Procedure: IRRIGATION AND DEBRIDEMENT EXTREMITY;  Surgeon: Marianna Payment, MD;  [facility];  Service: Orthopedics;  Laterality: Left;  . INGUINAL HERNIA REPAIR Left yrs ago  . LAPAROSCOPIC ASSISTED VENTRAL HERNIA REPAIR  08-08-2009   dr Excell Seltzer   AND OPEN REPAIR RECURRENT LEFT INGUINAL HERNIA  . LAPAROSCOPIC INGUINAL HERNIA REPAIR Left 07-24-1999    dr Excell Seltzer  . OPEN VENTRAL HERNIA REPAIR /  LYSIS ADHESIONS  09-23-2010    dr Donne Hazel  . ORIF ELBOW FRACTURE Left 03/29/2015   Procedure: LEFT ELBOW OPEN REDUCTION INTERNAL FIXATION (ORIF)  DISTAL HUMERUS FRACTURE WITH OLECRANON OSTEOTOMY AND ULNAR NERVE RELEASE;  Surgeon: Roseanne Kaufman, MD;  Location: [place];  Service: Orthopedics;  Laterality: Left;  . PPM GENERATOR CHANGEOUT N/A 02/10/2018   Procedure: PPM  GENERATOR CHANGEOUT;  Surgeon: Deboraha Sprang, MD;  Location: [place] [facility] LAB;  Service: Cardiovascular;  Laterality: N/A;  . REPAIR SUPRAUMBILICAL HERNIA   [id number]   dr Excell Seltzer  . SHOULDER ARTHROSCOPY WITH DISTAL CLAVICLE RESECTION Right 11-07-2007   dr Rhona Raider   [place] ACROMIOPLASTY  . SKIN SPLIT GRAFT Left 10/22/2012   Procedure: SKIN GRAFT SPLIT THICKNESS;  Surgeon: Marianna Payment, MD;  [facility];  Service: Orthopedics;  Laterality: Left;  . TOTAL KNEE ARTHROPLASTY Right 1990's  . TRANSTHORACIC ECHOCARDIOGRAM  06-12-2012   dr Irish Lack   ef 50-55%/ mild LAE/ mild MR and TR/  AV sclerosis without stenosis/  RVSP 61mmHg  . TRANSURETHRAL RESECTION OF BLADDER TUMOR N/A 07/15/2017   Procedure: CYSTOSCOPY TRANSURETHRAL RESECTION OF BLADDER TUMOR (TURBT);  Surgeon: Kathie Rhodes, MD;  Location: [facility];  Service: Urology;  Laterality: N/A;   Social History   Occupational History  . 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 Smoking status: Former Smoker    Packs/day: 1.50    Years: 35.00    Pack years: 52.50    Types: Cigarettes    Quit date: 02/15/1973    Years since quitting: 46.1  . Smokeless tobacco: Never Used  Substance and Sexual Activity  . Alcohol use: No  . Drug use: No  . Sexual activity: Never

## 2019-03-26 ENCOUNTER — Ambulatory Visit: Payer: Medicare Other | Admitting: Orthopedic Surgery

## 2019-03-30 ENCOUNTER — Other Ambulatory Visit: Payer: Self-pay | Admitting: Interventional Cardiology

## 2019-03-30 ENCOUNTER — Ambulatory Visit (INDEPENDENT_AMBULATORY_CARE_PROVIDER_SITE_OTHER): Payer: Medicare Other | Admitting: *Deleted

## 2019-03-30 ENCOUNTER — Telehealth: Payer: Self-pay | Admitting: *Deleted

## 2019-03-30 ENCOUNTER — Other Ambulatory Visit (HOSPITAL_COMMUNITY)
Admission: RE | Admit: 2019-03-30 | Discharge: 2019-03-30 | Disposition: A | Discharge status: Home / Self Care | Payer: Medicare Other | Source: Ambulatory Visit | Attending: Cardiovascular Disease | Admitting: Cardiovascular Disease

## 2019-03-30 DIAGNOSIS — R634 Abnormal weight loss: Secondary | ICD-10-CM | POA: Diagnosis not present

## 2019-03-30 DIAGNOSIS — I480 Paroxysmal atrial fibrillation: Secondary | ICD-10-CM

## 2019-03-30 DIAGNOSIS — Z20822 Contact with and (suspected) exposure to covid-19: Secondary | ICD-10-CM | POA: Diagnosis not present

## 2019-03-30 DIAGNOSIS — D649 Anemia, unspecified: Secondary | ICD-10-CM | POA: Diagnosis not present

## 2019-03-30 DIAGNOSIS — Z01812 Encounter for preprocedural laboratory examination: Secondary | ICD-10-CM | POA: Insufficient documentation

## 2019-03-30 DIAGNOSIS — I4821 Permanent atrial fibrillation: Secondary | ICD-10-CM

## 2019-03-30 DIAGNOSIS — R195 Other fecal abnormalities: Secondary | ICD-10-CM | POA: Diagnosis not present

## 2019-03-30 LAB — CUP PACEART REMOTE DEVICE CHECK
Battery Remaining Longevity: 156 mo
Battery Voltage: 3.05 V
Brady Statistic AP VP Percent: 7.38 %
Brady Statistic AP VS Percent: 5.19 %
Brady Statistic AS VP Percent: 2.81 %
Brady Statistic AS VS Percent: 95.85 %
Brady Statistic RA Percent Paced: 0.51 %
Brady Statistic RV Percent Paced: 3.11 %
Date Time Interrogation Session: 20210211202218
Implantable Lead Implant Date: 20080826
Implantable Lead Implant Date: 20080826
Implantable Lead Location: 753859
Implantable Lead Location: 753860
Implantable Lead Model: 5076
Implantable Lead Model: 5076
Implantable Pulse Generator Implant Date: 20191227
Lead Channel Impedance Value: 266 Ohm
Lead Channel Impedance Value: 285 Ohm
Lead Channel Impedance Value: 380 Ohm
Lead Channel Impedance Value: 456 Ohm
Lead Channel Pacing Threshold Amplitude: 0.875 V
Lead Channel Pacing Threshold Amplitude: 1.5 V
Lead Channel Pacing Threshold Pulse Width: 0.4 ms
Lead Channel Pacing Threshold Pulse Width: 0.4 ms
Lead Channel Sensing Intrinsic Amplitude: 0.75 mV
Lead Channel Sensing Intrinsic Amplitude: 0.75 mV
Lead Channel Sensing Intrinsic Amplitude: 2.875 mV
Lead Channel Sensing Intrinsic Amplitude: 2.875 mV
Lead Channel Setting Pacing Amplitude: 1.75 V
Lead Channel Setting Pacing Amplitude: 3 V
Lead Channel Setting Pacing Pulse Width: 0.4 ms
Lead Channel Setting Sensing Sensitivity: 1.2 mV

## 2019-03-30 LAB — SARS CORONAVIRUS 2 (TAT 6-24 HRS): SARS Coronavirus 2: NEGATIVE

## 2019-03-30 NOTE — Telephone Encounter (Signed)
Spoke with patient, advised that per 03/29/19 transmission, he is still in persistent AF (pre-DCCV check). Pt verbalizes understanding and is aware of DCCV on 04/02/19.

## 2019-03-30 NOTE — Addendum Note (Signed)
Addended by: Jennette Banker on: 03/30/2019 11:21 AM   Modules accepted: Level of Service

## 2019-03-30 NOTE — Progress Notes (Signed)
Pre-op call complete for procedure on Monday 2/15. Patient states he has been quarantined since COVID test and will continue to be over weekend. Patient states he has not missed any doses of his blood thinner and will take morning of procedure. Patient to arrive by 8am. All questions addressed.

## 2019-03-30 NOTE — Progress Notes (Signed)
PPM Remote  

## 2019-04-02 ENCOUNTER — Encounter (HOSPITAL_COMMUNITY)
Admission: RE | Disposition: A | Discharge status: Home / Self Care | Payer: Self-pay | Source: Home / Self Care | Attending: Cardiovascular Disease

## 2019-04-02 ENCOUNTER — Other Ambulatory Visit: Payer: Self-pay

## 2019-04-02 ENCOUNTER — Ambulatory Visit (HOSPITAL_COMMUNITY): Payer: Medicare Other

## 2019-04-02 ENCOUNTER — Ambulatory Visit (HOSPITAL_COMMUNITY)
Admission: RE | Admit: 2019-04-02 | Discharge: 2019-04-02 | Disposition: A | Discharge status: Home / Self Care | Payer: Medicare Other | Attending: Cardiovascular Disease | Admitting: Cardiovascular Disease

## 2019-04-02 DIAGNOSIS — J449 Chronic obstructive pulmonary disease, unspecified: Secondary | ICD-10-CM | POA: Insufficient documentation

## 2019-04-02 DIAGNOSIS — E785 Hyperlipidemia, unspecified: Secondary | ICD-10-CM | POA: Diagnosis not present

## 2019-04-02 DIAGNOSIS — I10 Essential (primary) hypertension: Secondary | ICD-10-CM | POA: Diagnosis not present

## 2019-04-02 DIAGNOSIS — N183 Chronic kidney disease, stage 3 unspecified: Secondary | ICD-10-CM | POA: Insufficient documentation

## 2019-04-02 DIAGNOSIS — I272 Pulmonary hypertension, unspecified: Secondary | ICD-10-CM | POA: Insufficient documentation

## 2019-04-02 DIAGNOSIS — G4733 Obstructive sleep apnea (adult) (pediatric): Secondary | ICD-10-CM | POA: Insufficient documentation

## 2019-04-02 DIAGNOSIS — Z8546 Personal history of malignant neoplasm of prostate: Secondary | ICD-10-CM | POA: Diagnosis not present

## 2019-04-02 DIAGNOSIS — F419 Anxiety disorder, unspecified: Secondary | ICD-10-CM | POA: Insufficient documentation

## 2019-04-02 DIAGNOSIS — K219 Gastro-esophageal reflux disease without esophagitis: Secondary | ICD-10-CM | POA: Diagnosis not present

## 2019-04-02 DIAGNOSIS — Z87891 Personal history of nicotine dependence: Secondary | ICD-10-CM | POA: Diagnosis not present

## 2019-04-02 DIAGNOSIS — I4891 Unspecified atrial fibrillation: Secondary | ICD-10-CM | POA: Diagnosis not present

## 2019-04-02 DIAGNOSIS — Z7901 Long term (current) use of anticoagulants: Secondary | ICD-10-CM | POA: Diagnosis not present

## 2019-04-02 DIAGNOSIS — Z950 Presence of cardiac pacemaker: Secondary | ICD-10-CM | POA: Diagnosis not present

## 2019-04-02 DIAGNOSIS — I129 Hypertensive chronic kidney disease with stage 1 through stage 4 chronic kidney disease, or unspecified chronic kidney disease: Secondary | ICD-10-CM | POA: Diagnosis not present

## 2019-04-02 DIAGNOSIS — I480 Paroxysmal atrial fibrillation: Secondary | ICD-10-CM | POA: Diagnosis not present

## 2019-04-02 DIAGNOSIS — D509 Iron deficiency anemia, unspecified: Secondary | ICD-10-CM | POA: Diagnosis not present

## 2019-04-02 HISTORY — PX: CARDIOVERSION: SHX1299

## 2019-04-02 LAB — POCT I-STAT, CHEM 8
BUN: 51 mg/dL — ABNORMAL HIGH (ref 8–23)
Calcium, Ion: 1.13 mmol/L — ABNORMAL LOW (ref 1.15–1.40)
Chloride: 105 mmol/L (ref 98–111)
Creatinine, Ser: 1.2 mg/dL (ref 0.61–1.24)
Glucose, Bld: 100 mg/dL — ABNORMAL HIGH (ref 70–99)
HCT: 35 % — ABNORMAL LOW (ref 39.0–52.0)
Hemoglobin: 11.9 g/dL — ABNORMAL LOW (ref 13.0–17.0)
Potassium: 5.2 mmol/L — ABNORMAL HIGH (ref 3.5–5.1)
Sodium: 140 mmol/L (ref 135–145)
TCO2: 28 mmol/L (ref 22–32)

## 2019-04-02 SURGERY — CARDIOVERSION
Anesthesia: General

## 2019-04-02 MED ORDER — BACITRACIN ZINC 500 UNIT/GM EX OINT: TOPICAL_OINTMENT | CUTANEOUS | Status: DC

## 2019-04-02 MED ORDER — LIDOCAINE 2% (20 MG/ML) 5 ML SYRINGE
INTRAMUSCULAR | Status: DC | PRN
  Administered 2019-04-02: 40 mg via INTRAVENOUS

## 2019-04-02 MED ORDER — PROPOFOL 10 MG/ML IV BOLUS
INTRAVENOUS | Status: DC | PRN
  Administered 2019-04-02: 20 mg via INTRAVENOUS
  Administered 2019-04-02: 50 mg via INTRAVENOUS

## 2019-04-02 MED ORDER — SODIUM CHLORIDE 0.9 % IV SOLN: INTRAVENOUS | Status: DC | PRN

## 2019-04-02 NOTE — Discharge Instructions (Signed)
Electrical Cardioversion Electrical cardioversion is the delivery of a jolt of electricity to restore a normal rhythm to the heart. A rhythm that is too fast or is not regular keeps the heart from pumping well. In this procedure, sticky patches or metal paddles are placed on the chest to deliver electricity to the heart from a device. This procedure may be done in an emergency if:  There is low or no blood pressure as a result of the heart rhythm.  Normal rhythm must be restored as fast as possible to protect the brain and heart from further damage.  It may save a life. This may also be a scheduled procedure for irregular or fast heart rhythms that are not immediately life-threatening. Tell a health care provider about:  Any allergies you have.  All medicines you are taking, including vitamins, herbs, eye drops, creams, and over-the-counter medicines.  Any problems you or family members have had with anesthetic medicines.  Any blood disorders you have.  Any surgeries you have had.  Any medical conditions you have.  Whether you are pregnant or may be pregnant. What are the risks? Generally, this is a safe procedure. However, problems may occur, including:  Allergic reactions to medicines.  A blood clot that breaks free and travels to other parts of your body.  The possible return of an abnormal heart rhythm within hours or days after the procedure.  Your heart stopping (cardiac arrest). This is rare. What happens before the procedure? Medicines  Your health care provider may have you start taking: ? Blood-thinning medicines (anticoagulants) so your blood does not clot as easily. ? Medicines to help stabilize your heart rate and rhythm.  Ask your health care provider about: ? Changing or stopping your regular medicines. This is especially important if you are taking diabetes medicines or blood thinners. ? Taking medicines such as aspirin and ibuprofen. These medicines can  thin your blood. Do not take these medicines unless your health care provider tells you to take them. ? Taking over-the-counter medicines, vitamins, herbs, and supplements. General instructions  Follow instructions from your health care provider about eating or drinking restrictions.  Plan to have someone take you home from the hospital or clinic.  If you will be going home right after the procedure, plan to have someone with you for 24 hours.  Ask your health care provider what steps will be taken to help prevent infection. These may include washing your skin with a germ-killing soap. What happens during the procedure?   An IV will be inserted into one of your veins.  Sticky patches (electrodes) or metal paddles may be placed on your chest.  You will be given a medicine to help you relax (sedative).  An electrical shock will be delivered. The procedure may vary among health care providers and hospitals. What can I expect after the procedure?  Your blood pressure, heart rate, breathing rate, and blood oxygen level will be monitored until you leave the hospital or clinic.  Your heart rhythm will be watched to make sure it does not change.  You may have some redness on the skin where the shocks were given. Follow these instructions at home:  Do not drive for 24 hours if you were given a sedative during your procedure.  Take over-the-counter and prescription medicines only as told by your health care provider.  Ask your health care provider how to check your pulse. Check it often.  Rest for 48 hours after the procedure or   as told by your health care provider.  Avoid or limit your caffeine use as told by your health care provider.  Keep all follow-up visits as told by your health care provider. This is important. Contact a health care provider if:  You feel like your heart is beating too quickly or your pulse is not regular.  You have a serious muscle cramp that does not go  away. Get help right away if:  You have discomfort in your chest.  You are dizzy or you feel faint.  You have trouble breathing or you are short of breath.  Your speech is slurred.  You have trouble moving an arm or leg on one side of your body.  Your fingers or toes turn cold or blue. Summary  Electrical cardioversion is the delivery of a jolt of electricity to restore a normal rhythm to the heart.  This procedure may be done right away in an emergency or may be a scheduled procedure if the condition is not an emergency.  Generally, this is a safe procedure.  After the procedure, check your pulse often as told by your health care provider. This information is not intended to replace advice given to you by your health care provider. Make sure you discuss any questions you have with your health care provider. Document Revised: 09/04/2018 Document Reviewed: 09/04/2018 Elsevier Patient Education  2020 [employer]  

## 2019-04-02 NOTE — Anesthesia Procedure Notes (Signed)
Procedure Name: MAC Date/Time: 04/02/2019 9:00 AM Performed by: Amadeo Garnet, CRNA Pre-anesthesia Checklist: Patient identified, Emergency Drugs available, Suction available and Patient being monitored Patient Re-evaluated:Patient Re-evaluated prior to induction Oxygen Delivery Method: Ambu bag Preoxygenation: Pre-oxygenation with 100% oxygen Induction Type: IV induction Placement Confirmation: positive ETCO2 Dental Injury: Teeth and Oropharynx as per pre-operative assessment

## 2019-04-02 NOTE — Anesthesia Preprocedure Evaluation (Addendum)
Anesthesia Evaluation  Patient identified by MRN, date of birth, ID band Patient awake    Reviewed: Allergy & Precautions, NPO status , Patient's Chart, lab work & pertinent test results  History of Anesthesia Complications Negative for: history of anesthetic complications  Airway Mallampati: I  TM Distance: >3 FB Neck ROM: Full    Dental  (+) Edentulous Upper, Edentulous Lower   Pulmonary sleep apnea and Continuous Positive Airway Pressure Ventilation , COPD,  COPD inhaler, former smoker,  03/30/2019 SARS coronavirus NEG   breath sounds clear to auscultation       Cardiovascular hypertension, Pt. on medications (-) angina+ DVT  + dysrhythmias Atrial Fibrillation + pacemaker  Rhythm:Irregular Rate:Tachycardia     Neuro/Psych  Headaches, Anxiety    GI/Hepatic Neg liver ROS, GERD  Medicated and Controlled,  Endo/Other  negative endocrine ROS  Renal/GU Renal InsufficiencyRenal disease     Musculoskeletal   Abdominal   Peds  Hematology eliquis   Anesthesia Other Findings   Reproductive/Obstetrics                            Anesthesia Physical Anesthesia Plan  ASA: III  Anesthesia Plan: General   Post-op Pain Management:    Induction: Intravenous  PONV Risk Score and Plan: 2 and Treatment may vary due to age or medical condition  Airway Management Planned: Natural Airway and Mask  Additional Equipment:   Intra-op Plan:   Post-operative Plan:   Informed Consent: I have reviewed the patients History and Physical, chart, labs and discussed the procedure including the risks, benefits and alternatives for the proposed anesthesia with the patient or authorized representative who has indicated his/her understanding and acceptance.       Plan Discussed with: CRNA and Surgeon  Anesthesia Plan Comments:         Anesthesia Quick Evaluation

## 2019-04-02 NOTE — Anesthesia Postprocedure Evaluation (Signed)
Anesthesia Post Note  Patient: [name]  Procedure(s) Performed: CARDIOVERSION (N/A )     Patient location during evaluation: Endoscopy Anesthesia Type: General Level of consciousness: awake and alert, patient cooperative and oriented Pain management: pain level controlled Vital Signs Assessment: post-procedure vital signs reviewed and stable Respiratory status: spontaneous breathing, nonlabored ventilation and respiratory function stable Cardiovascular status: blood pressure returned to baseline and stable Postop Assessment: no apparent nausea or vomiting Anesthetic complications: no    Last Vitals:  Vitals:   04/02/19 0929 04/02/19 0939  BP: (!) 119/54 119/72  Pulse: 77 79  Resp: 17 12  Temp:    SpO2: 100% 100%    Last Pain:  Vitals:   04/02/19 0939  TempSrc:   PainSc: 0-No pain                 Tranisha Tissue,E. Miles Leyda

## 2019-04-02 NOTE — Transfer of Care (Signed)
Immediate Anesthesia Transfer of Care Note  Patient: [name] [name]  Procedure(s) Performed: CARDIOVERSION (N/A )  Patient Location: Endoscopy Unit  Anesthesia Type:MAC  Level of Consciousness: awake, alert  and oriented  Airway & Oxygen Therapy: Patient Spontanous Breathing and Patient connected to nasal cannula oxygen  Post-op Assessment: Report given to RN, Post -op Vital signs reviewed and stable and Patient moving all extremities  Post vital signs: Reviewed and stable  Last Vitals:  Vitals Value Taken Time  BP    Temp    Pulse    Resp    SpO2      Last Pain:  Vitals:   04/02/19 0800  TempSrc: Oral  PainSc: 0-No pain         Complications: No apparent anesthesia complications

## 2019-04-02 NOTE — Interval H&P Note (Signed)
History and Physical Interval Note:  04/02/2019 8:06 AM  Volney Presser  has presented today for surgery, with the diagnosis of afib.  The various methods of treatment have been discussed with the patient and family. After consideration of risks, benefits and other options for treatment, the patient has consented to  Procedure(s): CARDIOVERSION (N/A) as a surgical intervention.  The patient's history has been reviewed, patient examined, no change in status, stable for surgery.  I have reviewed the patient's chart and labs.  Questions were answered to the patient's satisfaction.     Jenkins Rouge

## 2019-04-02 NOTE — CV Procedure (Signed)
Hettinger: Anesthesia: Propofol  DCC X 2 120 then 150 J Converted from afib rate 118 bpm to A pacing / NSR with frequent PAC/PVC rates 80  On Rx eliquis with no missed doses No immediate neurologic sequelae  Jenkins Rouge MD Leonidas Romberg

## 2019-04-04 NOTE — Progress Notes (Signed)
Virtual Visit via Telephone Note  I was unable to reach the patient today via telephone for telephone visit.  4 attempts were made.  Unable to leave voicemail.  Location: Patient: Home Provider: [facility] - [id number], [place], [place], [place] [address]   I discussed the limitations, risks, security and privacy concerns of performing an evaluation and management service by telephone and the availability of in person appointments. I also discussed with the patient that there may be a patient responsible charge related to this service. The patient expressed understanding and agreed to proceed.  Patient consented to consult via telephone: Yes People present and their role in pt care: Pt     History of Present Illness:  86 year old male followed in our office for COPD and obstructive sleep apnea currently managed on CPAP  PMH: A. fib, GERD, DVT, hypertension, prostate cancer Smoker/ Smoking History: Former Smoker.  Quit in 1975.  52.5-pack-year smoking history Maintenance: Anoro Ellipta Pt of: [name]   Chief complaint: Acute Visit - Cough    04/05/19 - attempt 1045 04/05/19 - attempt 1050 04/05/19 - attempt 1111 04/05/19 - attempt 1139  Observations/Objective:  Spiro 2015: FEV1 1.92 (63%), ratio 55   Social History   Tobacco Use  Smoking Status Former Smoker  . Packs/day: 1.50  . Years: 35.00  . Pack years: 52.50  . Types: Cigarettes  . Quit date: 02/15/1973  . Years since quitting: 46.1  Smokeless Tobacco Never Used   Immunization History  Administered Date(s) Administered  . Fluad Quad(high Dose 65+) 11/02/2018  . Influenza Split 11/02/2015  . Influenza, High Dose Seasonal PF 11/14/2016, 11/07/2017  . Influenza,inj,Quad PF,6+ Mos 10/16/2012  . Influenza-Unspecified 11/15/2013  . Pneumococcal Conjugate-13 06/03/2014  . Pneumococcal-Unspecified 05/04/2013  . Tdap 10/05/2012  . Zoster 02/16/2011  . Zoster Recombinat (Shingrix) 04/15/2017,  06/15/2017      Assessment and Plan:  No problem-specific Assessment & Plan notes found for this encounter.   Follow Up Instructions:  Return if symptoms worsen or fail to improve, for Follow up with [name].   I discussed the assessment and treatment plan with the patient. The patient was provided an opportunity to ask questions and all were answered. The patient agreed with the plan and demonstrated an understanding of the instructions.   The patient was advised to call back or seek an in-person evaluation if the symptoms worsen or if the condition fails to improve as anticipated.  I provided 0 minutes of non-face-to-face time during this encounter.   Lauraine Rinne, NP

## 2019-04-05 ENCOUNTER — Ambulatory Visit: Payer: Medicare Other | Admitting: Pulmonary Disease

## 2019-04-05 ENCOUNTER — Ambulatory Visit (INDEPENDENT_AMBULATORY_CARE_PROVIDER_SITE_OTHER): Payer: Medicare Other | Admitting: Pulmonary Disease

## 2019-04-05 DIAGNOSIS — G4733 Obstructive sleep apnea (adult) (pediatric): Secondary | ICD-10-CM

## 2019-04-05 NOTE — Patient Instructions (Signed)
We attempted to reach you for separate times for your televisit on 04/05/2019  Please contact our office back to be rescheduled.    Wyn Quaker, FNP

## 2019-04-13 DIAGNOSIS — R634 Abnormal weight loss: Secondary | ICD-10-CM | POA: Diagnosis not present

## 2019-04-16 ENCOUNTER — Encounter: Payer: Self-pay | Admitting: Pulmonary Disease

## 2019-04-16 ENCOUNTER — Ambulatory Visit (INDEPENDENT_AMBULATORY_CARE_PROVIDER_SITE_OTHER): Payer: Medicare Other | Admitting: Pulmonary Disease

## 2019-04-16 ENCOUNTER — Other Ambulatory Visit: Payer: Self-pay

## 2019-04-16 DIAGNOSIS — G4733 Obstructive sleep apnea (adult) (pediatric): Secondary | ICD-10-CM

## 2019-04-16 DIAGNOSIS — J432 Centrilobular emphysema: Secondary | ICD-10-CM

## 2019-04-16 NOTE — Patient Instructions (Addendum)
You were seen today by Lauraine Rinne, NP  for:   Is was a pleasure talking to you over the phone today.  I am glad that you are feeling better.  Continue to use your CPAP.  Continue your Anoro Ellipta.  If you start to have a croupy sounding cough, wheezing, fatigue or increased shortness of breath please contact our office.  We will tentatively plan on seeing you again in around 3 months.  Sooner if you need it.  Do not hesitate to call us with any questions or concerns.  Aaron Edelman  1. OSA (obstructive sleep apnea)  We recommend that you continue using your CPAP daily >>>Keep up the hard work using your device >>> Goal should be wearing this for the entire night that you are sleeping, at least 4 to 6 hours  Remember:  . Do not drive or operate heavy machinery if tired or drowsy.  . Please notify the supply company and office if you are unable to use your device regularly due to missing supplies or machine being broken.  . Work on maintaining a healthy weight and following your recommended nutrition plan  . Maintain proper daily exercise and movement  . Maintaining proper use of your device can also help improve management of other chronic illnesses such as: Blood pressure, blood sugars, and weight management.   BiPAP/ CPAP Cleaning:  >>>Clean weekly, with Dawn soap, and bottle brush.  Set up to air dry. >>> Wipe mask out daily with wet wipe or towelette    2. Centrilobular emphysema (HCC)  Anoro Ellipta  >>> Take 1 puff daily in the morning right when you wake up >>>Rinse your mouth out after use >>>This is a daily maintenance inhaler, NOT a rescue inhaler >>>Contact our office if you are having difficulties affording or obtaining this medication >>>It is important for you to be able to take this daily and not miss any doses      Follow Up:    Return in about 3 months (around 07/17/2019), or if symptoms worsen or fail to improve, for Follow up with [name], Follow up with Wyn Quaker FNP-C.   Please do your part to reduce the spread of COVID-19:      Reduce your risk of any infection  and COVID19 by using the similar precautions used for avoiding the common cold or flu:  Marland Kitchen Wash your hands often with soap and warm water for at least 20 seconds.  If soap and water are not readily available, use an alcohol-based hand sanitizer with at least 60% alcohol.  . If coughing or sneezing, cover your mouth and nose by coughing or sneezing into the elbow areas of your shirt or coat, into a tissue or into your sleeve (not your hands). Langley Gauss A MASK when in public  . Avoid shaking hands with others and consider head nods or verbal greetings only. . Avoid touching your eyes, nose, or mouth with unwashed hands.  . Avoid close contact with people who are sick. . Avoid places or events with large numbers of people in one location, like concerts or sporting events. . If you have some symptoms but not all symptoms, continue to monitor at home and seek medical attention if your symptoms worsen. . If you are having a medical emergency, call 911.   ADDITIONAL HEALTHCARE OPTIONS FOR PATIENTS  [facility] / e-Visit: [email or web address]         [facility]: [phone]  [facility]  [facility]: [phone]                   [facility]: [id number]     It is flu season:   >>> Best ways to protect herself from the flu: Receive the yearly flu vaccine, practice good hand hygiene washing with soap and also using hand sanitizer when available, eat a nutritious meals, get adequate rest, hydrate appropriately   Please contact the office if your symptoms worsen or you have concerns that you are not improving.   Thank you for choosing [facility] for your healthcare, and for allowing [place] to partner with you on your healthcare journey. I am thankful to be able to provide care to you today.    Wyn Quaker FNP-C

## 2019-04-16 NOTE — Assessment & Plan Note (Signed)
Sounds like patient likely had a COPD exacerbation which is since resolved in February/2021  Plan: Continue Anoro Ellipta We will have follow-up with the patient around 3 months with [name] or myself

## 2019-04-16 NOTE — Assessment & Plan Note (Signed)
Plan: Continue CPAP therapy 
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
## 2019-04-19 ENCOUNTER — Ambulatory Visit: Payer: Medicare Other | Admitting: Physician Assistant

## 2019-04-20 ENCOUNTER — Ambulatory Visit (INDEPENDENT_AMBULATORY_CARE_PROVIDER_SITE_OTHER): Payer: Medicare Other | Admitting: Physician Assistant

## 2019-04-20 ENCOUNTER — Other Ambulatory Visit: Payer: Self-pay

## 2019-04-20 ENCOUNTER — Encounter: Payer: Self-pay | Admitting: Physician Assistant

## 2019-04-20 DIAGNOSIS — L97919 Non-pressure chronic ulcer of unspecified part of right lower leg with unspecified severity: Secondary | ICD-10-CM

## 2019-04-20 DIAGNOSIS — D649 Anemia, unspecified: Secondary | ICD-10-CM | POA: Diagnosis not present

## 2019-04-20 DIAGNOSIS — M2021 Hallux rigidus, right foot: Secondary | ICD-10-CM

## 2019-04-20 DIAGNOSIS — I87333 Chronic venous hypertension (idiopathic) with ulcer and inflammation of bilateral lower extremity: Secondary | ICD-10-CM | POA: Diagnosis not present

## 2019-04-20 DIAGNOSIS — L97929 Non-pressure chronic ulcer of unspecified part of left lower leg with unspecified severity: Secondary | ICD-10-CM

## 2019-04-20 NOTE — Progress Notes (Signed)
Office Visit Note   Patient: [name] [name]           Date of Birth: Jul 19, 1924           MRN: [id number] Visit Date: 04/20/2019              Requested by: Lawerance Cruel, [place],  [place] [address] PCP: Lawerance Cruel, MD  No chief complaint on file.     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  He has been wearing his compression socks.  Recently he was undergoing treatment for atrial fibrillation.  He is worried that his ulcer has not healed yet.  He feels it is about the same.  He is also having pain in the fifth and first MTP joints he is wearing unsupportive tennis shoes today that have a lot of flex in them and are without a wide toe box  Assessment & Plan: Visit Diagnoses: No diagnosis found.  Plan: He does have a pair of new balance shoes at home that are stiffer on the bottom.  I think this would be helpful in immobilizing his painful MTP joints.  Alternatively he could look into obtaining a pair of diabetic shoes.  He will continue to wear his compression stockings.  We discussed that he has a lot of edema in his lower extremity which makes it difficult for these ulcers to heal.  He should also try to elevate his feet as much as possible.  Follow-Up Instructions: No follow-ups on file.   Ortho Exam  Patient is alert, oriented, no adenopathy, well-dressed, normal affect, normal respiratory effort. Focused examination of his right lower extremity demonstrates a Wagner 1 ulcer there is a bleeding healthy wound bed when I removed his sock it is on the inner side of his calf and measures about 3 x 4 cm there is no surrounding cellulitis.  He does have a lot of pitting edema in the right and left lower extremities.  On the right side pulse was easily found with the Doppler.  He has pain with manipulation of the first and fifth MTP joints.   He has small thin calluses over the joints but these are not painful themselves.  The left side he has a moderate amount of pitting edema no callus is noted no tenderness to palpation he has a palpable dorsalis pedis pulse no cellulitis no ulcers  Imaging: No results found. No images are attached to the encounter.  Labs: Lab Results  Component Value Date   CRP 16.2 (H) 10/20/2011   LABURIC 4.7 10/17/2006   REPTSTATUS 03/31/2015 FINAL 03/30/2015   GRAMSTAIN Abundant 12/18/2015   GRAMSTAIN WBC present-predominately PMN 12/18/2015   GRAMSTAIN No Squamous Epithelial Cells Seen 12/18/2015   GRAMSTAIN No Organisms Seen 12/18/2015   CULT 9,000 COLONIES/mL INSIGNIFICANT GROWTH 03/30/2015   LABORGA NORMAL SKIN FLORA 12/18/2015     Lab Results  Component Value Date   ALBUMIN 4.1 05/18/2016   ALBUMIN 3.2 (L) 10/17/2012   ALBUMIN 1.9 (L) 10/24/2011   LABURIC 4.7 10/17/2006    Lab Results  Component Value Date   MG 1.8 12/11/2018   MG 1.8 08/06/2015   MG 1.7 10/20/2011   No results found for: VD25OH  No results found for: PREALBUMIN CBC EXTENDED Latest Ref Rng & Units 04/02/2019 12/11/2018 02/10/2018  WBC 3.4 - 10.8 x10E3/uL -  5.6 -  RBC 4.14 - 5.80 x10E6/uL - 3.04(L) -  HGB 13.0 - 17.0 g/dL 11.9(L) 10.0(L) 10.9(L)  HCT 39.0 - 52.0 % 35.0(L) 30.8(L) 32.0(L)  PLT 150 - 450 x10E3/uL - 160 -  NEUTROABS 1.5 - 6.5 K/uL - - -  LYMPHSABS 0.9 - 3.3 K/uL - - -     There is no height or weight on file to calculate BMI.  Orders:  No orders of the defined types were placed in this encounter.  No orders of the defined types were placed in this encounter.    Procedures: No procedures performed  Clinical Data: No additional findings.  ROS:  All other systems negative, except as noted in the HPI. Review of Systems  Objective: Vital Signs: There were no vitals taken for this visit.  Specialty Comments:  No specialty comments available.  PMFS History: Patient Active Problem  List   Diagnosis Date Noted  . Sacral fracture, closed ([place]) 04/13/2018  . PCO (posterior capsular opacification), bilateral 05/26/2017  . Weakness of right leg 04/20/2017  . 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 Exudative age-related macular degeneration of right eye with active choroidal neovascularization ([place]) 09/30/2016  . Pseudophakia of both eyes 09/30/2016  . Impingement syndrome of right shoulder 06/16/2016  . Impingement syndrome of left shoulder 06/16/2016  . Bilateral leg edema 06/14/2016  . Dizziness and giddiness 06/26/2015  . Abnormality of gait 06/26/2015  . Anxiety 04/14/2015  . GERD (gastroesophageal reflux disease) 04/03/2015  . OSA (obstructive sleep apnea) 04/03/2015  . HLD (hyperlipidemia) 04/03/2015  . SOB (shortness of breath)   . Left elbow fracture 03/29/2015  . Dizziness 01/01/2015  . LBP (low back pain) 01/01/2015  . Malignant neoplasm of prostate ([place]) 01/01/2015  . BP (high blood pressure) 01/01/2015  . DOE (dyspnea on exertion) 10/26/2013  . Essential hypertension, benign 10/26/2013  . Post-traumatic wound infection 10/17/2012  . Cellulitis 10/17/2012  . Atrial fibrillation ([place]) 10/22/2011  . DVT, lower extremity, distal, chronic ([place]) 10/22/2011  . Pacemaker 10/19/2011  . Prostate cancer ([place]) 10/19/2011  . COPD (chronic obstructive pulmonary disease) with emphysema ([place]) 02/04/2011  . Anemia 12/10/2010   Past Medical History:  Diagnosis Date  . Abnormality of gait    due to low back pain , uses  cane and walker  . Arthritis   . Bilateral lower extremity edema    wears TED hose  . Cancer [facility])    prostate- treated with radiation  . Cardiac pacemaker in situ 10/11/2006   medtronic  . Chronic iron deficiency anemia oncologist/ hematoloist-- dr Alen Blew   treatment IV Iron infusions  . Chronic low back pain   . CKD (chronic kidney disease), stage III   . COPD with emphysema [facility])    pulmologist-   dr Elsworth Soho  . DDD (degenerative disc disease), lumbosacral   . Degenerative scoliosis   . Dyspnea    with exertion  . Full dentures   . GERD (gastroesophageal reflux disease)   . Gross hematuria   . Headache    history of migraines- "way in the past"  . Hereditary and idiopathic peripheral neuropathy    bilateral lower leg  . Hiatal hernia   . History of DVT of lower extremity 10/19/2011   chronic distal popliteal right lower extremity  . History of external beam radiation therapy 2008   prostate cancer  . History of pancreatitis 2013  . History of pneumothorax 09/2006   iatrogenic--  in setting cardiac pacemaker placement  . History of prostate cancer 2008   s/p  external radiation therapy  . Hypertension   . Lesion of bladder   . Macular degeneration of right eye   . Mild mitral regurgitation   . Mild pulmonary hypertension ([place])   . Nocturia   . OSA on CPAP   . PAF (paroxysmal atrial fibrillation) ([place]) 10/2011   1st episode documented 09/ 2013 admission for gallstons/ pancreatitis and prior to cholecystectomy , went back in NSR without interventeion  . Presence of permanent cardiac pacemaker   . Sinus node dysfunction (HCC)    s/p  cardiac pacemaker placement 10-11-2006  . Wears glasses   . Wears hearing aid in both ears     Family History  Problem Relation Age of Onset  . Heart disease Father   . Heart disease Mother   . Colon cancer Mother   . Stroke Mother   . Cancer Sister   . Heart disease Sister   . Heart attack Neg Hx   . Hypertension Neg Hx     Past Surgical History:  Procedure Laterality Date  . BALLOON DILATION N/A 01/14/2015   Procedure: BALLOON DILATION;  Surgeon: Garlan Fair, MD;  Location: [facility];  Service: Endoscopy;  Laterality: N/A;  . BALLOON DILATION N/A 10/12/2017   Procedure: BALLOON DILATION;  Surgeon: Otis Brace, MD;  Location: [facility];  Service: Gastroenterology;  Laterality: N/A;  . BIOPSY  10/12/2017   Procedure:  BIOPSY;  Surgeon: Otis Brace, MD;  Location: [facility]COPY;  Service: Gastroenterology;;  . CARDIAC CATHETERIZATION  08-29-2000   [place]   no significant obstructive CAD, intact globle LV size and systolic function with regional wall motion abnormalities noted,  ?coronary spasm producing wall motion abnormality, elevated CPK and chest pain (ef 55%)  . CARDIAC PACEMAKER PLACEMENT  10-11-2006    dr Leonia Reeves   W/  ATRIAL LEAD REVISION 10-12-2006   (medtronic)  . CARDIOVASCULAR STRESS TEST  11-18-2011    dr Irish Lack   Low risk nuclear study w/ no ishemia/  normal wall motion,  post-stress ef 48% (LVEF 56% on prior study 2008)  . CARDIOVERSION N/A 04/02/2019   Procedure: CARDIOVERSION;  Surgeon: Josue Hector, MD;  Location: [facility] ENDOSCOPY;  Service: Cardiovascular;  Laterality: N/A;  . CARPAL TUNNEL RELEASE Left 11/14/2014   Procedure: CARPAL TUNNEL RELEASE LEFT THUMB;  Surgeon: Daryll Brod, MD;  Location: [facility];  Service: Orthopedics;  Laterality: Left;  ANESTHESIA:  IV REGIONAL FAB  . CATARACT EXTRACTION W/ INTRAOCULAR LENS  IMPLANT, BILATERAL    . CHOLECYSTECTOMY  10/23/2011  . CHOLECYSTECTOMY  10/23/2011   Procedure: CHOLECYSTECTOMY;  Surgeon: Rolm Bookbinder, MD;  Location: [facility];  Service: General;  Laterality: N/A;  with intraoperative cholangiogram  . COLONOSCOPY    . ESOPHAGOGASTRODUODENOSCOPY (EGD) WITH PROPOFOL N/A 01/14/2015   Procedure: ESOPHAGOGASTRODUODENOSCOPY (EGD) WITH PROPOFOL;  Surgeon: Garlan Fair, MD;  Location: [facility];  Service: Endoscopy;  Laterality: N/A;  . ESOPHAGOGASTRODUODENOSCOPY (EGD) WITH PROPOFOL N/A 10/12/2017   Procedure: ESOPHAGOGASTRODUODENOSCOPY (EGD) WITH PROPOFOL;  Surgeon: Otis Brace, MD;  Location: [facility];  Service: Gastroenterology;  Laterality: N/A;  . I & D EXTREMITY Left 10/20/2012   Procedure: LEFT LEG IRRIGATION AND DEBRIDEMENT, AND WOUND VAC APPLICATION;  Surgeon: Marianna Payment, MD;  Location: [facility];   Service: Orthopedics;  Laterality: Left;  . I & D EXTREMITY Left 10/22/2012   Procedure: IRRIGATION AND DEBRIDEMENT EXTREMITY;  Surgeon: Mosetta Pigeon  Erlinda Hong, MD;  Location: [facility];  Service: Orthopedics;  Laterality: Left;  . INGUINAL HERNIA REPAIR Left yrs ago  . LAPAROSCOPIC ASSISTED VENTRAL HERNIA REPAIR  08-08-2009   dr Excell Seltzer   AND OPEN REPAIR RECURRENT LEFT INGUINAL HERNIA  . LAPAROSCOPIC INGUINAL HERNIA REPAIR Left 07-24-1999    dr Excell Seltzer  . OPEN VENTRAL HERNIA REPAIR /  LYSIS ADHESIONS  09-23-2010    dr Donne Hazel  . ORIF ELBOW FRACTURE Left 03/29/2015   Procedure: LEFT ELBOW OPEN REDUCTION INTERNAL FIXATION (ORIF) DISTAL HUMERUS FRACTURE WITH OLECRANON OSTEOTOMY AND ULNAR NERVE RELEASE;  Surgeon: Roseanne Kaufman, MD;  Location: [facility];  Service: Orthopedics;  Laterality: Left;  . PPM GENERATOR CHANGEOUT N/A 02/10/2018   Procedure: PPM GENERATOR CHANGEOUT;  Surgeon: Deboraha Sprang, MD;  Location: [facility] CV LAB;  Service: Cardiovascular;  Laterality: N/A;  . REPAIR SUPRAUMBILICAL HERNIA   [id number]   dr Excell Seltzer  . SHOULDER ARTHROSCOPY WITH DISTAL CLAVICLE RESECTION Right 11-07-2007   dr Rhona Raider   [place] ACROMIOPLASTY  . SKIN SPLIT GRAFT Left 10/22/2012   Procedure: SKIN GRAFT SPLIT THICKNESS;  Surgeon: Marianna Payment, MD;  Location: [facility];  Service: Orthopedics;  Laterality: Left;  . TOTAL KNEE ARTHROPLASTY Right 1990's  . TRANSTHORACIC ECHOCARDIOGRAM  06-12-2012   dr Irish Lack   ef 50-55%/ mild LAE/ mild MR and TR/  AV sclerosis without stenosis/  RVSP 68mmHg  . TRANSURETHRAL RESECTION OF BLADDER TUMOR N/A 07/15/2017   Procedure: CYSTOSCOPY TRANSURETHRAL RESECTION OF BLADDER TUMOR (TURBT);  Surgeon: Kathie Rhodes, MD;  Location: [facility];  Service: Urology;  Laterality: N/A;   Social History   Occupational History  . 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 Smoking status: Former  Smoker    Packs/day: 1.50    Years: 35.00    Pack years: 52.50    Types: Cigarettes    Quit date: 02/15/1973    Years since quitting: 46.2  . Smokeless tobacco: Never Used  Substance and Sexual Activity  . Alcohol use: No  . Drug use: No  . Sexual activity: Never

## 2019-04-24 ENCOUNTER — Other Ambulatory Visit: Payer: Self-pay | Admitting: Internal Medicine

## 2019-04-24 NOTE — Telephone Encounter (Signed)
Patient calling the office for samples of medication:   1.  What medication and dosage are you requesting samples for? Eliquis  2.  Are you currently out of this medication? yes    

## 2019-04-25 ENCOUNTER — Telehealth: Payer: Self-pay

## 2019-04-25 NOTE — Telephone Encounter (Signed)
Spoke with Janan Halter and she was able to supply the pt with a 2 week supply of Eliquis samples until his meds arrive via mail order. Eliquis 5mg  Lot# [id number] Exp-03/2021 pt will be coming today by 4pm to pick up.

## 2019-04-25 NOTE — Telephone Encounter (Signed)
Pt states he got a call to verify his appointment with Dr. Caryl Comes on 04-30-2019 but he did not understand what all he supposed to do. I told him I will have Dr. Caryl Comes nurse Rosann Auerbach give him a call.

## 2019-04-26 NOTE — Telephone Encounter (Signed)
Spoke with pt who is asking if he needs to bring any additional documentation to his 04/30/2019 with Dr Caryl Comes.  Pt advised this his follow up appointment after having cardioversion.  Pt is verbalizes understanding and agrees with current plan.

## 2019-04-29 DIAGNOSIS — Z9889 Other specified postprocedural states: Secondary | ICD-10-CM | POA: Insufficient documentation

## 2019-04-30 ENCOUNTER — Encounter: Payer: Medicare Other | Admitting: Internal Medicine

## 2019-04-30 ENCOUNTER — Telehealth: Payer: Self-pay

## 2019-04-30 NOTE — Telephone Encounter (Signed)
The pt states he forgot about his appointment and needs to be rescheduled. I told him I will send a note to the scheduler to reschedule the appointment then give him a call back. The pt verbalized understanding and thanked me for my help.

## 2019-05-01 DIAGNOSIS — R634 Abnormal weight loss: Secondary | ICD-10-CM | POA: Diagnosis not present

## 2019-05-01 DIAGNOSIS — R195 Other fecal abnormalities: Secondary | ICD-10-CM | POA: Diagnosis not present

## 2019-05-03 ENCOUNTER — Telehealth: Payer: Self-pay

## 2019-05-03 DIAGNOSIS — Z961 Presence of intraocular lens: Secondary | ICD-10-CM | POA: Diagnosis not present

## 2019-05-03 DIAGNOSIS — H353122 Nonexudative age-related macular degeneration, left eye, intermediate dry stage: Secondary | ICD-10-CM | POA: Diagnosis not present

## 2019-05-03 DIAGNOSIS — H353211 Exudative age-related macular degeneration, right eye, with active choroidal neovascularization: Secondary | ICD-10-CM | POA: Diagnosis not present

## 2019-05-03 NOTE — Telephone Encounter (Signed)
The pt called again to reschedule his appointment with Dr. Caryl Comes. I told him I will notify Dr. Caryl Comes scheduler again and have her call to set up that appointment.

## 2019-05-08 ENCOUNTER — Ambulatory Visit (INDEPENDENT_AMBULATORY_CARE_PROVIDER_SITE_OTHER): Payer: Medicare Other | Admitting: Internal Medicine

## 2019-05-08 ENCOUNTER — Other Ambulatory Visit: Payer: Self-pay

## 2019-05-08 ENCOUNTER — Encounter: Payer: Self-pay | Admitting: Internal Medicine

## 2019-05-08 VITALS — BP 136/64 | HR 82 | Resp 15 | Ht 70.0 in | Wt 149.2 lb

## 2019-05-08 DIAGNOSIS — I4819 Other persistent atrial fibrillation: Secondary | ICD-10-CM | POA: Diagnosis not present

## 2019-05-08 DIAGNOSIS — I429 Cardiomyopathy, unspecified: Secondary | ICD-10-CM

## 2019-05-08 DIAGNOSIS — I493 Ventricular premature depolarization: Secondary | ICD-10-CM | POA: Diagnosis not present

## 2019-05-08 NOTE — Progress Notes (Signed)
Patient Care Team: Lawerance Cruel, MD as PCP - General ([place]) Jettie Booze, MD as PCP - Cardiology (Cardiology) Deboraha Sprang, MD as PCP - Electrophysiology (Cardiology) Wyatt Portela, MD (Hematology and Oncology) Kathie Rhodes, MD as Attending Physician (Urology)   HPI  [name] [name] is a 86 y.o. male Seen to establish pacemaker followup. He underwent device implantation for sinus node dysfunction in the setting of chronic dizziness; underwent generator replacement 12/19.  It also has a history of atrial fibrillation and hypertension. He is currently not on anticoagulation per Dr. Saundra Shelling. He has been diagnosed with peripheral neuropathy.   Recently cardioverted without improvement in functional status  Without overt shortness of breath or chest pain although he is markedly limited in his functional status for multiple reasons.  His breathlessness is stable.  He describes chronic bilateral edema.  Date Cr K Hgb  2/21 1.28 3.9 13.7             Echocardiogram 4/14 demonstrated normal LV function left atrial enlargement Myoview scan 10/13 demonstrated an EF of 48-56% without evidence of reversible ischemia.      Past Medical History:  Diagnosis Date  . Abnormality of gait    due to low back pain , uses  cane and walker  . Arthritis   . Bilateral lower extremity edema    wears TED hose  . Cancer [facility])    prostate- treated with radiation  . Cardiac pacemaker in situ 10/11/2006   medtronic  . Chronic iron deficiency anemia oncologist/ hematoloist-- dr Alen Blew   treatment IV Iron infusions  . Chronic low back pain   . CKD (chronic kidney disease), stage III   . COPD with emphysema [facility])    pulmologist-  dr Elsworth Soho  . DDD (degenerative disc disease), lumbosacral   . Degenerative scoliosis   . Dyspnea    with exertion  . Full dentures   . GERD (gastroesophageal reflux disease)   . Gross hematuria   . Headache    history of migraines-  "way in the past"  . Hereditary and idiopathic peripheral neuropathy    bilateral lower leg  . Hiatal hernia   . History of DVT of lower extremity 10/19/2011   chronic distal popliteal right lower extremity  . History of external beam radiation therapy 2008   prostate cancer  . History of pancreatitis 2013  . History of pneumothorax 09/2006   iatrogenic-- in setting cardiac pacemaker placement  . History of prostate cancer 2008   s/p  external radiation therapy  . Hypertension   . Lesion of bladder   . Macular degeneration of right eye   . Mild mitral regurgitation   . Mild pulmonary hypertension ([place])   . Nocturia   . OSA on CPAP   . PAF (paroxysmal atrial fibrillation) ([place]) 10/2011   1st episode documented 09/ 2013 admission for gallstons/ pancreatitis and prior to cholecystectomy , went back in NSR without interventeion  . Presence of permanent cardiac pacemaker   . Sinus node dysfunction (HCC)    s/p  cardiac pacemaker placement 10-11-2006  . Wears glasses   . Wears hearing aid in both ears       Current Outpatient Medications  Medication Sig Dispense Refill  . acetaminophen (TYLENOL) 650 MG CR tablet Take 650 mg by mouth 2 (two) times daily.    Marland Kitchen albuterol (PROVENTIL HFA;VENTOLIN HFA) 108 (90 Base) MCG/ACT inhaler Inhale 2 puffs into the lungs every  6 (six) hours as needed for wheezing or shortness of breath. 1 Inhaler 6  . apixaban (ELIQUIS) 5 MG TABS tablet Take 1 tablet (5 mg total) by mouth 2 (two) times daily. 60 tablet 5  . Calcium Carbonate-Vitamin D (CALCIUM-D PO) Take 1 tablet by mouth every evening.     . diphenhydrAMINE (BENADRYL) 25 MG tablet Take 25 mg by mouth daily as needed for allergies.     . ferrous sulfate 325 (65 FE) MG tablet Take 650 mg by mouth daily with breakfast.    . fluticasone (FLONASE) 50 MCG/ACT nasal spray Place 1 spray into both nostrils daily as needed for allergies or rhinitis.    . furosemide (LASIX) 20 MG tablet Take 20 mg by mouth  daily.     . methocarbamol (ROBAXIN) 500 MG tablet Take 1 tablet (500 mg total) by mouth every 8 (eight) hours as needed for muscle spasms. 30 tablet 1  . mirabegron ER (MYRBETRIQ) 50 MG TB24 tablet Take 50 mg by mouth daily.    . Multiple Vitamins-Minerals (PRESERVISION AREDS PO) Take 1 tablet by mouth 2 (two) times daily.    Marland Kitchen oxyCODONE (OXY IR/ROXICODONE) 5 MG immediate release tablet Take 5 mg by mouth every 6 (six) hours as needed for severe pain.    Marland Kitchen oxymetazoline (AFRIN) 0.05 % nasal spray Place 1 spray into both nostrils 2 (two) times daily as needed for congestion.    . pantoprazole (PROTONIX) 40 MG tablet Take 1 tablet (40 mg total) by mouth daily before breakfast. 90 tablet 1  . pentoxifylline (TRENTAL) 400 MG CR tablet Take 1 tablet (400 mg total) by mouth 3 (three) times daily with meals. 90 tablet 3  . predniSONE (DELTASONE) 10 MG tablet Take 1 tablet (10 mg total) by mouth every morning. 90 tablet 0  . Umeclidinium-Vilanterol (ANORO ELLIPTA) 62.5-25 MCG/INH AEPB Inhale 1 puff into the lungs daily.     . vitamin B-12 (CYANOCOBALAMIN) 500 MCG tablet Take 500 mcg by mouth every other day.      No current facility-administered medications for this visit.    No Known Allergies  Review of Systems negative except from HPI and PMH  Physical Exam BP 136/64   Pulse 82   Resp 15   Ht 5\' 10"  (1.778 m)   Wt 149 lb 3.2 oz (67.7 kg)   SpO2 98%   BMI 21.41 kg/m  Well developed and well nourished in no acute distress HENT normal Neck supple with JVP-flat Clear but decreased Device pocket well healed; without hematoma or erythema.  There is no tethering  Regular rate and rhythm, no  gallop No  murmur Abd-soft with active BS No Clubbing cyanosis  3+ edema Skin-warm and dry A & Oriented  Grossly normal sensory and motor function  ECG atrial pacing at 82 Interval 17/10/39 RSR prime PVCs-frequent with a left bundle inferior axis morphology   Assessment and  Plan  Sinus node  dysfunction  Pacemaker Medtronic   Hypertension   Renal insufficiency grade 3  PVCs left bundle inferior axis  Atrial tachycardia NonSustained   Peripheral edema      Blood pressure reasonably controlled.  Significant volume overload I gather there has been some reluctance to push diuretics given his renal issues.  He says his edema has been here for a long time.  The related concern is the impact on his weight and whether his appropriate Eliquis dose would be 2.5 mg twice daily or not.  We have had a  discussion today.  For now we will continue on 2.5.  I have reached out to his PCP to discuss this also with him as we try to navigate between Scylla and Charybdis.  His arrhythmias seem to be asymptomatic.  On Anticoagulation;  No bleeding issues

## 2019-05-08 NOTE — Patient Instructions (Signed)

## 2019-05-09 ENCOUNTER — Encounter: Payer: Self-pay | Admitting: Family

## 2019-05-09 ENCOUNTER — Ambulatory Visit (INDEPENDENT_AMBULATORY_CARE_PROVIDER_SITE_OTHER): Payer: Medicare Other | Admitting: Family

## 2019-05-09 ENCOUNTER — Ambulatory Visit: Payer: Self-pay

## 2019-05-09 VITALS — Ht 70.0 in | Wt 149.0 lb

## 2019-05-09 DIAGNOSIS — R195 Other fecal abnormalities: Secondary | ICD-10-CM | POA: Diagnosis not present

## 2019-05-09 DIAGNOSIS — M5442 Lumbago with sciatica, left side: Secondary | ICD-10-CM | POA: Diagnosis not present

## 2019-05-09 DIAGNOSIS — M5441 Lumbago with sciatica, right side: Secondary | ICD-10-CM

## 2019-05-09 LAB — CUP PACEART INCLINIC DEVICE CHECK
Battery Remaining Longevity: 148 mo
Battery Voltage: 3.05 V
Brady Statistic AP VP Percent: 4.02 %
Brady Statistic AP VS Percent: 56.82 %
Brady Statistic AS VP Percent: 0.25 %
Brady Statistic AS VS Percent: 38.91 %
Brady Statistic RA Percent Paced: 60.58 %
Brady Statistic RV Percent Paced: 4.3 %
Date Time Interrogation Session: 20210323151253
Implantable Lead Implant Date: 20080826
Implantable Lead Implant Date: 20080826
Implantable Lead Location: 753859
Implantable Lead Location: 753860
Implantable Lead Model: 5076
Implantable Lead Model: 5076
Implantable Pulse Generator Implant Date: 20191227
Lead Channel Impedance Value: 285 Ohm
Lead Channel Impedance Value: 304 Ohm
Lead Channel Impedance Value: 361 Ohm
Lead Channel Impedance Value: 380 Ohm
Lead Channel Pacing Threshold Amplitude: 0.75 V
Lead Channel Pacing Threshold Amplitude: 1.25 V
Lead Channel Pacing Threshold Pulse Width: 0.4 ms
Lead Channel Pacing Threshold Pulse Width: 0.6 ms
Lead Channel Sensing Intrinsic Amplitude: 2.625 mV
Lead Channel Sensing Intrinsic Amplitude: 2.75 mV
Lead Channel Setting Pacing Amplitude: 2 V
Lead Channel Setting Pacing Amplitude: 2.5 V
Lead Channel Setting Pacing Pulse Width: 0.6 ms
Lead Channel Setting Sensing Sensitivity: 1.2 mV

## 2019-05-09 NOTE — Progress Notes (Signed)
Office Visit Note   Patient: [name] [name]           Date of Birth: [birth year]           MRN: [id number] Visit Date: 05/09/2019              Requested by: Lawerance Cruel, [place],  [place] [id number] PCP: Lawerance Cruel, MD  Chief Complaint  Patient presents with  . Lower Back - Pain      HPI: The patients is a 86 year old gentleman who presents today for evaluation of low back pain.  Unfortunately she was attempting to get into his lift chair about a week ago when he fell between the lift chair and the near furniture he was unable to get up on its own and EMS had to come assist him in getting back up.  He declined a transportation to the emergency department.  He has had issues with ongoing low back pain this is excruciating he is currently taking oxycodone without any relief of his pain.  Denies any new numbness tingling or weakness no burning or stabbing pain down his lower extremities  Assessment & Plan: Visit Diagnoses:  1. Midline low back pain with bilateral sciatica, unspecified chronicity     Plan: Discussed conservative measures.  Patient will give [place] a report in 1 week on how he is doing.  Reassured no fractures no bony injury. He'll continue with his oxycodone. Heat or ice.   Follow-Up Instructions: Return in about 2 weeks (around 05/23/2019), or if symptoms worsen or fail to improve.   Back Exam   Tenderness  The patient is experiencing tenderness in the lumbar (paraspinals ).  Muscle Strength  The patient has normal back strength.      Patient is alert, oriented, no adenopathy, well-dressed, normal affect, normal respiratory effort.   Imaging: XR Lumbar Spine 2-3 Views  Result Date: 05/09/2019 Radiographs of lumbar spine largely unchanged from last viewing. No fractures. No acute finding.  No images are attached to the encounter.  Labs: Lab Results  Component Value Date   CRP 16.2 (H) 10/20/2011   LABURIC 4.7  10/17/2006   REPTSTATUS 03/31/2015 FINAL 03/30/2015   GRAMSTAIN Abundant 12/18/2015   GRAMSTAIN WBC present-predominately PMN 12/18/2015   GRAMSTAIN No Squamous Epithelial Cells Seen 12/18/2015   GRAMSTAIN No Organisms Seen 12/18/2015   CULT 9,000 COLONIES/mL INSIGNIFICANT GROWTH 03/30/2015   LABORGA NORMAL SKIN FLORA 12/18/2015     Lab Results  Component Value Date   ALBUMIN 4.1 05/18/2016   ALBUMIN 3.2 (L) 10/17/2012   ALBUMIN 1.9 (L) 10/24/2011   LABURIC 4.7 10/17/2006    Lab Results  Component Value Date   MG 1.8 12/11/2018   MG 1.8 08/06/2015   MG 1.7 10/20/2011   No results found for: VD25OH  No results found for: PREALBUMIN CBC EXTENDED Latest Ref Rng & Units 04/02/2019 12/11/2018 02/10/2018  WBC 3.4 - 10.8 x10E3/uL - 5.6 -  RBC 4.14 - 5.80 x10E6/uL - 3.04(L) -  HGB 13.0 - 17.0 g/dL 11.9(L) 10.0(L) 10.9(L)  HCT 39.0 - 52.0 % 35.0(L) 30.8(L) 32.0(L)  PLT 150 - 450 x10E3/uL - 160 -  NEUTROABS 1.5 - 6.5 K/uL - - -  LYMPHSABS 0.9 - 3.3 K/uL - - -     Body mass index is 21.38 kg/m.  Orders:  Orders Placed This Encounter  Procedures  . XR Lumbar Spine 2-3 Views   No orders of the defined types were  placed in this encounter.    Procedures: No procedures performed  Clinical Data: No additional findings.  ROS:  All other systems negative, except as noted in the HPI. Review of Systems  Constitutional: Negative for chills and fever.  Musculoskeletal: Positive for back pain and gait problem.  Neurological: Positive for weakness and numbness.    Objective: Vital Signs: Ht 5\' 10"  (1.778 m)   Wt 149 lb (67.6 kg)   BMI 21.38 kg/m   Specialty Comments:  No specialty comments available.  PMFS History: Patient Active Problem List   Diagnosis Date Noted  . History of cardioversion 04/29/2019  . Sacral fracture, closed ([place]) 04/13/2018  . PCO (posterior capsular opacification), bilateral 05/26/2017  . Weakness of right leg 04/20/2017  . 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 Exudative age-related macular degeneration of right eye with active choroidal neovascularization ([place]) 09/30/2016  . Pseudophakia of both eyes 09/30/2016  . Impingement syndrome of right shoulder 06/16/2016  . Impingement syndrome of left shoulder 06/16/2016  . Bilateral leg edema 06/14/2016  . Dizziness and giddiness 06/26/2015  . Abnormality of gait 06/26/2015  . Anxiety 04/14/2015  . GERD (gastroesophageal reflux disease) 04/03/2015  . OSA (obstructive sleep apnea) 04/03/2015  . HLD (hyperlipidemia) 04/03/2015  . SOB (shortness of breath)   . Left elbow fracture 03/29/2015  . Dizziness 01/01/2015  . LBP (low back pain) 01/01/2015  . Malignant neoplasm of prostate ([place]) 01/01/2015  . BP (high blood pressure) 01/01/2015  . DOE (dyspnea on exertion) 10/26/2013  . Essential hypertension, benign 10/26/2013  . Post-traumatic wound infection 10/17/2012  . Cellulitis 10/17/2012  . Atrial fibrillation ([place]) 10/22/2011  . DVT, lower extremity, distal, chronic ([place]) 10/22/2011  . Pacemaker 10/19/2011  . Prostate cancer ([place]) 10/19/2011  . COPD (chronic obstructive pulmonary disease) with emphysema ([place]) 02/04/2011  . Anemia 12/10/2010   Past Medical History:  Diagnosis Date  . Abnormality of gait    due to low back pain , uses  cane and walker  . Arthritis   . Bilateral lower extremity edema    wears TED hose  . Cancer [facility])    prostate- treated with radiation  . Cardiac pacemaker in situ 10/11/2006   medtronic  . Chronic iron deficiency anemia oncologist/ hematoloist-- dr Alen Blew   treatment IV Iron infusions  . Chronic low back pain   . CKD (chronic kidney disease), stage III   . COPD with emphysema [facility])    pulmologist-  dr Elsworth Soho  . DDD (degenerative disc disease), lumbosacral   . Degenerative scoliosis   . Dyspnea    with exertion  . Full dentures   . GERD (gastroesophageal reflux disease)   . Gross  hematuria   . Headache    history of migraines- "way in the past"  . Hereditary and idiopathic peripheral neuropathy    bilateral lower leg  . Hiatal hernia   . History of DVT of lower extremity 10/19/2011   chronic distal popliteal right lower extremity  . History of external beam radiation therapy 2008   prostate cancer  . History of pancreatitis 2013  . History of pneumothorax 09/2006   iatrogenic-- in setting cardiac pacemaker placement  . History of prostate cancer 2008   s/p  external radiation therapy  . Hypertension   . Lesion of bladder   . Macular degeneration of right eye   . Mild mitral regurgitation   . Mild pulmonary hypertension ([place])   .  Nocturia   . OSA on CPAP   . PAF (paroxysmal atrial fibrillation) ([place]) 10/2011   1st episode documented 09/ 2013 admission for gallstons/ pancreatitis and prior to cholecystectomy , went back in NSR without interventeion  . Presence of permanent cardiac pacemaker   . Sinus node dysfunction ([facility])    s/p  cardiac pacemaker placement 10-11-2006  . Wears glasses   . Wears hearing aid in both ears     Family History  Problem Relation Age of Onset  . Heart disease Father   . Heart disease Mother   . Colon cancer Mother   . Stroke Mother   . Cancer Sister   . Heart disease Sister   . Heart attack Neg Hx   . Hypertension Neg Hx     Past Surgical History:  Procedure Laterality Date  . BALLOON DILATION N/A 01/14/2015   Procedure: BALLOON DILATION;  Surgeon: Garlan Fair, MD;  Location: [facility];  Service: Endoscopy;  Laterality: N/A;  . BALLOON DILATION N/A 10/12/2017   Procedure: BALLOON DILATION;  Surgeon: Otis Brace, MD;  Location: [facility];  Service: Gastroenterology;  Laterality: N/A;  . BIOPSY  10/12/2017   Procedure: BIOPSY;  Surgeon: Otis Brace, MD;  Location: [facility];  Service: Gastroenterology;;  . CARDIAC CATHETERIZATION  08-29-2000   [facility]   no significant obstructive CAD, intact globle  LV size and systolic function with regional wall motion abnormalities noted,  ?coronary spasm producing wall motion abnormality, elevated CPK and chest pain (ef 55%)  . CARDIAC PACEMAKER PLACEMENT  10-11-2006    dr Leonia Reeves   W/  ATRIAL LEAD REVISION 10-12-2006   (medtronic)  . CARDIOVASCULAR STRESS TEST  11-18-2011    dr Irish Lack   Low risk nuclear study w/ no ishemia/  normal wall motion,  post-stress ef 48% (LVEF 56% on prior study 2008)  . CARDIOVERSION N/A 04/02/2019   Procedure: CARDIOVERSION;  Surgeon: Josue Hector, MD;  Location: [facility];  Service: Cardiovascular;  Laterality: N/A;  . CARPAL TUNNEL RELEASE Left 11/14/2014   Procedure: CARPAL TUNNEL RELEASE LEFT THUMB;  Surgeon: Daryll Brod, MD;  Location: [facility];  Service: Orthopedics;  Laterality: Left;  ANESTHESIA:  IV REGIONAL FAB  . CATARACT EXTRACTION W/ INTRAOCULAR LENS  IMPLANT, BILATERAL    . CHOLECYSTECTOMY  10/23/2011  . CHOLECYSTECTOMY  10/23/2011   Procedure: CHOLECYSTECTOMY;  Surgeon: Rolm Bookbinder, MD;  Location: [facility];  Service: General;  Laterality: N/A;  with intraoperative cholangiogram  . COLONOSCOPY    . ESOPHAGOGASTRODUODENOSCOPY (EGD) WITH PROPOFOL N/A 01/14/2015   Procedure: ESOPHAGOGASTRODUODENOSCOPY (EGD) WITH PROPOFOL;  Surgeon: Garlan Fair, MD;  Location: [facility];  Service: Endoscopy;  Laterality: N/A;  . ESOPHAGOGASTRODUODENOSCOPY (EGD) WITH PROPOFOL N/A 10/12/2017   Procedure: ESOPHAGOGASTRODUODENOSCOPY (EGD) WITH PROPOFOL;  Surgeon: Otis Brace, MD;  Location: [facility];  Service: Gastroenterology;  Laterality: N/A;  . I & D EXTREMITY Left 10/20/2012   Procedure: LEFT LEG IRRIGATION AND DEBRIDEMENT, AND WOUND VAC APPLICATION;  Surgeon: Marianna Payment, MD;  Location: [facility];  Service: Orthopedics;  Laterality: Left;  . I & D EXTREMITY Left 10/22/2012   Procedure: IRRIGATION AND DEBRIDEMENT EXTREMITY;  Surgeon: Marianna Payment, MD;  Location: [facility];  Service:  Orthopedics;  Laterality: Left;  . INGUINAL HERNIA REPAIR Left yrs ago  . LAPAROSCOPIC ASSISTED VENTRAL HERNIA REPAIR  08-08-2009   dr Excell Seltzer   AND OPEN REPAIR RECURRENT LEFT INGUINAL HERNIA  . LAPAROSCOPIC INGUINAL HERNIA REPAIR Left 07-24-1999  dr Excell Seltzer  . OPEN VENTRAL HERNIA REPAIR /  LYSIS ADHESIONS  09-23-2010    dr Donne Hazel  . ORIF ELBOW FRACTURE Left 03/29/2015   Procedure: LEFT ELBOW OPEN REDUCTION INTERNAL FIXATION (ORIF) DISTAL HUMERUS FRACTURE WITH OLECRANON OSTEOTOMY AND ULNAR NERVE RELEASE;  Surgeon: Roseanne Kaufman, MD;  Location: [place];  Service: Orthopedics;  Laterality: Left;  . PPM GENERATOR CHANGEOUT N/A 02/10/2018   Procedure: PPM GENERATOR CHANGEOUT;  Surgeon: Deboraha Sprang, MD;  Location: [facility];  Service: Cardiovascular;  Laterality: N/A;  . REPAIR SUPRAUMBILICAL HERNIA   [id number]   dr Excell Seltzer  . SHOULDER ARTHROSCOPY WITH DISTAL CLAVICLE RESECTION Right 11-07-2007   dr Rhona Raider   [place] ACROMIOPLASTY  . SKIN SPLIT GRAFT Left 10/22/2012   Procedure: SKIN GRAFT SPLIT THICKNESS;  Surgeon: Marianna Payment, MD;  Location: [facility];  Service: Orthopedics;  Laterality: Left;  . TOTAL KNEE ARTHROPLASTY Right 1990's  . TRANSTHORACIC ECHOCARDIOGRAM  06-12-2012   dr Irish Lack   ef 50-55%/ mild LAE/ mild MR and TR/  AV sclerosis without stenosis/  RVSP 22mmHg  . TRANSURETHRAL RESECTION OF BLADDER TUMOR N/A 07/15/2017   Procedure: CYSTOSCOPY TRANSURETHRAL RESECTION OF BLADDER TUMOR (TURBT);  Surgeon: Kathie Rhodes, MD;  Location: [facility];  Service: Urology;  Laterality: N/A;   Social History   Occupational History  . 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 Smoking status: Former Smoker    Packs/day: 1.50    Years: 35.00    Pack years: 52.50    Types: Cigarettes    Quit date: 02/15/1973    Years since quitting: 46.2  . Smokeless tobacco: Never Used  Substance and  Sexual Activity  . Alcohol use: No  . Drug use: No  . Sexual activity: Never

## 2019-05-10 ENCOUNTER — Encounter: Payer: Self-pay | Admitting: Podiatry

## 2019-05-10 ENCOUNTER — Ambulatory Visit (INDEPENDENT_AMBULATORY_CARE_PROVIDER_SITE_OTHER): Payer: Medicare Other | Admitting: Podiatry

## 2019-05-10 ENCOUNTER — Other Ambulatory Visit: Payer: Self-pay

## 2019-05-10 VITALS — Temp 95.3°F

## 2019-05-10 DIAGNOSIS — M779 Enthesopathy, unspecified: Secondary | ICD-10-CM | POA: Diagnosis not present

## 2019-05-10 DIAGNOSIS — L97511 Non-pressure chronic ulcer of other part of right foot limited to breakdown of skin: Secondary | ICD-10-CM

## 2019-05-10 NOTE — Progress Notes (Signed)
Subjective:   Patient ID: [name] [name], male   DOB: 86 y.o.   MRN: [id number]   HPI Patient presents with exquisite discomfort of the sinus tarsi bilateral and still has breakdown of tissue right hallux and now around the fifth metatarsal head right with irritation especially at night   ROS      Objective:  Physical Exam  Neurovascular status intact with inflammation pain of the sinus tarsi bilateral with breakdown of tissue of the right fifth MPJ and around the right hallux that is localized and mildly tender     Assessment:  Inflammatory capsulitis bilateral and slight breakdown of tissue localized with no drainage right hallux fifth metatarsal     Plan:  H&P reviewed both conditions sterile prep done and injected the sinus tarsi bilateral 3 mg Kenalog 5 mg Xylocaine and for the right I have recommended utilizing a offloading device to try to take pressure off the area and patient will be seen back to recheck and is instructed to continue with conservative treatment.  Discussed the ulcers if they were to get worse we will consider other treatment but at this point there is not much else we can do except for elevation and protection

## 2019-05-16 ENCOUNTER — Ambulatory Visit: Payer: Medicare Other | Admitting: Family

## 2019-05-16 ENCOUNTER — Other Ambulatory Visit: Payer: Self-pay

## 2019-05-16 DIAGNOSIS — M5441 Lumbago with sciatica, right side: Secondary | ICD-10-CM

## 2019-05-16 DIAGNOSIS — M5442 Lumbago with sciatica, left side: Secondary | ICD-10-CM

## 2019-05-24 ENCOUNTER — Other Ambulatory Visit: Payer: Self-pay

## 2019-05-24 ENCOUNTER — Ambulatory Visit
Admission: RE | Admit: 2019-05-24 | Discharge: 2019-05-24 | Disposition: A | Discharge status: Home / Self Care | Payer: Medicare Other | Source: Ambulatory Visit | Attending: Family | Admitting: Family

## 2019-05-24 DIAGNOSIS — M5441 Lumbago with sciatica, right side: Secondary | ICD-10-CM

## 2019-05-24 DIAGNOSIS — M5442 Lumbago with sciatica, left side: Secondary | ICD-10-CM

## 2019-05-24 DIAGNOSIS — M545 Low back pain: Secondary | ICD-10-CM | POA: Diagnosis not present

## 2019-05-29 ENCOUNTER — Ambulatory Visit: Payer: Medicare Other | Admitting: Orthopedic Surgery

## 2019-05-29 DIAGNOSIS — N183 Chronic kidney disease, stage 3 unspecified: Secondary | ICD-10-CM | POA: Diagnosis not present

## 2019-05-29 DIAGNOSIS — D6869 Other thrombophilia: Secondary | ICD-10-CM | POA: Diagnosis not present

## 2019-05-29 DIAGNOSIS — R2689 Other abnormalities of gait and mobility: Secondary | ICD-10-CM | POA: Diagnosis not present

## 2019-05-29 DIAGNOSIS — R634 Abnormal weight loss: Secondary | ICD-10-CM | POA: Diagnosis not present

## 2019-05-29 DIAGNOSIS — W19XXXA Unspecified fall, initial encounter: Secondary | ICD-10-CM | POA: Diagnosis not present
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
## 2019-05-31 ENCOUNTER — Ambulatory Visit: Payer: Medicare Other | Admitting: Orthopedic Surgery

## 2019-05-31 ENCOUNTER — Other Ambulatory Visit: Payer: Self-pay

## 2019-05-31 ENCOUNTER — Encounter: Payer: Self-pay | Admitting: Orthopedic Surgery

## 2019-05-31 ENCOUNTER — Ambulatory Visit (INDEPENDENT_AMBULATORY_CARE_PROVIDER_SITE_OTHER): Payer: Medicare Other | Admitting: Orthopedic Surgery

## 2019-05-31 VITALS — Ht 70.0 in | Wt 149.0 lb

## 2019-05-31 DIAGNOSIS — L97929 Non-pressure chronic ulcer of unspecified part of left lower leg with unspecified severity: Secondary | ICD-10-CM | POA: Diagnosis not present

## 2019-05-31 DIAGNOSIS — M5442 Lumbago with sciatica, left side: Secondary | ICD-10-CM

## 2019-05-31 DIAGNOSIS — I87333 Chronic venous hypertension (idiopathic) with ulcer and inflammation of bilateral lower extremity: Secondary | ICD-10-CM

## 2019-05-31 DIAGNOSIS — L97919 Non-pressure chronic ulcer of unspecified part of right lower leg with unspecified severity: Secondary | ICD-10-CM | POA: Diagnosis not present

## 2019-05-31 DIAGNOSIS — M5441 Lumbago with sciatica, right side: Secondary | ICD-10-CM

## 2019-05-31 MED ORDER — PREDNISONE 10 MG PO TABS: 20.0000 mg | ORAL_TABLET | Freq: Every day | ORAL | 0 refills | Status: DC

## 2019-05-31 NOTE — Progress Notes (Signed)
Office Visit Note   Patient: [name] [name]           Date of Birth: June 14, 1924           MRN: [id number] Visit Date: 05/31/2019              Requested by: Lawerance Cruel, [place],  [place] [address] PCP: Lawerance Cruel, MD  Chief Complaint  Patient presents with  . Lower Back - Follow-up    Review CT scan      HPI: Patient is a 86 year old gentleman is seen in follow-up for his lower back pain status post CT scan as well as venous stasis swelling of both lower extremities currently wearing compression stockings.  Patient states he has had minimal relief with the 10 mg of prednisone he states he has had no relief with Robaxin and is currently on Percocet as well with minimal relief.  Patient states he has had no relief with previous epidural steroid injections with Dr. Ernestina Patches.  He states that this was a waste of time and money.  Assessment & Plan: Visit Diagnoses:  1. Idiopathic chronic venous hypertension of both lower extremities with ulcer and inflammation ([place])   2. Midline low back pain with bilateral sciatica, unspecified chronicity     Plan: We will have him increase the prednisone to 20 mg with breakfast.  He is given a note for physical therapy for strengthening and conditioning for both lower extremities.  Follow-Up Instructions: Return in about 4 weeks (around 06/28/2019).   Ortho Exam  Patient is alert, oriented, no adenopathy, well-dressed, normal affect, normal respiratory effort. Examination patient has difficulty getting from a sitting to a standing position.  The compression socks were removed he does have pitting edema in both legs and both feet.  The venous ulcers on both legs have healed.  He does have a ulcer over the lateral border of the fifth metatarsal head of the right foot.  This is about 5 mm in diameter and 5 mm deep there is no cellulitis.  Patient also has an ulcer beneath the right great toe there are no ulcers on the  left foot.  Patient has a green discoloration on his legs that he states is from the Biofreeze that he is using.  Patient's CT scan was reviewed which shows advanced degenerative changes no acute fractures no acute pathology.  Patient was given a copy of his scan.  Imaging: No results found. No images are attached to the encounter.  Labs: Lab Results  Component Value Date   CRP 16.2 (H) 10/20/2011   LABURIC 4.7 10/17/2006   REPTSTATUS 03/31/2015 FINAL 03/30/2015   GRAMSTAIN Abundant 12/18/2015   GRAMSTAIN WBC present-predominately PMN 12/18/2015   GRAMSTAIN No Squamous Epithelial Cells Seen 12/18/2015   GRAMSTAIN No Organisms Seen 12/18/2015   CULT 9,000 COLONIES/mL INSIGNIFICANT GROWTH 03/30/2015   LABORGA NORMAL SKIN FLORA 12/18/2015     Lab Results  Component Value Date   ALBUMIN 4.1 05/18/2016   ALBUMIN 3.2 (L) 10/17/2012   ALBUMIN 1.9 (L) 10/24/2011   LABURIC 4.7 10/17/2006    Lab Results  Component Value Date   MG 1.8 12/11/2018   MG 1.8 08/06/2015   MG 1.7 10/20/2011   No results found for: VD25OH  No results found for: PREALBUMIN CBC EXTENDED Latest Ref Rng & Units 04/02/2019 12/11/2018 02/10/2018  WBC 3.4 - 10.8 x10E3/uL - 5.6 -  RBC 4.14 - 5.80 x10E6/uL - 3.04(L) -  HGB 13.0 - 17.0 g/dL 11.9(L) 10.0(L) 10.9(L)  HCT 39.0 - 52.0 % 35.0(L) 30.8(L) 32.0(L)  PLT 150 - 450 x10E3/uL - 160 -  NEUTROABS 1.5 - 6.5 K/uL - - -  LYMPHSABS 0.9 - 3.3 K/uL - - -     Body mass index is 21.38 kg/m.  Orders:  No orders of the defined types were placed in this encounter.  No orders of the defined types were placed in this encounter.    Procedures: No procedures performed  Clinical Data: No additional findings.  ROS:  All other systems negative, except as noted in the HPI. Review of Systems  Objective: Vital Signs: Ht 5\' 10"  (1.778 m)   Wt 149 lb (67.6 kg)   BMI 21.38 kg/m   Specialty Comments:  No specialty comments available.  PMFS  History: Patient Active Problem List   Diagnosis Date Noted  . History of cardioversion 04/29/2019  . Sacral fracture, closed ([place]) 04/13/2018  . PCO (posterior capsular opacification), bilateral 05/26/2017  . Weakness of right leg 04/20/2017  . 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 Exudative age-related macular degeneration of right eye with active choroidal neovascularization ([place]) 09/30/2016  . Pseudophakia of both eyes 09/30/2016  . Impingement syndrome of right shoulder 06/16/2016  . Impingement syndrome of left shoulder 06/16/2016  . Bilateral leg edema 06/14/2016  . Dizziness and giddiness 06/26/2015  . Abnormality of gait 06/26/2015  . Anxiety 04/14/2015  . GERD (gastroesophageal reflux disease) 04/03/2015  . OSA (obstructive sleep apnea) 04/03/2015  . HLD (hyperlipidemia) 04/03/2015  . SOB (shortness of breath)   . Left elbow fracture 03/29/2015  . Dizziness 01/01/2015  . LBP (low back pain) 01/01/2015  . Malignant neoplasm of prostate ([place]) 01/01/2015  . BP (high blood pressure) 01/01/2015  . DOE (dyspnea on exertion) 10/26/2013  . Essential hypertension, benign 10/26/2013  . Post-traumatic wound infection 10/17/2012  . Cellulitis 10/17/2012  . Atrial fibrillation ([place]) 10/22/2011  . DVT, lower extremity, distal, chronic ([place]) 10/22/2011  . Pacemaker 10/19/2011  . Prostate cancer ([place]) 10/19/2011  . COPD (chronic obstructive pulmonary disease) with emphysema ([place]) 02/04/2011  . Anemia 12/10/2010   Past Medical History:  Diagnosis Date  . Abnormality of gait    due to low back pain , uses  cane and walker  . Arthritis   . Bilateral lower extremity edema    wears TED hose  . Cancer [facility])    prostate- treated with radiation  . Cardiac pacemaker in situ 10/11/2006   medtronic  . Chronic iron deficiency anemia oncologist/ hematoloist-- dr Alen Blew   treatment IV Iron infusions  . Chronic low back pain   . CKD (chronic  kidney disease), stage III   . COPD with emphysema [facility])    pulmologist-  dr Elsworth Soho  . DDD (degenerative disc disease), lumbosacral   . Degenerative scoliosis   . Dyspnea    with exertion  . Full dentures   . GERD (gastroesophageal reflux disease)   . Gross hematuria   . Headache    history of migraines- "way in the past"  . Hereditary and idiopathic peripheral neuropathy    bilateral lower leg  . Hiatal hernia   . History of DVT of lower extremity 10/19/2011   chronic distal popliteal right lower extremity  . History of external beam radiation therapy 2008   prostate cancer  . History of pancreatitis 2013  . History of pneumothorax 09/2006   iatrogenic-- in  setting cardiac pacemaker placement  . History of prostate cancer 2008   s/p  external radiation therapy  . Hypertension   . Lesion of bladder   . Macular degeneration of right eye   . Mild mitral regurgitation   . Mild pulmonary hypertension ([place])   . Nocturia   . OSA on CPAP   . PAF (paroxysmal atrial fibrillation) ([place]) 10/2011   1st episode documented 09/ 2013 admission for gallstons/ pancreatitis and prior to cholecystectomy , went back in NSR without interventeion  . Presence of permanent cardiac pacemaker   . Sinus node dysfunction ([facility])    s/p  cardiac pacemaker placement 10-11-2006  . Wears glasses   . Wears hearing aid in both ears     Family History  Problem Relation Age of Onset  . Heart disease Father   . Heart disease Mother   . Colon cancer Mother   . Stroke Mother   . Cancer Sister   . Heart disease Sister   . Heart attack Neg Hx   . Hypertension Neg Hx     Past Surgical History:  Procedure Laterality Date  . BALLOON DILATION N/A 01/14/2015   Procedure: BALLOON DILATION;  Surgeon: Garlan Fair, MD;  Location: [facility];  Service: Endoscopy;  Laterality: N/A;  . BALLOON DILATION N/A 10/12/2017   Procedure: BALLOON DILATION;  Surgeon: Otis Brace, MD;  Location: [facility];   Service: Gastroenterology;  Laterality: N/A;  . BIOPSY  10/12/2017   Procedure: BIOPSY;  Surgeon: Otis Brace, MD;  Location: [facility];  Service: Gastroenterology;;  . CARDIAC CATHETERIZATION  08-29-2000   [place]   no significant obstructive CAD, intact globle LV size and systolic function with regional wall motion abnormalities noted,  ?coronary spasm producing wall motion abnormality, elevated CPK and chest pain (ef 55%)  . CARDIAC PACEMAKER PLACEMENT  10-11-2006    dr Leonia Reeves   W/  ATRIAL LEAD REVISION 10-12-2006   (medtronic)  . CARDIOVASCULAR STRESS TEST  11-18-2011    dr Irish Lack   Low risk nuclear study w/ no ishemia/  normal wall motion,  post-stress ef 48% (LVEF 56% on prior study 2008)  . CARDIOVERSION N/A 04/02/2019   Procedure: CARDIOVERSION;  Surgeon: Josue Hector, MD;  Location: [facility]COPY;  Service: Cardiovascular;  Laterality: N/A;  . CARPAL TUNNEL RELEASE Left 11/14/2014   Procedure: CARPAL TUNNEL RELEASE LEFT THUMB;  Surgeon: Daryll Brod, MD;  Location: [facility];  Service: Orthopedics;  Laterality: Left;  ANESTHESIA:  IV REGIONAL FAB  . CATARACT EXTRACTION W/ INTRAOCULAR LENS  IMPLANT, BILATERAL    . CHOLECYSTECTOMY  10/23/2011  . CHOLECYSTECTOMY  10/23/2011   Procedure: CHOLECYSTECTOMY;  Surgeon: Rolm Bookbinder, MD;  Location: [facility] [place];  Service: General;  Laterality: N/A;  with intraoperative cholangiogram  . COLONOSCOPY    . ESOPHAGOGASTRODUODENOSCOPY (EGD) WITH PROPOFOL N/A 01/14/2015   Procedure: ESOPHAGOGASTRODUODENOSCOPY (EGD) WITH PROPOFOL;  Surgeon: Garlan Fair, MD;  Location: [facility];  Service: Endoscopy;  Laterality: N/A;  . ESOPHAGOGASTRODUODENOSCOPY (EGD) WITH PROPOFOL N/A 10/12/2017   Procedure: ESOPHAGOGASTRODUODENOSCOPY (EGD) WITH PROPOFOL;  Surgeon: Otis Brace, MD;  Location: [place];  Service: Gastroenterology;  Laterality: N/A;  . I & D EXTREMITY Left 10/20/2012   Procedure: LEFT LEG IRRIGATION AND DEBRIDEMENT, AND  WOUND VAC APPLICATION;  Surgeon: Marianna Payment, MD;  Location: [facility];  Service: Orthopedics;  Laterality: Left;  . I & D EXTREMITY Left 10/22/2012   Procedure: IRRIGATION AND DEBRIDEMENT EXTREMITY;  Surgeon: Marianna Payment,  MD;  Location: [facility];  Service: Orthopedics;  Laterality: Left;  . INGUINAL HERNIA REPAIR Left yrs ago  . LAPAROSCOPIC ASSISTED VENTRAL HERNIA REPAIR  08-08-2009   dr Excell Seltzer   AND OPEN REPAIR RECURRENT LEFT INGUINAL HERNIA  . LAPAROSCOPIC INGUINAL HERNIA REPAIR Left 07-24-1999    dr Excell Seltzer  . OPEN VENTRAL HERNIA REPAIR /  LYSIS ADHESIONS  09-23-2010    dr Donne Hazel  . ORIF ELBOW FRACTURE Left 03/29/2015   Procedure: LEFT ELBOW OPEN REDUCTION INTERNAL FIXATION (ORIF) DISTAL HUMERUS FRACTURE WITH OLECRANON OSTEOTOMY AND ULNAR NERVE RELEASE;  Surgeon: Roseanne Kaufman, MD;  Location: [place];  Service: Orthopedics;  Laterality: Left;  . PPM GENERATOR CHANGEOUT N/A 02/10/2018   Procedure: PPM GENERATOR CHANGEOUT;  Surgeon: Deboraha Sprang, MD;  Location: [place] [facility];  Service: Cardiovascular;  Laterality: N/A;  . REPAIR SUPRAUMBILICAL HERNIA   [id number]   dr Excell Seltzer  . SHOULDER ARTHROSCOPY WITH DISTAL CLAVICLE RESECTION Right 11-07-2007   dr Rhona Raider   [place] ACROMIOPLASTY  . SKIN SPLIT GRAFT Left 10/22/2012   Procedure: SKIN GRAFT SPLIT THICKNESS;  Surgeon: Marianna Payment, MD;  Location: [facility];  Service: Orthopedics;  Laterality: Left;  . TOTAL KNEE ARTHROPLASTY Right 1990's  . TRANSTHORACIC ECHOCARDIOGRAM  06-12-2012   dr Irish Lack   ef 50-55%/ mild LAE/ mild MR and TR/  AV sclerosis without stenosis/  RVSP 78mmHg  . TRANSURETHRAL RESECTION OF BLADDER TUMOR N/A 07/15/2017   Procedure: CYSTOSCOPY TRANSURETHRAL RESECTION OF BLADDER TUMOR (TURBT);  Surgeon: Kathie Rhodes, MD;  Location: [facility];  Service: Urology;  Laterality: N/A;   Social History   Occupational History  . 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 Smoking status: Former Smoker    Packs/day: 1.50    Years: 35.00    Pack years: 52.50    Types: Cigarettes    Quit date: 02/15/1973    Years since quitting: 46.3  . Smokeless tobacco: Never Used  Substance and Sexual Activity  . Alcohol use: No  . Drug use: No  . Sexual activity: Never

## 2019-06-05 ENCOUNTER — Other Ambulatory Visit: Payer: Medicare Other

## 2019-06-19 ENCOUNTER — Telehealth: Payer: Self-pay | Admitting: Orthopedic Surgery

## 2019-06-19 NOTE — Telephone Encounter (Signed)
Patient would like for you to call him.  He said it is very important that he speaks with you.  CB#[phone].  Thank you.

## 2019-06-19 NOTE — Telephone Encounter (Signed)
I tried to call the pt x 3 and the line is busy. Will hold and try again tomorrow.

## 2019-06-20 ENCOUNTER — Ambulatory Visit (INDEPENDENT_AMBULATORY_CARE_PROVIDER_SITE_OTHER): Payer: Medicare Other

## 2019-06-20 ENCOUNTER — Encounter: Payer: Self-pay | Admitting: Family

## 2019-06-20 ENCOUNTER — Other Ambulatory Visit: Payer: Self-pay

## 2019-06-20 ENCOUNTER — Ambulatory Visit (INDEPENDENT_AMBULATORY_CARE_PROVIDER_SITE_OTHER): Payer: Medicare Other | Admitting: Family

## 2019-06-20 VITALS — Ht 70.0 in | Wt 149.0 lb

## 2019-06-20 DIAGNOSIS — M25512 Pain in left shoulder: Secondary | ICD-10-CM

## 2019-06-20 DIAGNOSIS — R0781 Pleurodynia: Secondary | ICD-10-CM

## 2019-06-20 NOTE — Progress Notes (Signed)
Office Visit Note   Patient: [name] [name]           Date of Birth: 31-May-1924           MRN: [id number] Visit Date: 06/20/2019              Requested by: Lawerance Cruel, [place],  [place] [id number] PCP: Lawerance Cruel, MD  Chief Complaint  Patient presents with  . Left Shoulder - Pain    S/p fall several days ago.       HPI: The patient is a 86 year old gentleman who presents today complaining of left shoulder pain as well as left flank pain following a fall in his kitchen.  His son accompanies the visit.  It is unclear exactly how he fell they propose that he may have fallen on the counter and caught himself up under his armpit on the left.  He also sustained several skin tears these have been Steri-Stripped to the left upper arm  The patient continues to be living independently at home with his wife.  Assessment & Plan: Visit Diagnoses:  1. Acute pain of left shoulder   2. Rib pain     Plan: Discussed conservative measures such as heat and ice using Biofreeze as well as Aspercreme for his joint aches and pains.  He currently has a chronic oxycodone prescription that he has been using which she states takes the edge off of his pain.  The patient states that his primary care doctor and his cardiologist have told him he cannot take Tylenol we discussed this at length  Of note his son states he will likely be moving to assisted living shortly  Follow-Up Instructions: Return if symptoms worsen or fail to improve.   Left Shoulder Exam   Tenderness  The patient is experiencing tenderness in the acromioclavicular joint (scapula).  Muscle Strength  The patient has normal left shoulder strength.      Patient is alert, oriented, no adenopathy, well-dressed, normal affect, normal respiratory effort. Ecchymosis to left chest and sternum, as well as left flank. Skin tear to left upper arm a length of 10 cm with steri strips in place. No  bleeding. Reports tenderness to left flank, ribs and shoulder, however does not appear to illicit intense pain to palpation  Imaging: XR Chest 2 View  Result Date: 06/20/2019 Radiographs of the chest without evidence of fracture. No acute finding.   XR Shoulder Left  Result Date: 06/20/2019 Radiographs of left shoulder without acute finding. There are significant degenerative changes of the left shoulder. Superior migration of humeral head.   No images are attached to the encounter.  Labs: Lab Results  Component Value Date   CRP 16.2 (H) 10/20/2011   LABURIC 4.7 10/17/2006   REPTSTATUS 03/31/2015 FINAL 03/30/2015   GRAMSTAIN Abundant 12/18/2015   GRAMSTAIN WBC present-predominately PMN 12/18/2015   GRAMSTAIN No Squamous Epithelial Cells Seen 12/18/2015   GRAMSTAIN No Organisms Seen 12/18/2015   CULT 9,000 COLONIES/mL INSIGNIFICANT GROWTH 03/30/2015   LABORGA NORMAL SKIN FLORA 12/18/2015     Lab Results  Component Value Date   ALBUMIN 4.1 05/18/2016   ALBUMIN 3.2 (L) 10/17/2012   ALBUMIN 1.9 (L) 10/24/2011   LABURIC 4.7 10/17/2006    Lab Results  Component Value Date   MG 1.8 12/11/2018   MG 1.8 08/06/2015   MG 1.7 10/20/2011   No results found for: VD25OH  No results found for: PREALBUMIN CBC EXTENDED Latest Ref  Rng & Units 04/02/2019 12/11/2018 02/10/2018  WBC 3.4 - 10.8 x10E3/uL - 5.6 -  RBC 4.14 - 5.80 x10E6/uL - 3.04(L) -  HGB 13.0 - 17.0 g/dL 11.9(L) 10.0(L) 10.9(L)  HCT 39.0 - 52.0 % 35.0(L) 30.8(L) 32.0(L)  PLT 150 - 450 x10E3/uL - 160 -  NEUTROABS 1.5 - 6.5 K/uL - - -  LYMPHSABS 0.9 - 3.3 K/uL - - -     Body mass index is 21.38 kg/m.  Orders:  Orders Placed This Encounter  Procedures  . XR Shoulder Left  . XR Chest 2 View   No orders of the defined types were placed in this encounter.    Procedures: No procedures performed  Clinical Data: No additional findings.  ROS:  All other systems negative, except as noted in the HPI. Review of  Systems  Constitutional: Negative for chills and fever.  Respiratory: Negative for chest tightness and shortness of breath.   Cardiovascular: Negative for chest pain.  Musculoskeletal: Positive for arthralgias and myalgias.  Skin: Positive for color change and wound.  Neurological: Positive for weakness.    Objective: Vital Signs: Ht 5\' 10"  (1.778 m)   Wt 149 lb (67.6 kg)   BMI 21.38 kg/m   Specialty Comments:  No specialty comments available.  PMFS History: Patient Active Problem List   Diagnosis Date Noted  . History of cardioversion 04/29/2019  . Sacral fracture, closed ([place]) 04/13/2018  . PCO (posterior capsular opacification), bilateral 05/26/2017  . Weakness of right leg 04/20/2017  . 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 Exudative age-related macular degeneration of right eye with active choroidal neovascularization ([place]) 09/30/2016  . Pseudophakia of both eyes 09/30/2016  . Impingement syndrome of right shoulder 06/16/2016  . Impingement syndrome of left shoulder 06/16/2016  . Bilateral leg edema 06/14/2016  . Dizziness and giddiness 06/26/2015  . Abnormality of gait 06/26/2015  . Anxiety 04/14/2015  . GERD (gastroesophageal reflux disease) 04/03/2015  . OSA (obstructive sleep apnea) 04/03/2015  . HLD (hyperlipidemia) 04/03/2015  . SOB (shortness of breath)   . Left elbow fracture 03/29/2015  . Dizziness 01/01/2015  . LBP (low back pain) 01/01/2015  . Malignant neoplasm of prostate ([place]) 01/01/2015  . BP (high blood pressure) 01/01/2015  . DOE (dyspnea on exertion) 10/26/2013  . Essential hypertension, benign 10/26/2013  . Post-traumatic wound infection 10/17/2012  . Cellulitis 10/17/2012  . Atrial fibrillation ([place]) 10/22/2011  . DVT, lower extremity, distal, chronic ([place]) 10/22/2011  . Pacemaker 10/19/2011  . Prostate cancer ([place]) 10/19/2011  . COPD (chronic obstructive pulmonary disease) with emphysema ([place])  02/04/2011  . Anemia 12/10/2010   Past Medical History:  Diagnosis Date  . Abnormality of gait    due to low back pain , uses  cane and walker  . Arthritis   . Bilateral lower extremity edema    wears TED hose  . Cancer [facility])    prostate- treated with radiation  . Cardiac pacemaker in situ 10/11/2006   medtronic  . Chronic iron deficiency anemia oncologist/ hematoloist-- dr Alen Blew   treatment IV Iron infusions  . Chronic low back pain   . CKD (chronic kidney disease), stage III   . COPD with emphysema [facility])    pulmologist-  dr Elsworth Soho  . DDD (degenerative disc disease), lumbosacral   . Degenerative scoliosis   . Dyspnea    with exertion  . Full dentures   . GERD (gastroesophageal reflux disease)   . Gross hematuria   .  Headache    history of migraines- "way in the past"  . Hereditary and idiopathic peripheral neuropathy    bilateral lower leg  . Hiatal hernia   . History of DVT of lower extremity 10/19/2011   chronic distal popliteal right lower extremity  . History of external beam radiation therapy 2008   prostate cancer  . History of pancreatitis 2013  . History of pneumothorax 09/2006   iatrogenic-- in setting cardiac pacemaker placement  . History of prostate cancer 2008   s/p  external radiation therapy  . Hypertension   . Lesion of bladder   . Macular degeneration of right eye   . Mild mitral regurgitation   . Mild pulmonary hypertension ([place])   . Nocturia   . OSA on CPAP   . PAF (paroxysmal atrial fibrillation) ([place]) 10/2011   1st episode documented 09/ 2013 admission for gallstons/ pancreatitis and prior to cholecystectomy , went back in NSR without interventeion  . Presence of permanent cardiac pacemaker   . Sinus node dysfunction (HCC)    s/p  cardiac pacemaker placement 10-11-2006  . Wears glasses   . Wears hearing aid in both ears     Family History  Problem Relation Age of Onset  . Heart disease Father   . Heart disease Mother   . Colon cancer  Mother   . Stroke Mother   . Cancer Sister   . Heart disease Sister   . Heart attack Neg Hx   . Hypertension Neg Hx     Past Surgical History:  Procedure Laterality Date  . BALLOON DILATION N/A 01/14/2015   Procedure: BALLOON DILATION;  Surgeon: Garlan Fair, MD;  Location: [facility];  Service: Endoscopy;  Laterality: N/A;  . BALLOON DILATION N/A 10/12/2017   Procedure: BALLOON DILATION;  Surgeon: Otis Brace, MD;  Location: [facility];  Service: Gastroenterology;  Laterality: N/A;  . BIOPSY  10/12/2017   Procedure: BIOPSY;  Surgeon: Otis Brace, MD;  Location: [facility];  Service: Gastroenterology;;  . CARDIAC CATHETERIZATION  08-29-2000   [place]   no significant obstructive CAD, intact globle LV size and systolic function with regional wall motion abnormalities noted,  ?coronary spasm producing wall motion abnormality, elevated CPK and chest pain (ef 55%)  . CARDIAC PACEMAKER PLACEMENT  10-11-2006    dr Leonia Reeves   W/  ATRIAL LEAD REVISION 10-12-2006   (medtronic)  . CARDIOVASCULAR STRESS TEST  11-18-2011    dr Irish Lack   Low risk nuclear study w/ no ishemia/  normal wall motion,  post-stress ef 48% (LVEF 56% on prior study 2008)  . CARDIOVERSION N/A 04/02/2019   Procedure: CARDIOVERSION;  Surgeon: Josue Hector, MD;  Location: [facility]COPY;  Service: Cardiovascular;  Laterality: N/A;  . CARPAL TUNNEL RELEASE Left 11/14/2014   Procedure: CARPAL TUNNEL RELEASE LEFT THUMB;  Surgeon: Daryll Brod, MD;  Location: [facility];  Service: Orthopedics;  Laterality: Left;  ANESTHESIA:  IV REGIONAL FAB  . CATARACT EXTRACTION W/ INTRAOCULAR LENS  IMPLANT, BILATERAL    . CHOLECYSTECTOMY  10/23/2011  . CHOLECYSTECTOMY  10/23/2011   Procedure: CHOLECYSTECTOMY;  Surgeon: Rolm Bookbinder, MD;  Location: [facility];  Service: General;  Laterality: N/A;  with intraoperative cholangiogram  . COLONOSCOPY    . ESOPHAGOGASTRODUODENOSCOPY (EGD) WITH PROPOFOL N/A 01/14/2015    Procedure: ESOPHAGOGASTRODUODENOSCOPY (EGD) WITH PROPOFOL;  Surgeon: Garlan Fair, MD;  Location: [facility];  Service: Endoscopy;  Laterality: N/A;  . ESOPHAGOGASTRODUODENOSCOPY (EGD) WITH PROPOFOL N/A 10/12/2017   Procedure:  ESOPHAGOGASTRODUODENOSCOPY (EGD) WITH PROPOFOL;  Surgeon: Otis Brace, MD;  Location: [facility];  Service: Gastroenterology;  Laterality: N/A;  . I & D EXTREMITY Left 10/20/2012   Procedure: LEFT LEG IRRIGATION AND DEBRIDEMENT, AND WOUND VAC APPLICATION;  Surgeon: Marianna Payment, MD;  Location: [facility];  Service: Orthopedics;  Laterality: Left;  . I & D EXTREMITY Left 10/22/2012   Procedure: IRRIGATION AND DEBRIDEMENT EXTREMITY;  Surgeon: Marianna Payment, MD;  [facility];  Service: Orthopedics;  Laterality: Left;  . INGUINAL HERNIA REPAIR Left yrs ago  . LAPAROSCOPIC ASSISTED VENTRAL HERNIA REPAIR  08-08-2009   dr Excell Seltzer   AND OPEN REPAIR RECURRENT LEFT INGUINAL HERNIA  . LAPAROSCOPIC INGUINAL HERNIA REPAIR Left 07-24-1999    dr Excell Seltzer  . OPEN VENTRAL HERNIA REPAIR /  LYSIS ADHESIONS  09-23-2010    dr Donne Hazel  . ORIF ELBOW FRACTURE Left 03/29/2015   Procedure: LEFT ELBOW OPEN REDUCTION INTERNAL FIXATION (ORIF) DISTAL HUMERUS FRACTURE WITH OLECRANON OSTEOTOMY AND ULNAR NERVE RELEASE;  Surgeon: Roseanne Kaufman, MD;  Location: [facility];  Service: Orthopedics;  Laterality: Left;  . PPM GENERATOR CHANGEOUT N/A 02/10/2018   Procedure: PPM GENERATOR CHANGEOUT;  Surgeon: Deboraha Sprang, MD;  Location: [facility] CV LAB;  Service: Cardiovascular;  Laterality: N/A;  . REPAIR SUPRAUMBILICAL HERNIA   [id number]   dr Excell Seltzer  . SHOULDER ARTHROSCOPY WITH DISTAL CLAVICLE RESECTION Right 11-07-2007   dr Rhona Raider   [place] ACROMIOPLASTY  . SKIN SPLIT GRAFT Left 10/22/2012   Procedure: SKIN GRAFT SPLIT THICKNESS;  Surgeon: Marianna Payment, MD;  [facility];  Service: Orthopedics;  Laterality: Left;  . TOTAL KNEE ARTHROPLASTY Right 1990's    . TRANSTHORACIC ECHOCARDIOGRAM  06-12-2012   dr Irish Lack   ef 50-55%/ mild LAE/ mild MR and TR/  AV sclerosis without stenosis/  RVSP 70mmHg  . TRANSURETHRAL RESECTION OF BLADDER TUMOR N/A 07/15/2017   Procedure: CYSTOSCOPY TRANSURETHRAL RESECTION OF BLADDER TUMOR (TURBT);  Surgeon: Kathie Rhodes, MD;  Location: [facility];  Service: Urology;  Laterality: N/A;   Social History   Occupational History  . 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 Smoking status: Former Smoker    Packs/day: 1.50    Years: 35.00    Pack years: 52.50    Types: Cigarettes    Quit date: 02/15/1973    Years since quitting: 46.3  . Smokeless tobacco: Never Used  Substance and Sexual Activity  . Alcohol use: No  . Drug use: No  . Sexual activity: Never

## 2019-06-20 NOTE — Telephone Encounter (Signed)
Pt has an appt today.  

## 2019-06-22 ENCOUNTER — Encounter: Payer: Self-pay | Admitting: Pulmonary Disease

## 2019-06-22 ENCOUNTER — Other Ambulatory Visit: Payer: Self-pay

## 2019-06-22 ENCOUNTER — Ambulatory Visit (INDEPENDENT_AMBULATORY_CARE_PROVIDER_SITE_OTHER): Payer: Medicare Other | Admitting: Pulmonary Disease

## 2019-06-22 DIAGNOSIS — J432 Centrilobular emphysema: Secondary | ICD-10-CM | POA: Diagnosis not present

## 2019-06-22 DIAGNOSIS — G4733 Obstructive sleep apnea (adult) (pediatric): Secondary | ICD-10-CM

## 2019-06-22 DIAGNOSIS — I825Z1 Chronic embolism and thrombosis of unspecified deep veins of right distal lower extremity: Secondary | ICD-10-CM

## 2019-06-22 MED ORDER — PREDNISONE 10 MG PO TABS: ORAL_TABLET | ORAL | 0 refills | Status: DC

## 2019-06-22 NOTE — Progress Notes (Signed)
Virtual Visit via Telephone Note  I connected with [name] [name] on 06/22/19 at  9:30 AM EDT by telephone and verified that I am speaking with the correct person using two identifiers.  Location: Patient: Home Provider: [facility] - [id number], [place], [place], [place] [address]   I discussed the limitations, risks, security and privacy concerns of performing an evaluation and management service by telephone and the availability of in person appointments. I also discussed with the patient that there may be a patient responsible charge related to this service. The patient expressed understanding and agreed to proceed.  Patient consented to consult via telephone: Yes People present and their role in pt care: Pt     History of Present Illness:  86 year old male followed in our office for COPD and obstructive sleep apnea currently managed on CPAP  PMH: A. fib, GERD, DVT, hypertension, prostate cancer Smoker/ Smoking History: Former Smoker.  Quit in 1975.  52.5-pack-year smoking history Maintenance: Anoro Ellipta Pt of: [name]   Chief complaint: Short of breath    86 year old male former smoker followed in our office for emphysema as well as obstructive sleep apnea managed on CPAP.  Patient contacted our office because he said worsening shortness of breath over the last week.  He denies wheezing, fevers, chest pain, heart racing.  He reports adherence to his Eliquis as well as his Anoro Ellipta.  He is having to use his rescue inhaler twice a day.  He denies any weight increase or lower extremity swelling.  He was last treated with prednisone in February/2021 for suspected COPD exacerbation.  Patient reports that he has been using his CPAP regularly.  Over the past couple of days he feels like his mask is not fitting appropriately.  He plans on calling [facility] today.  Observations/Objective:  [place] 2015: FEV1 1.92 (63%), ratio 55  Social History   Tobacco  Use  Smoking Status Former Smoker  . Packs/day: 1.50  . Years: 35.00  . Pack years: 52.50  . Types: Cigarettes  . Quit date: 02/15/1973  . Years since quitting: 46.3  Smokeless Tobacco Never Used   Immunization History  Administered Date(s) Administered  . Fluad Quad(high Dose 65+) 11/02/2018  . Influenza Split 11/02/2015  . Influenza, High Dose Seasonal PF 11/14/2016, 11/07/2017  . Influenza,inj,Quad PF,6+ Mos 10/16/2012  . Influenza-Unspecified 11/15/2013  . Pneumococcal Conjugate-13 06/03/2014  . Pneumococcal-Unspecified 05/04/2013  . Tdap 10/05/2012  . Zoster 02/16/2011  . Zoster Recombinat (Shingrix) 04/15/2017, 06/15/2017      Assessment and Plan:  COPD (chronic obstructive pulmonary disease) with emphysema (HCC) Increase shortness of breath for 1 week Denies wheezing Denies chest pain or heart racing Continues to be maintained on Eliquis Reports adherence to his inhalers Laurence Ferrari use 2 times a day over the last week  Plan: Prednisone taper today Continue Anoro Ellipta Continue rescue inhaler Close follow-up for in office evaluation to ensure exacerbation has resolved Encourage patient to contact our office if symptoms or not improving with these interventions  OSA (obstructive sleep apnea) Patient reports adherence to CPAP therapy Patient reports over the past couple days has had difficulty with his mask We offered a order to coordinate follow-up from [place] [place], patient declined and states he will call them today  Plan: Continue CPAP 6-week follow-up with [name]  DVT, lower extremity, distal, chronic ([place]) Plan: Continue Eliquis   Follow Up Instructions:  Return in about 6 weeks (around 08/03/2019), or if  symptoms worsen or fail to improve, for Follow up with [name].   I discussed the assessment and treatment plan with the patient. The patient was provided an opportunity to ask questions and all were answered. The patient agreed with the  plan and demonstrated an understanding of the instructions.   The patient was advised to call back or seek an in-person evaluation if the symptoms worsen or if the condition fails to improve as anticipated.  I provided 22 minutes of non-face-to-face time during this encounter.   Lauraine Rinne, NP

## 2019-06-22 NOTE — Assessment & Plan Note (Signed)
Plan: Continue Eliquis 

## 2019-06-22 NOTE — Assessment & Plan Note (Signed)
Patient reports adherence to CPAP therapy Patient reports over the past couple days has had difficulty with his mask We offered a order to coordinate follow-up from [facility] [place], patient declined and states he will call them today  Plan: Continue CPAP 6-week follow-up with [name]

## 2019-06-22 NOTE — Assessment & Plan Note (Signed)
Increase shortness of breath for 1 week Denies wheezing Denies chest pain or heart racing Continues to be maintained on Eliquis Reports adherence to his inhalers Laurence Ferrari use 2 times a day over the last week  Plan: Prednisone taper today Continue Anoro Ellipta Continue rescue inhaler Close follow-up for in office evaluation to ensure exacerbation has resolved Encourage patient to contact our office if symptoms or not improving with these interventions

## 2019-06-22 NOTE — Patient Instructions (Addendum)
You were seen today by Lauraine Rinne, NP  for:   1. Centrilobular emphysema (HCC)  - predniSONE (DELTASONE) 10 MG tablet; 4 tabs for 2 days, then 3 tabs for 2 days, 2 tabs for 2 days, then 1 tab for 2 days, then stop  Dispense: 20 tablet; Refill: 0  Anoro Ellipta  >>> Take 1 puff daily in the morning right when you wake up >>>Rinse your mouth out after use >>>This is a daily maintenance inhaler, NOT a rescue inhaler >>>Contact our office if you are having difficulties affording or obtaining this medication >>>It is important for you to be able to take this daily and not miss any doses    Only use your albuterol as a rescue medication to be used if you can't catch your breath by resting or doing a relaxed purse lip breathing pattern.  - The less you use it, the better it will work when you need it. - Ok to use up to 2 puffs  every 4 hours if you must but call for immediate appointment if use goes up over your usual need - Don't leave home without it !!  (think of it like the spare tire for your car)   If symptoms or not improving despite the prednisone taper please contact our office  2. OSA (obstructive sleep apnea)  We recommend that you continue using your CPAP daily >>>Keep up the hard work using your device >>> Goal should be wearing this for the entire night that you are sleeping, at least 4 to 6 hours  Remember:  . Do not drive or operate heavy machinery if tired or drowsy.  . Please notify the supply company and office if you are unable to use your device regularly due to missing supplies or machine being broken.  . Work on maintaining a healthy weight and following your recommended nutrition plan  . Maintain proper daily exercise and movement  . Maintaining proper use of your device can also help improve management of other chronic illnesses such as: Blood pressure, blood sugars, and weight management.   BiPAP/ CPAP Cleaning:  >>>Clean weekly, with Dawn soap, and bottle  brush.  Set up to air dry. >>> Wipe mask out daily with wet wipe or towelette   3. Chronic deep vein thrombosis (DVT) of distal vein of right lower extremity (HCC)  Continue Eliquis   We recommend today:  No orders of the defined types were placed in this encounter.  No orders of the defined types were placed in this encounter.  Meds ordered this encounter  Medications  . predniSONE (DELTASONE) 10 MG tablet    Sig: 4 tabs for 2 days, then 3 tabs for 2 days, 2 tabs for 2 days, then 1 tab for 2 days, then stop    Dispense:  20 tablet    Refill:  0    Follow Up:    Return in about 6 weeks (around 08/03/2019), or if symptoms worsen or fail to improve, for Follow up with [name].   Please do your part to reduce the spread of COVID-19:      Reduce your risk of any infection  and COVID19 by using the similar precautions used for avoiding the common cold or flu:  [place] Kitchen Wash your hands often with soap and warm water for at least 20 seconds.  If soap and water are not readily available, use an alcohol-based hand sanitizer with at least 60% alcohol.  . If coughing or sneezing,  cover your mouth and nose by coughing or sneezing into the elbow areas of your shirt or coat, into a tissue or into your sleeve (not your hands). Langley Gauss A MASK when in public  . Avoid shaking hands with others and consider head nods or verbal greetings only. . Avoid touching your eyes, nose, or mouth with unwashed hands.  . Avoid close contact with people who are sick. . Avoid places or events with large numbers of people in one location, like concerts or sporting events. . If you have some symptoms but not all symptoms, continue to monitor at home and seek medical attention if your symptoms worsen. . If you are having a medical emergency, call 911.   [place] / e-Visit: [email or web address]         [facility]: [facility]: [id number]                   [facility]: [id number]     It is flu season:   >>> Best ways to protect herself from the flu: Receive the yearly flu vaccine, practice good hand hygiene washing with soap and also using hand sanitizer when available, eat a nutritious meals, get adequate rest, hydrate appropriately   Please contact the office if your symptoms worsen or you have concerns that you are not improving.   Thank you for choosing [facility] for your healthcare, and for allowing [place] to partner with you on your healthcare journey. I am thankful to be able to provide care to you today.   Wyn Quaker FNP-C

## 2019-06-25 ENCOUNTER — Ambulatory Visit: Payer: Medicare Other | Admitting: Neurology

## 2019-06-25 DIAGNOSIS — I878 Other specified disorders of veins: Secondary | ICD-10-CM | POA: Diagnosis not present

## 2019-06-25 DIAGNOSIS — R600 Localized edema: Secondary | ICD-10-CM | POA: Diagnosis not present

## 2019-06-25 DIAGNOSIS — S41119A Laceration without foreign body of unspecified upper arm, initial encounter: Secondary | ICD-10-CM | POA: Diagnosis not present

## 2019-06-25 DIAGNOSIS — L97519 Non-pressure chronic ulcer of other part of right foot with unspecified severity: Secondary | ICD-10-CM | POA: Diagnosis not present

## 2019-06-26 ENCOUNTER — Telehealth: Payer: Self-pay | Admitting: Pulmonary Disease

## 2019-06-26 NOTE — Progress Notes (Signed)
[facility]    Provider:  Dr Jaynee Eagles Requesting Provider: Lawerance Cruel, MD Primary Care Provider:  Lawerance Cruel, MD  CC:  [name] [name]  HPI:  [name] [name] is a 85 y.o. male here as requested by Lawerance Cruel, MD for falls. 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  I reviewed neurology notes from over 3 years ago he was seen for dizziness, walking difficulties due to lumbosacral spinal stenosis and right leg weakness, peripheral neuropathy diagnosed on nerve conduction study in 2012 with a moderate severe neuropathy at the time, associated gait instability, using a quad cane for ambulation, also dizziness but no true vertigo.  At that time the patient had good strength in all 4 extremities, decreased right arm and right leg sensory, no dysmetria or ataxia, but his gait was slightly wide-based Romberg negative, dragging the right leg exam, deep tendon reflexes were symmetrical.  Gait instability was due to the above including spinal stenosis, chronic low back pain, peripheral neuropathy.  The patient also had residual weakness of the left hand following a fall in 2017, numbness and weakness in the left hand and also had left carpal tunnel surgery in the past.  He was seen and evaluated in the past by neurology for dizziness, peripheral neuropathy, gait instability with a history of lumbar spinal stenosis -gait instability thought to be multifactorial including peripheral neuropathy and history of lumbar spinal stenosis, dizziness started as early as 2015 or earlier.  Physical therapy in the past did not help his ambulation.  Per Dr. Alan Ripper notes, patient requested a repeat evaluation due to his worsening  imbalance, it appears he has pain management with Dr. Hardin Negus, he seen rheumatologist Dr. Dossie Der and his ophthalmologist is at the [place], he is a former smoker 1 to 3 packs daily for 30 years, he was also exposed to passive smoke, no alcohol, no caffeine, experiencing weight loss recently, served in [employer] at the end of her work 2.  Patient here with wife. He has had a balance problem for "a good while" worse in the morning. He has to hold onto something. He has weakness in hs knees and imbalance. No focal weakness. He can;t raise his legs to get into the bed. No weakness in the arms. He needs a walker or he can't walk at all. He fell in the kitchen and hit the counter top. He has low back pain and stenosis in his back. He also has neck pain shooting into the arms. No tremor. Also imbalance he can barely walk, like a snail's pace. No tremor. No problems sleeping, he has chronic dizziness when he stands up, he has tremendous swelling in the legs, he has lost weight and muscle mass in the last year. No problems swallowing or facial weakness. No speech problems, no loss of smell or taste, numbness in the feet. Significant decline within the last 3 months, wife provides much information. No double vision or droopy eyelids.    Reviewed notes, labs and imaging from outside physicians, which showed:   I reviewed notes which includes CBC collected April 23, 2019 with anemia 12.6/38.3, elevated MCV at 105, platelets 173, BMP collected March 09, 2019 showed BUN 26, creatinine 1.24 otherwise unremarkable.  TSH 1.26 collected March 09, 2019, folate 10 March 10, 2019  CT of the lumbar spine 05/24/2019: I reviewed the report, patient has unchanged severe spinal stenosis and multilevel degenerative changes including multilevel foraminal stenosis at places severe, facet hypertrophy, diffuse osteopenia, severe dextroscoliosis; advanced multilevel disc and facet degeneration with chronic severe spinal stenosis.  Review of  Systems: Patient complains of symptoms per HPI as well as the following symptoms: weakness, physical decline. Pertinent negatives and positives per HPI. All others negative.   Social History   Socioeconomic History  . Marital status: Married    Spouse name: martha  . Number of children: 3  . Years of education: 40  . Highest education level: 12th grade  Occupational History  . 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 Smoking status: Former Smoker    Packs/day: 1.50    Years: 35.00    Pack years: 52.50    Types: Cigarettes    Quit date: 02/15/1973    Years since quitting: 46.3  . Smokeless tobacco: Never Used  Substance and Sexual Activity  . Alcohol use: No  . Drug use: No  . Sexual activity: Never  Other Topics Concern  . Not on file  Social History Narrative   Lives at home w/ his wife, [name]   Right-handed   Drinks about 1 soda per day, decaf coffee   Social Determinants of Health   Financial Resource Strain:   . Difficulty of Paying Living Expenses:   Food Insecurity:   . Worried About Charity fundraiser in the Last Year:   . Arboriculturist in the Last Year:   Transportation Needs:   . Film/video editor (Medical):   Marland Kitchen Lack of Transportation (Non-Medical):   Physical Activity:   . Days of Exercise per Week:   . Minutes of Exercise per Session:   Stress:   . Feeling of Stress :   Social Connections:   . Frequency of Communication with Friends and Family:   . Frequency of Social Gatherings with Friends and Family:   . Attends Religious Services:   . Active Member of Clubs or Organizations:   . Attends Archivist Meetings:   Marland Kitchen Marital Status:   Intimate Partner Violence:   . Fear of Current or Ex-Partner:   . Emotionally Abused:   Marland Kitchen Physically Abused:   . Sexually Abused:     Family History  Problem Relation Age of Onset  . Heart disease Father   . Heart disease Mother   . Colon  cancer Mother   . Stroke Mother   . Cancer Sister   . Heart disease Sister   . Heart attack Neg Hx   . Hypertension Neg Hx     Past Medical History:  Diagnosis Date  . Abdominal hernia   . Abnormality of gait    due to low back pain , uses  cane and walker  . Anemia of chronic disease    Dr. Alen Blew  . Arthritis   . Bilateral lower extremity edema    wears TED hose  . Cancer [facility])    prostate- treated with radiation  . Cardiac pacemaker in situ 10/11/2006   medtronic  . Chronic iron deficiency anemia oncologist/ hematoloist-- dr Alen Blew   treatment IV Iron infusions  . Chronic low back pain   . CKD (chronic kidney disease), stage III   . COPD with emphysema [facility])    pulmologist-  dr Elsworth Soho  .  DDD (degenerative disc disease), lumbosacral   . Degenerative scoliosis   . Dyspnea    with exertion  . Full dentures   . GERD (gastroesophageal reflux disease)   . Gross hematuria   . Headache    history of migraines- "way in the past"  . Hereditary and idiopathic peripheral neuropathy    bilateral lower leg  . Hiatal hernia   . History of DVT of lower extremity 10/19/2011   chronic distal popliteal right lower extremity  . History of external beam radiation therapy 2008   prostate cancer  . History of pancreatitis 2013  . History of pneumothorax 09/2006   iatrogenic-- in setting cardiac pacemaker placement  . History of prostate cancer 2008   s/p  external radiation therapy  . Hypertension   . Injury of left ulnar nerve 06/2015   Dr. Jannifer Franklin   . Left carpal tunnel syndrome   . Lesion of bladder   . Macular degeneration of both eyes   . Macular degeneration of right eye   . Mild mitral regurgitation   . Mild mitral regurgitation    and trace AI 07/22/08 echo and 10/2010  . Mild pulmonary hypertension ([place])   . Nocturia   . OSA on CPAP   . PAF (paroxysmal atrial fibrillation) ([place]) 10/2011   1st episode documented 09/ 2013 admission for gallstons/ pancreatitis and prior to  cholecystectomy , went back in NSR without interventeion  . Pancreatitis 10/2011   gallstone induced  . Presence of permanent cardiac pacemaker   . Prostate cancer ([place])    Dr. Karsten Ro  . Sinus node dysfunction ([facility])    s/p  cardiac pacemaker placement 10-11-2006  . Sinus node dysfunction ([facility])    pacemaker  . Spinal stenosis    L2  . Venous insufficiency   . Wears glasses   . Wears hearing aid in both ears     Patient Active Problem List   Diagnosis Date Noted  . History of cardioversion 04/29/2019  . Sacral fracture, closed ([place]) 04/13/2018  . PCO (posterior capsular opacification), bilateral 05/26/2017  . Weakness of right leg 04/20/2017  . Myelopathy concurrent with and due to stenosis of lumbar spine ([place]) 04/20/2017  . Exudative age-related macular degeneration of right eye with active choroidal neovascularization ([place]) 09/30/2016  . Pseudophakia of both eyes 09/30/2016  . Impingement syndrome of right shoulder 06/16/2016  . Impingement syndrome of left shoulder 06/16/2016  . Bilateral leg edema 06/14/2016  . Dizziness and giddiness 06/26/2015  . Abnormality of gait 06/26/2015  . Anxiety 04/14/2015  . GERD (gastroesophageal reflux disease) 04/03/2015  . OSA (obstructive sleep apnea) 04/03/2015  . HLD (hyperlipidemia) 04/03/2015  . SOB (shortness of breath)   . Left elbow fracture 03/29/2015  . Dizziness 01/01/2015  . LBP (low back pain) 01/01/2015  . Malignant neoplasm of prostate ([place]) 01/01/2015  . BP (high blood pressure) 01/01/2015  . DOE (dyspnea on exertion) 10/26/2013  . Essential hypertension, benign 10/26/2013  . Post-traumatic wound infection 10/17/2012  . Cellulitis 10/17/2012  . Atrial fibrillation ([place]) 10/22/2011  . DVT, lower extremity, distal, chronic ([place]) 10/22/2011  . Pacemaker 10/19/2011  . Prostate cancer ([place]) 10/19/2011  . COPD (chronic obstructive pulmonary disease) with emphysema ([place]) 02/04/2011  . Anemia 12/10/2010    Past Surgical  History:  Procedure Laterality Date  . BALLOON DILATION N/A 01/14/2015   Procedure: BALLOON DILATION;  Surgeon: Garlan Fair, MD;  Location: [facility];  Service: Endoscopy;  Laterality: N/A;  . BALLOON  DILATION N/A 10/12/2017   Procedure: BALLOON DILATION;  Surgeon: Otis Brace, MD;  Location: [facility];  Service: Gastroenterology;  Laterality: N/A;  . BIOPSY  10/12/2017   Procedure: BIOPSY;  Surgeon: Otis Brace, MD;  Location: [place] [facility];  Service: Gastroenterology;;  . CARDIAC CATHETERIZATION  08-29-2000   [place]   no significant obstructive CAD, intact globle LV size and systolic function with regional wall motion abnormalities noted,  ?coronary spasm producing wall motion abnormality, elevated CPK and chest pain (ef 55%)  . CARDIAC PACEMAKER PLACEMENT  10-11-2006    dr Leonia Reeves   W/  ATRIAL LEAD REVISION 10-12-2006   (medtronic)  . CARDIOVASCULAR STRESS TEST  11-18-2011    dr Irish Lack   Low risk nuclear study w/ no ishemia/  normal wall motion,  post-stress ef 48% (LVEF 56% on prior study 2008)  . CARDIOVERSION N/A 04/02/2019   Procedure: CARDIOVERSION;  Surgeon: Josue Hector, MD;  Location[facility];  Service: Cardiovascular;  Laterality: N/A;  . CARPAL TUNNEL RELEASE Left 11/14/2014   Procedure: CARPAL TUNNEL RELEASE LEFT THUMB;  Surgeon: Daryll Brod, MD;  Location: [place];  Service: Orthopedics;  Laterality: Left;  ANESTHESIA:  IV REGIONAL FAB  . CATARACT EXTRACTION W/ INTRAOCULAR LENS  IMPLANT, BILATERAL    . CHOLECYSTECTOMY  10/23/2011  . CHOLECYSTECTOMY  10/23/2011   Procedure: CHOLECYSTECTOMY;  Surgeon: Rolm Bookbinder, MD;  Location: [place];  Service: General;  Laterality: N/A;  with intraoperative cholangiogram  . COLONOSCOPY    . ESOPHAGOGASTRODUODENOSCOPY (EGD) WITH PROPOFOL N/A 01/14/2015   Procedure: ESOPHAGOGASTRODUODENOSCOPY (EGD) WITH PROPOFOL;  Surgeon: Garlan Fair, MD;  Location: [facility];  Service: Endoscopy;  Laterality:  N/A;  . ESOPHAGOGASTRODUODENOSCOPY (EGD) WITH PROPOFOL N/A 10/12/2017   Procedure: ESOPHAGOGASTRODUODENOSCOPY (EGD) WITH PROPOFOL;  Surgeon: Otis Brace, MD;  Location: [place];  Service: Gastroenterology;  Laterality: N/A;  . I & D EXTREMITY Left 10/20/2012   Procedure: LEFT LEG IRRIGATION AND DEBRIDEMENT, AND WOUND VAC APPLICATION;  Surgeon: Marianna Payment, MD;  Location: [facility];  Service: Orthopedics;  Laterality: Left;  . I & D EXTREMITY Left 10/22/2012   Procedure: IRRIGATION AND DEBRIDEMENT EXTREMITY;  Surgeon: Marianna Payment, MD;  Location: [facility];  Service: Orthopedics;  Laterality: Left;  . INGUINAL HERNIA REPAIR Left yrs ago  . LAPAROSCOPIC ASSISTED VENTRAL HERNIA REPAIR  08-08-2009   dr Excell Seltzer   AND OPEN REPAIR RECURRENT LEFT INGUINAL HERNIA  . LAPAROSCOPIC INGUINAL HERNIA REPAIR Left 07-24-1999    dr Excell Seltzer  . OPEN VENTRAL HERNIA REPAIR /  LYSIS ADHESIONS  09-23-2010    dr Donne Hazel  . ORIF ELBOW FRACTURE Left 03/29/2015   Procedure: LEFT ELBOW OPEN REDUCTION INTERNAL FIXATION (ORIF) DISTAL HUMERUS FRACTURE WITH OLECRANON OSTEOTOMY AND ULNAR NERVE RELEASE;  Surgeon: Roseanne Kaufman, MD;  Location: [place];  Service: Orthopedics;  Laterality: Left;  . PPM GENERATOR CHANGEOUT N/A 02/10/2018   Procedure: PPM GENERATOR CHANGEOUT;  Surgeon: Deboraha Sprang, MD;  Location: [facility] [facility];  Service: Cardiovascular;  Laterality: N/A;  . REPAIR SUPRAUMBILICAL HERNIA   [phone]   dr Excell Seltzer  . SHOULDER ARTHROSCOPY WITH DISTAL CLAVICLE RESECTION Right 11-07-2007   dr Rhona Raider   [place] [facility]  . SKIN SPLIT GRAFT Left 10/22/2012   Procedure: SKIN GRAFT SPLIT THICKNESS;  Surgeon: Marianna Payment, MD;  Location: [facility];  Service: Orthopedics;  Laterality: Left;  . TOTAL KNEE ARTHROPLASTY Right 1990's  . TRANSTHORACIC ECHOCARDIOGRAM  06-12-2012  dr Irish Lack   ef 50-55%/ mild LAE/ mild MR and TR/  AV sclerosis without stenosis/  RVSP 31mHg  .  TRANSURETHRAL RESECTION OF BLADDER TUMOR N/A 07/15/2017   Procedure: CYSTOSCOPY TRANSURETHRAL RESECTION OF BLADDER TUMOR (TURBT);  Surgeon: OKathie Rhodes MD;  [facility]  Service: Urology;  Laterality: N/A;    Current Outpatient Medications  Medication Sig Dispense Refill  . acetaminophen (TYLENOL) 650 MG CR tablet Take 650 mg by mouth 2 (two) times daily.    .Marland Kitchenalbuterol (PROVENTIL HFA;VENTOLIN HFA) 108 (90 Base) MCG/ACT inhaler Inhale 2 puffs into the lungs every 6 (six) hours as needed for wheezing or shortness of breath. 1 Inhaler 6  . apixaban (ELIQUIS) 5 MG TABS tablet Take 1 tablet (5 mg total) by mouth 2 (two) times daily. 60 tablet 5  . Calcium Carbonate-Vitamin D (CALCIUM-D PO) Take 1 tablet by mouth every evening.     . Cholecalciferol (VITAMIN D3 PO) Take 2,000 Units by mouth daily.    . diphenhydrAMINE (BENADRYL) 25 MG tablet Take 25 mg by mouth daily as needed for allergies.     . ferrous sulfate 325 (65 FE) MG tablet Take 650 mg by mouth daily with breakfast.    . fluticasone (FLONASE) 50 MCG/ACT nasal spray Place 1 spray into both nostrils daily as needed for allergies or rhinitis.    . furosemide (LASIX) 20 MG tablet Take 20-40 mg by mouth daily.     . mirabegron ER (MYRBETRIQ) 50 MG TB24 tablet Take 50 mg by mouth daily.    . Multiple Vitamins-Minerals (PRESERVISION AREDS 2 PO) Take 1 each by mouth in the morning and at bedtime.    .Marland KitchenoxyCODONE (OXY IR/ROXICODONE) 5 MG immediate release tablet Take 5 mg by mouth every 6 (six) hours as needed for severe pain.    .Marland Kitchenoxymetazoline (AFRIN) 0.05 % nasal spray Place 1 spray into both nostrils 2 (two) times daily as needed for congestion.    . pantoprazole (PROTONIX) 40 MG tablet Take 1 tablet (40 mg total) by mouth daily before breakfast. 90 tablet 1  . predniSONE (DELTASONE) 10 MG tablet 4 tabs for 2 days, then 3 tabs for 2 days, 2 tabs for 2 days, then 1 tab for 2 days, then stop 20 tablet 0  . psyllium  (METAMUCIL SMOOTH TEXTURE) 28 % packet Take 1 packet by mouth daily as needed. Mix with 8 oz liquid    . Umeclidinium-Vilanterol (ANORO ELLIPTA) 62.5-25 MCG/INH AEPB Inhale 1 puff into the lungs daily.     . vitamin B-12 (CYANOCOBALAMIN) 500 MCG tablet Take 500 mcg by mouth. Three days a week     No current facility-administered medications for this visit.    Allergies as of 06/27/2019  . (No Known Allergies)    Vitals: BP 107/78 (BP Location: Left Arm, Patient Position: Sitting)   Pulse 78 Comment: pt endorses irreguar HR; dinamap said 129, rechecked manual  Temp (!) 97.5 F (36.4 C) Comment: wife 97.1 taken at front  Ht 5' 10"  (1.778 m)   Wt 155 lb (70.3 kg) Comment: pt reports; in wheelchair  BMI 22.24 kg/m  Last Weight:  Wt Readings from Last 1 Encounters:  06/27/19 155 lb (70.3 kg)   Last Height:   Ht Readings from Last 1 Encounters:  06/27/19 5' 10"  (1.778 m)     Physical exam: Exam: Gen: NAD, conversant, frail  CV: irregular, no MRG. No Carotid Bruits. No peripheral edema, warm, nontender Eyes: Conjunctivae clear without exudates or hemorrhage  Neuro: Detailed Neurologic Exam  Speech:    Speech is normal; fluent and spontaneous with normal comprehension.  Cognition:    The patient is oriented to person, place, and time;     recent and remote memory appears intact;     language fluent;     normal attention, concentration,  fund of knowledge Cranial Nerves:    The pupils are equal, round, and reactive to light. Attempted fundoscopy could not visualize due to small pupils. Visual fields are full to finger confrontation. Extraocular movements are intact. Trigeminal sensation is intact and the muscles of mastication are normal. The face is symmetric. The palate elevates in the midline. Hearing intact. Voice is normal. Shoulder shrug is normal. The tongue has normal motion without fasciculations.   Coordination:    Normal finger to nose, cannot  perform with legs  Gait:    Attempted, could not stand independently  Motor Observation:    Global atrophy most significant distal FDIs Tone:    Normal muscle tone.    Posture:    Posture is stooped    Strength: 3+-4/5 throughout uppers, bilateral hip flexion 3/5, otherwise 3+-4+/5 in the lowers.        Sensation: decreased pin prick nd light touch lowers, absent vibration and proprioception     Reflex Exam:  DTR's: Absent AJs, brisk patellars and biceps  Toes:    The toes are equiv bilaterally, withdrawal but no frank babinski Clonus:    Clonus is absent.    Assessment/Plan: 86 year old with imbalance, weakness of legs, acute decline in gait, ait instability acutely in the last several months, he does have severe lumbar spinal stenosis, chronic low back pain, peripheral neuropathy(diagnosed by neurology in 2012) which may be the cause but unclear why the acte decline.   He was seen and evaluated in the past by neurology for dizziness, peripheral neuropathy, gait instability with a history of lumbar spinal stenosis -gait instability thought to be multifactorial including peripheral neuropathy and history of lumbar spinal stenosis, dizziness started as early as 2015 or earlier.  Physical therapy in the past did not help his ambulation.We need to evaluate for any other causes of patient's acute neurologic worsening.  CT of the head and cervical spine(he cannot have MRI) Labs today including CK Emg/ncs one arm and one leg  CT of the lumbar spine 05/24/2019: I reviewed the report, patient has unchanged severe spinal stenosis and multilevel degenerative changes including multilevel foraminal stenosis at places severe, facet hypertrophy, diffuse osteopenia, severe dextroscoliosis; advanced multilevel disc and facet degeneration with chronic severe spinal stenosis  Orders Placed This Encounter  Procedures  . CT HEAD WO CONTRAST  . CT CERVICAL SPINE WO CONTRAST  . CK  . B12 and Folate  Panel  . Heavy metals, blood  . Vitamin B6  . Multiple Myeloma Panel (SPEP&IFE w/QIG)  . Vitamin B1  . Methylmalonic acid, serum  . NCV with EMG(electromyography)    Cc: Lawerance Cruel, MD  Sarina Ill, MD  [facility] [address] [place],  [address]  Phone [phone] Fax [phone]  I spent 70 minutes of face-to-face and non-face-to-face time with patient on the  1. Weakness of both lower extremities   2. Cerebellar ataxia in diseases classified elsewhere ([place])   3. Imbalance   4. Fall, initial encounter   5. Progressive neurologic decline  6. Atrophy of muscle of lower leg, unspecified laterality    diagnosis.  This included previsit chart review, lab review, study review, order entry, electronic health record documentation, patient education on the different diagnostic and therapeutic options, counseling and coordination of care, risks and benefits of management, compliance, or risk factor reduction

## 2019-06-26 NOTE — Telephone Encounter (Signed)
I called pt but there was no answer. No VM to leave message. Will try again later.    (PROVENTIL HFA;VENTOLIN HFA) 108 (90 Base) MCG/ACT inhaler [id number]   Order Details Dose: 2 puff Route: Inhalation Frequency: Every 6 hours PRN for wheezing, shortness of breath  Dispense Quantity: 1 Inhaler Refills: 6       Sig: Inhale 2 puffs into the lungs every 6 (six) hours as needed for wheezing or shortness of breath.      Start Date: 09/19/15 End Date: --  Written Date: 09/19/15 Expiration Date: 09/18/16

## 2019-06-27 ENCOUNTER — Telehealth: Payer: Self-pay | Admitting: Neurology

## 2019-06-27 ENCOUNTER — Other Ambulatory Visit: Payer: Self-pay

## 2019-06-27 ENCOUNTER — Encounter: Payer: Self-pay | Admitting: Neurology

## 2019-06-27 ENCOUNTER — Ambulatory Visit (INDEPENDENT_AMBULATORY_CARE_PROVIDER_SITE_OTHER): Payer: Medicare Other | Admitting: Neurology

## 2019-06-27 VITALS — BP 107/78 | HR 78 | Temp 97.5°F | Ht 70.0 in | Wt 155.0 lb

## 2019-06-27 DIAGNOSIS — R29898 Other symptoms and signs involving the musculoskeletal system: Secondary | ICD-10-CM | POA: Diagnosis not present

## 2019-06-27 DIAGNOSIS — R2689 Other abnormalities of gait and mobility: Secondary | ICD-10-CM | POA: Diagnosis not present

## 2019-06-27 DIAGNOSIS — R29818 Other symptoms and signs involving the nervous system: Secondary | ICD-10-CM | POA: Diagnosis not present

## 2019-06-27 DIAGNOSIS — G3281 Cerebellar ataxia in diseases classified elsewhere: Secondary | ICD-10-CM | POA: Diagnosis not present

## 2019-06-27 DIAGNOSIS — M62569 Muscle wasting and atrophy, not elsewhere classified, unspecified lower leg: Secondary | ICD-10-CM | POA: Diagnosis not present

## 2019-06-27 DIAGNOSIS — W19XXXA Unspecified fall, initial encounter: Secondary | ICD-10-CM

## 2019-06-27 NOTE — Patient Instructions (Signed)
Cat Scan of the head and neck Blood Work EMG/NCS   Electromyoneurogram Electromyoneurogram is a test to check how well your muscles and nerves are working. This procedure includes the combined use of electromyogram (EMG) and nerve conduction study (NCS). EMG is used to look for muscular disorders. NCS, which is also called electroneurogram, measures how well your nerves are controlling your muscles. The procedures are usually done together to check if your muscles and nerves are healthy. If the results of the tests are abnormal, this may indicate disease or injury, such as a neuromuscular disease or peripheral nerve damage. Tell a health care provider about:  Any allergies you have.  All medicines you are taking, including vitamins, herbs, eye drops, creams, and over-the-counter medicines.  Any problems you or family members have had with anesthetic medicines.  Any blood disorders you have.  Any surgeries you have had.  Any medical conditions you have.  If you have a pacemaker.  Whether you are pregnant or may be pregnant. What are the risks? Generally, this is a safe procedure. However, problems may occur, including:  Infection where the electrodes were inserted.  Bleeding. What happens before the procedure? Medicines Ask your health care provider about:  Changing or stopping your regular medicines. This is especially important if you are taking diabetes medicines or blood thinners.  Taking medicines such as aspirin and ibuprofen. These medicines can thin your blood. Do not take these medicines unless your health care provider tells you to take them.  Taking over-the-counter medicines, vitamins, herbs, and supplements. General instructions  Your health care provider may ask you to avoid: ? Beverages that have caffeine, such as coffee and tea. ? Any products that contain nicotine or tobacco. These products include cigarettes, e-cigarettes, and chewing tobacco. If you need  help quitting, ask your health care provider.  Do not use lotions or creams on the same day that you will be having the procedure. What happens during the procedure? For EMG   Your health care provider will ask you to stay in a position so that he or she can access the muscle that will be studied. You may be standing, sitting, or lying down.  You may be given a medicine that numbs the area (local anesthetic).  A very thin needle that has an electrode will be inserted into your muscle.  Another small electrode will be placed on your skin near the muscle.  Your health care provider will ask you to continue to remain still.  The electrodes will send a signal that tells about the electrical activity of your muscles. You may see this on a monitor or hear it in the room.  After your muscles have been studied at rest, your health care provider will ask you to contract or flex your muscles. The electrodes will send a signal that tells about the electrical activity of your muscles.  Your health care provider will remove the electrodes and the electrode needles when the procedure is finished. The procedure may vary among health care providers and hospitals. For NCS   An electrode that records your nerve activity (recording electrode) will be placed on your skin by the muscle that is being studied.  An electrode that is used as a reference (reference electrode) will be placed near the recording electrode.  A paste or gel will be applied to your skin between the recording electrode and the reference electrode.  Your nerve will be stimulated with a mild shock. Your health care provider  will measure how much time it takes for your muscle to react.  Your health care provider will remove the electrodes and the gel when the procedure is finished. The procedure may vary among health care providers and hospitals. What happens after the procedure?  It is up to you to get the results of your  procedure. Ask your health care provider, or the department that is doing the procedure, when your results will be ready.  Your health care provider may: ? Give you medicines for any pain. ? Monitor the insertion sites to make sure that bleeding stops. Summary  Electromyoneurogram is a test to check how well your muscles and nerves are working.  If the results of the tests are abnormal, this may indicate disease or injury.  This is a safe procedure. However, problems may occur, such as bleeding and infection.  Your health care provider will do two tests to complete this procedure. One checks your muscles (EMG) and another checks your nerves (NCS).  It is up to you to get the results of your procedure. Ask your health care provider, or the department that is doing the procedure, when your results will be ready. This information is not intended to replace advice given to you by your health care provider. Make sure you discuss any questions you have with your health care provider. Document Revised: 10/18/2017 Document Reviewed: 09/30/2017 Elsevier Patient Education  [place].

## 2019-06-27 NOTE — Telephone Encounter (Signed)
I spoke with Mrs. Rivernbark. She spoke with the patient and they accepted the first available EMG appt on 07/12/19 arrive 7:30 AM. Her questions were answered. She was advised for pt to come with clean skin free of lotion. She verbalized appreciation for the call.

## 2019-06-27 NOTE — Telephone Encounter (Signed)
Working on 5/27 AM.

## 2019-06-27 NOTE — Telephone Encounter (Signed)
Called and spoke with patient's wife per DPR and let her know when to use albuterol rescue inhaler. She expressed understanding. Nothing further needed at this time.

## 2019-06-27 NOTE — Telephone Encounter (Signed)
Patient needs an emg of 2 limbs, he is 77 and quite frail so I thought we may need 2 spots; also in case we need to do more limbs. Where can we place him?

## 2019-06-27 NOTE — Telephone Encounter (Signed)
Medicare/aarp order sent to [facility]. No auth they will reach out to the patient to schedule.  

## 2019-06-28 ENCOUNTER — Ambulatory Visit: Payer: Medicare Other | Admitting: Orthopedic Surgery

## 2019-06-28 NOTE — Telephone Encounter (Signed)
Noted, thanks!

## 2019-07-03 LAB — MULTIPLE MYELOMA PANEL, SERUM
Albumin SerPl Elph-Mcnc: 3.5 g/dL (ref 2.9–4.4)
Albumin/Glob SerPl: 1.6 (ref 0.7–1.7)
Alpha 1: 0.3 g/dL (ref 0.0–0.4)
Alpha2 Glob SerPl Elph-Mcnc: 0.6 g/dL (ref 0.4–1.0)
B-Globulin SerPl Elph-Mcnc: 0.8 g/dL (ref 0.7–1.3)
Gamma Glob SerPl Elph-Mcnc: 0.6 g/dL (ref 0.4–1.8)
Globulin, Total: 2.3 g/dL (ref 2.2–3.9)
IgA/Immunoglobulin A, Serum: 147 mg/dL (ref 61–437)
IgG (Immunoglobin G), Serum: 545 mg/dL — ABNORMAL LOW (ref 603–1613)
IgM (Immunoglobulin M), Srm: 167 mg/dL — ABNORMAL HIGH (ref 15–143)
Total Protein: 5.8 g/dL — ABNORMAL LOW (ref 6.0–8.5)

## 2019-07-03 LAB — B12 AND FOLATE PANEL
Folate: 10.4 ng/mL (ref >3.0)
Vitamin B-12: 707 pg/mL (ref 232–1245)

## 2019-07-03 LAB — METHYLMALONIC ACID, SERUM: Methylmalonic Acid: 340 nmol/L (ref 0–378)

## 2019-07-03 LAB — VITAMIN B6: Vitamin B6: 2.5 ug/L — ABNORMAL LOW (ref 5.3–46.7)

## 2019-07-03 LAB — HEAVY METALS, BLOOD
Arsenic: 5 ug/L (ref 2–23)
Lead, Blood: <1 ug/dL (ref 0–4)
Mercury: <1 ug/L (ref 0.0–14.9)

## 2019-07-03 LAB — VITAMIN B1: Thiamine: 109.6 nmol/L (ref 66.5–200.0)

## 2019-07-03 LAB — CK: Total CK: 51 U/L (ref 30–208)

## 2019-07-04 ENCOUNTER — Telehealth: Payer: Self-pay | Admitting: *Deleted

## 2019-07-04 MED ORDER — FOLIC ACID-VIT B6-VIT B12 2.5-25-1 MG PO TABS: 1.0000 | ORAL_TABLET | Freq: Every day | ORAL | 0 refills | Status: AC

## 2019-07-04 NOTE — Telephone Encounter (Signed)
-----   Message from Melvenia Beam, MD sent at 07/02/2019  5:36 PM EDT ----- He has vitamin B6 deficiency which can cause neuropathy. I would like to prescribe the foltx. I'm not sure why he has b6 deficiency, it is a little unusual but one cause could be his kidney disease but still I don;t see it often. B6 deficiency can also be a factor in anemia, and he is anemic. If he is ok I will send in the prescription and we can discuss at his emg/ncs. We should also send the results to his pcp. thnks

## 2019-07-04 NOTE — Telephone Encounter (Addendum)
I called the pt's wife and spoke with her about the results noted below, Vitamin B6 deficiency. She is amenable to patient started Foltx for supplementation and is aware this is a prescription once a day oral tablet that is a B complex. She asked for the lab results to be sent to them in the mail. While on the call, the pt's wife stated the patient has gotten worse with his walking/balance and is in a wheelchair today. He used it some yesterday. I advised her that with this change we do recommend he go to the emergency department. The wife thanked me for the recommendation but said they would wait there for a day or two and see how he does. She mentioned no further questions or concerns. EMG is pending. There is nothing sooner as of now on Dr. Cathren Laine schedule. I sent the lab results to Dr. Harrington Challenger (PCP).

## 2019-07-10 ENCOUNTER — Other Ambulatory Visit: Payer: Self-pay

## 2019-07-10 ENCOUNTER — Inpatient Hospital Stay (HOSPITAL_COMMUNITY)
Admission: EM | Admit: 2019-07-10 | Discharge: 2019-07-25 | DRG: 291 | Disposition: A | Discharge status: Skilled Nursing Facility | Payer: Medicare Other | Attending: Internal Medicine | Admitting: Internal Medicine

## 2019-07-10 ENCOUNTER — Emergency Department (HOSPITAL_COMMUNITY): Payer: Medicare Other

## 2019-07-10 ENCOUNTER — Encounter (HOSPITAL_COMMUNITY): Payer: Self-pay

## 2019-07-10 DIAGNOSIS — I495 Sick sinus syndrome: Secondary | ICD-10-CM | POA: Diagnosis present

## 2019-07-10 DIAGNOSIS — Z515 Encounter for palliative care: Secondary | ICD-10-CM

## 2019-07-10 DIAGNOSIS — Z974 Presence of external hearing-aid: Secondary | ICD-10-CM

## 2019-07-10 DIAGNOSIS — M5489 Other dorsalgia: Secondary | ICD-10-CM | POA: Diagnosis not present

## 2019-07-10 DIAGNOSIS — M48061 Spinal stenosis, lumbar region without neurogenic claudication: Secondary | ICD-10-CM | POA: Diagnosis not present

## 2019-07-10 DIAGNOSIS — Z79899 Other long term (current) drug therapy: Secondary | ICD-10-CM

## 2019-07-10 DIAGNOSIS — Z86718 Personal history of other venous thrombosis and embolism: Secondary | ICD-10-CM

## 2019-07-10 DIAGNOSIS — I4891 Unspecified atrial fibrillation: Secondary | ICD-10-CM | POA: Diagnosis not present

## 2019-07-10 DIAGNOSIS — X58XXXA Exposure to other specified factors, initial encounter: Secondary | ICD-10-CM | POA: Diagnosis present

## 2019-07-10 DIAGNOSIS — M545 Low back pain: Secondary | ICD-10-CM | POA: Diagnosis present

## 2019-07-10 DIAGNOSIS — Z923 Personal history of irradiation: Secondary | ICD-10-CM

## 2019-07-10 DIAGNOSIS — Z8249 Family history of ischemic heart disease and other diseases of the circulatory system: Secondary | ICD-10-CM

## 2019-07-10 DIAGNOSIS — I480 Paroxysmal atrial fibrillation: Secondary | ICD-10-CM | POA: Diagnosis not present

## 2019-07-10 DIAGNOSIS — J439 Emphysema, unspecified: Secondary | ICD-10-CM | POA: Diagnosis present

## 2019-07-10 DIAGNOSIS — Z20822 Contact with and (suspected) exposure to covid-19: Secondary | ICD-10-CM | POA: Diagnosis present

## 2019-07-10 DIAGNOSIS — Z9181 History of falling: Secondary | ICD-10-CM

## 2019-07-10 DIAGNOSIS — I509 Heart failure, unspecified: Secondary | ICD-10-CM | POA: Diagnosis not present

## 2019-07-10 DIAGNOSIS — M5137 Other intervertebral disc degeneration, lumbosacral region: Secondary | ICD-10-CM | POA: Diagnosis present

## 2019-07-10 DIAGNOSIS — L89106 Pressure-induced deep tissue damage of unspecified part of back: Secondary | ICD-10-CM | POA: Diagnosis not present

## 2019-07-10 DIAGNOSIS — R7989 Other specified abnormal findings of blood chemistry: Secondary | ICD-10-CM | POA: Diagnosis present

## 2019-07-10 DIAGNOSIS — K219 Gastro-esophageal reflux disease without esophagitis: Secondary | ICD-10-CM | POA: Diagnosis present

## 2019-07-10 DIAGNOSIS — I4819 Other persistent atrial fibrillation: Secondary | ICD-10-CM | POA: Diagnosis present

## 2019-07-10 DIAGNOSIS — G609 Hereditary and idiopathic neuropathy, unspecified: Secondary | ICD-10-CM | POA: Diagnosis present

## 2019-07-10 DIAGNOSIS — I080 Rheumatic disorders of both mitral and aortic valves: Secondary | ICD-10-CM | POA: Diagnosis present

## 2019-07-10 DIAGNOSIS — N1832 Chronic kidney disease, stage 3b: Secondary | ICD-10-CM | POA: Diagnosis present

## 2019-07-10 DIAGNOSIS — M1990 Unspecified osteoarthritis, unspecified site: Secondary | ICD-10-CM | POA: Diagnosis present

## 2019-07-10 DIAGNOSIS — S81812A Laceration without foreign body, left lower leg, initial encounter: Secondary | ICD-10-CM | POA: Diagnosis present

## 2019-07-10 DIAGNOSIS — J449 Chronic obstructive pulmonary disease, unspecified: Secondary | ICD-10-CM | POA: Diagnosis not present

## 2019-07-10 DIAGNOSIS — R2681 Unsteadiness on feet: Secondary | ICD-10-CM | POA: Diagnosis not present

## 2019-07-10 DIAGNOSIS — H9193 Unspecified hearing loss, bilateral: Secondary | ICD-10-CM | POA: Diagnosis present

## 2019-07-10 DIAGNOSIS — R778 Other specified abnormalities of plasma proteins: Secondary | ICD-10-CM | POA: Diagnosis not present

## 2019-07-10 DIAGNOSIS — Z7189 Other specified counseling: Secondary | ICD-10-CM | POA: Diagnosis not present

## 2019-07-10 DIAGNOSIS — M6281 Muscle weakness (generalized): Secondary | ICD-10-CM | POA: Diagnosis not present

## 2019-07-10 DIAGNOSIS — I130 Hypertensive heart and chronic kidney disease with heart failure and stage 1 through stage 4 chronic kidney disease, or unspecified chronic kidney disease: Principal | ICD-10-CM | POA: Diagnosis present

## 2019-07-10 DIAGNOSIS — R609 Edema, unspecified: Secondary | ICD-10-CM | POA: Diagnosis not present

## 2019-07-10 DIAGNOSIS — N179 Acute kidney failure, unspecified: Secondary | ICD-10-CM | POA: Diagnosis present

## 2019-07-10 DIAGNOSIS — Z7901 Long term (current) use of anticoagulants: Secondary | ICD-10-CM

## 2019-07-10 DIAGNOSIS — I429 Cardiomyopathy, unspecified: Secondary | ICD-10-CM | POA: Diagnosis present

## 2019-07-10 DIAGNOSIS — G4733 Obstructive sleep apnea (adult) (pediatric): Secondary | ICD-10-CM | POA: Diagnosis present

## 2019-07-10 DIAGNOSIS — Z66 Do not resuscitate: Secondary | ICD-10-CM | POA: Diagnosis present

## 2019-07-10 DIAGNOSIS — I5043 Acute on chronic combined systolic (congestive) and diastolic (congestive) heart failure: Secondary | ICD-10-CM | POA: Diagnosis present

## 2019-07-10 DIAGNOSIS — R000 Tachycardia, unspecified: Secondary | ICD-10-CM | POA: Diagnosis not present

## 2019-07-10 DIAGNOSIS — Z6823 Body mass index (BMI) 23.0-23.9, adult: Secondary | ICD-10-CM

## 2019-07-10 DIAGNOSIS — Z8546 Personal history of malignant neoplasm of prostate: Secondary | ICD-10-CM

## 2019-07-10 DIAGNOSIS — K08109 Complete loss of teeth, unspecified cause, unspecified class: Secondary | ICD-10-CM | POA: Diagnosis present

## 2019-07-10 DIAGNOSIS — Z950 Presence of cardiac pacemaker: Secondary | ICD-10-CM

## 2019-07-10 DIAGNOSIS — D631 Anemia in chronic kidney disease: Secondary | ICD-10-CM | POA: Diagnosis present

## 2019-07-10 DIAGNOSIS — H3530 Unspecified macular degeneration: Secondary | ICD-10-CM | POA: Diagnosis present

## 2019-07-10 DIAGNOSIS — M4180 Other forms of scoliosis, site unspecified: Secondary | ICD-10-CM | POA: Diagnosis present

## 2019-07-10 DIAGNOSIS — I272 Pulmonary hypertension, unspecified: Secondary | ICD-10-CM | POA: Diagnosis present

## 2019-07-10 DIAGNOSIS — I5041 Acute combined systolic (congestive) and diastolic (congestive) heart failure: Secondary | ICD-10-CM | POA: Diagnosis not present

## 2019-07-10 DIAGNOSIS — I129 Hypertensive chronic kidney disease with stage 1 through stage 4 chronic kidney disease, or unspecified chronic kidney disease: Secondary | ICD-10-CM | POA: Diagnosis not present

## 2019-07-10 DIAGNOSIS — I951 Orthostatic hypotension: Secondary | ICD-10-CM | POA: Diagnosis not present

## 2019-07-10 DIAGNOSIS — I248 Other forms of acute ischemic heart disease: Secondary | ICD-10-CM | POA: Diagnosis present

## 2019-07-10 DIAGNOSIS — R64 Cachexia: Secondary | ICD-10-CM | POA: Diagnosis present

## 2019-07-10 DIAGNOSIS — Z972 Presence of dental prosthetic device (complete) (partial): Secondary | ICD-10-CM

## 2019-07-10 DIAGNOSIS — M4800 Spinal stenosis, site unspecified: Secondary | ICD-10-CM | POA: Diagnosis present

## 2019-07-10 DIAGNOSIS — M7989 Other specified soft tissue disorders: Secondary | ICD-10-CM | POA: Diagnosis not present

## 2019-07-10 DIAGNOSIS — I5021 Acute systolic (congestive) heart failure: Secondary | ICD-10-CM | POA: Diagnosis not present

## 2019-07-10 DIAGNOSIS — R0602 Shortness of breath: Secondary | ICD-10-CM | POA: Diagnosis not present

## 2019-07-10 DIAGNOSIS — I959 Hypotension, unspecified: Secondary | ICD-10-CM | POA: Diagnosis present

## 2019-07-10 DIAGNOSIS — R54 Age-related physical debility: Secondary | ICD-10-CM | POA: Diagnosis present

## 2019-07-10 DIAGNOSIS — Z87891 Personal history of nicotine dependence: Secondary | ICD-10-CM

## 2019-07-10 DIAGNOSIS — I340 Nonrheumatic mitral (valve) insufficiency: Secondary | ICD-10-CM | POA: Diagnosis not present

## 2019-07-10 DIAGNOSIS — R531 Weakness: Secondary | ICD-10-CM

## 2019-07-10 DIAGNOSIS — R600 Localized edema: Secondary | ICD-10-CM | POA: Diagnosis not present

## 2019-07-10 DIAGNOSIS — I110 Hypertensive heart disease with heart failure: Secondary | ICD-10-CM | POA: Diagnosis not present

## 2019-07-10 DIAGNOSIS — G8929 Other chronic pain: Secondary | ICD-10-CM | POA: Diagnosis present

## 2019-07-10 DIAGNOSIS — I350 Nonrheumatic aortic (valve) stenosis: Secondary | ICD-10-CM | POA: Diagnosis not present

## 2019-07-10 DIAGNOSIS — R488 Other symbolic dysfunctions: Secondary | ICD-10-CM | POA: Diagnosis not present

## 2019-07-10 DIAGNOSIS — D509 Iron deficiency anemia, unspecified: Secondary | ICD-10-CM | POA: Diagnosis not present

## 2019-07-10 DIAGNOSIS — G992 Myelopathy in diseases classified elsewhere: Secondary | ICD-10-CM | POA: Diagnosis not present

## 2019-07-10 DIAGNOSIS — I361 Nonrheumatic tricuspid (valve) insufficiency: Secondary | ICD-10-CM | POA: Diagnosis not present

## 2019-07-10 LAB — URINALYSIS, ROUTINE W REFLEX MICROSCOPIC
Bacteria, UA: NONE SEEN
Bilirubin Urine: NEGATIVE
Glucose, UA: NEGATIVE mg/dL
Hgb urine dipstick: NEGATIVE
Ketones, ur: NEGATIVE mg/dL
Leukocytes,Ua: NEGATIVE
Nitrite: NEGATIVE
Protein, ur: 100 mg/dL — AB
Specific Gravity, Urine: 1.027 (ref 1.005–1.030)
pH: 5 (ref 5.0–8.0)

## 2019-07-10 LAB — COMPREHENSIVE METABOLIC PANEL
ALT: 30 U/L (ref 0–44)
AST: 38 U/L (ref 15–41)
Albumin: 3.4 g/dL — ABNORMAL LOW (ref 3.5–5.0)
Alkaline Phosphatase: 81 U/L (ref 38–126)
Anion gap: 13 (ref 5–15)
BUN: 45 mg/dL — ABNORMAL HIGH (ref 8–23)
CO2: 25 mmol/L (ref 22–32)
Calcium: 8.7 mg/dL — ABNORMAL LOW (ref 8.9–10.3)
Chloride: 104 mmol/L (ref 98–111)
Creatinine, Ser: 1.42 mg/dL — ABNORMAL HIGH (ref 0.61–1.24)
GFR calc Af Amer: 49 mL/min — ABNORMAL LOW (ref >60)
GFR calc non Af Amer: 42 mL/min — ABNORMAL LOW (ref >60)
Glucose, Bld: 124 mg/dL — ABNORMAL HIGH (ref 70–99)
Potassium: 4.5 mmol/L (ref 3.5–5.1)
Sodium: 142 mmol/L (ref 135–145)
Total Bilirubin: 1.9 mg/dL — ABNORMAL HIGH (ref 0.3–1.2)
Total Protein: 5.7 g/dL — ABNORMAL LOW (ref 6.5–8.1)

## 2019-07-10 LAB — CBC WITH DIFFERENTIAL/PLATELET
Abs Immature Granulocytes: 0.06 10*3/uL (ref 0.00–0.07)
Basophils Absolute: 0 10*3/uL (ref 0.0–0.1)
Basophils Relative: 0 %
Eosinophils Absolute: 0 10*3/uL (ref 0.0–0.5)
Eosinophils Relative: 0 %
HCT: 41.7 % (ref 39.0–52.0)
Hemoglobin: 12.5 g/dL — ABNORMAL LOW (ref 13.0–17.0)
Immature Granulocytes: 1 %
Lymphocytes Relative: 2 %
Lymphs Abs: 0.3 10*3/uL — ABNORMAL LOW (ref 0.7–4.0)
MCH: 33.8 pg (ref 26.0–34.0)
MCHC: 30 g/dL (ref 30.0–36.0)
MCV: 112.7 fL — ABNORMAL HIGH (ref 80.0–100.0)
Monocytes Absolute: 0.4 10*3/uL (ref 0.1–1.0)
Monocytes Relative: 3 %
Neutro Abs: 10.8 10*3/uL — ABNORMAL HIGH (ref 1.7–7.7)
Neutrophils Relative %: 94 %
Platelets: 130 10*3/uL — ABNORMAL LOW (ref 150–400)
RBC: 3.7 MIL/uL — ABNORMAL LOW (ref 4.22–5.81)
RDW: 14.9 % (ref 11.5–15.5)
WBC: 11.6 10*3/uL — ABNORMAL HIGH (ref 4.0–10.5)
nRBC: 0 % (ref 0.0–0.2)

## 2019-07-10 LAB — TROPONIN I (HIGH SENSITIVITY)
Troponin I (High Sensitivity): 68 ng/L — ABNORMAL HIGH (ref <18)
Troponin I (High Sensitivity): 73 ng/L — ABNORMAL HIGH (ref <18)

## 2019-07-10 LAB — SARS CORONAVIRUS 2 BY RT PCR (HOSPITAL ORDER, PERFORMED IN ~~LOC~~ HOSPITAL LAB): SARS Coronavirus 2: NEGATIVE

## 2019-07-10 LAB — D-DIMER, QUANTITATIVE: D-Dimer, Quant: 1.6 ug/mL-FEU — ABNORMAL HIGH (ref 0.00–0.50)

## 2019-07-10 LAB — BRAIN NATRIURETIC PEPTIDE: B Natriuretic Peptide: 1038 pg/mL — ABNORMAL HIGH (ref 0.0–100.0)

## 2019-07-10 MED ORDER — FUROSEMIDE 10 MG/ML IJ SOLN
20.0000 mg | Freq: Once | INTRAMUSCULAR | Status: AC
  Administered 2019-07-10: 20 mg via INTRAVENOUS
  Filled 2019-07-10: qty 4

## 2019-07-10 MED ORDER — DILTIAZEM HCL-DEXTROSE 125-5 MG/125ML-% IV SOLN (PREMIX)
5.0000 mg/h | INTRAVENOUS | Status: DC
  Administered 2019-07-10: 5 mg/h via INTRAVENOUS
  Administered 2019-07-11: 10 mg/h via INTRAVENOUS
  Filled 2019-07-10 (×2): qty 125

## 2019-07-10 NOTE — ED Provider Notes (Signed)
[name] DEPT Provider Note   CSN: [id number] Arrival date & time: 07/10/19  1528     History Chief Complaint  Patient presents with  . Leg Swelling  . Generalized weakness    [name] [name] is a 86 y.o. male.  The history is provided by the patient, medical records and a relative. No language interpreter was used.   [name] [name] is a 86 y.o. male who presents to the Emergency Department complaining of generalized weakness.  He presents to the ED accompanied by his son for evaluation of 10 days of progressive generalized weakness.  Pt did have a fall about 3-4 weeks ago.  Sxs began with decreased exercise tolerance.  10 days ago he could ambulate with a walker.  Now he needs assistance to get in and out of a wheelchair.  He has increased BLE edema, mild sob.  No chest pain, fever, cough, nausea, vomiting, diarrhea.  No medication changes. No sick contacts.  He has poor oral intake and increased intermittent confusion compared to baseline.  He was supposed to go to his PCP for eval today but was too weak to get out of bed.  Sxs are severe, constant, worsening.      Past Medical History:  Diagnosis Date  . Abdominal hernia   . Abnormality of gait    due to low back pain , uses  cane and walker  . Anemia of chronic disease    Dr. Alen Blew  . Arthritis   . Bilateral lower extremity edema    wears TED hose  . Cancer [facility])    prostate- treated with radiation  . Cardiac pacemaker in situ 10/11/2006   medtronic  . Chronic iron deficiency anemia oncologist/ hematoloist-- dr Alen Blew   treatment IV Iron infusions  . Chronic low back pain   . CKD (chronic kidney disease), stage III   . COPD with emphysema [facility])    pulmologist-  dr Elsworth Soho  . DDD (degenerative disc disease), lumbosacral   . Degenerative scoliosis   . Dyspnea    with exertion  . Full dentures   . GERD (gastroesophageal reflux disease)   . Gross hematuria   . Headache    history  of migraines- "way in the past"  . Hereditary and idiopathic peripheral neuropathy    bilateral lower leg  . Hiatal hernia   . History of DVT of lower extremity 10/19/2011   chronic distal popliteal right lower extremity  . History of external beam radiation therapy 2008   prostate cancer  . History of pancreatitis 2013  . History of pneumothorax 09/2006   iatrogenic-- in setting cardiac pacemaker placement  . History of prostate cancer 2008   s/p  external radiation therapy  . Hypertension   . Injury of left ulnar nerve 06/2015   Dr. Jannifer Franklin   . Left carpal tunnel syndrome   . Lesion of bladder   . Macular degeneration of both eyes   . Macular degeneration of right eye   . Mild mitral regurgitation   . Mild mitral regurgitation    and trace AI 07/22/08 echo and 10/2010  . Mild pulmonary hypertension ([place])   . Nocturia   . OSA on CPAP   . PAF (paroxysmal atrial fibrillation) ([place]) 10/2011   1st episode documented 09/ 2013 admission for gallstons/ pancreatitis and prior to cholecystectomy , went back in NSR without interventeion  . Pancreatitis 10/2011   gallstone induced  . Presence of permanent cardiac pacemaker   .  Prostate cancer ([place])    Dr. Karsten Ro  . Sinus node dysfunction (HCC)    s/p  cardiac pacemaker placement 10-11-2006  . Sinus node dysfunction (HCC)    pacemaker  . Spinal stenosis    L2  . Venous insufficiency   . Wears glasses   . Wears hearing aid in both ears     Patient Active Problem List   Diagnosis Date Noted  . Acute exacerbation of CHF (congestive heart failure) ([place]) 07/10/2019  . History of cardioversion 04/29/2019  . Sacral fracture, closed ([place]) 04/13/2018  . PCO (posterior capsular opacification), bilateral 05/26/2017  . Weakness of right leg 04/20/2017  . Myelopathy concurrent with and due to stenosis of lumbar spine ([place]) 04/20/2017  . Exudative age-related macular degeneration of right eye with active choroidal neovascularization ([place])  09/30/2016  . Pseudophakia of both eyes 09/30/2016  . Impingement syndrome of right shoulder 06/16/2016  . Impingement syndrome of left shoulder 06/16/2016  . Bilateral leg edema 06/14/2016  . Dizziness and giddiness 06/26/2015  . Abnormality of gait 06/26/2015  . Anxiety 04/14/2015  . GERD (gastroesophageal reflux disease) 04/03/2015  . OSA (obstructive sleep apnea) 04/03/2015  . HLD (hyperlipidemia) 04/03/2015  . SOB (shortness of breath)   . Left elbow fracture 03/29/2015  . Dizziness 01/01/2015  . LBP (low back pain) 01/01/2015  . Malignant neoplasm of prostate ([place]) 01/01/2015  . BP (high blood pressure) 01/01/2015  . DOE (dyspnea on exertion) 10/26/2013  . Essential hypertension, benign 10/26/2013  . Post-traumatic wound infection 10/17/2012  . Cellulitis 10/17/2012  . Atrial fibrillation ([place]) 10/22/2011  . DVT, lower extremity, distal, chronic ([place]) 10/22/2011  . Pacemaker 10/19/2011  . Prostate cancer ([place]) 10/19/2011  . COPD (chronic obstructive pulmonary disease) with emphysema ([place]) 02/04/2011  . Anemia 12/10/2010    Past Surgical History:  Procedure Laterality Date  . BALLOON DILATION N/A 01/14/2015   Procedure: BALLOON DILATION;  Surgeon: Garlan Fair, MD;  Location: [facility];  Service: Endoscopy;  Laterality: N/A;  . BALLOON DILATION N/A 10/12/2017   Procedure: BALLOON DILATION;  Surgeon: Otis Brace, MD;  Location: [facility];  Service: Gastroenterology;  Laterality: N/A;  . BIOPSY  10/12/2017   Procedure: BIOPSY;  Surgeon: Otis Brace, MD;  Location: [facility];  Service: Gastroenterology;;  . CARDIAC CATHETERIZATION  08-29-2000   [place]   no significant obstructive CAD, intact globle LV size and systolic function with regional wall motion abnormalities noted,  ?coronary spasm producing wall motion abnormality, elevated CPK and chest pain (ef 55%)  . CARDIAC PACEMAKER PLACEMENT  10-11-2006    dr Leonia Reeves   W/  ATRIAL LEAD REVISION 10-12-2006    (medtronic)  . CARDIOVASCULAR STRESS TEST  11-18-2011    dr Irish Lack   Low risk nuclear study w/ no ishemia/  normal wall motion,  post-stress ef 48% (LVEF 56% on prior study 2008)  . CARDIOVERSION N/A 04/02/2019   Procedure: CARDIOVERSION;  Surgeon: Josue Hector, MD;  Location: [facility] ENDOSCOPY;  Service: Cardiovascular;  Laterality: N/A;  . CARPAL TUNNEL RELEASE Left 11/14/2014   Procedure: CARPAL TUNNEL RELEASE LEFT THUMB;  Surgeon: Daryll Brod, MD;  Location: [facility];  Service: Orthopedics;  Laterality: Left;  ANESTHESIA:  IV REGIONAL FAB  . CATARACT EXTRACTION W/ INTRAOCULAR LENS  IMPLANT, BILATERAL    . CHOLECYSTECTOMY  10/23/2011  . CHOLECYSTECTOMY  10/23/2011   Procedure: CHOLECYSTECTOMY;  Surgeon: Rolm Bookbinder, MD;  Location: [facility];  Service: General;  Laterality: N/A;  with intraoperative cholangiogram  .  COLONOSCOPY    . ESOPHAGOGASTRODUODENOSCOPY (EGD) WITH PROPOFOL N/A 01/14/2015   Procedure: ESOPHAGOGASTRODUODENOSCOPY (EGD) WITH PROPOFOL;  Surgeon: Garlan Fair, MD;  Location: [facility];  Service: Endoscopy;  Laterality: N/A;  . ESOPHAGOGASTRODUODENOSCOPY (EGD) WITH PROPOFOL N/A 10/12/2017   Procedure: ESOPHAGOGASTRODUODENOSCOPY (EGD) WITH PROPOFOL;  Surgeon: Otis Brace, MD;  Location: [facility];  Service: Gastroenterology;  Laterality: N/A;  . I & D EXTREMITY Left 10/20/2012   Procedure: LEFT LEG IRRIGATION AND DEBRIDEMENT, AND WOUND VAC APPLICATION;  Surgeon: Marianna Payment, MD;  Location: [facility];  Service: Orthopedics;  Laterality: Left;  . I & D EXTREMITY Left 10/22/2012   Procedure: IRRIGATION AND DEBRIDEMENT EXTREMITY;  Surgeon: Marianna Payment, MD;  Location: [facility];  Service: Orthopedics;  Laterality: Left;  . INGUINAL HERNIA REPAIR Left yrs ago  . LAPAROSCOPIC ASSISTED VENTRAL HERNIA REPAIR  08-08-2009   dr Excell Seltzer   AND OPEN REPAIR RECURRENT LEFT INGUINAL HERNIA  . LAPAROSCOPIC INGUINAL HERNIA REPAIR Left 07-24-1999    dr Excell Seltzer    . OPEN VENTRAL HERNIA REPAIR /  LYSIS ADHESIONS  09-23-2010    dr Donne Hazel  . ORIF ELBOW FRACTURE Left 03/29/2015   Procedure: LEFT ELBOW OPEN REDUCTION INTERNAL FIXATION (ORIF) DISTAL HUMERUS FRACTURE WITH OLECRANON OSTEOTOMY AND ULNAR NERVE RELEASE;  Surgeon: Roseanne Kaufman, MD;  Location: [facility];  Service: Orthopedics;  Laterality: Left;  . PPM GENERATOR CHANGEOUT N/A 02/10/2018   Procedure: PPM GENERATOR CHANGEOUT;  Surgeon: Deboraha Sprang, MD;  Location: [facility];  Service: Cardiovascular;  Laterality: N/A;  . REPAIR SUPRAUMBILICAL HERNIA   [id number]   dr Excell Seltzer  . SHOULDER ARTHROSCOPY WITH DISTAL CLAVICLE RESECTION Right 11-07-2007   dr Rhona Raider   [place] [facility]  . SKIN SPLIT GRAFT Left 10/22/2012   Procedure: SKIN GRAFT SPLIT THICKNESS;  Surgeon: Marianna Payment, MD;  Location: [facility];  Service: Orthopedics;  Laterality: Left;  . TOTAL KNEE ARTHROPLASTY Right 1990's  . TRANSTHORACIC ECHOCARDIOGRAM  06-12-2012   dr Irish Lack   ef 50-55%/ mild LAE/ mild MR and TR/  AV sclerosis without stenosis/  RVSP 15mmHg  . TRANSURETHRAL RESECTION OF BLADDER TUMOR N/A 07/15/2017   Procedure: CYSTOSCOPY TRANSURETHRAL RESECTION OF BLADDER TUMOR (TURBT);  Surgeon: Kathie Rhodes, MD;  Location[facility];  Service: Urology;  Laterality: N/A;       Family History  Problem Relation Age of Onset  . Heart disease Father   . Heart disease Mother   . Colon cancer Mother   . Stroke Mother   . Cancer Sister   . Heart disease Sister   . Heart attack Neg Hx   . Hypertension Neg Hx     Social History   Tobacco Use  . Smoking status: Former Smoker    Packs/day: 1.50    Years: 35.00    Pack years: 52.50    Types: Cigarettes    Quit date: 02/15/1973    Years since quitting: 46.4  . Smokeless tobacco: Never Used  Substance Use Topics  . Alcohol use: No  . Drug use: No    Home Medications Prior to Admission medications   Medication Sig Start  Date End Date Taking? Authorizing Provider  acetaminophen (TYLENOL) 650 MG CR tablet Take 650 mg by mouth 2 (two) times daily.   Yes [provider]  albuterol (PROVENTIL HFA;VENTOLIN HFA) 108 (90 Base) MCG/ACT inhaler Inhale 2 puffs into the lungs every 6 (six) hours as needed for wheezing  or shortness of breath. 09/19/15  Yes Rigoberto Noel, MD  apixaban (ELIQUIS) 5 MG TABS tablet Take 1 tablet (5 mg total) by mouth 2 (two) times daily. Patient taking differently: Take 2.5 mg by mouth 2 (two) times daily.  03/01/19  Yes Jettie Booze, MD  Calcium Carbonate-Vitamin D (CALCIUM-D PO) Take 1 tablet by mouth every evening.    Yes [provider]  Cholecalciferol (VITAMIN D3 PO) Take 2,000 Units by mouth daily.   Yes [provider]  ferrous sulfate 325 (65 FE) MG tablet Take 650 mg by mouth daily with breakfast.   Yes [provider]  fluticasone (FLONASE) 50 MCG/ACT nasal spray Place 1 spray into both nostrils daily as needed for allergies or rhinitis.   Yes [provider]  furosemide (LASIX) 20 MG tablet Take 20 mg by mouth daily.    Yes [provider]  mirabegron ER (MYRBETRIQ) 50 MG TB24 tablet Take 50 mg by mouth daily.   Yes [provider]  Multiple Vitamins-Minerals (PRESERVISION AREDS 2 PO) Take 1 each by mouth in the morning and at bedtime.   Yes [provider]  oxyCODONE (OXY IR/ROXICODONE) 5 MG immediate release tablet Take 5 mg by mouth every 6 (six) hours as needed for severe pain.   Yes [provider]  oxymetazoline (AFRIN) 0.05 % nasal spray Place 1 spray into both nostrils 2 (two) times daily as needed for congestion.   Yes [provider]  pantoprazole (PROTONIX) 40 MG tablet Take 1 tablet (40 mg total) by mouth daily before breakfast. 10/12/17 03/22/20 Yes Brahmbhatt, Parag, MD  predniSONE (DELTASONE) 10 MG tablet Take 10 mg by mouth daily with breakfast.   Yes [provider]    psyllium (METAMUCIL SMOOTH TEXTURE) 28 % packet Take 1 packet by mouth daily as needed. Mix with 8 oz liquid   Yes [provider]  umeclidinium-vilanterol (ANORO ELLIPTA) 62.5-25 MCG/INH AEPB Inhale 1 puff into the lungs daily.    Yes [provider]  vitamin B-12 (CYANOCOBALAMIN) 500 MCG tablet Take 500 mcg by mouth. Three days a week   Yes [provider]  diphenhydrAMINE (BENADRYL) 25 MG tablet Take 25 mg by mouth daily as needed for allergies.     [provider]  Folic Acid-Vit Q000111Q [id number] (FOLBEE) 2.5-25-1 MG TABS tablet Take 1 tablet by mouth daily. Patient not taking: Reported on 07/10/2019 07/04/19   Melvenia Beam, MD  predniSONE (DELTASONE) 10 MG tablet 4 tabs for 2 days, then 3 tabs for 2 days, 2 tabs for 2 days, then 1 tab for 2 days, then stop Patient not taking: Reported on 07/10/2019 06/22/19   Lauraine Rinne, NP    Allergies    Patient has no known allergies.  Review of Systems   Review of Systems  All other systems reviewed and are negative.   Physical Exam Updated Vital Signs BP (!) 122/99   Pulse 63   Temp 97.8 F (36.6 C) (Oral)   Resp 17   SpO2 98%   Physical Exam Vitals and nursing note reviewed.  Constitutional:      Appearance: He is well-developed.  HENT:     Head: Normocephalic and atraumatic.  Cardiovascular:     Rate and Rhythm: Tachycardia present. Rhythm irregular.     Heart sounds: No murmur.  Pulmonary:     Effort: Pulmonary effort is normal. No respiratory distress.     Breath sounds: Normal breath sounds.  Abdominal:  Palpations: Abdomen is soft.     Tenderness: There is no abdominal tenderness. There is no guarding or rebound.  Musculoskeletal:        General: No tenderness.     Comments: 2+ pitting edema to BLE.  1+ DP pulses bilaterally.  Serous drainage from skin tear to left calf without local erythema.   Skin:    General: Skin is warm and dry.  Neurological:     Mental Status: He is alert and  oriented to person, place, and time.     Comments: Generalized weakness  Psychiatric:        Behavior: Behavior normal.     ED Results / Procedures / Treatments   Labs (all labs ordered are listed, but only abnormal results are displayed) Labs Reviewed  COMPREHENSIVE METABOLIC PANEL - Abnormal; Notable for the following components:      Result Value   Glucose, Bld 124 (*)    BUN 45 (*)    Creatinine, Ser 1.42 (*)    Calcium 8.7 (*)    Total Protein 5.7 (*)    Albumin 3.4 (*)    Total Bilirubin 1.9 (*)    GFR calc non Af Amer 42 (*)    GFR calc Af Amer 49 (*)    All other components within normal limits  BRAIN NATRIURETIC PEPTIDE - Abnormal; Notable for the following components:   B Natriuretic Peptide 1,038.0 (*)    All other components within normal limits  URINALYSIS, ROUTINE W REFLEX MICROSCOPIC - Abnormal; Notable for the following components:   Color, Urine AMBER (*)    Protein, ur 100 (*)    All other components within normal limits  CBC WITH DIFFERENTIAL/PLATELET - Abnormal; Notable for the following components:   WBC 11.6 (*)    RBC 3.70 (*)    Hemoglobin 12.5 (*)    MCV 112.7 (*)    Platelets 130 (*)    Neutro Abs 10.8 (*)    Lymphs Abs 0.3 (*)    All other components within normal limits  D-DIMER, QUANTITATIVE (NOT AT [facility]) - Abnormal; Notable for the following components:   D-Dimer, Quant 1.60 (*)    All other components within normal limits  TROPONIN I (HIGH SENSITIVITY) - Abnormal; Notable for the following components:   Troponin I (High Sensitivity) 68 (*)    All other components within normal limits  TROPONIN I (HIGH SENSITIVITY) - Abnormal; Notable for the following components:   Troponin I (High Sensitivity) 73 (*)    All other components within normal limits  SARS CORONAVIRUS 2 BY RT PCR (HOSPITAL ORDER, [facility])  URINE CULTURE  BASIC METABOLIC PANEL  MAGNESIUM  TSH    EKG EKG Interpretation  Date/Time:  Tuesday  Jul 10 2019 16:16:08 EDT Ventricular Rate:  140 PR Interval:    QRS Duration: 104 QT Interval:  330 QTC Calculation: 504 R Axis:   -33 Text Interpretation: Age not entered, assumed to be  86 years old for purpose of ECG interpretation Atrial fibrillation Paired ventricular premature complexes Left axis deviation RSR' in V1 or V2, right VCD or RVH Borderline T abnormalities, lateral leads Prolonged QT interval Confirmed by Quintella Reichert [phone]) on 07/10/2019 5:25:50 PM   Radiology CT Head Wo Contrast  Result Date: 07/10/2019 CLINICAL DATA:  Progressive weakness fall 3 weeks ago EXAM: CT HEAD WITHOUT CONTRAST TECHNIQUE: Contiguous axial images were obtained from the base of the skull through the vertex without intravenous contrast. COMPARISON:  CT brain 12/17/2017 FINDINGS: Brain: No acute territorial infarction, hemorrhage or intracranial mass. Moderate atrophy. Mild chronic small vessel ischemic change of the white matter. Stable ventricle size. Slight asymmetric enlargement of left convexity extra-axial CSF density potentially due to chronic subdural effusion. Vascular: No hyperdense vessels.  Carotid vascular calcification Skull: Normal. Negative for fracture or focal lesion. Sinuses/Orbits: Postsurgical changes of the right maxillary sinus. Mucosal thickening. Other: None IMPRESSION: 1. No CT evidence for acute intracranial abnormality. 2. Atrophy and chronic small vessel ischemic change of the white matter Electronically Signed   By: Donavan Foil M.D.   On: 07/10/2019 18:09   CT Cervical Spine Wo Contrast  Result Date: 07/10/2019 CLINICAL DATA:  Progressive weakness, history of fall 3 weeks ago EXAM: CT CERVICAL SPINE WITHOUT CONTRAST TECHNIQUE: Multidetector CT imaging of the cervical spine was performed without intravenous contrast. Multiplanar CT image reconstructions were also generated. COMPARISON:  CT 12/17/2017 FINDINGS: Alignment: Trace retrolisthesis C4 on C5 and anterolisthesis C7 on  T1 without change. Facet alignment is maintained Skull base and vertebrae: No acute fracture. No primary bone lesion or focal pathologic process. Soft tissues and spinal canal: No prevertebral fluid or swelling. No visible canal hematoma. Disc levels: Moderate severe degenerative changes throughout the cervical spine with multiple level disc space narrowing and osteophyte. Facet degenerative change at multiple levels with multiple level bilateral foraminal stenosis. Upper chest: Bilateral pleural effusions Other: None IMPRESSION: 1. Diffuse degenerative changes without acute fracture 2. Pleural effusions at the apices Electronically Signed   By: Donavan Foil M.D.   On: 07/10/2019 18:14   DG Chest Port 1 View  Result Date: 07/10/2019 CLINICAL DATA:  Generalized weakness, bilateral lower extremity edema EXAM: PORTABLE CHEST 1 VIEW COMPARISON:  06/20/2019 FINDINGS: Single frontal view of the chest demonstrates increased veiling opacities at the lung bases consistent with progressive consolidation and enlarging bilateral pleural effusions. Increased central vascular congestion. Cardiac silhouette is stable. No pneumothorax. Dual lead pacer unchanged. IMPRESSION: 1. Worsening volume status with progressive congestive heart failure. Electronically Signed   By: Randa Ngo M.D.   On: 07/10/2019 16:57    Procedures Procedures (including critical care time) CRITICAL CARE Performed by: Quintella Reichert   Total critical care time: 30 minutes  Critical care time was exclusive of separately billable procedures and treating other patients.  Critical care was necessary to treat or prevent imminent or life-threatening deterioration.  Critical care was time spent personally by me on the following activities: development of treatment plan with patient and/or surrogate as well as nursing, discussions with consultants, evaluation of patient's response to treatment, examination of patient, obtaining history from  patient or surrogate, ordering and performing treatments and interventions, ordering and review of laboratory studies, ordering and review of radiographic studies, pulse oximetry and re-evaluation of patient's condition.  Medications Ordered in ED Medications  diltiazem (CARDIZEM) 125 mg in dextrose 5% 125 mL (1 mg/mL) infusion (10 mg/hr Intravenous Rate/Dose Change 07/10/19 1748)  oxyCODONE (Oxy IR/ROXICODONE) immediate release tablet 5 mg (has no administration in time range)  predniSONE (DELTASONE) tablet 10 mg (has no administration in time range)  pantoprazole (PROTONIX) EC tablet 40 mg (has no administration in time range)  psyllium (METAMUCIL SMOOTH TEXTURE) 28 % packet 1 packet (has no administration in time range)  mirabegron ER (MYRBETRIQ) tablet 50 mg (has no administration in time range)  apixaban (ELIQUIS) tablet 2.5 mg (has no administration in time range)  ferrous sulfate tablet 650 mg (has no administration in time range)  vitamin B-12 (CYANOCOBALAMIN) tablet 500 mcg (has no administration in time range)  cholecalciferol (VITAMIN D) tablet 2,000 Units (has no administration in time range)  albuterol (VENTOLIN HFA) 108 (90 Base) MCG/ACT inhaler 2 puff (has no administration in time range)  fluticasone (FLONASE) 50 MCG/ACT nasal spray 1 spray (has no administration in time range)  umeclidinium-vilanterol (ANORO ELLIPTA) 62.5-25 MCG/INH 1 puff (has no administration in time range)  sodium chloride flush (NS) 0.9 % injection 3 mL (3 mLs Intravenous Not Given 07/11/19 0000)  sodium chloride flush (NS) 0.9 % injection 3 mL (has no administration in time range)  0.9 %  sodium chloride infusion (has no administration in time range)  acetaminophen (TYLENOL) tablet 650 mg (has no administration in time range)  ondansetron (ZOFRAN) injection 4 mg (has no administration in time range)  furosemide (LASIX) injection 20 mg (has no administration in time range)  lisinopril (ZESTRIL) tablet 2.5  mg (has no administration in time range)  aspirin EC tablet 81 mg (has no administration in time range)  diltiazem (CARDIZEM CD) 24 hr capsule 120 mg (has no administration in time range)  furosemide (LASIX) injection 20 mg (20 mg Intravenous Given 07/10/19 1950)    ED Course  I have reviewed the triage vital signs and the nursing notes.  Pertinent labs & imaging results that were available during my care of the patient were reviewed by me and considered in my medical decision making (see chart for details).    MDM Rules/Calculators/A&P                      Patient here for evaluation of progressive generalized weakness, shortness of breath. He is chronically ill appearing on evaluation, in a fib with RVR. He is volume overloaded on examination. Chest x-ray, labs consistent with congestive heart failure. He was treated with diltiazem for rate control, Lasix for diuresis. Discussed with patient and son findings of studies recommendation for admission and they are in agreement with treatment plan plan. Hospitalist consulted for admission.. Final Clinical Impression(s) / ED Diagnoses Final diagnoses:  Atrial fibrillation with RVR ([place])  Acute congestive heart failure, unspecified heart failure type [facility])    Rx / DC Orders ED Discharge Orders    None       Quintella Reichert, MD 07/11/19 [phone]

## 2019-07-10 NOTE — H&P (Addendum)
History and Physical   [facility] [place] [facility] [place] Admission History and Physical [employer], D.O.    Patient Name: [name] [name] MR#: [id number] Date of Birth: 1924-10-29 Date of Admission: 07/10/2019  Referring MD/NP/PA: Dr. Ralene Bathe Primary Care Physician: Lawerance Cruel, MD  Chief Complaint:  Chief Complaint  Patient presents with  . Leg Swelling  . Generalized weakness    HPI: [name] [name] is a 86 y.o. male with a known history of Afib with PPM, CKD3, COPD,  presents to the emergency department for evaluation of weakness, SOB, LE edema.  Patient was in a usual state of health until about 10 days ago when he started to become progressively weaker, with worsening LE edema and now SOB.  Lives at home with his wife who is 79, was ambulating with a walker, but now needs assistance.  He did sustain a fall 3 weeks ago but recovered.   In the ER he was found to be in afib with RVR and new onset CHF, was given Lasix and started on a Cardizem drip.  Foley cath put out 300cc dark yellow urine so far.   Patient complains only of fatigue, denies fevers/chills,  dizziness, chest pain, shortness of breath, N/V/C/D, abdominal pain, dysuria/frequency, changes in mental status.    Otherwise there has been no change in status. Patient has been taking medication as prescribed and there has been no recent change in medication or diet.  No recent antibiotics.  There has been no recent illness, hospitalizations, travel or sick contacts.    EMS/ED Course: Patient received Lasix, Cardizem drip. Medical admission has been requested for further management of afib with RVR, new onset CHF, aki on ckd  Review of Systems:  CONSTITUTIONAL: Positive fatigue, weakness. No fever/chills, weight gain/loss, headache. EYES: No blurry or double vision. ENT: No tinnitus, postnasal drip, redness or soreness of the oropharynx. RESPIRATORY: Positive SOB. No cough, wheeze.  No hemoptysis.   CARDIOVASCULAR:  Positive bilateral lower extremity edema.No chest pain, palpitations, syncope, orthopnea.  GASTROINTESTINAL: No nausea, vomiting, abdominal pain, diarrhea, constipation.  No hematemesis, melena or hematochezia. GENITOURINARY: No dysuria, frequency, hematuria. ENDOCRINE: No polyuria or nocturia. No heat or cold intolerance. HEMATOLOGY: No anemia, bruising, bleeding. INTEGUMENTARY: No rashes, ulcers, lesions. MUSCULOSKELETAL: Positive gait instability / weakness. No arthritis, gout, dyspnea. NEUROLOGIC: No numbness, tingling, ataxia, seizure-type activity, weakness. PSYCHIATRIC: No anxiety, depression, insomnia.   Past Medical History:  Diagnosis Date  . Abdominal hernia   . Abnormality of gait    due to low back pain , uses  cane and walker  . Anemia of chronic disease    Dr. Alen Blew  . Arthritis   . Bilateral lower extremity edema    wears TED hose  . Cancer [facility])    prostate- treated with radiation  . Cardiac pacemaker in situ 10/11/2006   medtronic  . Chronic iron deficiency anemia oncologist/ hematoloist-- dr Alen Blew   treatment IV Iron infusions  . Chronic low back pain   . CKD (chronic kidney disease), stage III   . COPD with emphysema [facility])    pulmologist-  dr Elsworth Soho  . DDD (degenerative disc disease), lumbosacral   . Degenerative scoliosis   . Dyspnea    with exertion  . Full dentures   . GERD (gastroesophageal reflux disease)   . Gross hematuria   . Headache    history of migraines- "way in the past"  . Hereditary and idiopathic peripheral neuropathy    bilateral lower leg  .  Hiatal hernia   . History of DVT of lower extremity 10/19/2011   chronic distal popliteal right lower extremity  . History of external beam radiation therapy 2008   prostate cancer  . History of pancreatitis 2013  . History of pneumothorax 09/2006   iatrogenic-- in setting cardiac pacemaker placement  . History of prostate cancer 2008   s/p  external radiation therapy  .  Hypertension   . Injury of left ulnar nerve 06/2015   Dr. Jannifer Franklin   . Left carpal tunnel syndrome   . Lesion of bladder   . Macular degeneration of both eyes   . Macular degeneration of right eye   . Mild mitral regurgitation   . Mild mitral regurgitation    and trace AI 07/22/08 echo and 10/2010  . Mild pulmonary hypertension ([place])   . Nocturia   . OSA on CPAP   . PAF (paroxysmal atrial fibrillation) ([place]) 10/2011   1st episode documented 09/ 2013 admission for gallstons/ pancreatitis and prior to cholecystectomy , went back in NSR without interventeion  . Pancreatitis 10/2011   gallstone induced  . Presence of permanent cardiac pacemaker   . Prostate cancer ([place])    Dr. Karsten Ro  . Sinus node dysfunction (H[facility])    s/p  cardiac pacemaker placement 10-11-2006  . Sinus node dysfunction ([facility])    pacemaker  . Spinal stenosis    L2  . Venous insufficiency   . Wears glasses   . Wears hearing aid in both ears     Past Surgical History:  Procedure Laterality Date  . BALLOON DILATION N/A 01/14/2015   Procedure: BALLOON DILATION;  Surgeon: Garlan Fair, MD;  Location: [facility];  Service: Endoscopy;  Laterality: N/A;  . BALLOON DILATION N/A 10/12/2017   Procedure: BALLOON DILATION;  Surgeon: Otis Brace, MD;  Location: [facility];  Service: Gastroenterology;  Laterality: N/A;  . BIOPSY  10/12/2017   Procedure: BIOPSY;  Surgeon: Otis Brace, MD;  Location: [facility];  Service: Gastroenterology;;  . CARDIAC CATHETERIZATION  08-29-2000   [place]   no significant obstructive CAD, intact globle LV size and systolic function with regional wall motion abnormalities noted,  ?coronary spasm producing wall motion abnormality, elevated CPK and chest pain (ef 55%)  . CARDIAC PACEMAKER PLACEMENT  10-11-2006    dr Leonia Reeves   W/  ATRIAL LEAD REVISION 10-12-2006   (medtronic)  . CARDIOVASCULAR STRESS TEST  11-18-2011    dr Irish Lack   Low risk nuclear study w/ no ishemia/  normal wall  motion,  post-stress ef 48% (LVEF 56% on prior study 2008)  . CARDIOVERSION N/A 04/02/2019   Procedure: CARDIOVERSION;  Surgeon: Josue Hector, MD;  Location: [facility];  Service: Cardiovascular;  Laterality: N/A;  . CARPAL TUNNEL RELEASE Left 11/14/2014   Procedure: CARPAL TUNNEL RELEASE LEFT THUMB;  Surgeon: Daryll Brod, MD;  Location: [place];  Service: Orthopedics;  Laterality: Left;  ANESTHESIA:  IV REGIONAL FAB  . CATARACT EXTRACTION W/ INTRAOCULAR LENS  IMPLANT, BILATERAL    . CHOLECYSTECTOMY  10/23/2011  . CHOLECYSTECTOMY  10/23/2011   Procedure: CHOLECYSTECTOMY;  Surgeon: Rolm Bookbinder, MD;  Location: [place];  Service: General;  Laterality: N/A;  with intraoperative cholangiogram  . COLONOSCOPY    . ESOPHAGOGASTRODUODENOSCOPY (EGD) WITH PROPOFOL N/A 01/14/2015   Procedure: ESOPHAGOGASTRODUODENOSCOPY (EGD) WITH PROPOFOL;  Surgeon: Garlan Fair, MD;  Location: [facility];  Service: Endoscopy;  Laterality: N/A;  . ESOPHAGOGASTRODUODENOSCOPY (EGD) WITH PROPOFOL N/A 10/12/2017   Procedure:  ESOPHAGOGASTRODUODENOSCOPY (EGD) WITH PROPOFOL;  Surgeon: Otis Brace, MD;  Location: [place];  Service: Gastroenterology;  Laterality: N/A;  . I & D EXTREMITY Left 10/20/2012   Procedure: LEFT LEG IRRIGATION AND DEBRIDEMENT, AND WOUND VAC APPLICATION;  Surgeon: Marianna Payment, MD;  Location: [facility];  Service: Orthopedics;  Laterality: Left;  . I & D EXTREMITY Left 10/22/2012   Procedure: IRRIGATION AND DEBRIDEMENT EXTREMITY;  Surgeon: Marianna Payment, MD;  Location: [facility];  Service: Orthopedics;  Laterality: Left;  . INGUINAL HERNIA REPAIR Left yrs ago  . LAPAROSCOPIC ASSISTED VENTRAL HERNIA REPAIR  08-08-2009   dr Excell Seltzer   AND OPEN REPAIR RECURRENT LEFT INGUINAL HERNIA  . LAPAROSCOPIC INGUINAL HERNIA REPAIR Left 07-24-1999    dr Excell Seltzer  . OPEN VENTRAL HERNIA REPAIR /  LYSIS ADHESIONS  09-23-2010    dr Donne Hazel  . ORIF ELBOW FRACTURE Left 03/29/2015    Procedure: LEFT ELBOW OPEN REDUCTION INTERNAL FIXATION (ORIF) DISTAL HUMERUS FRACTURE WITH OLECRANON OSTEOTOMY AND ULNAR NERVE RELEASE;  Surgeon: Roseanne Kaufman, MD;  Location: [facility];  Service: Orthopedics;  Laterality: Left;  . PPM GENERATOR CHANGEOUT N/A 02/10/2018   Procedure: PPM GENERATOR CHANGEOUT;  Surgeon: Deboraha Sprang, MD;  Location: [place] CV LAB;  Service: Cardiovascular;  Laterality: N/A;  . REPAIR SUPRAUMBILICAL HERNIA   [id number]   dr Excell Seltzer  . SHOULDER ARTHROSCOPY WITH DISTAL CLAVICLE RESECTION Right 11-07-2007   dr Rhona Raider   [place] ACROMIOPLASTY  . SKIN SPLIT GRAFT Left 10/22/2012   Procedure: SKIN GRAFT SPLIT THICKNESS;  Surgeon: Marianna Payment, MD;  Location: [facility];  Service: Orthopedics;  Laterality: Left;  . TOTAL KNEE ARTHROPLASTY Right 1990's  . TRANSTHORACIC ECHOCARDIOGRAM  06-12-2012   dr Irish Lack   ef 50-55%/ mild LAE/ mild MR and TR/  AV sclerosis without stenosis/  RVSP 2mmHg  . TRANSURETHRAL RESECTION OF BLADDER TUMOR N/A 07/15/2017   Procedure: CYSTOSCOPY TRANSURETHRAL RESECTION OF BLADDER TUMOR (TURBT);  Surgeon: Kathie Rhodes, MD;  Location: [facility];  Service: Urology;  Laterality: N/A;     reports that he quit smoking about 46 years ago. His smoking use included cigarettes. He has a 52.50 pack-year smoking history. He has never used smokeless tobacco. He reports that he does not drink alcohol or use drugs.  No Known Allergies  Family History  Problem Relation Age of Onset  . Heart disease Father   . Heart disease Mother   . Colon cancer Mother   . Stroke Mother   . Cancer Sister   . Heart disease Sister   . Heart attack Neg Hx   . Hypertension Neg Hx     Prior to Admission medications   Medication Sig Start Date End Date Taking? Authorizing Provider  acetaminophen (TYLENOL) 650 MG CR tablet Take 650 mg by mouth 2 (two) times daily.   Yes [provider]  albuterol (PROVENTIL HFA;VENTOLIN  HFA) 108 (90 Base) MCG/ACT inhaler Inhale 2 puffs into the lungs every 6 (six) hours as needed for wheezing or shortness of breath. 09/19/15  Yes Rigoberto Noel, MD  apixaban (ELIQUIS) 5 MG TABS tablet Take 1 tablet (5 mg total) by mouth 2 (two) times daily. Patient taking differently: Take 2.5 mg by mouth 2 (two) times daily.  03/01/19  Yes Jettie Booze, MD  Calcium Carbonate-Vitamin D (CALCIUM-D PO) Take 1 tablet by mouth every evening.    Yes [provider]  Cholecalciferol (VITAMIN D3 PO) Take  2,000 Units by mouth daily.   Yes [provider]  ferrous sulfate 325 (65 FE) MG tablet Take 650 mg by mouth daily with breakfast.   Yes [provider]  fluticasone (FLONASE) 50 MCG/ACT nasal spray Place 1 spray into both nostrils daily as needed for allergies or rhinitis.   Yes [provider]  furosemide (LASIX) 20 MG tablet Take 20 mg by mouth daily.    Yes [provider]  mirabegron ER (MYRBETRIQ) 50 MG TB24 tablet Take 50 mg by mouth daily.   Yes [provider]  Multiple Vitamins-Minerals (PRESERVISION AREDS 2 PO) Take 1 each by mouth in the morning and at bedtime.   Yes [provider]  oxyCODONE (OXY IR/ROXICODONE) 5 MG immediate release tablet Take 5 mg by mouth every 6 (six) hours as needed for severe pain.   Yes [provider]  oxymetazoline (AFRIN) 0.05 % nasal spray Place 1 spray into both nostrils 2 (two) times daily as needed for congestion.   Yes [provider]  pantoprazole (PROTONIX) 40 MG tablet Take 1 tablet (40 mg total) by mouth daily before breakfast. 10/12/17 03/22/20 Yes Brahmbhatt, Parag, MD  predniSONE (DELTASONE) 10 MG tablet Take 10 mg by mouth daily with breakfast.   Yes [provider]  psyllium (METAMUCIL SMOOTH TEXTURE) 28 % packet Take 1 packet by mouth daily as needed. Mix with 8 oz liquid   Yes [provider]  umeclidinium-vilanterol (ANORO ELLIPTA) 62.5-25 MCG/INH  AEPB Inhale 1 puff into the lungs daily.    Yes [provider]  vitamin B-12 (CYANOCOBALAMIN) 500 MCG tablet Take 500 mcg by mouth. Three days a week   Yes [provider]  diphenhydrAMINE (BENADRYL) 25 MG tablet Take 25 mg by mouth daily as needed for allergies.     [provider]  Folic Acid-Vit [id number] [id number] (FOLBEE) 2.5-25-1 MG TABS tablet Take 1 tablet by mouth daily. Patient not taking: Reported on 07/10/2019 07/04/19   Melvenia Beam, MD  predniSONE (DELTASONE) 10 MG tablet 4 tabs for 2 days, then 3 tabs for 2 days, 2 tabs for 2 days, then 1 tab for 2 days, then stop Patient not taking: Reported on 07/10/2019 06/22/19   Lauraine Rinne, NP    Physical Exam: Vitals:   07/10/19 1900 07/10/19 1930 07/10/19 1934 07/10/19 1937  BP:  128/86  128/86  Pulse: (!) 51 100 96 95  Resp: 20 19  (!) 21  Temp:      TempSrc:      SpO2: 99% 97%  97%    GENERAL: 86 y.o.-year-old white male patient, cachectic, lying in the bed in no acute distress. Hard of hearing. Pleasant and cooperative.   HEENT: Head atraumatic, normocephalic. Pupils equal. Mucus membranes dry NECK: Supple. No JVD. CHEST: Normal breath sounds bilaterally. No wheezing, rales, rhonchi or crackles. No use of accessory muscles of respiration.  PPM at left chest wall. CARDIOVASCULAR: Irregularly irregular. S1, S2 normal. No murmurs, rubs, or gallops. ABDOMEN: Soft, nondistended, nontender. No rebound, guarding, rigidity. Normoactive bowel sounds present in all four quadrants. Foley in place draining dark yellow urine EXTREMITIES: Pitting edema to bilateral lower extremities. Open laceration to left lateral calf.  Edema, seeping serous fluid.  No calf tenderness or Homan's sign.  NEUROLOGIC: The patient is alert and oriented x 3. Cranial nerves II through XII are grossly intact with no focal sensorimotor deficit. PSYCHIATRIC:  Normal affect, mood, thought content. SKIN: Ecchymosis diffusely - bilateral arms, chest  /  trunk, bruises to lower extremities.     Labs on Admission:  CBC: Recent Labs  Lab 07/10/19 1620  WBC 11.6*  NEUTROABS 10.8*  HGB 12.5*  HCT 41.7  MCV 112.7*  PLT [id number]*   Basic Metabolic Panel: Recent Labs  Lab 07/10/19 1620  NA 142  K 4.5  CL 104  CO2 25  GLUCOSE 124*  BUN 45*  CREATININE 1.42*  CALCIUM 8.7*   GFR: Estimated Creatinine Clearance: 32.3 mL/min (A) (by C-G formula based on SCr of 1.42 mg/dL (H)). Liver Function Tests: Recent Labs  Lab 07/10/19 1620  AST 38  ALT 30  ALKPHOS 81  BILITOT 1.9*  PROT 5.7*  ALBUMIN 3.4*   No results for input(s): LIPASE, AMYLASE in the last 168 hours. No results for input(s): AMMONIA in the last 168 hours. Coagulation Profile: No results for input(s): INR, PROTIME in the last 168 hours. Cardiac Enzymes: No results for input(s): CKTOTAL, CKMB, CKMBINDEX, TROPONINI in the last 168 hours. BNP (last 3 results) No results for input(s): PROBNP in the last 8760 hours. HbA1C: No results for input(s): HGBA1C in the last 72 hours. CBG: No results for input(s): GLUCAP in the last 168 hours. Lipid Profile: No results for input(s): CHOL, HDL, LDLCALC, TRIG, CHOLHDL, LDLDIRECT in the last 72 hours. Thyroid Function Tests: No results for input(s): TSH, T4TOTAL, FREET4, T3FREE, THYROIDAB in the last 72 hours. Anemia Panel: No results for input(s): VITAMINB12, FOLATE, FERRITIN, TIBC, IRON, RETICCTPCT in the last 72 hours. Urine analysis:    Component Value Date/Time   COLORURINE AMBER (A) 07/10/2019 1635   APPEARANCEUR CLEAR 07/10/2019 1635   LABSPEC 1.027 07/10/2019 1635   PHURINE 5.0 07/10/2019 1635   GLUCOSEU NEGATIVE 07/10/2019 1635   HGBUR NEGATIVE 07/10/2019 1635   BILIRUBINUR NEGATIVE 07/10/2019 1635   KETONESUR NEGATIVE 07/10/2019 1635   PROTEINUR 100 (A) 07/10/2019 1635   UROBILINOGEN 1.0 08/06/2009 1001   NITRITE NEGATIVE 07/10/2019 1635   LEUKOCYTESUR NEGATIVE 07/10/2019 1635   Sepsis  Labs: @LABRCNTIP (procalcitonin:4,lacticidven:4) )No results found for this or any previous visit (from the past 240 hour(s)).   Radiological Exams on Admission: CT Head Wo Contrast  Result Date: 07/10/2019 CLINICAL DATA:  Progressive weakness fall 3 weeks ago EXAM: CT HEAD WITHOUT CONTRAST TECHNIQUE: Contiguous axial images were obtained from the base of the skull through the vertex without intravenous contrast. COMPARISON:  CT brain 12/17/2017 FINDINGS: Brain: No acute territorial infarction, hemorrhage or intracranial mass. Moderate atrophy. Mild chronic small vessel ischemic change of the white matter. Stable ventricle size. Slight asymmetric enlargement of left convexity extra-axial CSF density potentially due to chronic subdural effusion. Vascular: No hyperdense vessels.  Carotid vascular calcification Skull: Normal. Negative for fracture or focal lesion. Sinuses/Orbits: Postsurgical changes of the right maxillary sinus. Mucosal thickening. Other: None IMPRESSION: 1. No CT evidence for acute intracranial abnormality. 2. Atrophy and chronic small vessel ischemic change of the white matter Electronically Signed   By: Donavan Foil M.D.   On: 07/10/2019 18:09   CT Cervical Spine Wo Contrast  Result Date: 07/10/2019 CLINICAL DATA:  Progressive weakness, history of fall 3 weeks ago EXAM: CT CERVICAL SPINE WITHOUT CONTRAST TECHNIQUE: Multidetector CT imaging of the cervical spine was performed without intravenous contrast. Multiplanar CT image reconstructions were also generated. COMPARISON:  CT 12/17/2017 FINDINGS: Alignment: Trace retrolisthesis C4 on C5 and anterolisthesis C7 on T1 without change. Facet alignment is maintained Skull base and vertebrae: No acute fracture. No primary bone lesion or focal pathologic process. Soft tissues  and spinal canal: No prevertebral fluid or swelling. No visible canal hematoma. Disc levels: Moderate severe degenerative changes throughout the cervical spine with  multiple level disc space narrowing and osteophyte. Facet degenerative change at multiple levels with multiple level bilateral foraminal stenosis. Upper chest: Bilateral pleural effusions Other: None IMPRESSION: 1. Diffuse degenerative changes without acute fracture 2. Pleural effusions at the apices Electronically Signed   By: Donavan Foil M.D.   On: 07/10/2019 18:14   DG Chest Port 1 View  Result Date: 07/10/2019 CLINICAL DATA:  Generalized weakness, bilateral lower extremity edema EXAM: PORTABLE CHEST 1 VIEW COMPARISON:  06/20/2019 FINDINGS: Single frontal view of the chest demonstrates increased veiling opacities at the lung bases consistent with progressive consolidation and enlarging bilateral pleural effusions. Increased central vascular congestion. Cardiac silhouette is stable. No pneumothorax. Dual lead pacer unchanged. IMPRESSION: 1. Worsening volume status with progressive congestive heart failure. Electronically Signed   By: Randa Ngo M.D.   On: 07/10/2019 16:57    EKG: Atrial fibrillation with RVR at 140, LAD, RSR' in V1-2, nonspecific ST-T wave changes.   Assessment/Plan  This is a 86 y.o. male with a history of Afib with PPM, CKD3, COPD, now being admitted with:  #. Afib with RVR - Admit stepdown, tele - Cardizem drip for rate control - Start PO Cardizem - Continue Eliquis - Cardio consult  #.New onset CHF - IV Lasix - Is/Os - Check echo - Trend trops - Check TSH - Add ACE, no beta blocker due to h/o bradycardia  # Elevated troponin - demand ischemia in the setting of CKD vs. NSTEMI - Trend trops - aspirin - Abnormal EKG, check DDimer and CTA if positive  # AKI on CKD - Monitor BMP  #. Generalized weakness, debility - PT eval and treat  #. History of GERD - Continue Protonix  #. History of COPD - Continue O2, nebs as needed, Flonase, prednisone  Admission status: Inpatient stepdown tele IV Fluids: HL Diet/Nutrition: Heart healthy Consults called:  Cardiology  DVT Px: Eliquis, SCDs and early ambulation. Code Status: DNR - family does not want any aggressive measures, prefers medical management.  Disposition Plan: To be determined.   All the records are reviewed and case discussed with ED provider. Management plans discussed with the patient and/or family who express understanding and agree with plan of care.  [employer] D.O. on 07/10/2019 at 7:43 PM CC: Primary care physician; Lawerance Cruel, MD   07/10/2019, 7:43 PM

## 2019-07-10 NOTE — ED Triage Notes (Signed)
Pt arrives via [facility] from home. Pt reports chronic low back pain. Pt reports worsening bilateral lower extremity swelling. Pts son reports generalized weakness for 2 weeks.

## 2019-07-10 NOTE — ED Notes (Signed)
Pt transported to CT at this time.

## 2019-07-11 ENCOUNTER — Inpatient Hospital Stay (HOSPITAL_COMMUNITY): Payer: Medicare Other

## 2019-07-11 DIAGNOSIS — I361 Nonrheumatic tricuspid (valve) insufficiency: Secondary | ICD-10-CM

## 2019-07-11 DIAGNOSIS — I350 Nonrheumatic aortic (valve) stenosis: Secondary | ICD-10-CM

## 2019-07-11 DIAGNOSIS — I480 Paroxysmal atrial fibrillation: Secondary | ICD-10-CM

## 2019-07-11 DIAGNOSIS — I340 Nonrheumatic mitral (valve) insufficiency: Secondary | ICD-10-CM

## 2019-07-11 DIAGNOSIS — R778 Other specified abnormalities of plasma proteins: Secondary | ICD-10-CM

## 2019-07-11 DIAGNOSIS — I5021 Acute systolic (congestive) heart failure: Secondary | ICD-10-CM

## 2019-07-11 LAB — BASIC METABOLIC PANEL
Anion gap: 12 (ref 5–15)
BUN: 47 mg/dL — ABNORMAL HIGH (ref 8–23)
CO2: 27 mmol/L (ref 22–32)
Calcium: 8.7 mg/dL — ABNORMAL LOW (ref 8.9–10.3)
Chloride: 102 mmol/L (ref 98–111)
Creatinine, Ser: 1.34 mg/dL — ABNORMAL HIGH (ref 0.61–1.24)
GFR calc Af Amer: 53 mL/min — ABNORMAL LOW (ref >60)
GFR calc non Af Amer: 45 mL/min — ABNORMAL LOW (ref >60)
Glucose, Bld: 106 mg/dL — ABNORMAL HIGH (ref 70–99)
Potassium: 3.8 mmol/L (ref 3.5–5.1)
Sodium: 141 mmol/L (ref 135–145)

## 2019-07-11 LAB — MRSA PCR SCREENING: MRSA by PCR: POSITIVE — AB

## 2019-07-11 LAB — MAGNESIUM: Magnesium: 2.2 mg/dL (ref 1.7–2.4)

## 2019-07-11 LAB — TSH: TSH: 1.512 u[IU]/mL (ref 0.350–4.500)

## 2019-07-11 LAB — ECHOCARDIOGRAM COMPLETE

## 2019-07-11 MED ORDER — CYANOCOBALAMIN 500 MCG PO TABS
500.0000 ug | ORAL_TABLET | ORAL | Status: DC
  Administered 2019-07-11 – 2019-07-16 (×3): 500 ug via ORAL
  Filled 2019-07-11 (×3): qty 1

## 2019-07-11 MED ORDER — ACETAMINOPHEN 325 MG PO TABS
650.0000 mg | ORAL_TABLET | ORAL | Status: DC | PRN
  Administered 2019-07-15: 650 mg via ORAL
  Filled 2019-07-11: qty 2

## 2019-07-11 MED ORDER — MIRABEGRON ER 25 MG PO TB24
50.0000 mg | ORAL_TABLET | Freq: Every day | ORAL | Status: DC
  Administered 2019-07-11 – 2019-07-16 (×6): 50 mg via ORAL
  Filled 2019-07-11 (×6): qty 2

## 2019-07-11 MED ORDER — PSYLLIUM 95 % PO PACK
1.0000 | PACK | Freq: Every day | ORAL | Status: DC | PRN
  Filled 2019-07-11: qty 1

## 2019-07-11 MED ORDER — DILTIAZEM HCL ER COATED BEADS 120 MG PO CP24
120.0000 mg | ORAL_CAPSULE | Freq: Every day | ORAL | Status: DC
  Administered 2019-07-11: 120 mg via ORAL
  Filled 2019-07-11: qty 1

## 2019-07-11 MED ORDER — PREDNISONE 5 MG PO TABS
10.0000 mg | ORAL_TABLET | Freq: Every day | ORAL | Status: DC
  Administered 2019-07-11 – 2019-07-16 (×6): 10 mg via ORAL
  Filled 2019-07-11 (×6): qty 1

## 2019-07-11 MED ORDER — METOPROLOL TARTRATE 25 MG PO TABS
25.0000 mg | ORAL_TABLET | Freq: Two times a day (BID) | ORAL | Status: DC
  Administered 2019-07-12: 25 mg via ORAL
  Filled 2019-07-11: qty 1

## 2019-07-11 MED ORDER — LISINOPRIL 2.5 MG PO TABS
2.5000 mg | ORAL_TABLET | Freq: Every day | ORAL | Status: DC
  Administered 2019-07-11: 2.5 mg via ORAL
  Filled 2019-07-11: qty 1

## 2019-07-11 MED ORDER — FLUTICASONE PROPIONATE 50 MCG/ACT NA SUSP: 1.0000 | Freq: Every day | NASAL | Status: DC | PRN

## 2019-07-11 MED ORDER — ASPIRIN EC 81 MG PO TBEC
81.0000 mg | DELAYED_RELEASE_TABLET | Freq: Every day | ORAL | Status: DC
  Administered 2019-07-11: 81 mg via ORAL
  Filled 2019-07-11: qty 1

## 2019-07-11 MED ORDER — SODIUM CHLORIDE 0.9% FLUSH: 3.0000 mL | INTRAVENOUS | Status: DC | PRN

## 2019-07-11 MED ORDER — ORAL CARE MOUTH RINSE
15.0000 mL | Freq: Two times a day (BID) | OROMUCOSAL | Status: DC
  Administered 2019-07-11 – 2019-07-12 (×2): 15 mL via OROMUCOSAL

## 2019-07-11 MED ORDER — APIXABAN 2.5 MG PO TABS
2.5000 mg | ORAL_TABLET | Freq: Two times a day (BID) | ORAL | Status: DC
  Administered 2019-07-11 – 2019-07-16 (×11): 2.5 mg via ORAL
  Filled 2019-07-11 (×12): qty 1

## 2019-07-11 MED ORDER — CHLORHEXIDINE GLUCONATE CLOTH 2 % EX PADS
6.0000 | MEDICATED_PAD | Freq: Every day | CUTANEOUS | Status: DC
  Administered 2019-07-12 – 2019-07-25 (×9): 6 via TOPICAL

## 2019-07-11 MED ORDER — ONDANSETRON HCL 4 MG/2ML IJ SOLN: 4.0000 mg | Freq: Four times a day (QID) | INTRAMUSCULAR | Status: DC | PRN

## 2019-07-11 MED ORDER — FUROSEMIDE 10 MG/ML IJ SOLN
20.0000 mg | Freq: Two times a day (BID) | INTRAMUSCULAR | Status: DC
  Administered 2019-07-11 – 2019-07-13 (×4): 20 mg via INTRAVENOUS
  Filled 2019-07-11 (×4): qty 2

## 2019-07-11 MED ORDER — FUROSEMIDE 10 MG/ML IJ SOLN
20.0000 mg | Freq: Every day | INTRAMUSCULAR | Status: DC
  Administered 2019-07-11: 20 mg via INTRAVENOUS
  Filled 2019-07-11: qty 4

## 2019-07-11 MED ORDER — APIXABAN 2.5 MG PO TABS: 2.5000 mg | ORAL_TABLET | Freq: Two times a day (BID) | ORAL | Status: DC

## 2019-07-11 MED ORDER — ALBUTEROL SULFATE (2.5 MG/3ML) 0.083% IN NEBU
2.5000 mg | INHALATION_SOLUTION | Freq: Four times a day (QID) | RESPIRATORY_TRACT | Status: DC | PRN
  Administered 2019-07-14: 2.5 mg via RESPIRATORY_TRACT
  Filled 2019-07-11: qty 3

## 2019-07-11 MED ORDER — UMECLIDINIUM-VILANTEROL 62.5-25 MCG/INH IN AEPB
1.0000 | INHALATION_SPRAY | Freq: Every day | RESPIRATORY_TRACT | Status: DC
  Administered 2019-07-12 – 2019-07-24 (×12): 1 via RESPIRATORY_TRACT
  Filled 2019-07-11 (×2): qty 14

## 2019-07-11 MED ORDER — OXYCODONE HCL 5 MG PO TABS
5.0000 mg | ORAL_TABLET | Freq: Four times a day (QID) | ORAL | Status: DC | PRN
  Administered 2019-07-11 – 2019-07-12 (×2): 5 mg via ORAL
  Filled 2019-07-11 (×3): qty 1

## 2019-07-11 MED ORDER — FERROUS SULFATE 325 (65 FE) MG PO TABS
650.0000 mg | ORAL_TABLET | Freq: Every day | ORAL | Status: DC
  Administered 2019-07-11 – 2019-07-16 (×6): 650 mg via ORAL
  Filled 2019-07-11 (×6): qty 2

## 2019-07-11 MED ORDER — SODIUM CHLORIDE 0.9% FLUSH
3.0000 mL | Freq: Two times a day (BID) | INTRAVENOUS | Status: DC
  Administered 2019-07-12 – 2019-07-16 (×6): 3 mL via INTRAVENOUS

## 2019-07-11 MED ORDER — ALBUTEROL SULFATE HFA 108 (90 BASE) MCG/ACT IN AERS: 2.0000 | INHALATION_SPRAY | Freq: Four times a day (QID) | RESPIRATORY_TRACT | Status: DC | PRN

## 2019-07-11 MED ORDER — SODIUM CHLORIDE 0.9 % IV SOLN: 250.0000 mL | INTRAVENOUS | Status: DC | PRN

## 2019-07-11 MED ORDER — MUPIROCIN 2 % EX OINT
1.0000 "application " | TOPICAL_OINTMENT | Freq: Two times a day (BID) | CUTANEOUS | Status: AC
  Administered 2019-07-11 – 2019-07-16 (×10): 1 via NASAL
  Filled 2019-07-11: qty 22

## 2019-07-11 MED ORDER — VITAMIN D3 25 MCG (1000 UNIT) PO TABS
2000.0000 [IU] | ORAL_TABLET | Freq: Every day | ORAL | Status: DC
  Administered 2019-07-11 – 2019-07-16 (×6): 2000 [IU] via ORAL
  Filled 2019-07-11 (×6): qty 2

## 2019-07-11 MED ORDER — PANTOPRAZOLE SODIUM 40 MG PO TBEC
40.0000 mg | DELAYED_RELEASE_TABLET | Freq: Every day | ORAL | Status: DC
  Administered 2019-07-11 – 2019-07-16 (×6): 40 mg via ORAL
  Filled 2019-07-11 (×6): qty 1

## 2019-07-11 NOTE — Progress Notes (Signed)
  Echocardiogram 2D Echocardiogram has been performed.  [name] [name] 07/11/2019, 2:03 PM

## 2019-07-11 NOTE — ED Notes (Signed)
Pt moved over to a hospital bed

## 2019-07-11 NOTE — ED Notes (Signed)
Pt Son is doctor in [place], Dr Alvina Filbert, can be reached at [phone], for any updates.

## 2019-07-11 NOTE — Progress Notes (Signed)
PROGRESS NOTE  [name] [name] [id number] DOB: July 22, 1924 DOA: 07/10/2019 PCP: Lawerance Cruel, MD  HPI/Recap of past 24 hours: HPI from Dr Ara Kussmaul [name] [name] is a 86 y.o. male with a known history of Afib with PPM, CKD3, COPD,  presents to the emergency department for evaluation of weakness, SOB, LE edema.  Patient was in a usual state of health until about 10 days ago when he started to become progressively weaker, with worsening LE edema and now SOB.  Lives at home with his wife who is 3, was ambulating with a walker, but now needs assistance.  He did sustain a fall 3 weeks ago but recovered. In the ER he was found to be in afib with RVR, increased vascular congestion. Pt was given Lasix and started on a Cardizem drip.  Foley cath put out 300cc dark yellow urine so far. Patient has been taking medication as prescribed and there has been no recent change in medication or diet.  No recent antibiotics.  There has been no recent illness, hospitalizations, travel or sick contacts.  Patient admitted for further management.     Today, patient still noted to be SOB, with generalized weakness, denies any chest pain, abdominal pain, nausea/vomiting, fever/chills.  Noted BLE edema.    Assessment/Plan: Active Problems:   Acute exacerbation of CHF (congestive heart failure) (HCC)   Paroxysmal A. fib with RVR Heart rate better controlled, on Cardizem drip TSH WNL Plan to titrate off Cardizem drip (due to confirmed CHF, may need to switch to maybe amiodarone), D/C PO cardizem Continue Eliquis Telemetry, admit to stepdown Cardiology consulted, appreciate recs  New onset acute systolic HF Patient presents with shortness of breath, BLE edema BNP elevated 1038 Troponin mildly elevated with a flat trend, 68-73 Chest x-ray suggestive of vascular congestion Echo showed EF of 15 to 20%, global hypokinesis, moderately elevated pulmonary artery systolic pressure, [id number] mmhg, moderate to  severe mitral valve regurgitation Cardiology consulted Continue Lasix Strict I's and O's, daily weights  Elevated troponin Chest pain-free Likely 2/2 demand ischemia from above Troponin mildly elevated with a flat trend, 68-73 EKG shows A. Fib Cardiology consulted, telemetry  AKI on CKD stage 3B Likely 2/2 above/renal congestion Continue diuresis Daily BMP  Generalized weakness/debility/fall Likely 2/2 HF CT head unremarkable PT/OT  Elevated D-dimer D-dimer 1.6 We will hold off on CTA chest for now due to CKD, will order lower extremity Doppler and consider VQ scan Patient already on Eliquis, continue  Bilateral lower extremity edema/wound Left leg with weeping of serous drainage Lower extremity Doppler pending Wound care consult  History of COPD Continue nebs as needed, Flonase, prednisone  History of GERD Continue Protonix        Malnutrition Type:      Malnutrition Characteristics:      Nutrition Interventions:       Estimated body mass index is 22.24 kg/m as calculated from the following:   Height as of 06/27/19: 5\' 10"  (1.778 m).   Weight as of 06/27/19: 70.3 kg.     Code Status: DNR  Family Communication: Discussed with patient, plan to discuss with son  Disposition Plan: Status is: Inpatient  Remains inpatient appropriate because:Inpatient level of care appropriate due to severity of illness   Dispo: The patient is from: Home              Anticipated d/c is to: SNF              Anticipated d/c date is:  2 days              Patient currently is not medically stable to d/c.  New onset HF, requiring IV diuresis, A. fib with RVR on Cardizem drip     Consultants:  Cardiology  Procedures:  None  Antimicrobials:  None  DVT prophylaxis: Apixaban   Objective: Vitals:   07/11/19 1300 07/11/19 1330 07/11/19 1400 07/11/19 1454  BP: 110/71 (!) 125/106 104/77 110/74  Pulse: (!) 38   85  Resp: (!) 23 (!) 21 19 16   Temp:       TempSrc:      SpO2: (!) 87%   97%    Intake/Output Summary (Last 24 hours) at 07/11/2019 1559 Last data filed at 07/11/2019 1143 Gross per 24 hour  Intake --  Output 1250 ml  Net -1250 ml   There were no vitals filed for this visit.  Exam:  General: NAD, chronically ill-appearing, frail, HOH  Cardiovascular: S1, S2 present  Respiratory:  Bibasilar crackles noted  Abdomen: Soft, nontender, nondistended, bowel sounds present  Musculoskeletal: 2+ bilateral pedal edema noted, LLE with serous weeping noted  Skin:  As mentioned above  Psychiatry: Normal mood   Data Reviewed: CBC: Recent Labs  Lab 07/10/19 1620  WBC 11.6*  NEUTROABS 10.8*  HGB 12.5*  HCT 41.7  MCV 112.7*  PLT [id number]*   Basic Metabolic Panel: Recent Labs  Lab 07/10/19 1620 07/11/19 0001 07/11/19 0537  NA 142  --  141  K 4.5  --  3.8  CL 104  --  102  CO2 25  --  27  GLUCOSE 124*  --  106*  BUN 45*  --  47*  CREATININE 1.42*  --  1.34*  CALCIUM 8.7*  --  8.7*  MG  --  2.2  --    GFR: CrCl cannot be calculated (Unknown ideal weight.). Liver Function Tests: Recent Labs  Lab 07/10/19 1620  AST 38  ALT 30  ALKPHOS 81  BILITOT 1.9*  PROT 5.7*  ALBUMIN 3.4*   No results for input(s): LIPASE, AMYLASE in the last 168 hours. No results for input(s): AMMONIA in the last 168 hours. Coagulation Profile: No results for input(s): INR, PROTIME in the last 168 hours. Cardiac Enzymes: No results for input(s): CKTOTAL, CKMB, CKMBINDEX, TROPONINI in the last 168 hours. BNP (last 3 results) No results for input(s): PROBNP in the last 8760 hours. HbA1C: No results for input(s): HGBA1C in the last 72 hours. CBG: No results for input(s): GLUCAP in the last 168 hours. Lipid Profile: No results for input(s): CHOL, HDL, LDLCALC, TRIG, CHOLHDL, LDLDIRECT in the last 72 hours. Thyroid Function Tests: Recent Labs    07/11/19 0001  TSH 1.512   Anemia Panel: No results for input(s): VITAMINB12,  FOLATE, FERRITIN, TIBC, IRON, RETICCTPCT in the last 72 hours. Urine analysis:    Component Value Date/Time   COLORURINE AMBER (A) 07/10/2019 1635   APPEARANCEUR CLEAR 07/10/2019 1635   LABSPEC 1.027 07/10/2019 1635   PHURINE 5.0 07/10/2019 1635   GLUCOSEU NEGATIVE 07/10/2019 1635   HGBUR NEGATIVE 07/10/2019 1635   BILIRUBINUR NEGATIVE 07/10/2019 1635   KETONESUR NEGATIVE 07/10/2019 1635   PROTEINUR 100 (A) 07/10/2019 1635   UROBILINOGEN 1.0 08/06/2009 1001   NITRITE NEGATIVE 07/10/2019 1635   LEUKOCYTESUR NEGATIVE 07/10/2019 1635   Sepsis Labs: @LABRCNTIP (procalcitonin:4,lacticidven:4)  ) Recent Results (from the past 240 hour(s))  SARS Coronavirus 2 by RT PCR (hospital order, performed in [facility] hospital lab) Nasopharyngeal Nasopharyngeal  Swab     Status: None   Collection Time: 07/10/19  7:44 PM   Specimen: Nasopharyngeal Swab  Result Value Ref Range Status   SARS Coronavirus 2 NEGATIVE NEGATIVE Final    Comment: (NOTE) SARS-CoV-2 target nucleic acids are NOT DETECTED. The SARS-CoV-2 RNA is generally detectable in upper and lower respiratory specimens during the acute phase of infection. The lowest concentration of SARS-CoV-2 viral copies this assay can detect is 250 copies / mL. A negative result does not preclude SARS-CoV-2 infection and should not be used as the sole basis for treatment or other patient management decisions.  A negative result may occur with improper specimen collection / handling, submission of specimen other than nasopharyngeal swab, presence of viral mutation(s) within the areas targeted by this assay, and inadequate number of viral copies (<250 copies / mL). A negative result must be combined with clinical observations, patient history, and epidemiological information. Fact Sheet for Patients:   StrictlyIdeas.no Fact Sheet for Healthcare Providers: BankingDealers.co.za This test is not yet  approved or cleared  by the [place] FDA and has been authorized for detection and/or diagnosis of SARS-CoV-2 by FDA under an Emergency Use Authorization (EUA).  This EUA will remain in effect (meaning this test can be used) for the duration of the COVID-19 declaration under Section 564(b)(1) of the Act, 21 U.S.C. section 360bbb-3(b)(1), unless the authorization is terminated or revoked sooner. Performed at [facility], [place] [address], [place], [place] [address]       Studies: CT Head Wo Contrast  Result Date: 07/10/2019 CLINICAL DATA:  Progressive weakness fall 3 weeks ago EXAM: CT HEAD WITHOUT CONTRAST TECHNIQUE: Contiguous axial images were obtained from the base of the skull through the vertex without intravenous contrast. COMPARISON:  CT brain 12/17/2017 FINDINGS: Brain: No acute territorial infarction, hemorrhage or intracranial mass. Moderate atrophy. Mild chronic small vessel ischemic change of the white matter. Stable ventricle size. Slight asymmetric enlargement of left convexity extra-axial CSF density potentially due to chronic subdural effusion. Vascular: No hyperdense vessels.  Carotid vascular calcification Skull: Normal. Negative for fracture or focal lesion. Sinuses/Orbits: Postsurgical changes of the right maxillary sinus. Mucosal thickening. Other: None IMPRESSION: 1. No CT evidence for acute intracranial abnormality. 2. Atrophy and chronic small vessel ischemic change of the white matter Electronically Signed   By: Donavan Foil M.D.   On: 07/10/2019 18:09   CT Cervical Spine Wo Contrast  Result Date: 07/10/2019 CLINICAL DATA:  Progressive weakness, history of fall 3 weeks ago EXAM: CT CERVICAL SPINE WITHOUT CONTRAST TECHNIQUE: Multidetector CT imaging of the cervical spine was performed without intravenous contrast. Multiplanar CT image reconstructions were also generated. COMPARISON:  CT 12/17/2017 FINDINGS: Alignment: Trace retrolisthesis C4 on C5  and anterolisthesis C7 on T1 without change. Facet alignment is maintained Skull base and vertebrae: No acute fracture. No primary bone lesion or focal pathologic process. Soft tissues and spinal canal: No prevertebral fluid or swelling. No visible canal hematoma. Disc levels: Moderate severe degenerative changes throughout the cervical spine with multiple level disc space narrowing and osteophyte. Facet degenerative change at multiple levels with multiple level bilateral foraminal stenosis. Upper chest: Bilateral pleural effusions Other: None IMPRESSION: 1. Diffuse degenerative changes without acute fracture 2. Pleural effusions at the apices Electronically Signed   By: Donavan Foil M.D.   On: 07/10/2019 18:14   DG Chest Port 1 View  Result Date: 07/10/2019 CLINICAL DATA:  Generalized weakness, bilateral lower extremity edema EXAM: PORTABLE CHEST 1 VIEW  COMPARISON:  06/20/2019 FINDINGS: Single frontal view of the chest demonstrates increased veiling opacities at the lung bases consistent with progressive consolidation and enlarging bilateral pleural effusions. Increased central vascular congestion. Cardiac silhouette is stable. No pneumothorax. Dual lead pacer unchanged. IMPRESSION: 1. Worsening volume status with progressive congestive heart failure. Electronically Signed   By: Randa Ngo M.D.   On: 07/10/2019 16:57    Scheduled Meds: . apixaban  2.5 mg Oral BID  . cholecalciferol  2,000 Units Oral Daily  . diltiazem  120 mg Oral Daily  . ferrous sulfate  650 mg Oral Q breakfast  . furosemide  20 mg Intravenous Daily  . lisinopril  2.5 mg Oral Daily  . mirabegron ER  50 mg Oral Daily  . pantoprazole  40 mg Oral QAC breakfast  . predniSONE  10 mg Oral Q breakfast  . sodium chloride flush  3 mL Intravenous Q12H  . umeclidinium-vilanterol  1 puff Inhalation Daily  . vitamin B-12  500 mcg Oral Q M,W,F    Continuous Infusions: . sodium chloride    . diltiazem (CARDIZEM) infusion 10 mg/hr  (07/11/19 0615)     LOS: 1 day     Alma Friendly, MD [facility]  If 7PM-7AM, please contact night-coverage [email or web address] 07/11/2019, 3:59 PM

## 2019-07-11 NOTE — ED Notes (Signed)
Echo in process 

## 2019-07-12 ENCOUNTER — Ambulatory Visit: Payer: Self-pay | Admitting: Neurology

## 2019-07-12 ENCOUNTER — Encounter: Payer: Self-pay | Admitting: Neurology

## 2019-07-12 ENCOUNTER — Inpatient Hospital Stay (HOSPITAL_COMMUNITY): Payer: Medicare Other

## 2019-07-12 DIAGNOSIS — R609 Edema, unspecified: Secondary | ICD-10-CM

## 2019-07-12 DIAGNOSIS — I5041 Acute combined systolic (congestive) and diastolic (congestive) heart failure: Secondary | ICD-10-CM

## 2019-07-12 DIAGNOSIS — M7989 Other specified soft tissue disorders: Secondary | ICD-10-CM

## 2019-07-12 DIAGNOSIS — N1832 Chronic kidney disease, stage 3b: Secondary | ICD-10-CM

## 2019-07-12 DIAGNOSIS — Z515 Encounter for palliative care: Secondary | ICD-10-CM

## 2019-07-12 DIAGNOSIS — Z7189 Other specified counseling: Secondary | ICD-10-CM

## 2019-07-12 DIAGNOSIS — I4819 Other persistent atrial fibrillation: Secondary | ICD-10-CM

## 2019-07-12 DIAGNOSIS — I959 Hypotension, unspecified: Secondary | ICD-10-CM

## 2019-07-12 LAB — CBC WITH DIFFERENTIAL/PLATELET
Abs Immature Granulocytes: 0.06 10*3/uL (ref 0.00–0.07)
Basophils Absolute: 0 10*3/uL (ref 0.0–0.1)
Basophils Relative: 0 %
Eosinophils Absolute: 0 10*3/uL (ref 0.0–0.5)
Eosinophils Relative: 0 %
HCT: 40.8 % (ref 39.0–52.0)
Hemoglobin: 12.7 g/dL — ABNORMAL LOW (ref 13.0–17.0)
Immature Granulocytes: 1 %
Lymphocytes Relative: 5 %
Lymphs Abs: 0.5 10*3/uL — ABNORMAL LOW (ref 0.7–4.0)
MCH: 34.3 pg — ABNORMAL HIGH (ref 26.0–34.0)
MCHC: 31.1 g/dL (ref 30.0–36.0)
MCV: 110.3 fL — ABNORMAL HIGH (ref 80.0–100.0)
Monocytes Absolute: 0.5 10*3/uL (ref 0.1–1.0)
Monocytes Relative: 5 %
Neutro Abs: 10.3 10*3/uL — ABNORMAL HIGH (ref 1.7–7.7)
Neutrophils Relative %: 89 %
Platelets: 115 10*3/uL — ABNORMAL LOW (ref 150–400)
RBC: 3.7 MIL/uL — ABNORMAL LOW (ref 4.22–5.81)
RDW: 15 % (ref 11.5–15.5)
WBC: 11.5 10*3/uL — ABNORMAL HIGH (ref 4.0–10.5)
nRBC: 0 % (ref 0.0–0.2)

## 2019-07-12 LAB — BASIC METABOLIC PANEL
Anion gap: 15 (ref 5–15)
BUN: 45 mg/dL — ABNORMAL HIGH (ref 8–23)
CO2: 25 mmol/L (ref 22–32)
Calcium: 8.5 mg/dL — ABNORMAL LOW (ref 8.9–10.3)
Chloride: 102 mmol/L (ref 98–111)
Creatinine, Ser: 1.28 mg/dL — ABNORMAL HIGH (ref 0.61–1.24)
GFR calc Af Amer: 56 mL/min — ABNORMAL LOW (ref >60)
GFR calc non Af Amer: 48 mL/min — ABNORMAL LOW (ref >60)
Glucose, Bld: 105 mg/dL — ABNORMAL HIGH (ref 70–99)
Potassium: 4 mmol/L (ref 3.5–5.1)
Sodium: 142 mmol/L (ref 135–145)

## 2019-07-12 LAB — URINE CULTURE

## 2019-07-12 MED ORDER — ENSURE ENLIVE PO LIQD
237.0000 mL | ORAL | Status: DC
  Administered 2019-07-12 – 2019-07-25 (×10): 237 mL via ORAL

## 2019-07-12 MED ORDER — METOPROLOL SUCCINATE ER 50 MG PO TB24
50.0000 mg | ORAL_TABLET | Freq: Every day | ORAL | Status: DC
  Administered 2019-07-12 – 2019-07-16 (×5): 50 mg via ORAL
  Filled 2019-07-12 (×5): qty 2

## 2019-07-12 MED ORDER — DOXYCYCLINE HYCLATE 100 MG PO TABS
100.0000 mg | ORAL_TABLET | Freq: Two times a day (BID) | ORAL | Status: DC
  Administered 2019-07-12 – 2019-07-16 (×9): 100 mg via ORAL
  Filled 2019-07-12 (×9): qty 1

## 2019-07-12 MED ORDER — AMIODARONE HCL IN DEXTROSE 360-4.14 MG/200ML-% IV SOLN
30.0000 mg/h | INTRAVENOUS | Status: DC
  Administered 2019-07-13: 30 mg/h via INTRAVENOUS
  Filled 2019-07-12 (×2): qty 200

## 2019-07-12 MED ORDER — ADULT MULTIVITAMIN W/MINERALS CH
1.0000 | ORAL_TABLET | Freq: Every day | ORAL | Status: DC
  Administered 2019-07-12 – 2019-07-16 (×5): 1 via ORAL
  Filled 2019-07-12 (×5): qty 1

## 2019-07-12 MED ORDER — AMIODARONE HCL IN DEXTROSE 360-4.14 MG/200ML-% IV SOLN
60.0000 mg/h | INTRAVENOUS | Status: AC
  Administered 2019-07-12: 60 mg/h via INTRAVENOUS
  Filled 2019-07-12: qty 200

## 2019-07-12 NOTE — Progress Notes (Signed)
PROGRESS NOTE  [name] [name] [id number] DOB: 04/23/1924 DOA: 07/10/2019 PCP: [name] Cruel, MD  HPI/Recap of past 24 hours: HPI from Dr Ara Kussmaul [name] [name] is a 86 y.o. male with a known history of Afib with PPM, CKD3, COPD,  presents to the emergency department for evaluation of weakness, SOB, LE edema.  Patient was in a usual state of health until about 10 days ago when he started to become progressively weaker, with worsening LE edema and now SOB.  Lives at home with his wife who is 51, was ambulating with a walker, but now needs assistance.  He did sustain a fall 3 weeks ago but recovered. In the ER he was found to be in afib with RVR, increased vascular congestion. Pt was given Lasix and started on a Cardizem drip.  Foley cath put out 300cc dark yellow urine so far. Patient has been taking medication as prescribed and there has been no recent change in medication or diet.  No recent antibiotics.  There has been no recent illness, hospitalizations, travel or sick contacts.  Patient admitted for further management.     Today, patient still reports some shortness of breath, still appears volume overloaded, denies any chest pain, abdominal pain, nausea/vomiting, fever/chills.  Reports generalized weakness, noted some lethargy.   Assessment/Plan: Active Problems:   Atrial fibrillation with RVR (HCC)   Acute exacerbation of CHF (congestive heart failure) (HCC)   Stage 3b chronic kidney disease   Acute combined systolic and diastolic heart failure (HCC)   Hypotension   Paroxysmal A. fib with RVR Heart rate better controlled TSH WNL Plan to titrate off Cardizem drip (due to confirmed CHF, may need to switch to maybe amiodarone) Cardiology on board, continue Eliquis, start amiodarone, switch to Toprol.  May need cardioversion Telemetry  New onset acute systolic HF/moderate aortic stenosis/moderate aortic regurgitation Patient presents with shortness of breath, BLE  edema BNP elevated 1038 Troponin mildly elevated with a flat trend, 68-73 Chest x-ray suggestive of vascular congestion Echo showed EF of 15 to 20%, global hypokinesis, moderately elevated pulmonary artery systolic pressure, [id number] mmhg, moderate to severe mitral valve regurgitation Cardiology consulted Continue Lasix Strict I's and O's, daily weights  Elevated troponin Chest pain-free Likely 2/2 demand ischemia from above Troponin mildly elevated with a flat trend, 68-73 EKG shows A. Fib Cardiology consulted, telemetry  AKI on CKD stage 3B Likely 2/2 above/renal congestion Continue diuresis Daily BMP  Generalized weakness/debility/fall Likely 2/2 HF CT head unremarkable PT/OT  Elevated D-dimer D-dimer 1.6 We will hold off on CTA chest for now due to CKD, lower extremity Doppler negative for DVT Patient already on Eliquis, continue  Bilateral lower extremity edema/wound Afebrile, with rising leukocytosis Left leg with weeping of serous drainage Lower extremity Doppler negative for DVT Wound care consult, recs appreciated Start patient on doxycycline  History of COPD Continue nebs as needed, Flonase, prednisone  History of GERD Continue Protonix        Malnutrition Type:  Nutrition Problem: Increased nutrient needs Etiology: acute illness   Malnutrition Characteristics:  Signs/Symptoms: estimated needs   Nutrition Interventions:  Interventions: Ensure Enlive (each supplement provides 350kcal and 20 grams of protein), MVI    Estimated body mass index is 21.89 kg/m as calculated from the following:   Height as of this encounter: 5\' 10"  (1.778 m).   Weight as of this encounter: 69.2 kg.     Code Status: DNR  Family Communication: Discussed with son who is a physician on  07/12/2019  Disposition Plan: Status is: Inpatient  Remains inpatient appropriate because:Inpatient level of care appropriate due to severity of illness   Dispo: The patient is  from: Home              Anticipated d/c is to: SNF              Anticipated d/c date is: 2 days              Patient currently is not medically stable to d/c.  New onset HF, requiring IV diuresis, A. fib with RVR on amiodarone drip     Consultants:  Cardiology  Procedures:  None  Antimicrobials:  Doxycycline  DVT prophylaxis: Apixaban   Objective: Vitals:   07/12/19 1700 07/12/19 1800 07/12/19 1821 07/12/19 1946  BP: 120/81 119/75    Pulse: 90  98   Resp: 17 (!) 22    Temp:    97.6 F (36.4 C)  TempSrc:    Axillary  SpO2: 98% 99%    Weight:      Height:        Intake/Output Summary (Last 24 hours) at 07/12/2019 1955 Last data filed at 07/12/2019 1827 Gross per 24 hour  Intake 3 ml  Output 900 ml  Net -897 ml   Filed Weights   07/11/19 1900 07/12/19 0412  Weight: 69.2 kg 69.2 kg    Exam:  General: NAD, chronically ill-appearing, frail, HOH  Cardiovascular: S1, S2 present  Respiratory:  Bibasilar crackles noted  Abdomen: Soft, nontender, nondistended, bowel sounds present  Musculoskeletal: 1+ bilateral pedal edema noted, LLE with serous weeping noted  Skin:  As mentioned above  Psychiatry: Normal mood   Data Reviewed: CBC: Recent Labs  Lab 07/10/19 1620 07/12/19 0258  WBC 11.6* 11.5*  NEUTROABS 10.8* 10.3*  HGB 12.5* 12.7*  HCT 41.7 40.8  MCV 112.7* 110.3*  PLT 130* [id number]*   Basic Metabolic Panel: Recent Labs  Lab 07/10/19 1620 07/11/19 0001 07/11/19 0537 07/12/19 0258  NA 142  --  141 142  K 4.5  --  3.8 4.0  CL 104  --  102 102  CO2 25  --  27 25  GLUCOSE 124*  --  106* 105*  BUN 45*  --  47* 45*  CREATININE 1.42*  --  1.34* 1.28*  CALCIUM 8.7*  --  8.7* 8.5*  MG  --  2.2  --   --    GFR: Estimated Creatinine Clearance: 35.3 mL/min (A) (by C-G formula based on SCr of 1.28 mg/dL (H)). Liver Function Tests: Recent Labs  Lab 07/10/19 1620  AST 38  ALT 30  ALKPHOS 81  BILITOT 1.9*  PROT 5.7*  ALBUMIN 3.4*   No  results for input(s): LIPASE, AMYLASE in the last 168 hours. No results for input(s): AMMONIA in the last 168 hours. Coagulation Profile: No results for input(s): INR, PROTIME in the last 168 hours. Cardiac Enzymes: No results for input(s): CKTOTAL, CKMB, CKMBINDEX, TROPONINI in the last 168 hours. BNP (last 3 results) No results for input(s): PROBNP in the last 8760 hours. HbA1C: No results for input(s): HGBA1C in the last 72 hours. CBG: No results for input(s): GLUCAP in the last 168 hours. Lipid Profile: No results for input(s): CHOL, HDL, LDLCALC, TRIG, CHOLHDL, LDLDIRECT in the last 72 hours. Thyroid Function Tests: Recent Labs    07/11/19 0001  TSH 1.512   Anemia Panel: No results for input(s): VITAMINB12, FOLATE, FERRITIN, TIBC, IRON, RETICCTPCT in the last 72  hours. Urine analysis:    Component Value Date/Time   COLORURINE AMBER (A) 07/10/2019 1635   APPEARANCEUR CLEAR 07/10/2019 1635   LABSPEC 1.027 07/10/2019 1635   PHURINE 5.0 07/10/2019 1635   GLUCOSEU NEGATIVE 07/10/2019 1635   HGBUR NEGATIVE 07/10/2019 1635   BILIRUBINUR NEGATIVE 07/10/2019 1635   KETONESUR NEGATIVE 07/10/2019 1635   PROTEINUR 100 (A) 07/10/2019 1635   UROBILINOGEN 1.0 08/06/2009 1001   NITRITE NEGATIVE 07/10/2019 1635   LEUKOCYTESUR NEGATIVE 07/10/2019 1635   Sepsis Labs: @LABRCNTIP (procalcitonin:4,lacticidven:4)  ) Recent Results (from the past 240 hour(s))  Urine culture     Status: Abnormal   Collection Time: 07/10/19  4:35 PM   Specimen: Urine, Clean Catch  Result Value Ref Range Status   Specimen Description   Final    URINE, CLEAN CATCH Performed at [facility], [place] [address] [place], [place] [address]    Special Requests   Final    NONE Performed at [facility], [place] [address] [place], [place] [address]    Culture MULTIPLE SPECIES PRESENT, SUGGEST RECOLLECTION (A)  Final   Report Status 07/12/2019 FINAL  Final  SARS Coronavirus 2  by RT PCR (hospital order, performed in [facility] hospital lab) Nasopharyngeal Nasopharyngeal Swab     Status: None   Collection Time: 07/10/19  7:44 PM   Specimen: Nasopharyngeal Swab  Result Value Ref Range Status   SARS Coronavirus 2 NEGATIVE NEGATIVE Final    Comment: (NOTE) SARS-CoV-2 target nucleic acids are NOT DETECTED. The SARS-CoV-2 RNA is generally detectable in upper and lower respiratory specimens during the acute phase of infection. The lowest concentration of SARS-CoV-2 viral copies this assay can detect is 250 copies / mL. A negative result does not preclude SARS-CoV-2 infection and should not be used as the sole basis for treatment or other patient management decisions.  A negative result may occur with improper specimen collection / handling, submission of specimen other than nasopharyngeal swab, presence of viral mutation(s) within the areas targeted by this assay, and inadequate number of viral copies (<250 copies / mL). A negative result must be combined with clinical observations, patient history, and epidemiological information. Fact Sheet for Patients:   StrictlyIdeas.no Fact Sheet for Healthcare Providers: BankingDealers.co.za This test is not yet approved or cleared  by the [place] FDA and has been authorized for detection and/or diagnosis of SARS-CoV-2 by FDA under an Emergency Use Authorization (EUA).  This EUA will remain in effect (meaning this test can be used) for the duration of the COVID-19 declaration under Section 564(b)(1) of the Act, 21 U.S.C. section 360bbb-3(b)(1), unless the authorization is terminated or revoked sooner. Performed at [facility], [place] [address] [place], [place] [address]   MRSA PCR Screening     Status: Abnormal   Collection Time: 07/11/19  7:43 PM   Specimen: Nasopharyngeal  Result Value Ref Range Status   MRSA by PCR POSITIVE (A) NEGATIVE Final     Comment:        The GeneXpert MRSA Assay (FDA approved for NASAL specimens only), is one component of a comprehensive MRSA colonization surveillance program. It is not intended to diagnose MRSA infection nor to guide or monitor treatment for MRSA infections. RESULT CALLED TO, READ BACK BY AND VERIFIED WITH: S.NESMITH AT 2220 ON 07/11/19 BY N.THOMPSON Performed at [facility], [place] [address] [place], [place] [address]       Studies: VAS [place] LOWER EXTREMITY VENOUS (DVT)  Result  Date: 07/12/2019  Lower Venous DVTStudy Indications: Swelling, and Edema.  Comparison Study: NO PRIOR Performing Technologist: Abram Sander RVS  Examination Guidelines: A complete evaluation includes B-mode imaging, spectral Doppler, color Doppler, and power Doppler as needed of all accessible portions of each vessel. Bilateral testing is considered an integral part of a complete examination. Limited examinations for reoccurring indications may be performed as noted. The reflux portion of the exam is performed with the patient in reverse Trendelenburg.  +---------+---------------+---------+-----------+----------+--------------+ RIGHT    CompressibilityPhasicitySpontaneityPropertiesThrombus Aging +---------+---------------+---------+-----------+----------+--------------+ CFV      Full           Yes      Yes                                 +---------+---------------+---------+-----------+----------+--------------+ SFJ      Full                                                        +---------+---------------+---------+-----------+----------+--------------+ FV Prox  Full                                                        +---------+---------------+---------+-----------+----------+--------------+ FV Mid   Full                                                        +---------+---------------+---------+-----------+----------+--------------+ FV DistalFull                                                         +---------+---------------+---------+-----------+----------+--------------+ PFV      Full                                                        +---------+---------------+---------+-----------+----------+--------------+ POP      Full           Yes      Yes                                 +---------+---------------+---------+-----------+----------+--------------+ PTV      Full                                                        +---------+---------------+---------+-----------+----------+--------------+ PERO  Not visualized +---------+---------------+---------+-----------+----------+--------------+   +---------+---------------+---------+-----------+----------+--------------+ LEFT     CompressibilityPhasicitySpontaneityPropertiesThrombus Aging +---------+---------------+---------+-----------+----------+--------------+ CFV      Full           Yes      Yes                                 +---------+---------------+---------+-----------+----------+--------------+ SFJ      Full                                                        +---------+---------------+---------+-----------+----------+--------------+ FV Prox  Full                                                        +---------+---------------+---------+-----------+----------+--------------+ FV Mid   Full                                                        +---------+---------------+---------+-----------+----------+--------------+ FV DistalFull                                                        +---------+---------------+---------+-----------+----------+--------------+ PFV      Full                                                        +---------+---------------+---------+-----------+----------+--------------+ POP      Full           Yes      Yes                                  +---------+---------------+---------+-----------+----------+--------------+ PTV      Full                                                        +---------+---------------+---------+-----------+----------+--------------+ PERO                                                  Not visualized +---------+---------------+---------+-----------+----------+--------------+     Summary: BILATERAL: - No evidence of deep vein thrombosis seen in the lower extremities, bilaterally. -   *See table(s) above for measurements and observations. Electronically signed by Servando Snare MD on 07/12/2019 at 3:37:31 PM.    Final     Scheduled Meds: .  apixaban  2.5 mg Oral BID  . Chlorhexidine Gluconate Cloth  6 each Topical Q0600  . cholecalciferol  2,000 Units Oral Daily  . doxycycline  100 mg Oral Q12H  . feeding supplement (ENSURE ENLIVE)  237 mL Oral Q24H  . ferrous sulfate  650 mg Oral Q breakfast  . furosemide  20 mg Intravenous BID  . metoprolol succinate  50 mg Oral Daily  . mirabegron ER  50 mg Oral Daily  . multivitamin with minerals  1 tablet Oral Daily  . mupirocin ointment  1 application Nasal BID  . pantoprazole  40 mg Oral QAC breakfast  . predniSONE  10 mg Oral Q breakfast  . sodium chloride flush  3 mL Intravenous Q12H  . umeclidinium-vilanterol  1 puff Inhalation Daily  . vitamin B-12  500 mcg Oral Q M,W,F    Continuous Infusions: . sodium chloride    . amiodarone 60 mg/hr (07/12/19 1827)   Followed by  . amiodarone       LOS: 2 days     Alma Friendly, MD [facility]  If 7PM-7AM, please contact night-coverage [email or web address] 07/12/2019, 7:55 PM

## 2019-07-12 NOTE — Consult Note (Signed)
[place] Nurse Consult Note: Reason for Consult: weeping and edema Patient with self reported history of venous stasis and graft placed 2 years ago to the LLE medial malleolar region.  Reports has compression stockings at home but having difficulty with donning recently. Hemosiderin staining noted R>L.  No open wounds currently. No active weeping Wound type: none Elevate legs as much as possible. Reinitiate use of compression stockings at DC.  Appears patient may need SNF placement; they can assist with donning and doffing daily.  No other wound care needs. [place], [employer], [place]

## 2019-07-12 NOTE — TOC Initial Note (Signed)
Transition of Care [facility]) - Initial/Assessment Note    Patient Details  Name: [name] [name] MRN: [id number] Date of Birth: January 02, 1925  Transition of [facility]) CM/SW Contact:    Leeroy Cha, RN Phone Number: 07/12/2019, 8:27 AM  Clinical Narrative:                 From home lives with spouse adult children involved/ plans to go back home/ CXR shoed increased volume status and chf.  Iv cardizen.  Expected Discharge Plan: [place] Barriers to Discharge: Continued Medical Work up   Patient Goals and CMS Choice Patient states their goals for this hospitalization and ongoing recovery are:: to go home CMS Medicare.gov Compare Post Acute Care list provided to:: Patient    Expected Discharge Plan and Services Expected Discharge Plan: [place]   Discharge Planning Services: CM Consult   Living arrangements for the past 2 months: Single Family Home                                      Prior Living Arrangements/Services Living arrangements for the past 2 months: Single Family Home Lives with:: Adult Children, Spouse Patient language and need for interpreter reviewed:: No Do you feel safe going back to the place where you live?: Yes      Need for Family Participation in Patient Care: Yes (Comment) Care giver support system in place?: Yes (comment)   Criminal Activity/Legal Involvement Pertinent to Current Situation/Hospitalization: No - Comment as needed  Activities of Daily Living Home Assistive Devices/Equipment: Eyeglasses, Dentures (specify type), Walker (specify type), Wheelchair(full set dentures) ADL Screening (condition at time of admission) Patient's cognitive ability adequate to safely complete daily activities?: No Is the patient deaf or have difficulty hearing?: Yes Does the patient have difficulty seeing, even when wearing glasses/contacts?: Yes Does the patient have difficulty concentrating, remembering, or  making decisions?: Yes(gets worse at night time) Patient able to express need for assistance with ADLs?: Yes Does the patient have difficulty dressing or bathing?: Yes Independently performs ADLs?: No Communication: Independent Dressing (OT): Needs assistance Is this a change from baseline?: Pre-admission baseline Grooming: Needs assistance Is this a change from baseline?: Pre-admission baseline Feeding: Independent Bathing: Needs assistance Is this a change from baseline?: Pre-admission baseline Toileting: Needs assistance Is this a change from baseline?: Pre-admission baseline In/Out Bed: Needs assistance Is this a change from baseline?: Pre-admission baseline Walks in Home: Dependent Is this a change from baseline?: Pre-admission baseline Does the patient have difficulty walking or climbing stairs?: Yes Weakness of Legs: Both Weakness of Arms/Hands: Both  Permission Sought/Granted                  Emotional Assessment Appearance:: Appears stated age Attitude/Demeanor/Rapport: Engaged Affect (typically observed): Calm Orientation: : Oriented to Self, Oriented to Place, Oriented to  Time, Oriented to Situation Alcohol / Substance Use: Not Applicable Psych Involvement: No (comment)  Admission diagnosis:  Acute exacerbation of CHF (congestive heart failure) ([place]) [I50.9] Atrial fibrillation with RVR (HCC) [I48.91] Acute congestive heart failure, unspecified heart failure type ([place]) [I50.9] Patient Active Problem List   Diagnosis Date Noted  . Acute exacerbation of CHF (congestive heart failure) ([place]) 07/10/2019  . History of cardioversion 04/29/2019  . Sacral fracture, closed ([place]) 04/13/2018  . PCO (posterior capsular opacification), bilateral 05/26/2017  . Weakness of right leg 04/20/2017  . 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 Exudative age-related macular degeneration of right eye with active choroidal  neovascularization ([place]) 09/30/2016  . Pseudophakia of both eyes 09/30/2016  . Impingement syndrome of right shoulder 06/16/2016  . Impingement syndrome of left shoulder 06/16/2016  . Bilateral leg edema 06/14/2016  . Dizziness and giddiness 06/26/2015  . Abnormality of gait 06/26/2015  . Anxiety 04/14/2015  . GERD (gastroesophageal reflux disease) 04/03/2015  . OSA (obstructive sleep apnea) 04/03/2015  . HLD (hyperlipidemia) 04/03/2015  . SOB (shortness of breath)   . Left elbow fracture 03/29/2015  . Dizziness 01/01/2015  . LBP (low back pain) 01/01/2015  . Malignant neoplasm of prostate ([place]) 01/01/2015  . BP (high blood pressure) 01/01/2015  . DOE (dyspnea on exertion) 10/26/2013  . Essential hypertension, benign 10/26/2013  . Post-traumatic wound infection 10/17/2012  . Cellulitis 10/17/2012  . Atrial fibrillation ([place]) 10/22/2011  . DVT, lower extremity, distal, chronic ([place]) 10/22/2011  . Pacemaker 10/19/2011  . Prostate cancer ([place]) 10/19/2011  . COPD (chronic obstructive pulmonary disease) with emphysema ([place]) 02/04/2011  . Anemia 12/10/2010   PCP:  Lawerance Cruel, MD Pharmacy:   [facility] [address], [place] - [address] [place] [address] Phone: [phone] Fax: [phone]     Social Determinants of Health (SDOH) Interventions    Readmission Risk Interventions No flowsheet data found.

## 2019-07-12 NOTE — Consult Note (Addendum)
Cardiology Consultation:   Patient ID: DEMICO [name] MRN: [id number]; DOB: [birth year]  Admit date: 07/10/2019 Date of Consult: 07/12/2019  Primary Care Provider: Lawerance Cruel, MD Primary Cardiologist: Larae Grooms, MD  Primary Electrophysiologist:  Virl Axe, MD    Patient Profile:   [name] [name] is a 86 y.o. male with a hx of sick sinus syndrome status post pacemaker, persistent atrial fibrillation, CKD 3, COPD, prior DVT and hypertension who is being seen today for the evaluation of acute systolic and diastolic heart failure and weakness at the request of Dr. Horris Latino.  History of Present Illness:   [name] [name] presented to the hospital due to progressive weakness.  He notes for the last couple weeks he has been increasingly short of breath.  He has some lower extremity edema that is chronic.  He is unsure that it is any worse than his baseline.  He has noted some orthopnea.  He has been increasingly weaker and needing more assistance with his ADLs.  He had a fall 3 weeks ago.  He has not experienced any chest pain or pressure.  He reports that the fall was mechanical.  In the ED he was noted to be in atrial fibrillation with heart rates in the 140s.  BNP was elevated to 1000.  High-sensitivity troponin was elevated to 73 and flat.  EKG revealed atrial fibrillation without ischemic changes.  An echocardiogram was obtained which revealed LVEF 15 to 20% with global hypokinesis.  Right ventricular function was moderately reduced.  There was moderate aortic stenosis and given a dimensionless index of 0.34, it was felt not to be low flow-low gradient severe aortic stenosis.  Cardiology was consulted for further management.  [name] [name] underwent cardioversion 03/2019.  It took 2 shocks and then he was in sinus rhythm with frequent PACs and PVCs.  His most recent device interrogation 05/08/2019 showed that he had 175 ventricular high rate episodes and 200 5 AM the episodes  noted.  They were thought to be one-to-one SVT and he was asymptomatic.  He had one episode of atrial fibrillation since his cardioversion.   He saw Dr. Caryl Comes at that time and was doing well.  He was noted to be volume overloaded but due to his renal dysfunction his diuretics were not pushed any more aggressively.     Past Medical History:  Diagnosis Date  . Abdominal hernia   . Abnormality of gait    due to low back pain , uses  cane and walker  . Anemia of chronic disease    Dr. Alen Blew  . Arthritis   . Bilateral lower extremity edema    wears TED hose  . Cancer [facility])    prostate- treated with radiation  . Cardiac pacemaker in situ 10/11/2006   medtronic  . Chronic iron deficiency anemia oncologist/ hematoloist-- dr Alen Blew   treatment IV Iron infusions  . Chronic low back pain   . CKD (chronic kidney disease), stage III   . COPD with emphysema [facility])    pulmologist-  dr Elsworth Soho  . DDD (degenerative disc disease), lumbosacral   . Degenerative scoliosis   . Dyspnea    with exertion  . Full dentures   . GERD (gastroesophageal reflux disease)   . Gross hematuria   . Headache    history of migraines- "way in the past"  . Hereditary and idiopathic peripheral neuropathy    bilateral lower leg  . Hiatal hernia   . History of DVT of lower  extremity 10/19/2011   chronic distal popliteal right lower extremity  . History of external beam radiation therapy 2008   prostate cancer  . History of pancreatitis 2013  . History of pneumothorax 09/2006   iatrogenic-- in setting cardiac pacemaker placement  . History of prostate cancer 2008   s/p  external radiation therapy  . Hypertension   . Injury of left ulnar nerve 06/2015   Dr. Jannifer Franklin   . Left carpal tunnel syndrome   . Lesion of bladder   . Macular degeneration of both eyes   . Macular degeneration of right eye   . Mild mitral regurgitation   . Mild mitral regurgitation    and trace AI 07/22/08 echo and 10/2010  . Mild pulmonary  hypertension ([place])   . Nocturia   . OSA on CPAP   . PAF (paroxysmal atrial fibrillation) ([place]) 10/2011   1st episode documented 09/ 2013 admission for gallstons/ pancreatitis and prior to cholecystectomy , went back in NSR without interventeion  . Pancreatitis 10/2011   gallstone induced  . Presence of permanent cardiac pacemaker   . Prostate cancer ([place])    Dr. Karsten Ro  . Sinus node dysfunction ([facility])    s/p  cardiac pacemaker placement 10-11-2006  . Sinus node dysfunction ([facility])    pacemaker  . Spinal stenosis    L2  . Venous insufficiency   . Wears glasses   . Wears hearing aid in both ears     Past Surgical History:  Procedure Laterality Date  . BALLOON DILATION N/A 01/14/2015   Procedure: BALLOON DILATION;  Surgeon: Garlan Fair, MD;  Location: [facility];  Service: Endoscopy;  Laterality: N/A;  . BALLOON DILATION N/A 10/12/2017   Procedure: BALLOON DILATION;  Surgeon: Otis Brace, MD;  Location: [facility];  Service: Gastroenterology;  Laterality: N/A;  . BIOPSY  10/12/2017   Procedure: BIOPSY;  Surgeon: Otis Brace, MD;  Location: [facility];  Service: Gastroenterology;;  . CARDIAC CATHETERIZATION  08-29-2000   [place]   no significant obstructive CAD, intact globle LV size and systolic function with regional wall motion abnormalities noted,  ?coronary spasm producing wall motion abnormality, elevated CPK and chest pain (ef 55%)  . CARDIAC PACEMAKER PLACEMENT  10-11-2006    dr Leonia Reeves   W/  ATRIAL LEAD REVISION 10-12-2006   (medtronic)  . CARDIOVASCULAR STRESS TEST  11-18-2011    dr Irish Lack   Low risk nuclear study w/ no ishemia/  normal wall motion,  post-stress ef 48% (LVEF 56% on prior study 2008)  . CARDIOVERSION N/A 04/02/2019   Procedure: CARDIOVERSION;  Surgeon: Josue Hector, MD;  Location: [facility] ENDOSCOPY;  Service: Cardiovascular;  Laterality: N/A;  . CARPAL TUNNEL RELEASE Left 11/14/2014   Procedure: CARPAL TUNNEL RELEASE LEFT THUMB;  Surgeon: Daryll Brod, MD;  Location: [place];  Service: Orthopedics;  Laterality: Left;  ANESTHESIA:  IV REGIONAL FAB  . CATARACT EXTRACTION W/ INTRAOCULAR LENS  IMPLANT, BILATERAL    . CHOLECYSTECTOMY  10/23/2011  . CHOLECYSTECTOMY  10/23/2011   Procedure: CHOLECYSTECTOMY;  Surgeon: Rolm Bookbinder, MD;  Location: [place];  Service: General;  Laterality: N/A;  with intraoperative cholangiogram  . COLONOSCOPY    . ESOPHAGOGASTRODUODENOSCOPY (EGD) WITH PROPOFOL N/A 01/14/2015   Procedure: ESOPHAGOGASTRODUODENOSCOPY (EGD) WITH PROPOFOL;  Surgeon: Garlan Fair, MD;  Location: [facility];  Service: Endoscopy;  Laterality: N/A;  . ESOPHAGOGASTRODUODENOSCOPY (EGD) WITH PROPOFOL N/A 10/12/2017   Procedure: ESOPHAGOGASTRODUODENOSCOPY (EGD) WITH PROPOFOL;  Surgeon: Otis Brace, MD;  Location: [facility];  Service: Gastroenterology;  Laterality: N/A;  . I & D EXTREMITY Left 10/20/2012   Procedure: LEFT LEG IRRIGATION AND DEBRIDEMENT, AND WOUND VAC APPLICATION;  Surgeon: Marianna Payment, MD;  Location: [facility];  Service: Orthopedics;  Laterality: Left;  . I & D EXTREMITY Left 10/22/2012   Procedure: IRRIGATION AND DEBRIDEMENT EXTREMITY;  Surgeon: Marianna Payment, MD;  Location: [facility];  Service: Orthopedics;  Laterality: Left;  . INGUINAL HERNIA REPAIR Left yrs ago  . LAPAROSCOPIC ASSISTED VENTRAL HERNIA REPAIR  08-08-2009   dr Excell Seltzer   AND OPEN REPAIR RECURRENT LEFT INGUINAL HERNIA  . LAPAROSCOPIC INGUINAL HERNIA REPAIR Left 07-24-1999    dr Excell Seltzer  . OPEN VENTRAL HERNIA REPAIR /  LYSIS ADHESIONS  09-23-2010    dr Donne Hazel  . ORIF ELBOW FRACTURE Left 03/29/2015   Procedure: LEFT ELBOW OPEN REDUCTION INTERNAL FIXATION (ORIF) DISTAL HUMERUS FRACTURE WITH OLECRANON OSTEOTOMY AND ULNAR NERVE RELEASE;  Surgeon: Roseanne Kaufman, MD;  Location: [place];  Service: Orthopedics;  Laterality: Left;  . PPM GENERATOR CHANGEOUT N/A 02/10/2018   Procedure: PPM GENERATOR CHANGEOUT;  Surgeon: Deboraha Sprang, MD;  Location: [place] [facility] CV LAB;  Service: Cardiovascular;  Laterality: N/A;  . REPAIR SUPRAUMBILICAL HERNIA   [id number]   dr Excell Seltzer  . SHOULDER ARTHROSCOPY WITH DISTAL CLAVICLE RESECTION Right 11-07-2007   dr Rhona Raider   [place] ACROMIOPLASTY  . SKIN SPLIT GRAFT Left 10/22/2012   Procedure: SKIN GRAFT SPLIT THICKNESS;  Surgeon: Marianna Payment, MD;  Location: [facility];  Service: Orthopedics;  Laterality: Left;  . TOTAL KNEE ARTHROPLASTY Right 1990's  . TRANSTHORACIC ECHOCARDIOGRAM  06-12-2012   dr Irish Lack   ef 50-55%/ mild LAE/ mild MR and TR/  AV sclerosis without stenosis/  RVSP 25mmHg  . TRANSURETHRAL RESECTION OF BLADDER TUMOR N/A 07/15/2017   Procedure: CYSTOSCOPY TRANSURETHRAL RESECTION OF BLADDER TUMOR (TURBT);  Surgeon: Kathie Rhodes, MD;  Location[facility];  Service: Urology;  Laterality: N/A;     Home Medications:  Prior to Admission medications   Medication Sig Start Date End Date Taking? Authorizing Provider  acetaminophen (TYLENOL) 650 MG CR tablet Take 650 mg by mouth 2 (two) times daily.   Yes [provider]  albuterol (PROVENTIL HFA;VENTOLIN HFA) 108 (90 Base) MCG/ACT inhaler Inhale 2 puffs into the lungs every 6 (six) hours as needed for wheezing or shortness of breath. 09/19/15  Yes Rigoberto Noel, MD  apixaban (ELIQUIS) 5 MG TABS tablet Take 1 tablet (5 mg total) by mouth 2 (two) times daily. Patient taking differently: Take 2.5 mg by mouth 2 (two) times daily.  03/01/19  Yes Jettie Booze, MD  Calcium Carbonate-Vitamin D (CALCIUM-D PO) Take 1 tablet by mouth every evening.    Yes [provider]  Cholecalciferol (VITAMIN D3 PO) Take 2,000 Units by mouth daily.   Yes [provider]  ferrous sulfate 325 (65 FE) MG tablet Take 650 mg by mouth daily with breakfast.   Yes [provider]  fluticasone (FLONASE) 50 MCG/ACT nasal spray Place 1 spray into both nostrils daily as needed for  allergies or rhinitis.   Yes [provider]  furosemide (LASIX) 20 MG tablet Take 20 mg by mouth daily.    Yes [provider]  mirabegron ER (MYRBETRIQ) 50 MG TB24 tablet Take 50 mg by mouth daily.   Yes [provider]  Multiple Vitamins-Minerals (PRESERVISION AREDS 2 PO) Take 1 each  by mouth in the morning and at bedtime.   Yes [provider]  oxyCODONE (OXY IR/ROXICODONE) 5 MG immediate release tablet Take 5 mg by mouth every 6 (six) hours as needed for severe pain.   Yes [provider]  oxymetazoline (AFRIN) 0.05 % nasal spray Place 1 spray into both nostrils 2 (two) times daily as needed for congestion.   Yes [provider]  pantoprazole (PROTONIX) 40 MG tablet Take 1 tablet (40 mg total) by mouth daily before breakfast. 10/12/17 03/22/20 Yes Brahmbhatt, Parag, MD  predniSONE (DELTASONE) 10 MG tablet Take 10 mg by mouth daily with breakfast.   Yes [provider]  psyllium (METAMUCIL SMOOTH TEXTURE) 28 % packet Take 1 packet by mouth daily as needed. Mix with 8 oz liquid   Yes [provider]  umeclidinium-vilanterol (ANORO ELLIPTA) 62.5-25 MCG/INH AEPB Inhale 1 puff into the lungs daily.    Yes [provider]  vitamin B-12 (CYANOCOBALAMIN) 500 MCG tablet Take 500 mcg by mouth. Three days a week   Yes [provider]  diphenhydrAMINE (BENADRYL) 25 MG tablet Take 25 mg by mouth daily as needed for allergies.     [provider]  Folic Acid-Vit Q000111Q [id number] (FOLBEE) 2.5-25-1 MG TABS tablet Take 1 tablet by mouth daily. Patient not taking: Reported on 07/10/2019 07/04/19   Melvenia Beam, MD  predniSONE (DELTASONE) 10 MG tablet 4 tabs for 2 days, then 3 tabs for 2 days, 2 tabs for 2 days, then 1 tab for 2 days, then stop Patient not taking: Reported on 07/10/2019 06/22/19   Lauraine Rinne, NP    Inpatient Medications: Scheduled Meds: . apixaban  2.5 mg Oral BID  . Chlorhexidine Gluconate Cloth  6  each Topical Q0600  . cholecalciferol  2,000 Units Oral Daily  . doxycycline  100 mg Oral Q12H  . feeding supplement (ENSURE ENLIVE)  237 mL Oral Q24H  . ferrous sulfate  650 mg Oral Q breakfast  . furosemide  20 mg Intravenous BID  . metoprolol tartrate  25 mg Oral BID  . mirabegron ER  50 mg Oral Daily  . multivitamin with minerals  1 tablet Oral Daily  . mupirocin ointment  1 application Nasal BID  . pantoprazole  40 mg Oral QAC breakfast  . predniSONE  10 mg Oral Q breakfast  . sodium chloride flush  3 mL Intravenous Q12H  . umeclidinium-vilanterol  1 puff Inhalation Daily  . vitamin B-12  500 mcg Oral Q M,W,F   Continuous Infusions: . sodium chloride    . diltiazem (CARDIZEM) infusion Stopped (07/11/19 1741)   PRN Meds: sodium chloride, acetaminophen, albuterol, fluticasone, ondansetron (ZOFRAN) IV, oxyCODONE, psyllium, sodium chloride flush  Allergies:   No Known Allergies  Social History:   Social History   Socioeconomic History  . Marital status: Married    Spouse name: martha  . Number of children: 3  . Years of education: 56  . Highest education level: 12th grade  Occupational History  . 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 Smoking status: Former Smoker    Packs/day: 1.50    Years: 35.00    Pack years: 52.50    Types: Cigarettes    Quit date: 02/15/1973    Years since quitting: 46.4  . Smokeless tobacco: Never Used  Substance and Sexual Activity  . Alcohol use: No  . Drug use: No  . Sexual activity:  Never  Other Topics Concern  . Not on file  Social History Narrative   Lives at home w/ his wife, [name]   Right-handed   Drinks about 1 soda per day, decaf coffee   Social Determinants of Health   Financial Resource Strain:   . Difficulty of Paying Living Expenses:   Food Insecurity:   . Worried About Charity fundraiser in the Last Year:   . Arboriculturist in the Last Year:   Transportation  Needs:   . Film/video editor (Medical):   Marland Kitchen Lack of Transportation (Non-Medical):   Physical Activity:   . Days of Exercise per Week:   . Minutes of Exercise per Session:   Stress:   . Feeling of Stress :   Social Connections:   . Frequency of Communication with Friends and Family:   . Frequency of Social Gatherings with Friends and Family:   . Attends Religious Services:   . Active Member of Clubs or Organizations:   . Attends Archivist Meetings:   Marland Kitchen Marital Status:   Intimate Partner Violence:   . Fear of Current or Ex-Partner:   . Emotionally Abused:   Marland Kitchen Physically Abused:   . Sexually Abused:     Family History:    Family History  Problem Relation Age of Onset  . Heart disease Father   . Heart disease Mother   . Colon cancer Mother   . Stroke Mother   . Cancer Sister   . Heart disease Sister   . Heart attack Neg Hx   . Hypertension Neg Hx      ROS:  Please see the history of present illness.   All other ROS reviewed and negative.     Physical Exam/Data:   Vitals:   07/12/19 1200 07/12/19 1300 07/12/19 1400 07/12/19 1500  BP: (!) 131/112 92/70 107/63 103/64  Pulse: (!) 123 96 90 92  Resp: (!) 21 14 13 19   Temp: (!) 97.1 F (36.2 C)     TempSrc: Axillary     SpO2: 95% 95% 96% 96%  Weight:        Intake/Output Summary (Last 24 hours) at 07/12/2019 1553 Last data filed at 07/12/2019 1003 Gross per 24 hour  Intake 193.75 ml  Output 500 ml  Net -306.25 ml   Last 3 Weights 07/12/2019 07/11/2019 06/27/2019  Weight (lbs) 152 lb 8.9 oz 152 lb 8.9 oz 155 lb  Weight (kg) 69.2 kg 69.2 kg 70.308 kg     VS:  BP 103/64   Pulse 92   Temp (!) 97.1 F (36.2 C) (Axillary)   Resp 19   Wt 69.2 kg   SpO2 96%   BMI 21.89 kg/m  , BMI Body mass index is 21.89 kg/m. GENERAL:  Well appearing HEENT: Pupils equal round and reactive, fundi not visualized, oral mucosa unremarkable NECK:  + jugular venous distention, waveform within normal limits, carotid  upstroke brisk and symmetric, no bruits LUNGS:  Diminished breath sounds.  No crackles or wheezes.  HEART:  RRR.  PMI not displaced or sustained,S1 and S2 within normal limits, no S3, no S4, no clicks, no rubs, II/VI systolic murmurs ABD:  Flat, positive bowel sounds normal in frequency in pitch, no bruits, no rebound, no guarding, no midline pulsatile mass, no hepatomegaly, no splenomegaly EXT:  2 plus pulses throughout, R>L LE edema, no cyanosis no clubbing SKIN:  No rashes no nodules NEURO:  Cranial nerves II through XII grossly intact,  motor grossly intact throughout PSYCH:  Cognitively intact, oriented to person place and time   EKG:  The EKG was personally reviewed and demonstrates:  Atrial fibrillation.  Rate 140 bpm.  PVCs.  Right bundle branch block. Telemetry:  Telemetry was personally reviewed and demonstrates:  Atrial fibrillation.  PVCs, NSVT  Relevant CV Studies: Echo 07/11/19: 1. Severely reduced LV function, 15-20%. LVOT VTI 8 cm. Left ventricular  ejection fraction, by estimation, is 15-20%. The left ventricle has  severely decreased function. The left ventricle demonstrates global  hypokinesis. Left ventricular diastolic  function could not be evaluated.  2. Right ventricular systolic function is moderately reduced. The right  ventricular size is moderately enlarged. There is moderately elevated  pulmonary artery systolic pressure. The estimated right ventricular  systolic pressure is [id number] mmHg.  3. Left atrial size was moderately dilated.  4. Right atrial size was moderately dilated.  5. The mitral valve is degenerative. Moderate to severe mitral valve  regurgitation. No evidence of mitral stenosis.  6. The aortic valve is tricuspid and severely calcified with restricted  movement in systole. The gradients are quite low given the severely  reduced LV function (LVOT VTI 8 cm, SV=34 cc, SVi=18 cc/m2). AVA by VTI is  1.44 cm2 and DI is 0.34, consistent  with  moderate aortic stenosis. If there are concerns for severe aortic  stenosis, would recommend an aortic valve calcium score for clarification.  The aortic valve is tricuspid. Aortic valve regurgitation is not  visualized. Moderate to severe aortic valve  stenosis. Aortic valve area, by VTI measures 1.44 cm. Aortic valve mean  gradient measures 5.0 mmHg. Aortic valve Vmax measures 1.50 m/s.   Laboratory Data:  High Sensitivity Troponin:   Recent Labs  Lab 07/10/19 1620 07/10/19 1820  TROPONINIHS 68* 73*     Chemistry Recent Labs  Lab 07/10/19 1620 07/11/19 0537 07/12/19 0258  NA 142 141 142  K 4.5 3.8 4.0  CL 104 102 102  CO2 25 27 25   GLUCOSE 124* 106* 105*  BUN 45* 47* 45*  CREATININE 1.42* 1.34* 1.28*  CALCIUM 8.7* 8.7* 8.5*  GFRNONAA 42* 45* 48*  GFRAA 49* 53* 56*  ANIONGAP 13 12 15     Recent Labs  Lab 07/10/19 1620  PROT 5.7*  ALBUMIN 3.4*  AST 38  ALT 30  ALKPHOS 81  BILITOT 1.9*   Hematology Recent Labs  Lab 07/10/19 1620 07/12/19 0258  WBC 11.6* 11.5*  RBC 3.70* 3.70*  HGB 12.5* 12.7*  HCT 41.7 40.8  MCV 112.7* 110.3*  MCH 33.8 34.3*  MCHC 30.0 31.1  RDW 14.9 15.0  PLT 130* 115*   BNP Recent Labs  Lab 07/10/19 1620  BNP 1,038.0*    DDimer  Recent Labs  Lab 07/10/19 2220  DDIMER 1.60*     Radiology/Studies:  CT Head Wo Contrast  Result Date: 07/10/2019 CLINICAL DATA:  Progressive weakness fall 3 weeks ago EXAM: CT HEAD WITHOUT CONTRAST TECHNIQUE: Contiguous axial images were obtained from the base of the skull through the vertex without intravenous contrast. COMPARISON:  CT brain 12/17/2017 FINDINGS: Brain: No acute territorial infarction, hemorrhage or intracranial mass. Moderate atrophy. Mild chronic small vessel ischemic change of the white matter. Stable ventricle size. Slight asymmetric enlargement of left convexity extra-axial CSF density potentially due to chronic subdural effusion. Vascular: No hyperdense vessels.  Carotid  vascular calcification Skull: Normal. Negative for fracture or focal lesion. Sinuses/Orbits: Postsurgical changes of the right maxillary sinus. Mucosal thickening. Other: None IMPRESSION:  1. No CT evidence for acute intracranial abnormality. 2. Atrophy and chronic small vessel ischemic change of the white matter Electronically Signed   By: Donavan Foil M.D.   On: 07/10/2019 18:09   CT Cervical Spine Wo Contrast  Result Date: 07/10/2019 CLINICAL DATA:  Progressive weakness, history of fall 3 weeks ago EXAM: CT CERVICAL SPINE WITHOUT CONTRAST TECHNIQUE: Multidetector CT imaging of the cervical spine was performed without intravenous contrast. Multiplanar CT image reconstructions were also generated. COMPARISON:  CT 12/17/2017 FINDINGS: Alignment: Trace retrolisthesis C4 on C5 and anterolisthesis C7 on T1 without change. Facet alignment is maintained Skull base and vertebrae: No acute fracture. No primary bone lesion or focal pathologic process. Soft tissues and spinal canal: No prevertebral fluid or swelling. No visible canal hematoma. Disc levels: Moderate severe degenerative changes throughout the cervical spine with multiple level disc space narrowing and osteophyte. Facet degenerative change at multiple levels with multiple level bilateral foraminal stenosis. Upper chest: Bilateral pleural effusions Other: None IMPRESSION: 1. Diffuse degenerative changes without acute fracture 2. Pleural effusions at the apices Electronically Signed   By: Donavan Foil M.D.   On: 07/10/2019 18:14   DG Chest Port 1 View  Result Date: 07/10/2019 CLINICAL DATA:  Generalized weakness, bilateral lower extremity edema EXAM: PORTABLE CHEST 1 VIEW COMPARISON:  06/20/2019 FINDINGS: Single frontal view of the chest demonstrates increased veiling opacities at the lung bases consistent with progressive consolidation and enlarging bilateral pleural effusions. Increased central vascular congestion. Cardiac silhouette is stable. No  pneumothorax. Dual lead pacer unchanged. IMPRESSION: 1. Worsening volume status with progressive congestive heart failure. Electronically Signed   By: Randa Ngo M.D.   On: 07/10/2019 16:57   ECHOCARDIOGRAM COMPLETE  Result Date: 07/11/2019    ECHOCARDIOGRAM REPORT   Patient Name:   WARIS MCHAFFIE Date of Exam: 07/11/2019 Medical Rec #:  [id number]         Height:       70.0 in Accession #:    [id number]        Weight:       155.0 lb Date of Birth:  1924/12/15          BSA:          1.873 m Patient Age:    88 years          BP:           124/78 mmHg Patient Gender: M                 HR:           70 bpm. Exam Location:  Inpatient Procedure: 2D Echo Indications:    CHF 428.31  History:        Patient has prior history of Echocardiogram examinations, most                 recent 11/21/2012. Pacemaker, COPD; Risk Factors:Hypertension and                 Former Smoker. Pulmonary hypertension. sinus node dysfunction.  Sonographer:    Jannett Celestine RDCS (AE) Referring Phys: [id number] ALEXIS HUGELMEYER  Sonographer Comments: limited mobility IMPRESSIONS  1. Severely reduced LV function, 15-20%. LVOT VTI 8 cm. Left ventricular ejection fraction, by estimation, is 15-20%. The left ventricle has severely decreased function. The left ventricle demonstrates global hypokinesis. Left ventricular diastolic function could not be evaluated.  2. Right ventricular systolic function is moderately reduced. The right ventricular size is moderately enlarged. There is  moderately elevated pulmonary artery systolic pressure. The estimated right ventricular systolic pressure is [id number] mmHg.  3. Left atrial size was moderately dilated.  4. Right atrial size was moderately dilated.  5. The mitral valve is degenerative. Moderate to severe mitral valve regurgitation. No evidence of mitral stenosis.  6. The aortic valve is tricuspid and severely calcified with restricted movement in systole. The gradients are quite low given the severely reduced  LV function (LVOT VTI 8 cm, SV=34 cc, SVi=18 cc/m2). AVA by VTI is 1.44 cm2 and DI is 0.34, consistent with moderate aortic stenosis. If there are concerns for severe aortic stenosis, would recommend an aortic valve calcium score for clarification. The aortic valve is tricuspid. Aortic valve regurgitation is not visualized. Moderate to severe aortic valve  stenosis. Aortic valve area, by VTI measures 1.44 cm. Aortic valve mean gradient measures 5.0 mmHg. Aortic valve Vmax measures 1.50 m/s. FINDINGS  Left Ventricle: Severely reduced LV function, 15-20%. LVOT VTI 8 cm. Left ventricular ejection fraction, by estimation, is 15-20%. The left ventricle has severely decreased function. The left ventricle demonstrates global hypokinesis. The left ventricular internal cavity size was normal in size. There is no left ventricular hypertrophy. Left ventricular diastolic function could not be evaluated due to atrial fibrillation. Left ventricular diastolic function could not be evaluated. Right Ventricle: The right ventricular size is moderately enlarged. No increase in right ventricular wall thickness. Right ventricular systolic function is moderately reduced. There is moderately elevated pulmonary artery systolic pressure. The tricuspid  regurgitant velocity is 3.16 m/s, and with an assumed right atrial pressure of 10 mmHg, the estimated right ventricular systolic pressure is [id number] mmHg. Left Atrium: Left atrial size was moderately dilated. Right Atrium: Right atrial size was moderately dilated. Pericardium: Trivial pericardial effusion is present. Mitral Valve: The mitral valve is degenerative in appearance. Mild mitral annular calcification. Moderate to severe mitral valve regurgitation. No evidence of mitral valve stenosis. Tricuspid Valve: The tricuspid valve is grossly normal. Tricuspid valve regurgitation is mild . No evidence of tricuspid stenosis. Aortic Valve: The aortic valve is tricuspid and severely calcified with  restricted movement in systole. The gradients are quite low given the severely reduced LV function (LVOT VTI 8 cm, SV=34 cc, SVi=18 cc/m2). AVA by VTI is 1.44 cm2 and DI is 0.34, consistent with moderate aortic stenosis. If there are concerns for severe aortic stenosis, would recommend an aortic valve calcium score for clarification. The aortic valve is tricuspid. Aortic valve regurgitation is not visualized. Moderate to severe aortic stenosis is present. Aortic valve mean gradient measures 5.0 mmHg. Aortic valve peak gradient measures 9.0 mmHg. Aortic valve area, by VTI measures 1.44 cm. Pulmonic Valve: The pulmonic valve was grossly normal. Pulmonic valve regurgitation is not visualized. No evidence of pulmonic stenosis. Aorta: The aortic root is normal in size and structure. IAS/Shunts: The atrial septum is grossly normal. Additional Comments: A pacer wire is visualized in the right atrium and right ventricle. There is a small pleural effusion in the left lateral region.  LEFT VENTRICLE PLAX 2D LVIDd:         5.20 cm LVIDs:         4.10 cm LV PW:         1.10 cm LV IVS:        0.90 cm LVOT diam:     2.30 cm LV SV:         34 LV SV Index:   18 LVOT Area:     4.15 cm  LEFT ATRIUM         Index LA diam:    4.60 cm 2.46 cm/m  AORTIC VALVE AV Area (Vmax):    1.27 cm AV Area (Vmean):   1.29 cm AV Area (VTI):     1.44 cm AV Vmax:           149.87 cm/s AV Vmean:          104.497 cm/s AV VTI:            0.238 m AV Peak Grad:      9.0 mmHg AV Mean Grad:      5.0 mmHg LVOT Vmax:         45.70 cm/s LVOT Vmean:        32.500 cm/s LVOT VTI:          0.082 m LVOT/AV VTI ratio: 0.35  AORTA Ao Root diam: 3.20 cm MITRAL VALVE               TRICUSPID VALVE MV Area (PHT): 2.11 cm    TR Peak grad:   39.9 mmHg MV Decel Time: 359 msec    TR Vmax:        316.00 cm/s MR Peak grad: 84.8 mmHg MR Mean grad: 50.0 mmHg    SHUNTS MR Vmax:      460.50 cm/s  Systemic VTI:  0.08 m MR Vmean:     329.0 cm/s   Systemic Diam: 2.30 cm MV E  velocity: 56.10 cm/s Eleonore Chiquito MD Electronically signed by Eleonore Chiquito MD Signature Date/Time: 07/11/2019/4:04:21 PM    Final    VAS [place] LOWER EXTREMITY VENOUS (DVT)  Result Date: 07/12/2019  Lower Venous DVTStudy Indications: Swelling, and Edema.  Comparison Study: NO PRIOR Performing Technologist: Abram Sander RVS  Examination Guidelines: A complete evaluation includes B-mode imaging, spectral Doppler, color Doppler, and power Doppler as needed of all accessible portions of each vessel. Bilateral testing is considered an integral part of a complete examination. Limited examinations for reoccurring indications may be performed as noted. The reflux portion of the exam is performed with the patient in reverse Trendelenburg.  +---------+---------------+---------+-----------+----------+--------------+ RIGHT    CompressibilityPhasicitySpontaneityPropertiesThrombus Aging +---------+---------------+---------+-----------+----------+--------------+ CFV      Full           Yes      Yes                                 +---------+---------------+---------+-----------+----------+--------------+ SFJ      Full                                                        +---------+---------------+---------+-----------+----------+--------------+ FV Prox  Full                                                        +---------+---------------+---------+-----------+----------+--------------+ FV Mid   Full                                                        +---------+---------------+---------+-----------+----------+--------------+  FV DistalFull                                                        +---------+---------------+---------+-----------+----------+--------------+ PFV      Full                                                        +---------+---------------+---------+-----------+----------+--------------+ POP      Full           Yes      Yes                                  +---------+---------------+---------+-----------+----------+--------------+ PTV      Full                                                        +---------+---------------+---------+-----------+----------+--------------+ PERO                                                  Not visualized +---------+---------------+---------+-----------+----------+--------------+   +---------+---------------+---------+-----------+----------+--------------+ LEFT     CompressibilityPhasicitySpontaneityPropertiesThrombus Aging +---------+---------------+---------+-----------+----------+--------------+ CFV      Full           Yes      Yes                                 +---------+---------------+---------+-----------+----------+--------------+ SFJ      Full                                                        +---------+---------------+---------+-----------+----------+--------------+ FV Prox  Full                                                        +---------+---------------+---------+-----------+----------+--------------+ FV Mid   Full                                                        +---------+---------------+---------+-----------+----------+--------------+ FV DistalFull                                                        +---------+---------------+---------+-----------+----------+--------------+  PFV      Full                                                        +---------+---------------+---------+-----------+----------+--------------+ POP      Full           Yes      Yes                                 +---------+---------------+---------+-----------+----------+--------------+ PTV      Full                                                        +---------+---------------+---------+-----------+----------+--------------+ PERO                                                  Not visualized  +---------+---------------+---------+-----------+----------+--------------+     Summary: BILATERAL: - No evidence of deep vein thrombosis seen in the lower extremities, bilaterally. -   *See table(s) above for measurements and observations. Electronically signed by Servando Snare MD on 07/12/2019 at 3:37:31 PM.    Final      Assessment and Plan:   # Acute systolic and diastolic heart failure:  LVEF is newly reduced to 15 to 20%.  He is  volume overloaded on exam.  Thus far renal function is stable to improving with diuresis.  We will continue with IV Lasix.  I suspect that some of his heart failure is due to atrial fibrillation with rapid ventricular response.  We will interrogate his device to see how long he has been in atrial fibrillation.  It seems that he did not hold very long after his cardioversion.  We will add amiodarone for both rate control and to see if this helps him convert back to sinus rhythm.  He may warrant repeat attempt at cardioversion once he is loaded with amiodarone.   We will switch metoprolol to succinate.  He does not have blood pressure room for ACE inhibitor or ARB at this time.  # Persistent atrial fibrillation: Switch metoprolol to succinate, add amiodarone, and consider cardioversion as above.  # Elevated troponin:  Likely demand ischemia.  He has no chest pain and has no ischemic changes on EKG.  Treat heart failure and atrial fibrillation as above.  # Moderate aortic stenosis:  # Moderate mitral regurgitation:  Echo was personally reviewed.  Although his aortic valve is heavily calcified and there is minimal excursion of the left and right coronary cusp, the noncoronary cusp seems to move pretty well.  Mean gradient was only 5 mmHg.  Dimensionless index was 0.34.  I agree with Dr. Audie Box that this is likely moderate aortic stenosis.  Replacing his aortic valve is unlikely to change the overall course.  His mitral regurgitation seems to be moderate and also unlikely to  explain his overall symptomatology.  Diuresis as above.      For questions or updates, please contact [place] Please  consult [email or web address] for contact info under     Signed, Skeet Latch, MD  07/12/2019 3:53 PM

## 2019-07-12 NOTE — Progress Notes (Signed)
  Amiodarone Drug - Drug Interaction Consult Note  Recommendations: - Pt already on reduced dose apixaban, interaction with amiodarone likely minimal  - On Toprol XL but pt being monitored closely at current time - On Lasix 20 mg IV q12 - K+ 4.0 today   Amiodarone is metabolized by the cytochrome P450 system and therefore has the potential to cause many drug interactions. Amiodarone has an average plasma half-life of 50 days (range 20 to 100 days).   There is potential for drug interactions to occur several weeks or months after stopping treatment and the onset of drug interactions may be slow after initiating amiodarone.   []  Statins: Increased risk of myopathy. Simvastatin- restrict dose to 20mg  daily. Other statins: counsel patients to report any muscle pain or weakness immediately.  []  Anticoagulants: Amiodarone can increase anticoagulant effect. Consider warfarin dose reduction. Patients should be monitored closely and the dose of anticoagulant altered accordingly, remembering that amiodarone levels take several weeks to stabilize.  []  Antiepileptics: Amiodarone can increase plasma concentration of phenytoin, the dose should be reduced. Note that small changes in phenytoin dose can result in large changes in levels. Monitor patient and counsel on signs of toxicity.  [x]  Beta blockers: increased risk of bradycardia, AV block and myocardial depression. Sotalol - avoid concomitant use.  []   Calcium channel blockers (diltiazem and verapamil): increased risk of bradycardia, AV block and myocardial depression.  []   Cyclosporine: Amiodarone increases levels of cyclosporine. Reduced dose of cyclosporine is recommended.  []  Digoxin dose should be halved when amiodarone is started.  [x]  Diuretics: increased risk of cardiotoxicity if hypokalemia occurs.  []  Oral hypoglycemic agents (glyburide, glipizide, glimepiride): increased risk of hypoglycemia. Patient's glucose levels should be monitored  closely when initiating amiodarone therapy.   []  Drugs that prolong the QT interval:  Torsades de pointes risk may be increased with concurrent use - avoid if possible.  Monitor QTc, also keep magnesium/potassium WNL if concurrent therapy can't be avoided. Marland Kitchen Antibiotics: e.g. fluoroquinolones, erythromycin. . Antiarrhythmics: e.g. quinidine, procainamide, disopyramide, sotalol. . Antipsychotics: e.g. phenothiazines, haloperidol.  . Lithium, tricyclic antidepressants, and methadone. Thank You,    Royetta Asal, PharmD, BCPS 07/12/2019 5:19 PM

## 2019-07-12 NOTE — Progress Notes (Signed)
Lower extremity venous has been completed.   Preliminary results in CV Proc.   [name] [name] 07/12/2019 8:50 AM

## 2019-07-12 NOTE — Progress Notes (Signed)
Initial Nutrition Assessment  RD working remotely.   DOCUMENTATION CODES:   Not applicable  INTERVENTION:  - will order Ensure Enlive once/day, each supplement provides 350 kcal and 20 grams of protein. - will order 1 tablet multivitamin with minerals/day.   NUTRITION DIAGNOSIS:   Increased nutrient needs related to acute illness as evidenced by estimated needs.  GOAL:   Patient will meet greater than or equal to 90% of their needs  MONITOR:   PO intake, Supplement acceptance, Labs, Weight trends  REASON FOR ASSESSMENT:   Malnutrition Screening Tool    ASSESSMENT:   86 y.o. male with a known history of Afib, stage 3 CKD, and COPD. He presented to the ED for evaluation of weakness, SOB, and BLE edema. Symptoms began 10 days PTA. He lives at home with his wife and ambulates with a walker.  No intakes documented since admission. Patient has not been seen by a [facility] RD at any point in the past. Weight today is 152 lb, weight on 5/12 was 155 lb, and weight on 3/24 was 149 lb. Flow sheet documentation indicates mild edema to BLE.   Per notes: - afib with RVR - new onset acute heart failure - AKI on stage 3 CKD - weakness/debility   Labs reviewed; BUN: 45 mg/dl, creatinine: 1.28 mg/dl, Ca: 8.5 mg/dl, GFR: 48 mg/dl. Medications reviewed; 2000 units cholecalciferol/day, 650 mg ferrous sulfate/day, 20 mg IV lasix BID, 10 mg deltasone/day, 500 mcg oral cyanocobalamin on MWF.     NUTRITION - FOCUSED PHYSICAL EXAM:  unable to complete at this time.   Diet Order:   Diet Order            Diet Heart Room service appropriate? Yes; Fluid consistency: Thin  Diet effective now              EDUCATION NEEDS:   Not appropriate for education at this time  Skin:  Skin Assessment: Skin Integrity Issues: Skin Integrity Issues:: DTI, Other (Comment) DTI: toe (unknown which one) Other: non-pressure area to L leg  Last BM:  5/25  Height:   Ht Readings from Last 1  Encounters:  06/27/19 5\' 10"  (1.778 m)    Weight:   Wt Readings from Last 1 Encounters:  07/12/19 69.2 kg    Estimated Nutritional Needs:  Kcal:  1450-1650 kcal Protein:  65-75 grams Fluid:  >/= 1.7 L/day     Jarome Matin, MS, RD, LDN, CNSC Inpatient Clinical Dietitian RD pager # available in AMION  After hours/weekend pager # available in [facility]

## 2019-07-12 NOTE — Evaluation (Signed)
Occupational Therapy Evaluation Patient Details Name: [name] [name] MRN: [id number] DOB: 22-Jul-1924 Today's Date: 07/12/2019    History of Present Illness [name] is a 86 y.o. male with a known history of Afib with PPM, CKD3, COPD,  presents to the emergency department 07/11/19  for evaluation of weakness, SOB, LE edema, falls.   Clinical Impression   Patient with functional deficits listed below impacting safety/independence with self care. Patient HR up to 137 with bed level activity therefore limited to rolling in bed with mod A x2 for use of bed pan. Patient require cues for use of bed rails and for body mechanics to assist with rolling. Due to needing assist x2 for bed level activity currently recommending SNF. If patient's family can provide 24/7 assistance at current level would recommend home health/home health aides to assist spouse as patient states she has been helping him at home.    Follow Up Recommendations  SNF;Supervision/Assistance - 24 hour;Home health OT;Other (comment)(HH if family can provide 24/7 A )    Equipment Recommendations  3 in 1 bedside commode       Precautions / Restrictions Precautions Precautions: Fall Precaution Comments: has had several Restrictions Weight Bearing Restrictions: No      Mobility Bed Mobility Overal bed mobility: Needs Assistance Bed Mobility: Rolling Rolling: Mod assist;+2 for safety/equipment         General bed mobility comments: requires increased time to initiate, cues to use bed rails and bend knees to assist with rolling  Transfers                 General transfer comment: NT- HR up into 137, pt not feeling up to it.        ADL either performed or assessed with clinical judgement   ADL Overall ADL's : Needs assistance/impaired Eating/Feeding: Set up;Bed level   Grooming: Set up;Bed level   Upper Body Bathing: Minimal assistance;Bed level   Lower Body Bathing: Maximal assistance;Total  assistance;Bed level   Upper Body Dressing : Minimal assistance;Bed level   Lower Body Dressing: Maximal assistance;Total assistance;Bed level   Toilet Transfer: +2 for physical assistance;+2 for safety/equipment Toilet Transfer Details (indicate cue type and reason): mod A x1-2 for rolling in bed for bed pan, pt tachy with bed level mobility therefore deferred further mobility. anticipate assist x2 Toileting- Clothing Manipulation and Hygiene: Total assistance;Bed level                            Pertinent Vitals/Pain Pain Assessment: Faces Faces Pain Scale: Hurts little more Pain Location: left shoulder Pain Descriptors / Indicators: Crushing Pain Intervention(s): Monitored during session     Hand Dominance Right   Extremity/Trunk Assessment Upper Extremity Assessment Upper Extremity Assessment: Generalized weakness   Lower Extremity Assessment Lower Extremity Assessment: Defer to PT evaluation   Cervical / Trunk Assessment Cervical / Trunk Assessment: Kyphotic   Communication Communication Communication: HOH   Cognition Arousal/Alertness: Awake/alert Behavior During Therapy: WFL for tasks assessed/performed Overall Cognitive Status: No family/caregiver present to determine baseline cognitive functioning                                 General Comments: patient knows today is thursday, when asked the month states "I don't know" joking around a lot   General Comments  NT            Home  Living Family/patient expects to be discharged to:: Private residence Living Arrangements: Spouse/significant other Available Help at Discharge: Family;Available 24 hours/day Type of Home: House Home Access: Ramped entrance     Home Layout: One level     Bathroom Shower/Tub: Occupational psychologist: Handicapped height     Home Equipment: Environmental consultant - 4 wheels;Cane - single point   Additional Comments: lift chair,      Prior  Functioning/Environment Level of Independence: Needs assistance  Gait / Transfers Assistance Needed: pt reports walks with cane or rolling walker ADL's / Homemaking Assistance Needed: wife assists with sponge bath and dressing            OT Problem List: Decreased strength;Decreased activity tolerance;Impaired balance (sitting and/or standing);Decreased safety awareness;Decreased knowledge of use of DME or AE;Pain      OT Treatment/Interventions: Self-care/ADL training;Therapeutic exercise;Energy conservation;DME and/or AE instruction;Therapeutic activities;Patient/family education;Balance training    OT Goals(Current goals can be found in the care plan section) Acute Rehab OT Goals Patient Stated Goal: "I need the bed pan" OT Goal Formulation: With patient Time For Goal Achievement: 07/26/19 Potential to Achieve Goals: Good  OT Frequency: Min 2X/week           Co-evaluation PT/OT/SLP Co-Evaluation/Treatment: Yes Reason for Co-Treatment: Complexity of the patient's impairments (multi-system involvement);For patient/therapist safety;To address functional/ADL transfers PT goals addressed during session: Mobility/safety with mobility OT goals addressed during session: ADL's and self-care      AM-PAC OT "6 Clicks" Daily Activity     Outcome Measure Help from another person eating meals?: A Little Help from another person taking care of personal grooming?: A Little Help from another person toileting, which includes using toliet, bedpan, or urinal?: Total Help from another person bathing (including washing, rinsing, drying)?: A Lot Help from another person to put on and taking off regular upper body clothing?: A Little Help from another person to put on and taking off regular lower body clothing?: Total 6 Click Score: 13   End of Session    Activity Tolerance: Treatment limited secondary to medical complications (Comment);Other (comment)(HR up to 137 with bed level  activity) Patient left: in bed;with call bell/phone within reach;with bed alarm set  OT Visit Diagnosis: Other abnormalities of gait and mobility (R26.89);Muscle weakness (generalized) (M62.81);History of falling (Z91.81);Pain Pain - Right/Left: Left Pain - part of body: Shoulder                Time: 0939-1000 OT Time Calculation (min): 21 min Charges:  OT General Charges $OT Visit: 1 Visit OT Evaluation $OT Eval Moderate Complexity: [place] OT Pager: [place] 07/12/2019, 2:15 PM

## 2019-07-12 NOTE — Evaluation (Signed)
Physical Therapy Evaluation Patient Details Name: [name] [name] MRN: [id number] DOB: November 19, 1924 Today's Date: 07/12/2019   History of Present Illness  [name] [name] is a 86 y.o. male with a known history of Afib with PPM, CKD3, COPD,  presents to the emergency department 07/11/19  for evaluation of weakness, SOB, LE edema, falls.  Clinical Impression  The patient was focused on having a BM. Assisted off and on Bed pan. Patient declined stating that he did not feel up to sitting on  bed edge. HR noted to vary from 110- 137. RN aware. Patient reports home with wife assisting. Pt admitted with above diagnosis.  Pt currently with functional limitations due to the deficits listed below (see PT Problem List). Pt will benefit from skilled PT to increase their independence and safety with mobility to allow discharge to the venue listed below.       Follow Up Recommendations SNF;Supervision/Assistance - 24 hour(vs HH if progresses to Dc home)    Equipment Recommendations  None recommended by PT    Recommendations for Other Services       Precautions / Restrictions Precautions Precautions: Fall Precaution Comments: has had several Restrictions Weight Bearing Restrictions: No      Mobility  Bed Mobility Overal bed mobility: Needs Assistance Bed Mobility: Rolling Rolling: Mod assist;+2 for safety/equipment         General bed mobility comments: requires increased time to initiate, cues to use bed rails and bend knees to assist with rolling  Transfers                 General transfer comment: NT- HR up into 137, pt not feeling up to it.  Ambulation/Gait                Stairs            Wheelchair Mobility    Modified Rankin (Stroke Patients Only)       Balance                                             Pertinent Vitals/Pain Pain Assessment: Faces Faces Pain Scale: Hurts little more Pain Location: left shoulder Pain  Descriptors / Indicators: Crushing Pain Intervention(s): Monitored during session    Home Living Family/patient expects to be discharged to:: Private residence Living Arrangements: Spouse/significant other Available Help at Discharge: Family;Available 24 hours/day Type of Home: House Home Access: Ramped entrance     Home Layout: One level Home Equipment: Walker - 4 wheels;Cane - single point Additional Comments: lift chair,    Prior Function Level of Independence: Needs assistance   Gait / Transfers Assistance Needed: pt reports walks with cane or rolling walker  ADL's / Homemaking Assistance Needed: wife assists with sponge bath and dressing        Hand Dominance   Dominant Hand: Right    Extremity/Trunk Assessment   Upper Extremity Assessment Upper Extremity Assessment: Generalized weakness    Lower Extremity Assessment Lower Extremity Assessment: Generalized weakness    Cervical / Trunk Assessment Cervical / Trunk Assessment: Kyphotic  Communication   Communication: HOH  Cognition Arousal/Alertness: Awake/alert Behavior During Therapy: WFL for tasks assessed/performed Overall Cognitive Status: No family/caregiver present to determine baseline cognitive functioning  General Comments: patient knows today is thursday, when asked the month states "I don't know" joking around a lot      General Comments General comments (skin integrity, edema, etc.): NT    Exercises     Assessment/Plan    PT Assessment Patient needs continued PT services  PT Problem List Decreased strength;Decreased balance;Decreased cognition;Decreased knowledge of precautions;Decreased mobility;Decreased knowledge of use of DME;Cardiopulmonary status limiting activity;Decreased activity tolerance       PT Treatment Interventions DME instruction;Therapeutic activities;Gait training;Therapeutic exercise;Patient/family education;Functional  mobility training    PT Goals (Current goals can be found in the Care Plan section)  Acute Rehab PT Goals Patient Stated Goal: "I need the bed pan" PT Goal Formulation: With patient Time For Goal Achievement: 07/26/19 Potential to Achieve Goals: Fair    Frequency Min 2X/week   Barriers to discharge Decreased caregiver support      Co-evaluation PT/OT/SLP Co-Evaluation/Treatment: Yes Reason for Co-Treatment: Necessary to address cognition/behavior during functional activity;For patient/therapist safety;To address functional/ADL transfers PT goals addressed during session: Mobility/safety with mobility OT goals addressed during session: ADL's and self-care       AM-PAC PT "6 Clicks" Mobility  Outcome Measure Help needed turning from your back to your side while in a flat bed without using bedrails?: A Lot Help needed moving from lying on your back to sitting on the side of a flat bed without using bedrails?: A Lot Help needed moving to and from a bed to a chair (including a wheelchair)?: Total Help needed standing up from a chair using your arms (e.g., wheelchair or bedside chair)?: Total Help needed to walk in hospital room?: Total Help needed climbing 3-5 steps with a railing? : Total 6 Click Score: 8    End of Session   Activity Tolerance: Patient limited by fatigue;Treatment limited secondary to medical complications (Comment) Patient left: in bed;with call bell/phone within reach;with bed alarm set Nurse Communication: Mobility status PT Visit Diagnosis: Unsteadiness on feet (R26.81);Difficulty in walking, not elsewhere classified (R26.2);Pain;Repeated falls (R29.6) Pain - Right/Left: Left Pain - part of body: Shoulder    Time: 0935-1000 PT Time Calculation (min) (ACUTE ONLY): 25 min   Charges:   PT Evaluation $PT Eval Moderate Complexity: [place] Pager [phone] Office [phone]   Claretha Cooper 07/12/2019, 3:36 PM

## 2019-07-12 NOTE — Consult Note (Signed)
Consultation Note Date: 07/12/2019   Patient Name: [name] [name]  DOB: 1924/09/18  MRN: [id number]  Age / Sex: 86 y.o., male  PCP: Lawerance Cruel, MD Referring Physician: Alma Friendly, MD  Reason for Consultation: Establishing goals of care  HPI/Patient Profile: 86 y.o. male  admitted on 07/10/2019   Clinical Assessment and Goals of Care: 86 year old gentleman who lives at home with his wife.  Patient has a past medical history significant for COPD and sees pulmonology in the outpatient setting, noted to have COPD exacerbation in February.  Has obstructive sleep apnea for which he uses CPAP.  Has history of atrial fibrillation for which he is maintained on oral anticoagulation with Eliquis.  Also has history of permanent pacemaker placement as well as history of chronic bilateral edema and CKD possibly stage III.  Patient has been admitted to the hospital for weakness shortness of breath and lower extremity edema.  He remains admitted in the stepdown unit at [facility] in [place], [place] for paroxysmal atrial fibrillation with rapid ventricular response, new onset acute systolic congestive heart failure, acute kidney injury with underlying at least stage III chronic kidney disease and elevated troponins.  He is also undergoing evaluation for bilateral lower extremity edema/weeping.  Palliative care consultation has been requested for goals of care discussions.  Patient is resting in bed.  He is reasonably awake and alert.  He responds and interacts appropriately.  He denies chest pain or shortness of breath.  He simply states that he was getting weaker at home and needed to come in and get checked out.  Brief life review performed.  Patient states that he enjoys art collecting and has participated in trade shows and has traveled extensively because of that.  He wishes to go home  but understands that he might have to attempt short-term rehabilitation towards the end of this hospitalization.  With his permission, call placed and discussed with patient's son [name] [name] at [id number].  Introduced myself and palliative care as follows:  Palliative medicine is specialized medical care for people living with serious illness. It focuses on providing relief from the symptoms and stress of a serious illness. The goal is to improve quality of life for both the patient and the family.  Goals of care: Broad aims of medical therapy in relation to the patient's values and preferences. Our aim is to provide medical care aimed at enabling patients to achieve the goals that matter most to them, given the circumstances of their particular medical situation and their constraints.   Attempted to explore about patient's baseline status.  Patient has neuropathy in both legs.  He has had gradual progressive decline over the course of the past 6 to 8 months.  Particularly exacerbated since the past couple of months when the patient has not been able to use his walker or lift chair.  He has had extreme difficulty moving around and even had a fall.  Over the course of the past couple of weeks his appetite decreased  greatly.  At home, patient has not complained of chest pain.  I discussed with the patient's son about scope of current hospitalization.  He is also to be evaluated by cardiology.  Patient was given Cardizem for atrial fibrillation with rapid ventricular response.  Discussed with the patient's son who has reviewed records about his echocardiogram.  Previous echo was done in 2014 that showed an ejection fraction of 50-55% and mild mitral regurgitation.  As compared to the echocardiogram done in this hospitalization on 07-11-19, ejection fraction is noted to be 15-20% with marked decrease in left ventricular function, moderate to severe mitral regurgitation and moderate aortic stenosis.  We discussed  about signs and symptoms pertaining to worsening heart failure.  In light of patient's current conditions, goals wishes and values important to the patient and family as a unit attempted to be explored.  Patient's son asks if it is appropriate for the patient to be transferred over to [place].  We discussed about his current medication list and current attempts at achieving stabilization.  We discussed about continuing current mode of care as a time-limited trial of current interventions.  Gave some information about concept of comfort measures, hospice philosophy of care if in spite of best measures and recommendations the patient is not progressing towards stabilization or recovery.  Palliative medicine team to follow.  Thank you for the consult.  HCPOA  lives at home with wife.  Has 2 sons, son [name] [name] [id number]. Also has another son who is an MD, coming into town from [place].    SUMMARY OF RECOMMENDATIONS    Agree with DNR Continue current mode of care, monitor hospital course and overall disease trajectory of illness Recommend SNF rehab with palliative to follow, if patient is able to be meaningfully stabilized towards the end of this hospitalization.  If patient has ongoing decline or symptom burden, in spite of current measures, then would recommend consideration for comfort measures and hospice eligibility at that time.  PMT will peripherally follow in this hospitalization.   Code Status/Advance Care Planning:  DNR    Symptom Management:    as above   Palliative Prophylaxis:   Delirium Protocol    Psycho-social/Spiritual:   Desire for further Chaplaincy support:yes  Additional Recommendations: Caregiving  Support/Resources  Prognosis:   Unable to determine  Discharge Planning: To Be Determined      Primary Diagnoses: Present on Admission: **None**   I have reviewed the medical record, interviewed the patient and family, and examined the  patient. The following aspects are pertinent.  Past Medical History:  Diagnosis Date  . Abdominal hernia   . Abnormality of gait    due to low back pain , uses  cane and walker  . Anemia of chronic disease    Dr. Alen Blew  . Arthritis   . Bilateral lower extremity edema    wears TED hose  . Cancer [facility])    prostate- treated with radiation  . Cardiac pacemaker in situ 10/11/2006   medtronic  . Chronic iron deficiency anemia oncologist/ hematoloist-- dr Alen Blew   treatment IV Iron infusions  . Chronic low back pain   . CKD (chronic kidney disease), stage III   . COPD with emphysema [facility])    pulmologist-  dr Elsworth Soho  . DDD (degenerative disc disease), lumbosacral   . Degenerative scoliosis   . Dyspnea    with exertion  . Full dentures   . GERD (gastroesophageal reflux disease)   . Johney Maine  hematuria   . Headache    history of migraines- "way in the past"  . Hereditary and idiopathic peripheral neuropathy    bilateral lower leg  . Hiatal hernia   . History of DVT of lower extremity 10/19/2011   chronic distal popliteal right lower extremity  . History of external beam radiation therapy 2008   prostate cancer  . History of pancreatitis 2013  . History of pneumothorax 09/2006   iatrogenic-- in setting cardiac pacemaker placement  . History of prostate cancer 2008   s/p  external radiation therapy  . Hypertension   . Injury of left ulnar nerve 06/2015   Dr. Jannifer Franklin   . Left carpal tunnel syndrome   . Lesion of bladder   . Macular degeneration of both eyes   . Macular degeneration of right eye   . Mild mitral regurgitation   . Mild mitral regurgitation    and trace AI 07/22/08 echo and 10/2010  . Mild pulmonary hypertension ([place])   . Nocturia   . OSA on CPAP   . PAF (paroxysmal atrial fibrillation) ([place]) 10/2011   1st episode documented 09/ 2013 admission for gallstons/ pancreatitis and prior to cholecystectomy , went back in NSR without interventeion  . Pancreatitis 10/2011    gallstone induced  . Presence of permanent cardiac pacemaker   . Prostate cancer ([place])    Dr. Karsten Ro  . Sinus node dysfunction ([facility])    s/p  cardiac pacemaker placement 10-11-2006  . Sinus node dysfunction ([facility])    pacemaker  . Spinal stenosis    L2  . Venous insufficiency   . Wears glasses   . Wears hearing aid in both ears    Social History   Socioeconomic History  . Marital status: Married    Spouse name: martha  . Number of children: 3  . Years of education: 86  . Highest education level: 12th grade  Occupational History  . 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 Smoking status: Former Smoker    Packs/day: 1.50    Years: 35.00    Pack years: 52.50    Types: Cigarettes    Quit date: 02/15/1973    Years since quitting: 46.4  . Smokeless tobacco: Never Used  Substance and Sexual Activity  . Alcohol use: No  . Drug use: No  . Sexual activity: Never  Other Topics Concern  . Not on file  Social History Narrative   Lives at home w/ his wife, [name]   Right-handed   Drinks about 1 soda per day, decaf coffee   Social Determinants of Health   Financial Resource Strain:   . Difficulty of Paying Living Expenses:   Food Insecurity:   . Worried About Charity fundraiser in the Last Year:   . Arboriculturist in the Last Year:   Transportation Needs:   . Film/video editor (Medical):   Marland Kitchen Lack of Transportation (Non-Medical):   Physical Activity:   . Days of Exercise per Week:   . Minutes of Exercise per Session:   Stress:   . Feeling of Stress :   Social Connections:   . Frequency of Communication with Friends and Family:   . Frequency of Social Gatherings with Friends and Family:   . Attends Religious Services:   . Active Member of Clubs or Organizations:   . Attends Archivist Meetings:   Marland Kitchen Marital Status:  Family History  Problem Relation Age of Onset  . Heart disease Father   . Heart  disease Mother   . Colon cancer Mother   . Stroke Mother   . Cancer Sister   . Heart disease Sister   . Heart attack Neg Hx   . Hypertension Neg Hx    Scheduled Meds: . apixaban  2.5 mg Oral BID  . Chlorhexidine Gluconate Cloth  6 each Topical Q0600  . cholecalciferol  2,000 Units Oral Daily  . doxycycline  100 mg Oral Q12H  . feeding supplement (ENSURE ENLIVE)  237 mL Oral Q24H  . ferrous sulfate  650 mg Oral Q breakfast  . furosemide  20 mg Intravenous BID  . mouth rinse  15 mL Mouth Rinse BID  . metoprolol tartrate  25 mg Oral BID  . mirabegron ER  50 mg Oral Daily  . multivitamin with minerals  1 tablet Oral Daily  . mupirocin ointment  1 application Nasal BID  . pantoprazole  40 mg Oral QAC breakfast  . predniSONE  10 mg Oral Q breakfast  . sodium chloride flush  3 mL Intravenous Q12H  . umeclidinium-vilanterol  1 puff Inhalation Daily  . vitamin B-12  500 mcg Oral Q M,W,F   Continuous Infusions: . sodium chloride    . diltiazem (CARDIZEM) infusion Stopped (07/11/19 1741)   PRN Meds:.sodium chloride, acetaminophen, albuterol, fluticasone, ondansetron (ZOFRAN) IV, oxyCODONE, psyllium, sodium chloride flush Medications Prior to Admission:  Prior to Admission medications   Medication Sig Start Date End Date Taking? Authorizing Provider  acetaminophen (TYLENOL) 650 MG CR tablet Take 650 mg by mouth 2 (two) times daily.   Yes [provider]  albuterol (PROVENTIL HFA;VENTOLIN HFA) 108 (90 Base) MCG/ACT inhaler Inhale 2 puffs into the lungs every 6 (six) hours as needed for wheezing or shortness of breath. 09/19/15  Yes Rigoberto Noel, MD  apixaban (ELIQUIS) 5 MG TABS tablet Take 1 tablet (5 mg total) by mouth 2 (two) times daily. Patient taking differently: Take 2.5 mg by mouth 2 (two) times daily.  03/01/19  Yes Jettie Booze, MD  Calcium Carbonate-Vitamin D (CALCIUM-D PO) Take 1 tablet by mouth every evening.    Yes [provider]  Cholecalciferol  (VITAMIN D3 PO) Take 2,000 Units by mouth daily.   Yes [provider]  ferrous sulfate 325 (65 FE) MG tablet Take 650 mg by mouth daily with breakfast.   Yes [provider]  fluticasone (FLONASE) 50 MCG/ACT nasal spray Place 1 spray into both nostrils daily as needed for allergies or rhinitis.   Yes [provider]  furosemide (LASIX) 20 MG tablet Take 20 mg by mouth daily.    Yes [provider]  mirabegron ER (MYRBETRIQ) 50 MG TB24 tablet Take 50 mg by mouth daily.   Yes [provider]  Multiple Vitamins-Minerals (PRESERVISION AREDS 2 PO) Take 1 each by mouth in the morning and at bedtime.   Yes [provider]  oxyCODONE (OXY IR/ROXICODONE) 5 MG immediate release tablet Take 5 mg by mouth every 6 (six) hours as needed for severe pain.   Yes [provider]  oxymetazoline (AFRIN) 0.05 % nasal spray Place 1 spray into both nostrils 2 (two) times daily as needed for congestion.   Yes [provider]  pantoprazole (PROTONIX) 40 MG tablet Take 1 tablet (40 mg total) by mouth daily before breakfast. 10/12/17 03/22/20 Yes Brahmbhatt, Parag, MD  predniSONE (DELTASONE) 10 MG tablet Take 10  mg by mouth daily with breakfast.   Yes [provider]  psyllium (METAMUCIL SMOOTH TEXTURE) 28 % packet Take 1 packet by mouth daily as needed. Mix with 8 oz liquid   Yes [provider]  umeclidinium-vilanterol (ANORO ELLIPTA) 62.5-25 MCG/INH AEPB Inhale 1 puff into the lungs daily.    Yes [provider]  vitamin B-12 (CYANOCOBALAMIN) 500 MCG tablet Take 500 mcg by mouth. Three days a week   Yes [provider]  diphenhydrAMINE (BENADRYL) 25 MG tablet Take 25 mg by mouth daily as needed for allergies.     [provider]  Folic Acid-Vit [id number] [id number] (FOLBEE) 2.5-25-1 MG TABS tablet Take 1 tablet by mouth daily. Patient not taking: Reported on 07/10/2019 07/04/19   Melvenia Beam, MD  predniSONE  (DELTASONE) 10 MG tablet 4 tabs for 2 days, then 3 tabs for 2 days, 2 tabs for 2 days, then 1 tab for 2 days, then stop Patient not taking: Reported on 07/10/2019 06/22/19   Lauraine Rinne, NP   No Known Allergies Review of Systems Denies pain, denies shortness of breath.   Physical Exam Pleasant elderly gentleman No distress Has some bruises/purpuric spots chest wall and both UE, patient is on chronic anti coagulation.  Regular work of breathing Irregular Tele monitor reflects A fib with heart rate in the one hundred and teens.  Has edema and some weeping both LE Awake alert, verbalizes appropriately and follows commands.    Vital Signs: BP (!) 123/91   Pulse (!) 120   Temp (!) 97.1 F (36.2 C) (Axillary)   Resp (!) 23   Wt 69.2 kg   SpO2 93%   BMI 21.89 kg/m  Pain Scale: 0-10   Pain Score: Asleep   SpO2: SpO2: 93 % O2 Device:SpO2: 93 % O2 Flow Rate: .O2 Flow Rate (L/min): 3 L/min  IO: Intake/output summary:   Intake/Output Summary (Last 24 hours) at 07/12/2019 1459 Last data filed at 07/12/2019 1003 Gross per 24 hour  Intake 193.75 ml  Output 500 ml  Net -306.25 ml    LBM: Last BM Date: 07/10/19 Baseline Weight: Weight: 69.2 kg Most recent weight: Weight: 69.2 kg     Palliative Assessment/Data:   PPS 40%  Time In:  11 Time Out:  12.10 Time Total:  70 Greater than 50%  of this time was spent counseling and coordinating care related to the above assessment and plan.  Signed by: Loistine Chance, MD   Please contact Palliative Medicine Team phone at [phone] for questions and concerns.  For individual provider: See Shea Evans

## 2019-07-13 ENCOUNTER — Other Ambulatory Visit: Payer: Medicare Other

## 2019-07-13 DIAGNOSIS — Z7189 Other specified counseling: Secondary | ICD-10-CM

## 2019-07-13 DIAGNOSIS — Z515 Encounter for palliative care: Secondary | ICD-10-CM

## 2019-07-13 DIAGNOSIS — I4891 Unspecified atrial fibrillation: Secondary | ICD-10-CM

## 2019-07-13 DIAGNOSIS — R531 Weakness: Secondary | ICD-10-CM

## 2019-07-13 LAB — CBC WITH DIFFERENTIAL/PLATELET
Abs Immature Granulocytes: 0.05 10*3/uL (ref 0.00–0.07)
Basophils Absolute: 0 10*3/uL (ref 0.0–0.1)
Basophils Relative: 0 %
Eosinophils Absolute: 0 10*3/uL (ref 0.0–0.5)
Eosinophils Relative: 0 %
HCT: 40.1 % (ref 39.0–52.0)
Hemoglobin: 12.2 g/dL — ABNORMAL LOW (ref 13.0–17.0)
Immature Granulocytes: 1 %
Lymphocytes Relative: 5 %
Lymphs Abs: 0.5 10*3/uL — ABNORMAL LOW (ref 0.7–4.0)
MCH: 34.1 pg — ABNORMAL HIGH (ref 26.0–34.0)
MCHC: 30.4 g/dL (ref 30.0–36.0)
MCV: 112 fL — ABNORMAL HIGH (ref 80.0–100.0)
Monocytes Absolute: 0.5 10*3/uL (ref 0.1–1.0)
Monocytes Relative: 5 %
Neutro Abs: 9.3 10*3/uL — ABNORMAL HIGH (ref 1.7–7.7)
Neutrophils Relative %: 89 %
Platelets: 126 10*3/uL — ABNORMAL LOW (ref 150–400)
RBC: 3.58 MIL/uL — ABNORMAL LOW (ref 4.22–5.81)
RDW: 14.8 % (ref 11.5–15.5)
WBC: 10.4 10*3/uL (ref 4.0–10.5)
nRBC: 0 % (ref 0.0–0.2)

## 2019-07-13 LAB — BASIC METABOLIC PANEL
Anion gap: 10 (ref 5–15)
BUN: 48 mg/dL — ABNORMAL HIGH (ref 8–23)
CO2: 24 mmol/L (ref 22–32)
Calcium: 8.3 mg/dL — ABNORMAL LOW (ref 8.9–10.3)
Chloride: 103 mmol/L (ref 98–111)
Creatinine, Ser: 1.35 mg/dL — ABNORMAL HIGH (ref 0.61–1.24)
GFR calc Af Amer: 52 mL/min — ABNORMAL LOW (ref >60)
GFR calc non Af Amer: 45 mL/min — ABNORMAL LOW (ref >60)
Glucose, Bld: 109 mg/dL — ABNORMAL HIGH (ref 70–99)
Potassium: 4 mmol/L (ref 3.5–5.1)
Sodium: 137 mmol/L (ref 135–145)

## 2019-07-13 MED ORDER — MIRTAZAPINE 15 MG PO TBDP
7.5000 mg | ORAL_TABLET | Freq: Every day | ORAL | Status: DC
  Administered 2019-07-13 – 2019-07-25 (×12): 7.5 mg via ORAL
  Filled 2019-07-13 (×13): qty 0.5

## 2019-07-13 MED ORDER — AMIODARONE HCL 200 MG PO TABS
400.0000 mg | ORAL_TABLET | Freq: Two times a day (BID) | ORAL | Status: DC
  Administered 2019-07-13 – 2019-07-16 (×7): 400 mg via ORAL
  Filled 2019-07-13 (×7): qty 2

## 2019-07-13 MED ORDER — AMIODARONE HCL 200 MG PO TABS: 200.0000 mg | ORAL_TABLET | Freq: Every day | ORAL | Status: DC

## 2019-07-13 MED ORDER — FUROSEMIDE 10 MG/ML IJ SOLN
40.0000 mg | Freq: Two times a day (BID) | INTRAMUSCULAR | Status: DC
  Administered 2019-07-13 – 2019-07-14 (×3): 40 mg via INTRAVENOUS
  Filled 2019-07-13 (×3): qty 4

## 2019-07-13 NOTE — Progress Notes (Signed)
Daily Progress Note   Patient Name: [name] [name]       Date: 07/13/2019 DOB: 04-Dec-1924  Age: 86 y.o. MRN#: [id number] Attending Physician: Alma Friendly, MD Primary Care Physician: Lawerance Cruel, MD Admit Date: 07/10/2019  Reason for Consultation/Follow-up: Establishing goals of care  Subjective: [name] [name] is awake alert sitting up in bed.  He simply states that he feels poorly.  He denies any chest pain denies any shortness of breath.  He must have eaten about 35-40% of his breakfast.  He states that he got cold by the time he actually had a chance to eat his breakfast.  We discussed about how this current hospitalization is going as well as next steps specifically skilled nursing facility rehabilitation attempt with outpatient palliative services.  He is in agreement.  Additionally, call placed and also discussed with patient's son Dr. Tressia Miners on the phone at [phone].  See below.   Length of Stay: 3  Current Medications: Scheduled Meds:  . [START ON 07/18/2019] amiodarone  200 mg Oral Daily  . amiodarone  400 mg Oral BID  . apixaban  2.5 mg Oral BID  . Chlorhexidine Gluconate Cloth  6 each Topical Q0600  . cholecalciferol  2,000 Units Oral Daily  . doxycycline  100 mg Oral Q12H  . feeding supplement (ENSURE ENLIVE)  237 mL Oral Q24H  . ferrous sulfate  650 mg Oral Q breakfast  . furosemide  40 mg Intravenous BID  . metoprolol succinate  50 mg Oral Daily  . mirabegron ER  50 mg Oral Daily  . mirtazapine  7.5 mg Oral QHS  . multivitamin with minerals  1 tablet Oral Daily  . mupirocin ointment  1 application Nasal BID  . pantoprazole  40 mg Oral QAC breakfast  . predniSONE  10 mg Oral Q breakfast  . sodium chloride flush  3 mL Intravenous Q12H  .  umeclidinium-vilanterol  1 puff Inhalation Daily  . vitamin B-12  500 mcg Oral Q M,W,F    Continuous Infusions: . sodium chloride      PRN Meds: sodium chloride, acetaminophen, albuterol, fluticasone, ondansetron (ZOFRAN) IV, oxyCODONE, psyllium, sodium chloride flush  Physical Exam         Frail-appearing elderly gentleman sitting up in bed Patient has pacemaker Patient has bruises on chest wall  as well as bilateral upper extremities he is on anticoagulation Patient has weeping lesions bilateral lower extremities S1-S2 irregular, heart rate is in the 70s Abdomen is not distended  Vital Signs: BP 116/70 (BP Location: Right Arm)   Pulse 83   Temp 98.1 F (36.7 C) (Oral)   Resp 18   Ht 5\' 10"  (1.778 m)   Wt 69.2 kg   SpO2 98%   BMI 21.89 kg/m  SpO2: SpO2: 98 % O2 Device: O2 Device: Room Air O2 Flow Rate: O2 Flow Rate (L/min): 3 L/min  Intake/output summary:   Intake/Output Summary (Last 24 hours) at 07/13/2019 1225 Last data filed at 07/13/2019 0800 Gross per 24 hour  Intake 273.35 ml  Output 800 ml  Net -526.65 ml   LBM: Last BM Date: 07/10/19 Baseline Weight: Weight: 69.2 kg Most recent weight: Weight: 69.2 kg       Palliative Assessment/Data:      Patient Active Problem List   Diagnosis Date Noted  . Stage 3b chronic kidney disease   . Acute combined systolic and diastolic heart failure ([place])   . Hypotension   . Acute exacerbation of CHF (congestive heart failure) ([place]) 07/10/2019  . History of cardioversion 04/29/2019  . Sacral fracture, closed ([place]) 04/13/2018  . PCO (posterior capsular opacification), bilateral 05/26/2017  . Weakness of right leg 04/20/2017  . 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 Exudative age-related macular degeneration of right eye with active choroidal neovascularization ([place]) 09/30/2016  . Pseudophakia of both eyes 09/30/2016  . Impingement syndrome of right shoulder 06/16/2016  .  Impingement syndrome of left shoulder 06/16/2016  . Bilateral leg edema 06/14/2016  . Dizziness and giddiness 06/26/2015  . Abnormality of gait 06/26/2015  . Anxiety 04/14/2015  . GERD (gastroesophageal reflux disease) 04/03/2015  . OSA (obstructive sleep apnea) 04/03/2015  . HLD (hyperlipidemia) 04/03/2015  . SOB (shortness of breath)   . Left elbow fracture 03/29/2015  . Dizziness 01/01/2015  . LBP (low back pain) 01/01/2015  . Malignant neoplasm of prostate ([place]) 01/01/2015  . BP (high blood pressure) 01/01/2015  . DOE (dyspnea on exertion) 10/26/2013  . Essential hypertension, benign 10/26/2013  . Post-traumatic wound infection 10/17/2012  . Cellulitis 10/17/2012  . Atrial fibrillation with RVR ([place]) 10/22/2011  . DVT, lower extremity, distal, chronic ([place]) 10/22/2011  . Pacemaker 10/19/2011  . Prostate cancer ([place]) 10/19/2011  . COPD (chronic obstructive pulmonary disease) with emphysema ([place]) 02/04/2011  . Anemia 12/10/2010    Palliative Care Assessment & Plan   Patient Profile:    Assessment: 86 year old gentleman with sick sinus syndrome status post pacemaker, persistent atrial fibrillation, stage III CKD, COPD, history of prior DVT, history of hypertension.  Patient admitted to the hospital with acute on chronic systolic and diastolic heart failure and atrial fibrillation with rapid ventricular response.  Found to have left ventricular ejection fraction of 15-20% on surface echocardiogram.  Patient was started on diuretics as well as rate control medications.  Cardiology was consulted.  Palliative consultation for broad goals of care discussions.  Recommendations/Plan:  Family meeting over the phone:  Call placed and discussed with patient's son Dr. Tressia Miners who has arrived from [place].  We reviewed about the patient's current hospitalization, underlying medical conditions as well as care from a cardiology perspective.  Shared with him that patient is rate  controlled but still in atrial fibrillation at the time of my visit early this morning.  He is on IV  amiodarone.  He is under consideration for possible cardioversion attempt after IV diuresis attempt.  Renal function is also being followed.  Discussed about decline trajectory from atrial fibrillation and heart failure standpoint alongside patient's other medical conditions such as COPD. Reviewed patient's medication list including his current opioid use for chronic back pain, spinal stenosis.  Discussed about patient's ongoing functional decline.  Discussed about possible element of mild depression or at least adjustment disorder due to patient's ongoing decline and debility.  Will initiate low-dose Remeron and monitor.  Continue current scope of care.  Recommend skilled nursing facility rehabilitation attempt with outpatient palliative services.  Discussed that outpatient palliative services can follow along and provide recommendation for when it would be appropriate to switch to full scope of hospice services in the outpatient setting after discharge if the patient has ongoing decline and decompensation.  We reviewed again about patient's elected CODE STATUS of DO NOT RESUSCITATE.  Goals wishes and values important to the patient and family as a unit attempted to be explored.  Offered active listening and supportive care.   Code Status:    Code Status Orders  (From admission, onward)         Start     Ordered   07/11/19 0853  Do not attempt resuscitation (DNR)  Continuous    Question Answer Comment  In the event of cardiac or respiratory ARREST Do not call a "code blue"   In the event of cardiac or respiratory ARREST Do not perform Intubation, CPR, defibrillation or ACLS   In the event of cardiac or respiratory ARREST Use medication by any route, position, wound care, and other measures to relive pain and suffering. May use oxygen, suction and manual treatment of airway obstruction as needed for  comfort.      07/11/19 0852        Code Status History    Date Active Date Inactive Code Status Order ID Comments User Context   07/11/2019 0000 07/11/2019 0852 Full Code [id number]  Harvie Bridge, DO ED   07/11/2019 0000 07/11/2019 0000 DNR [id number]  Harvie Bridge, DO ED   02/10/2018 1506 02/10/2018 1916 Full Code [id number]  Deboraha Sprang, MD Inpatient   10/17/2012 1527 10/24/2012 2207 DNR [id number]  Domenic Polite, MD Inpatient   10/19/2011 2325 10/25/2011 2356 Full Code [id number]  Ethel Rana, RN Inpatient   Advance Care Planning Activity       Prognosis:  Guarded.   Discharge Planning:  [place] for rehab with Palliative care service follow-up  Care plan was discussed with patient, son Dr Tressia Miners on the phone.    Thank you for allowing the Palliative Medicine Team to assist in the care of this patient.   Time In: 11.30 Time Out: 12.05 Total Time 35 Prolonged Time Billed  no       Greater than 50%  of this time was spent counseling and coordinating care related to the above assessment and plan.  Loistine Chance, MD  Please contact Palliative Medicine Team phone at [phone] for questions and concerns.

## 2019-07-13 NOTE — Progress Notes (Signed)
Progress Note  Patient Name: [name] [name] Date of Encounter: 07/13/2019  Primary Cardiologist: Larae Grooms, MD   Subjective   Patient put out 823mL urine overnight. Still has LLE. No chest pain. He remains in afib with controlled rates.   Inpatient Medications    Scheduled Meds: . apixaban  2.5 mg Oral BID  . Chlorhexidine Gluconate Cloth  6 each Topical Q0600  . cholecalciferol  2,000 Units Oral Daily  . doxycycline  100 mg Oral Q12H  . feeding supplement (ENSURE ENLIVE)  237 mL Oral Q24H  . ferrous sulfate  650 mg Oral Q breakfast  . furosemide  20 mg Intravenous BID  . metoprolol succinate  50 mg Oral Daily  . mirabegron ER  50 mg Oral Daily  . multivitamin with minerals  1 tablet Oral Daily  . mupirocin ointment  1 application Nasal BID  . pantoprazole  40 mg Oral QAC breakfast  . predniSONE  10 mg Oral Q breakfast  . sodium chloride flush  3 mL Intravenous Q12H  . umeclidinium-vilanterol  1 puff Inhalation Daily  . vitamin B-12  500 mcg Oral Q M,W,F   Continuous Infusions: . sodium chloride    . amiodarone 30 mg/hr (07/13/19 0800)   PRN Meds: sodium chloride, acetaminophen, albuterol, fluticasone, ondansetron (ZOFRAN) IV, oxyCODONE, psyllium, sodium chloride flush   Vital Signs    Vitals:   07/13/19 0600 07/13/19 0700 07/13/19 0800 07/13/19 0940  BP: 110/62 111/70 116/70   Pulse: 72 81 83   Resp: 13 (!) 9 18   Temp:   98.1 F (36.7 C)   TempSrc:   Oral   SpO2: 97% 98% 93% 98%  Weight:      Height:        Intake/Output Summary (Last 24 hours) at 07/13/2019 1045 Last data filed at 07/13/2019 0800 Gross per 24 hour  Intake 273.35 ml  Output 800 ml  Net -526.65 ml   Last 3 Weights 07/13/2019 07/12/2019 07/11/2019  Weight (lbs) 152 lb 8.9 oz 152 lb 8.9 oz 152 lb 8.9 oz  Weight (kg) 69.2 kg 69.2 kg 69.2 kg      Telemetry    Afib HR 70-80s, PVCs- Personally Reviewed  ECG    No new - Personally Reviewed  Physical Exam   GEN: No acute  distress.   Neck: Mild JVD Cardiac: Irreg Irreg, + murmurs, rubs, or gallops.  Respiratory: Diminished at bases GI: Soft, nontender, non-distended  MS: 2+ edema; No deformity. Neuro:  Nonfocal  Psych: Normal affect   Labs    High Sensitivity Troponin:   Recent Labs  Lab 07/10/19 1620 07/10/19 1820  TROPONINIHS 68* 73*      Chemistry Recent Labs  Lab 07/10/19 1620 07/10/19 1620 07/11/19 0537 07/12/19 0258 07/13/19 0255  NA 142   < > 141 142 137  K 4.5   < > 3.8 4.0 4.0  CL 104   < > 102 102 103  CO2 25   < > 27 25 24   GLUCOSE 124*   < > 106* 105* 109*  BUN 45*   < > 47* 45* 48*  CREATININE 1.42*   < > 1.34* 1.28* 1.35*  CALCIUM 8.7*   < > 8.7* 8.5* 8.3*  PROT 5.7*  --   --   --   --   ALBUMIN 3.4*  --   --   --   --   AST 38  --   --   --   --  ALT 30  --   --   --   --   ALKPHOS 81  --   --   --   --   BILITOT 1.9*  --   --   --   --   GFRNONAA 42*   < > 45* 48* 45*  GFRAA 49*   < > 53* 56* 52*  ANIONGAP 13   < > 12 15 10    < > = values in this interval not displayed.     Hematology Recent Labs  Lab 07/10/19 1620 07/12/19 0258 07/13/19 0255  WBC 11.6* 11.5* 10.4  RBC 3.70* 3.70* 3.58*  HGB 12.5* 12.7* 12.2*  HCT 41.7 40.8 40.1  MCV 112.7* 110.3* 112.0*  MCH 33.8 34.3* 34.1*  MCHC 30.0 31.1 30.4  RDW 14.9 15.0 14.8  PLT 130* 115* 126*    BNP Recent Labs  Lab 07/10/19 1620  BNP 1,038.0*     DDimer  Recent Labs  Lab 07/10/19 2220  DDIMER 1.60*     Radiology    ECHOCARDIOGRAM COMPLETE  Result Date: 07/11/2019    ECHOCARDIOGRAM REPORT   Patient Name:   DONNA DECOTEAU Date of Exam: 07/11/2019 Medical Rec #:  [id number]         Height:       70.0 in Accession #:    [id number]        Weight:       155.0 lb Date of Birth:  12-19-1924          BSA:          1.873 m Patient Age:    84 years          BP:           124/78 mmHg Patient Gender: M                 HR:           70 bpm. Exam Location:  Inpatient Procedure: 2D Echo Indications:    CHF  428.31  History:        Patient has prior history of Echocardiogram examinations, most                 recent 11/21/2012. Pacemaker, COPD; Risk Factors:Hypertension and                 Former Smoker. Pulmonary hypertension. sinus node dysfunction.  Sonographer:    Jannett Celestine RDCS (AE) Referring Phys: [id number] ALEXIS HUGELMEYER  Sonographer Comments: limited mobility IMPRESSIONS  1. Severely reduced LV function, 15-20%. LVOT VTI 8 cm. Left ventricular ejection fraction, by estimation, is 15-20%. The left ventricle has severely decreased function. The left ventricle demonstrates global hypokinesis. Left ventricular diastolic function could not be evaluated.  2. Right ventricular systolic function is moderately reduced. The right ventricular size is moderately enlarged. There is moderately elevated pulmonary artery systolic pressure. The estimated right ventricular systolic pressure is [id number] mmHg.  3. Left atrial size was moderately dilated.  4. Right atrial size was moderately dilated.  5. The mitral valve is degenerative. Moderate to severe mitral valve regurgitation. No evidence of mitral stenosis.  6. The aortic valve is tricuspid and severely calcified with restricted movement in systole. The gradients are quite low given the severely reduced LV function (LVOT VTI 8 cm, SV=34 cc, SVi=18 cc/m2). AVA by VTI is 1.44 cm2 and DI is 0.34, consistent with moderate aortic stenosis. If there are concerns for severe aortic stenosis, would recommend an  aortic valve calcium score for clarification. The aortic valve is tricuspid. Aortic valve regurgitation is not visualized. Moderate to severe aortic valve  stenosis. Aortic valve area, by VTI measures 1.44 cm. Aortic valve mean gradient measures 5.0 mmHg. Aortic valve Vmax measures 1.50 m/s. FINDINGS  Left Ventricle: Severely reduced LV function, 15-20%. LVOT VTI 8 cm. Left ventricular ejection fraction, by estimation, is 15-20%. The left ventricle has severely decreased  function. The left ventricle demonstrates global hypokinesis. The left ventricular internal cavity size was normal in size. There is no left ventricular hypertrophy. Left ventricular diastolic function could not be evaluated due to atrial fibrillation. Left ventricular diastolic function could not be evaluated. Right Ventricle: The right ventricular size is moderately enlarged. No increase in right ventricular wall thickness. Right ventricular systolic function is moderately reduced. There is moderately elevated pulmonary artery systolic pressure. The tricuspid  regurgitant velocity is 3.16 m/s, and with an assumed right atrial pressure of 10 mmHg, the estimated right ventricular systolic pressure is [id number] mmHg. Left Atrium: Left atrial size was moderately dilated. Right Atrium: Right atrial size was moderately dilated. Pericardium: Trivial pericardial effusion is present. Mitral Valve: The mitral valve is degenerative in appearance. Mild mitral annular calcification. Moderate to severe mitral valve regurgitation. No evidence of mitral valve stenosis. Tricuspid Valve: The tricuspid valve is grossly normal. Tricuspid valve regurgitation is mild . No evidence of tricuspid stenosis. Aortic Valve: The aortic valve is tricuspid and severely calcified with restricted movement in systole. The gradients are quite low given the severely reduced LV function (LVOT VTI 8 cm, SV=34 cc, SVi=18 cc/m2). AVA by VTI is 1.44 cm2 and DI is 0.34, consistent with moderate aortic stenosis. If there are concerns for severe aortic stenosis, would recommend an aortic valve calcium score for clarification. The aortic valve is tricuspid. Aortic valve regurgitation is not visualized. Moderate to severe aortic stenosis is present. Aortic valve mean gradient measures 5.0 mmHg. Aortic valve peak gradient measures 9.0 mmHg. Aortic valve area, by VTI measures 1.44 cm. Pulmonic Valve: The pulmonic valve was grossly normal. Pulmonic valve  regurgitation is not visualized. No evidence of pulmonic stenosis. Aorta: The aortic root is normal in size and structure. IAS/Shunts: The atrial septum is grossly normal. Additional Comments: A pacer wire is visualized in the right atrium and right ventricle. There is a small pleural effusion in the left lateral region.  LEFT VENTRICLE PLAX 2D LVIDd:         5.20 cm LVIDs:         4.10 cm LV PW:         1.10 cm LV IVS:        0.90 cm LVOT diam:     2.30 cm LV SV:         34 LV SV Index:   18 LVOT Area:     4.15 cm  LEFT ATRIUM         Index LA diam:    4.60 cm 2.46 cm/m  AORTIC VALVE AV Area (Vmax):    1.27 cm AV Area (Vmean):   1.29 cm AV Area (VTI):     1.44 cm AV Vmax:           149.87 cm/s AV Vmean:          104.497 cm/s AV VTI:            0.238 m AV Peak Grad:      9.0 mmHg AV Mean Grad:      5.0  mmHg LVOT Vmax:         45.70 cm/s LVOT Vmean:        32.500 cm/s LVOT VTI:          0.082 m LVOT/AV VTI ratio: 0.35  AORTA Ao Root diam: 3.20 cm MITRAL VALVE               TRICUSPID VALVE MV Area (PHT): 2.11 cm    TR Peak grad:   39.9 mmHg MV Decel Time: 359 msec    TR Vmax:        316.00 cm/s MR Peak grad: 84.8 mmHg MR Mean grad: 50.0 mmHg    SHUNTS MR Vmax:      460.50 cm/s  Systemic VTI:  0.08 m MR Vmean:     329.0 cm/s   Systemic Diam: 2.30 cm MV E velocity: 56.10 cm/s Eleonore Chiquito MD Electronically signed by Eleonore Chiquito MD Signature Date/Time: 07/11/2019/4:04:21 PM    Final    VAS [place] LOWER EXTREMITY VENOUS (DVT)  Result Date: 07/12/2019  Lower Venous DVTStudy Indications: Swelling, and Edema.  Comparison Study: NO PRIOR Performing Technologist: Abram Sander RVS  Examination Guidelines: A complete evaluation includes B-mode imaging, spectral Doppler, color Doppler, and power Doppler as needed of all accessible portions of each vessel. Bilateral testing is considered an integral part of a complete examination. Limited examinations for reoccurring indications may be performed as noted. The reflux  portion of the exam is performed with the patient in reverse Trendelenburg.  +---------+---------------+---------+-----------+----------+--------------+ RIGHT    CompressibilityPhasicitySpontaneityPropertiesThrombus Aging +---------+---------------+---------+-----------+----------+--------------+ CFV      Full           Yes      Yes                                 +---------+---------------+---------+-----------+----------+--------------+ SFJ      Full                                                        +---------+---------------+---------+-----------+----------+--------------+ FV Prox  Full                                                        +---------+---------------+---------+-----------+----------+--------------+ FV Mid   Full                                                        +---------+---------------+---------+-----------+----------+--------------+ FV DistalFull                                                        +---------+---------------+---------+-----------+----------+--------------+ PFV      Full                                                        +---------+---------------+---------+-----------+----------+--------------+  POP      Full           Yes      Yes                                 +---------+---------------+---------+-----------+----------+--------------+ PTV      Full                                                        +---------+---------------+---------+-----------+----------+--------------+ PERO                                                  Not visualized +---------+---------------+---------+-----------+----------+--------------+   +---------+---------------+---------+-----------+----------+--------------+ LEFT     CompressibilityPhasicitySpontaneityPropertiesThrombus Aging +---------+---------------+---------+-----------+----------+--------------+ CFV      Full           Yes      Yes                                  +---------+---------------+---------+-----------+----------+--------------+ SFJ      Full                                                        +---------+---------------+---------+-----------+----------+--------------+ FV Prox  Full                                                        +---------+---------------+---------+-----------+----------+--------------+ FV Mid   Full                                                        +---------+---------------+---------+-----------+----------+--------------+ FV DistalFull                                                        +---------+---------------+---------+-----------+----------+--------------+ PFV      Full                                                        +---------+---------------+---------+-----------+----------+--------------+ POP      Full           Yes      Yes                                 +---------+---------------+---------+-----------+----------+--------------+  PTV      Full                                                        +---------+---------------+---------+-----------+----------+--------------+ PERO                                                  Not visualized +---------+---------------+---------+-----------+----------+--------------+     Summary: BILATERAL: - No evidence of deep vein thrombosis seen in the lower extremities, bilaterally. -   *See table(s) above for measurements and observations. Electronically signed by Servando Snare MD on 07/12/2019 at 3:37:31 PM.    Final     Cardiac Studies   Echo 07/11/19 1. Severely reduced LV function, 15-20%. LVOT VTI 8 cm. Left ventricular  ejection fraction, by estimation, is 15-20%. The left ventricle has  severely decreased function. The left ventricle demonstrates global  hypokinesis. Left ventricular diastolic  function could not be evaluated.  2. Right ventricular systolic function is  moderately reduced. The right  ventricular size is moderately enlarged. There is moderately elevated  pulmonary artery systolic pressure. The estimated right ventricular  systolic pressure is [id number] mmHg.  3. Left atrial size was moderately dilated.  4. Right atrial size was moderately dilated.  5. The mitral valve is degenerative. Moderate to severe mitral valve  regurgitation. No evidence of mitral stenosis.  6. The aortic valve is tricuspid and severely calcified with restricted  movement in systole. The gradients are quite low given the severely  reduced LV function (LVOT VTI 8 cm, SV=34 cc, SVi=18 cc/m2). AVA by VTI is  1.44 cm2 and DI is 0.34, consistent  with moderate aortic stenosis. If there are concerns for severe aortic  stenosis, would recommend an aortic valve calcium score for clarification.  The aortic valve is tricuspid. Aortic valve regurgitation is not  visualized. Moderate to severe aortic valve  stenosis. Aortic valve area, by VTI measures 1.44 cm. Aortic valve mean  gradient measures 5.0 mmHg. Aortic valve Vmax measures 1.50 m/s.   Patient Profile     86 y.o. male with a hx of sick sinus syndrome status post pacemaker, persistent atrial fibrillation, CKD 3, COPD, prior DVT and hypertension who is being seen today for the evaluation of acute systolic and diastolic heart failure and weakness.  Assessment & Plan    Acute systolic and diastolic HF - LVEF is down to 15-20% - On IV lasix 20 mg BID - He takes Lasix 20mg  daily at baseline - Exacerbation possibly due to afib RVR - continue BB - He put out 834mL urine overnight. Weight unchanged at 152lbs - creatinine 1.28>1.35 - He still has significant fluid on him. Will increase lasix dose  Persistent Afib - underwent DCCV 04/02/19 and was seen back 3/21 and still very symptomatic, atrial pacing in the 80s. PPM 3/23 check showed 1 AF episode since DCCV - PPM interrogated to see how long he has been in afib>>will  check with MD result - He remains in afib, rates are in the 70-80s - Continue Metoprolol - Amiodarone added with plan to atempt cardioversion once he is loaded. Can likely put on schedule for Monday - Eliquis 2.5mg BID  Elevated Troponin -  HS troponin peaked at 73 - likely demand ischemia int he setting of afib RVR and acute CHF - EKG with no ischemic changes  Moderate AS/Moderate MR - AS mean gradient of 57mmHG. Likely only moderate AS  For questions or updates, please contact [place] Please consult [email or web address] for contact info under        Signed, Sadat Sliwa Ninfa Meeker, PA-C  07/13/2019, 10:45 AM

## 2019-07-13 NOTE — Progress Notes (Signed)
PROGRESS NOTE  [name] [name] [id number] DOB: [birth year] DOA: 07/10/2019 PCP: Lawerance Cruel, MD  HPI/Recap of past 24 hours: HPI from Dr Ara Kussmaul [name] [name] is a 86 y.o. male with a known history of Afib with PPM, CKD3, COPD,  presents to the emergency department for evaluation of weakness, SOB, LE edema.  Patient was in a usual state of health until about 10 days ago when he started to become progressively weaker, with worsening LE edema and now SOB.  Lives at home with his wife who is 27, was ambulating with a walker, but now needs assistance.  He did sustain a fall 3 weeks ago but recovered. In the ER he was found to be in afib with RVR, increased vascular congestion. Pt was given Lasix and started on a Cardizem drip.  Foley cath put out 300cc dark yellow urine so far. Patient has been taking medication as prescribed and there has been no recent change in medication or diet.  No recent antibiotics.  There has been no recent illness, hospitalizations, travel or sick contacts.  Patient admitted for further management.     Today, patient denies any new complaints, denies any worsening shortness of breath, chest pain, abdominal pain, nausea/vomiting, fever/chills.    Assessment/Plan: Active Problems:   Atrial fibrillation with RVR (HCC)   Acute exacerbation of CHF (congestive heart failure) (HCC)   Stage 3b chronic kidney disease   Acute combined systolic and diastolic heart failure (HCC)   Hypotension   Palliative care by specialist   Goals of care, counseling/discussion   General weakness   Paroxysmal A. fib with RVR Heart rate better controlled TSH WNL Status post amiodarone drip, switch to p.o. amiodarone for rhythm control Cardiology on board, continue Eliquis, Toprol.  May need cardioversion once euvolemic Telemetry  New onset acute systolic HF/moderate aortic stenosis/moderate aortic regurgitation Patient presents with shortness of breath, BLE edema BNP  elevated 1038 Likely cardiomyopathy due to A. fib as his pacemaker was interrogated and noted to have about 78% burden A. fib, been consistently in A. fib since May 22, 2019 Troponin mildly elevated with a flat trend, 68-73 Chest x-ray suggestive of vascular congestion Echo showed EF of 15 to 20%, global hypokinesis, moderately elevated pulmonary artery systolic pressure, [id number] mmhg, moderate to severe mitral valve regurgitation Cardiology consulted, appreciate recs Continue Lasix Strict I's and O's, daily weights  Elevated troponin Chest pain-free Likely 2/2 demand ischemia from above Troponin mildly elevated with a flat trend, 68-73 EKG shows A. Fib Cardiology consulted, telemetry  AKI on CKD stage 3B Likely 2/2 above/renal congestion Continue diuresis Daily BMP  Generalized weakness/debility/fall Likely 2/2 HF CT head unremarkable PT/OT  Elevated D-dimer D-dimer 1.6 We will hold off on CTA chest for now due to CKD, lower extremity Doppler negative for DVT Patient already on Eliquis, continue  Bilateral lower extremity edema/wound Afebrile, with improving leukocytosis Left leg with weeping of serous drainage Lower extremity Doppler negative for DVT Wound care consult, recs appreciated Continue doxycycline  History of COPD Continue nebs as needed, Flonase, prednisone  History of GERD Continue Protonix        Malnutrition Type:  Nutrition Problem: Increased nutrient needs Etiology: acute illness   Malnutrition Characteristics:  Signs/Symptoms: estimated needs   Nutrition Interventions:  Interventions: Ensure Enlive (each supplement provides 350kcal and 20 grams of protein), MVI    Estimated body mass index is 21.89 kg/m as calculated from the following:   Height as of this encounter: 5\' 10"  (  1.778 m).   Weight as of this encounter: 69.2 kg.     Code Status: DNR  Family Communication: Discussed with son who is a physician on  07/12/2019  Disposition Plan: Status is: Inpatient  Remains inpatient appropriate because:Inpatient level of care appropriate due to severity of illness   Dispo: The patient is from: Home              Anticipated d/c is to: SNF              Anticipated d/c date is: 2 days              Patient currently is not medically stable to d/c.  New onset HF, requiring IV diuresis    Consultants:  Cardiology  Procedures:  None  Antimicrobials:  Doxycycline  DVT prophylaxis: Apixaban   Objective: Vitals:   07/13/19 1100 07/13/19 1200 07/13/19 1300 07/13/19 1400  BP:   107/69 112/73  Pulse: 87 79 75 83  Resp: (!) 23 (!) 22 (!) 23 (!) 21  Temp:  98.4 F (36.9 C)    TempSrc:  Oral    SpO2: 96% 96% 91% 96%  Weight:      Height:        Intake/Output Summary (Last 24 hours) at 07/13/2019 1705 Last data filed at 07/13/2019 0800 Gross per 24 hour  Intake 273.35 ml  Output 800 ml  Net -526.65 ml   Filed Weights   07/11/19 1900 07/12/19 0412 07/13/19 0419  Weight: 69.2 kg 69.2 kg 69.2 kg    Exam:  General: NAD, chronically ill-appearing, frail, HOH  Cardiovascular: S1, S2 present  Respiratory:  Bibasilar crackles noted  Abdomen: Soft, nontender, nondistended, bowel sounds present  Musculoskeletal: 1+ bilateral pedal edema noted, LLE with serous weeping noted  Skin:  As mentioned above  Psychiatry: Normal mood   Data Reviewed: CBC: Recent Labs  Lab 07/10/19 1620 07/12/19 0258 07/13/19 0255  WBC 11.6* 11.5* 10.4  NEUTROABS 10.8* 10.3* 9.3*  HGB 12.5* 12.7* 12.2*  HCT 41.7 40.8 40.1  MCV 112.7* 110.3* 112.0*  PLT 130* 115* 123XX123*   Basic Metabolic Panel: Recent Labs  Lab 07/10/19 1620 07/11/19 0001 07/11/19 0537 07/12/19 0258 07/13/19 0255  NA 142  --  141 142 137  K 4.5  --  3.8 4.0 4.0  CL 104  --  102 102 103  CO2 25  --  27 25 24   GLUCOSE 124*  --  106* 105* 109*  BUN 45*  --  47* 45* 48*  CREATININE 1.42*  --  1.34* 1.28* 1.35*  CALCIUM  8.7*  --  8.7* 8.5* 8.3*  MG  --  2.2  --   --   --    GFR: Estimated Creatinine Clearance: 33.5 mL/min (A) (by C-G formula based on SCr of 1.35 mg/dL (H)). Liver Function Tests: Recent Labs  Lab 07/10/19 1620  AST 38  ALT 30  ALKPHOS 81  BILITOT 1.9*  PROT 5.7*  ALBUMIN 3.4*   No results for input(s): LIPASE, AMYLASE in the last 168 hours. No results for input(s): AMMONIA in the last 168 hours. Coagulation Profile: No results for input(s): INR, PROTIME in the last 168 hours. Cardiac Enzymes: No results for input(s): CKTOTAL, CKMB, CKMBINDEX, TROPONINI in the last 168 hours. BNP (last 3 results) No results for input(s): PROBNP in the last 8760 hours. HbA1C: No results for input(s): HGBA1C in the last 72 hours. CBG: No results for input(s): GLUCAP in the last 168  hours. Lipid Profile: No results for input(s): CHOL, HDL, LDLCALC, TRIG, CHOLHDL, LDLDIRECT in the last 72 hours. Thyroid Function Tests: Recent Labs    07/11/19 0001  TSH 1.512   Anemia Panel: No results for input(s): VITAMINB12, FOLATE, FERRITIN, TIBC, IRON, RETICCTPCT in the last 72 hours. Urine analysis:    Component Value Date/Time   COLORURINE AMBER (A) 07/10/2019 1635   APPEARANCEUR CLEAR 07/10/2019 1635   LABSPEC 1.027 07/10/2019 1635   PHURINE 5.0 07/10/2019 1635   GLUCOSEU NEGATIVE 07/10/2019 1635   HGBUR NEGATIVE 07/10/2019 1635   BILIRUBINUR NEGATIVE 07/10/2019 1635   KETONESUR NEGATIVE 07/10/2019 1635   PROTEINUR 100 (A) 07/10/2019 1635   UROBILINOGEN 1.0 08/06/2009 1001   NITRITE NEGATIVE 07/10/2019 1635   LEUKOCYTESUR NEGATIVE 07/10/2019 1635   Sepsis Labs: @LABRCNTIP (procalcitonin:4,lacticidven:4)  ) Recent Results (from the past 240 hour(s))  Urine culture     Status: Abnormal   Collection Time: 07/10/19  4:35 PM   Specimen: Urine, Clean Catch  Result Value Ref Range Status   Specimen Description   Final    URINE, CLEAN CATCH Performed at [facility], [place]  [address], [place], [place] [address]    Special Requests   Final    NONE Performed at [facility], [place] [address], [place], [place] [address]    Culture MULTIPLE SPECIES PRESENT, SUGGEST RECOLLECTION (A)  Final   Report Status 07/12/2019 FINAL  Final  SARS Coronavirus 2 by RT PCR (hospital order, performed in [facility] hospital lab) Nasopharyngeal Nasopharyngeal Swab     Status: None   Collection Time: 07/10/19  7:44 PM   Specimen: Nasopharyngeal Swab  Result Value Ref Range Status   SARS Coronavirus 2 NEGATIVE NEGATIVE Final    Comment: (NOTE) SARS-CoV-2 target nucleic acids are NOT DETECTED. The SARS-CoV-2 RNA is generally detectable in upper and lower respiratory specimens during the acute phase of infection. The lowest concentration of SARS-CoV-2 viral copies this assay can detect is 250 copies / mL. A negative result does not preclude SARS-CoV-2 infection and should not be used as the sole basis for treatment or other patient management decisions.  A negative result may occur with improper specimen collection / handling, submission of specimen other than nasopharyngeal swab, presence of viral mutation(s) within the areas targeted by this assay, and inadequate number of viral copies (<250 copies / mL). A negative result must be combined with clinical observations, patient history, and epidemiological information. Fact Sheet for Patients:   StrictlyIdeas.no Fact Sheet for Healthcare Providers: BankingDealers.co.za This test is not yet approved or cleared  by the [place] FDA and has been authorized for detection and/or diagnosis of SARS-CoV-2 by FDA under an Emergency Use Authorization (EUA).  This EUA will remain in effect (meaning this test can be used) for the duration of the COVID-19 declaration under Section 564(b)(1) of the Act, 21 U.S.C. section 360bbb-3(b)(1), unless the authorization is terminated  or revoked sooner. Performed at [facility], [place] [address] [place], [place] [address]   MRSA PCR Screening     Status: Abnormal   Collection Time: 07/11/19  7:43 PM   Specimen: Nasopharyngeal  Result Value Ref Range Status   MRSA by PCR POSITIVE (A) NEGATIVE Final    Comment:        The GeneXpert MRSA Assay (FDA approved for NASAL specimens only), is one component of a comprehensive MRSA colonization surveillance program. It is not intended to diagnose MRSA infection nor to guide or  monitor treatment for MRSA infections. RESULT CALLED TO, READ BACK BY AND VERIFIED WITH: S.NESMITH AT 2220 ON 07/11/19 BY N.THOMPSON Performed at [facility], [place] [address] [place], [place] [address]       Studies: No results found.  Scheduled Meds: . [START ON 07/18/2019] amiodarone  200 mg Oral Daily  . amiodarone  400 mg Oral BID  . apixaban  2.5 mg Oral BID  . Chlorhexidine Gluconate Cloth  6 each Topical Q0600  . cholecalciferol  2,000 Units Oral Daily  . doxycycline  100 mg Oral Q12H  . feeding supplement (ENSURE ENLIVE)  237 mL Oral Q24H  . ferrous sulfate  650 mg Oral Q breakfast  . furosemide  40 mg Intravenous BID  . metoprolol succinate  50 mg Oral Daily  . mirabegron ER  50 mg Oral Daily  . mirtazapine  7.5 mg Oral QHS  . multivitamin with minerals  1 tablet Oral Daily  . mupirocin ointment  1 application Nasal BID  . pantoprazole  40 mg Oral QAC breakfast  . predniSONE  10 mg Oral Q breakfast  . sodium chloride flush  3 mL Intravenous Q12H  . umeclidinium-vilanterol  1 puff Inhalation Daily  . vitamin B-12  500 mcg Oral Q M,W,F    Continuous Infusions: . sodium chloride       LOS: 3 days     Alma Friendly, MD [facility]  If 7PM-7AM, please contact night-coverage [email or web address] 07/13/2019, 5:05 PM

## 2019-07-13 NOTE — NC FL2 (Signed)
[place] LEVEL OF CARE SCREENING TOOL     IDENTIFICATION  Patient Name: [name] [name] Birthdate: 07-Aug-1924 Sex: male Admission Date (Current Location): 07/10/2019  [facility] [place] Number:  Herbalist and Address:  [facility],  [place], [place]      Provider Number: [id number]  Attending Physician Name and Address:  Alma Friendly, MD  Relative Name and Phone Number:       Current Level of Care: Hospital Recommended Level of Care: [place] Prior Approval Number:    Date Approved/Denied:   PASRR Number: [id number]  Discharge Plan: SNF    Current Diagnoses: Patient Active Problem List   Diagnosis Date Noted  . Stage 3b chronic kidney disease   . Acute combined systolic and diastolic heart failure ([place])   . Hypotension   . Acute exacerbation of CHF (congestive heart failure) ([place]) 07/10/2019  . History of cardioversion 04/29/2019  . Sacral fracture, closed ([place]) 04/13/2018  . PCO (posterior capsular opacification), bilateral 05/26/2017  . Weakness of right leg 04/20/2017  . 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 Exudative age-related macular degeneration of right eye with active choroidal neovascularization ([place]) 09/30/2016  . Pseudophakia of both eyes 09/30/2016  . Impingement syndrome of right shoulder 06/16/2016  . Impingement syndrome of left shoulder 06/16/2016  . Bilateral leg edema 06/14/2016  . Dizziness and giddiness 06/26/2015  . Abnormality of gait 06/26/2015  . Anxiety 04/14/2015  . GERD (gastroesophageal reflux disease) 04/03/2015  . OSA (obstructive sleep apnea) 04/03/2015  . HLD (hyperlipidemia) 04/03/2015  . SOB (shortness of breath)   . Left elbow fracture 03/29/2015  . Dizziness 01/01/2015  . LBP (low back pain) 01/01/2015  . Malignant neoplasm of prostate ([place]) 01/01/2015  . BP (high blood pressure) 01/01/2015  .  DOE (dyspnea on exertion) 10/26/2013  . Essential hypertension, benign 10/26/2013  . Post-traumatic wound infection 10/17/2012  . Cellulitis 10/17/2012  . Atrial fibrillation with RVR ([place]) 10/22/2011  . DVT, lower extremity, distal, chronic ([place]) 10/22/2011  . Pacemaker 10/19/2011  . Prostate cancer ([place]) 10/19/2011  . COPD (chronic obstructive pulmonary disease) with emphysema ([place]) 02/04/2011  . Anemia 12/10/2010    Orientation RESPIRATION BLADDER Height & Weight     Self, Time, Situation, Place  Normal Continent Weight: 152 lb 8.9 oz (69.2 kg) Height:  5\' 10"  (177.8 cm)  BEHAVIORAL SYMPTOMS/MOOD NEUROLOGICAL BOWEL NUTRITION STATUS      Continent Diet(see dc summary)  AMBULATORY STATUS COMMUNICATION OF NEEDS Skin   Extensive Assist Verbally Normal                       Personal Care Assistance Level of Assistance  Bathing, Feeding, Dressing Bathing Assistance: Limited assistance Feeding assistance: Independent Dressing Assistance: Limited assistance     Functional Limitations Info  Sight, Hearing, Speech Sight Info: Adequate Hearing Info: Adequate Speech Info: Adequate    SPECIAL CARE FACTORS FREQUENCY  PT (By licensed PT), OT (By licensed OT)     PT Frequency: 5x week OT Frequency: 3x week            Contractures Contractures Info: Not present    Additional Factors Info  Code Status, Allergies Code Status Info: DNR Allergies Info: NKA           Current Medications (07/13/2019):  This is the current hospital active medication list Current Facility-Administered Medications  Medication Dose  Route Frequency Provider Last Rate Last Admin  . 0.9 %  sodium chloride infusion  250 mL Intravenous PRN Hugelmeyer, Alexis, DO      . acetaminophen (TYLENOL) tablet 650 mg  650 mg Oral Q4H PRN Hugelmeyer, Alexis, DO      . albuterol (PROVENTIL) (2.5 MG/3ML) 0.083% nebulizer solution 2.5 mg  2.5 mg Nebulization Q6H PRN Alma Friendly, MD      . amiodarone  (NEXTERONE PREMIX) 360-4.14 MG/200ML-% (1.8 mg/mL) IV infusion  30 mg/hr Intravenous Continuous Skeet Latch, MD 16.67 mL/hr at 07/13/19 0800 30 mg/hr at 07/13/19 0800  . apixaban (ELIQUIS) tablet 2.5 mg  2.5 mg Oral BID Alma Friendly, MD   2.5 mg at 07/13/19 0851  . Chlorhexidine Gluconate Cloth 2 % PADS 6 each  6 each Topical Q0600 Alma Friendly, MD   6 each at 07/12/19 1521  . cholecalciferol (VITAMIN D) tablet 2,000 Units  2,000 Units Oral Daily Hugelmeyer, Alexis, DO   2,000 Units at 07/13/19 0850  . doxycycline (VIBRA-TABS) tablet 100 mg  100 mg Oral Q12H Alma Friendly, MD   100 mg at 07/13/19 0850  . feeding supplement (ENSURE ENLIVE) (ENSURE ENLIVE) liquid 237 mL  237 mL Oral Q24H Alma Friendly, MD   237 mL at 07/12/19 1001  . ferrous sulfate tablet 650 mg  650 mg Oral Q breakfast Hugelmeyer, Alexis, DO   650 mg at 07/12/19 1450  . fluticasone (FLONASE) 50 MCG/ACT nasal spray 1 spray  1 spray Each Nare Daily PRN Hugelmeyer, Alexis, DO      . furosemide (LASIX) injection 40 mg  40 mg Intravenous BID Furth, Cadence H, PA-C      . metoprolol succinate (TOPROL-XL) 24 hr tablet 50 mg  50 mg Oral Daily Skeet Latch, MD   50 mg at 07/13/19 0850  . mirabegron ER (MYRBETRIQ) tablet 50 mg  50 mg Oral Daily Hugelmeyer, Alexis, DO   50 mg at 07/13/19 0851  . multivitamin with minerals tablet 1 tablet  1 tablet Oral Daily Alma Friendly, MD   1 tablet at 07/12/19 1450  . mupirocin ointment (BACTROBAN) 2 % 1 application  1 application Nasal BID Alma Friendly, MD   1 application at [id number] [phone]  . ondansetron (ZOFRAN) injection 4 mg  4 mg Intravenous Q6H PRN Hugelmeyer, Alexis, DO      . oxyCODONE (Oxy IR/ROXICODONE) immediate release tablet 5 mg  5 mg Oral Q6H PRN Hugelmeyer, Alexis, DO   5 mg at 07/12/19 2136  . pantoprazole (PROTONIX) EC tablet 40 mg  40 mg Oral QAC breakfast Hugelmeyer, Alexis, DO   40 mg at 07/13/19 0851  . predniSONE (DELTASONE)  tablet 10 mg  10 mg Oral Q breakfast Hugelmeyer, Alexis, DO   10 mg at 07/13/19 [id number]  . psyllium (HYDROCIL/METAMUCIL) packet 1 packet  1 packet Oral Daily PRN Hugelmeyer, Alexis, DO      . sodium chloride flush (NS) 0.9 % injection 3 mL  3 mL Intravenous Q12H Hugelmeyer, Alexis, DO   3 mL at 07/13/19 0852  . sodium chloride flush (NS) 0.9 % injection 3 mL  3 mL Intravenous PRN Hugelmeyer, Alexis, DO      . umeclidinium-vilanterol (ANORO ELLIPTA) 62.5-25 MCG/INH 1 puff  1 puff Inhalation Daily Hugelmeyer, Alexis, DO   1 puff at 07/13/19 0939  . vitamin B-12 (CYANOCOBALAMIN) tablet 500 mcg  500 mcg Oral Q M,W,F Hugelmeyer, Alexis, DO   500 mcg at 07/13/19 [phone]  Discharge Medications: Please see discharge summary for a list of discharge medications.  Relevant Imaging Results:  Relevant Lab Results:   Additional Information SSN: [id number] [place]

## 2019-07-13 NOTE — TOC Progression Note (Signed)
Transition of Care [facility]) - Progression Note    Patient Details  Name: [name] [name] MRN: [id number] Date of Birth: [birth year]  [facility] CM/SW Contact  Shade Flood, LCSW Phone Number: 07/13/2019, 11:58 AM  Clinical Narrative:     TOC following. Reviewed pt's record. PT/OT recommending SNF rehab unless family can provide 24/7 care. Palliative Medicine also following and notes indicate that at this time, if pt makes some meaningful recovery in the hospital, SNF rehab with outpatient Palliative referral is recommended. Other options would be recommended if pt is not demonstrating recovery.  Pt sleeping at time of TOC visit today. Spoke with pt's son, [name], by phone today to discuss potential SNF needs at dc. Son reports that pt has been at [facility] once in the past. He states that they would be agreeable to SNF if needed at the time of dc. He states that his brother who is a physician and coming into town are to meet with Palliative Medicine tomorrow. He is agreeable to Local SNF referrals and if needed at the time of dc, they will select from bed offers made.  Will send out and assigned TOC will follow.  Expected Discharge Plan: [place] Barriers to Discharge: Continued Medical Work up  Expected Discharge Plan and Services Expected Discharge Plan: [place]   Discharge Planning Services: CM Consult   Living arrangements for the past 2 months: Single Family Home                                       Social Determinants of Health (SDOH) Interventions    Readmission Risk Interventions No flowsheet data found.

## 2019-07-14 DIAGNOSIS — R531 Weakness: Secondary | ICD-10-CM

## 2019-07-14 DIAGNOSIS — I509 Heart failure, unspecified: Secondary | ICD-10-CM

## 2019-07-14 LAB — CBC WITH DIFFERENTIAL/PLATELET
Abs Immature Granulocytes: 0.04 10*3/uL (ref 0.00–0.07)
Basophils Absolute: 0 10*3/uL (ref 0.0–0.1)
Basophils Relative: 0 %
Eosinophils Absolute: 0 10*3/uL (ref 0.0–0.5)
Eosinophils Relative: 0 %
HCT: 40.7 % (ref 39.0–52.0)
Hemoglobin: 12.1 g/dL — ABNORMAL LOW (ref 13.0–17.0)
Immature Granulocytes: 1 %
Lymphocytes Relative: 8 %
Lymphs Abs: 0.6 10*3/uL — ABNORMAL LOW (ref 0.7–4.0)
MCH: 33.2 pg (ref 26.0–34.0)
MCHC: 29.7 g/dL — ABNORMAL LOW (ref 30.0–36.0)
MCV: 111.8 fL — ABNORMAL HIGH (ref 80.0–100.0)
Monocytes Absolute: 0.4 10*3/uL (ref 0.1–1.0)
Monocytes Relative: 6 %
Neutro Abs: 6.6 10*3/uL (ref 1.7–7.7)
Neutrophils Relative %: 85 %
Platelets: 125 10*3/uL — ABNORMAL LOW (ref 150–400)
RBC: 3.64 MIL/uL — ABNORMAL LOW (ref 4.22–5.81)
RDW: 14.9 % (ref 11.5–15.5)
WBC: 7.8 10*3/uL (ref 4.0–10.5)
nRBC: 0 % (ref 0.0–0.2)

## 2019-07-14 LAB — BASIC METABOLIC PANEL
Anion gap: 11 (ref 5–15)
BUN: 51 mg/dL — ABNORMAL HIGH (ref 8–23)
CO2: 26 mmol/L (ref 22–32)
Calcium: 8.2 mg/dL — ABNORMAL LOW (ref 8.9–10.3)
Chloride: 98 mmol/L (ref 98–111)
Creatinine, Ser: 1.31 mg/dL — ABNORMAL HIGH (ref 0.61–1.24)
GFR calc Af Amer: 54 mL/min — ABNORMAL LOW (ref >60)
GFR calc non Af Amer: 47 mL/min — ABNORMAL LOW (ref >60)
Glucose, Bld: 100 mg/dL — ABNORMAL HIGH (ref 70–99)
Potassium: 3.8 mmol/L (ref 3.5–5.1)
Sodium: 135 mmol/L (ref 135–145)

## 2019-07-14 MED ORDER — CAPSAICIN 0.025 % EX CREA
TOPICAL_CREAM | Freq: Two times a day (BID) | CUTANEOUS | Status: DC
  Filled 2019-07-14: qty 60

## 2019-07-14 NOTE — Progress Notes (Signed)
Daily Progress Note   Patient Name: [name] [name]       Date: 07/14/2019 DOB: Aug 19, 1924  Age: 86 y.o. MRN#: [id number] Attending Physician: Alma Friendly, MD Primary Care Physician: Lawerance Cruel, MD Admit Date: 07/10/2019  Reason for Consultation/Follow-up: Establishing goals of care  Subjective: [name] [name] is resting in bed, denies complaints, hasn't felt hungry this morning. See below.   Length of Stay: 4  Current Medications: Scheduled Meds:  . [START ON 07/18/2019] amiodarone  200 mg Oral Daily  . amiodarone  400 mg Oral BID  . apixaban  2.5 mg Oral BID  . Chlorhexidine Gluconate Cloth  6 each Topical Q0600  . cholecalciferol  2,000 Units Oral Daily  . doxycycline  100 mg Oral Q12H  . feeding supplement (ENSURE ENLIVE)  237 mL Oral Q24H  . ferrous sulfate  650 mg Oral Q breakfast  . furosemide  40 mg Intravenous BID  . metoprolol succinate  50 mg Oral Daily  . mirabegron ER  50 mg Oral Daily  . mirtazapine  7.5 mg Oral QHS  . multivitamin with minerals  1 tablet Oral Daily  . mupirocin ointment  1 application Nasal BID  . pantoprazole  40 mg Oral QAC breakfast  . predniSONE  10 mg Oral Q breakfast  . sodium chloride flush  3 mL Intravenous Q12H  . umeclidinium-vilanterol  1 puff Inhalation Daily  . vitamin B-12  500 mcg Oral Q M,W,F    Continuous Infusions: . sodium chloride      PRN Meds: sodium chloride, acetaminophen, albuterol, fluticasone, ondansetron (ZOFRAN) IV, oxyCODONE, psyllium, sodium chloride flush  Physical Exam         Frail-appearing elderly gentleman resting in bed Patient has pacemaker Patient has bruises on chest wall as well as bilateral upper extremities he is on anticoagulation Patient has weeping lesions bilateral lower  extremities S1-S2 irregular, heart rate is in the 70s Abdomen is not distended  Vital Signs: BP (!) 112/57   Pulse 95   Temp 98.1 F (36.7 C) (Axillary)   Resp 13   Ht 5\' 10"  (1.778 m)   Wt 70.8 kg   SpO2 92%   BMI 22.40 kg/m  SpO2: SpO2: 92 % O2 Device: O2 Device: Room Air O2 Flow Rate: O2 Flow Rate (L/min): 3 L/min  Intake/output summary:   Intake/Output Summary (Last 24 hours) at 07/14/2019 1137 Last data filed at 07/14/2019 0540 Gross per 24 hour  Intake 75.28 ml  Output 1200 ml  Net -1124.72 ml   LBM: Last BM Date: 07/13/19 Baseline Weight: Weight: 69.2 kg Most recent weight: Weight: 70.8 kg       Palliative Assessment/Data:      Patient Active Problem List   Diagnosis Date Noted  . Palliative care by specialist   . Goals of care, counseling/discussion   . General weakness   . Stage 3b chronic kidney disease   . Acute combined systolic and diastolic heart failure ([place])   . Hypotension   . Acute exacerbation of CHF (congestive heart failure) ([place]) 07/10/2019  . History of cardioversion 04/29/2019  . Sacral fracture, closed ([place]) 04/13/2018  . PCO (posterior capsular opacification), bilateral 05/26/2017  . Weakness of right leg 04/20/2017  . 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 Exudative age-related macular degeneration of right eye with active choroidal neovascularization ([place]) 09/30/2016  . Pseudophakia of both eyes 09/30/2016  . Impingement syndrome of right shoulder 06/16/2016  . Impingement syndrome of left shoulder 06/16/2016  . Bilateral leg edema 06/14/2016  . Dizziness and giddiness 06/26/2015  . Abnormality of gait 06/26/2015  . Anxiety 04/14/2015  . GERD (gastroesophageal reflux disease) 04/03/2015  . OSA (obstructive sleep apnea) 04/03/2015  . HLD (hyperlipidemia) 04/03/2015  . SOB (shortness of breath)   . Left elbow fracture 03/29/2015  . Dizziness 01/01/2015  . LBP (low back pain)  01/01/2015  . Malignant neoplasm of prostate ([place]) 01/01/2015  . BP (high blood pressure) 01/01/2015  . DOE (dyspnea on exertion) 10/26/2013  . Essential hypertension, benign 10/26/2013  . Post-traumatic wound infection 10/17/2012  . Cellulitis 10/17/2012  . Atrial fibrillation with RVR ([place]) 10/22/2011  . DVT, lower extremity, distal, chronic ([place]) 10/22/2011  . Pacemaker 10/19/2011  . Prostate cancer ([place]) 10/19/2011  . COPD (chronic obstructive pulmonary disease) with emphysema ([place]) 02/04/2011  . Anemia 12/10/2010    Palliative Care Assessment & Plan   Patient Profile:    Assessment: 86 year old gentleman with sick sinus syndrome status post pacemaker, persistent atrial fibrillation, stage III CKD, COPD, history of prior DVT, history of hypertension.  Patient admitted to the hospital with acute on chronic systolic and diastolic heart failure and atrial fibrillation with rapid ventricular response.  Found to have left ventricular ejection fraction of 15-20% on surface echocardiogram.  Patient was started on diuretics as well as rate control medications.  Cardiology was consulted.  Palliative consultation for broad goals of care discussions.  Recommendations/Plan:  Continue current mode of care  Encourage PO, OOB.   SNF rehab with palliative on discharge.   PMT will not follow daily, no additional recommendations at this time other then as listed above.      Code Status:    Code Status Orders  (From admission, onward)         Start     Ordered   07/11/19 0853  Do not attempt resuscitation (DNR)  Continuous    Question Answer Comment  In the event of cardiac or respiratory ARREST Do not call a "code blue"   In the event of cardiac or respiratory ARREST Do not perform Intubation, CPR, defibrillation or ACLS   In the event of cardiac or respiratory ARREST Use medication by any route, position, wound care, and other measures to relive pain and suffering. May use  oxygen, suction and manual treatment of airway obstruction as needed for comfort.      07/11/19 0852        Code Status History    Date Active Date Inactive Code Status Order ID Comments User Context   07/11/2019 0000 07/11/2019 0852 Full Code [id number]  Harvie Bridge, DO ED   07/11/2019 0000 07/11/2019 0000 DNR [id number]  Harvie Bridge, DO ED   02/10/2018 1506 02/10/2018 1916 Full Code [id number]  Deboraha Sprang, MD Inpatient   10/17/2012 1527 10/24/2012 2207 DNR [id number]  Domenic Polite, MD Inpatient   10/19/2011 2325 10/25/2011 2356 Full Code [id number]  Ethel Rana, RN Inpatient   Advance Care Planning Activity       Prognosis:  Guarded.   Discharge Planning:  [facility] for rehab with Palliative care service follow-up  Care plan was discussed with patient and bedside staff.       Thank you for allowing the Palliative Medicine Team to assist in the care of this patient.   Time In: 9 Time Out: 9.15 Total Time 15 Prolonged Time Billed  no       Greater than 50%  of this time was spent counseling and coordinating care related to the above assessment and plan.  Loistine Chance, MD  Please contact Palliative Medicine Team phone at [phone] for questions and concerns.

## 2019-07-14 NOTE — Progress Notes (Addendum)
PROGRESS NOTE  [name] [name] [id number] DOB: [birth year] DOA: 07/10/2019 PCP: [name] Cruel, MD  HPI/Recap of past 24 hours: HPI from Dr [name] [name] is a 86 y.o. male with a known history of Afib with PPM, CKD3, COPD,  presents to the emergency department for evaluation of weakness, SOB, LE edema.  Patient was in a usual state of health until about 10 days ago when he started to become progressively weaker, with worsening LE edema and now SOB.  Lives at home with his wife who is 31, was ambulating with a walker, but now needs assistance.  He did sustain a fall 3 weeks ago but recovered. In the ER he was found to be in afib with RVR, increased vascular congestion. Pt was given Lasix and started on a Cardizem drip.  Foley cath put out 300cc dark yellow urine so far. Patient has been taking medication as prescribed and there has been no recent change in medication or diet.  No recent antibiotics.  There has been no recent illness, hospitalizations, travel or sick contacts.  Patient admitted for further management.    Today, met patient resting comfortably in bed, a bit disoriented this morning. Patient denied any new complaints.    Assessment/Plan: Active Problems:   Atrial fibrillation with RVR (HCC)   Acute exacerbation of CHF (congestive heart failure) (HCC)   Stage 3b chronic kidney disease   Acute combined systolic and diastolic heart failure (HCC)   Hypotension   Palliative care by specialist   Goals of care, counseling/discussion   General weakness   Acute congestive heart failure (HCC)   Paroxysmal A. fib with RVR Heart rate better controlled TSH WNL Status post amiodarone drip, switch to p.o. amiodarone for rhythm control Cardiology on board, continue Eliquis, Toprol.  May need cardioversion once euvolemic (likely Tuesday) Telemetry  New onset acute systolic HF/moderate aortic stenosis/moderate aortic regurgitation Patient presents with shortness  of breath, BLE edema BNP elevated 1038 Likely cardiomyopathy due to A. fib as his pacemaker was interrogated and noted to have about 78% burden A. fib, been consistently in A. fib since May 22, 2019 Troponin mildly elevated with a flat trend, 68-73 Chest x-ray suggestive of vascular congestion Echo showed EF of 15 to 20%, global hypokinesis, moderately elevated pulmonary artery systolic pressure, 29.5 mmhg, moderate to severe mitral valve regurgitation Cardiology consulted, appreciate recs Continue Lasix Strict I's and O's, daily weights  Elevated troponin Chest pain-free Likely 2/2 demand ischemia from above Troponin mildly elevated with a flat trend, 68-73 EKG shows A. Fib Cardiology consulted, telemetry  AKI on CKD stage 3B Likely 2/2 above/renal congestion Continue diuresis Daily BMP  Generalized weakness/debility/fall Likely 2/2 HF CT head unremarkable PT/OT  Elevated D-dimer D-dimer 1.6 We will hold off on CTA chest for now due to CKD, lower extremity Doppler negative for DVT Patient already on Eliquis, continue  Bilateral lower extremity edema/wound Afebrile, with improving leukocytosis Left leg with weeping of serous drainage Lower extremity Doppler negative for DVT Wound care consult, recs appreciated Continue doxycycline  History of COPD Continue nebs as needed, Flonase, prednisone  History of GERD Continue Protonix        Malnutrition Type:  Nutrition Problem: Increased nutrient needs Etiology: acute illness   Malnutrition Characteristics:  Signs/Symptoms: estimated needs   Nutrition Interventions:  Interventions: Ensure Enlive (each supplement provides 350kcal and 20 grams of protein), MVI    Estimated body mass index is 22.4 kg/m as calculated from the following:  Height as of this encounter: _0  (1.778 m).   Weight as of this encounter: 70.8 kg.     Code Status: DNR  Family Communication: Discussed with son who is a  physician on 07/14/2019  Disposition Plan: Status is: Inpatient  Remains inpatient appropriate because:Inpatient level of care appropriate due to severity of illness   Dispo: The patient is from: Home              Anticipated d/c is to: SNF              Anticipated d/c date is: 2 days              Patient currently is not medically stable to d/c.  New onset HF, requiring IV diuresis, possible cardioversion    Consultants:  Cardiology  Procedures:  None  Antimicrobials:  Doxycycline  DVT prophylaxis: Apixaban   Objective: Vitals:   07/14/19 1215 07/14/19 1237 07/14/19 1300 07/14/19 1326  BP: (!) 85/46 107/74 121/73   Pulse: 83 75 (!) 52   Resp: (!) _1 Temp:      TempSrc:      SpO2: 96% 96% 96% 95%  Weight:      Height:        Intake/Output Summary (Last 24 hours) at 07/14/2019 1509 Last data filed at 07/14/2019 1300 Gross per 24 hour  Intake 325.28 ml  Output 1200 ml  Net -874.72 ml   Filed Weights   07/12/19 0412 07/13/19 0419 07/14/19 0500  Weight: 69.2 kg 69.2 kg 70.8 kg    Exam:  General: NAD, chronically ill-appearing, frail, HOH, noted chest/BUE bruising   Cardiovascular: S1, S2 present  Respiratory:  Bibasilar crackles noted  Abdomen: Soft, nontender, nondistended, bowel sounds present  Musculoskeletal: Trace bilateral pedal edema noted  Skin:  As mentioned above  Psychiatry: Normal mood   Data Reviewed: CBC: Recent Labs  Lab 07/10/19 1620 07/12/19 0258 07/13/19 0255 07/14/19 0250  WBC 11.6* 11.5* 10.4 7.8  NEUTROABS 10.8* 10.3* 9.3* 6.6  HGB 12.5* 12.7* 12.2* 12.1*  HCT 41.7 40.8 40.1 40.7  MCV 112.7* 110.3* 112.0* 111.8*  PLT 130* 115* 126* 035*   Basic Metabolic Panel: Recent Labs  Lab 07/10/19 1620 07/11/19 0001 07/11/19 0537 07/12/19 0258 07/13/19 0255 07/14/19 0250  NA 142  --  141 142 137 135  K 4.5  --  3.8 4.0 4.0 3.8  CL 104  --  102 102 103 98  CO2 25  --  _2 GLUCOSE 124*  --  106* 105*  109* 100*  BUN 45*  --  47* 45* 48* 51*  CREATININE 1.42*  --  1.34* 1.28* 1.35* 1.31*  CALCIUM 8.7*  --  8.7* 8.5* 8.3* 8.2*  MG  --  2.2  --   --   --   --    GFR: Estimated Creatinine Clearance: 35.3 mL/min (A) (by C-G formula based on SCr of 1.31 mg/dL (H)). Liver Function Tests: Recent Labs  Lab 07/10/19 1620  AST 38  ALT 30  ALKPHOS 81  BILITOT 1.9*  PROT 5.7*  ALBUMIN 3.4*   No results for input(s): LIPASE, AMYLASE in the last 168 hours. No results for input(s): AMMONIA in the last 168 hours. Coagulation Profile: No results for input(s): INR, PROTIME in the last 168 hours. Cardiac Enzymes: No results for input(s): CKTOTAL, CKMB, CKMBINDEX, TROPONINI in the last 168 hours. BNP (last 3 results) No results for input(s): PROBNP in  the last 8760 hours. HbA1C: No results for input(s): HGBA1C in the last 72 hours. CBG: No results for input(s): GLUCAP in the last 168 hours. Lipid Profile: No results for input(s): CHOL, HDL, LDLCALC, TRIG, CHOLHDL, LDLDIRECT in the last 72 hours. Thyroid Function Tests: No results for input(s): TSH, T4TOTAL, FREET4, T3FREE, THYROIDAB in the last 72 hours. Anemia Panel: No results for input(s): VITAMINB12, FOLATE, FERRITIN, TIBC, IRON, RETICCTPCT in the last 72 hours. Urine analysis:    Component Value Date/Time   COLORURINE AMBER (A) 07/10/2019 1635   APPEARANCEUR CLEAR 07/10/2019 1635   LABSPEC 1.027 07/10/2019 1635   PHURINE 5.0 07/10/2019 1635   GLUCOSEU NEGATIVE 07/10/2019 1635   HGBUR NEGATIVE 07/10/2019 1635   BILIRUBINUR NEGATIVE 07/10/2019 1635   KETONESUR NEGATIVE 07/10/2019 1635   PROTEINUR 100 (A) 07/10/2019 1635   UROBILINOGEN 1.0 08/06/2009 1001   NITRITE NEGATIVE 07/10/2019 1635   LEUKOCYTESUR NEGATIVE 07/10/2019 1635   Sepsis Labs: _0 (procalcitonin:4,lacticidven:4)  ) Recent Results (from the past 240 hour(s))  Urine culture     Status: Abnormal   Collection Time: 07/10/19  4:35 PM   Specimen: Urine,  Clean Catch  Result Value Ref Range Status   Specimen Description   Final    URINE, CLEAN CATCH Performed at [facility], [place] [address], [place], [place] [address]    Special Requests   Final    NONE Performed at [facility], [place] [address], [place], [place] [address]    Culture MULTIPLE SPECIES PRESENT, SUGGEST RECOLLECTION (A)  Final   Report Status 07/12/2019 FINAL  Final  SARS Coronavirus 2 by RT PCR (hospital order, performed in [facility] hospital lab) Nasopharyngeal Nasopharyngeal Swab     Status: None   Collection Time: 07/10/19  7:44 PM   Specimen: Nasopharyngeal Swab  Result Value Ref Range Status   SARS Coronavirus 2 NEGATIVE NEGATIVE Final    Comment: (NOTE) SARS-CoV-2 target nucleic acids are NOT DETECTED. The SARS-CoV-2 RNA is generally detectable in upper and lower respiratory specimens during the acute phase of infection. The lowest concentration of SARS-CoV-2 viral copies this assay can detect is 250 copies / mL. A negative result does not preclude SARS-CoV-2 infection and should not be used as the sole basis for treatment or other patient management decisions.  A negative result may occur with improper specimen collection / handling, submission of specimen other than nasopharyngeal swab, presence of viral mutation(s) within the areas targeted by this assay, and inadequate number of viral copies (<250 copies / mL). A negative result must be combined with clinical observations, patient history, and epidemiological information. Fact Sheet for Patients:   StrictlyIdeas.no Fact Sheet for Healthcare Providers: BankingDealers.co.za This test is not yet approved or cleared  by the [place] FDA and has been authorized for detection and/or diagnosis of SARS-CoV-2 by FDA under an Emergency Use Authorization (EUA).  This EUA will remain in effect (meaning this test can be used) for  the duration of the COVID-19 declaration under Section 564(b)(1) of the Act, 21 U.S.C. section 360bbb-3(b)(1), unless the authorization is terminated or revoked sooner. Performed at [facility], [place] [address] [place], [place] [address]   MRSA PCR Screening     Status: Abnormal   Collection Time: 07/11/19  7:43 PM   Specimen: Nasopharyngeal  Result Value Ref Range Status   MRSA by PCR POSITIVE (A) NEGATIVE Final    Comment:        The GeneXpert MRSA Assay (FDA  approved for NASAL specimens only), is one component of a comprehensive MRSA colonization surveillance program. It is not intended to diagnose MRSA infection nor to guide or monitor treatment for MRSA infections. RESULT CALLED TO, READ BACK BY AND VERIFIED WITH: S.NESMITH AT 2220 ON 07/11/19 BY N.THOMPSON Performed at [facility], [place] [address], [place], [place] [address]       Studies: No results found.  Scheduled Meds: . [START ON 07/18/2019] amiodarone  200 mg Oral Daily  . amiodarone  400 mg Oral BID  . apixaban  2.5 mg Oral BID  . Chlorhexidine Gluconate Cloth  6 each Topical Q0600  . cholecalciferol  2,000 Units Oral Daily  . doxycycline  100 mg Oral Q12H  . feeding supplement (ENSURE ENLIVE)  237 mL Oral Q24H  . ferrous sulfate  650 mg Oral Q breakfast  . furosemide  40 mg Intravenous BID  . metoprolol succinate  50 mg Oral Daily  . mirabegron ER  50 mg Oral Daily  . mirtazapine  7.5 mg Oral QHS  . multivitamin with minerals  1 tablet Oral Daily  . mupirocin ointment  1 application Nasal BID  . pantoprazole  40 mg Oral QAC breakfast  . predniSONE  10 mg Oral Q breakfast  . sodium chloride flush  3 mL Intravenous Q12H  . umeclidinium-vilanterol  1 puff Inhalation Daily  . vitamin B-12  500 mcg Oral Q M,W,F    Continuous Infusions: . sodium chloride       LOS: 4 days     Alma Friendly, MD [facility]  If 7PM-7AM, please contact  night-coverage [email or web address] 07/14/2019, 3:09 PM

## 2019-07-14 NOTE — Progress Notes (Signed)
Progress Note  Patient Name: [name] [name] Date of Encounter: 07/14/2019  Primary Cardiologist: Larae Grooms, MD   Subjective   1L negative - afib is rate controlled on oral amiodarone. Total 3L negative. Creatinine stable.  Inpatient Medications    Scheduled Meds: . [START ON 07/18/2019] amiodarone  200 mg Oral Daily  . amiodarone  400 mg Oral BID  . apixaban  2.5 mg Oral BID  . Chlorhexidine Gluconate Cloth  6 each Topical Q0600  . cholecalciferol  2,000 Units Oral Daily  . doxycycline  100 mg Oral Q12H  . feeding supplement (ENSURE ENLIVE)  237 mL Oral Q24H  . ferrous sulfate  650 mg Oral Q breakfast  . furosemide  40 mg Intravenous BID  . metoprolol succinate  50 mg Oral Daily  . mirabegron ER  50 mg Oral Daily  . mirtazapine  7.5 mg Oral QHS  . multivitamin with minerals  1 tablet Oral Daily  . mupirocin ointment  1 application Nasal BID  . pantoprazole  40 mg Oral QAC breakfast  . predniSONE  10 mg Oral Q breakfast  . sodium chloride flush  3 mL Intravenous Q12H  . umeclidinium-vilanterol  1 puff Inhalation Daily  . vitamin B-12  500 mcg Oral Q M,W,F   Continuous Infusions: . sodium chloride     PRN Meds: sodium chloride, acetaminophen, albuterol, fluticasone, ondansetron (ZOFRAN) IV, oxyCODONE, psyllium, sodium chloride flush   Vital Signs    Vitals:   07/14/19 0300 07/14/19 0400 07/14/19 0500 07/14/19 0600  BP: (!) 114/92 118/86 115/73 103/63  Pulse: 84 (!) 58 82 (!) 58  Resp: 13 14 11 13   Temp:  (!) 97.5 F (36.4 C)    TempSrc:  Axillary    SpO2: 97% 95% 97% 92%  Weight:   70.8 kg   Height:        Intake/Output Summary (Last 24 hours) at 07/14/2019 0809 Last data filed at 07/14/2019 0540 Gross per 24 hour  Intake 75.28 ml  Output 1200 ml  Net -1124.72 ml   Last 3 Weights 07/14/2019 07/13/2019 07/12/2019  Weight (lbs) 156 lb 1.4 oz 152 lb 8.9 oz 152 lb 8.9 oz  Weight (kg) 70.8 kg 69.2 kg 69.2 kg      Telemetry    Afib HR 70-80s -  Personally Reviewed  ECG    N/A  Physical Exam   GEN: No acute distress.   Neck: no JVD Cardiac: Irreg Irreg, + murmurs, rubs, or gallops.  Respiratory: Diminished at bases GI: Soft, nontender, non-distended  MS: 1-2+ edema; No deformity. Neuro:  Nonfocal  Psych: Normal affect   Labs    High Sensitivity Troponin:   Recent Labs  Lab 07/10/19 1620 07/10/19 1820  TROPONINIHS 68* 73*      Chemistry Recent Labs  Lab 07/10/19 1620 07/11/19 0537 07/12/19 0258 07/13/19 0255 07/14/19 0250  NA 142   < > 142 137 135  K 4.5   < > 4.0 4.0 3.8  CL 104   < > 102 103 98  CO2 25   < > 25 24 26   GLUCOSE 124*   < > 105* 109* 100*  BUN 45*   < > 45* 48* 51*  CREATININE 1.42*   < > 1.28* 1.35* 1.31*  CALCIUM 8.7*   < > 8.5* 8.3* 8.2*  PROT 5.7*  --   --   --   --   ALBUMIN 3.4*  --   --   --   --  AST 38  --   --   --   --   ALT 30  --   --   --   --   ALKPHOS 81  --   --   --   --   BILITOT 1.9*  --   --   --   --   GFRNONAA 42*   < > 48* 45* 47*  GFRAA 49*   < > 56* 52* 54*  ANIONGAP 13   < > 15 10 11    < > = values in this interval not displayed.     Hematology Recent Labs  Lab 07/12/19 0258 07/13/19 0255 07/14/19 0250  WBC 11.5* 10.4 7.8  RBC 3.70* 3.58* 3.64*  HGB 12.7* 12.2* 12.1*  HCT 40.8 40.1 40.7  MCV 110.3* 112.0* 111.8*  MCH 34.3* 34.1* 33.2  MCHC 31.1 30.4 29.7*  RDW 15.0 14.8 14.9  PLT 115* 126* 125*    BNP Recent Labs  Lab 07/10/19 1620  BNP 1,038.0*     DDimer  Recent Labs  Lab 07/10/19 2220  DDIMER 1.60*     Radiology    VAS [place] LOWER EXTREMITY VENOUS (DVT)  Result Date: 07/12/2019  Lower Venous DVTStudy Indications: Swelling, and Edema.  Comparison Study: NO PRIOR Performing Technologist: Abram Sander RVS  Examination Guidelines: A complete evaluation includes B-mode imaging, spectral Doppler, color Doppler, and power Doppler as needed of all accessible portions of each vessel. Bilateral testing is considered an integral part of a  complete examination. Limited examinations for reoccurring indications may be performed as noted. The reflux portion of the exam is performed with the patient in reverse Trendelenburg.  +---------+---------------+---------+-----------+----------+--------------+ RIGHT    CompressibilityPhasicitySpontaneityPropertiesThrombus Aging +---------+---------------+---------+-----------+----------+--------------+ CFV      Full           Yes      Yes                                 +---------+---------------+---------+-----------+----------+--------------+ SFJ      Full                                                        +---------+---------------+---------+-----------+----------+--------------+ FV Prox  Full                                                        +---------+---------------+---------+-----------+----------+--------------+ FV Mid   Full                                                        +---------+---------------+---------+-----------+----------+--------------+ FV DistalFull                                                        +---------+---------------+---------+-----------+----------+--------------+ PFV      Full                                                        +---------+---------------+---------+-----------+----------+--------------+  POP      Full           Yes      Yes                                 +---------+---------------+---------+-----------+----------+--------------+ PTV      Full                                                        +---------+---------------+---------+-----------+----------+--------------+ PERO                                                  Not visualized +---------+---------------+---------+-----------+----------+--------------+   +---------+---------------+---------+-----------+----------+--------------+ LEFT     CompressibilityPhasicitySpontaneityPropertiesThrombus Aging  +---------+---------------+---------+-----------+----------+--------------+ CFV      Full           Yes      Yes                                 +---------+---------------+---------+-----------+----------+--------------+ SFJ      Full                                                        +---------+---------------+---------+-----------+----------+--------------+ FV Prox  Full                                                        +---------+---------------+---------+-----------+----------+--------------+ FV Mid   Full                                                        +---------+---------------+---------+-----------+----------+--------------+ FV DistalFull                                                        +---------+---------------+---------+-----------+----------+--------------+ PFV      Full                                                        +---------+---------------+---------+-----------+----------+--------------+ POP      Full           Yes      Yes                                 +---------+---------------+---------+-----------+----------+--------------+  PTV      Full                                                        +---------+---------------+---------+-----------+----------+--------------+ PERO                                                  Not visualized +---------+---------------+---------+-----------+----------+--------------+     Summary: BILATERAL: - No evidence of deep vein thrombosis seen in the lower extremities, bilaterally. -   *See table(s) above for measurements and observations. Electronically signed by Servando Snare MD on 07/12/2019 at 3:37:31 PM.    Final     Cardiac Studies   Echo 07/11/19 1. Severely reduced LV function, 15-20%. LVOT VTI 8 cm. Left ventricular  ejection fraction, by estimation, is 15-20%. The left ventricle has  severely decreased function. The left ventricle demonstrates global    hypokinesis. Left ventricular diastolic  function could not be evaluated.  2. Right ventricular systolic function is moderately reduced. The right  ventricular size is moderately enlarged. There is moderately elevated  pulmonary artery systolic pressure. The estimated right ventricular  systolic pressure is [id number] mmHg.  3. Left atrial size was moderately dilated.  4. Right atrial size was moderately dilated.  5. The mitral valve is degenerative. Moderate to severe mitral valve  regurgitation. No evidence of mitral stenosis.  6. The aortic valve is tricuspid and severely calcified with restricted  movement in systole. The gradients are quite low given the severely  reduced LV function (LVOT VTI 8 cm, SV=34 cc, SVi=18 cc/m2). AVA by VTI is  1.44 cm2 and DI is 0.34, consistent  with moderate aortic stenosis. If there are concerns for severe aortic  stenosis, would recommend an aortic valve calcium score for clarification.  The aortic valve is tricuspid. Aortic valve regurgitation is not  visualized. Moderate to severe aortic valve  stenosis. Aortic valve area, by VTI measures 1.44 cm. Aortic valve mean  gradient measures 5.0 mmHg. Aortic valve Vmax measures 1.50 m/s.   Patient Profile     86 y.o. male with a hx of sick sinus syndrome status post pacemaker, persistent atrial fibrillation, CKD 3, COPD, prior DVT and hypertension who is being seen today for the evaluation of acute systolic and diastolic heart failure and weakness.  Assessment & Plan    Acute systolic and diastolic HF - LVEF is down to 15-20% - On IV lasix 40 mg BID, continue diuresis - Creatinine stable - Consider DCCV next week  Persistent Afib - underwent DCCV 04/02/19 and was seen back 3/21 and still very symptomatic, atrial pacing in the 80s. PPM 3/23 check showed 1 AF episode since DCCV - PPM interrogated to see how long he has been in afib>>will check with MD result - He remains in afib, rates are in the  70-80s - Continue Metoprolol - Amiodarone added with plan to atempt cardioversion once he is loaded. Can likely put on schedule for Tuesday (Monday is a holiday) - Eliquis 2.5mg BID  Elevated Troponin - HS troponin peaked at 73 - likely demand ischemia int he setting of afib RVR and acute CHF - EKG with no ischemic changes  Moderate AS/Moderate MR -  AS mean gradient of 50mmHG. Likely only moderate AS  For questions or updates, please contact [place] Please consult [email or web address] for contact info under   Pixie Casino, MD, FACC, [place] Director of the [facility] &  [facility] Diplomate of the American Board of Clinical Lipidology Attending Cardiologist  Direct Dial: [phone]  Fax: [phone]  Website:  [email or web address]  Pixie Casino, MD  07/14/2019, 8:09 AM

## 2019-07-14 NOTE — Progress Notes (Signed)
Notified by bedside RN, Baruch Merl in regards to patient's son upset with visitation policy. Notified AC Marian in regards to situation. [facility] Marian and I met with patient's son in main lobby to discuss visitation policy. Explained to son that only 2 designated visitors are allowed at this time. The only time more visitors are allowed is when comfort care is started or patient is actively dying. Patient's son still not happy but complied with policy at this time. AC Marian gave contact information for Patient Experience to patient's son if son would like to discuss issues further.

## 2019-07-15 LAB — BASIC METABOLIC PANEL
Anion gap: 11 (ref 5–15)
BUN: 60 mg/dL — ABNORMAL HIGH (ref 8–23)
CO2: 30 mmol/L (ref 22–32)
Calcium: 8.4 mg/dL — ABNORMAL LOW (ref 8.9–10.3)
Chloride: 99 mmol/L (ref 98–111)
Creatinine, Ser: 1.5 mg/dL — ABNORMAL HIGH (ref 0.61–1.24)
GFR calc Af Amer: 46 mL/min — ABNORMAL LOW (ref >60)
GFR calc non Af Amer: 40 mL/min — ABNORMAL LOW (ref >60)
Glucose, Bld: 95 mg/dL (ref 70–99)
Potassium: 3.8 mmol/L (ref 3.5–5.1)
Sodium: 140 mmol/L (ref 135–145)

## 2019-07-15 LAB — CBC WITH DIFFERENTIAL/PLATELET
Abs Immature Granulocytes: 0.05 10*3/uL (ref 0.00–0.07)
Basophils Absolute: 0 10*3/uL (ref 0.0–0.1)
Basophils Relative: 0 %
Eosinophils Absolute: 0 10*3/uL (ref 0.0–0.5)
Eosinophils Relative: 0 %
HCT: 43.1 % (ref 39.0–52.0)
Hemoglobin: 13.1 g/dL (ref 13.0–17.0)
Immature Granulocytes: 1 %
Lymphocytes Relative: 5 %
Lymphs Abs: 0.6 10*3/uL — ABNORMAL LOW (ref 0.7–4.0)
MCH: 33.2 pg (ref 26.0–34.0)
MCHC: 30.4 g/dL (ref 30.0–36.0)
MCV: 109.4 fL — ABNORMAL HIGH (ref 80.0–100.0)
Monocytes Absolute: 0.7 10*3/uL (ref 0.1–1.0)
Monocytes Relative: 6 %
Neutro Abs: 9 10*3/uL — ABNORMAL HIGH (ref 1.7–7.7)
Neutrophils Relative %: 88 %
Platelets: 136 10*3/uL — ABNORMAL LOW (ref 150–400)
RBC: 3.94 MIL/uL — ABNORMAL LOW (ref 4.22–5.81)
RDW: 14.7 % (ref 11.5–15.5)
WBC: 10.3 10*3/uL (ref 4.0–10.5)
nRBC: 0 % (ref 0.0–0.2)

## 2019-07-15 MED ORDER — FUROSEMIDE 40 MG PO TABS
40.0000 mg | ORAL_TABLET | Freq: Every day | ORAL | Status: DC
  Administered 2019-07-15 – 2019-07-22 (×8): 40 mg via ORAL
  Filled 2019-07-15 (×8): qty 1

## 2019-07-15 MED ORDER — ORAL CARE MOUTH RINSE
15.0000 mL | Freq: Two times a day (BID) | OROMUCOSAL | Status: DC
  Administered 2019-07-18 – 2019-07-25 (×5): 15 mL via OROMUCOSAL

## 2019-07-15 MED ORDER — FUROSEMIDE 40 MG PO TABS: 40.0000 mg | ORAL_TABLET | Freq: Every day | ORAL | Status: DC

## 2019-07-15 MED ORDER — FUROSEMIDE 10 MG/ML IJ SOLN: 40.0000 mg | Freq: Every day | INTRAMUSCULAR | Status: DC

## 2019-07-15 MED ORDER — CHLORHEXIDINE GLUCONATE 0.12 % MT SOLN
15.0000 mL | Freq: Two times a day (BID) | OROMUCOSAL | Status: DC
  Administered 2019-07-15 – 2019-07-25 (×18): 15 mL via OROMUCOSAL
  Filled 2019-07-15 (×18): qty 15

## 2019-07-15 NOTE — Progress Notes (Signed)
Progress Note  Patient Name: [name] [name] Date of Encounter: 07/15/2019  Primary Cardiologist: Larae Grooms, MD   Subjective   Mildly net negative overnight- remains in rate-controlled afib.  Inpatient Medications    Scheduled Meds: . [START ON 07/18/2019] amiodarone  200 mg Oral Daily  . amiodarone  400 mg Oral BID  . apixaban  2.5 mg Oral BID  . capsaicin   Topical BID  . Chlorhexidine Gluconate Cloth  6 each Topical Q0600  . cholecalciferol  2,000 Units Oral Daily  . doxycycline  100 mg Oral Q12H  . feeding supplement (ENSURE ENLIVE)  237 mL Oral Q24H  . ferrous sulfate  650 mg Oral Q breakfast  . furosemide  40 mg Intravenous BID  . metoprolol succinate  50 mg Oral Daily  . mirabegron ER  50 mg Oral Daily  . mirtazapine  7.5 mg Oral QHS  . multivitamin with minerals  1 tablet Oral Daily  . mupirocin ointment  1 application Nasal BID  . pantoprazole  40 mg Oral QAC breakfast  . predniSONE  10 mg Oral Q breakfast  . sodium chloride flush  3 mL Intravenous Q12H  . umeclidinium-vilanterol  1 puff Inhalation Daily  . vitamin B-12  500 mcg Oral Q M,W,F   Continuous Infusions: . sodium chloride     PRN Meds: sodium chloride, acetaminophen, albuterol, fluticasone, ondansetron (ZOFRAN) IV, oxyCODONE, psyllium, sodium chloride flush   Vital Signs    Vitals:   07/15/19 0800 07/15/19 0900 07/15/19 0928 07/15/19 1000  BP: 126/70 104/70  114/64  Pulse: 75 (!) 121  77  Resp: 15 (!) 23  15  Temp: 97.8 F (36.6 C)     TempSrc: Oral     SpO2: 95% (!) 77% 93% 95%  Weight:      Height:        Intake/Output Summary (Last 24 hours) at 07/15/2019 1050 Last data filed at 07/15/2019 0800 Gross per 24 hour  Intake 623 ml  Output 1200 ml  Net -577 ml   Last 3 Weights 07/15/2019 07/14/2019 07/13/2019  Weight (lbs) 154 lb 8.7 oz 156 lb 1.4 oz 152 lb 8.9 oz  Weight (kg) 70.1 kg 70.8 kg 69.2 kg      Telemetry    Afib HR 70-80s - Personally Reviewed  ECG     N/A  Physical Exam   GEN: No acute distress.   Neck: no JVD Cardiac: Irreg Irreg, + murmurs, rubs, or gallops.  Respiratory: Diminished at bases GI: Soft, nontender, non-distended  MS: 1+ edema; No deformity. Neuro:  Nonfocal  Psych: Normal affect   Labs    High Sensitivity Troponin:   Recent Labs  Lab 07/10/19 1620 07/10/19 1820  TROPONINIHS 68* 73*      Chemistry Recent Labs  Lab 07/10/19 1620 07/11/19 0537 07/13/19 0255 07/14/19 0250 07/15/19 0303  NA 142   < > 137 135 140  K 4.5   < > 4.0 3.8 3.8  CL 104   < > 103 98 99  CO2 25   < > 24 26 30   GLUCOSE 124*   < > 109* 100* 95  BUN 45*   < > 48* 51* 60*  CREATININE 1.42*   < > 1.35* 1.31* 1.50*  CALCIUM 8.7*   < > 8.3* 8.2* 8.4*  PROT 5.7*  --   --   --   --   ALBUMIN 3.4*  --   --   --   --  AST 38  --   --   --   --   ALT 30  --   --   --   --   ALKPHOS 81  --   --   --   --   BILITOT 1.9*  --   --   --   --   GFRNONAA 42*   < > 45* 47* 40*  GFRAA 49*   < > 52* 54* 46*  ANIONGAP 13   < > 10 11 11    < > = values in this interval not displayed.     Hematology Recent Labs  Lab 07/13/19 0255 07/14/19 0250 07/15/19 0303  WBC 10.4 7.8 10.3  RBC 3.58* 3.64* 3.94*  HGB 12.2* 12.1* 13.1  HCT 40.1 40.7 43.1  MCV 112.0* 111.8* 109.4*  MCH 34.1* 33.2 33.2  MCHC 30.4 29.7* 30.4  RDW 14.8 14.9 14.7  PLT 126* 125* 136*    BNP Recent Labs  Lab 07/10/19 1620  BNP 1,038.0*     DDimer  Recent Labs  Lab 07/10/19 2220  DDIMER 1.60*     Radiology    No results found.  Cardiac Studies   Echo 07/11/19 1. Severely reduced LV function, 15-20%. LVOT VTI 8 cm. Left ventricular  ejection fraction, by estimation, is 15-20%. The left ventricle has  severely decreased function. The left ventricle demonstrates global  hypokinesis. Left ventricular diastolic  function could not be evaluated.  2. Right ventricular systolic function is moderately reduced. The right  ventricular size is moderately  enlarged. There is moderately elevated  pulmonary artery systolic pressure. The estimated right ventricular  systolic pressure is [id number] mmHg.  3. Left atrial size was moderately dilated.  4. Right atrial size was moderately dilated.  5. The mitral valve is degenerative. Moderate to severe mitral valve  regurgitation. No evidence of mitral stenosis.  6. The aortic valve is tricuspid and severely calcified with restricted  movement in systole. The gradients are quite low given the severely  reduced LV function (LVOT VTI 8 cm, SV=34 cc, SVi=18 cc/m2). AVA by VTI is  1.44 cm2 and DI is 0.34, consistent  with moderate aortic stenosis. If there are concerns for severe aortic  stenosis, would recommend an aortic valve calcium score for clarification.  The aortic valve is tricuspid. Aortic valve regurgitation is not  visualized. Moderate to severe aortic valve  stenosis. Aortic valve area, by VTI measures 1.44 cm. Aortic valve mean  gradient measures 5.0 mmHg. Aortic valve Vmax measures 1.50 m/s.   Patient Profile     86 y.o. male with a hx of sick sinus syndrome status post pacemaker, persistent atrial fibrillation, CKD 3, COPD, prior DVT and hypertension who is being seen today for the evaluation of acute systolic and diastolic heart failure and weakness.  Assessment & Plan    Acute systolic and diastolic HF - LVEF is down to 15-20% - On IV lasix 40 mg BID - would decrease to daily as creatinine jumped to 1.5 (from 1.3)  - Consider DCCV next week  Persistent Afib - underwent DCCV 04/02/19 and was seen back 3/21 and still very symptomatic, atrial pacing in the 80s. PPM 3/23 check showed 1 AF episode since DCCV - PPM interrogated to see how long he has been in afib>>will check with MD result - He remains in afib, rates are in the 70-80s - Continue Metoprolol - Amiodarone added with plan to atempt cardioversion once he is loaded. Can likely put on  schedule for Tuesday (Monday is a  holiday) - Eliquis 2.5mg BID  Elevated Troponin - HS troponin peaked at 73 - likely demand ischemia int he setting of afib RVR and acute CHF - EKG with no ischemic changes  Moderate AS/Moderate MR - AS mean gradient of 40mmHG. Likely only moderate AS  For questions or updates, please contact [place] Please consult [email or web address] for contact info under   Pixie Casino, MD, FACC, [place] Director of the Advanced Lipid Disorders &  Cardiovascular Risk Reduction Clinic Diplomate of the American Board of Clinical Lipidology Attending Cardiologist  Direct Dial: [phone]  Fax: [phone]  Website:  [email or web address]  Pixie Casino, MD  07/15/2019, 10:50 AM

## 2019-07-15 NOTE — Progress Notes (Signed)
Daily Progress Note   Patient Name: [name] [name]       Date: 07/15/2019 DOB: 10-29-1924  Age: 86 y.o. MRN#: [id number] Attending Physician: Alma Friendly, MD Primary Care Physician: Lawerance Cruel, MD Admit Date: 07/10/2019  Reason for Consultation/Follow-up: Establishing goals of care  Subjective: [name] [name] is resting in bed, appears weaker, asleep and not arousing to gentle stimulation at the time of my visit this morning.  Call placed and discussed with [name] as well as with [name].     See below.   Length of Stay: 5  Current Medications: Scheduled Meds:  . [START ON 07/18/2019] amiodarone  200 mg Oral Daily  . amiodarone  400 mg Oral BID  . apixaban  2.5 mg Oral BID  . capsaicin   Topical BID  . Chlorhexidine Gluconate Cloth  6 each Topical Q0600  . cholecalciferol  2,000 Units Oral Daily  . doxycycline  100 mg Oral Q12H  . feeding supplement (ENSURE ENLIVE)  237 mL Oral Q24H  . ferrous sulfate  650 mg Oral Q breakfast  . furosemide  40 mg Oral Daily  . metoprolol succinate  50 mg Oral Daily  . mirabegron ER  50 mg Oral Daily  . mirtazapine  7.5 mg Oral QHS  . multivitamin with minerals  1 tablet Oral Daily  . mupirocin ointment  1 application Nasal BID  . pantoprazole  40 mg Oral QAC breakfast  . predniSONE  10 mg Oral Q breakfast  . sodium chloride flush  3 mL Intravenous Q12H  . umeclidinium-vilanterol  1 puff Inhalation Daily  . vitamin B-12  500 mcg Oral Q M,W,F    Continuous Infusions: . sodium chloride      PRN Meds: sodium chloride, acetaminophen, albuterol, fluticasone, ondansetron (ZOFRAN) IV, oxyCODONE, psyllium, sodium chloride flush  Physical Exam         Frail-appearing elderly gentleman resting in bed Today is  is resting in bed, does not arouse to gentle stimulation.   Patient has weeping lesions bilateral lower extremities S1-S2 irregular, heart rate is in the 70s Abdomen is not distended  Vital Signs: BP 114/64   Pulse 78   Temp 97.9 F (36.6 C) (Oral)   Resp 15   Ht 5\' 10"  (1.778 m)  Wt 70.1 kg   SpO2 93%   BMI 22.17 kg/m  SpO2: SpO2: 93 % O2 Device: O2 Device: Room Air O2 Flow Rate: O2 Flow Rate (L/min): 3 L/min  Intake/output summary:   Intake/Output Summary (Last 24 hours) at 07/15/2019 1333 Last data filed at 07/15/2019 0800 Gross per 24 hour  Intake 423 ml  Output 1200 ml  Net -777 ml   LBM: Last BM Date: 07/14/19 Baseline Weight: Weight: 69.2 kg Most recent weight: Weight: 70.1 kg       Palliative Assessment/Data:      Patient Active Problem List   Diagnosis Date Noted  . Acute congestive heart failure ([place])   . Palliative care by specialist   . Goals of care, counseling/discussion   . General weakness   . Stage 3b chronic kidney disease   . Acute combined systolic and diastolic heart failure ([place])   . Hypotension   . Acute exacerbation of CHF (congestive heart failure) ([place]) 07/10/2019  . History of cardioversion 04/29/2019  . Sacral fracture, closed ([place]) 04/13/2018  . PCO (posterior capsular opacification), bilateral 05/26/2017  . Weakness of right leg 04/20/2017  . 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 Exudative age-related macular degeneration of right eye with active choroidal neovascularization ([place]) 09/30/2016  . Pseudophakia of both eyes 09/30/2016  . Impingement syndrome of right shoulder 06/16/2016  . Impingement syndrome of left shoulder 06/16/2016  . Bilateral leg edema 06/14/2016  . Dizziness and giddiness 06/26/2015  . Abnormality of gait 06/26/2015  . Anxiety 04/14/2015  . GERD (gastroesophageal reflux disease) 04/03/2015  . OSA (obstructive sleep apnea) 04/03/2015  . HLD (hyperlipidemia)  04/03/2015  . SOB (shortness of breath)   . Left elbow fracture 03/29/2015  . Dizziness 01/01/2015  . LBP (low back pain) 01/01/2015  . Malignant neoplasm of prostate ([place]) 01/01/2015  . BP (high blood pressure) 01/01/2015  . DOE (dyspnea on exertion) 10/26/2013  . Essential hypertension, benign 10/26/2013  . Post-traumatic wound infection 10/17/2012  . Cellulitis 10/17/2012  . Atrial fibrillation with RVR ([place]) 10/22/2011  . DVT, lower extremity, distal, chronic ([place]) 10/22/2011  . Pacemaker 10/19/2011  . Prostate cancer ([place]) 10/19/2011  . COPD (chronic obstructive pulmonary disease) with emphysema ([place]) 02/04/2011  . Anemia 12/10/2010    Palliative Care Assessment & Plan   Patient Profile:    Assessment: 86 year old gentleman with sick sinus syndrome status post pacemaker, persistent atrial fibrillation, stage III CKD, COPD, history of prior DVT, history of hypertension.  Patient admitted to the hospital with acute on chronic systolic and diastolic heart failure and atrial fibrillation with rapid ventricular response.  Found to have left ventricular ejection fraction of 15-20% on surface echocardiogram.  Patient was started on diuretics as well as rate control medications.  Cardiology was consulted.  Palliative consultation for broad goals of care discussions.  Recommendations/Plan:  Continue current mode of care  Encourage PO, OOB.   SNF rehab with palliative on discharge.   Goals of care discussions were undertaken in a phone call family meeting with patient's wife and son Dr. Tressia Miners. We reviewed the patient's ongoing hospitalization, next steps and disposition planning. We all acknowledge that the patient has had gradual progressive functional decline, even in the context of this hospitalization. The plan is to attempt cardioversion, then SNF rehab with palliative. At rehab, if the patient has worsening symptoms/ongoing decline, then the palliative services will work  on transitioning the patient over to hospice level of  care, and possibly also to residential hospice, at that point.   PMT to continue to follow. All of their questions and updates provided, to the best of my ability.      Code Status:    Code Status Orders  (From admission, onward)         Start     Ordered   07/11/19 0853  Do not attempt resuscitation (DNR)  Continuous    Question Answer Comment  In the event of cardiac or respiratory ARREST Do not call a "code blue"   In the event of cardiac or respiratory ARREST Do not perform Intubation, CPR, defibrillation or ACLS   In the event of cardiac or respiratory ARREST Use medication by any route, position, wound care, and other measures to relive pain and suffering. May use oxygen, suction and manual treatment of airway obstruction as needed for comfort.      07/11/19 0852        Code Status History    Date Active Date Inactive Code Status Order ID Comments User Context   07/11/2019 0000 07/11/2019 0852 Full Code [id number]  Harvie Bridge, DO ED   07/11/2019 0000 07/11/2019 0000 DNR [id number]  Harvie Bridge, DO ED   02/10/2018 1506 02/10/2018 1916 Full Code [id number]  Deboraha Sprang, MD Inpatient   10/17/2012 1527 10/24/2012 2207 DNR [id number]  Domenic Polite, MD Inpatient   10/19/2011 2325 10/25/2011 2356 Full Code [id number]  Ethel Rana, RN Inpatient   Advance Care Planning Activity       Prognosis:  Guarded.   Discharge Planning:  [facility] for rehab with Palliative care service follow-up  Care plan was discussed with patient's wife and son on the phone and bedside staff.       Thank you for allowing the Palliative Medicine Team to assist in the care of this patient.   Time In: 10 Time Out: 10.35 Total Time 35 Prolonged Time Billed  no       Greater than 50%  of this time was spent counseling and coordinating care related to the above assessment and plan.  Loistine Chance, MD  Please contact  Palliative Medicine Team phone at [phone] for questions and concerns.

## 2019-07-15 NOTE — Progress Notes (Signed)
PROGRESS NOTE  [name] [name]:[id number] DOB: 1924/12/13 DOA: 07/10/2019 PCP: [name] Cruel, MD  HPI/Recap of past 24 hours: HPI from Dr [name] [name] [name] [name] is a 85 y.o. male with a known history of Afib with PPM, CKD3, COPD,  presents to the emergency department for evaluation of weakness, SOB, LE edema.  Patient was in a usual state of health until about 10 days ago when he started to become progressively weaker, with worsening LE edema and now SOB.  Lives at home with his wife who is 38, was ambulating with a walker, but now needs assistance.  He did sustain a fall 3 weeks ago but recovered. In the ER he was found to be in afib with RVR, increased vascular congestion. Pt was given Lasix and started on a Cardizem drip.  Foley cath put out 300cc dark yellow urine so far. Patient has been taking medication as prescribed and there has been no recent change in medication or diet.  No recent antibiotics.  There has been no recent illness, hospitalizations, travel or sick contacts.  Patient admitted for further management.    Today, met patient in bed, awake, alert, was able to carry on a conversation.  Nurse also present in the room.  Just complains of shoulder pain, otherwise pain, worsening shortness of breath, abdominal pain, nausea/vomiting, fever/chills.  Noted to be significantly weak overall.    Assessment/Plan: Active Problems:   Atrial fibrillation with RVR (HCC)   Acute exacerbation of CHF (congestive heart failure) (HCC)   Stage 3b chronic kidney disease   Acute combined systolic and diastolic heart failure (HCC)   Hypotension   Palliative care by specialist   Goals of care, counseling/discussion   General weakness   Acute congestive heart failure (HCC)   Paroxysmal A. fib with RVR Heart rate better controlled TSH WNL Status post amiodarone drip, switch to p.o. amiodarone for rhythm control Cardiology on board, continue Eliquis, Toprol.  May need  cardioversion (likely Tuesday) Telemetry  New onset acute systolic HF/moderate aortic stenosis/moderate aortic regurgitation Patient presents with shortness of breath, BLE edema BNP elevated 1038 Likely cardiomyopathy due to A. fib as his pacemaker was interrogated and noted to have about 78% burden A. fib, been consistently in A. fib since May 22, 2019 Troponin mildly elevated with a flat trend, 68-73 Chest x-ray suggestive of vascular congestion Echo showed EF of 15 to 20%, global hypokinesis, moderately elevated pulmonary artery systolic pressure, 68.3 mmhg, moderate to severe mitral valve regurgitation Cardiology consulted, appreciate recs Continue Lasix- changed to PO due to rise in Cr Strict I's and O's, daily weights  Elevated troponin Chest pain-free Likely 2/2 demand ischemia from above Troponin mildly elevated with a flat trend, 68-73 EKG shows A. Fib Cardiology consulted, telemetry  AKI on CKD stage 3B Cr rising  Reduced dose of diuresis and changed to oral Continue diuresis Daily BMP  Generalized weakness/debility/fall Likely 2/2 HF CT head unremarkable PT/OT- rec SNF  Elevated D-dimer D-dimer 1.6 We will hold off on CTA chest for now due to CKD, lower extremity Doppler negative for DVT Patient already on Eliquis, continue  Bilateral lower extremity edema/wound Afebrile, with improving leukocytosis Lower extremity Doppler negative for DVT Wound care consult, recs appreciated Continue doxycycline  History of COPD Continue nebs as needed, Flonase, prednisone  History of GERD Continue Protonix        Malnutrition Type:  Nutrition Problem: Increased nutrient needs Etiology: acute illness   Malnutrition Characteristics:  Signs/Symptoms: estimated needs  Nutrition Interventions:  Interventions: Ensure Enlive (each supplement provides 350kcal and 20 grams of protein), MVI    Estimated body mass index is 22.17 kg/m as calculated from the  following:   Height as of this encounter: _0  (1.778 m).   Weight as of this encounter: 70.1 kg.     Code Status: DNR  Family Communication: Discussed with son who is a physician on 07/14/2019  Disposition Plan: Status is: Inpatient  Remains inpatient appropriate because:Inpatient level of care appropriate due to severity of illness   Dispo: The patient is from: Home              Anticipated d/c is to: SNF              Anticipated d/c date is: 2 days              Patient currently is not medically stable to d/c.  New onset HF, possible cardioversion    Consultants:  Cardiology  Palliative   Procedures:  None  Antimicrobials:  Doxycycline  DVT prophylaxis: Apixaban   Objective: Vitals:   07/15/19 1000 07/15/19 1100 07/15/19 1200 07/15/19 1300  BP: 114/64   120/61  Pulse: 77 78 80 78  Resp: _1 Temp:   97.9 F (36.6 C)   TempSrc:   Oral   SpO2: 95% 93% 94% 95%  Weight:      Height:        Intake/Output Summary (Last 24 hours) at 07/15/2019 1456 Last data filed at 07/15/2019 0900 Gross per 24 hour  Intake 663 ml  Output 1400 ml  Net -737 ml   Filed Weights   07/13/19 0419 07/14/19 0500 07/15/19 0500  Weight: 69.2 kg 70.8 kg 70.1 kg    Exam:  General: NAD, chronically ill-appearing, frail, HOH, noted chest/BUE bruising   Cardiovascular: S1, S2 present  Respiratory:  Bibasilar crackles noted  Abdomen: Soft, nontender, nondistended, bowel sounds present  Musculoskeletal: 1+ bilateral pedal edema noted  Skin:  As mentioned above  Psychiatry: Normal mood   Data Reviewed: CBC: Recent Labs  Lab 07/10/19 1620 07/12/19 0258 07/13/19 0255 07/14/19 0250 07/15/19 0303  WBC 11.6* 11.5* 10.4 7.8 10.3  NEUTROABS 10.8* 10.3* 9.3* 6.6 9.0*  HGB 12.5* 12.7* 12.2* 12.1* 13.1  HCT 41.7 40.8 40.1 40.7 43.1  MCV 112.7* 110.3* 112.0* 111.8* 109.4*  PLT 130* 115* 126* 125* 572*   Basic Metabolic Panel: Recent Labs  Lab 07/10/19 1620  07/11/19 0001 07/11/19 0537 07/12/19 0258 07/13/19 0255 07/14/19 0250 07/15/19 0303  NA   < >  --  141 142 137 135 140  K   < >  --  3.8 4.0 4.0 3.8 3.8  CL   < >  --  102 102 103 98 99  CO2   < >  --  _2 GLUCOSE   < >  --  106* 105* 109* 100* 95  BUN   < >  --  47* 45* 48* 51* 60*  CREATININE   < >  --  1.34* 1.28* 1.35* 1.31* 1.50*  CALCIUM   < >  --  8.7* 8.5* 8.3* 8.2* 8.4*  MG  --  2.2  --   --   --   --   --    < > = values in this interval not displayed.   GFR: Estimated Creatinine Clearance: 30.5 mL/min (A) (by C-G formula based on SCr of 1.5 mg/dL (  H)). Liver Function Tests: Recent Labs  Lab 07/10/19 1620  AST 38  ALT 30  ALKPHOS 81  BILITOT 1.9*  PROT 5.7*  ALBUMIN 3.4*   No results for input(s): LIPASE, AMYLASE in the last 168 hours. No results for input(s): AMMONIA in the last 168 hours. Coagulation Profile: No results for input(s): INR, PROTIME in the last 168 hours. Cardiac Enzymes: No results for input(s): CKTOTAL, CKMB, CKMBINDEX, TROPONINI in the last 168 hours. BNP (last 3 results) No results for input(s): PROBNP in the last 8760 hours. HbA1C: No results for input(s): HGBA1C in the last 72 hours. CBG: No results for input(s): GLUCAP in the last 168 hours. Lipid Profile: No results for input(s): CHOL, HDL, LDLCALC, TRIG, CHOLHDL, LDLDIRECT in the last 72 hours. Thyroid Function Tests: No results for input(s): TSH, T4TOTAL, FREET4, T3FREE, THYROIDAB in the last 72 hours. Anemia Panel: No results for input(s): VITAMINB12, FOLATE, FERRITIN, TIBC, IRON, RETICCTPCT in the last 72 hours. Urine analysis:    Component Value Date/Time   COLORURINE AMBER (A) 07/10/2019 1635   APPEARANCEUR CLEAR 07/10/2019 1635   LABSPEC 1.027 07/10/2019 1635   PHURINE 5.0 07/10/2019 1635   GLUCOSEU NEGATIVE 07/10/2019 1635   HGBUR NEGATIVE 07/10/2019 1635   BILIRUBINUR NEGATIVE 07/10/2019 1635   KETONESUR NEGATIVE 07/10/2019 1635   PROTEINUR 100 (A)  07/10/2019 1635   UROBILINOGEN 1.0 08/06/2009 1001   NITRITE NEGATIVE 07/10/2019 1635   LEUKOCYTESUR NEGATIVE 07/10/2019 1635   Sepsis Labs: _0 (procalcitonin:4,lacticidven:4)  ) Recent Results (from the past 240 hour(s))  Urine culture     Status: Abnormal   Collection Time: 07/10/19  4:35 PM   Specimen: Urine, Clean Catch  Result Value Ref Range Status   Specimen Description   Final    URINE, CLEAN CATCH Performed at [facility], [place] [address], [place], [place] [address]    Special Requests   Final    NONE Performed at [facility], [place] [address] [place], [place] [address]    Culture MULTIPLE SPECIES PRESENT, SUGGEST RECOLLECTION (A)  Final   Report Status 07/12/2019 FINAL  Final  SARS Coronavirus 2 by RT PCR (hospital order, performed in [facility] hospital lab) Nasopharyngeal Nasopharyngeal Swab     Status: None   Collection Time: 07/10/19  7:44 PM   Specimen: Nasopharyngeal Swab  Result Value Ref Range Status   SARS Coronavirus 2 NEGATIVE NEGATIVE Final    Comment: (NOTE) SARS-CoV-2 target nucleic acids are NOT DETECTED. The SARS-CoV-2 RNA is generally detectable in upper and lower respiratory specimens during the acute phase of infection. The lowest concentration of SARS-CoV-2 viral copies this assay can detect is 250 copies / mL. A negative result does not preclude SARS-CoV-2 infection and should not be used as the sole basis for treatment or other patient management decisions.  A negative result may occur with improper specimen collection / handling, submission of specimen other than nasopharyngeal swab, presence of viral mutation(s) within the areas targeted by this assay, and inadequate number of viral copies (<250 copies / mL). A negative result must be combined with clinical observations, patient history, and epidemiological information. Fact Sheet for Patients:   StrictlyIdeas.no Fact  Sheet for Healthcare Providers: BankingDealers.co.za This test is not yet approved or cleared  by the [place] FDA and has been authorized for detection and/or diagnosis of SARS-CoV-2 by FDA under an Emergency Use Authorization (EUA).  This EUA will remain in effect (meaning this test can be used) for the duration  of the COVID-19 declaration under Section 564(b)(1) of the Act, 21 U.S.C. section 360bbb-3(b)(1), unless the authorization is terminated or revoked sooner. Performed at [facility], [place] [address], [place], [place] [address]   MRSA PCR Screening     Status: Abnormal   Collection Time: 07/11/19  7:43 PM   Specimen: Nasopharyngeal  Result Value Ref Range Status   MRSA by PCR POSITIVE (A) NEGATIVE Final    Comment:        The GeneXpert MRSA Assay (FDA approved for NASAL specimens only), is one component of a comprehensive MRSA colonization surveillance program. It is not intended to diagnose MRSA infection nor to guide or monitor treatment for MRSA infections. RESULT CALLED TO, READ BACK BY AND VERIFIED WITH: S.NESMITH AT 2220 ON 07/11/19 BY N.THOMPSON Performed at [facility], [place] [address] [place], [place] [address]       Studies: No results found.  Scheduled Meds: . [START ON 07/18/2019] amiodarone  200 mg Oral Daily  . amiodarone  400 mg Oral BID  . apixaban  2.5 mg Oral BID  . capsaicin   Topical BID  . Chlorhexidine Gluconate Cloth  6 each Topical Q0600  . cholecalciferol  2,000 Units Oral Daily  . doxycycline  100 mg Oral Q12H  . feeding supplement (ENSURE ENLIVE)  237 mL Oral Q24H  . ferrous sulfate  650 mg Oral Q breakfast  . furosemide  40 mg Oral Daily  . metoprolol succinate  50 mg Oral Daily  . mirabegron ER  50 mg Oral Daily  . mirtazapine  7.5 mg Oral QHS  . multivitamin with minerals  1 tablet Oral Daily  . mupirocin ointment  1 application Nasal BID  . pantoprazole  40 mg Oral  QAC breakfast  . predniSONE  10 mg Oral Q breakfast  . sodium chloride flush  3 mL Intravenous Q12H  . umeclidinium-vilanterol  1 puff Inhalation Daily  . vitamin B-12  500 mcg Oral Q M,W,F    Continuous Infusions: . sodium chloride       LOS: 5 days     Alma Friendly, MD [facility]  If 7PM-7AM, please contact night-coverage [email or web address] 07/15/2019, 2:56 PM

## 2019-07-16 LAB — CBC WITH DIFFERENTIAL/PLATELET
Abs Immature Granulocytes: 0.03 10*3/uL (ref 0.00–0.07)
Basophils Absolute: 0 10*3/uL (ref 0.0–0.1)
Basophils Relative: 0 %
Eosinophils Absolute: 0 10*3/uL (ref 0.0–0.5)
Eosinophils Relative: 0 %
HCT: 39 % (ref 39.0–52.0)
Hemoglobin: 11.9 g/dL — ABNORMAL LOW (ref 13.0–17.0)
Immature Granulocytes: 0 %
Lymphocytes Relative: 5 %
Lymphs Abs: 0.4 10*3/uL — ABNORMAL LOW (ref 0.7–4.0)
MCH: 33.9 pg (ref 26.0–34.0)
MCHC: 30.5 g/dL (ref 30.0–36.0)
MCV: 111.1 fL — ABNORMAL HIGH (ref 80.0–100.0)
Monocytes Absolute: 0.3 10*3/uL (ref 0.1–1.0)
Monocytes Relative: 4 %
Neutro Abs: 8.5 10*3/uL — ABNORMAL HIGH (ref 1.7–7.7)
Neutrophils Relative %: 91 %
Platelets: 115 10*3/uL — ABNORMAL LOW (ref 150–400)
RBC: 3.51 MIL/uL — ABNORMAL LOW (ref 4.22–5.81)
RDW: 15 % (ref 11.5–15.5)
WBC: 9.3 10*3/uL (ref 4.0–10.5)
nRBC: 0 % (ref 0.0–0.2)

## 2019-07-16 LAB — BASIC METABOLIC PANEL
Anion gap: 11 (ref 5–15)
BUN: 54 mg/dL — ABNORMAL HIGH (ref 8–23)
CO2: 28 mmol/L (ref 22–32)
Calcium: 8.2 mg/dL — ABNORMAL LOW (ref 8.9–10.3)
Chloride: 101 mmol/L (ref 98–111)
Creatinine, Ser: 1.33 mg/dL — ABNORMAL HIGH (ref 0.61–1.24)
GFR calc Af Amer: 53 mL/min — ABNORMAL LOW (ref >60)
GFR calc non Af Amer: 46 mL/min — ABNORMAL LOW (ref >60)
Glucose, Bld: 108 mg/dL — ABNORMAL HIGH (ref 70–99)
Potassium: 3.7 mmol/L (ref 3.5–5.1)
Sodium: 140 mmol/L (ref 135–145)

## 2019-07-16 MED ORDER — HALOPERIDOL 0.5 MG PO TABS
0.5000 mg | ORAL_TABLET | ORAL | Status: DC | PRN
  Filled 2019-07-16: qty 1

## 2019-07-16 MED ORDER — HALOPERIDOL LACTATE 2 MG/ML PO CONC
0.5000 mg | ORAL | Status: DC | PRN
  Filled 2019-07-16: qty 0.3

## 2019-07-16 MED ORDER — LORAZEPAM 2 MG/ML IJ SOLN: 1.0000 mg | INTRAMUSCULAR | Status: DC | PRN

## 2019-07-16 MED ORDER — GLYCOPYRROLATE 1 MG PO TABS
1.0000 mg | ORAL_TABLET | ORAL | Status: DC | PRN
  Filled 2019-07-16: qty 1

## 2019-07-16 MED ORDER — BIOTENE DRY MOUTH MT LIQD: 15.0000 mL | OROMUCOSAL | Status: DC | PRN

## 2019-07-16 MED ORDER — LORAZEPAM 2 MG/ML PO CONC: 1.0000 mg | ORAL | Status: DC | PRN

## 2019-07-16 MED ORDER — BISACODYL 10 MG RE SUPP: 10.0000 mg | Freq: Every day | RECTAL | Status: DC | PRN

## 2019-07-16 MED ORDER — GLYCOPYRROLATE 0.2 MG/ML IJ SOLN: 0.2000 mg | INTRAMUSCULAR | Status: DC | PRN

## 2019-07-16 MED ORDER — ACETAMINOPHEN 325 MG PO TABS
650.0000 mg | ORAL_TABLET | Freq: Four times a day (QID) | ORAL | Status: DC | PRN
  Administered 2019-07-17 – 2019-07-20 (×6): 650 mg via ORAL
  Filled 2019-07-16 (×9): qty 2

## 2019-07-16 MED ORDER — LORAZEPAM 1 MG PO TABS: 1.0000 mg | ORAL_TABLET | ORAL | Status: DC | PRN

## 2019-07-16 MED ORDER — DIPHENHYDRAMINE HCL 50 MG/ML IJ SOLN: 12.5000 mg | INTRAMUSCULAR | Status: DC | PRN

## 2019-07-16 MED ORDER — HYDROMORPHONE HCL 1 MG/ML IJ SOLN
0.5000 mg | INTRAMUSCULAR | Status: DC | PRN
  Filled 2019-07-16: qty 0.5

## 2019-07-16 MED ORDER — POLYVINYL ALCOHOL 1.4 % OP SOLN
1.0000 [drp] | Freq: Four times a day (QID) | OPHTHALMIC | Status: DC | PRN
  Filled 2019-07-16: qty 15

## 2019-07-16 MED ORDER — ACETAMINOPHEN 650 MG RE SUPP: 650.0000 mg | Freq: Four times a day (QID) | RECTAL | Status: DC | PRN

## 2019-07-16 MED ORDER — HALOPERIDOL LACTATE 5 MG/ML IJ SOLN: 0.5000 mg | INTRAMUSCULAR | Status: DC | PRN

## 2019-07-16 NOTE — Progress Notes (Signed)
Physical Therapy Treatment Patient Details Name: [name] [name] MRN: [id number] DOB: Oct 26, 1924 Today's Date: 07/16/2019    History of Present Illness [name] [name] is a 86 y.o. male with a known history of Afib with PPM, CKD3, COPD,  presents to the emergency department 07/11/19  for evaluation of weakness, SOB, LE edema, falls.    PT Comments    Patient making progress with therapy and able to sit up EOB today. He was seen this AM and was agreeable to participate. Patient required mod assist for rolling and Max assist +2 for supine to sit transfer in bed. He required Max assist at EOB to steady and is very fearful of falling with heavy posterior lean. He required total assist to return to supine and reposition in bed. Of note pt's palliative care MD has placed a discontinue order for PT services. Will discontinue at this time. Please re-consult if there is a change in status or pt's POC.   Follow Up Recommendations  SNF;Supervision/Assistance - 24 hour     Equipment Recommendations  None recommended by PT    Recommendations for Other Services       Precautions / Restrictions Precautions Precautions: Fall Precaution Comments: has had several. EF 10-15% Restrictions Weight Bearing Restrictions: No    Mobility  Bed Mobility Overal bed mobility: Needs Assistance Bed Mobility: Sit to Supine;Rolling;Sidelying to Sit Rolling: Mod assist Sidelying to sit: Mod assist;+2 for safety/equipment;+2 for physical assistance   Sit to supine: Max assist;+2 for safety/equipment;+2 for physical assistance   General bed mobility comments: cues for reaching to bed rail to initiate rolling to Rt side. Mod assist to roll lower trunk. Mod assist +2 to raise trunk up and bring LE's off EOB.   Transfers Overall transfer level: Needs assistance Equipment used: Rolling walker (2 wheeled) Transfers: Sit to/from Stand Sit to Stand: From elevated surface;Total assist;+2 safety/equipment;+2 physical  assistance         General transfer comment: Attempt for sit<>stand from EOB with RW and with Denna Haggard. Pt unable to achieve rise and clear bed. Pt with great difficulty following commands for foot placement on ground and on stedy.  Ambulation/Gait          Stairs        Wheelchair Mobility    Modified Rankin (Stroke Patients Only)       Balance Overall balance assessment: Needs assistance Sitting-balance support: Feet supported;Bilateral upper extremity supported Sitting balance-Leahy Scale: Poor Sitting balance - Comments: pt with posterior lean sitting EOB. pt required assist to steady at EOB as he continued to slide feet forward on floor requiring mod-max assist to prevent anterior slide. pt able to perform LE marching at EOB with mod-max assist to stabilize and cues for hand placement on bed rail to improve balance. Postural control: Posterior lean           Cognition Arousal/Alertness: Awake/alert Behavior During Therapy: WFL for tasks assessed/performed Overall Cognitive Status: No family/caregiver present to determine baseline cognitive functioning               Exercises      General Comments        Pertinent Vitals/Pain Pain Assessment: Faces Faces Pain Scale: Hurts little more Pain Location: left shoulder Pain Descriptors / Indicators: Discomfort;Grimacing Pain Intervention(s): Limited activity within patient's tolerance;Monitored during session;Repositioned(cream applied by RN to Lt shoulder)           PT Goals (current goals can now be found in the care plan section)  Acute Rehab PT Goals PT Goal Formulation: With patient Time For Goal Achievement: 07/26/19 Potential to Achieve Goals: Fair Progress towards PT goals: Progressing toward goals    Frequency    Min 2X/week      PT Plan Current plan remains appropriate    Co-evaluation              AM-PAC PT "6 Clicks" Mobility   Outcome Measure  Help needed turning  from your back to your side while in a flat bed without using bedrails?: A Lot Help needed moving from lying on your back to sitting on the side of a flat bed without using bedrails?: Total Help needed moving to and from a bed to a chair (including a wheelchair)?: Total Help needed standing up from a chair using your arms (e.g., wheelchair or bedside chair)?: Total Help needed to walk in hospital room?: Total Help needed climbing 3-5 steps with a railing? : Total 6 Click Score: 7    End of Session Equipment Utilized During Treatment: Gait belt Activity Tolerance: Patient limited by fatigue Patient left: in bed;with call bell/phone within reach;with bed alarm set Nurse Communication: Mobility status PT Visit Diagnosis: Unsteadiness on feet (R26.81);Difficulty in walking, not elsewhere classified (R26.2);Pain;Repeated falls (R29.6) Pain - Right/Left: Left Pain - part of body: Shoulder     Time: [id number] PT Time Calculation (min) (ACUTE ONLY): 41 min  Charges:  $Therapeutic Activity: 38-52 mins                     Verner Mould, DPT Physical Therapist with [facility] [phone]  07/16/2019 1:13 PM

## 2019-07-16 NOTE — Progress Notes (Signed)
Daily Progress Note   Patient Name: [name] [name]       Date: 07/16/2019 DOB: 1925/02/11  Age: 86 y.o. MRN#: [id number] Attending Physician: Alma Friendly, MD Primary Care Physician: Lawerance Cruel, MD Admit Date: 07/10/2019  Reason for Consultation/Follow-up: Establishing goals of care  Subjective: "I want to consider hospice, I think I am ready for hospice."  Wife and son at bedside, a family meeting was held, see below.     Length of Stay: 6  Current Medications: Scheduled Meds:  . [START ON 07/18/2019] amiodarone  200 mg Oral Daily  . amiodarone  400 mg Oral BID  . apixaban  2.5 mg Oral BID  . capsaicin   Topical BID  . chlorhexidine  15 mL Mouth Rinse BID  . Chlorhexidine Gluconate Cloth  6 each Topical Q0600  . cholecalciferol  2,000 Units Oral Daily  . doxycycline  100 mg Oral Q12H  . feeding supplement (ENSURE ENLIVE)  237 mL Oral Q24H  . ferrous sulfate  650 mg Oral Q breakfast  . furosemide  40 mg Oral Daily  . mouth rinse  15 mL Mouth Rinse q12n4p  . metoprolol succinate  50 mg Oral Daily  . mirabegron ER  50 mg Oral Daily  . mirtazapine  7.5 mg Oral QHS  . pantoprazole  40 mg Oral QAC breakfast  . predniSONE  10 mg Oral Q breakfast  . sodium chloride flush  3 mL Intravenous Q12H  . umeclidinium-vilanterol  1 puff Inhalation Daily  . vitamin B-12  500 mcg Oral Q M,W,F    Continuous Infusions: . sodium chloride      PRN Meds: sodium chloride, acetaminophen, albuterol, fluticasone, ondansetron (ZOFRAN) IV, oxyCODONE, psyllium, sodium chloride flush  Physical Exam         Frail-appearing elderly gentleman resting in bed Awake more alert this morning.    Patient has weeping lesions bilateral lower extremities S1-S2 irregular, heart rate is in the  70s Abdomen is not distended  Vital Signs: BP (!) 123/107   Pulse 81   Temp (!) 97.5 F (36.4 C) (Oral)   Resp (!) 23   Ht 5\' 10"  (1.778 m)   Wt 65.3 kg   SpO2 (!) 68%   BMI 20.66 kg/m  SpO2: SpO2: (!) 68 % O2 Device: O2 Device:  Room Air O2 Flow Rate: O2 Flow Rate (L/min): 3 L/min  Intake/output summary:   Intake/Output Summary (Last 24 hours) at 07/16/2019 1304 Last data filed at 07/16/2019 0600 Gross per 24 hour  Intake 400 ml  Output 950 ml  Net -550 ml   LBM: Last BM Date: 07/14/19 Baseline Weight: Weight: 69.2 kg Most recent weight: Weight: 65.3 kg       Palliative Assessment/Data:      Patient Active Problem List   Diagnosis Date Noted  . Acute congestive heart failure ([place])   . Palliative care by specialist   . Goals of care, counseling/discussion   . General weakness   . Stage 3b chronic kidney disease   . Acute combined systolic and diastolic heart failure ([place])   . Hypotension   . Acute exacerbation of CHF (congestive heart failure) ([place]) 07/10/2019  . History of cardioversion 04/29/2019  . Sacral fracture, closed ([place]) 04/13/2018  . PCO (posterior capsular opacification), bilateral 05/26/2017  . Weakness of right leg 04/20/2017  . 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 Exudative age-related macular degeneration of right eye with active choroidal neovascularization ([place]) 09/30/2016  . Pseudophakia of both eyes 09/30/2016  . Impingement syndrome of right shoulder 06/16/2016  . Impingement syndrome of left shoulder 06/16/2016  . Bilateral leg edema 06/14/2016  . Dizziness and giddiness 06/26/2015  . Abnormality of gait 06/26/2015  . Anxiety 04/14/2015  . GERD (gastroesophageal reflux disease) 04/03/2015  . OSA (obstructive sleep apnea) 04/03/2015  . HLD (hyperlipidemia) 04/03/2015  . SOB (shortness of breath)   . Left elbow fracture 03/29/2015  . Dizziness 01/01/2015  . LBP (low back pain) 01/01/2015    . Malignant neoplasm of prostate ([place]) 01/01/2015  . BP (high blood pressure) 01/01/2015  . DOE (dyspnea on exertion) 10/26/2013  . Essential hypertension, benign 10/26/2013  . Post-traumatic wound infection 10/17/2012  . Cellulitis 10/17/2012  . Atrial fibrillation with RVR ([place]) 10/22/2011  . DVT, lower extremity, distal, chronic ([place]) 10/22/2011  . Pacemaker 10/19/2011  . Prostate cancer ([place]) 10/19/2011  . COPD (chronic obstructive pulmonary disease) with emphysema ([place]) 02/04/2011  . Anemia 12/10/2010    Palliative Care Assessment & Plan   Patient Profile:    Assessment: 86 year old gentleman with sick sinus syndrome status post pacemaker, persistent atrial fibrillation, stage III CKD, COPD, history of prior DVT, history of hypertension.  Patient admitted to the hospital with acute on chronic systolic and diastolic heart failure and atrial fibrillation with rapid ventricular response.  Found to have left ventricular ejection fraction of 15-20% on surface echocardiogram.  Patient was started on diuretics as well as rate control medications.  Cardiology was consulted.  Palliative consultation for broad goals of care discussions.  Recommendations/Plan:   Goals of care discussions were undertaken in a  family meeting with patient's wife and son Dr. Tressia Miners initially at the patient's bedside and then separately in conference room.   Goals wishes and values important to the patient and family were reviewed. Patient has expresses a preference for not undergoing cardioversion, he wishes to go to a hospice facility. Patient and family have had discussions amongst themselves and would like to focus on symptom management measures, comfort focused mode of care and transfer to residential hospice. They would not want CV attempt, SNF rehab. Patient has ongoing functional decline, PO intake was been minimal at times, at times, patient still able to take Ensure PO.   We discussed about  transfer  out of step down unit to a non tele medical floor.   D/C routine labs and focus on dignity preservation and focus on comfort. Also discussed about not putting in a PICC line. Patient still able to take some PO medications and liquids. Discussed with family that hospice facility used PO and sublingual routes for symptom management.   Will notify TRH MD.   Plan: DNR DNI No cardioversion Comfort measures Transfer to non tele bed Continue current prn symptom management medications. Transfer to residential hospice.  Prognosis could be 2 weeks or less, patient with markedly low EF, functional decline, A fib RVR until recently, remains at high risk for sudden decompensation and death from arrhythmia.         Code Status:    Code Status Orders  (From admission, onward)         Start     Ordered   07/11/19 0853  Do not attempt resuscitation (DNR)  Continuous    Question Answer Comment  In the event of cardiac or respiratory ARREST Do not call a "code blue"   In the event of cardiac or respiratory ARREST Do not perform Intubation, CPR, defibrillation or ACLS   In the event of cardiac or respiratory ARREST Use medication by any route, position, wound care, and other measures to relive pain and suffering. May use oxygen, suction and manual treatment of airway obstruction as needed for comfort.      07/11/19 0852        Code Status History    Date Active Date Inactive Code Status Order ID Comments User Context   07/11/2019 0000 07/11/2019 0852 Full Code [id number]  Harvie Bridge, DO ED   07/11/2019 0000 07/11/2019 0000 DNR [id number]  Harvie Bridge, DO ED   02/10/2018 1506 02/10/2018 1916 Full Code [id number]  Deboraha Sprang, MD Inpatient   10/17/2012 1527 10/24/2012 2207 DNR [id number]  Domenic Polite, MD Inpatient   10/19/2011 2325 10/25/2011 2356 Full Code [id number]  Ethel Rana, RN Inpatient   Advance Care Planning Activity       Prognosis:  Less than 2 weeks.     Discharge Planning:  Residential hospice.   Care plan was discussed with patient,patient's wife and son  and bedside staff.       Thank you for allowing the Palliative Medicine Team to assist in the care of this patient.   Time In: 12.30 Time Out: 1305 Total Time 35 Prolonged Time Billed  no       Greater than 50%  of this time was spent counseling and coordinating care related to the above assessment and plan.  Loistine Chance, MD  Please contact Palliative Medicine Team phone at [phone] for questions and concerns.

## 2019-07-16 NOTE — Progress Notes (Signed)
PROGRESS NOTE  JKWON [name] [name] [id number] DOB: 1924-06-06 DOA: 07/10/2019 PCP: Lawerance Cruel, MD  HPI/Recap of past 24 hours: HPI from Dr Ara Kussmaul [name] [name] is a 86 y.o. male with a known history of Afib with PPM, CKD3, COPD,  presents to the emergency department for evaluation of weakness, SOB, LE edema.  Patient was in a usual state of health until about 10 days ago when he started to become progressively weaker, with worsening LE edema and now SOB.  Lives at home with his wife who is 34, was ambulating with a walker, but now needs assistance.  He did sustain a fall 3 weeks ago but recovered. In the ER he was found to be in afib with RVR, increased vascular congestion. Pt was given Lasix and started on a Cardizem drip.  Foley cath put out 300cc dark yellow urine so far. Patient has been taking medication as prescribed and there has been no recent change in medication or diet.  No recent antibiotics.  There has been no recent illness, hospitalizations, travel or sick contacts.  Patient admitted for further management.    Today, patient appeared much weaker, easily arousable.  Patient and family have decided to move to comfort care with hospice services.    Assessment/Plan: Active Problems:   Atrial fibrillation with RVR (HCC)   Acute exacerbation of CHF (congestive heart failure) (HCC)   Stage 3b chronic kidney disease   Acute combined systolic and diastolic heart failure (HCC)   Hypotension   Palliative care by specialist   Goals of care, counseling/discussion   General weakness   Acute congestive heart failure (HCC)   Paroxysmal A. fib with RVR Heart rate better controlled TSH WNL Status post amiodarone drip, switch to p.o. amiodarone for rhythm control Eliquis Cardiology on board, planned for cardioversion, now comfort care  New onset acute systolic HF/moderate aortic stenosis/moderate aortic regurgitation/elevated troponin BNP elevated 1038 Likely  cardiomyopathy due to A. fib as his pacemaker was interrogated and noted to have about 78% burden A. fib, been consistently in A. fib since May 22, 2019 Troponin mildly elevated with a flat trend, 68-73 Chest x-ray suggestive of vascular congestion Echo showed EF of 15 to 20%, global hypokinesis, moderately elevated pulmonary artery systolic pressure, [id number] mmhg, moderate to severe mitral valve regurgitation Cardiology consulted, appreciate recs, on lasix, currently comfort care  AKI on CKD stage 3B Cr fluctuating   Continue diuresis for now, currently comfort care  Generalized weakness/debility/fall Comfort care- Pending residential hospice  Elevated D-dimer D-dimer 1.6 Lower extremity Doppler negative for DVT  Bilateral lower extremity edema/wound Afebrile, with improving leukocytosis Lower extremity Doppler negative for DVT Wound care consult, recs appreciated Continue doxycycline for a total of 5 days  History of COPD Continue nebs as needed, Flonase, prednisone  History of GERD Continue Protonix        Malnutrition Type:  Nutrition Problem: Increased nutrient needs Etiology: acute illness   Malnutrition Characteristics:  Signs/Symptoms: estimated needs   Nutrition Interventions:  Interventions: Ensure Enlive (each supplement provides 350kcal and 20 grams of protein), MVI    Estimated body mass index is 20.66 kg/m as calculated from the following:   Height as of this encounter: 5\' 10"  (1.778 m).   Weight as of this encounter: 65.3 kg.     Code Status: DNR  Family Communication: Discussed with son who is a physician on 07/15/2019  Disposition Plan: Status is: Inpatient  Remains inpatient appropriate because:Inpatient level of care appropriate due to  severity of illness   Dispo: The patient is from: Home              Anticipated d/c is to: Residential hospice              Anticipated d/c date is: 1 day              Patient currently is medically  stable to d/c. to residential hospice    Consultants:  Cardiology  Palliative   Procedures:  None  Antimicrobials:  Doxycycline  DVT prophylaxis: Apixaban   Objective: Vitals:   07/16/19 1000 07/16/19 1100 07/16/19 1200 07/16/19 1300  BP: (!) 123/107  (!) 94/56 105/74  Pulse: 81  72 74  Resp: 17 (!) 23 16 (!) 23  Temp:   97.6 F (36.4 C)   TempSrc:   Axillary   SpO2: (!) 68%  96% 98%  Weight:      Height:        Intake/Output Summary (Last 24 hours) at 07/16/2019 1534 Last data filed at 07/16/2019 0600 Gross per 24 hour  Intake 400 ml  Output 950 ml  Net -550 ml   Filed Weights   07/14/19 0500 07/15/19 0500 07/16/19 0500  Weight: 70.8 kg 70.1 kg 65.3 kg    Exam:  General: NAD, chronically ill-appearing, frail, HOH, noted chest/BUE bruising   Cardiovascular: S1, S2 present  Respiratory:  Bibasilar crackles noted  Abdomen: Soft, nontender, nondistended, bowel sounds present  Musculoskeletal: 1+ bilateral pedal edema noted  Skin:  As mentioned above  Psychiatry: Fair mood   Data Reviewed: CBC: Recent Labs  Lab 07/12/19 0258 07/13/19 0255 07/14/19 0250 07/15/19 0303 07/16/19 0234  WBC 11.5* 10.4 7.8 10.3 9.3  NEUTROABS 10.3* 9.3* 6.6 9.0* 8.5*  HGB 12.7* 12.2* 12.1* 13.1 11.9*  HCT 40.8 40.1 40.7 43.1 39.0  MCV 110.3* 112.0* 111.8* 109.4* 111.1*  PLT 115* 126* 125* 136* [id number]   Basic Metabolic Panel: Recent Labs  Lab 07/11/19 0001 07/11/19 0537 07/12/19 0258 07/13/19 0255 07/14/19 0250 07/15/19 0303 07/16/19 0234  NA  --    < > 142 137 135 140 140  K  --    < > 4.0 4.0 3.8 3.8 3.7  CL  --    < > 102 103 98 99 101  CO2  --    < > 25 24 26 30 28   GLUCOSE  --    < > 105* 109* 100* 95 108*  BUN  --    < > 45* 48* 51* 60* 54*  CREATININE  --    < > 1.28* 1.35* 1.31* 1.50* 1.33*  CALCIUM  --    < > 8.5* 8.3* 8.2* 8.4* 8.2*  MG 2.2  --   --   --   --   --   --    < > = values in this interval not displayed.   GFR: Estimated  Creatinine Clearance: 32 mL/min (A) (by C-G formula based on SCr of 1.33 mg/dL (H)). Liver Function Tests: Recent Labs  Lab 07/10/19 1620  AST 38  ALT 30  ALKPHOS 81  BILITOT 1.9*  PROT 5.7*  ALBUMIN 3.4*   No results for input(s): LIPASE, AMYLASE in the last 168 hours. No results for input(s): AMMONIA in the last 168 hours. Coagulation Profile: No results for input(s): INR, PROTIME in the last 168 hours. Cardiac Enzymes: No results for input(s): CKTOTAL, CKMB, CKMBINDEX, TROPONINI in the last 168 hours. BNP (last 3 results) No results  for input(s): PROBNP in the last 8760 hours. HbA1C: No results for input(s): HGBA1C in the last 72 hours. CBG: No results for input(s): GLUCAP in the last 168 hours. Lipid Profile: No results for input(s): CHOL, HDL, LDLCALC, TRIG, CHOLHDL, LDLDIRECT in the last 72 hours. Thyroid Function Tests: No results for input(s): TSH, T4TOTAL, FREET4, T3FREE, THYROIDAB in the last 72 hours. Anemia Panel: No results for input(s): VITAMINB12, FOLATE, FERRITIN, TIBC, IRON, RETICCTPCT in the last 72 hours. Urine analysis:    Component Value Date/Time   COLORURINE AMBER (A) 07/10/2019 1635   APPEARANCEUR CLEAR 07/10/2019 1635   LABSPEC 1.027 07/10/2019 1635   PHURINE 5.0 07/10/2019 1635   GLUCOSEU NEGATIVE 07/10/2019 1635   HGBUR NEGATIVE 07/10/2019 1635   BILIRUBINUR NEGATIVE 07/10/2019 1635   KETONESUR NEGATIVE 07/10/2019 1635   PROTEINUR 100 (A) 07/10/2019 1635   UROBILINOGEN 1.0 08/06/2009 1001   NITRITE NEGATIVE 07/10/2019 1635   LEUKOCYTESUR NEGATIVE 07/10/2019 1635   Sepsis Labs: @LABRCNTIP (procalcitonin:4,lacticidven:4)  ) Recent Results (from the past 240 hour(s))  Urine culture     Status: Abnormal   Collection Time: 07/10/19  4:35 PM   Specimen: Urine, Clean Catch  Result Value Ref Range Status   Specimen Description   Final    URINE, CLEAN CATCH Performed at [facility], [place] [address] [place], [place]  [address]    Special Requests   Final    NONE Performed at [facility], [place] [address], [place], [place] [address]    Culture MULTIPLE SPECIES PRESENT, SUGGEST RECOLLECTION (A)  Final   Report Status 07/12/2019 FINAL  Final  SARS Coronavirus 2 by RT PCR (hospital order, performed in [facility] hospital lab) Nasopharyngeal Nasopharyngeal Swab     Status: None   Collection Time: 07/10/19  7:44 PM   Specimen: Nasopharyngeal Swab  Result Value Ref Range Status   SARS Coronavirus 2 NEGATIVE NEGATIVE Final    Comment: (NOTE) SARS-CoV-2 target nucleic acids are NOT DETECTED. The SARS-CoV-2 RNA is generally detectable in upper and lower respiratory specimens during the acute phase of infection. The lowest concentration of SARS-CoV-2 viral copies this assay can detect is 250 copies / mL. A negative result does not preclude SARS-CoV-2 infection and should not be used as the sole basis for treatment or other patient management decisions.  A negative result may occur with improper specimen collection / handling, submission of specimen other than nasopharyngeal swab, presence of viral mutation(s) within the areas targeted by this assay, and inadequate number of viral copies (<250 copies / mL). A negative result must be combined with clinical observations, patient history, and epidemiological information. Fact Sheet for Patients:   StrictlyIdeas.no Fact Sheet for Healthcare Providers: BankingDealers.co.za This test is not yet approved or cleared  by the [place] FDA and has been authorized for detection and/or diagnosis of SARS-CoV-2 by FDA under an Emergency Use Authorization (EUA).  This EUA will remain in effect (meaning this test can be used) for the duration of the COVID-19 declaration under Section 564(b)(1) of the Act, 21 U.S.C. section 360bbb-3(b)(1), unless the authorization is terminated or revoked sooner. Performed  at [facility], [place] [address], [place], [place] [address]   MRSA PCR Screening     Status: Abnormal   Collection Time: 07/11/19  7:43 PM   Specimen: Nasopharyngeal  Result Value Ref Range Status   MRSA by PCR POSITIVE (A) NEGATIVE Final    Comment:        The  GeneXpert MRSA Assay (FDA approved for NASAL specimens only), is one component of a comprehensive MRSA colonization surveillance program. It is not intended to diagnose MRSA infection nor to guide or monitor treatment for MRSA infections. RESULT CALLED TO, READ BACK BY AND VERIFIED WITH: S.NESMITH AT 2220 ON 07/11/19 BY N.THOMPSON Performed at [facility], [place] [address] [place], [place] [address]       Studies: No results found.  Scheduled Meds: . [START ON 07/18/2019] amiodarone  200 mg Oral Daily  . amiodarone  400 mg Oral BID  . apixaban  2.5 mg Oral BID  . capsaicin   Topical BID  . chlorhexidine  15 mL Mouth Rinse BID  . Chlorhexidine Gluconate Cloth  6 each Topical Q0600  . cholecalciferol  2,000 Units Oral Daily  . doxycycline  100 mg Oral Q12H  . feeding supplement (ENSURE ENLIVE)  237 mL Oral Q24H  . ferrous sulfate  650 mg Oral Q breakfast  . furosemide  40 mg Oral Daily  . mouth rinse  15 mL Mouth Rinse q12n4p  . metoprolol succinate  50 mg Oral Daily  . mirabegron ER  50 mg Oral Daily  . mirtazapine  7.5 mg Oral QHS  . pantoprazole  40 mg Oral QAC breakfast  . predniSONE  10 mg Oral Q breakfast  . sodium chloride flush  3 mL Intravenous Q12H  . umeclidinium-vilanterol  1 puff Inhalation Daily  . vitamin B-12  500 mcg Oral Q M,W,F    Continuous Infusions: . sodium chloride       LOS: 6 days     Alma Friendly, MD [facility]  If 7PM-7AM, please contact night-coverage [email or web address] 07/16/2019, 3:34 PM

## 2019-07-16 NOTE — Progress Notes (Signed)
Progress Note  Patient Name: [name] [name] Date of Encounter: 07/16/2019  Primary Cardiologist: Larae Grooms, MD   Subjective   Feeling tired.  No chest pain and breathing is stable.  Denies any palpitations.  He walked yesterday and complained of feeling weak but breathing was okay.  Inpatient Medications    Scheduled Meds: . [START ON 07/18/2019] amiodarone  200 mg Oral Daily  . amiodarone  400 mg Oral BID  . apixaban  2.5 mg Oral BID  . capsaicin   Topical BID  . chlorhexidine  15 mL Mouth Rinse BID  . Chlorhexidine Gluconate Cloth  6 each Topical Q0600  . cholecalciferol  2,000 Units Oral Daily  . doxycycline  100 mg Oral Q12H  . feeding supplement (ENSURE ENLIVE)  237 mL Oral Q24H  . ferrous sulfate  650 mg Oral Q breakfast  . furosemide  40 mg Oral Daily  . mouth rinse  15 mL Mouth Rinse q12n4p  . metoprolol succinate  50 mg Oral Daily  . mirabegron ER  50 mg Oral Daily  . mirtazapine  7.5 mg Oral QHS  . multivitamin with minerals  1 tablet Oral Daily  . mupirocin ointment  1 application Nasal BID  . pantoprazole  40 mg Oral QAC breakfast  . predniSONE  10 mg Oral Q breakfast  . sodium chloride flush  3 mL Intravenous Q12H  . umeclidinium-vilanterol  1 puff Inhalation Daily  . vitamin B-12  500 mcg Oral Q M,W,F   Continuous Infusions: . sodium chloride     PRN Meds: sodium chloride, acetaminophen, albuterol, fluticasone, ondansetron (ZOFRAN) IV, oxyCODONE, psyllium, sodium chloride flush   Vital Signs    Vitals:   07/16/19 0500 07/16/19 0600 07/16/19 0700 07/16/19 0800  BP: 100/70 (!) 86/67 (!) 92/55 108/73  Pulse: 74 73  66  Resp: 17 20 14 15   Temp:    (!) 97.5 F (36.4 C)  TempSrc:    Oral  SpO2: 99% 100%  94%  Weight: 65.3 kg     Height:        Intake/Output Summary (Last 24 hours) at 07/16/2019 0921 Last data filed at 07/16/2019 0600 Gross per 24 hour  Intake 640 ml  Output 1150 ml  Net -510 ml   Last 3 Weights 07/16/2019 07/15/2019  07/14/2019  Weight (lbs) 143 lb 15.4 oz 154 lb 8.7 oz 156 lb 1.4 oz  Weight (kg) 65.3 kg 70.1 kg 70.8 kg      Telemetry    Atrial fibrillation.  Rates under 100 bpm.- Personally Reviewed  ECG    n/a - Personally Reviewed  Physical Exam   VS:  BP 108/73 (BP Location: Right Arm)   Pulse 66   Temp (!) 97.5 F (36.4 C) (Oral)   Resp 15   Ht 5\' 10"  (1.778 m)   Wt 65.3 kg   SpO2 94%   BMI 20.66 kg/m  , BMI Body mass index is 20.66 kg/m. GENERAL: Frail.  Chronically ill-appearing.  No acute distress HEENT: Pupils equal round and reactive, fundi not visualized, oral mucosa unremarkable NECK: JVP to the clavicle at 45 degrees.  Waveform within normal limits, carotid upstroke brisk and symmetric, no bruits LUNGS:  Clear to auscultation bilaterally HEART:  RRR.  PMI not displaced or sustained,S1 and S2 within normal limits, no S3, no S4, no clicks, no rubs, II/VI mid-peaking systolic murmur at the left upper sternal border  ABD:  Flat, positive bowel sounds normal in frequency in pitch,  no bruits, no rebound, no guarding, no midline pulsatile mass, no hepatomegaly, no splenomegaly EXT:  2 plus pulses throughout, 2+ lower extremity edema to the ankles, no cyanosis no clubbing SKIN: Ecchymoses NEURO:  Cranial nerves II through XII grossly intact, motor grossly intact throughout [place]:  Cognitively intact, oriented to person place and time   Labs    High Sensitivity Troponin:   Recent Labs  Lab 07/10/19 1620 07/10/19 1820  TROPONINIHS 68* 73*      Chemistry Recent Labs  Lab 07/10/19 1620 07/11/19 0537 07/14/19 0250 07/15/19 0303 07/16/19 0234  NA 142   < > 135 140 140  K 4.5   < > 3.8 3.8 3.7  CL 104   < > 98 99 101  CO2 25   < > 26 30 28   GLUCOSE 124*   < > 100* 95 108*  BUN 45*   < > 51* 60* 54*  CREATININE 1.42*   < > 1.31* 1.50* 1.33*  CALCIUM 8.7*   < > 8.2* 8.4* 8.2*  PROT 5.7*  --   --   --   --   ALBUMIN 3.4*  --   --   --   --   AST 38  --   --   --   --     ALT 30  --   --   --   --   ALKPHOS 81  --   --   --   --   BILITOT 1.9*  --   --   --   --   GFRNONAA 42*   < > 47* 40* 46*  GFRAA 49*   < > 54* 46* 53*  ANIONGAP 13   < > 11 11 11    < > = values in this interval not displayed.     Hematology Recent Labs  Lab 07/14/19 0250 07/15/19 0303 07/16/19 0234  WBC 7.8 10.3 9.3  RBC 3.64* 3.94* 3.51*  HGB 12.1* 13.1 11.9*  HCT 40.7 43.1 39.0  MCV 111.8* 109.4* 111.1*  MCH 33.2 33.2 33.9  MCHC 29.7* 30.4 30.5  RDW 14.9 14.7 15.0  PLT 125* 136* 115*    BNP Recent Labs  Lab 07/10/19 1620  BNP 1,038.0*     DDimer  Recent Labs  Lab 07/10/19 2220  DDIMER 1.60*     Radiology    No results found.  Cardiac Studies   Echo 07/11/2019:  1. Severely reduced LV function, 15-20%. LVOT VTI 8 cm. Left ventricular  ejection fraction, by estimation, is 15-20%. The left ventricle has  severely decreased function. The left ventricle demonstrates global  hypokinesis. Left ventricular diastolic  function could not be evaluated.  2. Right ventricular systolic function is moderately reduced. The right  ventricular size is moderately enlarged. There is moderately elevated  pulmonary artery systolic pressure. The estimated right ventricular  systolic pressure is [id number] mmHg.  3. Left atrial size was moderately dilated.  4. Right atrial size was moderately dilated.  5. The mitral valve is degenerative. Moderate to severe mitral valve  regurgitation. No evidence of mitral stenosis.  6. The aortic valve is tricuspid and severely calcified with restricted  movement in systole. The gradients are quite low given the severely  reduced LV function (LVOT VTI 8 cm, SV=34 cc, SVi=18 cc/m2). AVA by VTI is  1.44 cm2 and DI is 0.34, consistent  with moderate aortic stenosis. If there are concerns for severe aortic  stenosis, would recommend an aortic valve  calcium score for clarification.  The aortic valve is tricuspid. Aortic valve  regurgitation is not  visualized. Moderate to severe aortic valve  stenosis. Aortic valve area, by VTI measures 1.44 cm. Aortic valve mean  gradient measures 5.0 mmHg. Aortic valve Vmax measures 1.50 m/s.   Patient Profile     86 y.o. male with sick sinus syndrome status post pacemaker, persistent atrial fibrillation,CKD 3,COPD, prior DVT and hypertensionadmitted with acute on chronic systolic and diastolic heart failure and atrial fibrillation with rapid ventricular response.  Assessment & Plan    # Persistent atrial fibrillation with RVR:  Mr. Hinger remains in atrial fibrillation but rates are much better-controlled.  He has been in atrial fibrillation consistently since April 6, and I suspect this is the cause of his worsened systolic function.  Continue Eliquis, metoprolol, and amiodarone.  Plan for cardioversion tomorrow.  Given his renal function and weight today, he should be taking Eliquis 5mg  bid.  However renal function is labile and he has extensive ecchymoses.  It is reasonable to continue 2.5mg .   # Acute systolic and diastolic heart failure:  Likely tachycardia mediated cardiomyopathy.  Device interrogation showed rates up to the 170s at home.  He has not had any ischemic symptoms.  Therefore given his age there is no plan for an ischemic evaluation at this time.  Volume status is improving.  Continue Lasix 40 mg p.o. daily.  Continue metoprolol.  Would repeat echo in 3 months to evaluate for improvement in systolic function.  Plan for cardioversion tomorrow as above.  # Moderate AS/MR: Diuresis as above.  Mean gradient across the aortic valve is 5 mmHg.  However visually it is more severe.  Likely moderate aortic stenosis.  # Demand ischemia:   High-sensitivity troponin was elevated to 73 but relatively flat.  No ischemic symptoms.  No plan for ischemic evaluation as above.      For questions or updates, please contact [place] Please consult [email or web address] for  contact info under        Signed, Skeet Latch, MD  07/16/2019, 9:21 AM

## 2019-07-16 NOTE — Progress Notes (Signed)
Manufacturing engineer [facility])  Referral received for residential hospice at [facility].  Confirmed with son, Dr. Tressia Miners, questions answered and support provided.  Family is requesting that he be moved out of the SDU.  Messaged attending and PMT to pass the request along.  Family is aware we will update them once a bed becomes available.  Venia Carbon RN, BSN, [facility] Liaison

## 2019-07-17 MED ORDER — OXYCODONE HCL 20 MG/ML PO CONC
5.0000 mg | ORAL | Status: DC | PRN
  Filled 2019-07-17: qty 1

## 2019-07-17 MED ORDER — OXYCODONE HCL 5 MG PO TABS
5.0000 mg | ORAL_TABLET | ORAL | Status: DC | PRN
  Administered 2019-07-17 – 2019-07-24 (×16): 5 mg via ORAL
  Filled 2019-07-17 (×18): qty 1

## 2019-07-17 NOTE — Progress Notes (Signed)
[facility] ([facility] Hospital Liaison note.     [facility] does not have a bed available today.  Hospital Liaison will follow up tomorrow or sooner if a room becomes available.     A Please do not hesitate to call with questions.     Thank you,    Mary Anne Robertson, RN, CCM       [facility] Hospital Liaison (listed on AMION under Hospice /[facility])     [phone]  

## 2019-07-17 NOTE — Progress Notes (Signed)
Daily Progress Note   Patient Name: [name] [name]       Date: 07/17/2019 DOB: 10-15-1924  Age: 86 y.o. MRN#: [id number] Attending Physician: Alma Friendly, MD Primary Care Physician: Lawerance Cruel, MD Admit Date: 07/10/2019  Reason for Consultation/Follow-up: Establishing goals of care  Subjective: I saw and examined [name] today.  He reports feeling tired but denies other complaints including denying pain, shortness of breath, or anxiety.  Discussed plan for residential hospice, but that bed may not be available today.  He reports being in agreement "whenever I can go."   Length of Stay: 7  Current Medications: Scheduled Meds:  . capsaicin   Topical BID  . chlorhexidine  15 mL Mouth Rinse BID  . Chlorhexidine Gluconate Cloth  6 each Topical Q0600  . feeding supplement (ENSURE ENLIVE)  237 mL Oral Q24H  . furosemide  40 mg Oral Daily  . mouth rinse  15 mL Mouth Rinse q12n4p  . mirtazapine  7.5 mg Oral QHS  . umeclidinium-vilanterol  1 puff Inhalation Daily    Continuous Infusions:   PRN Meds: acetaminophen **OR** acetaminophen, albuterol, antiseptic oral rinse, bisacodyl, diphenhydrAMINE, fluticasone, glycopyrrolate **OR** glycopyrrolate **OR** glycopyrrolate, haloperidol **OR** haloperidol **OR** haloperidol lactate, HYDROmorphone (DILAUDID) injection, LORazepam **OR** LORazepam **OR** LORazepam, ondansetron (ZOFRAN) IV, oxyCODONE **OR** oxyCODONE, polyvinyl alcohol, psyllium  Physical Exam         Gen: Frail-appearing elderly gentleman resting in bed Ext: Weeping LE lesions CV: Irregular, rate controlled Abdomen: no distention Vital Signs: BP 108/63 (BP Location: Left Arm)   Pulse 70   Temp (!) 97.5 F (36.4 C) (Oral)   Resp 19   Ht 5\' 10"  (1.778 m)    Wt 65.3 kg   SpO2 95%   BMI 20.66 kg/m  SpO2: SpO2: 95 % O2 Device: O2 Device: Room Air O2 Flow Rate: O2 Flow Rate (L/min): 3 L/min  Intake/output summary:   Intake/Output Summary (Last 24 hours) at 07/17/2019 1713 Last data filed at 07/17/2019 1347 Gross per 24 hour  Intake 480 ml  Output --  Net 480 ml   LBM: Last BM Date: 07/14/19 Baseline Weight: Weight: 69.2 kg Most recent weight: Weight: 65.3 kg       Palliative Assessment/Data:    Flowsheet Rows     Most Recent Value  Intake  Tab  Referral Department  Hospitalist  Unit at Time of Referral  ER  Palliative Care Primary Diagnosis  Cardiac  Date Notified  07/11/19  Palliative Care Type  New Palliative care  Reason for referral  Clarify Goals of Care  Date of Admission  07/10/19  Date first seen by Palliative Care  07/12/19  # of days Palliative referral response time  1 Day(s)  # of days IP prior to Palliative referral  1  Clinical Assessment  Psychosocial & Spiritual Assessment  Palliative Care Outcomes      Patient Active Problem List   Diagnosis Date Noted  . Acute congestive heart failure ([place])   . Palliative care by specialist   . Goals of care, counseling/discussion   . General weakness   . Stage 3b chronic kidney disease   . Acute combined systolic and diastolic heart failure ([place])   . Hypotension   . Acute exacerbation of CHF (congestive heart failure) ([place]) 07/10/2019  . History of cardioversion 04/29/2019  . Sacral fracture, closed ([place]) 04/13/2018  . PCO (posterior capsular opacification), bilateral 05/26/2017  . Weakness of right leg 04/20/2017  . 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 Pseudophakia of both eyes 09/30/2016  . Impingement syndrome of right shoulder 06/16/2016  . Impingement syndrome of left shoulder 06/16/2016  .  Bilateral leg edema 06/14/2016  . Dizziness and giddiness 06/26/2015  . Abnormality of gait 06/26/2015  . Anxiety 04/14/2015  . GERD (gastroesophageal reflux disease) 04/03/2015  . OSA (obstructive sleep apnea) 04/03/2015  . HLD (hyperlipidemia) 04/03/2015  . SOB (shortness of breath)   . Left elbow fracture 03/29/2015  . Dizziness 01/01/2015  . LBP (low back pain) 01/01/2015  . Malignant neoplasm of prostate ([place]) 01/01/2015  . BP (high blood pressure) 01/01/2015  . DOE (dyspnea on exertion) 10/26/2013  . Essential hypertension, benign 10/26/2013  . Post-traumatic wound infection 10/17/2012  . Cellulitis 10/17/2012  . Atrial fibrillation with RVR ([place]) 10/22/2011  . DVT, lower extremity, distal, chronic ([place]) 10/22/2011  . Pacemaker 10/19/2011  . Prostate cancer ([place]) 10/19/2011  . COPD (chronic obstructive pulmonary disease) with emphysema ([place]) 02/04/2011  . Anemia 12/10/2010    Palliative Care Assessment & Plan   Patient Profile:    Assessment: 86 year old gentleman with sick sinus syndrome status post pacemaker, persistent atrial fibrillation, stage III CKD, COPD, history of prior DVT, history of hypertension.  Patient admitted to the hospital with acute on chronic systolic and diastolic heart failure and atrial fibrillation with rapid ventricular response.  Found to have left ventricular ejection fraction of 15-20% on surface echocardiogram.  Patient was started on diuretics as well as rate control medications.  Cardiology was consulted.  Palliative consultation for broad goals of care discussions.  Recommendations/Plan:  Plan: DNR DNI No cardioversion Goal moving forward is for comfort. He would like to transition to residential hospice for end of life care. Symptoms currently well controlled.  MAR reviewed and no excessive medication needs noted.  Continue symptom management medications as needed.       Code Status:    Code Status Orders  (From admission,  onward)         Start     Ordered   07/11/19 0853  Do not attempt resuscitation (DNR)  Continuous    Question Answer Comment  In the event of cardiac or respiratory ARREST Do  not call a "code blue"   In the event of cardiac or respiratory ARREST Do not perform Intubation, CPR, defibrillation or ACLS   In the event of cardiac or respiratory ARREST Use medication by any route, position, wound care, and other measures to relive pain and suffering. May use oxygen, suction and manual treatment of airway obstruction as needed for comfort.      07/11/19 0852        Code Status History    Date Active Date Inactive Code Status Order ID Comments User Context   07/11/2019 0000 07/11/2019 0852 Full Code [id number]  Harvie Bridge, DO ED   07/11/2019 0000 07/11/2019 0000 DNR [id number]  Harvie Bridge, DO ED   02/10/2018 1506 02/10/2018 1916 Full Code [id number]  Deboraha Sprang, MD Inpatient   10/17/2012 1527 10/24/2012 2207 DNR [id number]  Domenic Polite, MD Inpatient   10/19/2011 2325 10/25/2011 2356 Full Code [id number]  Ethel Rana, RN Inpatient   Advance Care Planning Activity       Prognosis: -  ? 2 weeks or less- patient with markedly low EF, functional decline, A fib RVR until recently, remains at high risk for sudden decompensation and death from arrhythmia.    Discharge Planning:  Residential hospice.   Care plan was discussed with patient,patient's wife and son  and bedside staff.       Thank you for allowing the Palliative Medicine Team to assist in the care of this patient.   Total Time 20 Prolonged Time Billed  no    Greater than 50%  of this time was spent counseling and coordinating care related to the above assessment and plan.  Micheline Rough, MD  Please contact Palliative Medicine Team phone at [phone] for questions and concerns.

## 2019-07-17 NOTE — Progress Notes (Signed)
PROGRESS NOTE  [name] [name] [id number] DOB: 1925/01/17 DOA: 07/10/2019 PCP: Lawerance Cruel, MD  HPI/Recap of past 24 hours: HPI from Dr Ara Kussmaul [name] [name] is a 86 y.o. male with a known history of Afib with PPM, CKD3, COPD,  presents to the emergency department for evaluation of weakness, SOB, LE edema.  Patient was in a usual state of health until about 10 days ago when he started to become progressively weaker, with worsening LE edema and now SOB.  Lives at home with his wife who is 89, was ambulating with a walker, but now needs assistance.  He did sustain a fall 3 weeks ago but recovered. In the ER he was found to be in afib with RVR, increased vascular congestion. Pt was given Lasix and started on a Cardizem drip.  Foley cath put out 300cc dark yellow urine so far. Patient has been taking medication as prescribed and there has been no recent change in medication or diet.  No recent antibiotics.  There has been no recent illness, hospitalizations, travel or sick contacts.  Patient admitted for further management.    Today, pt appears comfortable, denies any pain, denies chest pain, worsening SOB.     Assessment/Plan: Active Problems:   Atrial fibrillation with RVR (HCC)   Acute exacerbation of CHF (congestive heart failure) (HCC)   Stage 3b chronic kidney disease   Acute combined systolic and diastolic heart failure (HCC)   Hypotension   Palliative care by specialist   Goals of care, counseling/discussion   General weakness   Acute congestive heart failure (HCC)   Paroxysmal A. fib with RVR TSH WNL Status post amiodarone drip/p.o. amiodarone/Eliquis Cardiology consulted Comfort care  New onset acute systolic HF/moderate aortic stenosis/moderate aortic regurgitation/elevated troponin BNP elevated 1038 Likely cardiomyopathy due to A. fib as his pacemaker was interrogated and noted to have about 78% burden A. fib, been consistently in A. fib since May 22, 2019 Troponin mildly elevated with a flat trend, 68-73 Chest x-ray suggestive of vascular congestion Echo showed EF of 15 to 20%, global hypokinesis, moderately elevated pulmonary artery systolic pressure, [id number] mmhg, moderate to severe mitral valve regurgitation Comfort care  AKI on CKD stage 3B Comfort care  Generalized weakness/debility/fall Comfort care- Pending residential hospice  Bilateral lower extremity edema/wound Lower extremity Doppler negative for DVT Wound care consulted S/P PO doxycycline        Malnutrition Type:  Nutrition Problem: Increased nutrient needs Etiology: acute illness   Malnutrition Characteristics:  Signs/Symptoms: estimated needs   Nutrition Interventions:  Interventions: Ensure Enlive (each supplement provides 350kcal and 20 grams of protein), MVI    Estimated body mass index is 20.66 kg/m as calculated from the following:   Height as of this encounter: 5\' 10"  (1.778 m).   Weight as of this encounter: 65.3 kg.     Code Status: DNR  Family Communication: Discussed with son who is a physician on 07/16/2019  Disposition Plan: Status is: Inpatient  Remains inpatient appropriate because:Inpatient level of care appropriate due to severity of illness   Dispo: The patient is from: Home              Anticipated d/c is to: Residential hospice              Anticipated d/c date is: 1 day              Patient currently is medically stable to d/c. to residential hospice    Consultants:  Cardiology  Palliative   Procedures:  None  Antimicrobials:  S/p Doxycycline  DVT prophylaxis: None   Objective: Vitals:   07/16/19 1200 07/16/19 1300 07/17/19 0606 07/17/19 1026  BP: (!) 94/56 105/74 108/63   Pulse: 72 74 70   Resp: 16 (!) 23 19   Temp: 97.6 F (36.4 C)  (!) 97.5 F (36.4 C)   TempSrc: Axillary  Oral   SpO2: 96% 98% 94% 95%  Weight:      Height:        Intake/Output Summary (Last 24 hours) at 07/17/2019  1341 Last data filed at 07/17/2019 1000 Gross per 24 hour  Intake 720 ml  Output 400 ml  Net 320 ml   Filed Weights   07/14/19 0500 07/15/19 0500 07/16/19 0500  Weight: 70.8 kg 70.1 kg 65.3 kg    Exam:  General: NAD, chronically ill-appearing, frail, HOH, noted chest/BUE bruising   Cardiovascular: S1, S2 present  Respiratory:  Bibasilar crackles noted  Abdomen: Soft, nontender, nondistended, bowel sounds present  Musculoskeletal: 1+ bilateral pedal edema noted  Skin:  As mentioned above  Psychiatry: Fair mood   Data Reviewed: CBC: Recent Labs  Lab 07/12/19 0258 07/13/19 0255 07/14/19 0250 07/15/19 0303 07/16/19 0234  WBC 11.5* 10.4 7.8 10.3 9.3  NEUTROABS 10.3* 9.3* 6.6 9.0* 8.5*  HGB 12.7* 12.2* 12.1* 13.1 11.9*  HCT 40.8 40.1 40.7 43.1 39.0  MCV 110.3* 112.0* 111.8* 109.4* 111.1*  PLT 115* 126* 125* 136* [id number]*   Basic Metabolic Panel: Recent Labs  Lab 07/11/19 0001 07/11/19 0537 07/12/19 0258 07/13/19 0255 07/14/19 0250 07/15/19 0303 07/16/19 0234  NA  --    < > 142 137 135 140 140  K  --    < > 4.0 4.0 3.8 3.8 3.7  CL  --    < > 102 103 98 99 101  CO2  --    < > 25 24 26 30 28   GLUCOSE  --    < > 105* 109* 100* 95 108*  BUN  --    < > 45* 48* 51* 60* 54*  CREATININE  --    < > 1.28* 1.35* 1.31* 1.50* 1.33*  CALCIUM  --    < > 8.5* 8.3* 8.2* 8.4* 8.2*  MG 2.2  --   --   --   --   --   --    < > = values in this interval not displayed.   GFR: Estimated Creatinine Clearance: 32 mL/min (A) (by C-G formula based on SCr of 1.33 mg/dL (H)). Liver Function Tests: Recent Labs  Lab 07/10/19 1620  AST 38  ALT 30  ALKPHOS 81  BILITOT 1.9*  PROT 5.7*  ALBUMIN 3.4*   No results for input(s): LIPASE, AMYLASE in the last 168 hours. No results for input(s): AMMONIA in the last 168 hours. Coagulation Profile: No results for input(s): INR, PROTIME in the last 168 hours. Cardiac Enzymes: No results for input(s): CKTOTAL, CKMB, CKMBINDEX, TROPONINI in  the last 168 hours. BNP (last 3 results) No results for input(s): PROBNP in the last 8760 hours. HbA1C: No results for input(s): HGBA1C in the last 72 hours. CBG: No results for input(s): GLUCAP in the last 168 hours. Lipid Profile: No results for input(s): CHOL, HDL, LDLCALC, TRIG, CHOLHDL, LDLDIRECT in the last 72 hours. Thyroid Function Tests: No results for input(s): TSH, T4TOTAL, FREET4, T3FREE, THYROIDAB in the last 72 hours. Anemia Panel: No results for input(s): VITAMINB12, FOLATE, FERRITIN, TIBC, IRON, RETICCTPCT in  the last 72 hours. Urine analysis:    Component Value Date/Time   COLORURINE AMBER (A) 07/10/2019 1635   APPEARANCEUR CLEAR 07/10/2019 1635   LABSPEC 1.027 07/10/2019 1635   PHURINE 5.0 07/10/2019 1635   GLUCOSEU NEGATIVE 07/10/2019 1635   HGBUR NEGATIVE 07/10/2019 1635   BILIRUBINUR NEGATIVE 07/10/2019 1635   KETONESUR NEGATIVE 07/10/2019 1635   PROTEINUR 100 (A) 07/10/2019 1635   UROBILINOGEN 1.0 08/06/2009 1001   NITRITE NEGATIVE 07/10/2019 1635   LEUKOCYTESUR NEGATIVE 07/10/2019 1635   Sepsis Labs: @LABRCNTIP (procalcitonin:4,lacticidven:4)  ) Recent Results (from the past 240 hour(s))  Urine culture     Status: Abnormal   Collection Time: 07/10/19  4:35 PM   Specimen: Urine, Clean Catch  Result Value Ref Range Status   Specimen Description   Final    URINE, CLEAN CATCH Performed at [facility], [place] [address] [place], [place] [address]    Special Requests   Final    NONE Performed at [facility], [place] [address] [place], [place] [address]    Culture MULTIPLE SPECIES PRESENT, SUGGEST RECOLLECTION (A)  Final   Report Status 07/12/2019 FINAL  Final  SARS Coronavirus 2 by RT PCR (hospital order, performed in [facility] hospital lab) Nasopharyngeal Nasopharyngeal Swab     Status: None   Collection Time: 07/10/19  7:44 PM   Specimen: Nasopharyngeal Swab  Result Value Ref Range Status   SARS Coronavirus 2  NEGATIVE NEGATIVE Final    Comment: (NOTE) SARS-CoV-2 target nucleic acids are NOT DETECTED. The SARS-CoV-2 RNA is generally detectable in upper and lower respiratory specimens during the acute phase of infection. The lowest concentration of SARS-CoV-2 viral copies this assay can detect is 250 copies / mL. A negative result does not preclude SARS-CoV-2 infection and should not be used as the sole basis for treatment or other patient management decisions.  A negative result may occur with improper specimen collection / handling, submission of specimen other than nasopharyngeal swab, presence of viral mutation(s) within the areas targeted by this assay, and inadequate number of viral copies (<250 copies / mL). A negative result must be combined with clinical observations, patient history, and epidemiological information. Fact Sheet for Patients:   StrictlyIdeas.no Fact Sheet for Healthcare Providers: BankingDealers.co.za This test is not yet approved or cleared  by the [place] FDA and has been authorized for detection and/or diagnosis of SARS-CoV-2 by FDA under an Emergency Use Authorization (EUA).  This EUA will remain in effect (meaning this test can be used) for the duration of the COVID-19 declaration under Section 564(b)(1) of the Act, 21 U.S.C. section 360bbb-3(b)(1), unless the authorization is terminated or revoked sooner. Performed at [facility], [place] [address] [place], [place] [address]   MRSA PCR Screening     Status: Abnormal   Collection Time: 07/11/19  7:43 PM   Specimen: Nasopharyngeal  Result Value Ref Range Status   MRSA by PCR POSITIVE (A) NEGATIVE Final    Comment:        The GeneXpert MRSA Assay (FDA approved for NASAL specimens only), is one component of a comprehensive MRSA colonization surveillance program. It is not intended to diagnose MRSA infection nor to guide or monitor  treatment for MRSA infections. RESULT CALLED TO, READ BACK BY AND VERIFIED WITH: S.NESMITH AT 2220 ON 07/11/19 BY N.THOMPSON Performed at [facility], [place] [address] [place], [place] [address]       Studies: No results found.  Scheduled  Meds: . capsaicin   Topical BID  . chlorhexidine  15 mL Mouth Rinse BID  . Chlorhexidine Gluconate Cloth  6 each Topical Q0600  . feeding supplement (ENSURE ENLIVE)  237 mL Oral Q24H  . furosemide  40 mg Oral Daily  . mouth rinse  15 mL Mouth Rinse q12n4p  . mirtazapine  7.5 mg Oral QHS  . umeclidinium-vilanterol  1 puff Inhalation Daily    Continuous Infusions:    LOS: 7 days     Alma Friendly, MD [facility]  If 7PM-7AM, please contact night-coverage [email or web address] 07/17/2019, 1:41 PM

## 2019-07-17 NOTE — Care Management Important Message (Signed)
Important Message  Patient Details IM Letter given to [name] RN Case Manager to present to the Patient Name: [name] [name] MRN: [id number] Date of Birth: 10/04/1924   Medicare Important Message Given:  Yes     [name] 07/17/2019, 10:49 AM

## 2019-07-18 ENCOUNTER — Encounter (HOSPITAL_COMMUNITY)
Admission: EM | Disposition: A | Discharge status: Skilled Nursing Facility | Payer: Self-pay | Source: Home / Self Care | Attending: Internal Medicine

## 2019-07-18 SURGERY — CARDIOVERSION
Anesthesia: Monitor Anesthesia Care

## 2019-07-18 MED ORDER — APIXABAN 2.5 MG PO TABS: 2.5000 mg | ORAL_TABLET | Freq: Two times a day (BID) | ORAL | 0 refills | Status: DC

## 2019-07-18 NOTE — TOC Transition Note (Signed)
Transition of Care [facility]) - CM/SW Discharge Note   Patient Details  Name: [name] [name] MRN: [id number] Date of Birth: 07-Aug-1924  Transition of Care [facility]) CM/SW Contact:  Lynnell Catalan, RN Phone Number: 07/18/2019, 2:27 PM   Clinical Narrative:    This CM was notified that bed is available at [facility] today. Yellow DNR placed on shadow chart for MD signature. [facility] contacted for transport. RN to call report to [phone].     Social Determinants of Health (SDOH) Interventions     Readmission Risk Interventions No flowsheet data found.

## 2019-07-18 NOTE — TOC Transition Note (Signed)
Transition of Care [facility]) - CM/SW Discharge Note   Patient Details  Name: [name] [name] MRN: [id number] Date of Birth: 1924-06-24  Transition of Care [facility]) CM/SW Contact:  Lynnell Catalan, RN Phone Number: 07/18/2019, 4:15 PM   Clinical Narrative:     This CM met with MD and son at pt bedside. Pt and son express that the pt was unclear about what "hospice" meant and he would now like to go to SNF for rehab instead of [facility]. PTAR cancelled and [employer] contacted to inform. TOC will continue to follow and work pt up for SNF once PT/OT has evaluated pt again.

## 2019-07-18 NOTE — Plan of Care (Signed)
Pt VS WNL this morning.  No complaints at this time.  Ready for d/c.   Problem: Clinical Measurements: Goal: Ability to maintain clinical measurements within normal limits will improve Outcome: Progressing Goal: Will remain free from infection Outcome: Progressing Goal: Diagnostic test results will improve Outcome: Progressing Goal: Respiratory complications will improve Outcome: Progressing Goal: Cardiovascular complication will be avoided Outcome: Progressing   Problem: Pain Managment: Goal: General experience of comfort will improve Outcome: Progressing   Problem: Safety: Goal: Ability to remain free from injury will improve Outcome: Progressing   Problem: Skin Integrity: Goal: Risk for impaired skin integrity will decrease Outcome: Progressing   Problem: Clinical Measurements: Goal: Respiratory complications will improve Outcome: Progressing   Problem: Activity: Goal: Risk for activity intolerance will decrease Outcome: Not Progressing

## 2019-07-18 NOTE — Progress Notes (Signed)
Daily Progress Note   Patient Name: [name] [name]       Date: 07/18/2019 DOB: [birth year]  Age: 86 y.o. MRN#: [id number] Attending Physician: Darliss Cheney, MD Primary Care Physician: Lawerance Cruel, MD Admit Date: 07/10/2019  Reason for Consultation/Follow-up: Establishing goals of care  Subjective: I saw and examined [name] [name] today.  He reports that he has been having increased pain at site of sore on his back, but it improves with pain medication.  Denies shortness of breath or other complaints.  I do note increased congestion today vs yesterday.  Reviewed plan for transition to residential hospice when bed available.  He reports, "I'm ready when they are, but I know they are busy and waiting for a bed."   Length of Stay: 8  Current Medications: Scheduled Meds:  . capsaicin   Topical BID  . chlorhexidine  15 mL Mouth Rinse BID  . Chlorhexidine Gluconate Cloth  6 each Topical Q0600  . feeding supplement (ENSURE ENLIVE)  237 mL Oral Q24H  . furosemide  40 mg Oral Daily  . mouth rinse  15 mL Mouth Rinse q12n4p  . mirtazapine  7.5 mg Oral QHS  . umeclidinium-vilanterol  1 puff Inhalation Daily    Continuous Infusions:   PRN Meds: acetaminophen **OR** acetaminophen, albuterol, antiseptic oral rinse, bisacodyl, diphenhydrAMINE, fluticasone, glycopyrrolate **OR** glycopyrrolate **OR** glycopyrrolate, haloperidol **OR** haloperidol **OR** haloperidol lactate, HYDROmorphone (DILAUDID) injection, LORazepam **OR** LORazepam **OR** LORazepam, ondansetron (ZOFRAN) IV, oxyCODONE **OR** oxyCODONE, polyvinyl alcohol, psyllium  Physical Exam         Gen: Frail-appearing elderly gentleman resting in bed Ext: Weeping LE lesions CV: Irregular, rate controlled Resp: Scattered  coarse Abdomen: no distention Vital Signs: BP 124/82 (BP Location: Left Arm)   Pulse 69   Temp 98.2 F (36.8 C)   Resp 16   Ht 5\' 10"  (1.778 m)   Wt 65.3 kg   SpO2 94%   BMI 20.66 kg/m  SpO2: SpO2: 94 % O2 Device: O2 Device: Room Air O2 Flow Rate: O2 Flow Rate (L/min): 3 L/min  Intake/output summary:   Intake/Output Summary (Last 24 hours) at 07/18/2019 1231 Last data filed at 07/18/2019 0720 Gross per 24 hour  Intake 240 ml  Output 800 ml  Net -560 ml   LBM: Last BM Date: 07/14/19 Baseline Weight: Weight:  69.2 kg Most recent weight: Weight: 65.3 kg       Palliative Assessment/Data:    Flowsheet Rows     Most Recent Value  Intake Tab  Referral Department  Hospitalist  Unit at Time of Referral  ER  Palliative Care Primary Diagnosis  Cardiac  Date Notified  07/11/19  Palliative Care Type  New Palliative care  Reason for referral  Clarify Goals of Care  Date of Admission  07/10/19  Date first seen by Palliative Care  07/12/19  # of days Palliative referral response time  1 Day(s)  # of days IP prior to Palliative referral  1  Clinical Assessment  Psychosocial & Spiritual Assessment  Palliative Care Outcomes      Patient Active Problem List   Diagnosis Date Noted  . Acute congestive heart failure ([place])   . Palliative care by specialist   . Goals of care, counseling/discussion   . General weakness   . Stage 3b chronic kidney disease   . Acute combined systolic and diastolic heart failure ([place])   . Hypotension   . Acute exacerbation of CHF (congestive heart failure) ([place]) 07/10/2019  . History of cardioversion 04/29/2019  . Sacral fracture, closed ([place]) 04/13/2018  . PCO (posterior capsular opacification), bilateral 05/26/2017  . Weakness of right leg 04/20/2017  . 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 Pseudophakia of both eyes 09/30/2016  . Impingement syndrome of right shoulder 06/16/2016  . Impingement syndrome of left shoulder 06/16/2016  . Bilateral leg edema 06/14/2016  . Dizziness and giddiness 06/26/2015  . Abnormality of gait 06/26/2015  . Anxiety 04/14/2015  . GERD (gastroesophageal reflux disease) 04/03/2015  . OSA (obstructive sleep apnea) 04/03/2015  . HLD (hyperlipidemia) 04/03/2015  . SOB (shortness of breath)   . Left elbow fracture 03/29/2015  . Dizziness 01/01/2015  . LBP (low back pain) 01/01/2015  . Malignant neoplasm of prostate ([place]) 01/01/2015  . BP (high blood pressure) 01/01/2015  . DOE (dyspnea on exertion) 10/26/2013  . Essential hypertension, benign 10/26/2013  . Post-traumatic wound infection 10/17/2012  . Cellulitis 10/17/2012  . Atrial fibrillation with RVR ([place]) 10/22/2011  . DVT, lower extremity, distal, chronic ([place]) 10/22/2011  . Pacemaker 10/19/2011  . Prostate cancer ([place]) 10/19/2011  . COPD (chronic obstructive pulmonary disease) with emphysema ([place]) 02/04/2011  . Anemia 12/10/2010    Palliative Care Assessment & Plan   Patient Profile:    Assessment: 86 year old gentleman with sick sinus syndrome status post pacemaker, persistent atrial fibrillation, stage III CKD, COPD, history of prior DVT, history of hypertension.  Patient admitted to the hospital with acute on chronic systolic and diastolic heart failure and atrial fibrillation with rapid ventricular response.  Found to have left ventricular ejection fraction of 15-20% on surface echocardiogram.  Patient was started on diuretics as well as rate control medications.  Cardiology was consulted.  Palliative consultation for broad goals of care discussions.  Recommendations/Plan:  Plan: - Plan for transition to residential hospice when bed available.  He is stable for transport at this point. -Pain: Continue oxycodone as needed. Other symptoms currently well controlled.  MAR  reviewed and no excessive medication needs noted.  Continue symptom management medications as needed.       Code Status:    Code Status Orders  (From admission, onward)         Start  Ordered   07/11/19 0853  Do not attempt resuscitation (DNR)  Continuous    Question Answer Comment  In the event of cardiac or respiratory ARREST Do not call a "code blue"   In the event of cardiac or respiratory ARREST Do not perform Intubation, CPR, defibrillation or ACLS   In the event of cardiac or respiratory ARREST Use medication by any route, position, wound care, and other measures to relive pain and suffering. May use oxygen, suction and manual treatment of airway obstruction as needed for comfort.      07/11/19 0852        Code Status History    Date Active Date Inactive Code Status Order ID Comments User Context   07/11/2019 0000 07/11/2019 0852 Full Code [id number]  Harvie Bridge, DO ED   07/11/2019 0000 07/11/2019 0000 DNR [id number]  Harvie Bridge, DO ED   02/10/2018 1506 02/10/2018 1916 Full Code [id number]  Deboraha Sprang, MD Inpatient   10/17/2012 1527 10/24/2012 2207 DNR [id number]  Domenic Polite, MD Inpatient   10/19/2011 2325 10/25/2011 2356 Full Code [id number]  Ethel Rana, RN Inpatient   Advance Care Planning Activity       Prognosis: -  ? 2 weeks or less- patient with markedly low EF, functional decline, A fib RVR until recently, remains at high risk for sudden decompensation and death from arrhythmia. Having increased cough and congestion today.  ? If may be aspirating as well, but no plan for further workup and continue to allow intake as desired for comfort.   Discharge Planning:  Residential hospice.   Care plan was discussed with patient,patient's wife and son  and bedside staff.       Thank you for allowing the Palliative Medicine Team to assist in the care of this patient.   Total Time 20 Prolonged Time Billed  no    Greater than 50%  of this time was spent  counseling and coordinating care related to the above assessment and plan.  Micheline Rough, MD  Please contact Palliative Medicine Team phone at [phone] for questions and concerns.

## 2019-07-18 NOTE — Progress Notes (Signed)
Patient was discharged few hours ago to the hospice.  Plan has been to send him hospice as he has picked himself for comfort care several days ago.  Palliative care was also involved in the case.  Finally we had a bed available at [facility] so discharge was completed however right before he was being picked up by ambulance, I received a message from the nurse that patient's son [name] [name] would like to talk to me about his father's plan of care.  Went in and talked to patient's son initially privately for 10 to 15 minutes very explain to me how sick his father was few days ago when he himself picked hospice for himself however patient has improved significantly since then and currently he is alert and oriented and hemodynamically stable.  According to the son, this morning patient called the son and was telling him what exactly stuff he will need at the new place/long-term care facility that they were planning to go before this hospitalization.  [name] [name] told me that the patient is not clear about the concept of hospice and patient thought that he will go to hospice place to get better and then will go to long-term care facility on his home after that.  We then talked to the patient himself as he is alert and oriented and he does not have any healthcare power of attorney.  Patient does seem to have poor understanding of what hospice is and he expressed himself that he was under the impression that he is going to hospice home/[facility] to get better before he goes home or to long-term care.  He prefers to go somewhere where he can get better and go home.  Based on all of this, patient's discharge has been canceled today.  We will consult PT OT.  His son wants me to consult cardiology once again to put him back on medications.  Case manager was partially in the conversation however she is aware of the plan and will work on finding a place for him.

## 2019-07-18 NOTE — Progress Notes (Signed)
Nutrition Brief Note  Chart reviewed. Pt now transitioning to comfort care.  No further nutrition interventions warranted at this time.    Chazlyn Cude, MS, RD, LDN Inpatient Clinical Dietitian Contact information available via Amion    

## 2019-07-18 NOTE — Discharge Summary (Signed)
Physician Discharge Summary  [name] [name] [id number] DOB: [birth year] DOA: 07/10/2019  PCP: Lawerance Cruel, MD  Admit date: 07/10/2019 Discharge date: 07/18/2019  Admitted From: Home Disposition: [place]  Recommendations for Outpatient Follow-up:  1. Follow up with PCP in 1-2 weeks 2. Please obtain BMP/CBC in one week 3. Please follow up with your PCP on the following pending results: Unresulted Labs (From admission, onward)    Start     Ordered   Signed and Held  Protime-INR  Once,   STAT    Question:  Specimen collection method  Answer:  Lab=Lab collect   Signed and Held           Home Health: None Equipment/Devices: None  Discharge Condition: Fair CODE STATUS: DNR Diet recommendation: Heart healthy  Subjective: Patient seen and examined.  He has no complaints.  Brief/Interim Summary: [name] [name] a 86 y.o.malewith a known history of Afib with PPM, CKD3, COPD,presented to the emergency department for evaluation of weakness, SOB, LE edema. Patient was in a usual state of health until about 10 days prior to admission when he started to become progressively weaker, with worsening LE edema and SOB.Lives at home with his wife who is 74, was ambulating with a walker, but now needs assistance. He did sustain a fall 3 weeks ago but recovered. In the ER he was found to be in afib with RVR, increased vascular congestion. Pt was given Lasix and started on a Cardizem drip. Foley cath put out 300cc dark yellow urine so far. Patient has been taking medication as prescribed and there has been no recent change in medication or diet. No recent antibiotics. There has been no recent illness, hospitalizations, travel or sick contacts.  Patient admitted for further management.  TSH within normal limit.  He was started on amiodarone drip followed by p.o. amiodarone and Eliquis.  Cardiology was managing this.  BNP was elevated at 1038.  His pacemaker was interrogated which had  about 78% burden of A. fib.  Troponin was slightly elevated.  Echo showed ejection fraction of 15 to 20% with global hypokinesis and moderately elevated pulmonary artery systolic pressure.  Looks like subsequently his amiodarone and beta-blocker were also stopped and cardiology recommended reducing his Eliquis to 2.5 mg twice daily based on his weight and renal function which was labile.  Subsequently patient elected for hospice.  He remained stable.  He is being discharged to [place].  Discharge Diagnoses:  Active Problems:   Atrial fibrillation with RVR (HCC)   Acute exacerbation of CHF (congestive heart failure) (HCC)   Stage 3b chronic kidney disease   Acute combined systolic and diastolic heart failure (HCC)   Hypotension   Palliative care by specialist   Goals of care, counseling/discussion   General weakness   Acute congestive heart failure [facility])    Discharge Instructions  Discharge Instructions    Discharge patient   Complete by: As directed    Discharge disposition: 50-Hospice/Home   Discharge patient date: 07/18/2019     Allergies as of 07/18/2019   No Known Allergies     Medication List    TAKE these medications   acetaminophen 650 MG CR tablet Commonly known as: TYLENOL Take 650 mg by mouth 2 (two) times daily.   albuterol 108 (90 Base) MCG/ACT inhaler Commonly known as: VENTOLIN HFA Inhale 2 puffs into the lungs every 6 (six) hours as needed for wheezing or shortness of breath.   Anoro Ellipta 62.5-25 MCG/INH Aepb Generic drug:  umeclidinium-vilanterol Inhale 1 puff into the lungs daily.   apixaban 5 MG Tabs tablet Commonly known as: Eliquis Take 1 tablet (5 mg total) by mouth 2 (two) times daily. What changed: how much to take   CALCIUM-D PO Take 1 tablet by mouth every evening.   diphenhydrAMINE 25 MG tablet Commonly known as: BENADRYL Take 25 mg by mouth daily as needed for allergies.   ferrous sulfate 325 (65 FE) MG tablet Take 650 mg by mouth  daily with breakfast.   fluticasone 50 MCG/ACT nasal spray Commonly known as: FLONASE Place 1 spray into both nostrils daily as needed for allergies or rhinitis.   Folic Acid-Vit Q000111Q 123456 2.5-25-1 MG Tabs tablet Commonly known as: FOLBEE Take 1 tablet by mouth daily.   furosemide 20 MG tablet Commonly known as: LASIX Take 20 mg by mouth daily.   Myrbetriq 50 MG Tb24 tablet Generic drug: mirabegron ER Take 50 mg by mouth daily.   oxyCODONE 5 MG immediate release tablet Commonly known as: Oxy IR/ROXICODONE Take 5 mg by mouth every 6 (six) hours as needed for severe pain.   oxymetazoline 0.05 % nasal spray Commonly known as: AFRIN Place 1 spray into both nostrils 2 (two) times daily as needed for congestion.   pantoprazole 40 MG tablet Commonly known as: Protonix Take 1 tablet (40 mg total) by mouth daily before breakfast.   predniSONE 10 MG tablet Commonly known as: DELTASONE Take 10 mg by mouth daily with breakfast.   predniSONE 10 MG tablet Commonly known as: DELTASONE 4 tabs for 2 days, then 3 tabs for 2 days, 2 tabs for 2 days, then 1 tab for 2 days, then stop   PRESERVISION AREDS 2 PO Take 1 each by mouth in the morning and at bedtime.   psyllium 28 % packet Commonly known as: METAMUCIL SMOOTH TEXTURE Take 1 packet by mouth daily as needed. Mix with 8 oz liquid   vitamin B-12 500 MCG tablet Commonly known as: CYANOCOBALAMIN Take 500 mcg by mouth. Three days a week   VITAMIN D3 PO Take 2,000 Units by mouth daily.      Follow-up Information    Lawerance Cruel, MD Follow up in 1 week(s).   Specialty: Family Medicine Contact information: [place] [address] [phone]        Jettie Booze, MD .   Specialties: Cardiology, Radiology, Interventional Cardiology Contact information: [id number] [address] [place] [address] [phone]        Deboraha Sprang, MD .   Specialty: Cardiology Contact  information: [phone] [address] [place] [address] [phone]          No Known Allergies  Consultations: Cardiology and palliative care   Procedures/Studies: CT Head Wo Contrast  Result Date: 07/10/2019 CLINICAL DATA:  Progressive weakness fall 3 weeks ago EXAM: CT HEAD WITHOUT CONTRAST TECHNIQUE: Contiguous axial images were obtained from the base of the skull through the vertex without intravenous contrast. COMPARISON:  CT brain 12/17/2017 FINDINGS: Brain: No acute territorial infarction, hemorrhage or intracranial mass. Moderate atrophy. Mild chronic small vessel ischemic change of the white matter. Stable ventricle size. Slight asymmetric enlargement of left convexity extra-axial CSF density potentially due to chronic subdural effusion. Vascular: No hyperdense vessels.  Carotid vascular calcification Skull: Normal. Negative for fracture or focal lesion. Sinuses/Orbits: Postsurgical changes of the right maxillary sinus. Mucosal thickening. Other: None IMPRESSION: 1. No CT evidence for acute intracranial abnormality. 2. Atrophy  and chronic small vessel ischemic change of the white matter Electronically Signed   By: Donavan Foil M.D.   On: 07/10/2019 18:09   CT Cervical Spine Wo Contrast  Result Date: 07/10/2019 CLINICAL DATA:  Progressive weakness, history of fall 3 weeks ago EXAM: CT CERVICAL SPINE WITHOUT CONTRAST TECHNIQUE: Multidetector CT imaging of the cervical spine was performed without intravenous contrast. Multiplanar CT image reconstructions were also generated. COMPARISON:  CT 12/17/2017 FINDINGS: Alignment: Trace retrolisthesis C4 on C5 and anterolisthesis C7 on T1 without change. Facet alignment is maintained Skull base and vertebrae: No acute fracture. No primary bone lesion or focal pathologic process. Soft tissues and spinal canal: No prevertebral fluid or swelling. No visible canal hematoma. Disc levels: Moderate severe degenerative changes throughout the  cervical spine with multiple level disc space narrowing and osteophyte. Facet degenerative change at multiple levels with multiple level bilateral foraminal stenosis. Upper chest: Bilateral pleural effusions Other: None IMPRESSION: 1. Diffuse degenerative changes without acute fracture 2. Pleural effusions at the apices Electronically Signed   By: Donavan Foil M.D.   On: 07/10/2019 18:14   DG Chest Port 1 View  Result Date: 07/10/2019 CLINICAL DATA:  Generalized weakness, bilateral lower extremity edema EXAM: PORTABLE CHEST 1 VIEW COMPARISON:  06/20/2019 FINDINGS: Single frontal view of the chest demonstrates increased veiling opacities at the lung bases consistent with progressive consolidation and enlarging bilateral pleural effusions. Increased central vascular congestion. Cardiac silhouette is stable. No pneumothorax. Dual lead pacer unchanged. IMPRESSION: 1. Worsening volume status with progressive congestive heart failure. Electronically Signed   By: Randa Ngo M.D.   On: 07/10/2019 16:57   ECHOCARDIOGRAM COMPLETE  Result Date: 07/11/2019    ECHOCARDIOGRAM REPORT   Patient Name:   [name] [name] Date of Exam: 07/11/2019 Medical Rec #:  [id number]         Height:       70.0 in Accession #:    [id number]        Weight:       155.0 lb Date of Birth:  May 27, 1924          BSA:          1.873 m Patient Age:    67 years          BP:           124/78 mmHg Patient Gender: M                 HR:           70 bpm. Exam Location:  Inpatient Procedure: 2D Echo Indications:    CHF 428.31  History:        Patient has prior history of Echocardiogram examinations, most                 recent 11/21/2012. Pacemaker, COPD; Risk Factors:Hypertension and                 Former Smoker. Pulmonary hypertension. sinus node dysfunction.  Sonographer:    Jannett Celestine RDCS (AE) Referring Phys: [id number] ALEXIS HUGELMEYER  Sonographer Comments: limited mobility IMPRESSIONS  1. Severely reduced LV function, 15-20%. LVOT VTI 8 cm.  Left ventricular ejection fraction, by estimation, is 15-20%. The left ventricle has severely decreased function. The left ventricle demonstrates global hypokinesis. Left ventricular diastolic function could not be evaluated.  2. Right ventricular systolic function is moderately reduced. The right ventricular size is moderately enlarged. There is moderately elevated pulmonary artery systolic pressure. The estimated right  ventricular systolic pressure is [id number] mmHg.  3. Left atrial size was moderately dilated.  4. Right atrial size was moderately dilated.  5. The mitral valve is degenerative. Moderate to severe mitral valve regurgitation. No evidence of mitral stenosis.  6. The aortic valve is tricuspid and severely calcified with restricted movement in systole. The gradients are quite low given the severely reduced LV function (LVOT VTI 8 cm, SV=34 cc, SVi=18 cc/m2). AVA by VTI is 1.44 cm2 and DI is 0.34, consistent with moderate aortic stenosis. If there are concerns for severe aortic stenosis, would recommend an aortic valve calcium score for clarification. The aortic valve is tricuspid. Aortic valve regurgitation is not visualized. Moderate to severe aortic valve  stenosis. Aortic valve area, by VTI measures 1.44 cm. Aortic valve mean gradient measures 5.0 mmHg. Aortic valve Vmax measures 1.50 m/s. FINDINGS  Left Ventricle: Severely reduced LV function, 15-20%. LVOT VTI 8 cm. Left ventricular ejection fraction, by estimation, is 15-20%. The left ventricle has severely decreased function. The left ventricle demonstrates global hypokinesis. The left ventricular internal cavity size was normal in size. There is no left ventricular hypertrophy. Left ventricular diastolic function could not be evaluated due to atrial fibrillation. Left ventricular diastolic function could not be evaluated. Right Ventricle: The right ventricular size is moderately enlarged. No increase in right ventricular wall thickness. Right  ventricular systolic function is moderately reduced. There is moderately elevated pulmonary artery systolic pressure. The tricuspid  regurgitant velocity is 3.16 m/s, and with an assumed right atrial pressure of 10 mmHg, the estimated right ventricular systolic pressure is [id number] mmHg. Left Atrium: Left atrial size was moderately dilated. Right Atrium: Right atrial size was moderately dilated. Pericardium: Trivial pericardial effusion is present. Mitral Valve: The mitral valve is degenerative in appearance. Mild mitral annular calcification. Moderate to severe mitral valve regurgitation. No evidence of mitral valve stenosis. Tricuspid Valve: The tricuspid valve is grossly normal. Tricuspid valve regurgitation is mild . No evidence of tricuspid stenosis. Aortic Valve: The aortic valve is tricuspid and severely calcified with restricted movement in systole. The gradients are quite low given the severely reduced LV function (LVOT VTI 8 cm, SV=34 cc, SVi=18 cc/m2). AVA by VTI is 1.44 cm2 and DI is 0.34, consistent with moderate aortic stenosis. If there are concerns for severe aortic stenosis, would recommend an aortic valve calcium score for clarification. The aortic valve is tricuspid. Aortic valve regurgitation is not visualized. Moderate to severe aortic stenosis is present. Aortic valve mean gradient measures 5.0 mmHg. Aortic valve peak gradient measures 9.0 mmHg. Aortic valve area, by VTI measures 1.44 cm. Pulmonic Valve: The pulmonic valve was grossly normal. Pulmonic valve regurgitation is not visualized. No evidence of pulmonic stenosis. Aorta: The aortic root is normal in size and structure. IAS/Shunts: The atrial septum is grossly normal. Additional Comments: A pacer wire is visualized in the right atrium and right ventricle. There is a small pleural effusion in the left lateral region.  LEFT VENTRICLE PLAX 2D LVIDd:         5.20 cm LVIDs:         4.10 cm LV PW:         1.10 cm LV IVS:        0.90 cm LVOT  diam:     2.30 cm LV SV:         34 LV SV Index:   18 LVOT Area:     4.15 cm  LEFT ATRIUM  Index LA diam:    4.60 cm 2.46 cm/m  AORTIC VALVE AV Area (Vmax):    1.27 cm AV Area (Vmean):   1.29 cm AV Area (VTI):     1.44 cm AV Vmax:           149.87 cm/s AV Vmean:          104.497 cm/s AV VTI:            0.238 m AV Peak Grad:      9.0 mmHg AV Mean Grad:      5.0 mmHg LVOT Vmax:         45.70 cm/s LVOT Vmean:        32.500 cm/s LVOT VTI:          0.082 m LVOT/AV VTI ratio: 0.35  AORTA Ao Root diam: 3.20 cm MITRAL VALVE               TRICUSPID VALVE MV Area (PHT): 2.11 cm    TR Peak grad:   39.9 mmHg MV Decel Time: 359 msec    TR Vmax:        316.00 cm/s MR Peak grad: 84.8 mmHg MR Mean grad: 50.0 mmHg    SHUNTS MR Vmax:      460.50 cm/s  Systemic VTI:  0.08 m MR Vmean:     329.0 cm/s   Systemic Diam: 2.30 cm MV E velocity: 56.10 cm/s Eleonore Chiquito MD Electronically signed by Eleonore Chiquito MD Signature Date/Time: 07/11/2019/4:04:21 PM    Final    XR Chest 2 View  Result Date: 06/20/2019 Radiographs of the chest without evidence of fracture. No acute finding.   XR Shoulder Left  Result Date: 06/20/2019 Radiographs of left shoulder without acute finding. There are significant degenerative changes of the left shoulder. Superior migration of humeral head.   VAS [place] LOWER EXTREMITY VENOUS (DVT)  Result Date: 07/12/2019  Lower Venous DVTStudy Indications: Swelling, and Edema.  Comparison Study: NO PRIOR Performing Technologist: Abram Sander RVS  Examination Guidelines: A complete evaluation includes B-mode imaging, spectral Doppler, color Doppler, and power Doppler as needed of all accessible portions of each vessel. Bilateral testing is considered an integral part of a complete examination. Limited examinations for reoccurring indications may be performed as noted. The reflux portion of the exam is performed with the patient in reverse Trendelenburg.   +---------+---------------+---------+-----------+----------+--------------+  RIGHT     Compressibility Phasicity Spontaneity Properties Thrombus Aging  +---------+---------------+---------+-----------+----------+--------------+  CFV       Full            Yes       Yes                                    +---------+---------------+---------+-----------+----------+--------------+  SFJ       Full                                                             +---------+---------------+---------+-----------+----------+--------------+  FV Prox   Full                                                             +---------+---------------+---------+-----------+----------+--------------+  FV Mid    Full                                                             +---------+---------------+---------+-----------+----------+--------------+  FV Distal Full                                                             +---------+---------------+---------+-----------+----------+--------------+  PFV       Full                                                             +---------+---------------+---------+-----------+----------+--------------+  POP       Full            Yes       Yes                                    +---------+---------------+---------+-----------+----------+--------------+  PTV       Full                                                             +---------+---------------+---------+-----------+----------+--------------+  PERO                                                       Not visualized  +---------+---------------+---------+-----------+----------+--------------+   +---------+---------------+---------+-----------+----------+--------------+  LEFT      Compressibility Phasicity Spontaneity Properties Thrombus Aging  +---------+---------------+---------+-----------+----------+--------------+  CFV       Full            Yes       Yes                                     +---------+---------------+---------+-----------+----------+--------------+  SFJ       Full                                                             +---------+---------------+---------+-----------+----------+--------------+  FV Prox   Full                                                             +---------+---------------+---------+-----------+----------+--------------+  FV Mid    Full                                                             +---------+---------------+---------+-----------+----------+--------------+  FV Distal Full                                                             +---------+---------------+---------+-----------+----------+--------------+  PFV       Full                                                             +---------+---------------+---------+-----------+----------+--------------+  POP       Full            Yes       Yes                                    +---------+---------------+---------+-----------+----------+--------------+  PTV       Full                                                             +---------+---------------+---------+-----------+----------+--------------+  PERO                                                       Not visualized  +---------+---------------+---------+-----------+----------+--------------+     Summary: BILATERAL: - No evidence of deep vein thrombosis seen in the lower extremities, bilaterally. -   *See table(s) above for measurements and observations. Electronically signed by Servando Snare MD on 07/12/2019 at 3:37:31 PM.    Final       Discharge Exam: Vitals:   07/18/19 0629 07/18/19 0855  BP: 124/82   Pulse: 69   Resp: 16   Temp: 98.2 F (36.8 C)   SpO2: 95% 94%   Vitals:   07/17/19 0606 07/17/19 1026 07/18/19 0629 07/18/19 0855  BP: 108/63  124/82   Pulse: 70  69   Resp: 19  16   Temp: (!) 97.5 F (36.4 C)  98.2 F (36.8 C)   TempSrc: Oral     SpO2: 94% 95% 95% 94%  Weight:      Height:        General: Pt  is alert, awake, not in acute distress Cardiovascular: Irregularly irregular rate and rhythm, S1/S2 +, no rubs, no gallops Respiratory: CTA bilaterally, no wheezing, no rhonchi Abdominal: Soft, NT, ND, bowel sounds + Extremities: no edema, no cyanosis    The results of significant diagnostics from this  hospitalization (including imaging, microbiology, ancillary and laboratory) are listed below for reference.     Microbiology: Recent Results (from the past 240 hour(s))  Urine culture     Status: Abnormal   Collection Time: 07/10/19  4:35 PM   Specimen: Urine, Clean Catch  Result Value Ref Range Status   Specimen Description   Final    URINE, CLEAN CATCH Performed at [facility], [place] [address], [place], [place] [address]    Special Requests   Final    NONE Performed at [facility], [place] [address] [place], [place] [address]    Culture MULTIPLE SPECIES PRESENT, SUGGEST RECOLLECTION (A)  Final   Report Status 07/12/2019 FINAL  Final  SARS Coronavirus 2 by RT PCR (hospital order, performed in [facility] hospital lab) Nasopharyngeal Nasopharyngeal Swab     Status: None   Collection Time: 07/10/19  7:44 PM   Specimen: Nasopharyngeal Swab  Result Value Ref Range Status   SARS Coronavirus 2 NEGATIVE NEGATIVE Final    Comment: (NOTE) SARS-CoV-2 target nucleic acids are NOT DETECTED. The SARS-CoV-2 RNA is generally detectable in upper and lower respiratory specimens during the acute phase of infection. The lowest concentration of SARS-CoV-2 viral copies this assay can detect is 250 copies / mL. A negative result does not preclude SARS-CoV-2 infection and should not be used as the sole basis for treatment or other patient management decisions.  A negative result may occur with improper specimen collection / handling, submission of specimen other than nasopharyngeal swab, presence of viral mutation(s) within the areas targeted by this assay, and  inadequate number of viral copies (<250 copies / mL). A negative result must be combined with clinical observations, patient history, and epidemiological information. Fact Sheet for Patients:   StrictlyIdeas.no Fact Sheet for Healthcare Providers: BankingDealers.co.za This test is not yet approved or cleared  by the [place] FDA and has been authorized for detection and/or diagnosis of SARS-CoV-2 by FDA under an Emergency Use Authorization (EUA).  This EUA will remain in effect (meaning this test can be used) for the duration of the COVID-19 declaration under Section 564(b)(1) of the Act, 21 U.S.C. section 360bbb-3(b)(1), unless the authorization is terminated or revoked sooner. Performed at [facility], [place] [address] [place], [place] [address]   MRSA PCR Screening     Status: Abnormal   Collection Time: 07/11/19  7:43 PM   Specimen: Nasopharyngeal  Result Value Ref Range Status   MRSA by PCR POSITIVE (A) NEGATIVE Final    Comment:        The GeneXpert MRSA Assay (FDA approved for NASAL specimens only), is one component of a comprehensive MRSA colonization surveillance program. It is not intended to diagnose MRSA infection nor to guide or monitor treatment for MRSA infections. RESULT CALLED TO, READ BACK BY AND VERIFIED WITH: S.NESMITH AT 2220 ON 07/11/19 BY N.THOMPSON Performed at [facility], [place] [address], [place], [place] [address]      Labs: BNP (last 3 results) Recent Labs    07/10/19 1620  BNP [id number]*   Basic Metabolic Panel: Recent Labs  Lab 07/12/19 0258 07/13/19 0255 07/14/19 0250 07/15/19 0303 07/16/19 0234  NA 142 137 135 140 140  K 4.0 4.0 3.8 3.8 3.7  CL 102 103 98 99 101  CO2 25 24 26 30 28   GLUCOSE 105* 109* 100* 95 108*  BUN 45* 48* 51* 60* 54*  CREATININE 1.28* 1.35* 1.31* 1.50* 1.33*  CALCIUM 8.5* 8.3* 8.2* 8.4* 8.2*   Liver Function Tests: No  results for input(s): AST, ALT, ALKPHOS, BILITOT, PROT, ALBUMIN in the last 168 hours. No results for input(s): LIPASE, AMYLASE in the last 168 hours. No results for input(s): AMMONIA in the last 168 hours. CBC: Recent Labs  Lab 07/12/19 0258 07/13/19 0255 07/14/19 0250 07/15/19 0303 07/16/19 0234  WBC 11.5* 10.4 7.8 10.3 9.3  NEUTROABS 10.3* 9.3* 6.6 9.0* 8.5*  HGB 12.7* 12.2* 12.1* 13.1 11.9*  HCT 40.8 40.1 40.7 43.1 39.0  MCV 110.3* 112.0* 111.8* 109.4* 111.1*  PLT 115* 126* 125* 136* 115*   Cardiac Enzymes: No results for input(s): CKTOTAL, CKMB, CKMBINDEX, TROPONINI in the last 168 hours. BNP: Invalid input(s): POCBNP CBG: No results for input(s): GLUCAP in the last 168 hours. D-Dimer No results for input(s): DDIMER in the last 72 hours. Hgb A1c No results for input(s): HGBA1C in the last 72 hours. Lipid Profile No results for input(s): CHOL, HDL, LDLCALC, TRIG, CHOLHDL, LDLDIRECT in the last 72 hours. Thyroid function studies No results for input(s): TSH, T4TOTAL, T3FREE, THYROIDAB in the last 72 hours.  Invalid input(s): FREET3 Anemia work up No results for input(s): VITAMINB12, FOLATE, FERRITIN, TIBC, IRON, RETICCTPCT in the last 72 hours. Urinalysis    Component Value Date/Time   COLORURINE AMBER (A) 07/10/2019 1635   APPEARANCEUR CLEAR 07/10/2019 1635   LABSPEC 1.027 07/10/2019 1635   PHURINE 5.0 07/10/2019 1635   GLUCOSEU NEGATIVE 07/10/2019 1635   HGBUR NEGATIVE 07/10/2019 1635   BILIRUBINUR NEGATIVE 07/10/2019 1635   KETONESUR NEGATIVE 07/10/2019 1635   PROTEINUR 100 (A) 07/10/2019 1635   UROBILINOGEN 1.0 08/06/2009 1001   NITRITE NEGATIVE 07/10/2019 1635   LEUKOCYTESUR NEGATIVE 07/10/2019 1635   Sepsis Labs Invalid input(s): PROCALCITONIN,  WBC,  LACTICIDVEN Microbiology Recent Results (from the past 240 hour(s))  Urine culture     Status: Abnormal   Collection Time: 07/10/19  4:35 PM   Specimen: Urine, Clean Catch  Result Value Ref Range  Status   Specimen Description   Final    URINE, CLEAN CATCH Performed at [facility], [place] [address] [place], [place] [address]    Special Requests   Final    NONE Performed at [facility], [place] [address], [place], [place] [address]    Culture MULTIPLE SPECIES PRESENT, SUGGEST RECOLLECTION (A)  Final   Report Status 07/12/2019 FINAL  Final  SARS Coronavirus 2 by RT PCR (hospital order, performed in [facility] hospital lab) Nasopharyngeal Nasopharyngeal Swab     Status: None   Collection Time: 07/10/19  7:44 PM   Specimen: Nasopharyngeal Swab  Result Value Ref Range Status   SARS Coronavirus 2 NEGATIVE NEGATIVE Final    Comment: (NOTE) SARS-CoV-2 target nucleic acids are NOT DETECTED. The SARS-CoV-2 RNA is generally detectable in upper and lower respiratory specimens during the acute phase of infection. The lowest concentration of SARS-CoV-2 viral copies this assay can detect is 250 copies / mL. A negative result does not preclude SARS-CoV-2 infection and should not be used as the sole basis for treatment or other patient management decisions.  A negative result may occur with improper specimen collection / handling, submission of specimen other than nasopharyngeal swab, presence of viral mutation(s) within the areas targeted by this assay, and inadequate number of viral copies (<250 copies / mL). A negative result must be combined with clinical observations, patient history, and epidemiological information. Fact Sheet for Patients:   StrictlyIdeas.no Fact Sheet  for Healthcare Providers: BankingDealers.co.za This test is not yet approved or cleared  by the [place] and has been authorized for detection and/or diagnosis of SARS-CoV-2 by FDA under an Emergency Use Authorization (EUA).  This EUA will remain in effect (meaning this test can be used) for the duration of the COVID-19  declaration under Section 564(b)(1) of the Act, 21 U.S.C. section 360bbb-3(b)(1), unless the authorization is terminated or revoked sooner. Performed at [facility], [place] [address] [place], [place] [address]   MRSA PCR Screening     Status: Abnormal   Collection Time: 07/11/19  7:43 PM   Specimen: Nasopharyngeal  Result Value Ref Range Status   MRSA by PCR POSITIVE (A) NEGATIVE Final    Comment:        The GeneXpert MRSA Assay (FDA approved for NASAL specimens only), is one component of a comprehensive MRSA colonization surveillance program. It is not intended to diagnose MRSA infection nor to guide or monitor treatment for MRSA infections. RESULT CALLED TO, READ BACK BY AND VERIFIED WITH: S.NESMITH AT 2220 ON 07/11/19 BY N.THOMPSON Performed at [facility], [place] [address], [place], [place] [address]      Time coordinating discharge: Over 30 minutes  SIGNED:   Darliss Cheney, MD  [facility] 07/18/2019, 1:05 PM  If 7PM-7AM, please contact night-coverage [email or web address]

## 2019-07-19 LAB — CBC WITH DIFFERENTIAL/PLATELET
Abs Immature Granulocytes: 0.03 10*3/uL (ref 0.00–0.07)
Basophils Absolute: 0 10*3/uL (ref 0.0–0.1)
Basophils Relative: 0 %
Eosinophils Absolute: 0 10*3/uL (ref 0.0–0.5)
Eosinophils Relative: 0 %
HCT: 40.2 % (ref 39.0–52.0)
Hemoglobin: 12.6 g/dL — ABNORMAL LOW (ref 13.0–17.0)
Immature Granulocytes: 0 %
Lymphocytes Relative: 5 %
Lymphs Abs: 0.4 10*3/uL — ABNORMAL LOW (ref 0.7–4.0)
MCH: 33.9 pg (ref 26.0–34.0)
MCHC: 31.3 g/dL (ref 30.0–36.0)
MCV: 108.1 fL — ABNORMAL HIGH (ref 80.0–100.0)
Monocytes Absolute: 0.4 10*3/uL (ref 0.1–1.0)
Monocytes Relative: 5 %
Neutro Abs: 7.6 10*3/uL (ref 1.7–7.7)
Neutrophils Relative %: 90 %
Platelets: 142 10*3/uL — ABNORMAL LOW (ref 150–400)
RBC: 3.72 MIL/uL — ABNORMAL LOW (ref 4.22–5.81)
RDW: 14.8 % (ref 11.5–15.5)
WBC: 8.5 10*3/uL (ref 4.0–10.5)
nRBC: 0 % (ref 0.0–0.2)

## 2019-07-19 LAB — BASIC METABOLIC PANEL
Anion gap: 11 (ref 5–15)
BUN: 53 mg/dL — ABNORMAL HIGH (ref 8–23)
CO2: 31 mmol/L (ref 22–32)
Calcium: 8.2 mg/dL — ABNORMAL LOW (ref 8.9–10.3)
Chloride: 98 mmol/L (ref 98–111)
Creatinine, Ser: 1.27 mg/dL — ABNORMAL HIGH (ref 0.61–1.24)
GFR calc Af Amer: 56 mL/min — ABNORMAL LOW (ref >60)
GFR calc non Af Amer: 48 mL/min — ABNORMAL LOW (ref >60)
Glucose, Bld: 159 mg/dL — ABNORMAL HIGH (ref 70–99)
Potassium: 3.5 mmol/L (ref 3.5–5.1)
Sodium: 140 mmol/L (ref 135–145)

## 2019-07-19 MED ORDER — APIXABAN 5 MG PO TABS
5.0000 mg | ORAL_TABLET | Freq: Two times a day (BID) | ORAL | Status: DC
  Administered 2019-07-19 – 2019-07-25 (×13): 5 mg via ORAL
  Filled 2019-07-19 (×13): qty 1

## 2019-07-19 MED ORDER — METOPROLOL SUCCINATE ER 25 MG PO TB24
25.0000 mg | ORAL_TABLET | Freq: Every day | ORAL | Status: DC
  Administered 2019-07-20 – 2019-07-25 (×6): 25 mg via ORAL
  Filled 2019-07-19 (×7): qty 1

## 2019-07-19 MED ORDER — AMIODARONE HCL 200 MG PO TABS
400.0000 mg | ORAL_TABLET | Freq: Two times a day (BID) | ORAL | Status: DC
  Administered 2019-07-19 – 2019-07-25 (×12): 400 mg via ORAL
  Filled 2019-07-19 (×12): qty 2

## 2019-07-19 NOTE — Telephone Encounter (Signed)
No problem will take care of it today.

## 2019-07-19 NOTE — TOC Progression Note (Signed)
Transition of Care [facility]) - Progression Note    Patient Details  Name: KENWOOD LAWHUN MRN: [id number] Date of Birth: 1924/11/12  [facility] of [facility] CM/SW Contact  Emonte Dieujuste, Marjie Skiff, RN Phone Number: 07/19/2019, 3:21 PM  Clinical Narrative:     Plan for dc is now SNF after conversation yesterday with pt and son. Bed offers and Medicare.gov list provided to son Clair Gulling. Awaiting bed choice at this time. TOC will continue to follow.   Expected Discharge Plan: Skilled Nursing Facility Barriers to Discharge: No Barriers Identified  Expected Discharge Plan and Services Expected Discharge Plan: [place]   Discharge Planning Services: CM Consult Post Acute Care Choice: Nursing Home Living arrangements for the past 2 months: Single Family Home Expected Discharge Date: 07/18/19                     Social Determinants of Health (SDOH) Interventions    Readmission Risk Interventions Readmission Risk Prevention Plan 07/19/2019  Transportation Screening Complete  PCP or Specialist Appt within 3-5 Days Complete  HRI or [place] Complete  Social Work Consult for [place] Planning/Counseling Complete  Palliative Care Screening Complete  Medication Review Press photographer) Complete  Some recent data might be hidden

## 2019-07-19 NOTE — Progress Notes (Addendum)
Progress Note  Patient Name: [name] [name] Date of Encounter: 07/19/2019  Primary Cardiologist: Larae Grooms, MD   Subjective   Weak, but looking at rehab places to go. Alert and oriented. No palpitations or SOB at rest.  Inpatient Medications    Scheduled Meds: . capsaicin   Topical BID  . chlorhexidine  15 mL Mouth Rinse BID  . Chlorhexidine Gluconate Cloth  6 each Topical Q0600  . feeding supplement (ENSURE ENLIVE)  237 mL Oral Q24H  . furosemide  40 mg Oral Daily  . mouth rinse  15 mL Mouth Rinse q12n4p  . mirtazapine  7.5 mg Oral QHS  . umeclidinium-vilanterol  1 puff Inhalation Daily   Continuous Infusions:  PRN Meds: acetaminophen **OR** acetaminophen, albuterol, antiseptic oral rinse, bisacodyl, diphenhydrAMINE, fluticasone, glycopyrrolate **OR** glycopyrrolate **OR** glycopyrrolate, haloperidol **OR** haloperidol **OR** haloperidol lactate, HYDROmorphone (DILAUDID) injection, LORazepam **OR** LORazepam **OR** LORazepam, ondansetron (ZOFRAN) IV, oxyCODONE **OR** oxyCODONE, polyvinyl alcohol, psyllium   Vital Signs    Vitals:   07/18/19 0629 07/18/19 0855 07/19/19 0926 07/19/19 1047  BP: 124/82   123/70  Pulse: 69   (!) 102  Resp: 16     Temp: 98.2 F (36.8 C)     TempSrc:      SpO2: 95% 94% 95%   Weight:      Height:        Intake/Output Summary (Last 24 hours) at 07/19/2019 1247 Last data filed at 07/19/2019 0715 Gross per 24 hour  Intake 240 ml  Output 1000 ml  Net -760 ml   Last 3 Weights 07/16/2019 07/15/2019 07/14/2019  Weight (lbs) 143 lb 15.4 oz 154 lb 8.7 oz 156 lb 1.4 oz  Weight (kg) 65.3 kg 70.1 kg 70.8 kg      Telemetry    Not on - Personally Reviewed  ECG    n/a - Personally Reviewed  Physical Exam   VS:  BP 123/70 (BP Location: Left Arm)   Pulse (!) 102   Temp 98.2 F (36.8 C)   Resp 16   Ht 5\' 10"  (1.778 m)   Wt 65.3 kg   SpO2 95%   BMI 20.66 kg/m  , BMI Body mass index is 20.66 kg/m. GEN: frail, elderly male, No  acute distress.   Neck: minimal JVD Cardiac: Irreg R&R, 2/6 murmur, no rubs, or gallops.  Respiratory: diminished to auscultation bilaterally with a few rales in the bases. GI: Soft, nontender, non-distended  MS: No edema; No deformity. Neuro:  Nonfocal  Psych: Normal affect     Labs    High Sensitivity Troponin:   Recent Labs  Lab 07/10/19 1620 07/10/19 1820  TROPONINIHS 68* 73*      Chemistry Recent Labs  Lab 07/14/19 0250 07/15/19 0303 07/16/19 0234  NA 135 140 140  K 3.8 3.8 3.7  CL 98 99 101  CO2 26 30 28   GLUCOSE 100* 95 108*  BUN 51* 60* 54*  CREATININE 1.31* 1.50* 1.33*  CALCIUM 8.2* 8.4* 8.2*  GFRNONAA 47* 40* 46*  GFRAA 54* 46* 53*  ANIONGAP 11 11 11      Hematology Recent Labs  Lab 07/14/19 0250 07/15/19 0303 07/16/19 0234  WBC 7.8 10.3 9.3  RBC 3.64* 3.94* 3.51*  HGB 12.1* 13.1 11.9*  HCT 40.7 43.1 39.0  MCV 111.8* 109.4* 111.1*  MCH 33.2 33.2 33.9  MCHC 29.7* 30.4 30.5  RDW 14.9 14.7 15.0  PLT 125* 136* 115*    BNP No results for input(s): BNP, PROBNP in  the last 168 hours.   DDimer  No results for input(s): DDIMER in the last 168 hours.   Radiology    No results found.  Cardiac Studies   Echo 07/11/2019:  1. Severely reduced LV function, 15-20%. LVOT VTI 8 cm. Left ventricular  ejection fraction, by estimation, is 15-20%. The left ventricle has  severely decreased function. The left ventricle demonstrates global  hypokinesis. Left ventricular diastolic  function could not be evaluated.  2. Right ventricular systolic function is moderately reduced. The right  ventricular size is moderately enlarged. There is moderately elevated  pulmonary artery systolic pressure. The estimated right ventricular  systolic pressure is [id number] mmHg.  3. Left atrial size was moderately dilated.  4. Right atrial size was moderately dilated.  5. The mitral valve is degenerative. Moderate to severe mitral valve  regurgitation. No evidence of  mitral stenosis.  6. The aortic valve is tricuspid and severely calcified with restricted  movement in systole. The gradients are quite low given the severely  reduced LV function (LVOT VTI 8 cm, SV=34 cc, SVi=18 cc/m2). AVA by VTI is  1.44 cm2 and DI is 0.34, consistent  with moderate aortic stenosis. If there are concerns for severe aortic  stenosis, would recommend an aortic valve calcium score for clarification.  The aortic valve is tricuspid. Aortic valve regurgitation is not  visualized. Moderate to severe aortic valve  stenosis. Aortic valve area, by VTI measures 1.44 cm. Aortic valve mean  gradient measures 5.0 mmHg. Aortic valve Vmax measures 1.50 m/s.   Patient Profile     86 y.o. male with sick sinus syndrome status post pacemaker, persistent atrial fibrillation,CKD 3,COPD, prior DVT and hypertensionadmitted with acute on chronic systolic and diastolic heart failure and atrial fibrillation with rapid ventricular response.  Assessment & Plan    # Persistent atrial fibrillation with RVR:  - felt to be continuous since 04/06 - will restart low-dose Toprol XL and amiodarone 400 mg BID - check BMET.  If Cr >1.5 will restart Eliquis 2.5 mg BID.  If <1.5, will start ELiquis 5 mg BID  - plan TEE/DCCV, likely Monday once loaded with more amio and back on Eliquis  # Acute systolic and diastolic heart failure:  - continue Lasix 40 mg po qd, may have to give an extra dose occasionally for increased volume  # Moderate AS/MR: - follow, do not want him too dry - monitor for any feeling of presyncope or lightheaded feelings  # Demand ischemia:  - HS trop peak 73 - no ischemic sx - elevated trop likely due to demand ischemia from elevated HR - no further workup indicated  Otherwise, per IM, ok for d/c  For questions or updates, please contact [facility] Please consult [email or web address] for contact info under        Signed, Rosaria Ferries, PA-C  07/19/2019, 12:46 PM      Patient seen and examined.  Agree with above documentation.  On exam, patient is alert and oriented, normal rate, irregular rhythm, 2/6 systolic murmur, lungs CTAB, no LE edema.  Had a long discussion with patient and his son.  He had initially opted for hospice care but states that he did not understand what hospice entails and now would like aggressive care.  Given this, recommend restarting Toprol-XL, amiodarone, and Eliquis.  Will need to check BMP to see what his renal function is, and that can guide whether to start Eliquis 5 mg or 2.5 mg BID.  Given his severe systolic  heart failure likely secondary to AF with RVR, recommend cardioversion.  Given that he has been off Eliquis for a few days now, he will need a TEE/DCCV.  Will plan for this likely on Monday after he has been loaded with amiodarone.  Discussed with Dr Doristine Bosworth, will transfer to telemetry bed so can monitor his rate/rhythm.  Will check EKG now.  Donato Heinz, MD

## 2019-07-19 NOTE — Progress Notes (Signed)
Report given to 4th floor RN.  Patient condition unchanged from dayshift. Patient has no complaints at this time. Family in room at time of transfer.  Patient transported via bed to 4th floor by transporter. Roderick Pee

## 2019-07-19 NOTE — Progress Notes (Signed)
PROGRESS NOTE  [name] [name] [id number] DOB: 11/28/1924 DOA: 07/10/2019 PCP: Lawerance Cruel, MD  HPI/Recap of past 24 hours:  [name] a 86 y.o.malewith a known history of Afib with PPM, CKD3, COPD,presented to the emergency department for evaluation of weakness, SOB, LE edema. Patient was in a usual state of health until about 10 days prior to admission when he started to become progressively weaker, with worsening LE edema and SOB.Lives at home with his wife who is 49, was ambulating with a walker, but now needs assistance. He did sustain a fall 3 weeks ago but recovered. In the ER he was found to be in afib with RVR, increased vascular congestion. Pt was given Lasix and started on a Cardizem drip. Foley cath put out 300cc dark yellow urine so far. Patient has been taking medication as prescribed and there has been no recent change in medication or diet. No recent antibiotics. There has been no recent illness, hospitalizations, travel or sick contacts. Patient admitted for further management.  TSH within normal limit.  He was started on amiodarone drip followed by p.o. amiodarone and Eliquis.  Cardiology was managing this.  BNP was elevated at 1038.  His pacemaker was interrogated which had about 78% burden of A. fib.  Troponin was slightly elevated.  Echo showed ejection fraction of 15 to 20% with global hypokinesis and moderately elevated pulmonary artery systolic pressure.  Looks like subsequently his amiodarone and beta-blocker were also stopped and cardiology recommended reducing his Eliquis to 2.5 mg twice daily based on his weight and renal function which was labile.  Subsequently patient elected for hospice.  Following that, he had a bed available at [facility] and in fact discharge order and paperwork was completed on 07/18/2019 and few hours after the fact, his son came in and rescinded the decision of hospice as patient had significantly improved over last 2 to 3  days and demanded further work-up and cardiology reconsultation.  Cardiology was reconsulted.   Assessment/Plan: Active Problems:   Atrial fibrillation with RVR (HCC)   Acute exacerbation of CHF (congestive heart failure) (HCC)   Stage 3b chronic kidney disease   Acute combined systolic and diastolic heart failure (HCC)   Hypotension   Palliative care by specialist   Goals of care, counseling/discussion   General weakness   Acute congestive heart failure (HCC)  Paroxysmal A. fib with RVR TSH WNL Status post amiodarone drip/p.o. amiodarone/Eliquis Cardiology consulted.  Patient was at one point in time on amiodarone drip followed by p.o. amiodarone and they had signed off since patient elected to go for hospice.  Per son's demand, cardiology was reconsulted yesterday on 07/18/2019 and they have seen patient today and have started him on Toprol-XL and have recommended to continue Eliquis.  Speaking to son, he wants to know more about why his father is not being considered for cardioversion as this was the plan just few days ago.  I referred him to speak to cardiology directly.  New onset acute systolic HF/moderate aortic stenosis/moderate aortic regurgitation/elevated troponin BNP elevated 1038 Likely cardiomyopathy due to A. fib as his pacemaker was interrogated and noted to have about 78% burden A. fib, been consistently in A. fib since May 22, 2019. Troponin mildly elevated with a flat trend, 68-73. Chest x-ray suggestive of vascular congestion Echo showed EF of 15 to 20%, global hypokinesis, moderately elevated pulmonary artery systolic pressure, [id number] mmhg, moderate to severe mitral valve regurgitation.  Continue Lasix per cardiology recommendation.  AKI  on CKD stage 3B: Patient's creatinine was at baseline on 07/16/2019 before he elected for hospice.  Will check labs per son's request.  Generalized weakness/debility/fall: Will need SNF.  Bilateral lower extremity edema/wound Lower  extremity Doppler negative for DVT Wound care consulted S/P PO doxycycline   Malnutrition Type:  Nutrition Problem: Increased nutrient needs Etiology: acute illness   Malnutrition Characteristics:  Signs/Symptoms: estimated needs   Nutrition Interventions:  Interventions: Ensure Enlive (each supplement provides 350kcal and 20 grams of protein), MVI    Estimated body mass index is 20.66 kg/m as calculated from the following:   Height as of this encounter: 5\' 10"  (1.778 m).   Weight as of this encounter: 65.3 kg.     Code Status: DNR  Family Communication: Discussed with son today  Disposition Plan: Status is: Inpatient  Remains inpatient appropriate because:Inpatient level of care appropriate due to severity of illness   Dispo: The patient is from: Home              Anticipated d/c is to: SNF              Anticipated d/c date is: 1 day              Patient currently is medically stable to d/c. to residential hospice Barriers to discharge: Son wants to talk to cardiology before he picks up his choice for SNF.   Consultants:  Cardiology  Palliative   Procedures:  None  Antimicrobials:  S/p Doxycycline  DVT prophylaxis: Eliquis  Subjective: Patient seen and examined.  He has no complaints.  Objective: Vitals:   07/18/19 0629 07/18/19 0855 07/19/19 0926 07/19/19 1047  BP: 124/82   123/70  Pulse: 69   (!) 102  Resp: 16     Temp: 98.2 F (36.8 C)     TempSrc:      SpO2: 95% 94% 95%   Weight:      Height:        Intake/Output Summary (Last 24 hours) at 07/19/2019 1354 Last data filed at 07/19/2019 0715 Gross per 24 hour  Intake 240 ml  Output 1000 ml  Net -760 ml   Filed Weights   07/14/19 0500 07/15/19 0500 07/16/19 0500  Weight: 70.8 kg 70.1 kg 65.3 kg    Exam:  General exam: Appears calm and comfortable  Respiratory system: Diminished at the bases. Respiratory effort normal. Cardiovascular system: S1 & S2 heard, irregularly regular  rate and rhythm. No JVD, murmurs, rubs, gallops or clicks. No pedal edema. Gastrointestinal system: Abdomen is nondistended, soft and nontender. No organomegaly or masses felt. Normal bowel sounds heard. Central nervous system: Alert and oriented. No focal neurological deficits. Extremities: Symmetric 5 x 5 power. Skin: No rashes, lesions or ulcers.  Psychiatry: Judgement and insight appear poor, mood & affect appropriate.    Data Reviewed: CBC: Recent Labs  Lab 07/13/19 0255 07/14/19 0250 07/15/19 0303 07/16/19 0234  WBC 10.4 7.8 10.3 9.3  NEUTROABS 9.3* 6.6 9.0* 8.5*  HGB 12.2* 12.1* 13.1 11.9*  HCT 40.1 40.7 43.1 39.0  MCV 112.0* 111.8* 109.4* 111.1*  PLT 126* 125* 136* [id number]*   Basic Metabolic Panel: Recent Labs  Lab 07/13/19 0255 07/14/19 0250 07/15/19 0303 07/16/19 0234  NA 137 135 140 140  K 4.0 3.8 3.8 3.7  CL 103 98 99 101  CO2 24 26 30 28   GLUCOSE 109* 100* 95 108*  BUN 48* 51* 60* 54*  CREATININE 1.35* 1.31* 1.50* 1.33*  CALCIUM 8.3* 8.2*  8.4* 8.2*   GFR: Estimated Creatinine Clearance: 32 mL/min (A) (by C-G formula based on SCr of 1.33 mg/dL (H)). Liver Function Tests: No results for input(s): AST, ALT, ALKPHOS, BILITOT, PROT, ALBUMIN in the last 168 hours. No results for input(s): LIPASE, AMYLASE in the last 168 hours. No results for input(s): AMMONIA in the last 168 hours. Coagulation Profile: No results for input(s): INR, PROTIME in the last 168 hours. Cardiac Enzymes: No results for input(s): CKTOTAL, CKMB, CKMBINDEX, TROPONINI in the last 168 hours. BNP (last 3 results) No results for input(s): PROBNP in the last 8760 hours. HbA1C: No results for input(s): HGBA1C in the last 72 hours. CBG: No results for input(s): GLUCAP in the last 168 hours. Lipid Profile: No results for input(s): CHOL, HDL, LDLCALC, TRIG, CHOLHDL, LDLDIRECT in the last 72 hours. Thyroid Function Tests: No results for input(s): TSH, T4TOTAL, FREET4, T3FREE, THYROIDAB in the  last 72 hours. Anemia Panel: No results for input(s): VITAMINB12, FOLATE, FERRITIN, TIBC, IRON, RETICCTPCT in the last 72 hours. Urine analysis:    Component Value Date/Time   COLORURINE AMBER (A) 07/10/2019 1635   APPEARANCEUR CLEAR 07/10/2019 1635   LABSPEC 1.027 07/10/2019 1635   PHURINE 5.0 07/10/2019 1635   GLUCOSEU NEGATIVE 07/10/2019 1635   HGBUR NEGATIVE 07/10/2019 1635   BILIRUBINUR NEGATIVE 07/10/2019 1635   KETONESUR NEGATIVE 07/10/2019 1635   PROTEINUR 100 (A) 07/10/2019 1635   UROBILINOGEN 1.0 08/06/2009 1001   NITRITE NEGATIVE 07/10/2019 1635   LEUKOCYTESUR NEGATIVE 07/10/2019 1635   Sepsis Labs: @LABRCNTIP (procalcitonin:4,lacticidven:4)  ) Recent Results (from the past 240 hour(s))  Urine culture     Status: Abnormal   Collection Time: 07/10/19  4:35 PM   Specimen: Urine, Clean Catch  Result Value Ref Range Status   Specimen Description   Final    URINE, CLEAN CATCH Performed at [facility], [place] [address], [place], [place] [address]    Special Requests   Final    NONE Performed at [facility], [place] [address] [place], [place] [address]    Culture MULTIPLE SPECIES PRESENT, SUGGEST RECOLLECTION (A)  Final   Report Status 07/12/2019 FINAL  Final  SARS Coronavirus 2 by RT PCR (hospital order, performed in [facility] hospital lab) Nasopharyngeal Nasopharyngeal Swab     Status: None   Collection Time: 07/10/19  7:44 PM   Specimen: Nasopharyngeal Swab  Result Value Ref Range Status   SARS Coronavirus 2 NEGATIVE NEGATIVE Final    Comment: (NOTE) SARS-CoV-2 target nucleic acids are NOT DETECTED. The SARS-CoV-2 RNA is generally detectable in upper and lower respiratory specimens during the acute phase of infection. The lowest concentration of SARS-CoV-2 viral copies this assay can detect is 250 copies / mL. A negative result does not preclude SARS-CoV-2 infection and should not be used as the sole basis for treatment or  other patient management decisions.  A negative result may occur with improper specimen collection / handling, submission of specimen other than nasopharyngeal swab, presence of viral mutation(s) within the areas targeted by this assay, and inadequate number of viral copies (<250 copies / mL). A negative result must be combined with clinical observations, patient history, and epidemiological information. Fact Sheet for Patients:   StrictlyIdeas.no Fact Sheet for Healthcare Providers: BankingDealers.co.za This test is not yet approved or cleared  by the [place] FDA and has been authorized for detection and/or diagnosis of SARS-CoV-2 by FDA under an Emergency Use Authorization (EUA).  This EUA will remain in effect (  meaning this test can be used) for the duration of the COVID-19 declaration under Section 564(b)(1) of the Act, 21 U.S.C. section 360bbb-3(b)(1), unless the authorization is terminated or revoked sooner. Performed at [facility], [place] [address], [place], [place] [address]   MRSA PCR Screening     Status: Abnormal   Collection Time: 07/11/19  7:43 PM   Specimen: Nasopharyngeal  Result Value Ref Range Status   MRSA by PCR POSITIVE (A) NEGATIVE Final    Comment:        The GeneXpert MRSA Assay (FDA approved for NASAL specimens only), is one component of a comprehensive MRSA colonization surveillance program. It is not intended to diagnose MRSA infection nor to guide or monitor treatment for MRSA infections. RESULT CALLED TO, READ BACK BY AND VERIFIED WITH: S.NESMITH AT 2220 ON 07/11/19 BY N.THOMPSON Performed at [facility], [place] [address] [place],  [address]       Studies: No results found.  Scheduled Meds: . capsaicin   Topical BID  . chlorhexidine  15 mL Mouth Rinse BID  . Chlorhexidine Gluconate Cloth  6 each Topical Q0600  . feeding supplement (ENSURE  ENLIVE)  237 mL Oral Q24H  . furosemide  40 mg Oral Daily  . mouth rinse  15 mL Mouth Rinse q12n4p  . metoprolol succinate  25 mg Oral Daily  . mirtazapine  7.5 mg Oral QHS  . umeclidinium-vilanterol  1 puff Inhalation Daily    Continuous Infusions:    LOS: 9 days     Darliss Cheney, MD [facility]  If 7PM-7AM, please contact night-coverage [email or web address] 07/19/2019, 1:54 PM

## 2019-07-19 NOTE — Telephone Encounter (Signed)
[name] [name], pt's wife asked that we mail them the lab results from their visit with Dr. Jaynee Eagles. I was going to have pt sign release and provide these at his [facility] last Thursday but the appt was canceled. Do you mind getting in touch with wife and providing results in mail?

## 2019-07-20 LAB — CBC
HCT: 40.3 % (ref 39.0–52.0)
Hemoglobin: 12.4 g/dL — ABNORMAL LOW (ref 13.0–17.0)
MCH: 34.1 pg — ABNORMAL HIGH (ref 26.0–34.0)
MCHC: 30.8 g/dL (ref 30.0–36.0)
MCV: 110.7 fL — ABNORMAL HIGH (ref 80.0–100.0)
Platelets: 122 10*3/uL — ABNORMAL LOW (ref 150–400)
RBC: 3.64 MIL/uL — ABNORMAL LOW (ref 4.22–5.81)
RDW: 14.6 % (ref 11.5–15.5)
WBC: 7.3 10*3/uL (ref 4.0–10.5)
nRBC: 0 % (ref 0.0–0.2)

## 2019-07-20 LAB — BASIC METABOLIC PANEL
Anion gap: 10 (ref 5–15)
BUN: 46 mg/dL — ABNORMAL HIGH (ref 8–23)
CO2: 30 mmol/L (ref 22–32)
Calcium: 8 mg/dL — ABNORMAL LOW (ref 8.9–10.3)
Chloride: 100 mmol/L (ref 98–111)
Creatinine, Ser: 1.09 mg/dL (ref 0.61–1.24)
GFR calc Af Amer: >60 mL/min (ref >60)
GFR calc non Af Amer: 58 mL/min — ABNORMAL LOW (ref >60)
Glucose, Bld: 91 mg/dL (ref 70–99)
Potassium: 3.7 mmol/L (ref 3.5–5.1)
Sodium: 140 mmol/L (ref 135–145)

## 2019-07-20 NOTE — Progress Notes (Addendum)
Progress Note  Patient Name: [name] [name] Date of Encounter: 07/20/2019  Primary Cardiologist: Larae Grooms, MD   Subjective   Denies shortness of breath, no palpitations or feeling lightheaded.  Thinks he is supposed to go to a rehab facility today.  Inpatient Medications    Scheduled Meds: . amiodarone  400 mg Oral BID  . apixaban  5 mg Oral BID  . capsaicin   Topical BID  . chlorhexidine  15 mL Mouth Rinse BID  . Chlorhexidine Gluconate Cloth  6 each Topical Q0600  . feeding supplement (ENSURE ENLIVE)  237 mL Oral Q24H  . furosemide  40 mg Oral Daily  . mouth rinse  15 mL Mouth Rinse q12n4p  . metoprolol succinate  25 mg Oral Daily  . mirtazapine  7.5 mg Oral QHS  . umeclidinium-vilanterol  1 puff Inhalation Daily   Continuous Infusions:  PRN Meds: acetaminophen **OR** acetaminophen, albuterol, antiseptic oral rinse, bisacodyl, diphenhydrAMINE, fluticasone, glycopyrrolate **OR** glycopyrrolate **OR** glycopyrrolate, haloperidol **OR** haloperidol **OR** haloperidol lactate, HYDROmorphone (DILAUDID) injection, LORazepam **OR** LORazepam **OR** LORazepam, ondansetron (ZOFRAN) IV, oxyCODONE **OR** oxyCODONE, polyvinyl alcohol, psyllium   Vital Signs    Vitals:   07/19/19 1512 07/19/19 1517 07/19/19 2023 07/20/19 0548  BP: 115/78  116/75 115/89  Pulse: (!) 43 60 (!) 106 (!) 53  Resp:   16 18  Temp: 97.6 F (36.4 C)  98.5 F (36.9 C) 97.6 F (36.4 C)  TempSrc: Oral  Oral   SpO2: 96%  100% 95%  Weight:      Height:        Intake/Output Summary (Last 24 hours) at 07/20/2019 0923 Last data filed at 07/19/2019 1800 Gross per 24 hour  Intake 120 ml  Output 500 ml  Net -380 ml   Last 3 Weights 07/16/2019 07/15/2019 07/14/2019  Weight (lbs) 143 lb 15.4 oz 154 lb 8.7 oz 156 lb 1.4 oz  Weight (kg) 65.3 kg 70.1 kg 70.8 kg      Telemetry    AFIB, rate ok, PVCs and pairs - Personally Reviewed  ECG    n/a - Personally Reviewed  Physical Exam   VS:  BP  115/89 (BP Location: Right Wrist)   Pulse (!) 53   Temp 97.6 F (36.4 C)   Resp 18   Ht 5\' 10"  (1.778 m)   Wt 65.3 kg   SpO2 95%   BMI 20.66 kg/m  , BMI Body mass index is 20.66 kg/m.  GEN: No acute distress.   Neck: No JVD Cardiac:  Irregular rate and rhythm, 2/6 murmur, no rubs, or gallops.  Respiratory: diminished to auscultation bilaterally  GI: Soft, nontender, non-distended  MS:  Trace edema right foot; No deformity. Neuro:  Nonfocal  Psych: Normal affect   Labs    High Sensitivity Troponin:   Recent Labs  Lab 07/10/19 1620 07/10/19 1820  TROPONINIHS 68* 73*      Chemistry Recent Labs  Lab 07/16/19 0234 07/19/19 1411 07/20/19 0405  NA 140 140 140  K 3.7 3.5 3.7  CL 101 98 100  CO2 28 31 30   GLUCOSE 108* 159* 91  BUN 54* 53* 46*  CREATININE 1.33* 1.27* 1.09  CALCIUM 8.2* 8.2* 8.0*  GFRNONAA 46* 48* 58*  GFRAA 53* 56* >60  ANIONGAP 11 11 10      Hematology Recent Labs  Lab 07/16/19 0234 07/19/19 1411 07/20/19 0405  WBC 9.3 8.5 7.3  RBC 3.51* 3.72* 3.64*  HGB 11.9* 12.6* 12.4*  HCT 39.0 40.2  40.3  MCV 111.1* 108.1* 110.7*  MCH 33.9 33.9 34.1*  MCHC 30.5 31.3 30.8  RDW 15.0 14.8 14.6  PLT 115* 142* 122*    BNP No results for input(s): BNP, PROBNP in the last 168 hours.    Radiology    No results found.  Cardiac Studies   Echo 07/11/2019:  1. Severely reduced LV function, 15-20%. LVOT VTI 8 cm. Left ventricular  ejection fraction, by estimation, is 15-20%. The left ventricle has  severely decreased function. The left ventricle demonstrates global  hypokinesis. Left ventricular diastolic  function could not be evaluated.  2. Right ventricular systolic function is moderately reduced. The right  ventricular size is moderately enlarged. There is moderately elevated  pulmonary artery systolic pressure. The estimated right ventricular  systolic pressure is 81.1 mmHg.  3. Left atrial size was moderately dilated.  4. Right atrial  size was moderately dilated.  5. The mitral valve is degenerative. Moderate to severe mitral valve  regurgitation. No evidence of mitral stenosis.  6. The aortic valve is tricuspid and severely calcified with restricted  movement in systole. The gradients are quite low given the severely  reduced LV function (LVOT VTI 8 cm, SV=34 cc, SVi=18 cc/m2). AVA by VTI is  1.44 cm2 and DI is 0.34, consistent  with moderate aortic stenosis. If there are concerns for severe aortic  stenosis, would recommend an aortic valve calcium score for clarification.  The aortic valve is tricuspid. Aortic valve regurgitation is not  visualized. Moderate to severe aortic valve  stenosis. Aortic valve area, by VTI measures 1.44 cm. Aortic valve mean  gradient measures 5.0 mmHg. Aortic valve Vmax measures 1.50 m/s.   Patient Profile     86 y.o. male with sick sinus syndrome status post pacemaker, persistent atrial fibrillation,CKD 3,COPD, prior DVT and hypertensionadmitted with acute on chronic systolic and diastolic heart failure and atrial fibrillation with rapid ventricular response.  Assessment & Plan    # Persistent atrial fibrillation with RVR:  - continuous since 05/22/2019 -Found to have new systolic heart failure, DCCV was planned.  Patient decided to go to hospice so DCCV was cancelled and Eliquis was held.  Yesterday in discussion with his son he decided he did not want hospice but wanted to continue aggressive care, he was restarted on amio 400 mg twice daily, Eliquis 5 mg daily, and Toprol-XL 25 mg daily -Plan TEE/DCCV Monday, cannot just do cardioversion because he was off Eliquis for several days  # Acute systolic and diastolic heart failure:  -No weight since 5/31, at that time he was 144 pounds -On Lasix 40 mg daily -Renal function improved during the time that he was off Lasix, this will need to be followed  # Moderate AS/MR: -No symptoms from this at this time  # Demand ischemia:   -Peak troponin 73 -No ischemic symptoms -No additional work-up indicated  Otherwise, per IM For questions or updates, please contact [place] Please consult [email or web address] for contact info under        Signed, Rosaria Ferries, PA-C  07/20/2019, 9:23 AM     Patient seen and examined.  Agree with above documentation.  On exam, patient is alert and oriented, normal rate, irregular rhythm, 2/6 systolic murmur, lungs CTAB, no LE edema, JVD to just above the clavicle while sitting upright.  Telemetry personally reviewed, shows atrial fibrillation with rates 70s to 90s, PVCs.  AF rates appear controlled on Toprol-XL 25 mg.  Given his acute systolic heart failure with  EF 15-20% likely due to AF, recommend trying to restore sinus rhythm.  Will continue amiodarone load with 400 mg twice daily through the weekend and plan for TEE/DCCV on Monday.   Donato Heinz, MD

## 2019-07-20 NOTE — Progress Notes (Signed)
PROGRESS NOTE  [name] [name] DHR:[id number] DOB: 1924/07/19 DOA: 07/10/2019 PCP: Lawerance Cruel, MD  Brief summary:  [name] a 86 y.o.malewith a known history of Afib with PPM, CKD3, COPD,presented to the emergency department for evaluation of weakness, SOB, LE edema. Patient was in a usual state of health until about 10 days prior to admission when he started to become progressively weaker, with worsening LE edema and SOB.Lives at home with his wife who is 41, was ambulating with a walker, but now needs assistance. He did sustain a fall 3 weeks ago but recovered. In the ER he was found to be in afib with RVR, increased vascular congestion. Pt was given Lasix and started on a Cardizem drip. Foley cath put out 300cc dark yellow urine so far. Patient has been taking medication as prescribed and there has been no recent change in medication or diet. No recent antibiotics. There has been no recent illness, hospitalizations, travel or sick contacts. Patient admitted for further management.  TSH within normal limit.  He was started on amiodarone drip followed by p.o. amiodarone and Eliquis.  Cardiology was managing this.  BNP was elevated at 1038.  His pacemaker was interrogated which had about 78% burden of A. fib.  Troponin was slightly elevated.  Echo showed ejection fraction of 15 to 20% with global hypokinesis and moderately elevated pulmonary artery systolic pressure.  Looks like subsequently his amiodarone and beta-blocker were also stopped and cardiology recommended reducing his Eliquis to 2.5 mg twice daily based on his weight and renal function which was labile.  Subsequently patient elected for hospice.  Following that, he had a bed available at [facility] and in fact discharge order and paperwork was completed on 07/18/2019 and few hours after the fact, his son came in and rescinded the decision of hospice as patient had significantly improved over last 2 to 3 days and  demanded further work-up and cardiology reconsultation.  Cardiology was reconsulted.   Assessment/Plan: Active Problems:   Atrial fibrillation with RVR (HCC)   Acute exacerbation of CHF (congestive heart failure) (HCC)   Stage 3b chronic kidney disease   Acute combined systolic and diastolic heart failure (HCC)   Hypotension   Palliative care by specialist   Goals of care, counseling/discussion   General weakness   Acute congestive heart failure (HCC)   Paroxysmal A. fib with RVR TSH WNL Cardiology consulted.  Patient was at one point in time on amiodarone drip followed by p.o. amiodarone and Eliquis and they had signed off since patient elected to go for hospice.  Patient was in fact discharged to [facility] but after all that was done, patient's son had rescinded hospice/comfort care plans and per son's demand, cardiology was reconsulted on 07/18/2019 and they started him on Toprol-XL and amiodarone and have recommended to continue Eliquis.  Cardiology plans on doing TEE/cardioversion on Monday.  New onset acute systolic HF/moderate aortic stenosis/moderate aortic regurgitation/elevated troponin BNP elevated 1038 Likely cardiomyopathy due to A. fib as his pacemaker was interrogated and noted to have about 78% burden A. fib, been consistently in A. fib since May 22, 2019. Troponin mildly elevated with a flat trend, 68-73. Chest x-ray suggestive of vascular congestion Echo showed EF of 15 to 20%, global hypokinesis, moderately elevated pulmonary artery systolic pressure, 46.8 mmhg, moderate to severe mitral valve regurgitation.  Continue Lasix per cardiology recommendation.  AKI on CKD stage 3B: Patient's creatinine was at baseline on 07/16/2019 before he elected for hospice.  Repeat renal function on BMP has improved much better than his baseline since then.  Generalized weakness/debility/fall: Will need SNF.  Bilateral lower extremity edema/wound Lower extremity Doppler negative for  DVT Wound care consulted S/P PO doxycycline  Malnutrition Type:  Nutrition Problem: Increased nutrient needs Etiology: acute illness   Malnutrition Characteristics:  Signs/Symptoms: estimated needs   Nutrition Interventions:  Interventions: Ensure Enlive (each supplement provides 350kcal and 20 grams of protein), MVI    Estimated body mass index is 20.66 kg/m as calculated from the following:   Height as of this encounter: 5\' 10"  (1.778 m).   Weight as of this encounter: 65.3 kg.     Code Status: DNR  Family Communication: Discussed with son yesterday on 07/19/2019.  Disposition Plan: Status is: Inpatient  Remains inpatient appropriate because:Inpatient level of care appropriate due to severity of illness   Dispo: The patient is from: Home              Anticipated d/c is to: SNF              Anticipated d/c date is: 3 to 4 days              Patient currently is not medically stable for DC Barriers to discharge: Pending TEE cardioversion planned on Monday.   Consultants:  Cardiology  Palliative   Procedures:  None  Antimicrobials:  S/p Doxycycline  DVT prophylaxis: Eliquis  Subjective: Patient seen and examined.  No family member present at bedside patient has no complaints.  He remains alert and oriented.  Objective: Vitals:   07/19/19 1512 07/19/19 1517 07/19/19 2023 07/20/19 0548  BP: 115/78  116/75 115/89  Pulse: (!) 43 60 (!) 106 (!) 53  Resp:   16 18  Temp: 97.6 F (36.4 C)  98.5 F (36.9 C) 97.6 F (36.4 C)  TempSrc: Oral  Oral   SpO2: 96%  100% 95%  Weight:      Height:        Intake/Output Summary (Last 24 hours) at 07/20/2019 1052 Last data filed at 07/19/2019 1800 Gross per 24 hour  Intake 120 ml  Output 500 ml  Net -380 ml   Filed Weights   07/14/19 0500 07/15/19 0500 07/16/19 0500  Weight: 70.8 kg 70.1 kg 65.3 kg    Exam:  General exam: Appears calm and comfortable  Respiratory system: Clear to auscultation. Respiratory  effort normal. Cardiovascular system: S1 & S2 heard, irregularly irregular rate and rhythm. No JVD, murmurs, rubs, gallops or clicks. No pedal edema. Gastrointestinal system: Abdomen is nondistended, soft and nontender. No organomegaly or masses felt. Normal bowel sounds heard. Central nervous system: Alert and oriented. No focal neurological deficits. Extremities: Symmetric 5 x 5 power. Skin: No rashes, lesions or ulcers.  Psychiatry: Judgement and insight appear normal. Mood & affect appropriate.   Data Reviewed: CBC: Recent Labs  Lab 07/14/19 0250 07/15/19 0303 07/16/19 0234 07/19/19 1411 07/20/19 0405  WBC 7.8 10.3 9.3 8.5 7.3  NEUTROABS 6.6 9.0* 8.5* 7.6  --   HGB 12.1* 13.1 11.9* 12.6* 12.4*  HCT 40.7 43.1 39.0 40.2 40.3  MCV 111.8* 109.4* 111.1* 108.1* 110.7*  PLT 125* 136* 115* 142* 353*   Basic Metabolic Panel: Recent Labs  Lab 07/14/19 0250 07/15/19 0303 07/16/19 0234 07/19/19 1411 07/20/19 0405  NA 135 140 140 140 140  K 3.8 3.8 3.7 3.5 3.7  CL 98 99 101 98 100  CO2 26 30 28 31 30   GLUCOSE 100* 95  108* 159* 91  BUN 51* 60* 54* 53* 46*  CREATININE 1.31* 1.50* 1.33* 1.27* 1.09  CALCIUM 8.2* 8.4* 8.2* 8.2* 8.0*   GFR: Estimated Creatinine Clearance: 39.1 mL/min (by C-G formula based on SCr of 1.09 mg/dL). Liver Function Tests: No results for input(s): AST, ALT, ALKPHOS, BILITOT, PROT, ALBUMIN in the last 168 hours. No results for input(s): LIPASE, AMYLASE in the last 168 hours. No results for input(s): AMMONIA in the last 168 hours. Coagulation Profile: No results for input(s): INR, PROTIME in the last 168 hours. Cardiac Enzymes: No results for input(s): CKTOTAL, CKMB, CKMBINDEX, TROPONINI in the last 168 hours. BNP (last 3 results) No results for input(s): PROBNP in the last 8760 hours. HbA1C: No results for input(s): HGBA1C in the last 72 hours. CBG: No results for input(s): GLUCAP in the last 168 hours. Lipid Profile: No results for input(s): CHOL,  HDL, LDLCALC, TRIG, CHOLHDL, LDLDIRECT in the last 72 hours. Thyroid Function Tests: No results for input(s): TSH, T4TOTAL, FREET4, T3FREE, THYROIDAB in the last 72 hours. Anemia Panel: No results for input(s): VITAMINB12, FOLATE, FERRITIN, TIBC, IRON, RETICCTPCT in the last 72 hours. Urine analysis:    Component Value Date/Time   COLORURINE AMBER (A) 07/10/2019 1635   APPEARANCEUR CLEAR 07/10/2019 1635   LABSPEC 1.027 07/10/2019 1635   PHURINE 5.0 07/10/2019 1635   GLUCOSEU NEGATIVE 07/10/2019 1635   HGBUR NEGATIVE 07/10/2019 1635   BILIRUBINUR NEGATIVE 07/10/2019 1635   KETONESUR NEGATIVE 07/10/2019 1635   PROTEINUR 100 (A) 07/10/2019 1635   UROBILINOGEN 1.0 08/06/2009 1001   NITRITE NEGATIVE 07/10/2019 1635   LEUKOCYTESUR NEGATIVE 07/10/2019 1635   Sepsis Labs: @LABRCNTIP (procalcitonin:4,lacticidven:4)  ) Recent Results (from the past 240 hour(s))  Urine culture     Status: Abnormal   Collection Time: 07/10/19  4:35 PM   Specimen: Urine, Clean Catch  Result Value Ref Range Status   Specimen Description   Final    URINE, CLEAN CATCH Performed at [facility], [place] [address], [place], [place] [address]    Special Requests   Final    NONE Performed at [facility], [place] [address] [place], [place] [address]    Culture MULTIPLE SPECIES PRESENT, SUGGEST RECOLLECTION (A)  Final   Report Status 07/12/2019 FINAL  Final  SARS Coronavirus 2 by RT PCR (hospital order, performed in [facility] hospital lab) Nasopharyngeal Nasopharyngeal Swab     Status: None   Collection Time: 07/10/19  7:44 PM   Specimen: Nasopharyngeal Swab  Result Value Ref Range Status   SARS Coronavirus 2 NEGATIVE NEGATIVE Final    Comment: (NOTE) SARS-CoV-2 target nucleic acids are NOT DETECTED. The SARS-CoV-2 RNA is generally detectable in upper and lower respiratory specimens during the acute phase of infection. The lowest concentration of SARS-CoV-2 viral copies  this assay can detect is 250 copies / mL. A negative result does not preclude SARS-CoV-2 infection and should not be used as the sole basis for treatment or other patient management decisions.  A negative result may occur with improper specimen collection / handling, submission of specimen other than nasopharyngeal swab, presence of viral mutation(s) within the areas targeted by this assay, and inadequate number of viral copies (<250 copies / mL). A negative result must be combined with clinical observations, patient history, and epidemiological information. Fact Sheet for Patients:   StrictlyIdeas.no Fact Sheet for Healthcare Providers: BankingDealers.co.za This test is not yet approved or cleared  by the [place] FDA and has been authorized for  detection and/or diagnosis of SARS-CoV-2 by FDA under an Emergency Use Authorization (EUA).  This EUA will remain in effect (meaning this test can be used) for the duration of the COVID-19 declaration under Section 564(b)(1) of the Act, 21 U.S.C. section 360bbb-3(b)(1), unless the authorization is terminated or revoked sooner. Performed at [facility], [place] [address] [place], [place] [address]   MRSA PCR Screening     Status: Abnormal   Collection Time: 07/11/19  7:43 PM   Specimen: Nasopharyngeal  Result Value Ref Range Status   MRSA by PCR POSITIVE (A) NEGATIVE Final    Comment:        The GeneXpert MRSA Assay (FDA approved for NASAL specimens only), is one component of a comprehensive MRSA colonization surveillance program. It is not intended to diagnose MRSA infection nor to guide or monitor treatment for MRSA infections. RESULT CALLED TO, READ BACK BY AND VERIFIED WITH: S.NESMITH AT 2220 ON 07/11/19 BY N.THOMPSON Performed at [facility], [place] [address], [place], [place] [address]       Studies: No results found.  Scheduled Meds: .  amiodarone  400 mg Oral BID  . apixaban  5 mg Oral BID  . capsaicin   Topical BID  . chlorhexidine  15 mL Mouth Rinse BID  . Chlorhexidine Gluconate Cloth  6 each Topical Q0600  . feeding supplement (ENSURE ENLIVE)  237 mL Oral Q24H  . furosemide  40 mg Oral Daily  . mouth rinse  15 mL Mouth Rinse q12n4p  . metoprolol succinate  25 mg Oral Daily  . mirtazapine  7.5 mg Oral QHS  . umeclidinium-vilanterol  1 puff Inhalation Daily    Continuous Infusions:    LOS: 10 days     Darliss Cheney, MD [facility]  If 7PM-7AM, please contact night-coverage [email or web address] 07/20/2019, 10:52 AM

## 2019-07-20 NOTE — Care Management Important Message (Signed)
Important Message  Patient Details  Name: [name] [name] MRN: [id number] Date of Birth: October 26, 1924   Medicare Important Message Given:  Yes  Document has been given to [place], NCM  to give to the patient.  Crista Luria 07/20/2019, 10:53 AM

## 2019-07-21 LAB — BASIC METABOLIC PANEL
Anion gap: 8 (ref 5–15)
BUN: 45 mg/dL — ABNORMAL HIGH (ref 8–23)
CO2: 30 mmol/L (ref 22–32)
Calcium: 7.7 mg/dL — ABNORMAL LOW (ref 8.9–10.3)
Chloride: 98 mmol/L (ref 98–111)
Creatinine, Ser: 1.08 mg/dL (ref 0.61–1.24)
GFR calc Af Amer: >60 mL/min (ref >60)
GFR calc non Af Amer: 59 mL/min — ABNORMAL LOW (ref >60)
Glucose, Bld: 100 mg/dL — ABNORMAL HIGH (ref 70–99)
Potassium: 3.5 mmol/L (ref 3.5–5.1)
Sodium: 136 mmol/L (ref 135–145)

## 2019-07-21 MED ORDER — PREDNISONE 10 MG PO TABS
10.0000 mg | ORAL_TABLET | Freq: Every day | ORAL | Status: DC
  Administered 2019-07-22 – 2019-07-25 (×3): 10 mg via ORAL
  Filled 2019-07-21 (×3): qty 1

## 2019-07-21 MED ORDER — POTASSIUM CHLORIDE CRYS ER 20 MEQ PO TBCR
40.0000 meq | EXTENDED_RELEASE_TABLET | Freq: Once | ORAL | Status: AC
  Administered 2019-07-21: 40 meq via ORAL
  Filled 2019-07-21: qty 2

## 2019-07-21 MED ORDER — IPRATROPIUM-ALBUTEROL 0.5-2.5 (3) MG/3ML IN SOLN: 3.0000 mL | RESPIRATORY_TRACT | Status: DC | PRN

## 2019-07-21 MED ORDER — SODIUM CHLORIDE 0.9% FLUSH
10.0000 mL | INTRAVENOUS | Status: DC | PRN
  Administered 2019-07-21: 10 mL

## 2019-07-21 MED ORDER — POLYETHYLENE GLYCOL 3350 17 G PO PACK: 17.0000 g | PACK | Freq: Every day | ORAL | Status: DC | PRN

## 2019-07-21 MED ORDER — SENNOSIDES-DOCUSATE SODIUM 8.6-50 MG PO TABS: 2.0000 | ORAL_TABLET | Freq: Every evening | ORAL | Status: DC | PRN

## 2019-07-21 MED ORDER — SODIUM CHLORIDE 0.9% FLUSH
10.0000 mL | Freq: Two times a day (BID) | INTRAVENOUS | Status: DC
  Administered 2019-07-21 – 2019-07-25 (×3): 10 mL

## 2019-07-21 MED ORDER — ACETAMINOPHEN 325 MG PO TABS
650.0000 mg | ORAL_TABLET | Freq: Four times a day (QID) | ORAL | Status: DC | PRN
  Administered 2019-07-21 – 2019-07-24 (×3): 650 mg via ORAL
  Filled 2019-07-21 (×3): qty 2

## 2019-07-21 NOTE — Progress Notes (Signed)
Pt had a few runs of wide QRS beats this afternoon, but paced himself out of it per tele. Asymptomatic. MD Amin aware.

## 2019-07-21 NOTE — Progress Notes (Signed)
RN notified PCP on call for an order for Tylenol, as per patient's request for pain meds to help patient's arthritis.

## 2019-07-21 NOTE — Progress Notes (Signed)
PROGRESS NOTE    MEKO MASTERSON  FUX:[id number] DOB: [birth year] DOA: 07/10/2019 PCP: Lawerance Cruel, MD   Brief Narrative:  86 year old with history of A. fib with pacemaker, CKD stage III, COPD presented with weakness, shortness of breath and lower extremity edema.  ER found to have pulmonary vascular congestion, atrial fibrillation with RVR started on Lasix and Cardizem.  Pacemaker was interrogated which showed 78% burden of A. fib.  Echocardiogram showed an EF of 15-20% with global hypokinesis.  Amiodarone and beta-blocker was stopped, Eliquis reduced to 2.5 mg.  Patient was going to be discharged.  Hospice facility on 6/21 but son rescinded hospice decision and wants for cardiology evaluation.   Assessment & Plan:   Active Problems:   Atrial fibrillation with RVR (HCC)   Acute exacerbation of CHF (congestive heart failure) (HCC)   Stage 3b chronic kidney disease   Acute combined systolic and diastolic heart failure (HCC)   Hypotension   Palliative care by specialist   Goals of care, counseling/discussion   General weakness   Acute congestive heart failure (HCC)   Persistent A. fib with RVR -Cardiology team following.  Plans for TEE/cardioversion on Monday. -Amiodarone 400 mg twice daily, Eliquis 5 mg, Toprol-XL 25 mg daily -Replete electrolytes as necessary.  Acute congestive heart failure with reduced ejection fraction, class III. -This is complicated by atrial fibrillation with RVR.  Pacemaker showed 78% A. fib burden.  Echocardiogram showed EF 15-20% with global hypokinesis, elevated pulmonary arterial pressure, severe MR -Lasix 40 mg p.o. daily.  Monitor renal function -Cardiology following.  AKI on CKD stage 3B:  -Creatinine peaked at 1.5; has trended down to 1.08.  Continue to monitor.  Generalized weakness/debility/fall; Spinal Stenosis.  PT/OT  Bilateral lower extremity edema/wound Lower extremity Dopplers negative for DVT.  Status post  doxycycline  Iron deficiency anemia -Iron supplements, bowel regimen.  GERD -PPI  History of COPD -As needed bronchodilators, daily prednisone.  DVT prophylaxis: Eliquis Code Status: DNR Family Communication:  Spoke Dr Tressia Miners.   Status is: Inpatient  Remains inpatient appropriate because:IV treatments appropriate due to intensity of illness or inability to take PO   Dispo: The patient is from: Home              Anticipated d/c is to: Home              Anticipated d/c date is: 2 days              Patient currently is not medically stable to d/c.  Patient requires cardioversion on Monday.  Maintain hospital stay until cleared by cardiology in the meantime.  Subjective: Seen at bedside, doing ok. No complaints. Still in A fib.   Review of Systems Otherwise negative except as per HPI, including: General: Denies fever, chills, night sweats or unintended weight loss. Resp: Denies cough, wheezing, shortness of breath. Cardiac: Denies chest pain, palpitations, orthopnea, paroxysmal nocturnal dyspnea. GI: Denies abdominal pain, nausea, vomiting, diarrhea or constipation GU: Denies dysuria, frequency, hesitancy or incontinence MS: Denies muscle aches, joint pain or swelling Neuro: Denies headache, neurologic deficits (focal weakness, numbness, tingling), abnormal gait Psych: Denies anxiety, depression, SI/HI/AVH Skin: Denies new rashes or lesions ID: Denies sick contacts, exotic exposures, travel  Examination:  General exam: Appears calm and comfortable  Respiratory system: Clear to auscultation. Respiratory effort normal. Cardiovascular system: IRRR.  Gastrointestinal system: Abdomen is nondistended, soft and nontender. No organomegaly or masses felt. Normal bowel sounds heard. Central nervous system: Alert and oriented. No  focal neurological deficits. Extremities: Symmetric 4 x 5 power. Skin: No rashes, lesions or ulcers Psychiatry: Judgement and insight appear normal.  Mood & affect appropriate.     Objective: Vitals:   07/21/19 0434 07/21/19 0600 07/21/19 0813 07/21/19 0815  BP: 99/65     Pulse: 71     Resp: 14     Temp: 97.7 F (36.5 C)     TempSrc: Oral     SpO2: 94%  93% 93%  Weight:  69.5 kg    Height:        Intake/Output Summary (Last 24 hours) at 07/21/2019 0845 Last data filed at 07/21/2019 0434 Gross per 24 hour  Intake 720 ml  Output 900 ml  Net -180 ml   Filed Weights   07/15/19 0500 07/16/19 0500 07/21/19 0600  Weight: 70.1 kg 65.3 kg 69.5 kg     Data Reviewed:   CBC: Recent Labs  Lab 07/15/19 0303 07/16/19 0234 07/19/19 1411 07/20/19 0405  WBC 10.3 9.3 8.5 7.3  NEUTROABS 9.0* 8.5* 7.6  --   HGB 13.1 11.9* 12.6* 12.4*  HCT 43.1 39.0 40.2 40.3  MCV 109.4* 111.1* 108.1* 110.7*  PLT 136* 115* 142* 440*   Basic Metabolic Panel: Recent Labs  Lab 07/15/19 0303 07/16/19 0234 07/19/19 1411 07/20/19 0405 07/21/19 0424  NA 140 140 140 140 136  K 3.8 3.7 3.5 3.7 3.5  CL 99 101 98 100 98  CO2 30 28 31 30 30   GLUCOSE 95 108* 159* 91 100*  BUN 60* 54* 53* 46* 45*  CREATININE 1.50* 1.33* 1.27* 1.09 1.08  CALCIUM 8.4* 8.2* 8.2* 8.0* 7.7*   GFR: Estimated Creatinine Clearance: 42 mL/min (by C-G formula based on SCr of 1.08 mg/dL). Liver Function Tests: No results for input(s): AST, ALT, ALKPHOS, BILITOT, PROT, ALBUMIN in the last 168 hours. No results for input(s): LIPASE, AMYLASE in the last 168 hours. No results for input(s): AMMONIA in the last 168 hours. Coagulation Profile: No results for input(s): INR, PROTIME in the last 168 hours. Cardiac Enzymes: No results for input(s): CKTOTAL, CKMB, CKMBINDEX, TROPONINI in the last 168 hours. BNP (last 3 results) No results for input(s): PROBNP in the last 8760 hours. HbA1C: No results for input(s): HGBA1C in the last 72 hours. CBG: No results for input(s): GLUCAP in the last 168 hours. Lipid Profile: No results for input(s): CHOL, HDL, LDLCALC, TRIG, CHOLHDL,  LDLDIRECT in the last 72 hours. Thyroid Function Tests: No results for input(s): TSH, T4TOTAL, FREET4, T3FREE, THYROIDAB in the last 72 hours. Anemia Panel: No results for input(s): VITAMINB12, FOLATE, FERRITIN, TIBC, IRON, RETICCTPCT in the last 72 hours. Sepsis Labs: No results for input(s): PROCALCITON, LATICACIDVEN in the last 168 hours.  Recent Results (from the past 240 hour(s))  MRSA PCR Screening     Status: Abnormal   Collection Time: 07/11/19  7:43 PM   Specimen: Nasopharyngeal  Result Value Ref Range Status   MRSA by PCR POSITIVE (A) NEGATIVE Final    Comment:        The GeneXpert MRSA Assay (FDA approved for NASAL specimens only), is one component of a comprehensive MRSA colonization surveillance program. It is not intended to diagnose MRSA infection nor to guide or monitor treatment for MRSA infections. RESULT CALLED TO, READ BACK BY AND VERIFIED WITH: S.NESMITH AT 2220 ON 07/11/19 BY N.THOMPSON Performed at [facility], [place] [address] [place], [place] [address]          Radiology Studies: No  results found.      Scheduled Meds: . amiodarone  400 mg Oral BID  . apixaban  5 mg Oral BID  . capsaicin   Topical BID  . chlorhexidine  15 mL Mouth Rinse BID  . Chlorhexidine Gluconate Cloth  6 each Topical Q0600  . feeding supplement (ENSURE ENLIVE)  237 mL Oral Q24H  . furosemide  40 mg Oral Daily  . mouth rinse  15 mL Mouth Rinse q12n4p  . metoprolol succinate  25 mg Oral Daily  . mirtazapine  7.5 mg Oral QHS  . umeclidinium-vilanterol  1 puff Inhalation Daily   Continuous Infusions:   LOS: 11 days   Time spent= 35 mins    Ozelle Brubacher Arsenio Loader, MD [facility]  If 7PM-7AM, please contact night-coverage  07/21/2019, 8:45 AM

## 2019-07-21 NOTE — Evaluation (Signed)
Occupational Therapy Evaluation Patient Details Name: [name] [name] MRN: [id number] DOB: June 08, 1924 Today's Date: 07/21/2019    History of Present Illness [name] [name] is a 86 y.o. male with a known history of Afib with PPM, CKD3, COPD,  presents to the emergency department 07/11/19  for evaluation of weakness, SOB, LE edema, falls. He had been on PT caseload, but was d/c due to pending d/c to [facility].  Pt and family ended up changing their mind and want to try rehab, so therapy reordered.   Clinical Impression   [name] [name] is a 86 year old man with multiple co-morbidities who presents with generalized weakness, poor balance, significant posterior lean, poor activity tolerance and poor skin integrity with complaints of pain with touching in upper extremities resulting in significant impairments with functional mobility and participation with ADLs. Patient requiring assistance of two for transfers and unable to stand at this time and ADLs performed at bed level. Patient will benefit from skilled OT services to improve deficits and return to PLOF or improve functional abilities in order to reduce caregiver burden.    Follow Up Recommendations  SNF    Equipment Recommendations  3 in 1 bedside commode    Recommendations for Other Services       Precautions / Restrictions Precautions Precautions: Fall Precaution Comments: has had several. 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 Restrictions Weight Bearing Restrictions: No      Mobility Bed Mobility Overal bed mobility: Needs Assistance Bed Mobility: Supine to Sit;Sit to Supine Rolling: Mod assist Sidelying to sit: Mod assist;+2 for safety/equipment;+2 for physical assistance Supine to sit: Min assist;+2 for physical assistance Sit to supine: Max assist;+2 for physical assistance   General bed mobility comments: Pt able to  initiate transfer with rail and increased time.  He needed MIN A of 2 at end of transfer to get trunk upright. MAX of 2 to return supine.  Transfers Overall transfer level: Needs assistance Equipment used: Rolling walker (2 wheeled);Ambulation equipment used Transfers: Sit to/from Stand Sit to Stand: Total assist;+2 physical assistance         General transfer comment: First attempted sit to stand to RW and pt with retropulsive posture with posterior lean and feet sliding. Blocked feet and attempted again and pt very anxious that he could fall and leaning back.  Tried sit > stand with Denna Haggard and able to clear bed better than with RW, but ut unable to fully stand.    Balance Overall balance assessment: Needs assistance Sitting-balance support: Feet supported;Bilateral upper extremity supported Sitting balance-Leahy Scale: Poor Sitting balance - Comments: Initially pt able to sit EOB with S. Once initiation of sit > stand was started, pt had a posterior lean and needed increased A to stay uprigh. Postural control: Posterior lean                                 ADL either performed or assessed with clinical judgement   ADL Overall ADL's : Needs assistance/impaired Eating/Feeding: Set up;Bed level   Grooming: Set up;Bed level   Upper Body Bathing: Moderate assistance;Bed level   Lower Body Bathing: Bed level;Maximal assistance   Upper Body Dressing : Moderate assistance;Bed level   Lower Body Dressing: Total assistance;Bed level;+2 for physical assistance     Toilet Transfer Details (indicate cue type and reason): N/A; requires bed  pan and external urinary catheter Toileting- Clothing Manipulation and Hygiene: Total assistance;Bed level     Tub/Shower Transfer Details (indicate cue type and reason): n/a; unable to stand and transfer.         Vision   Vision Assessment?: No apparent visual deficits     Perception     Praxis      Pertinent  Vitals/Pain Pain Assessment: Faces Faces Pain Scale: Hurts little more Pain Location: arms, unable to tolerate handling Pain Descriptors / Indicators: Discomfort;Grimacing Pain Intervention(s): Limited activity within patient's tolerance     Hand Dominance Right   Extremity/Trunk Assessment Upper Extremity Assessment Upper Extremity Assessment: RUE deficits/detail;LUE deficits/detail RUE Deficits / Details: R shoulder 3+/5, 4-/5 elbow, 5/5 wrist, 4-/5 grip, LUE Deficits / Details: Left shoulder 4-/5, 4-/5 elbow, 5/5 wrist, 4-/5 grip   Lower Extremity Assessment Lower Extremity Assessment: Generalized weakness   Cervical / Trunk Assessment Cervical / Trunk Assessment: Kyphotic   Communication Communication Communication: HOH   Cognition Arousal/Alertness: Awake/alert Behavior During Therapy: WFL for tasks assessed/performed Overall Cognitive Status: No family/caregiver present to determine baseline cognitive functioning                                 General Comments: Pt able to follow directions. Some confusion with time and dates due to prolonged hospital stay   General Comments  Poor skin integrity, unable to hold onto patient's arms.    Exercises     Shoulder Instructions      Home Living Family/patient expects to be discharged to:: Private residence Living Arrangements: Spouse/significant other Available Help at Discharge: Family;Available 24 hours/day Type of Home: House Home Access: Ramped entrance     Home Layout: One level     Bathroom Shower/Tub: Occupational psychologist: Handicapped height     Home Equipment: Environmental consultant - 4 wheels;Cane - single point   Additional Comments: lift chair,      Prior Functioning/Environment Level of Independence: Needs assistance  Gait / Transfers Assistance Needed: pt reports walks with cane or rolling walker ADL's / Homemaking Assistance Needed: wife assists with sponge bath and dressing             OT Problem List: Decreased strength;Decreased activity tolerance;Impaired balance (sitting and/or standing);Decreased safety awareness;Decreased knowledge of use of DME or AE;Pain      OT Treatment/Interventions: Self-care/ADL training;Therapeutic exercise;Energy conservation;DME and/or AE instruction;Therapeutic activities;Patient/family education;Balance training    OT Goals(Current goals can be found in the care plan section) Acute Rehab OT Goals Patient Stated Goal: Wants to stand OT Goal Formulation: With patient Time For Goal Achievement: 08/04/19 Potential to Achieve Goals: Fair  OT Frequency: Min 2X/week   Barriers to D/C:            Co-evaluation PT/OT/SLP Co-Evaluation/Treatment: Yes Reason for Co-Treatment: Complexity of the patient's impairments (multi-system involvement);For patient/therapist safety;To address functional/ADL transfers PT goals addressed during session: Mobility/safety with mobility OT goals addressed during session: ADL's and self-care      AM-PAC OT "6 Clicks" Daily Activity     Outcome Measure Help from another person eating meals?: A Little Help from another person taking care of personal grooming?: A Little Help from another person toileting, which includes using toliet, bedpan, or urinal?: Total Help from another person bathing (including washing, rinsing, drying)?: A Lot Help from another person to put on and taking off regular upper body clothing?: Total Help from another person  to put on and taking off regular lower body clothing?: Total 6 Click Score: 11   End of Session Equipment Utilized During Treatment: Gait belt;Rolling walker Nurse Communication: Mobility status;Need for lift equipment  Activity Tolerance:   Patient left: in bed;with call bell/phone within reach;with bed alarm set  OT Visit Diagnosis: Other abnormalities of gait and mobility (R26.89);Muscle weakness (generalized) (M62.81);History of falling (Z91.81);Pain                 Time: 1020-1050 OT Time Calculation (min): 30 min Charges:  OT General Charges $OT Visit: 1 Visit OT Evaluation $OT Eval Moderate Complexity: 1 Mod  Annete Ayuso, OTR/L [place]  Office [phone]   Lenward Chancellor 07/21/2019, 12:54 PM

## 2019-07-21 NOTE — Progress Notes (Addendum)
Progress Note  Patient Name: [name] [name] Date of Encounter: 07/21/2019  Primary Cardiologist: Larae Grooms, MD   Subjective   No CP or SOB.  Remains in afib on tele with HR in the 80's.  Inpatient Medications    Scheduled Meds: . amiodarone  400 mg Oral BID  . apixaban  5 mg Oral BID  . capsaicin   Topical BID  . chlorhexidine  15 mL Mouth Rinse BID  . Chlorhexidine Gluconate Cloth  6 each Topical Q0600  . feeding supplement (ENSURE ENLIVE)  237 mL Oral Q24H  . furosemide  40 mg Oral Daily  . mouth rinse  15 mL Mouth Rinse q12n4p  . metoprolol succinate  25 mg Oral Daily  . mirtazapine  7.5 mg Oral QHS  . umeclidinium-vilanterol  1 puff Inhalation Daily   Continuous Infusions:  PRN Meds: acetaminophen **OR** acetaminophen, albuterol, antiseptic oral rinse, bisacodyl, diphenhydrAMINE, fluticasone, glycopyrrolate **OR** glycopyrrolate **OR** glycopyrrolate, haloperidol **OR** haloperidol **OR** haloperidol lactate, HYDROmorphone (DILAUDID) injection, LORazepam **OR** LORazepam **OR** LORazepam, ondansetron (ZOFRAN) IV, oxyCODONE **OR** oxyCODONE, polyvinyl alcohol, psyllium   Vital Signs    Vitals:   07/21/19 0434 07/21/19 0600 07/21/19 0813 07/21/19 0815  BP: 99/65     Pulse: 71     Resp: 14     Temp: 97.7 F (36.5 C)     TempSrc: Oral     SpO2: 94%  93% 93%  Weight:  69.5 kg    Height:        Intake/Output Summary (Last 24 hours) at 07/21/2019 0840 Last data filed at 07/21/2019 0434 Gross per 24 hour  Intake 720 ml  Output 900 ml  Net -180 ml   Last 3 Weights 07/21/2019 07/16/2019 07/15/2019  Weight (lbs) 153 lb 3.5 oz 143 lb 15.4 oz 154 lb 8.7 oz  Weight (kg) 69.5 kg 65.3 kg 70.1 kg      Telemetry    AFIB, rate ok, PVCs and pairs - Personally Reviewed  ECG    n/a - Personally Reviewed  Physical Exam   VS:  BP 99/65 (BP Location: Right Leg)   Pulse 71   Temp 97.7 F (36.5 C) (Oral)   Resp 14   Ht 5\' 10"  (1.778 m)   Wt 69.5 kg   SpO2 93%    BMI 21.98 kg/m  , BMI Body mass index is 21.98 kg/m.  GEN: Well nourished, well developed in no acute distress HEENT: Normal NECK: No JVD; No carotid bruits LYMPHATICS: No lymphadenopathy CARDIAC:irregularly irregular, no murmurs, rubs, gallops RESPIRATORY:  Clear to auscultation without rales, wheezing or rhonchi  ABDOMEN: Soft, non-tender, non-distended MUSCULOSKELETAL:  1+ LE edema; No deformity  SKIN: Warm and dry NEUROLOGIC:  Alert and oriented x 3 PSYCHIATRIC:  Normal affect    Labs    High Sensitivity Troponin:   Recent Labs  Lab 07/10/19 1620 07/10/19 1820  TROPONINIHS 68* 73*      Chemistry Recent Labs  Lab 07/19/19 1411 07/20/19 0405 07/21/19 0424  NA 140 140 136  K 3.5 3.7 3.5  CL 98 100 98  CO2 31 30 30   GLUCOSE 159* 91 100*  BUN 53* 46* 45*  CREATININE 1.27* 1.09 1.08  CALCIUM 8.2* 8.0* 7.7*  GFRNONAA 48* 58* 59*  GFRAA 56* >60 >60  ANIONGAP 11 10 8      Hematology Recent Labs  Lab 07/16/19 0234 07/19/19 1411 07/20/19 0405  WBC 9.3 8.5 7.3  RBC 3.51* 3.72* 3.64*  HGB 11.9* 12.6* 12.4*  HCT 39.0 40.2 40.3  MCV 111.1* 108.1* 110.7*  MCH 33.9 33.9 34.1*  MCHC 30.5 31.3 30.8  RDW 15.0 14.8 14.6  PLT 115* 142* 122*    BNP No results for input(s): BNP, PROBNP in the last 168 hours.    Radiology    No results found.  Cardiac Studies   Echo 07/11/2019:  1. Severely reduced LV function, 15-20%. LVOT VTI 8 cm. Left ventricular  ejection fraction, by estimation, is 15-20%. The left ventricle has  severely decreased function. The left ventricle demonstrates global  hypokinesis. Left ventricular diastolic  function could not be evaluated.  2. Right ventricular systolic function is moderately reduced. The right  ventricular size is moderately enlarged. There is moderately elevated  pulmonary artery systolic pressure. The estimated right ventricular  systolic pressure is 51.0 mmHg.  3. Left atrial size was moderately dilated.  4.  Right atrial size was moderately dilated.  5. The mitral valve is degenerative. Moderate to severe mitral valve  regurgitation. No evidence of mitral stenosis.  6. The aortic valve is tricuspid and severely calcified with restricted  movement in systole. The gradients are quite low given the severely  reduced LV function (LVOT VTI 8 cm, SV=34 cc, SVi=18 cc/m2). AVA by VTI is  1.44 cm2 and DI is 0.34, consistent  with moderate aortic stenosis. If there are concerns for severe aortic  stenosis, would recommend an aortic valve calcium score for clarification.  The aortic valve is tricuspid. Aortic valve regurgitation is not  visualized. Moderate to severe aortic valve  stenosis. Aortic valve area, by VTI measures 1.44 cm. Aortic valve mean  gradient measures 5.0 mmHg. Aortic valve Vmax measures 1.50 m/s.   Patient Profile     86 y.o. male with sick sinus syndrome status post pacemaker, persistent atrial fibrillation,CKD 3,COPD, prior DVT and hypertensionadmitted with acute on chronic systolic and diastolic heart failure and atrial fibrillation with rapid ventricular response.  Assessment & Plan    # Persistent atrial fibrillation with RVR:  - continuous since 05/22/2019 -Found to have new systolic heart failure, DCCV was planned.  Patient decided to go to hospice so DCCV was cancelled and Eliquis was held.  In discussion with his son he decided he did not want hospice but wanted to continue aggressive care, he was restarted on amio 400 mg twice daily, Eliquis 5 mg daily, and Toprol-XL 25 mg daily -remains in atrial fibrillation with CVR with HRs in the 80's -Plan TEE/DCCV Monday, cannot just do cardioversion because he was off Eliquis for several days  # Acute systolic and diastolic heart failure:  -No weight since 5/31, at that time he was 144 pounds --he put out 900cc yesterday and is net neg 5.6L --weight does not appear accurate on 5/31 as all other weights have been in the  150's.  Weight at admission was 152lbs and then 156lbs on 5/29 and now down to 153lbs.  --creatinine stable at 1.08 -On Lasix 40 mg daily -Renal function improved during the time that he was off Lasix, this will need to be followed  # Moderate AS/MR: -No symptoms from this at this time  # Demand ischemia:  -Peak troponin 73 -No ischemic symptoms -No additional work-up indicated  I have spent a total of 35 minutes with patient reviewing hospital notes and consults, 2D echo, telemetry, EKGs, labs and examining patient as well as establishing an assessment and plan that was discussed with the patient.  > 50% of time was spent in  direct patient care.    Otherwise, per IM For questions or updates, please contact [place] Please consult [email or web address] for contact info under        Signed, Fransico Him, MD  07/21/2019, 8:40 AM     Patient seen and examined.  Agree with above documentation.  On exam, patient is alert and oriented, normal rate, irregular rhythm, 2/6 systolic murmur, lungs CTAB, no LE edema, JVD to just above the clavicle while sitting upright.  Telemetry personally reviewed, shows atrial fibrillation with rates 70s to 90s, PVCs.  AF rates appear controlled on Toprol-XL 25 mg.  Given his acute systolic heart failure with EF 15-20% likely due to AF, recommend trying to restore sinus rhythm.  Will continue amiodarone load with 400 mg twice daily through the weekend and plan for TEE/DCCV on Monday.   Fransico Him, MD

## 2019-07-21 NOTE — Evaluation (Signed)
Physical Therapy RE- Evaluation Patient Details Name: [name] [name] MRN: [id number] DOB: 09/26/1924 Today's Date: 07/21/2019   History of Present Illness  Zahid Carneiro is a 86 y.o. male with a known history of Afib with PPM, CKD3, COPD,  presents to the emergency department 07/11/19  for evaluation of weakness, SOB, LE edema, falls. He had been on PT caseload, but was d/c due to pending d/c to [facility].  Pt and family ended up changing their mind and want to try rehab, so being seen again by PT.  Clinical Impression  Pt being re-evaluated by PT after pt and family decided against hospice.  Pt currently with functional limitations due to the deficits listed below (see PT Problem List). Pt will benefit from skilled PT to increase their independence and safety with mobility to allow discharge to the venue listed below.     Follow Up Recommendations SNF;Supervision/Assistance - 24 hour    Equipment Recommendations  None recommended by PT    Recommendations for Other Services       Precautions / Restrictions Precautions Precautions: Fall Precaution Comments: has had several. EF 10-15%, decreased skin integrity Restrictions Weight Bearing Restrictions: No      Mobility  Bed Mobility Overal bed mobility: Needs Assistance Bed Mobility: Supine to Sit;Sit to Supine     Supine to sit: Min assist;+2 for physical assistance Sit to supine: Max assist;+2 for physical assistance   General bed mobility comments: Pt able to initiate transfer with rail and increased time.  He needed MIN A of 2 at end of transfer to get trunk upright. MAX of 2 to return supine.  Transfers Overall transfer level: Needs assistance Equipment used: Rolling walker (2 wheeled);Ambulation equipment used Transfers: Sit to/from Stand Sit to Stand: Total assist;+2 physical assistance         General transfer comment: First attempted sit to stand to RW and pt with retropulsive posture with posterior lean and  feet sliding. Blocked feet and attempted again and pt very anxious that he could fall and leaning back.  Tried sit > stand with Denna Haggard and able to clear bed better than with RW, but ut unable to fully stand.  Ambulation/Gait                Stairs            Wheelchair Mobility    Modified Rankin (Stroke Patients Only)       Balance Overall balance assessment: Needs assistance Sitting-balance support: Feet supported;Bilateral upper extremity supported Sitting balance-Leahy Scale: Poor Sitting balance - Comments: Initially pt able to sit EOB with S. Once initiation of sit > stand was started, pt had a posterior lean and needed increased A to stay uprigh.                                     Pertinent Vitals/Pain Pain Assessment: Faces Faces Pain Scale: Hurts little more Pain Location: arms Pain Descriptors / Indicators: Discomfort;Grimacing Pain Intervention(s): Monitored during session;Repositioned;Limited activity within patient's tolerance    Home Living Family/patient expects to be discharged to:: Private residence Living Arrangements: Spouse/significant other Available Help at Discharge: Family;Available 24 hours/day Type of Home: House Home Access: Ramped entrance     Home Layout: One level Home Equipment: Walker - 4 wheels;Cane - single point Additional Comments: lift chair,    Prior Function Level of Independence: Needs assistance   Gait /  Transfers Assistance Needed: pt reports walks with cane or rolling walker  ADL's / Homemaking Assistance Needed: wife assists with sponge bath and dressing        Hand Dominance   Dominant Hand: Right    Extremity/Trunk Assessment   Upper Extremity Assessment Upper Extremity Assessment: Defer to OT evaluation    Lower Extremity Assessment Lower Extremity Assessment: Generalized weakness    Cervical / Trunk Assessment Cervical / Trunk Assessment: Kyphotic  Communication    Communication: HOH  Cognition Arousal/Alertness: Awake/alert Behavior During Therapy: WFL for tasks assessed/performed Overall Cognitive Status: No family/caregiver present to determine baseline cognitive functioning                                 General Comments: Pt able to follow directions. Some confusion with time and dates.      General Comments      Exercises     Assessment/Plan    PT Assessment Patient needs continued PT services  PT Problem List Decreased strength;Decreased activity tolerance;Decreased balance;Decreased mobility;Decreased coordination;Decreased knowledge of use of DME;Decreased safety awareness       PT Treatment Interventions DME instruction;Therapeutic activities;Gait training;Therapeutic exercise;Patient/family education;Functional mobility training    PT Goals (Current goals can be found in the Care Plan section)  Acute Rehab PT Goals Patient Stated Goal: Pt agreeable to PT PT Goal Formulation: With patient Time For Goal Achievement: 08/04/19 Potential to Achieve Goals: Fair    Frequency Min 2X/week   Barriers to discharge Decreased caregiver support      Co-evaluation PT/OT/SLP Co-Evaluation/Treatment: Yes Reason for Co-Treatment: Complexity of the patient's impairments (multi-system involvement);For patient/therapist safety;To address functional/ADL transfers PT goals addressed during session: Mobility/safety with mobility         AM-PAC PT "6 Clicks" Mobility  Outcome Measure Help needed turning from your back to your side while in a flat bed without using bedrails?: A Lot Help needed moving from lying on your back to sitting on the side of a flat bed without using bedrails?: A Lot Help needed moving to and from a bed to a chair (including a wheelchair)?: Total Help needed standing up from a chair using your arms (e.g., wheelchair or bedside chair)?: Total Help needed to walk in hospital room?: Total Help needed  climbing 3-5 steps with a railing? : Total 6 Click Score: 8    End of Session Equipment Utilized During Treatment: Gait belt Activity Tolerance: Patient tolerated treatment well Patient left: in bed;with call bell/phone within reach;with nursing/sitter in room Nurse Communication: Mobility status PT Visit Diagnosis: Unsteadiness on feet (R26.81);Other abnormalities of gait and mobility (R26.89);History of falling (Z91.81)    Time: 1020-1050 PT Time Calculation (min) (ACUTE ONLY): 30 min   Charges:   PT Evaluation $PT Re-evaluation: 1 Re-eval          Dontarius Sheley L. Tamala Julian, [place] Pager [phone] 07/21/2019   Galen Manila 07/21/2019, 12:07 PM

## 2019-07-22 LAB — BASIC METABOLIC PANEL WITH GFR
Anion gap: 10 (ref 5–15)
BUN: 47 mg/dL — ABNORMAL HIGH (ref 8–23)
CO2: 29 mmol/L (ref 22–32)
Calcium: 7.9 mg/dL — ABNORMAL LOW (ref 8.9–10.3)
Chloride: 97 mmol/L — ABNORMAL LOW (ref 98–111)
Creatinine, Ser: 1.07 mg/dL (ref 0.61–1.24)
GFR calc Af Amer: >60 mL/min
GFR calc non Af Amer: 60 mL/min — ABNORMAL LOW
Glucose, Bld: 101 mg/dL — ABNORMAL HIGH (ref 70–99)
Potassium: 3.7 mmol/L (ref 3.5–5.1)
Sodium: 136 mmol/L (ref 135–145)

## 2019-07-22 LAB — CBC
HCT: 34.8 % — ABNORMAL LOW (ref 39.0–52.0)
Hemoglobin: 10.9 g/dL — ABNORMAL LOW (ref 13.0–17.0)
MCH: 33.6 pg (ref 26.0–34.0)
MCHC: 31.3 g/dL (ref 30.0–36.0)
MCV: 107.4 fL — ABNORMAL HIGH (ref 80.0–100.0)
Platelets: 128 K/uL — ABNORMAL LOW (ref 150–400)
RBC: 3.24 MIL/uL — ABNORMAL LOW (ref 4.22–5.81)
RDW: 14.6 % (ref 11.5–15.5)
WBC: 6.4 K/uL (ref 4.0–10.5)
nRBC: 0 % (ref 0.0–0.2)

## 2019-07-22 LAB — MAGNESIUM: Magnesium: 2 mg/dL (ref 1.7–2.4)

## 2019-07-22 MED ORDER — POTASSIUM CHLORIDE CRYS ER 20 MEQ PO TBCR
20.0000 meq | EXTENDED_RELEASE_TABLET | Freq: Every day | ORAL | Status: DC
  Administered 2019-07-22: 20 meq via ORAL
  Filled 2019-07-22: qty 1

## 2019-07-22 MED ORDER — POTASSIUM CHLORIDE CRYS ER 20 MEQ PO TBCR
40.0000 meq | EXTENDED_RELEASE_TABLET | Freq: Once | ORAL | Status: AC
  Administered 2019-07-22: 40 meq via ORAL
  Filled 2019-07-22: qty 2

## 2019-07-22 NOTE — Progress Notes (Signed)
PROGRESS NOTE    [name] [name]  IBB:[id number] DOB: 21-Mar-1924 DOA: 07/10/2019 PCP: Lawerance Cruel, MD   Brief Narrative:  86 year old with history of A. fib with pacemaker, CKD stage III, COPD presented with weakness, shortness of breath and lower extremity edema.  ER found to have pulmonary vascular congestion, atrial fibrillation with RVR started on Lasix and Cardizem.  Pacemaker was interrogated which showed 78% burden of A. fib.  Echocardiogram showed an EF of 15-20% with global hypokinesis.  Amiodarone and beta-blocker was stopped, Eliquis reduced to 2.5 mg.  Patient was going to be discharged.  Hospice facility on 6/21 but son rescinded hospice decision and wants for cardiology evaluation.  Transfer team 6/7.   Assessment & Plan:   Active Problems:   Atrial fibrillation with RVR (HCC)   Acute exacerbation of CHF (congestive heart failure) (HCC)   Stage 3b chronic kidney disease   Acute combined systolic and diastolic heart failure (HCC)   Hypotension   Palliative care by specialist   Goals of care, counseling/discussion   General weakness   Acute congestive heart failure (HCC)   Persistent A. Fib, intermittent RVR. -Cardiology team following.  Plans for TEE/cardioversion on Monday. -Amiodarone 400 mg twice daily, Eliquis 5 mg, Toprol-XL 25 mg daily -Replete electrolytes as necessary  Acute congestive heart failure with reduced ejection fraction, class III. -This is complicated by atrial fibrillation with RVR.  Pacemaker showed 78% A. fib burden.  Echocardiogram showed EF 15-20% with global hypokinesis, elevated pulmonary arterial pressure, severe MR -Lasix 40 mg p.o. daily.  Monitor renal function -Cardiology following.  AKI on CKD stage 3B:  -Creatinine peaked at 1.5; has trended down to 1.07.  Continue to monitor.  Generalized weakness/debility/fall; Spinal Stenosis.  PT/OT-SNF  Bilateral lower extremity edema/wound Lower extremity Dopplers negative for  DVT.  Status post doxycycline  Iron deficiency anemia -Iron supplements, bowel regimen.  GERD -PPI  History of COPD -As needed bronchodilators, daily prednisone.  DVT prophylaxis: Eliquis Code Status: DNR Family Communication: None  Status is: Inpatient  Remains inpatient appropriate because:IV treatments appropriate due to intensity of illness or inability to take PO   Dispo: The patient is from: Home              Anticipated d/c is to: SNF              Anticipated d/c date is: 2 days              Patient currently is not medically stable to d/c.  Plans for TEE cardioversion tomorrow day after SNF placement  Subjective: Feels okay sitting up in the chair, no complaints at all.  Feeling little better overall.  Review of Systems Otherwise negative except as per HPI, including: General = no fevers, chills, dizziness,  fatigue HEENT/EYES = negative for loss of vision, double vision, blurred vision,  sore throa Cardiovascular= negative for chest pain, palpitation Respiratory/lungs= negative for shortness of breath, cough, wheezing; hemoptysis,  Gastrointestinal= negative for nausea, vomiting, abdominal pain Genitourinary= negative for Dysuria MSK = Negative for arthralgia, myalgias Neurology= Negative for headache, numbness, tingling  Psychiatry= Negative for suicidal and homocidal ideation Skin= Negative for Rash   Examination:  Constitutional: Not in acute distress Respiratory: Bibasilar crackles Cardiovascular: Irregularly irregular Abdomen: Nontender nondistended good bowel sounds Musculoskeletal: No edema noted Skin: No rashes seen Neurologic: CN 2-12 grossly intact.  And nonfocal Psychiatric: Normal judgment and insight. Alert and oriented x 3. Normal mood.      Objective:  Vitals:   07/21/19 1436 07/21/19 1440 07/21/19 2117 07/22/19 0633  BP: 112/72  97/68 106/71  Pulse: 82 81 72 69  Resp: 14  16 17   Temp: 97.8 F (36.6 C)  98.1 F (36.7 C) 97.6 F  (36.4 C)  TempSrc: Oral  Oral Oral  SpO2: (!) 87% 97% 98% 96%  Weight:      Height:        Intake/Output Summary (Last 24 hours) at 07/22/2019 0839 Last data filed at 07/22/2019 0647 Gross per 24 hour  Intake 480 ml  Output 800 ml  Net -320 ml   Filed Weights   07/15/19 0500 07/16/19 0500 07/21/19 0600  Weight: 70.1 kg 65.3 kg 69.5 kg     Data Reviewed:   CBC: Recent Labs  Lab 07/16/19 0234 07/19/19 1411 07/20/19 0405 07/22/19 0410  WBC 9.3 8.5 7.3 6.4  NEUTROABS 8.5* 7.6  --   --   HGB 11.9* 12.6* 12.4* 10.9*  HCT 39.0 40.2 40.3 34.8*  MCV 111.1* 108.1* 110.7* 107.4*  PLT 115* 142* 122* 786*   Basic Metabolic Panel: Recent Labs  Lab 07/16/19 0234 07/19/19 1411 07/20/19 0405 07/21/19 0424 07/22/19 0410  NA 140 140 140 136 136  K 3.7 3.5 3.7 3.5 3.7  CL 101 98 100 98 97*  CO2 28 31 30 30 29   GLUCOSE 108* 159* 91 100* 101*  BUN 54* 53* 46* 45* 47*  CREATININE 1.33* 1.27* 1.09 1.08 1.07  CALCIUM 8.2* 8.2* 8.0* 7.7* 7.9*  MG  --   --   --   --  2.0   GFR: Estimated Creatinine Clearance: 42.4 mL/min (by C-G formula based on SCr of 1.07 mg/dL). Liver Function Tests: No results for input(s): AST, ALT, ALKPHOS, BILITOT, PROT, ALBUMIN in the last 168 hours. No results for input(s): LIPASE, AMYLASE in the last 168 hours. No results for input(s): AMMONIA in the last 168 hours. Coagulation Profile: No results for input(s): INR, PROTIME in the last 168 hours. Cardiac Enzymes: No results for input(s): CKTOTAL, CKMB, CKMBINDEX, TROPONINI in the last 168 hours. BNP (last 3 results) No results for input(s): PROBNP in the last 8760 hours. HbA1C: No results for input(s): HGBA1C in the last 72 hours. CBG: No results for input(s): GLUCAP in the last 168 hours. Lipid Profile: No results for input(s): CHOL, HDL, LDLCALC, TRIG, CHOLHDL, LDLDIRECT in the last 72 hours. Thyroid Function Tests: No results for input(s): TSH, T4TOTAL, FREET4, T3FREE, THYROIDAB in the last 72  hours. Anemia Panel: No results for input(s): VITAMINB12, FOLATE, FERRITIN, TIBC, IRON, RETICCTPCT in the last 72 hours. Sepsis Labs: No results for input(s): PROCALCITON, LATICACIDVEN in the last 168 hours.  No results found for this or any previous visit (from the past 240 hour(s)).       Radiology Studies: No results found.      Scheduled Meds: . amiodarone  400 mg Oral BID  . apixaban  5 mg Oral BID  . capsaicin   Topical BID  . chlorhexidine  15 mL Mouth Rinse BID  . Chlorhexidine Gluconate Cloth  6 each Topical Q0600  . feeding supplement (ENSURE ENLIVE)  237 mL Oral Q24H  . furosemide  40 mg Oral Daily  . mouth rinse  15 mL Mouth Rinse q12n4p  . metoprolol succinate  25 mg Oral Daily  . mirtazapine  7.5 mg Oral QHS  . potassium chloride  20 mEq Oral Daily  . potassium chloride  40 mEq Oral Once  . predniSONE  10 mg Oral Q breakfast  . sodium chloride flush  10-40 mL Intracatheter Q12H  . umeclidinium-vilanterol  1 puff Inhalation Daily   Continuous Infusions:   LOS: 12 days   Time spent= 35 mins    Kashden Deboy Arsenio Loader, MD [facility]  If 7PM-7AM, please contact night-coverage  07/22/2019, 8:39 AM

## 2019-07-22 NOTE — Progress Notes (Signed)
Progress Note  Patient Name: [name] [name] Date of Encounter: 07/22/2019  Primary Cardiologist: Larae Grooms, MD   Subjective   Denies any chest pain or SOB.  Remains in atrial fib with CVR  Inpatient Medications    Scheduled Meds: . amiodarone  400 mg Oral BID  . apixaban  5 mg Oral BID  . capsaicin   Topical BID  . chlorhexidine  15 mL Mouth Rinse BID  . Chlorhexidine Gluconate Cloth  6 each Topical Q0600  . feeding supplement (ENSURE ENLIVE)  237 mL Oral Q24H  . furosemide  40 mg Oral Daily  . mouth rinse  15 mL Mouth Rinse q12n4p  . metoprolol succinate  25 mg Oral Daily  . mirtazapine  7.5 mg Oral QHS  . predniSONE  10 mg Oral Q breakfast  . sodium chloride flush  10-40 mL Intracatheter Q12H  . umeclidinium-vilanterol  1 puff Inhalation Daily   Continuous Infusions:  PRN Meds: acetaminophen, fluticasone, ipratropium-albuterol, ondansetron (ZOFRAN) IV, oxyCODONE **OR** oxyCODONE, polyethylene glycol, psyllium, senna-docusate, sodium chloride flush   Vital Signs    Vitals:   07/21/19 1436 07/21/19 1440 07/21/19 2117 07/22/19 0633  BP: 112/72  97/68 106/71  Pulse: 82 81 72 69  Resp: 14  16 17   Temp: 97.8 F (36.6 C)  98.1 F (36.7 C) 97.6 F (36.4 C)  TempSrc: Oral  Oral Oral  SpO2: (!) 87% 97% 98% 96%  Weight:      Height:        Intake/Output Summary (Last 24 hours) at 07/22/2019 0730 Last data filed at 07/22/2019 0647 Gross per 24 hour  Intake 480 ml  Output 800 ml  Net -320 ml   Last 3 Weights 07/21/2019 07/16/2019 07/15/2019  Weight (lbs) 153 lb 3.5 oz 143 lb 15.4 oz 154 lb 8.7 oz  Weight (kg) 69.5 kg 65.3 kg 70.1 kg      Telemetry    afib with CVR - Personally Reviewed  ECG    n/a - Personally Reviewed  Physical Exam   VS:  BP 106/71 (BP Location: Left Arm)   Pulse 69   Temp 97.6 F (36.4 C) (Oral)   Resp 17   Ht 5\' 10"  (1.778 m)   Wt 69.5 kg   SpO2 96%   BMI 21.98 kg/m  , BMI Body mass index is 21.98 kg/m.  GEN: Well  nourished, well developed in no acute distress HEENT: Normal NECK: No JVD; No carotid bruits LYMPHATICS: No lymphadenopathy CARDIAC:irregularly irregular, no murmurs, rubs, gallops RESPIRATORY:  Clear to auscultation without rales, wheezing or rhonchi  ABDOMEN: Soft, non-tender, non-distended MUSCULOSKELETAL:  Trace RLE edema; No deformity  SKIN: Warm and dry NEUROLOGIC:  Alert and oriented x 3 PSYCHIATRIC:  Normal affect    Labs    High Sensitivity Troponin:   Recent Labs  Lab 07/10/19 1620 07/10/19 1820  TROPONINIHS 68* 73*      Chemistry Recent Labs  Lab 07/20/19 0405 07/21/19 0424 07/22/19 0410  NA 140 136 136  K 3.7 3.5 3.7  CL 100 98 97*  CO2 30 30 29   GLUCOSE 91 100* 101*  BUN 46* 45* 47*  CREATININE 1.09 1.08 1.07  CALCIUM 8.0* 7.7* 7.9*  GFRNONAA 58* 59* 60*  GFRAA >60 >60 >60  ANIONGAP 10 8 10      Hematology Recent Labs  Lab 07/19/19 1411 07/20/19 0405 07/22/19 0410  WBC 8.5 7.3 6.4  RBC 3.72* 3.64* 3.24*  HGB 12.6* 12.4* 10.9*  HCT 40.2  40.3 34.8*  MCV 108.1* 110.7* 107.4*  MCH 33.9 34.1* 33.6  MCHC 31.3 30.8 31.3  RDW 14.8 14.6 14.6  PLT 142* 122* 128*    BNP No results for input(s): BNP, PROBNP in the last 168 hours.    Radiology    No results found.  Cardiac Studies   Echo 07/11/2019:  1. Severely reduced LV function, 15-20%. LVOT VTI 8 cm. Left ventricular  ejection fraction, by estimation, is 15-20%. The left ventricle has  severely decreased function. The left ventricle demonstrates global  hypokinesis. Left ventricular diastolic  function could not be evaluated.  2. Right ventricular systolic function is moderately reduced. The right  ventricular size is moderately enlarged. There is moderately elevated  pulmonary artery systolic pressure. The estimated right ventricular  systolic pressure is 01.0 mmHg.  3. Left atrial size was moderately dilated.  4. Right atrial size was moderately dilated.  5. The mitral valve  is degenerative. Moderate to severe mitral valve  regurgitation. No evidence of mitral stenosis.  6. The aortic valve is tricuspid and severely calcified with restricted  movement in systole. The gradients are quite low given the severely  reduced LV function (LVOT VTI 8 cm, SV=34 cc, SVi=18 cc/m2). AVA by VTI is  1.44 cm2 and DI is 0.34, consistent  with moderate aortic stenosis. If there are concerns for severe aortic  stenosis, would recommend an aortic valve calcium score for clarification.  The aortic valve is tricuspid. Aortic valve regurgitation is not  visualized. Moderate to severe aortic valve  stenosis. Aortic valve area, by VTI measures 1.44 cm. Aortic valve mean  gradient measures 5.0 mmHg. Aortic valve Vmax measures 1.50 m/s.   Patient Profile     86 y.o. male with sick sinus syndrome status post pacemaker, persistent atrial fibrillation,CKD 3,COPD, prior DVT and hypertensionadmitted with acute on chronic systolic and diastolic heart failure and atrial fibrillation with rapid ventricular response.  Assessment & Plan    # Persistent atrial fibrillation with RVR:  - continuous since 05/22/2019 -Found to have new systolic heart failure, DCCV was planned.  Patient decided to go to hospice so DCCV was cancelled and Eliquis was held.  In discussion with his son he decided he did not want hospice but wanted to continue aggressive care, he was restarted on amio 400 mg twice daily, Eliquis 5 mg daily, and Toprol-XL 25 mg daily -remains in atrial fibrillation with CVR with HRs in the 80's -he is scheduled for TEE/DCCV tomorrow -K+ 3.7 and would like it >4.   -Add Kdur 9meq daily -daily BMET  # Acute systolic and diastolic heart failure:  --he put out 800cc yesterday and is net neg 5.96L --weight does not appear accurate on 5/31 as all other weights have been in the 150's.  Weight at admission was 152lbs and then 156lbs on 5/29 and now down to 153lbs.  --creatinine stable at  1.07 --he does not appear volume overloaded on exam -increase Lasix 40 mg BID PO -Renal function improved during the time that he was off Lasix, this will need to be followed  # Moderate AS/MR: -No symptoms from this at this time  # Demand ischemia:  -Peak troponin 73 -No ischemic symptoms -No additional work-up indicated  I have spent a total of 35 minutes with patient reviewing hospital notes and consults, 2D echo, telemetry, EKGs, labs and examining patient as well as establishing an assessment and plan that was discussed with the patient.  > 50% of time was  spent in direct patient care.    Otherwise, per IM For questions or updates, please contact [place] Please consult [email or web address] for contact info under        Signed, Fransico Him, MD  07/22/2019, 7:30 AM     Patient seen and examined.  Agree with above documentation.  On exam, patient is alert and oriented, normal rate, irregular rhythm, 2/6 systolic murmur, lungs CTAB, no LE edema, JVD to just above the clavicle while sitting upright.  Telemetry personally reviewed, shows atrial fibrillation with rates 70s to 90s, PVCs.  AF rates appear controlled on Toprol-XL 25 mg.  Given his acute systolic heart failure with EF 15-20% likely due to AF, recommend trying to restore sinus rhythm.  Will continue amiodarone load with 400 mg twice daily through the weekend and plan for TEE/DCCV on Monday.   Fransico Him, MD

## 2019-07-22 NOTE — Progress Notes (Signed)
Occupational Therapy Treatment Patient Details Name: [name] [name] MRN: [id number] DOB: [birth year] Today's Date: 07/22/2019    History of present illness [name] [name] is a 86 y.o. male with a known history of Afib with PPM, CKD3, COPD,  presents to the emergency department 07/11/19  for evaluation of weakness, SOB, LE edema, falls. He had been on PT caseload, but was d/c due to pending d/c to [facility].  Pt and family ended up changing their mind and want to try rehab, so therapy reordered.   OT comments  Patient seated in recliner after being hoyered into recliner by nursing with feet down and improving sitting balance. Treatment focused on sit to stand today as needed for functional tasks. Attempted x 2 with use of stedy but unable to get patient physically high enough to extend knees and patient's lower extremities to weak to assist with powering up. Improved balance and stabilizing feet compared to yesterday. May need to advance lift equipment. Cont POC.   Follow Up Recommendations  SNF    Equipment Recommendations  3 in 1 bedside commode    Recommendations for Other Services      Precautions / Restrictions Precautions Precautions: Fall Precaution Comments: has had several. 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 Restrictions Weight Bearing Restrictions: No       Mobility Bed Mobility                  Transfers     Transfers: Sit to/from Stand Sit to Stand: Total assist;+2 physical assistance         General transfer comment: Nursing hoyered patient to chair and had patient seated upright with feet down. Patient exhibited improved sitting balance and without posterior lean. Patient able to lean forward and grab stedy bar. Attempted to stand x 2 with use of stedy with total assist x 2. Patient unable to assist with power up in lower extremities (knees blocked with pillow  secondary to fragile skin, slippers donned and green non slip material under shoes). Unable to physically lift patient high enough to get him into extension. Crepitus heard in left shoulder with patient's effort.    Balance                                           ADL either performed or assessed with clinical judgement   ADL                                               Vision       Perception     Praxis      Cognition                                                Exercises     Shoulder Instructions       General Comments      Pertinent Vitals/ Pain          Home Living  Prior Functioning/Environment              Frequency  Min 2X/week        Progress Toward Goals  OT Goals(current goals can now be found in the care plan section)        Plan Discharge plan remains appropriate    Co-evaluation          OT goals addressed during session: (functional mobility in preparation for ADLs.)      AM-PAC OT "6 Clicks" Daily Activity     Outcome Measure                    End of Session Equipment Utilized During Treatment: Gait belt  OT Visit Diagnosis: Other abnormalities of gait and mobility (R26.89);Muscle weakness (generalized) (M62.81);History of falling (Z91.81);Pain   Activity Tolerance Patient tolerated treatment well   Patient Left in chair;with call bell/phone within reach(hoyer pad)   Nurse Communication Mobility status;Need for lift equipment        Time: 1107-1117 OT Time Calculation (min): 10 min  Charges: OT General Charges $OT Visit: 1 Visit OT Treatments $Therapeutic Activity: 8-22 mins  Derl Barrow, OTR/L [place]  Office [phone]    Lenward Chancellor 07/22/2019, 12:58 PM

## 2019-07-22 NOTE — Progress Notes (Signed)
Patient assisted to chair via hoyer lift this AM- sat up quite a while this shift. Pt back in bed at this time. Dressings changed as needed (patient has multiple skin tears to both arms and legs) as well as a stage 2/ deep tissue injury to sacrum. Allevyn in place and patient turned on his side with a pillow. Both heels elevated also.

## 2019-07-23 DIAGNOSIS — I951 Orthostatic hypotension: Secondary | ICD-10-CM

## 2019-07-23 LAB — BASIC METABOLIC PANEL
Anion gap: 8 (ref 5–15)
BUN: 50 mg/dL — ABNORMAL HIGH (ref 8–23)
CO2: 32 mmol/L (ref 22–32)
Calcium: 8.3 mg/dL — ABNORMAL LOW (ref 8.9–10.3)
Chloride: 96 mmol/L — ABNORMAL LOW (ref 98–111)
Creatinine, Ser: 1.42 mg/dL — ABNORMAL HIGH (ref 0.61–1.24)
GFR calc Af Amer: 49 mL/min — ABNORMAL LOW (ref >60)
GFR calc non Af Amer: 42 mL/min — ABNORMAL LOW (ref >60)
Glucose, Bld: 105 mg/dL — ABNORMAL HIGH (ref 70–99)
Potassium: 4.5 mmol/L (ref 3.5–5.1)
Sodium: 136 mmol/L (ref 135–145)

## 2019-07-23 LAB — CBC
HCT: 36.4 % — ABNORMAL LOW (ref 39.0–52.0)
Hemoglobin: 11.3 g/dL — ABNORMAL LOW (ref 13.0–17.0)
MCH: 33.4 pg (ref 26.0–34.0)
MCHC: 31 g/dL (ref 30.0–36.0)
MCV: 107.7 fL — ABNORMAL HIGH (ref 80.0–100.0)
Platelets: 149 10*3/uL — ABNORMAL LOW (ref 150–400)
RBC: 3.38 MIL/uL — ABNORMAL LOW (ref 4.22–5.81)
RDW: 14.6 % (ref 11.5–15.5)
WBC: 7.8 10*3/uL (ref 4.0–10.5)
nRBC: 0 % (ref 0.0–0.2)

## 2019-07-23 LAB — MAGNESIUM: Magnesium: 2.1 mg/dL (ref 1.7–2.4)

## 2019-07-23 MED ORDER — SODIUM CHLORIDE 0.9 % IV SOLN: INTRAVENOUS | Status: DC

## 2019-07-23 NOTE — H&P (View-Only) (Signed)
PROGRESS NOTE    [name] [name]  HBZ:[id number] DOB: 08-Mar-1924 DOA: 07/10/2019 PCP: Lawerance Cruel, MD   Brief Narrative:  86 year old with history of A. fib with pacemaker, CKD stage III, COPD presented with weakness, shortness of breath and lower extremity edema.  ER found to have pulmonary vascular congestion, atrial fibrillation with RVR started on Lasix and Cardizem.  Pacemaker was interrogated which showed 78% burden of A. fib.  Echocardiogram showed an EF of 15-20% with global hypokinesis.  Amiodarone and beta-blocker was stopped, Eliquis reduced to 2.5 mg.  Patient was going to be discharged.  Hospice facility on 6/21 but son rescinded hospice decision and wants for cardiology evaluation.  Transfer team 6/7.   Assessment & Plan:   Active Problems:   Atrial fibrillation with RVR (HCC)   Acute exacerbation of CHF (congestive heart failure) (HCC)   Stage 3b chronic kidney disease   Acute combined systolic and diastolic heart failure (HCC)   Hypotension   Palliative care by specialist   Goals of care, counseling/discussion   General weakness   Acute congestive heart failure (HCC)   Persistent A. Fib, intermittent RVR. -Cardiology team following.  Plans for TEE cardioversion tomorrow per cardiology -Amiodarone 400 mg twice daily, Eliquis 5 mg, Toprol-XL 25 mg daily -Replete electrolytes as necessary  Acute congestive heart failure with reduced ejection fraction, class III. -This is complicated by atrial fibrillation with RVR.  Pacemaker showed 78% A. fib burden.  Echocardiogram showed EF 15-20% with global hypokinesis, elevated pulmonary arterial pressure, severe MR -Hold off on Lasix 40 mg p.o. daily -Cardiology following.  AKI on CKD stage 3B:  -Creatinine peaked at 1.5; creatinine bumped up again to 1.42, gentle hydration hold Lasix.  .  Generalized weakness/debility/fall; Spinal Stenosis.  PT/OT-SNF  Bilateral lower extremity edema/wound Lower extremity  Dopplers negative for DVT.  Status post doxycycline  Iron deficiency anemia -Iron supplements, bowel regimen.  GERD -PPI  History of COPD -As needed bronchodilators, daily prednisone.  DVT prophylaxis: Eliquis Code Status: DNR Family Communication: Son Dr Tressia Miners updated.   Status is: Inpatient  Remains inpatient appropriate because:IV treatments appropriate due to intensity of illness or inability to take PO   Dispo: The patient is from: Home              Anticipated d/c is to: SNF              Anticipated d/c date is: 2 days              Patient currently is not medically stable to d/c.  Plans for TEE cardioversion tomorrow day after SNF placement  Subjective: Doesn't have complaints. Feels ok. Urine is dark.   Review of Systems Otherwise negative except as per HPI, including: General = no fevers, chills, dizziness,  fatigue HEENT/EYES = negative for loss of vision, double vision, blurred vision,  sore throa Cardiovascular= negative for chest pain, palpitation Respiratory/lungs= negative for shortness of breath, cough, wheezing; hemoptysis,  Gastrointestinal= negative for nausea, vomiting, abdominal pain Genitourinary= negative for Dysuria MSK = Negative for arthralgia, myalgias Neurology= Negative for headache, numbness, tingling  Psychiatry= Negative for suicidal and homocidal ideation Skin= Negative for Rash  Examination:  Constitutional: Not in acute distress; Elderly frail  Respiratory: bibasilar crackles.  Cardiovascular: Normal sinus rhythm, no rubs Abdomen: Nontender nondistended good bowel sounds Musculoskeletal: No edema noted Skin: No rashes seen Neurologic: CN 2-12 grossly intact.  And nonfocal Psychiatric: Normal judgment and insight. Alert and oriented x 3. Normal  mood.    Condom catheter in place.   Objective: Vitals:   07/22/19 2038 07/23/19 0500 07/23/19 0500 07/23/19 0821  BP: 126/66  114/61   Pulse: 70  72   Resp: 18  16   Temp:  98.5 F (36.9 C)  97.7 F (36.5 C)   TempSrc: Oral  Oral   SpO2: 97%  97% 97%  Weight:  71.3 kg    Height:        Intake/Output Summary (Last 24 hours) at 07/23/2019 1120 Last data filed at 07/22/2019 1841 Gross per 24 hour  Intake --  Output 800 ml  Net -800 ml   Filed Weights   07/16/19 0500 07/21/19 0600 07/23/19 0500  Weight: 65.3 kg 69.5 kg 71.3 kg     Data Reviewed:   CBC: Recent Labs  Lab 07/19/19 1411 07/20/19 0405 07/22/19 0410 07/23/19 0322  WBC 8.5 7.3 6.4 7.8  NEUTROABS 7.6  --   --   --   HGB 12.6* 12.4* 10.9* 11.3*  HCT 40.2 40.3 34.8* 36.4*  MCV 108.1* 110.7* 107.4* 107.7*  PLT 142* 122* 128* 283*   Basic Metabolic Panel: Recent Labs  Lab 07/19/19 1411 07/20/19 0405 07/21/19 0424 07/22/19 0410 07/23/19 0322  NA 140 140 136 136 136  K 3.5 3.7 3.5 3.7 4.5  CL 98 100 98 97* 96*  CO2 31 30 30 29  32  GLUCOSE 159* 91 100* 101* 105*  BUN 53* 46* 45* 47* 50*  CREATININE 1.27* 1.09 1.08 1.07 1.42*  CALCIUM 8.2* 8.0* 7.7* 7.9* 8.3*  MG  --   --   --  2.0 2.1   GFR: Estimated Creatinine Clearance: 32.8 mL/min (A) (by C-G formula based on SCr of 1.42 mg/dL (H)). Liver Function Tests: No results for input(s): AST, ALT, ALKPHOS, BILITOT, PROT, ALBUMIN in the last 168 hours. No results for input(s): LIPASE, AMYLASE in the last 168 hours. No results for input(s): AMMONIA in the last 168 hours. Coagulation Profile: No results for input(s): INR, PROTIME in the last 168 hours. Cardiac Enzymes: No results for input(s): CKTOTAL, CKMB, CKMBINDEX, TROPONINI in the last 168 hours. BNP (last 3 results) No results for input(s): PROBNP in the last 8760 hours. HbA1C: No results for input(s): HGBA1C in the last 72 hours. CBG: No results for input(s): GLUCAP in the last 168 hours. Lipid Profile: No results for input(s): CHOL, HDL, LDLCALC, TRIG, CHOLHDL, LDLDIRECT in the last 72 hours. Thyroid Function Tests: No results for input(s): TSH, T4TOTAL, FREET4,  T3FREE, THYROIDAB in the last 72 hours. Anemia Panel: No results for input(s): VITAMINB12, FOLATE, FERRITIN, TIBC, IRON, RETICCTPCT in the last 72 hours. Sepsis Labs: No results for input(s): PROCALCITON, LATICACIDVEN in the last 168 hours.  No results found for this or any previous visit (from the past 240 hour(s)).       Radiology Studies: No results found.      Scheduled Meds: . amiodarone  400 mg Oral BID  . apixaban  5 mg Oral BID  . capsaicin   Topical BID  . chlorhexidine  15 mL Mouth Rinse BID  . Chlorhexidine Gluconate Cloth  6 each Topical Q0600  . feeding supplement (ENSURE ENLIVE)  237 mL Oral Q24H  . mouth rinse  15 mL Mouth Rinse q12n4p  . metoprolol succinate  25 mg Oral Daily  . mirtazapine  7.5 mg Oral QHS  . predniSONE  10 mg Oral Q breakfast  . sodium chloride flush  10-40 mL Intracatheter Q12H  .  umeclidinium-vilanterol  1 puff Inhalation Daily   Continuous Infusions: . sodium chloride       LOS: 13 days   Time spent= 35 mins    Aloha Bartok Arsenio Loader, MD [facility]  If 7PM-7AM, please contact night-coverage  07/23/2019, 11:20 AM

## 2019-07-23 NOTE — Progress Notes (Signed)
    [facility] has been requested to perform a transesophageal echocardiogram on [name] [name] for atrial fibrillation.  After careful review of history and examination, the risks and benefits of transesophageal echocardiogram have been explained including risks of esophageal damage, perforation (1:10,000 risk), bleeding, pharyngeal hematoma as well as other potential complications associated with conscious sedation including aspiration, arrhythmia, respiratory failure and death. Alternatives to treatment were discussed, questions were answered. Patient is willing to proceed.   He is scheduled for TEE-guided cardioversion tomorrow with Dr. Meda Coffee. NPO at [place].   Tami Lin Sheral Pfahler, [place]  07/23/2019 10:20 AM

## 2019-07-23 NOTE — Progress Notes (Signed)
Progress Note  Patient Name: RAYDELL MANERS Date of Encounter: 07/23/2019  [facility] Cardiologist: Larae Grooms, MD   Subjective   Pt resting comfortably, no complaints. Consents to [facility] tomorrow.  Inpatient Medications    Scheduled Meds: . amiodarone  400 mg Oral BID  . apixaban  5 mg Oral BID  . capsaicin   Topical BID  . chlorhexidine  15 mL Mouth Rinse BID  . Chlorhexidine Gluconate Cloth  6 each Topical Q0600  . feeding supplement (ENSURE ENLIVE)  237 mL Oral Q24H  . mouth rinse  15 mL Mouth Rinse q12n4p  . metoprolol succinate  25 mg Oral Daily  . mirtazapine  7.5 mg Oral QHS  . potassium chloride  20 mEq Oral Daily  . predniSONE  10 mg Oral Q breakfast  . sodium chloride flush  10-40 mL Intracatheter Q12H  . umeclidinium-vilanterol  1 puff Inhalation Daily   Continuous Infusions: . sodium chloride     PRN Meds: acetaminophen, fluticasone, ipratropium-albuterol, ondansetron (ZOFRAN) IV, oxyCODONE **OR** oxyCODONE, polyethylene glycol, psyllium, senna-docusate, sodium chloride flush   Vital Signs    Vitals:   07/22/19 2038 07/23/19 0500 07/23/19 0500 07/23/19 0821  BP: 126/66  114/61   Pulse: 70  72   Resp: 18  16   Temp: 98.5 F (36.9 C)  97.7 F (36.5 C)   TempSrc: Oral  Oral   SpO2: 97%  97% 97%  Weight:  71.3 kg    Height:        Intake/Output Summary (Last 24 hours) at 07/23/2019 0939 Last data filed at 07/22/2019 1841 Gross per 24 hour  Intake --  Output 800 ml  Net -800 ml   Last 3 Weights 07/23/2019 07/21/2019 07/16/2019  Weight (lbs) 157 lb 3 oz 153 lb 3.5 oz 143 lb 15.4 oz  Weight (kg) 71.3 kg 69.5 kg 65.3 kg      Telemetry    Atrial fibrillation with ventricular rates in the 60-70s, bouts of paced rhythm - Personally Reviewed  ECG    No new tracings - Personally Reviewed  Physical Exam   GEN: No acute distress.   Neck: mild JVD Cardiac: . Irregular rhythm, regular rate, possible, holosystolic murmur heard right  sternal boarder and apex Respiratory: Clear to auscultation bilaterally. GI: Soft, nontender, non-distended  MS: 1+ B LE edema; No deformity. Neuro:  Nonfocal  Psych: Normal affect   Labs    High Sensitivity Troponin:   Recent Labs  Lab 07/10/19 1620 07/10/19 1820  TROPONINIHS 68* 73*      Chemistry Recent Labs  Lab 07/21/19 0424 07/22/19 0410 07/23/19 0322  NA 136 136 136  K 3.5 3.7 4.5  CL 98 97* 96*  CO2 30 29 32  GLUCOSE 100* 101* 105*  BUN 45* 47* 50*  CREATININE 1.08 1.07 1.42*  CALCIUM 7.7* 7.9* 8.3*  GFRNONAA 59* 60* 42*  GFRAA >60 >60 49*  ANIONGAP 8 10 8      Hematology Recent Labs  Lab 07/20/19 0405 07/22/19 0410 07/23/19 0322  WBC 7.3 6.4 7.8  RBC 3.64* 3.24* 3.38*  HGB 12.4* 10.9* 11.3*  HCT 40.3 34.8* 36.4*  MCV 110.7* 107.4* 107.7*  MCH 34.1* 33.6 33.4  MCHC 30.8 31.3 31.0  RDW 14.6 14.6 14.6  PLT 122* 128* 149*    BNPNo results for input(s): BNP, PROBNP in the last 168 hours.   DDimer No results for input(s): DDIMER in the last 168 hours.   Radiology    No  results found.  Cardiac Studies   Echo 07/11/19: 1. Severely reduced LV function, 15-20%. LVOT VTI 8 cm. Left ventricular  ejection fraction, by estimation, is 15-20%. The left ventricle has  severely decreased function. The left ventricle demonstrates global  hypokinesis. Left ventricular diastolic  function could not be evaluated.  2. Right ventricular systolic function is moderately reduced. The right  ventricular size is moderately enlarged. There is moderately elevated  pulmonary artery systolic pressure. The estimated right ventricular  systolic pressure is 15.4 mmHg.  3. Left atrial size was moderately dilated.  4. Right atrial size was moderately dilated.  5. The mitral valve is degenerative. Moderate to severe mitral valve  regurgitation. No evidence of mitral stenosis.  6. The aortic valve is tricuspid and severely calcified with restricted  movement in  systole. The gradients are quite low given the severely  reduced LV function (LVOT VTI 8 cm, SV=34 cc, SVi=18 cc/m2). AVA by VTI is  1.44 cm2 and DI is 0.34, consistent  with moderate aortic stenosis. If there are concerns for severe aortic  stenosis, would recommend an aortic valve calcium score for clarification.  The aortic valve is tricuspid. Aortic valve regurgitation is not  visualized. Moderate to severe aortic valve  stenosis. Aortic valve area, by VTI measures 1.44 cm. Aortic valve mean  gradient measures 5.0 mmHg. Aortic valve Vmax measures 1.50 m/s.   Patient Profile     86 y.o. male with sick sinus syndrome status post pacemaker, persistent atrial fibrillation,CKD 3,COPD, prior DVT and hypertensionadmitted with acute on chronic systolic and diastolic heart failure and atrial fibrillation with rapid ventricular response.  Assessment & Plan    Persistent atrial fibrillation with RVR, now CVR - continuous since 05/22/19 - with new systolic heart failure, DCCV was planned, but then canceled for plans to go to Hospice - eliquis held for hospice - since that time, Hospice was rescinded and pt son would like to have treatments - TEE-guided cardioversion now planned for 07/24/19 - continue amiodarone load - 400 mg BID on day 8 - continue eliquis 5 mg BID - continue BB    Acute systolic and diastolic heart failure Reduced RV function - new diagnosis - echo  - EF now 15-20%, question tachycardia mediated - diuresing on 40 mg PO lasix BID - overall net negative nearly 7 L, though I&Os may be incomplete - weight is 157 this morning, up from prior weight - question accuracy - weight has been labile  - once in sinus rhythm, may consider evaluation by AHF team   AoCKD - sCr 1.42 (1.07) - up from yesterday, but still within range of baseline - recommend repeat creatinine this afternoon - if continues to rise, may consider low cardiac output as an etiology   Moderate AS/MR -  continue to follow for now   Elevated troponin due to demand ischemia - hs troponin 68 --> 73 - felt to be due to demand ischemia in the setting of RVR and new onset HFrEF - no ischemic evaluation planned at this time      For questions or updates, please contact [facility] Please consult [email or web address] for contact info under        Signed, Ledora Bottcher, PA  07/23/2019, 9:39 AM

## 2019-07-23 NOTE — Progress Notes (Signed)
Patient scheduled for TEE/Cardioversion tomorrow, carelink set up to bring patient from [place] [facility] around 1030. Patient nurse notified of the plan, and that he should get his blood thinner before transport.

## 2019-07-23 NOTE — Care Management Important Message (Signed)
Important Message  Patient Details  Name: [name] [name] MRN: [id number] Date of Birth: 20-Jun-1924   Medicare Important Message Given:  Yes  Document was given to [place]  to give to the patient.   Crista Luria 07/23/2019, 11:18 AM

## 2019-07-23 NOTE — Progress Notes (Signed)
PROGRESS NOTE    [name] [name]  TYO:[id number] DOB: December 29, 1924 DOA: 07/10/2019 PCP: Lawerance Cruel, MD   Brief Narrative:  86 year old with history of A. fib with pacemaker, CKD stage III, COPD presented with weakness, shortness of breath and lower extremity edema.  ER found to have pulmonary vascular congestion, atrial fibrillation with RVR started on Lasix and Cardizem.  Pacemaker was interrogated which showed 78% burden of A. fib.  Echocardiogram showed an EF of 15-20% with global hypokinesis.  Amiodarone and beta-blocker was stopped, Eliquis reduced to 2.5 mg.  Patient was going to be discharged.  Hospice facility on 6/21 but son rescinded hospice decision and wants for cardiology evaluation.  Transfer team 6/7.   Assessment & Plan:   Active Problems:   Atrial fibrillation with RVR (HCC)   Acute exacerbation of CHF (congestive heart failure) (HCC)   Stage 3b chronic kidney disease   Acute combined systolic and diastolic heart failure (HCC)   Hypotension   Palliative care by specialist   Goals of care, counseling/discussion   General weakness   Acute congestive heart failure (HCC)   Persistent A. Fib, intermittent RVR. -Cardiology team following.  Plans for TEE cardioversion tomorrow per cardiology -Amiodarone 400 mg twice daily, Eliquis 5 mg, Toprol-XL 25 mg daily -Replete electrolytes as necessary  Acute congestive heart failure with reduced ejection fraction, class III. -This is complicated by atrial fibrillation with RVR.  Pacemaker showed 78% A. fib burden.  Echocardiogram showed EF 15-20% with global hypokinesis, elevated pulmonary arterial pressure, severe MR -Hold off on Lasix 40 mg p.o. daily -Cardiology following.  AKI on CKD stage 3B:  -Creatinine peaked at 1.5; creatinine bumped up again to 1.42, gentle hydration hold Lasix.  .  Generalized weakness/debility/fall; Spinal Stenosis.  PT/OT-SNF  Bilateral lower extremity edema/wound Lower extremity  Dopplers negative for DVT.  Status post doxycycline  Iron deficiency anemia -Iron supplements, bowel regimen.  GERD -PPI  History of COPD -As needed bronchodilators, daily prednisone.  DVT prophylaxis: Eliquis Code Status: DNR Family Communication: Son Dr Tressia Miners updated.   Status is: Inpatient  Remains inpatient appropriate because:IV treatments appropriate due to intensity of illness or inability to take PO   Dispo: The patient is from: Home              Anticipated d/c is to: SNF              Anticipated d/c date is: 2 days              Patient currently is not medically stable to d/c.  Plans for TEE cardioversion tomorrow day after SNF placement  Subjective: Doesn't have complaints. Feels ok. Urine is dark.   Review of Systems Otherwise negative except as per HPI, including: General = no fevers, chills, dizziness,  fatigue HEENT/EYES = negative for loss of vision, double vision, blurred vision,  sore throa Cardiovascular= negative for chest pain, palpitation Respiratory/lungs= negative for shortness of breath, cough, wheezing; hemoptysis,  Gastrointestinal= negative for nausea, vomiting, abdominal pain Genitourinary= negative for Dysuria MSK = Negative for arthralgia, myalgias Neurology= Negative for headache, numbness, tingling  Psychiatry= Negative for suicidal and homocidal ideation Skin= Negative for Rash  Examination:  Constitutional: Not in acute distress; Elderly frail  Respiratory: bibasilar crackles.  Cardiovascular: Normal sinus rhythm, no rubs Abdomen: Nontender nondistended good bowel sounds Musculoskeletal: No edema noted Skin: No rashes seen Neurologic: CN 2-12 grossly intact.  And nonfocal Psychiatric: Normal judgment and insight. Alert and oriented x 3. Normal  mood.    Condom catheter in place.   Objective: Vitals:   07/22/19 2038 07/23/19 0500 07/23/19 0500 07/23/19 0821  BP: 126/66  114/61   Pulse: 70  72   Resp: 18  16   Temp:  98.5 F (36.9 C)  97.7 F (36.5 C)   TempSrc: Oral  Oral   SpO2: 97%  97% 97%  Weight:  71.3 kg    Height:        Intake/Output Summary (Last 24 hours) at 07/23/2019 1120 Last data filed at 07/22/2019 1841 Gross per 24 hour  Intake --  Output 800 ml  Net -800 ml   Filed Weights   07/16/19 0500 07/21/19 0600 07/23/19 0500  Weight: 65.3 kg 69.5 kg 71.3 kg     Data Reviewed:   CBC: Recent Labs  Lab 07/19/19 1411 07/20/19 0405 07/22/19 0410 07/23/19 0322  WBC 8.5 7.3 6.4 7.8  NEUTROABS 7.6  --   --   --   HGB 12.6* 12.4* 10.9* 11.3*  HCT 40.2 40.3 34.8* 36.4*  MCV 108.1* 110.7* 107.4* 107.7*  PLT 142* 122* 128* 097*   Basic Metabolic Panel: Recent Labs  Lab 07/19/19 1411 07/20/19 0405 07/21/19 0424 07/22/19 0410 07/23/19 0322  NA 140 140 136 136 136  K 3.5 3.7 3.5 3.7 4.5  CL 98 100 98 97* 96*  CO2 31 30 30 29  32  GLUCOSE 159* 91 100* 101* 105*  BUN 53* 46* 45* 47* 50*  CREATININE 1.27* 1.09 1.08 1.07 1.42*  CALCIUM 8.2* 8.0* 7.7* 7.9* 8.3*  MG  --   --   --  2.0 2.1   GFR: Estimated Creatinine Clearance: 32.8 mL/min (A) (by C-G formula based on SCr of 1.42 mg/dL (H)). Liver Function Tests: No results for input(s): AST, ALT, ALKPHOS, BILITOT, PROT, ALBUMIN in the last 168 hours. No results for input(s): LIPASE, AMYLASE in the last 168 hours. No results for input(s): AMMONIA in the last 168 hours. Coagulation Profile: No results for input(s): INR, PROTIME in the last 168 hours. Cardiac Enzymes: No results for input(s): CKTOTAL, CKMB, CKMBINDEX, TROPONINI in the last 168 hours. BNP (last 3 results) No results for input(s): PROBNP in the last 8760 hours. HbA1C: No results for input(s): HGBA1C in the last 72 hours. CBG: No results for input(s): GLUCAP in the last 168 hours. Lipid Profile: No results for input(s): CHOL, HDL, LDLCALC, TRIG, CHOLHDL, LDLDIRECT in the last 72 hours. Thyroid Function Tests: No results for input(s): TSH, T4TOTAL, FREET4,  T3FREE, THYROIDAB in the last 72 hours. Anemia Panel: No results for input(s): VITAMINB12, FOLATE, FERRITIN, TIBC, IRON, RETICCTPCT in the last 72 hours. Sepsis Labs: No results for input(s): PROCALCITON, LATICACIDVEN in the last 168 hours.  No results found for this or any previous visit (from the past 240 hour(s)).       Radiology Studies: No results found.      Scheduled Meds: . amiodarone  400 mg Oral BID  . apixaban  5 mg Oral BID  . capsaicin   Topical BID  . chlorhexidine  15 mL Mouth Rinse BID  . Chlorhexidine Gluconate Cloth  6 each Topical Q0600  . feeding supplement (ENSURE ENLIVE)  237 mL Oral Q24H  . mouth rinse  15 mL Mouth Rinse q12n4p  . metoprolol succinate  25 mg Oral Daily  . mirtazapine  7.5 mg Oral QHS  . predniSONE  10 mg Oral Q breakfast  . sodium chloride flush  10-40 mL Intracatheter Q12H  .  umeclidinium-vilanterol  1 puff Inhalation Daily   Continuous Infusions: . sodium chloride       LOS: 13 days   Time spent= 35 mins    Jood Retana Arsenio Loader, MD [facility]  If 7PM-7AM, please contact night-coverage  07/23/2019, 11:20 AM

## 2019-07-24 ENCOUNTER — Encounter (HOSPITAL_COMMUNITY): Payer: Self-pay | Admitting: Family Medicine

## 2019-07-24 ENCOUNTER — Inpatient Hospital Stay (HOSPITAL_COMMUNITY): Payer: Medicare Other | Admitting: Certified Registered Nurse Anesthetist

## 2019-07-24 ENCOUNTER — Inpatient Hospital Stay (HOSPITAL_COMMUNITY)
Admit: 2019-07-24 | Discharge: 2019-07-24 | Disposition: A | Discharge status: Home / Self Care | Payer: Medicare Other | Attending: Physician Assistant | Admitting: Physician Assistant

## 2019-07-24 ENCOUNTER — Encounter (HOSPITAL_COMMUNITY)
Admission: EM | Disposition: A | Discharge status: Skilled Nursing Facility | Payer: Self-pay | Source: Home / Self Care | Attending: Internal Medicine

## 2019-07-24 ENCOUNTER — Other Ambulatory Visit (HOSPITAL_COMMUNITY): Payer: Medicare Other

## 2019-07-24 DIAGNOSIS — I4891 Unspecified atrial fibrillation: Secondary | ICD-10-CM

## 2019-07-24 DIAGNOSIS — I340 Nonrheumatic mitral (valve) insufficiency: Secondary | ICD-10-CM

## 2019-07-24 HISTORY — PX: TEE WITHOUT CARDIOVERSION: SHX5443

## 2019-07-24 HISTORY — PX: CARDIOVERSION: SHX1299

## 2019-07-24 LAB — CBC
HCT: 36.6 % — ABNORMAL LOW (ref 39.0–52.0)
Hemoglobin: 11.1 g/dL — ABNORMAL LOW (ref 13.0–17.0)
MCH: 33.7 pg (ref 26.0–34.0)
MCHC: 30.3 g/dL (ref 30.0–36.0)
MCV: 111.2 fL — ABNORMAL HIGH (ref 80.0–100.0)
Platelets: 158 10*3/uL (ref 150–400)
RBC: 3.29 MIL/uL — ABNORMAL LOW (ref 4.22–5.81)
RDW: 14.6 % (ref 11.5–15.5)
WBC: 7.5 10*3/uL (ref 4.0–10.5)
nRBC: 0 % (ref 0.0–0.2)

## 2019-07-24 LAB — BASIC METABOLIC PANEL
Anion gap: 10 (ref 5–15)
Anion gap: 6 (ref 5–15)
BUN: 47 mg/dL — ABNORMAL HIGH (ref 8–23)
BUN: 44 mg/dL — ABNORMAL HIGH (ref 8–23)
CO2: 29 mmol/L (ref 22–32)
CO2: 31 mmol/L (ref 22–32)
Calcium: 8.4 mg/dL — ABNORMAL LOW (ref 8.9–10.3)
Calcium: 8 mg/dL — ABNORMAL LOW (ref 8.9–10.3)
Chloride: 100 mmol/L (ref 98–111)
Chloride: 102 mmol/L (ref 98–111)
Creatinine, Ser: 1.39 mg/dL — ABNORMAL HIGH (ref 0.61–1.24)
Creatinine, Ser: 1.23 mg/dL (ref 0.61–1.24)
GFR calc Af Amer: 50 mL/min — ABNORMAL LOW (ref >60)
GFR calc Af Amer: 58 mL/min — ABNORMAL LOW (ref >60)
GFR calc non Af Amer: 43 mL/min — ABNORMAL LOW (ref >60)
GFR calc non Af Amer: 50 mL/min — ABNORMAL LOW (ref >60)
Glucose, Bld: 87 mg/dL (ref 70–99)
Glucose, Bld: 135 mg/dL — ABNORMAL HIGH (ref 70–99)
Potassium: 4.9 mmol/L (ref 3.5–5.1)
Potassium: 4.5 mmol/L (ref 3.5–5.1)
Sodium: 139 mmol/L (ref 135–145)
Sodium: 139 mmol/L (ref 135–145)

## 2019-07-24 LAB — SARS CORONAVIRUS 2 BY RT PCR (HOSPITAL ORDER, PERFORMED IN ~~LOC~~ HOSPITAL LAB): SARS Coronavirus 2: NEGATIVE

## 2019-07-24 LAB — MAGNESIUM: Magnesium: 2.1 mg/dL (ref 1.7–2.4)

## 2019-07-24 LAB — PROTIME-INR
INR: 1.7 — ABNORMAL HIGH (ref 0.8–1.2)
Prothrombin Time: 18.9 seconds — ABNORMAL HIGH (ref 11.4–15.2)

## 2019-07-24 SURGERY — ECHOCARDIOGRAM, TRANSESOPHAGEAL
Anesthesia: General

## 2019-07-24 MED ORDER — SODIUM CHLORIDE 0.9 % IV SOLN: INTRAVENOUS | Status: AC

## 2019-07-24 MED ORDER — PHENYLEPHRINE HCL-NACL 10-0.9 MG/250ML-% IV SOLN
INTRAVENOUS | Status: DC | PRN
Start: 2019-07-24 — End: 2019-07-24
  Administered 2019-07-24: 50 ug/min via INTRAVENOUS

## 2019-07-24 MED ORDER — SODIUM CHLORIDE 0.9 % IV SOLN: INTRAVENOUS | Status: DC

## 2019-07-24 MED ORDER — PROPOFOL 500 MG/50ML IV EMUL
INTRAVENOUS | Status: DC | PRN
Start: 2019-07-24 — End: 2019-07-24
  Administered 2019-07-24: 20 ug/kg/min via INTRAVENOUS

## 2019-07-24 MED ORDER — ETOMIDATE 2 MG/ML IV SOLN
INTRAVENOUS | Status: DC | PRN
Start: 2019-07-24 — End: 2019-07-24
  Administered 2019-07-24 (×3): 2 mg via INTRAVENOUS

## 2019-07-24 NOTE — TOC Progression Note (Signed)
Transition of Care [facility]) - Progression Note    Patient Details  Name: [name] [name] MRN: [id number] Date of Birth: May 31, 1924  Transition of Care [facility]) CM/SW Contact  Purcell Mouton, RN Phone Number: 07/24/2019, 9:37 AM  Clinical Narrative:    Son Clair Gulling was called for bed choice. Clair Gulling was in a meeting and will call this CM back.    Expected Discharge Plan: Skilled Nursing Facility Barriers to Discharge: No Barriers Identified  Expected Discharge Plan and Services Expected Discharge Plan: [place]   Discharge Planning Services: CM Consult Post Acute Care Choice: Nursing Home Living arrangements for the past 2 months: Single Family Home Expected Discharge Date: 07/18/19                                     Social Determinants of Health (SDOH) Interventions    Readmission Risk Interventions Readmission Risk Prevention Plan 07/19/2019  Transportation Screening Complete  PCP or Specialist Appt within 3-5 Days Complete  HRI or Study [place] Complete  Social Work Consult for [place] Planning/Counseling Complete  Palliative Care Screening Complete  Medication Review [employer] photographer) Complete  Some recent data might be hidden

## 2019-07-24 NOTE — Progress Notes (Signed)
Progress Note  Patient Name: [name] [name] Date of Encounter: 07/25/2019  [facility] Cardiologist: Larae Grooms, MD   Subjective   Pt resting comfortably, but does report being more dyspneic today.  Inpatient Medications    Scheduled Meds: . amiodarone  400 mg Oral BID  . apixaban  5 mg Oral BID  . capsaicin   Topical BID  . chlorhexidine  15 mL Mouth Rinse BID  . Chlorhexidine Gluconate Cloth  6 each Topical Q0600  . feeding supplement (ENSURE ENLIVE)  237 mL Oral Q24H  . mouth rinse  15 mL Mouth Rinse q12n4p  . metoprolol succinate  25 mg Oral Daily  . mirtazapine  7.5 mg Oral QHS  . predniSONE  10 mg Oral Q breakfast  . sodium chloride flush  10-40 mL Intracatheter Q12H  . umeclidinium-vilanterol  1 puff Inhalation Daily   Continuous Infusions: . sodium chloride 75 mL/hr at 07/24/19 1411   PRN Meds: acetaminophen, fluticasone, ipratropium-albuterol, ondansetron (ZOFRAN) IV, oxyCODONE **OR** oxyCODONE, polyethylene glycol, psyllium, senna-docusate, sodium chloride flush   Vital Signs    Vitals:   07/24/19 2215 07/25/19 0205 07/25/19 0500 07/25/19 0619  BP: (!) 134/98 109/68  119/65  Pulse: 75 (!) 59  67  Resp: 14 14  16   Temp: 98 F (36.7 C) 97.8 F (36.6 C)  97.8 F (36.6 C)  TempSrc: Oral Oral  Oral  SpO2: 93% 95%  95%  Weight:   74 kg   Height:        Intake/Output Summary (Last 24 hours) at 07/25/2019 0742 Last data filed at 07/24/2019 1836 Gross per 24 hour  Intake 300 ml  Output 200 ml  Net 100 ml   Last 3 Weights 07/25/2019 07/24/2019 07/23/2019  Weight (lbs) 163 lb 2.3 oz 161 lb 9.6 oz 157 lb 3 oz  Weight (kg) 74 kg 73.3 kg 71.3 kg      Telemetry    A-paced rhythm in the 60s - Personally Reviewed  ECG    A-paced rhythm, HR 61 - Personally Reviewed  Physical Exam   GEN: elderly male in No acute distress.   Neck: No JVD Cardiac: RRR, no murmurs, rubs, or gallops.  Respiratory: diminished in bases GI: Soft, nontender,  non-distended  MS: R > L LE edema; No deformity. Neuro:  Nonfocal  Psych: Normal affect   Labs    High Sensitivity Troponin:   Recent Labs  Lab 07/10/19 1620 07/10/19 1820  TROPONINIHS 68* 73*      Chemistry Recent Labs  Lab 07/24/19 0350 07/24/19 1540 07/25/19 0321  NA 139 139 140  K 4.9 4.5 4.3  CL 100 102 102  CO2 29 31 30   GLUCOSE 87 135* 87  BUN 47* 44* 42*  CREATININE 1.39* 1.23 1.35*  CALCIUM 8.4* 8.0* 7.9*  GFRNONAA 43* 50* 45*  GFRAA 50* 58* 52*  ANIONGAP 10 6 8      Hematology Recent Labs  Lab 07/23/19 0322 07/24/19 0350 07/25/19 0321  WBC 7.8 7.5 6.6  RBC 3.38* 3.29* 3.11*  HGB 11.3* 11.1* 10.4*  HCT 36.4* 36.6* 33.9*  MCV 107.7* 111.2* 109.0*  MCH 33.4 33.7 33.4  MCHC 31.0 30.3 30.7  RDW 14.6 14.6 14.7  PLT 149* 158 147*    BNPNo results for input(s): BNP, PROBNP in the last 168 hours.   DDimer No results for input(s): DDIMER in the last 168 hours.   Radiology    ECHO TEE  Result Date: 07/24/2019    TRANSESOPHOGEAL  ECHO REPORT   Patient Name:   [name] [name] Date of Exam: 07/24/2019 Medical Rec #:  [id number]         Height:       70.0 in Accession #:    [id number]        Weight:       161.6 lb Date of Birth:  02-23-1924          BSA:          1.906 m Patient Age:    86 years          BP:           109/57 mmHg Patient Gender: M                 HR:           75 bpm. Exam Location:  Inpatient Procedure: Transesophageal Echo, Cardiac Doppler and Color Doppler Indications:    atrial fibrillation  History:        Patient has prior history of Echocardiogram examinations. CHF;                 Mitral Valve Disease.  Sonographer:    Clayton Lefort RDCS (AE) Referring Phys: [id number] Melodi Happel NICOLE Bunny Lowdermilk PROCEDURE: The transesophogeal probe was passed without difficulty through the esophogus of the patient. Sedation performed by different physician. Image quality was good. The patient developed no complications during the procedure. The patient also received 6 mg of  iv Etomidate for conscious sedation. IMPRESSIONS  1. No thrombus was seen in the left atrial appendage or left atrium.  2. Left ventricular ejection fraction, by estimation, is <20%. The left ventricle has severely decreased function. The left ventricle demonstrates global hypokinesis. The left ventricular internal cavity size was moderately dilated. Left ventricular diastolic function could not be evaluated.  3. Right ventricular systolic function is moderately reduced. The right ventricular size is moderately enlarged. There is moderately elevated pulmonary artery systolic pressure. The estimated right ventricular systolic pressure is 02.5 mmHg.  4. Low LAA filling and emptying velocities. Left atrial size was severely dilated. No left atrial/left atrial appendage thrombus was detected.  5. Right atrial size was moderately dilated.  6. The mitral valve is degenerative. Moderate to severe mitral valve regurgitation. No evidence of mitral stenosis.  7. The tricuspid valve is degenerative. Tricuspid valve regurgitation is moderate.  8. The aortic valve is tricuspid. Aortic valve regurgitation is trivial. Moderate aortic valve stenosis.  9. There is Moderate (Grade III) atheroma plaque involving the descending aorta. 10. Evidence of atrial level shunting detected by color flow Doppler. There is a small patent foramen ovale with predominantly left to right shunting across the atrial septum. 11. The patient also received 6 mg of iv Etomidate for conscious sedation. Conclusion(s)/Recommendation(s): No LA/LAA thrombus identified. Successful cardioversion performed with restoration of normal sinus rhythm. FINDINGS  Left Ventricle: Left ventricular ejection fraction, by estimation, is <20%. The left ventricle has severely decreased function. The left ventricle demonstrates global hypokinesis. The left ventricular internal cavity size was moderately dilated. There is no left ventricular hypertrophy. Left ventricular  diastolic function could not be evaluated. Right Ventricle: The right ventricular size is moderately enlarged. No increase in right ventricular wall thickness. Right ventricular systolic function is moderately reduced. There is moderately elevated pulmonary artery systolic pressure. The tricuspid  regurgitant velocity is 2.84 m/s, and with an assumed right atrial pressure of 8 mmHg, the estimated right ventricular systolic pressure is 85.2 mmHg. Left Atrium:  Low LAA filling and emptying velocities. Left atrial size was severely dilated. No left atrial/left atrial appendage thrombus was detected. Right Atrium: Right atrial size was moderately dilated. Pericardium: Trivial pericardial effusion is present. Mitral Valve: The mitral valve is degenerative in appearance. Moderate to severe mitral valve regurgitation. No evidence of mitral valve stenosis. Tricuspid Valve: The tricuspid valve is degenerative in appearance. Tricuspid valve regurgitation is moderate. Aortic Valve: The aortic valve is tricuspid. . There is severe thickening and severe calcifcation of the aortic valve. Aortic valve regurgitation is trivial. Moderate aortic stenosis is present. There is severe thickening of the aortic valve. There is severe calcifcation of the aortic valve. Pulmonic Valve: The pulmonic valve was normal in structure. Pulmonic valve regurgitation is trivial. Aorta: The ascending aorta was not well visualized. There is moderate (Grade III) atheroma plaque involving the descending aorta. IAS/Shunts: Evidence of atrial level shunting detected by color flow Doppler. A small patent foramen ovale is detected with predominantly left to right shunting across the atrial septum. Additional Comments: A pacer wire is visualized.  TRICUSPID VALVE TR Peak grad:   32.3 mmHg TR Vmax:        284.00 cm/s Ena Dawley MD Electronically signed by Ena Dawley MD Signature Date/Time: 07/24/2019/12:30:43 PM    Final     Cardiac Studies   Echo  07/11/19: 1. Severely reduced LV function, 15-20%. LVOT VTI 8 cm. Left ventricular  ejection fraction, by estimation, is 15-20%. The left ventricle has  severely decreased function. The left ventricle demonstrates global  hypokinesis. Left ventricular diastolic  function could not be evaluated.  2. Right ventricular systolic function is moderately reduced. The right  ventricular size is moderately enlarged. There is moderately elevated  pulmonary artery systolic pressure. The estimated right ventricular  systolic pressure is 98.3 mmHg.  3. Left atrial size was moderately dilated.  4. Right atrial size was moderately dilated.  5. The mitral valve is degenerative. Moderate to severe mitral valve  regurgitation. No evidence of mitral stenosis.  6. The aortic valve is tricuspid and severely calcified with restricted  movement in systole. The gradients are quite low given the severely  reduced LV function (LVOT VTI 8 cm, SV=34 cc, SVi=18 cc/m2). AVA by VTI is  1.44 cm2 and DI is 0.34, consistent  with moderate aortic stenosis. If there are concerns for severe aortic  stenosis, would recommend an aortic valve calcium score for clarification.  The aortic valve is tricuspid. Aortic valve regurgitation is not  visualized. Moderate to severe aortic valve  stenosis. Aortic valve area, by VTI measures 1.44 cm. Aortic valve mean  gradient measures 5.0 mmHg. Aortic valve Vmax measures 1.50 m/s.   Patient Profile     86 y.o. male with sick sinus syndrome status post pacemaker, persistent atrial fibrillation,CKD 3,COPD, prior DVT and hypertensionadmitted with acute on chronic systolic and diastolic heart failure and atrial fibrillation with rapid ventricular response.  Assessment & Plan    Persistent atrial fibrillation with RVR s/p DCCV now with paced rhythm - Afib since 04/23/23 - new systolic heart failure --> DCCV planned but canceled due to plans for  Hospice, eliquis held - patient  reconsidered and wished to continue aggressive care, eliquis was restarted - TEE-guided DCCV yesterday, 07/24/19, with successful cardioversion to normal AV sequential pacing per device interrogation following shock - TEE confirmed EF of 15-20% - continue amiodarone 400 mg BID x 2 more days, then transition to 200 mg daily - continue eliquis 5 mg BID uninterrupted  x at least 4 weeks - continue 25 mg toprol daily - HR in the 50-70s on telemetry   Moderate to severe mitral regurgitation Moderate AS - seen on TEE - will defer to primary cardiologist for evaluation of mitraclip   Chronic systolic and diastolic heart failure Reduced RV function - EF 15-20% - last lasix dose was 07/22/19 - question if this was tachycardia mediated - will hopefully improve with restoration to sinus/AV pacing - weight is 163 lbs today, has steadily increased since 07/21/19 - pt does report more SOB this morning - consider restarting home dose of lasix 20 mg daily --> defer to Dr. Radford Pax   Acute on chronic kidney disease - sCr 1.35 (1.23), K 4.3   Elevated troponin - mild and flat, thought to be consistent with demand ischemia in the setting of RVR and CHF   Anticipating discharge today after Dr. Radford Pax sees. I will arrange cardiology follow up. Will need BMP in 1 week.     For questions or updates, please contact [place] Please consult [email or web address] for contact info under        Signed, Ledora Bottcher, PA  07/25/2019, 7:42 AM

## 2019-07-24 NOTE — Anesthesia Procedure Notes (Signed)
Procedure Name: MAC Date/Time: 07/24/2019 11:49 AM Performed by: Leonor Liv, CRNA Pre-anesthesia Checklist: Patient identified, Emergency Drugs available, Suction available, Patient being monitored and Timeout performed Patient Re-evaluated:Patient Re-evaluated prior to induction Oxygen Delivery Method: Nasal cannula Placement Confirmation: positive ETCO2 Dental Injury: Teeth and Oropharynx as per pre-operative assessment

## 2019-07-24 NOTE — Transfer of Care (Signed)
Immediate Anesthesia Transfer of Care Note  Patient: [name] [name]  Procedure(s) Performed: TRANSESOPHAGEAL ECHOCARDIOGRAM (TEE) (N/A ) CARDIOVERSION (N/A )  Patient Location: Endoscopy Unit  Anesthesia Type:MAC  Level of Consciousness: drowsy  Airway & Oxygen Therapy: Patient Spontanous Breathing and Patient connected to nasal cannula oxygen  Post-op Assessment: Report given to RN and Post -op Vital signs reviewed and stable  Post vital signs: Reviewed and stable  Last Vitals:  Vitals Value Taken Time  BP    Temp    Pulse 59 07/24/19 1216  Resp 20 07/24/19 1216  SpO2 96 % 07/24/19 1216  Vitals shown include unvalidated device data.  Last Pain:  Vitals:   07/24/19 1023  TempSrc: Oral  PainSc: 8       Patients Stated Pain Goal: 2 (53/79/43 2761)  Complications: No apparent anesthesia complications

## 2019-07-24 NOTE — Progress Notes (Signed)
°  Echocardiogram Echocardiogram Transesophageal has been performed.  Marybelle Killings 07/24/2019, 12:23 PM

## 2019-07-24 NOTE — Progress Notes (Signed)
PT Cancellation Note  Patient Details Name: [name] [name] MRN: [id number] DOB: [birth year]   Cancelled Treatment:    Reason Eval/Treat Not Completed: Patient at procedure or test/unavailable(Patient transfered to [facility] for TEE cardioversion today. Will follow up at later date/time when pt is available/able and as schedule allows.)   Verner Mould, DPT Physical Therapist with [facility] [phone]  07/24/2019 11:15 AM

## 2019-07-24 NOTE — Progress Notes (Signed)
PROGRESS NOTE    [name] [name]  ENI:[id number] DOB: 03/13/1924 DOA: 07/10/2019 PCP: Lawerance Cruel, MD   Brief Narrative:  86 year old with history of A. fib with pacemaker, CKD stage III, COPD presented with weakness, shortness of breath and lower extremity edema.  ER found to have pulmonary vascular congestion, atrial fibrillation with RVR started on Lasix and Cardizem.  Pacemaker was interrogated which showed 78% burden of A. fib.  Echocardiogram showed an EF of 15-20% with global hypokinesis.  Amiodarone and beta-blocker was stopped, Eliquis reduced to 2.5 mg.  Patient was going to be discharged.  Hospice facility on 6/21 but son rescinded hospice decision and wants for cardiology evaluation.  Plans for TEE cardioversion today   Assessment & Plan:   Active Problems:   Atrial fibrillation with RVR (HCC)   Acute exacerbation of CHF (congestive heart failure) (HCC)   Stage 3b chronic kidney disease   Acute combined systolic and diastolic heart failure (HCC)   Hypotension   Palliative care by specialist   Goals of care, counseling/discussion   General weakness   Acute congestive heart failure (HCC)   Persistent A. Fib, intermittent RVR. -Cardiology team following.  TEE cardioversion today -Amiodarone 400 mg twice daily, Eliquis 5 mg, Toprol-XL 25 mg daily -Replete electrolytes as necessary  Acute congestive heart failure with reduced ejection fraction, class III. -This is complicated by atrial fibrillation with RVR.  Pacemaker showed 78% A. fib burden.  Echocardiogram showed EF 15-20% with global hypokinesis, elevated pulmonary arterial pressure, severe MR -Hold off on Lasix due to intravascular signs of dehydration, another day of IV fluids. -Cardiology following.  AKI on CKD stage 3B:  -Creatinine peaked at 1.5; slightly improved to 1.39 with hydration overnight.  Generalized weakness/debility/fall; Spinal Stenosis.  PT/OT-SNF  Bilateral lower extremity  edema/wound Lower extremity Dopplers negative for DVT.  Status post doxycycline  Iron deficiency anemia -Iron supplements, bowel regimen.  GERD -PPI  History of COPD -As needed bronchodilators, daily prednisone.  DVT prophylaxis: Eliquis Code Status: DNR Family Communication: None  Status is: Inpatient  Remains inpatient appropriate because:IV treatments appropriate due to intensity of illness or inability to take PO   Dispo: The patient is from: Home              Anticipated d/c is to: SNF              Anticipated d/c date is: 1 day              Patient currently is not medically stable to d/c.  Plans for TEE cardioversion today then SNF placement  Subjective: This morning patient is resting comfortably and denies any complaints.  Overall doing better heart rate is better.  Drowsy, his urine is dark in the canister Plan.  Review of Systems Otherwise negative except as per HPI, including: General = no fevers, chills, dizziness,  fatigue HEENT/EYES = negative for loss of vision, double vision, blurred vision,  sore throa Cardiovascular= negative for chest pain, palpitation Respiratory/lungs= negative for shortness of breath, cough, wheezing; hemoptysis,  Gastrointestinal= negative for nausea, vomiting, abdominal pain Genitourinary= negative for Dysuria MSK = Negative for arthralgia, myalgias Neurology= Negative for headache, numbness, tingling  Psychiatry= Negative for suicidal and homocidal ideation Skin= Negative for Rash   Examination:  Constitutional: Not in acute distress Respiratory: Clear to auscultation bilaterally Cardiovascular: Normal sinus rhythm, no rubs Abdomen: Nontender nondistended good bowel sounds Musculoskeletal: No edema noted Skin: No rashes seen Neurologic: CN 2-12 grossly intact.  And nonfocal Psychiatric: Normal judgment and insight. Alert and oriented x 3. Normal mood.   Condom catheter in place.   Objective: Vitals:   07/24/19 0445  07/24/19 0448 07/24/19 0752 07/24/19 1023  BP: 104/66   (!) 142/78  Pulse: 62   80  Resp: 18   16  Temp: 97.8 F (36.6 C)   97.8 F (36.6 C)  TempSrc: Oral   Oral  SpO2: 97%  98% 96%  Weight:  73.3 kg    Height:        Intake/Output Summary (Last 24 hours) at 07/24/2019 1036 Last data filed at 07/23/2019 1751 Gross per 24 hour  Intake 595.81 ml  Output 600 ml  Net -4.19 ml   Filed Weights   07/21/19 0600 07/23/19 0500 07/24/19 0448  Weight: 69.5 kg 71.3 kg 73.3 kg     Data Reviewed:   CBC: Recent Labs  Lab 07/19/19 1411 07/20/19 0405 07/22/19 0410 07/23/19 0322 07/24/19 0350  WBC 8.5 7.3 6.4 7.8 7.5  NEUTROABS 7.6  --   --   --   --   HGB 12.6* 12.4* 10.9* 11.3* 11.1*  HCT 40.2 40.3 34.8* 36.4* 36.6*  MCV 108.1* 110.7* 107.4* 107.7* 111.2*  PLT 142* 122* 128* 149* 696   Basic Metabolic Panel: Recent Labs  Lab 07/20/19 0405 07/21/19 0424 07/22/19 0410 07/23/19 0322 07/24/19 0350  NA 140 136 136 136 139  K 3.7 3.5 3.7 4.5 4.9  CL 100 98 97* 96* 100  CO2 30 30 29  32 29  GLUCOSE 91 100* 101* 105* 87  BUN 46* 45* 47* 50* 47*  CREATININE 1.09 1.08 1.07 1.42* 1.39*  CALCIUM 8.0* 7.7* 7.9* 8.3* 8.4*  MG  --   --  2.0 2.1 2.1   GFR: Estimated Creatinine Clearance: 34.3 mL/min (A) (by C-G formula based on SCr of 1.39 mg/dL (H)). Liver Function Tests: No results for input(s): AST, ALT, ALKPHOS, BILITOT, PROT, ALBUMIN in the last 168 hours. No results for input(s): LIPASE, AMYLASE in the last 168 hours. No results for input(s): AMMONIA in the last 168 hours. Coagulation Profile: No results for input(s): INR, PROTIME in the last 168 hours. Cardiac Enzymes: No results for input(s): CKTOTAL, CKMB, CKMBINDEX, TROPONINI in the last 168 hours. BNP (last 3 results) No results for input(s): PROBNP in the last 8760 hours. HbA1C: No results for input(s): HGBA1C in the last 72 hours. CBG: No results for input(s): GLUCAP in the last 168 hours. Lipid Profile: No  results for input(s): CHOL, HDL, LDLCALC, TRIG, CHOLHDL, LDLDIRECT in the last 72 hours. Thyroid Function Tests: No results for input(s): TSH, T4TOTAL, FREET4, T3FREE, THYROIDAB in the last 72 hours. Anemia Panel: No results for input(s): VITAMINB12, FOLATE, FERRITIN, TIBC, IRON, RETICCTPCT in the last 72 hours. Sepsis Labs: No results for input(s): PROCALCITON, LATICACIDVEN in the last 168 hours.  No results found for this or any previous visit (from the past 240 hour(s)).       Radiology Studies: No results found.      Scheduled Meds: . [MAR Hold] amiodarone  400 mg Oral BID  . [MAR Hold] apixaban  5 mg Oral BID  . [MAR Hold] capsaicin   Topical BID  . [MAR Hold] chlorhexidine  15 mL Mouth Rinse BID  . [MAR Hold] Chlorhexidine Gluconate Cloth  6 each Topical Q0600  . [MAR Hold] feeding supplement (ENSURE ENLIVE)  237 mL Oral Q24H  . [MAR Hold] mouth rinse  15 mL Mouth Rinse q12n4p  . [  MAR Hold] metoprolol succinate  25 mg Oral Daily  . [MAR Hold] mirtazapine  7.5 mg Oral QHS  . [MAR Hold] predniSONE  10 mg Oral Q breakfast  . [MAR Hold] sodium chloride flush  10-40 mL Intracatheter Q12H  . [MAR Hold] umeclidinium-vilanterol  1 puff Inhalation Daily   Continuous Infusions: . sodium chloride    . sodium chloride       LOS: 14 days   Time spent= 35 mins    Adrin Julian Arsenio Loader, MD [facility]  If 7PM-7AM, please contact night-coverage  07/24/2019, 10:36 AM

## 2019-07-24 NOTE — Interval H&P Note (Signed)
History and Physical Interval Note:  07/24/2019 10:34 AM  [name] [name]  has presented today for surgery, with the diagnosis of AFIB.  The various methods of treatment have been discussed with the patient and family. After consideration of risks, benefits and other options for treatment, the patient has consented to  Procedure(s): TRANSESOPHAGEAL ECHOCARDIOGRAM (TEE) (N/A) CARDIOVERSION (N/A) as a surgical intervention.  The patient's history has been reviewed, patient examined, no change in status, stable for surgery.  I have reviewed the patient's chart and labs.  Questions were answered to the patient's satisfaction.     Ena Dawley

## 2019-07-24 NOTE — Anesthesia Postprocedure Evaluation (Signed)
Anesthesia Post Note  Patient: [name] [name]  Procedure(s) Performed: TRANSESOPHAGEAL ECHOCARDIOGRAM (TEE) (N/A ) CARDIOVERSION (N/A )     Patient location during evaluation: PACU Anesthesia Type: General Level of consciousness: awake and alert Pain management: pain level controlled Vital Signs Assessment: post-procedure vital signs reviewed and stable Respiratory status: spontaneous breathing, nonlabored ventilation and respiratory function stable Cardiovascular status: blood pressure returned to baseline and stable Postop Assessment: no apparent nausea or vomiting Anesthetic complications: no    Last Vitals:  Vitals:   07/24/19 1244 07/24/19 1326  BP: 101/83 (!) 150/92  Pulse: 61 68  Resp: 13 14  Temp:    SpO2: 99% 98%    Last Pain:  Vitals:   07/24/19 1320  TempSrc:   PainSc: 9                  Nazaire Cordial A.

## 2019-07-24 NOTE — Care Management Important Message (Signed)
Important Message  Patient Details IM Letter given to Gabriel Earing RN Case Manager to present to the Patient Name: [name] [name] MRN: [id number] Date of Birth: April 30, 1924   Medicare Important Message Given:  Yes     [name] 07/24/2019, 12:58 PM

## 2019-07-24 NOTE — CV Procedure (Addendum)
     Transesophageal Echocardiogram Note  [name] [name] [id number] 15-Jan-1925  Procedure: Transesophageal Echocardiogram Indications: atrial fibrillation  Procedure Details Consent: Obtained Time Out: Verified patient identification, verified procedure, site/side was marked, verified correct patient position, special equipment/implants available, Radiology Safety Procedures followed,  medications/allergies/relevent history reviewed, required imaging and test results available.  Performed  Medications: The patient has been on adequate anticoagulation.  The patient received 6 mg of IV etomidate and 16 mf of IV propofol administered by anesthesia staff for deep sedation.  Synchronous cardioversion was performed at 120 joules x 1.  Left Ventrical:  15-20% with global hypokinesis   Mitral Valve: mod to severe MR  Left Atrium/ Left atrial appendage: no evidence for a thrombus  Atrial septum: + PFO by Color Doppler with left to right flow  Aorta: moderate non-mobile atheroma  Complications: No apparent complications Patient did tolerate procedure well.  Ena Dawley, MD, [facility] 07/24/2019, 12:15 PM     Cardioversion Note  [name] [name] [id number] [birth year]  Procedure: DC Cardioversion Indications: atrial fibrillation  Procedure Details Consent: Obtained Time Out: Verified patient identification, verified procedure, site/side was marked, verified correct patient position, special equipment/implants available, Radiology Safety Procedures followed,  medications/allergies/relevent history reviewed, required imaging and test results available.  Performed  The patient has been on adequate anticoagulation.  The patient received 6 mg of IV etomidate and 16 mf of IV propofol administered by anesthesia staff for deep sedation.  Synchronous cardioversion was performed at 120 joules x 1.  The cardioversion was successful. The Medtronic pacemaker was interrogated post cardioversion  showing normal AV sequential pacing.   Complications: No apparent complications Patient did tolerate procedure well.   Ena Dawley, MD, [facility] 07/24/2019, 12:15 PM

## 2019-07-24 NOTE — TOC Progression Note (Signed)
Transition of Care [facility]) - Progression Note    Patient Details  Name: [name] [name] MRN: [id number] Date of Birth: 1924/07/28  [facility] of [facility] CM/SW Contact  Purcell Mouton, RN Phone Number: 07/24/2019, 10:38 AM  Clinical Narrative:    Pt's don Dr. Thomasene Lot selected [facility]/SNF for patient.     Expected Discharge Plan: Skilled Nursing Facility Barriers to Discharge: No Barriers Identified  Expected Discharge Plan and Services Expected Discharge Plan: [place]   Discharge Planning Services: CM Consult Post Acute Care Choice: Nursing Home Living arrangements for the past 2 months: Single Family Home Expected Discharge Date: 07/18/19                                     Social Determinants of Health (SDOH) Interventions    Readmission Risk Interventions Readmission Risk Prevention Plan 07/19/2019  Transportation Screening Complete  PCP or Specialist Appt within 3-5 Days Complete  HRI or [place] Complete  Social Work Consult for [place] Planning/Counseling Complete  Palliative Care Screening Complete  Medication Review [employer]) Complete  Some recent data might be hidden

## 2019-07-24 NOTE — Anesthesia Preprocedure Evaluation (Addendum)
Anesthesia Evaluation    Reviewed: Allergy & Precautions, Patient's Chart, lab work & pertinent test results  Airway Mallampati: II  TM Distance: >3 FB Neck ROM: Full    Dental  (+) Edentulous Upper, Edentulous Lower   Pulmonary shortness of breath, sleep apnea and Continuous Positive Airway Pressure Ventilation , COPD,  COPD inhaler, former smoker,    Pulmonary exam normal breath sounds clear to auscultation       Cardiovascular hypertension, +CHF, + DOE and + DVT  + dysrhythmias Atrial Fibrillation + pacemaker  Rhythm:Irregular Rate:Normal  ECG: a-fib  ECHO: 1. Severely reduced LV function, 15-20%. LVOT VTI 8 cm. Left ventricular ejection fraction, by estimation, is 15-20%. The left ventricle has severely decreased function. The left ventricle demonstrates global hypokinesis. Left ventricular diastolic function could not be evaluated. 2. Right ventricular systolic function is moderately reduced. The right ventricular size is moderately enlarged. There is moderately elevated pulmonary artery systolic pressure. The estimated right ventricular systolic pressure is 39.0 mmHg. 3. Left atrial size was moderately dilated. 4. Right atrial size was moderately dilated. 5. The mitral valve is degenerative. Moderate to severe mitral valve regurgitation. No evidence of mitral stenosis. 6. The aortic valve is tricuspid and severely calcified with restricted movement in systole. The gradients are quite low given the severely reduced LV function (LVOT VTI 8 cm, SV=34 cc, SVi=18 cc/m2). AVA by VTI is 1.44 cm2 and DI is 0.34, consistent with moderate aortic stenosis. If there are concerns for severe aortic stenosis, would recommend an aortic valve calcium score for clarification. The aortic valve is tricuspid. Aortic valve regurgitation is not visualized. Moderate to severe aortic valve stenosis. Aortic valve area, by VTI measures 1.44 cm. Aortic  valve mean gradient measures 5.0 mmHg. Aortic valve Vmax measures 1.50 m/s.   Neuro/Psych  Headaches, Anxiety    GI/Hepatic hiatal hernia, GERD  Medicated and Controlled,(+)     substance abuse  ,   Endo/Other  negative endocrine ROS  Renal/GU negative Renal ROS     Musculoskeletal  (+) Arthritis , narcotic dependentlow back pain   Abdominal   Peds  Hematology  (+) anemia ,   Anesthesia Other Findings A-FIB  Reproductive/Obstetrics                             Anesthesia Physical Anesthesia Plan  ASA: IV  Anesthesia Plan: General   Post-op Pain Management:    Induction: Intravenous  PONV Risk Score and Plan: 2 and Treatment may vary due to age or medical condition  Airway Management Planned: Nasal Cannula  Additional Equipment:   Intra-op Plan:   Post-operative Plan:   Informed Consent: I have reviewed the patients History and Physical, chart, labs and discussed the procedure including the risks, benefits and alternatives for the proposed anesthesia with the patient or authorized representative who has indicated his/her understanding and acceptance.   Patient has DNR.  Discussed DNR with patient and Suspend DNR.   Dental advisory given  Plan Discussed with: CRNA  Anesthesia Plan Comments:        Anesthesia Quick Evaluation

## 2019-07-25 ENCOUNTER — Other Ambulatory Visit: Payer: Self-pay | Admitting: Physician Assistant

## 2019-07-25 DIAGNOSIS — I429 Cardiomyopathy, unspecified: Secondary | ICD-10-CM

## 2019-07-25 LAB — BASIC METABOLIC PANEL
Anion gap: 8 (ref 5–15)
BUN: 42 mg/dL — ABNORMAL HIGH (ref 8–23)
CO2: 30 mmol/L (ref 22–32)
Calcium: 7.9 mg/dL — ABNORMAL LOW (ref 8.9–10.3)
Chloride: 102 mmol/L (ref 98–111)
Creatinine, Ser: 1.35 mg/dL — ABNORMAL HIGH (ref 0.61–1.24)
GFR calc Af Amer: 52 mL/min — ABNORMAL LOW (ref >60)
GFR calc non Af Amer: 45 mL/min — ABNORMAL LOW (ref >60)
Glucose, Bld: 87 mg/dL (ref 70–99)
Potassium: 4.3 mmol/L (ref 3.5–5.1)
Sodium: 140 mmol/L (ref 135–145)

## 2019-07-25 LAB — CBC
HCT: 33.9 % — ABNORMAL LOW (ref 39.0–52.0)
Hemoglobin: 10.4 g/dL — ABNORMAL LOW (ref 13.0–17.0)
MCH: 33.4 pg (ref 26.0–34.0)
MCHC: 30.7 g/dL (ref 30.0–36.0)
MCV: 109 fL — ABNORMAL HIGH (ref 80.0–100.0)
Platelets: 147 10*3/uL — ABNORMAL LOW (ref 150–400)
RBC: 3.11 MIL/uL — ABNORMAL LOW (ref 4.22–5.81)
RDW: 14.7 % (ref 11.5–15.5)
WBC: 6.6 10*3/uL (ref 4.0–10.5)
nRBC: 0 % (ref 0.0–0.2)

## 2019-07-25 LAB — MAGNESIUM: Magnesium: 2 mg/dL (ref 1.7–2.4)

## 2019-07-25 MED ORDER — AMIODARONE HCL 200 MG PO TABS: ORAL_TABLET | ORAL | 0 refills | Status: DC

## 2019-07-25 MED ORDER — METOPROLOL SUCCINATE ER 25 MG PO TB24: 25.0000 mg | ORAL_TABLET | Freq: Every day | ORAL | 0 refills | Status: AC

## 2019-07-25 MED ORDER — OXYCODONE HCL 5 MG PO TABS: 5.0000 mg | ORAL_TABLET | Freq: Four times a day (QID) | ORAL | 0 refills | Status: DC | PRN

## 2019-07-25 MED ORDER — METOPROLOL SUCCINATE ER 25 MG PO TB24: 25.0000 mg | ORAL_TABLET | Freq: Every day | ORAL | 0 refills | Status: DC

## 2019-07-25 MED ORDER — POLYETHYLENE GLYCOL 3350 17 G PO PACK: 17.0000 g | PACK | Freq: Every day | ORAL | 0 refills | Status: AC | PRN

## 2019-07-25 MED ORDER — AMIODARONE HCL 200 MG PO TABS: 200.0000 mg | ORAL_TABLET | Freq: Two times a day (BID) | ORAL | 0 refills | Status: DC

## 2019-07-25 MED ORDER — SENNOSIDES-DOCUSATE SODIUM 8.6-50 MG PO TABS
1.0000 | ORAL_TABLET | Freq: Every evening | ORAL | Status: AC | PRN
Start: 2019-07-25 — End: ?

## 2019-07-25 MED ORDER — AMIODARONE HCL 200 MG PO TABS
200.0000 mg | ORAL_TABLET | Freq: Two times a day (BID) | ORAL | Status: DC
  Administered 2019-07-25: 200 mg via ORAL
  Filled 2019-07-25: qty 1

## 2019-07-25 MED ORDER — LOSARTAN POTASSIUM 25 MG PO TABS
12.5000 mg | ORAL_TABLET | Freq: Every day | ORAL | 0 refills | Status: DC
Start: 2019-07-25 — End: 2019-07-25

## 2019-07-25 MED ORDER — LOSARTAN POTASSIUM 25 MG PO TABS
12.5000 mg | ORAL_TABLET | Freq: Every day | ORAL | 0 refills | Status: DC
Start: 2019-07-25 — End: 2019-08-31

## 2019-07-25 NOTE — Plan of Care (Signed)
Pt with discharge to [facility].

## 2019-07-25 NOTE — Discharge Summary (Addendum)
Physician Discharge Summary  [name] [name] FKC:[id number] DOB: November 24, 1924 DOA: 07/10/2019  PCP: Lawerance Cruel, MD  Admit date: 07/10/2019 Discharge date: 07/25/2019  Admitted From: Home Disposition: SNF  Recommendations for Outpatient Follow-up:  1. Follow up with PCP in 1-2 weeks 2. Please obtain BMP/CBC in one week your next doctors visit.  3. Amiodarone 200 mg twice daily 4. Losartan 12.5 mg daily 5. Resume home 20 mg oral Lasix 6. Eliquis 2.5 mg twice daily X 4 weeks at least 7. Toprol-XL 25 mg daily 8. Follow-up outpatient cardiology 9. As needed pain meds with bowel regimen  Wound Care instructions. Reason for Consult: weeping and edema Patient with self reported history of venous stasis and graft placed 2 years ago to the LLE medial malleolar region.  Reports has compression stockings at home but having difficulty with donning recently. Hemosiderin staining noted R>L.  No open wounds currently. No active weeping Wound type: none Elevate legs as much as possible. Reinitiate use of compression stockings at DC.  Appears patient may need SNF placement; they can assist with donning and doffing daily.  Discharge Condition: Stable CODE STATUS: DNR Diet recommendation: Heart healthy  Brief/Interim Summary: 86 year old with history of A. fib with pacemaker, CKD stage III, COPD presented with weakness, shortness of breath and lower extremity edema.  ER found to have pulmonary vascular congestion, atrial fibrillation with RVR started on Lasix and Cardizem.  Pacemaker was interrogated which showed 78% burden of A. fib.  Echocardiogram showed an EF of 15-20% with global hypokinesis.  Amiodarone and beta-blocker was stopped, Eliquis reduced to 2.5 mg.  Patient was going to be discharged.  Hospice facility on 6/21 but son rescinded hospice decision and wants for cardiology evaluation.    Given CHF and atrial fibrillation he underwent TEE cardioversion on 07/24/2019 which he tolerated well  and was successful.  Cardiac medications as recommended above.  PT recommended SNF therefore arrangements made. Today patient medically remained stable to be discharged to SNF.   Assessment & Plan:   Active Problems:   Atrial fibrillation with RVR (HCC)   Acute exacerbation of CHF (congestive heart failure) (HCC)   Stage 3b chronic kidney disease   Acute combined systolic and diastolic heart failure (HCC)   Hypotension   Palliative care by specialist   Goals of care, counseling/discussion   General weakness   Acute congestive heart failure (HCC)   Persistent A. Fib, intermittent RVR.  Resolved with cardioversion -Status post TEE cardioversion 6/8 which was successful.  Patient will remain on amiodarone 200 mg twice daily, losartan 12.5 mg daily, Toprol-XL 25 mg daily and Eliquis 2.5 mg twice daily.  Follow-up outpatient with cardiology  Acute congestive heart failure with reduced ejection fraction, class II -This is complicated by atrial fibrillation with RVR.  Pacemaker showed 78% A. fib burden.  Echocardiogram showed EF 15-20% with global hypokinesis, elevated pulmonary arterial pressure, severe MR -We will plan to resume home 20 mg oral Lasix daily.  Daily assess his weight and volume status to further determine any changes in his Lasix. -Continue Toprol, losartan  AKI on CKD stage 3B:  -Creatinine peaked at 1.5; slightly improved to 1.39 with hydration overnight.  Generalized weakness/debility/fall; Spinal Stenosis.  PT/OT-SNF  Bilateral lower extremity edema/wound Lower extremity Dopplers negative for DVT.  Status post doxycycline  Iron deficiency anemia -Iron supplements, bowel regimen.  GERD -PPI  History of COPD -As needed bronchodilators, daily prednisone.    Discharge Diagnoses:  Active Problems:   Atrial fibrillation  with RVR ([place])   Acute exacerbation of CHF (congestive heart failure) (HCC)   Stage 3b chronic kidney disease   Acute combined  systolic and diastolic heart failure ([place])   Hypotension   Palliative care by specialist   Goals of care, counseling/discussion   General weakness   Acute congestive heart failure [facility])    Consultations:  Cardiology  Subjective: Remains in normal sinus rhythm, no complaints this morning.  Discharge Exam: Vitals:   07/25/19 0619 07/25/19 1111  BP: 119/65 (!) 153/100  Pulse: 67 62  Resp: 16   Temp: 97.8 F (36.6 C)   SpO2: 95% 96%   Vitals:   07/25/19 0205 07/25/19 0500 07/25/19 0619 07/25/19 1111  BP: 109/68  119/65 (!) 153/100  Pulse: (!) 59  67 62  Resp: 14  16   Temp: 97.8 F (36.6 C)  97.8 F (36.6 C)   TempSrc: Oral  Oral   SpO2: 95%  95% 96%  Weight:  74 kg    Height:        General: Pt is alert, awake, not in acute distress Cardiovascular: RRR, S1/S2 +, no rubs, no gallops Respiratory: Minimal bibasilar crackles. Abdominal: Soft, NT, ND, bowel sounds + Extremities: no edema, no cyanosis  Discharge Instructions   Allergies as of 07/25/2019   No Known Allergies     Medication List    TAKE these medications   acetaminophen 650 MG CR tablet Commonly known as: TYLENOL Take 650 mg by mouth 2 (two) times daily.   albuterol 108 (90 Base) MCG/ACT inhaler Commonly known as: VENTOLIN HFA Inhale 2 puffs into the lungs every 6 (six) hours as needed for wheezing or shortness of breath.   amiodarone 200 MG tablet Commonly known as: PACERONE Take 1 tablet (200 mg total) by mouth 2 (two) times daily.   Anoro Ellipta 62.5-25 MCG/INH Aepb Generic drug: umeclidinium-vilanterol Inhale 1 puff into the lungs daily.   apixaban 2.5 MG Tabs tablet Commonly known as: Eliquis Take 1 tablet (2.5 mg total) by mouth 2 (two) times daily. What changed:   medication strength  how much to take   CALCIUM-D PO Take 1 tablet by mouth every evening.   diphenhydrAMINE 25 MG tablet Commonly known as: BENADRYL Take 25 mg by mouth daily as needed for allergies.    ferrous sulfate 325 (65 FE) MG tablet Take 650 mg by mouth daily with breakfast.   fluticasone 50 MCG/ACT nasal spray Commonly known as: FLONASE Place 1 spray into both nostrils daily as needed for allergies or rhinitis.   Folic Acid-Vit Q2-VZD G38 2.5-25-1 MG Tabs tablet Commonly known as: FOLBEE Take 1 tablet by mouth daily.   furosemide 20 MG tablet Commonly known as: LASIX Take 20 mg by mouth daily.   losartan 25 MG tablet Commonly known as: Cozaar Take 0.5 tablets (12.5 mg total) by mouth daily.   metoprolol succinate 25 MG 24 hr tablet Commonly known as: TOPROL-XL Take 1 tablet (25 mg total) by mouth daily.   Myrbetriq 50 MG Tb24 tablet Generic drug: mirabegron ER Take 50 mg by mouth daily.   oxyCODONE 5 MG immediate release tablet Commonly known as: Oxy IR/ROXICODONE Take 1 tablet (5 mg total) by mouth every 6 (six) hours as needed for severe pain.   oxymetazoline 0.05 % nasal spray Commonly known as: AFRIN Place 1 spray into both nostrils 2 (two) times daily as needed for congestion.   pantoprazole 40 MG tablet Commonly known as: Protonix Take 1 tablet (40  mg total) by mouth daily before breakfast.   polyethylene glycol 17 g packet Commonly known as: MiraLax Take 17 g by mouth daily as needed.   predniSONE 10 MG tablet Commonly known as: DELTASONE Take 10 mg by mouth daily with breakfast. What changed: Another medication with the same name was removed. Continue taking this medication, and follow the directions you see here.   PRESERVISION AREDS 2 PO Take 1 each by mouth in the morning and at bedtime.   psyllium 28 % packet Commonly known as: METAMUCIL SMOOTH TEXTURE Take 1 packet by mouth daily as needed. Mix with 8 oz liquid   senna-docusate 8.6-50 MG tablet Commonly known as: Senokot-S Take 1 tablet by mouth at bedtime as needed for mild constipation.   vitamin B-12 500 MCG tablet Commonly known as: CYANOCOBALAMIN Take 500 mcg by mouth. Three  days a week   VITAMIN D3 PO Take 2,000 Units by mouth daily.       Contact information for follow-up providers    Lawerance Cruel, MD Follow up in 1 week(s).   Specialty: Family Medicine Contact information: [place] [address] [phone]        Jettie Booze, MD .   Specialties: Cardiology, Radiology, Interventional Cardiology Contact information: [address] [place] [address] [phone]        Deboraha Sprang, MD .   Specialty: Cardiology Contact information: [phone] [address] [place] [address] [phone]        [place] [facility] Follow up in 1 week(s).   Specialty: Cardiology Why: BMP lab - no appt needed. present to [facility] office for lab in 1 week. Do not have be be fasting Contact information: [address], [place] [phone]           Contact information for after-discharge care    Destination    [facility] Preferred SNF .   Service: Skilled Nursing Contact information: [place] [phone]                 No Known Allergies  You were cared for by a hospitalist during your hospital stay. If you have any questions about your discharge medications or the care you received while you were in the hospital after you are discharged, you can call the unit and asked to speak with the hospitalist on call if the hospitalist that took care of you is not available. Once you are discharged, your primary care physician will handle any further medical issues. Please note that no refills for any discharge medications will be authorized once you are discharged, as it is imperative that you return to your primary care physician (or establish a relationship with a primary care physician if you do not have one) for your aftercare needs so that they can reassess your  need for medications and monitor your lab values.   Procedures/Studies: CT Head Wo Contrast  Result Date: 07/10/2019 CLINICAL DATA:  Progressive weakness fall 3 weeks ago EXAM: CT HEAD WITHOUT CONTRAST TECHNIQUE: Contiguous axial images were obtained from the base of the skull through the vertex without intravenous contrast. COMPARISON:  CT brain 12/17/2017 FINDINGS: Brain: No acute territorial infarction, hemorrhage or intracranial mass. Moderate atrophy. Mild chronic small vessel ischemic change of the white matter. Stable ventricle size. Slight asymmetric enlargement of left convexity extra-axial CSF density potentially due  to chronic subdural effusion. Vascular: No hyperdense vessels.  Carotid vascular calcification Skull: Normal. Negative for fracture or focal lesion. Sinuses/Orbits: Postsurgical changes of the right maxillary sinus. Mucosal thickening. Other: None IMPRESSION: 1. No CT evidence for acute intracranial abnormality. 2. Atrophy and chronic small vessel ischemic change of the white matter Electronically Signed   By: Donavan Foil M.D.   On: 07/10/2019 18:09   CT Cervical Spine Wo Contrast  Result Date: 07/10/2019 CLINICAL DATA:  Progressive weakness, history of fall 3 weeks ago EXAM: CT CERVICAL SPINE WITHOUT CONTRAST TECHNIQUE: Multidetector CT imaging of the cervical spine was performed without intravenous contrast. Multiplanar CT image reconstructions were also generated. COMPARISON:  CT 12/17/2017 FINDINGS: Alignment: Trace retrolisthesis C4 on C5 and anterolisthesis C7 on T1 without change. Facet alignment is maintained Skull base and vertebrae: No acute fracture. No primary bone lesion or focal pathologic process. Soft tissues and spinal canal: No prevertebral fluid or swelling. No visible canal hematoma. Disc levels: Moderate severe degenerative changes throughout the cervical spine with multiple level disc space narrowing and osteophyte. Facet degenerative change at multiple levels  with multiple level bilateral foraminal stenosis. Upper chest: Bilateral pleural effusions Other: None IMPRESSION: 1. Diffuse degenerative changes without acute fracture 2. Pleural effusions at the apices Electronically Signed   By: Donavan Foil M.D.   On: 07/10/2019 18:14   DG Chest Port 1 View  Result Date: 07/10/2019 CLINICAL DATA:  Generalized weakness, bilateral lower extremity edema EXAM: PORTABLE CHEST 1 VIEW COMPARISON:  06/20/2019 FINDINGS: Single frontal view of the chest demonstrates increased veiling opacities at the lung bases consistent with progressive consolidation and enlarging bilateral pleural effusions. Increased central vascular congestion. Cardiac silhouette is stable. No pneumothorax. Dual lead pacer unchanged. IMPRESSION: 1. Worsening volume status with progressive congestive heart failure. Electronically Signed   By: Randa Ngo M.D.   On: 07/10/2019 16:57   ECHOCARDIOGRAM COMPLETE  Result Date: 07/11/2019    ECHOCARDIOGRAM REPORT   Patient Name:   [name] [name] Date of Exam: 07/11/2019 Medical Rec #:  [id number]         Height:       70.0 in Accession #:    [id number]        Weight:       155.0 lb Date of Birth:  21-Oct-1924          BSA:          1.873 m Patient Age:    79 years          BP:           124/78 mmHg Patient Gender: M                 HR:           70 bpm. Exam Location:  Inpatient Procedure: 2D Echo Indications:    CHF 428.31  History:        Patient has prior history of Echocardiogram examinations, most                 recent 11/21/2012. Pacemaker, COPD; Risk Factors:Hypertension and                 Former Smoker. Pulmonary hypertension. sinus node dysfunction.  Sonographer:    Jannett Celestine RDCS (AE) Referring Phys: [id number] ALEXIS HUGELMEYER  Sonographer Comments: limited mobility IMPRESSIONS  1. Severely reduced LV function, 15-20%. LVOT VTI 8 cm. Left ventricular ejection fraction, by estimation, is 15-20%. The left ventricle has severely decreased  function. The  left ventricle demonstrates global hypokinesis. Left ventricular diastolic function could not be evaluated.  2. Right ventricular systolic function is moderately reduced. The right ventricular size is moderately enlarged. There is moderately elevated pulmonary artery systolic pressure. The estimated right ventricular systolic pressure is 40.0 mmHg.  3. Left atrial size was moderately dilated.  4. Right atrial size was moderately dilated.  5. The mitral valve is degenerative. Moderate to severe mitral valve regurgitation. No evidence of mitral stenosis.  6. The aortic valve is tricuspid and severely calcified with restricted movement in systole. The gradients are quite low given the severely reduced LV function (LVOT VTI 8 cm, SV=34 cc, SVi=18 cc/m2). AVA by VTI is 1.44 cm2 and DI is 0.34, consistent with moderate aortic stenosis. If there are concerns for severe aortic stenosis, would recommend an aortic valve calcium score for clarification. The aortic valve is tricuspid. Aortic valve regurgitation is not visualized. Moderate to severe aortic valve  stenosis. Aortic valve area, by VTI measures 1.44 cm. Aortic valve mean gradient measures 5.0 mmHg. Aortic valve Vmax measures 1.50 m/s. FINDINGS  Left Ventricle: Severely reduced LV function, 15-20%. LVOT VTI 8 cm. Left ventricular ejection fraction, by estimation, is 15-20%. The left ventricle has severely decreased function. The left ventricle demonstrates global hypokinesis. The left ventricular internal cavity size was normal in size. There is no left ventricular hypertrophy. Left ventricular diastolic function could not be evaluated due to atrial fibrillation. Left ventricular diastolic function could not be evaluated. Right Ventricle: The right ventricular size is moderately enlarged. No increase in right ventricular wall thickness. Right ventricular systolic function is moderately reduced. There is moderately elevated pulmonary artery systolic pressure. The  tricuspid  regurgitant velocity is 3.16 m/s, and with an assumed right atrial pressure of 10 mmHg, the estimated right ventricular systolic pressure is 86.7 mmHg. Left Atrium: Left atrial size was moderately dilated. Right Atrium: Right atrial size was moderately dilated. Pericardium: Trivial pericardial effusion is present. Mitral Valve: The mitral valve is degenerative in appearance. Mild mitral annular calcification. Moderate to severe mitral valve regurgitation. No evidence of mitral valve stenosis. Tricuspid Valve: The tricuspid valve is grossly normal. Tricuspid valve regurgitation is mild . No evidence of tricuspid stenosis. Aortic Valve: The aortic valve is tricuspid and severely calcified with restricted movement in systole. The gradients are quite low given the severely reduced LV function (LVOT VTI 8 cm, SV=34 cc, SVi=18 cc/m2). AVA by VTI is 1.44 cm2 and DI is 0.34, consistent with moderate aortic stenosis. If there are concerns for severe aortic stenosis, would recommend an aortic valve calcium score for clarification. The aortic valve is tricuspid. Aortic valve regurgitation is not visualized. Moderate to severe aortic stenosis is present. Aortic valve mean gradient measures 5.0 mmHg. Aortic valve peak gradient measures 9.0 mmHg. Aortic valve area, by VTI measures 1.44 cm. Pulmonic Valve: The pulmonic valve was grossly normal. Pulmonic valve regurgitation is not visualized. No evidence of pulmonic stenosis. Aorta: The aortic root is normal in size and structure. IAS/Shunts: The atrial septum is grossly normal. Additional Comments: A pacer wire is visualized in the right atrium and right ventricle. There is a small pleural effusion in the left lateral region.  LEFT VENTRICLE PLAX 2D LVIDd:         5.20 cm LVIDs:         4.10 cm LV PW:         1.10 cm LV IVS:        0.90 cm  LVOT diam:     2.30 cm LV SV:         34 LV SV Index:   18 LVOT Area:     4.15 cm  LEFT ATRIUM         Index LA diam:    4.60 cm  2.46 cm/m  AORTIC VALVE AV Area (Vmax):    1.27 cm AV Area (Vmean):   1.29 cm AV Area (VTI):     1.44 cm AV Vmax:           149.87 cm/s AV Vmean:          104.497 cm/s AV VTI:            0.238 m AV Peak Grad:      9.0 mmHg AV Mean Grad:      5.0 mmHg LVOT Vmax:         45.70 cm/s LVOT Vmean:        32.500 cm/s LVOT VTI:          0.082 m LVOT/AV VTI ratio: 0.35  AORTA Ao Root diam: 3.20 cm MITRAL VALVE               TRICUSPID VALVE MV Area (PHT): 2.11 cm    TR Peak grad:   39.9 mmHg MV Decel Time: 359 msec    TR Vmax:        316.00 cm/s MR Peak grad: 84.8 mmHg MR Mean grad: 50.0 mmHg    SHUNTS MR Vmax:      460.50 cm/s  Systemic VTI:  0.08 m MR Vmean:     329.0 cm/s   Systemic Diam: 2.30 cm MV E velocity: 56.10 cm/s Eleonore Chiquito MD Electronically signed by Eleonore Chiquito MD Signature Date/Time: 07/11/2019/4:04:21 PM    Final    ECHO TEE  Result Date: 07/24/2019    TRANSESOPHOGEAL ECHO REPORT   Patient Name:   [name] [name] Date of Exam: 07/24/2019 Medical Rec #:  [id number]         Height:       70.0 in Accession #:    [id number]        Weight:       161.6 lb Date of Birth:  12-13-1924          BSA:          1.906 m Patient Age:    70 years          BP:           109/57 mmHg Patient Gender: M                 HR:           75 bpm. Exam Location:  Inpatient Procedure: Transesophageal Echo, Cardiac Doppler and Color Doppler Indications:    atrial fibrillation  History:        Patient has prior history of Echocardiogram examinations. CHF;                 Mitral Valve Disease.  Sonographer:    Clayton Lefort RDCS (AE) Referring Phys: [id number] ANGELA NICOLE DUKE PROCEDURE: The transesophogeal probe was passed without difficulty through the esophogus of the patient. Sedation performed by different physician. Image quality was good. The patient developed no complications during the procedure. The patient also received 6 mg of iv Etomidate for conscious sedation. IMPRESSIONS  1. No thrombus was seen in the left atrial  appendage or left atrium.  2. Left ventricular ejection  fraction, by estimation, is <20%. The left ventricle has severely decreased function. The left ventricle demonstrates global hypokinesis. The left ventricular internal cavity size was moderately dilated. Left ventricular diastolic function could not be evaluated.  3. Right ventricular systolic function is moderately reduced. The right ventricular size is moderately enlarged. There is moderately elevated pulmonary artery systolic pressure. The estimated right ventricular systolic pressure is 26.8 mmHg.  4. Low LAA filling and emptying velocities. Left atrial size was severely dilated. No left atrial/left atrial appendage thrombus was detected.  5. Right atrial size was moderately dilated.  6. The mitral valve is degenerative. Moderate to severe mitral valve regurgitation. No evidence of mitral stenosis.  7. The tricuspid valve is degenerative. Tricuspid valve regurgitation is moderate.  8. The aortic valve is tricuspid. Aortic valve regurgitation is trivial. Moderate aortic valve stenosis.  9. There is Moderate (Grade III) atheroma plaque involving the descending aorta. 10. Evidence of atrial level shunting detected by color flow Doppler. There is a small patent foramen ovale with predominantly left to right shunting across the atrial septum. 11. The patient also received 6 mg of iv Etomidate for conscious sedation. Conclusion(s)/Recommendation(s): No LA/LAA thrombus identified. Successful cardioversion performed with restoration of normal sinus rhythm. FINDINGS  Left Ventricle: Left ventricular ejection fraction, by estimation, is <20%. The left ventricle has severely decreased function. The left ventricle demonstrates global hypokinesis. The left ventricular internal cavity size was moderately dilated. There is no left ventricular hypertrophy. Left ventricular diastolic function could not be evaluated. Right Ventricle: The right ventricular size is moderately  enlarged. No increase in right ventricular wall thickness. Right ventricular systolic function is moderately reduced. There is moderately elevated pulmonary artery systolic pressure. The tricuspid  regurgitant velocity is 2.84 m/s, and with an assumed right atrial pressure of 8 mmHg, the estimated right ventricular systolic pressure is 34.1 mmHg. Left Atrium: Low LAA filling and emptying velocities. Left atrial size was severely dilated. No left atrial/left atrial appendage thrombus was detected. Right Atrium: Right atrial size was moderately dilated. Pericardium: Trivial pericardial effusion is present. Mitral Valve: The mitral valve is degenerative in appearance. Moderate to severe mitral valve regurgitation. No evidence of mitral valve stenosis. Tricuspid Valve: The tricuspid valve is degenerative in appearance. Tricuspid valve regurgitation is moderate. Aortic Valve: The aortic valve is tricuspid. . There is severe thickening and severe calcifcation of the aortic valve. Aortic valve regurgitation is trivial. Moderate aortic stenosis is present. There is severe thickening of the aortic valve. There is severe calcifcation of the aortic valve. Pulmonic Valve: The pulmonic valve was normal in structure. Pulmonic valve regurgitation is trivial. Aorta: The ascending aorta was not well visualized. There is moderate (Grade III) atheroma plaque involving the descending aorta. IAS/Shunts: Evidence of atrial level shunting detected by color flow Doppler. A small patent foramen ovale is detected with predominantly left to right shunting across the atrial septum. Additional Comments: A pacer wire is visualized.  TRICUSPID VALVE TR Peak grad:   32.3 mmHg TR Vmax:        284.00 cm/s Ena Dawley MD Electronically signed by Ena Dawley MD Signature Date/Time: 07/24/2019/12:30:43 PM    Final    VAS [place] LOWER EXTREMITY VENOUS (DVT)  Result Date: 07/12/2019  Lower Venous DVTStudy Indications: Swelling, and Edema.   Comparison Study: NO PRIOR Performing Technologist: Abram Sander RVS  Examination Guidelines: A complete evaluation includes B-mode imaging, spectral Doppler, color Doppler, and power Doppler as needed of all accessible portions of each vessel. Bilateral testing  is considered an integral part of a complete examination. Limited examinations for reoccurring indications may be performed as noted. The reflux portion of the exam is performed with the patient in reverse Trendelenburg.  +---------+---------------+---------+-----------+----------+--------------+ RIGHT    CompressibilityPhasicitySpontaneityPropertiesThrombus Aging +---------+---------------+---------+-----------+----------+--------------+ CFV      Full           Yes      Yes                                 +---------+---------------+---------+-----------+----------+--------------+ SFJ      Full                                                        +---------+---------------+---------+-----------+----------+--------------+ FV Prox  Full                                                        +---------+---------------+---------+-----------+----------+--------------+ FV Mid   Full                                                        +---------+---------------+---------+-----------+----------+--------------+ FV DistalFull                                                        +---------+---------------+---------+-----------+----------+--------------+ PFV      Full                                                        +---------+---------------+---------+-----------+----------+--------------+ POP      Full           Yes      Yes                                 +---------+---------------+---------+-----------+----------+--------------+ PTV      Full                                                        +---------+---------------+---------+-----------+----------+--------------+ PERO                                                   Not visualized +---------+---------------+---------+-----------+----------+--------------+   +---------+---------------+---------+-----------+----------+--------------+ LEFT     CompressibilityPhasicitySpontaneityPropertiesThrombus Aging +---------+---------------+---------+-----------+----------+--------------+ CFV      Full  Yes      Yes                                 +---------+---------------+---------+-----------+----------+--------------+ SFJ      Full                                                        +---------+---------------+---------+-----------+----------+--------------+ FV Prox  Full                                                        +---------+---------------+---------+-----------+----------+--------------+ FV Mid   Full                                                        +---------+---------------+---------+-----------+----------+--------------+ FV DistalFull                                                        +---------+---------------+---------+-----------+----------+--------------+ PFV      Full                                                        +---------+---------------+---------+-----------+----------+--------------+ POP      Full           Yes      Yes                                 +---------+---------------+---------+-----------+----------+--------------+ PTV      Full                                                        +---------+---------------+---------+-----------+----------+--------------+ PERO                                                  Not visualized +---------+---------------+---------+-----------+----------+--------------+     Summary: BILATERAL: - No evidence of deep vein thrombosis seen in the lower extremities, bilaterally. -   *See table(s) above for measurements and observations. Electronically signed by Servando Snare MD on 07/12/2019  at 3:37:31 PM.    Final      The results of significant diagnostics from this hospitalization (including imaging, microbiology, ancillary and laboratory) are listed below for reference.     Microbiology: Recent Results (from the past 240 hour(s))  SARS Coronavirus 2 by RT PCR (hospital order, performed in [facility] hospital lab) Nasopharyngeal Nasopharyngeal Swab     Status: None   Collection Time: 07/24/19  3:04 PM   Specimen: Nasopharyngeal Swab  Result Value Ref Range Status   SARS Coronavirus 2 NEGATIVE NEGATIVE Final    Comment: (NOTE) SARS-CoV-2 target nucleic acids are NOT DETECTED. The SARS-CoV-2 RNA is generally detectable in upper and lower respiratory specimens during the acute phase of infection. The lowest concentration of SARS-CoV-2 viral copies this assay can detect is 250 copies / mL. A negative result does not preclude SARS-CoV-2 infection and should not be used as the sole basis for treatment or other patient management decisions.  A negative result may occur with improper specimen collection / handling, submission of specimen other than nasopharyngeal swab, presence of viral mutation(s) within the areas targeted by this assay, and inadequate number of viral copies (<250 copies / mL). A negative result must be combined with clinical observations, patient history, and epidemiological information. Fact Sheet for Patients:   StrictlyIdeas.no Fact Sheet for Healthcare Providers: BankingDealers.co.za This test is not yet approved or cleared  by the [place] FDA and has been authorized for detection and/or diagnosis of SARS-CoV-2 by FDA under an Emergency Use Authorization (EUA).  This EUA will remain in effect (meaning this test can be used) for the duration of the COVID-19 declaration under Section 564(b)(1) of the Act, 21 U.S.C. section 360bbb-3(b)(1), unless the authorization is terminated or revoked  sooner. Performed at [facility], [place] [address] [place], [place] [address]      Labs: BNP (last 3 results) Recent Labs    07/10/19 1620  BNP 1,062.6*   Basic Metabolic Panel: Recent Labs  Lab 07/22/19 0410 07/23/19 0322 07/24/19 0350 07/24/19 1540 07/25/19 0321  NA 136 136 139 139 140  K 3.7 4.5 4.9 4.5 4.3  CL 97* 96* 100 102 102  CO2 29 32 29 31 30   GLUCOSE 101* 105* 87 135* 87  BUN 47* 50* 47* 44* 42*  CREATININE 1.07 1.42* 1.39* 1.23 1.35*  CALCIUM 7.9* 8.3* 8.4* 8.0* 7.9*  MG 2.0 2.1 2.1  --  2.0   Liver Function Tests: No results for input(s): AST, ALT, ALKPHOS, BILITOT, PROT, ALBUMIN in the last 168 hours. No results for input(s): LIPASE, AMYLASE in the last 168 hours. No results for input(s): AMMONIA in the last 168 hours. CBC: Recent Labs  Lab 07/19/19 1411 07/19/19 1411 07/20/19 0405 07/22/19 0410 07/23/19 0322 07/24/19 0350 07/25/19 0321  WBC 8.5   < > 7.3 6.4 7.8 7.5 6.6  NEUTROABS 7.6  --   --   --   --   --   --   HGB 12.6*   < > 12.4* 10.9* 11.3* 11.1* 10.4*  HCT 40.2   < > 40.3 34.8* 36.4* 36.6* 33.9*  MCV 108.1*   < > 110.7* 107.4* 107.7* 111.2* 109.0*  PLT 142*   < > 122* 128* 149* 158 147*   < > = values in this interval not displayed.   Cardiac Enzymes: No results for input(s): CKTOTAL, CKMB, CKMBINDEX, TROPONINI in the last 168 hours. BNP: Invalid input(s): POCBNP CBG: No results for input(s): GLUCAP in the last 168 hours. D-Dimer No results for input(s): DDIMER in the last 72 hours. Hgb A1c No results for input(s): HGBA1C in the last 72 hours. Lipid Profile No results for input(s): CHOL, HDL, LDLCALC, TRIG, CHOLHDL, LDLDIRECT in the last 72 hours. Thyroid  function studies No results for input(s): TSH, T4TOTAL, T3FREE, THYROIDAB in the last 72 hours.  Invalid input(s): FREET3 Anemia work up No results for input(s): VITAMINB12, FOLATE, FERRITIN, TIBC, IRON, RETICCTPCT in the last 72 hours. Urinalysis     Component Value Date/Time   COLORURINE AMBER (A) 07/10/2019 1635   APPEARANCEUR CLEAR 07/10/2019 1635   LABSPEC 1.027 07/10/2019 1635   PHURINE 5.0 07/10/2019 1635   GLUCOSEU NEGATIVE 07/10/2019 1635   HGBUR NEGATIVE 07/10/2019 1635   BILIRUBINUR NEGATIVE 07/10/2019 1635   KETONESUR NEGATIVE 07/10/2019 1635   PROTEINUR 100 (A) 07/10/2019 1635   UROBILINOGEN 1.0 08/06/2009 1001   NITRITE NEGATIVE 07/10/2019 1635   LEUKOCYTESUR NEGATIVE 07/10/2019 1635   Sepsis Labs Invalid input(s): PROCALCITONIN,  WBC,  LACTICIDVEN Microbiology Recent Results (from the past 240 hour(s))  SARS Coronavirus 2 by RT PCR (hospital order, performed in [place] hospital lab) Nasopharyngeal Nasopharyngeal Swab     Status: None   Collection Time: 07/24/19  3:04 PM   Specimen: Nasopharyngeal Swab  Result Value Ref Range Status   SARS Coronavirus 2 NEGATIVE NEGATIVE Final    Comment: (NOTE) SARS-CoV-2 target nucleic acids are NOT DETECTED. The SARS-CoV-2 RNA is generally detectable in upper and lower respiratory specimens during the acute phase of infection. The lowest concentration of SARS-CoV-2 viral copies this assay can detect is 250 copies / mL. A negative result does not preclude SARS-CoV-2 infection and should not be used as the sole basis for treatment or other patient management decisions.  A negative result may occur with improper specimen collection / handling, submission of specimen other than nasopharyngeal swab, presence of viral mutation(s) within the areas targeted by this assay, and inadequate number of viral copies (<250 copies / mL). A negative result must be combined with clinical observations, patient history, and epidemiological information. Fact Sheet for Patients:   StrictlyIdeas.no Fact Sheet for Healthcare Providers: BankingDealers.co.za This test is not yet approved or cleared  by the [place] FDA and has been authorized  for detection and/or diagnosis of SARS-CoV-2 by FDA under an Emergency Use Authorization (EUA).  This EUA will remain in effect (meaning this test can be used) for the duration of the COVID-19 declaration under Section 564(b)(1) of the Act, 21 U.S.C. section 360bbb-3(b)(1), unless the authorization is terminated or revoked sooner. Performed at [facility], [place] [address], [place], [place] [address]      Time coordinating discharge:  I have spent 35 minutes face to face with the patient and on the ward discussing the patients care, assessment, plan and disposition with other care givers. >50% of the time was devoted counseling the patient about the risks and benefits of treatment/Discharge disposition and coordinating care.   SIGNED:   Damita Lack, MD  [facility] 07/25/2019, 2:02 PM   If 7PM-7AM, please contact night-coverage

## 2019-07-25 NOTE — Progress Notes (Signed)
Report given to Pett[facility] at [facility]. Answered questions.She will follow up with pts son to see about covid vaccine. Per pt the card is in his wallet.. Discussed skin compromise at length with receiving facility. Continue with plan of care. No changes in initial am assessment at this time. [facility] called for transport. Out of facility DNR on chart. Cont with plan of care until d/c

## 2019-07-25 NOTE — Progress Notes (Signed)
Discussion of changes need to d/c summary with CM. Correction complete and she is sending information. Attempt to call report to [phone]. No answer. Will try again

## 2019-07-25 NOTE — Progress Notes (Signed)
Agree with SN assessment. Noted edema to right hip. Multiple skin tears. Skin is very thin.

## 2019-07-25 NOTE — Discharge Instructions (Addendum)
Reason for Consult: weeping and edema Patient with self reported history of venous stasis and graft placed 2 years ago to the LLE medial malleolar region.  Reports has compression stockings at home but having difficulty with donning recently. Hemosiderin staining noted R>L.  No open wounds currently. No active weeping Wound type: none Elevate legs as much as possible. Reinitiate use of compression stockings at DC.  Appears patient may need SNF placement; they can assist with donning and doffing daily.  Pressure Injury  A pressure injury is damage to the skin and underlying tissue that results from pressure being applied to an area of the body. It often affects people who must spend a long time in a bed or chair because of a medical condition. Pressure injuries usually occur:  Over bony parts of the body, such as the tailbone, shoulders, elbows, hips, heels, spine, ankles, and back of the head.  Under medical devices that make contact with the body, such as respiratory equipment, stockings, tubes, and splints. Pressure injuries start as reddened areas on the skin and can lead to pain and an open wound. What are the causes? This condition is caused by frequent or constant pressure to an area of the body. Decreased blood flow to the skin can eventually cause the skin tissue to die and break down, causing a wound. What increases the risk? You are more likely to develop this condition if you:  Are in the hospital or an extended care facility.  Are bedridden or in a wheelchair.  Have an injury or disease that keeps you from: ? Moving normally. ? Feeling pain or pressure.  Have a condition that: ? Makes you sleepy or less alert. ? Causes poor blood flow.  Need to wear a medical device.  Have poor control of your bladder or bowel functions (incontinence).  Have poor nutrition (malnutrition). If you are at risk for pressure injuries, your health care provider may recommend certain types of  mattresses, mattress covers, pillows, cushions, or boots to help prevent them. These may include products filled with air, foam, gel, or sand. What are the signs or symptoms? Symptoms of this condition depend on the severity of the injury. Symptoms may include:  Red or dark areas of the skin.  Pain, warmth, or a change of skin texture.  Blisters.  An open wound. How is this diagnosed? This condition is diagnosed with a medical history and physical exam. You may also have tests, such as:  Blood tests.  Imaging tests.  Blood flow tests. Your pressure injury will be staged based on its severity. Staging is based on:  The depth of the tissue injury, including whether there is exposure of muscle, bone, or tendon.  The cause of the pressure injury. How is this treated? This condition may be treated by:  Relieving or redistributing pressure on your skin. This includes: ? Frequently changing your position. ? Avoiding positions that caused the wound or that can make the wound worse. ? Using specific bed mattresses, chair cushions, or protective boots. ? Moving medical devices from an area of pressure, or placing padding between the skin and the device. ? Using foams, creams, or powders to prevent rubbing (friction) on the skin.  Keeping your skin clean and dry. This may include using a skin cleanser or skin barrier as told by your health care provider.  Cleaning your injury and removing any dead tissue from the wound (debridement).  Placing a bandage (dressing) over your injury.  Using medicines  for pain or to prevent or treat infection. Surgery may be needed if other treatments are not working or if your injury is very deep. Follow these instructions at home: Wound care  Follow instructions from your health care provider about how to take care of your wound. Make sure you: ? Wash your hands with soap and water before and after you change your bandage (dressing). If soap and  water are not available, use hand sanitizer. ? Change your dressing as told by your health care provider.  Check your wound every day for signs of infection. Have a caregiver do this for you if you are not able. Check for: ? Redness, swelling, or increased pain. ? More fluid or blood. ? Warmth. ? Pus or a bad smell. Skin care  Keep your skin clean and dry. Gently pat your skin dry.  Do not rub or massage your skin.  You or a caregiver should check your skin every day for any changes in color or any new blisters or sores (ulcers). Medicines  Take over-the-counter and prescription medicines only as told by your health care provider.  If you were prescribed an antibiotic medicine, take or apply it as told by your health care provider. Do not stop using the antibiotic even if your condition improves. Reducing and redistributing pressure  Do not lie or sit in one position for a long time. Move or change position every 1-2 hours, or as told by your health care provider.  Use pillows or cushions to reduce pressure. Ask your health care provider to recommend cushions or pads for you. General instructions   Eat a healthy diet that includes lots of protein.  Drink enough fluid to keep your urine pale yellow.  Be as active as you can every day. Ask your health care provider to suggest safe exercises or activities.  Do not abuse drugs or alcohol.  Do not use any products that contain nicotine or tobacco, such as cigarettes, e-cigarettes, and chewing tobacco. If you need help quitting, ask your health care provider.  Keep all follow-up visits as told by your health care provider. This is important. Contact a health care provider if:  You have: ? A fever or chills. ? Pain that is not helped by medicine. ? Any changes in skin color. ? New blisters or sores. ? Pus or a bad smell coming from your wound. ? Redness, swelling, or pain around your wound. ? More fluid or blood coming from  your wound.  Your wound does not improve after 1-2 weeks of treatment. Summary  A pressure injury is damage to the skin and underlying tissue that results from pressure being applied to an area of the body.  Do not lie or sit in one position for a long time. Your health care provider may advise you to move or change position every 1-2 hours.  Follow instructions from your health care provider about how to take care of your wound.  Keep all follow-up visits as told by your health care provider. This is important. This information is not intended to replace advice given to you by your health care provider. Make sure you discuss any questions you have with your health care provider. Document Revised: 08/31/2017 Document Reviewed: 08/31/2017 Elsevier Patient Education  Power.

## 2019-07-26 ENCOUNTER — Non-Acute Institutional Stay (SKILLED_NURSING_FACILITY): Payer: Medicare Other | Admitting: Internal Medicine

## 2019-07-26 ENCOUNTER — Encounter: Payer: Self-pay | Admitting: Internal Medicine

## 2019-07-26 DIAGNOSIS — I509 Heart failure, unspecified: Secondary | ICD-10-CM

## 2019-07-26 DIAGNOSIS — I4891 Unspecified atrial fibrillation: Secondary | ICD-10-CM | POA: Diagnosis not present

## 2019-07-26 DIAGNOSIS — N179 Acute kidney failure, unspecified: Secondary | ICD-10-CM | POA: Diagnosis not present

## 2019-07-26 DIAGNOSIS — R531 Weakness: Secondary | ICD-10-CM

## 2019-07-26 DIAGNOSIS — J432 Centrilobular emphysema: Secondary | ICD-10-CM | POA: Diagnosis not present

## 2019-07-26 DIAGNOSIS — D509 Iron deficiency anemia, unspecified: Secondary | ICD-10-CM

## 2019-07-26 NOTE — Progress Notes (Signed)
Location:    [facility] [place] [facility] Room Number: 505/P Place of Service:  SNF [phone]) Provider:  Hanley Ben, MD  Patient Care Team: [name] Cruel, MD as PCP - General ([place]) [name] Booze, MD as PCP - Cardiology (Cardiology) [name] Sprang, MD as PCP - Electrophysiology (Cardiology) [name] Portela, MD (Hematology and Oncology) [name] Rhodes, MD as Attending Physician (Urology)  Extended Emergency Contact Information Primary Emergency Contact: [name] [name] Address: [name] [address] [name] Phone: [phone] Mobile Phone: [phone] Relation: Son Secondary Emergency Contact: [name] [name], [place] Mobile Phone: [phone] Relation: Son  Code Status:  DNR Goals of care: Advanced Directive information Advanced Directives 07/26/2019  Does Patient Have a Medical Advance Directive? Yes  Type of Advance Directive Out of facility DNR (pink MOST or yellow form)  Does patient want to make changes to medical advance directive? No - Patient declined  Copy of [place] in Chart? -  Would patient like information on creating a medical advance directive? -  Pre-existing out of facility DNR order (yellow form or pink MOST form) -     Chief Complaint  Patient presents with  . Hospitalization Follow-up    Hospitalization Follow Up   Status post hospitalization for CHF exacerbation complicated with atrial fibrillation with rapid ventricular rate.   HPI:  Pt is a 86 y.o. male seen today for a hospital f/u after admission for CHF exacerbation with rapid ventricular rate A. fib.  Patient has a history of atrial fibrillation with a pacemaker as well as chronic kidney disease stage III COPD as well.  He presented to the hospital with weakness shortness of breath and increasing edema.  He was found to have pulmonary vascular congestion and rapid  ventricular rate atrial fibrillation.  He was started on Lasix as well as Cardizem.  Pacemaker showed 78% burden of A. fib.  Echo showed an ejection fraction of 15-20% with global hypokinesis.  Amiodarone and beta-blocker were stopped temporarily-his Eliquis was reduced to 2.5 mg.  Hospice was considered but this was rescinded and there is desire for cardiology evaluation which will.  He pursued.  He did undergo a TEE cardio conversion on June 8 which was well-tolerated and succeeded.  Currently he is lying in bed he is bright alert appears quite frail and weak but appears to be doing well considering his comorbidities  \He is not complaining of any shortness of breath at this point or chest pain-heart rate appears to be controlled oxygen saturation is 94% on room air  Past Medical History:  Diagnosis Date  . Abdominal hernia   . Abnormality of gait    due to low back pain , uses  cane and walker  . Anemia of chronic disease    [name] [name]  . Arthritis   . Bilateral lower extremity edema    wears TED hose  . Cancer [facility])    prostate- treated with radiation  . Cardiac pacemaker in situ 10/11/2006   medtronic  . Chronic iron deficiency anemia oncologist/ hematoloist-- dr Alen [name]   treatment IV Iron infusions  . Chronic low back pain   . CKD (chronic kidney disease), stage III   . COPD with emphysema [facility])    pulmologist-  dr Elsworth [name]  . DDD (degenerative disc disease), lumbosacral   . Degenerative scoliosis   . Dyspnea    with  exertion  . Full dentures   . GERD (gastroesophageal reflux disease)   . Gross hematuria   . Headache    history of migraines- "way in the past"  . Hereditary and idiopathic peripheral neuropathy    bilateral lower leg  . Hiatal hernia   . History of DVT of lower extremity 10/19/2011   chronic distal popliteal right lower extremity  . History of external beam radiation therapy 2008   prostate cancer  . History of pancreatitis 2013  . History of  pneumothorax 09/2006   iatrogenic-- in setting cardiac pacemaker placement  . History of prostate cancer 2008   s/p  external radiation therapy  . Hypertension   . Injury of left ulnar nerve 06/2015   [name] [name]   . Left carpal tunnel syndrome   . Lesion of bladder   . Macular degeneration of both eyes   . Macular degeneration of right eye   . Mild mitral regurgitation   . Mild mitral regurgitation    and trace AI 07/22/08 echo and 10/2010  . Mild pulmonary hypertension ([place])   . Nocturia   . OSA on CPAP   . PAF (paroxysmal atrial fibrillation) ([place]) 10/2011   1st episode documented 09/ 2013 admission for gallstons/ pancreatitis and prior to cholecystectomy , went back in NSR without interventeion  . Pancreatitis 10/2011   gallstone induced  . Presence of permanent cardiac pacemaker   . Prostate cancer ([place])    [name] [name]  . Sinus node dysfunction (HCC)    s/p  cardiac pacemaker placement 10-11-2006  . Sinus node dysfunction (HCC)    pacemaker  . Spinal stenosis    L2  . Venous insufficiency   . Wears glasses   . Wears hearing aid in both ears    Past Surgical History:  Procedure Laterality Date  . BALLOON DILATION N/A 01/14/2015   Procedure: BALLOON DILATION;  Surgeon: Garlan Fair, MD;  Location: [facility];  Service: Endoscopy;  Laterality: N/A;  . BALLOON DILATION N/A 10/12/2017   Procedure: BALLOON DILATION;  Surgeon: Otis Brace, MD;  Location: [place] ENDOSCOPY;  Service: Gastroenterology;  Laterality: N/A;  . BIOPSY  10/12/2017   Procedure: BIOPSY;  Surgeon: Otis Brace, MD;  Location: [place] ENDOSCOPY;  Service: Gastroenterology;;  . CARDIAC CATHETERIZATION  08-29-2000   [place]   no significant obstructive CAD, intact globle LV size and systolic function with regional wall motion abnormalities noted,  ?coronary spasm producing wall motion abnormality, elevated CPK and chest pain (ef 55%)  . CARDIAC PACEMAKER PLACEMENT  10-11-2006    dr Leonia Reeves   W/  ATRIAL  LEAD REVISION 10-12-2006   (medtronic)  . CARDIOVASCULAR STRESS TEST  11-18-2011    dr Irish Lack   Low risk nuclear study w/ no ishemia/  normal wall motion,  post-stress ef 48% (LVEF 56% on prior study 2008)  . CARDIOVERSION N/A 04/02/2019   Procedure: CARDIOVERSION;  Surgeon: Josue Hector, MD;  Location: [facility] ENDOSCOPY;  Service: Cardiovascular;  Laterality: N/A;  . CARDIOVERSION N/A 07/24/2019   Procedure: CARDIOVERSION;  Surgeon: Dorothy Spark, MD;  Location: [facility]OSCOPY;  Service: Cardiovascular;  Laterality: N/A;  . CARPAL TUNNEL RELEASE Left 11/14/2014   Procedure: CARPAL TUNNEL RELEASE LEFT THUMB;  Surgeon: Daryll Brod, MD;  Location: [place];  Service: Orthopedics;  Laterality: Left;  ANESTHESIA:  IV REGIONAL FAB  . CATARACT EXTRACTION W/ INTRAOCULAR LENS  IMPLANT, BILATERAL    . CHOLECYSTECTOMY  10/23/2011  . CHOLECYSTECTOMY  10/23/2011  Procedure: CHOLECYSTECTOMY;  Surgeon: Rolm Bookbinder, MD;  Location: [place];  Service: General;  Laterality: N/A;  with intraoperative cholangiogram  . COLONOSCOPY    . ESOPHAGOGASTRODUODENOSCOPY (EGD) WITH PROPOFOL N/A 01/14/2015   Procedure: ESOPHAGOGASTRODUODENOSCOPY (EGD) WITH PROPOFOL;  Surgeon: Garlan Fair, MD;  Location: [facility] ENDOSCOPY;  Service: Endoscopy;  Laterality: N/A;  . ESOPHAGOGASTRODUODENOSCOPY (EGD) WITH PROPOFOL N/A 10/12/2017   Procedure: ESOPHAGOGASTRODUODENOSCOPY (EGD) WITH PROPOFOL;  Surgeon: Otis Brace, MD;  Location: [place];  Service: Gastroenterology;  Laterality: N/A;  . I & D EXTREMITY Left 10/20/2012   Procedure: LEFT LEG IRRIGATION AND DEBRIDEMENT, AND WOUND VAC APPLICATION;  Surgeon: Marianna Payment, MD;  Location: [facility];  Service: Orthopedics;  Laterality: Left;  . I & D EXTREMITY Left 10/22/2012   Procedure: IRRIGATION AND DEBRIDEMENT EXTREMITY;  Surgeon: Marianna Payment, MD;  Location: [facility];  Service: Orthopedics;  Laterality: Left;  . INGUINAL HERNIA REPAIR Left yrs ago  .  LAPAROSCOPIC ASSISTED VENTRAL HERNIA REPAIR  08-08-2009   dr Excell Seltzer   AND OPEN REPAIR RECURRENT LEFT INGUINAL HERNIA  . LAPAROSCOPIC INGUINAL HERNIA REPAIR Left 07-24-1999    dr Excell Seltzer  . OPEN VENTRAL HERNIA REPAIR /  LYSIS ADHESIONS  09-23-2010    dr Donne Hazel  . ORIF ELBOW FRACTURE Left 03/29/2015   Procedure: LEFT ELBOW OPEN REDUCTION INTERNAL FIXATION (ORIF) DISTAL HUMERUS FRACTURE WITH OLECRANON OSTEOTOMY AND ULNAR NERVE RELEASE;  Surgeon: Roseanne Kaufman, MD;  Location: [place];  Service: Orthopedics;  Laterality: Left;  . PPM GENERATOR CHANGEOUT N/A 02/10/2018   Procedure: PPM GENERATOR CHANGEOUT;  Surgeon: [name] Sprang, MD;  Location: [place] CV LAB;  Service: Cardiovascular;  Laterality: N/A;  . REPAIR SUPRAUMBILICAL HERNIA   [phone]   dr Excell Seltzer  . SHOULDER ARTHROSCOPY WITH DISTAL CLAVICLE RESECTION Right 11-07-2007   dr Rhona Raider   [place] ACROMIOPLASTY  . SKIN SPLIT GRAFT Left 10/22/2012   Procedure: SKIN GRAFT SPLIT THICKNESS;  Surgeon: Marianna Payment, MD;  Location: [facility];  Service: Orthopedics;  Laterality: Left;  . TEE WITHOUT CARDIOVERSION N/A 07/24/2019   Procedure: TRANSESOPHAGEAL ECHOCARDIOGRAM (TEE);  Surgeon: Dorothy Spark, MD;  Location: [facility] ENDOSCOPY;  Service: Cardiovascular;  Laterality: N/A;  . TOTAL KNEE ARTHROPLASTY Right 1990's  . TRANSTHORACIC ECHOCARDIOGRAM  06-12-2012   dr Irish Lack   ef 50-55%/ mild LAE/ mild MR and TR/  AV sclerosis without stenosis/  RVSP 43mmHg  . TRANSURETHRAL RESECTION OF BLADDER TUMOR N/A 07/15/2017   Procedure: CYSTOSCOPY TRANSURETHRAL RESECTION OF BLADDER TUMOR (TURBT);  Surgeon: [name] Rhodes, MD;  Location: [facility];  Service: Urology;  Laterality: N/A;    No Known Allergies  Allergies as of 07/26/2019   No Known Allergies     Medication List       Accurate as of July 26, 2019  4:40 PM. If you have any questions, ask your nurse or doctor.        STOP taking these  medications   albuterol 108 (90 Base) MCG/ACT inhaler Commonly known as: VENTOLIN HFA Stopped by: Granville Lewis, PA-C     TAKE these medications   acetaminophen 650 MG CR tablet Commonly known as: TYLENOL Take 650 mg by mouth 2 (two) times daily.   amiodarone 200 MG tablet Commonly known as: PACERONE Take 1 tablet (200 mg total) by mouth 2 (two) times daily.   Anoro Ellipta 62.5-25 MCG/INH Aepb Generic drug: umeclidinium-vilanterol Inhale 1 puff into the lungs daily.   apixaban  2.5 MG Tabs tablet Commonly known as: Eliquis Take 1 tablet (2.5 mg total) by mouth 2 (two) times daily.   CALCIUM-D PO Take 1 tablet by mouth every evening.   diphenhydrAMINE 25 MG tablet Commonly known as: BENADRYL Take 25 mg by mouth daily as needed for allergies.   ferrous sulfate 325 (65 FE) MG tablet Take 650 mg by mouth daily with breakfast.   fluticasone 50 MCG/ACT nasal spray Commonly known as: FLONASE Place 1 spray into both nostrils daily as needed for allergies or rhinitis.   Folic Acid-Vit V7-QIO N62 2.5-25-1 MG Tabs tablet Commonly known as: FOLBEE Take 1 tablet by mouth daily.   furosemide 20 MG tablet Commonly known as: LASIX Take 20 mg by mouth daily.   losartan 25 MG tablet Commonly known as: Cozaar Take 0.5 tablets (12.5 mg total) by mouth daily.   metoprolol succinate 25 MG 24 hr tablet Commonly known as: TOPROL-XL Take 1 tablet (25 mg total) by mouth daily.   Myrbetriq 50 MG Tb24 tablet Generic drug: mirabegron ER Take 50 mg by mouth daily.   oxyCODONE 5 MG immediate release tablet Commonly known as: Oxy IR/ROXICODONE Take 1 tablet (5 mg total) by mouth every 6 (six) hours as needed for severe pain.   oxymetazoline 0.05 % nasal spray Commonly known as: AFRIN Place 1 spray into both nostrils 2 (two) times daily as needed for congestion.   pantoprazole 40 MG tablet Commonly known as: Protonix Take 1 tablet (40 mg total) by mouth daily before breakfast.     polyethylene glycol 17 g packet Commonly known as: MiraLax Take 17 g by mouth daily as needed.   predniSONE 10 MG tablet Commonly known as: DELTASONE Take 10 mg by mouth daily with breakfast.   PRESERVISION AREDS 2 PO Take 1 each by mouth in the morning and at bedtime.   psyllium 28 % packet Commonly known as: METAMUCIL SMOOTH TEXTURE Take 1 packet by mouth daily as needed. Mix with 8 oz liquid   senna-docusate 8.6-50 MG tablet Commonly known as: Senokot-S Take 1 tablet by mouth at bedtime as needed for mild constipation.   vitamin B-12 500 MCG tablet Commonly known as: CYANOCOBALAMIN Take 500 mcg by mouth. Three days a week   VITAMIN D3 PO Take 2,000 Units by mouth daily.       Review of Systems   In general he is not complaining of any fever or chills.  Skin does not complain of rashes or itching does appear to have a propensity for bruising  Oropharynx is clear mucous membranes moist.  Head ears eyes nose mouth and throat is not complain of visual changes or sore throat.  Respiratory does not complain of being short of breath or having a cough currently.  Cardiac does not complain of chest pain he appears to have mild edema.  GI is not complaining of abdominal pain nausea vomiting diarrhea constipation.  GU is not complaining of dysuria.  Musculoskeletal does have weakness but is not really complaining of pain at this time.  Neurologic positive for weakness he does not complain of dizziness or headache or feeling syncopal.  And psych appears to be in good spirits does not complain of being depressed or anxious      Immunization History  Administered Date(s) Administered  . Fluad Quad(high Dose 65+) 11/02/2018  . Influenza Split 11/02/2015  . Influenza, High Dose Seasonal PF 11/14/2016, 11/07/2017  . Influenza,inj,Quad PF,6+ Mos 10/16/2012  . Influenza-Unspecified 11/15/2013  . Pneumococcal  Conjugate-13 06/03/2014  . Pneumococcal-Unspecified  05/04/2013  . Tdap 10/05/2012  . Zoster 02/16/2011  . Zoster Recombinat (Shingrix) 04/15/2017, 06/15/2017   Pertinent  Health Maintenance Due  Topic Date Due  . INFLUENZA VACCINE  09/16/2019  . PNA vac Low Risk Adult  Completed   Fall Risk  06/27/2019 12/30/2017  Falls in the past year? 1 1  Number falls in past yr: 1 1  Injury with Fall? 1 1  Comment bruises and skin damage -  Follow up - Falls evaluation completed   Functional Status Survey:    Vitals:   07/26/19 1638  BP: 138/80  Pulse: 92  Resp: 18  Temp: (!) 97.1 F (36.2 C)  TempSrc: Oral  SpO2: 94%  Weight: 163 lb 2.2 oz (74 kg)  Height: 5\' 10"  (1.778 m)   Body mass index is 23.41 kg/m. Physical Exam   In general this is a pleasant elderly male in no distress lying comfortably in bed-he does appear to be quite frail.  His skin is warm and dry he has diffuse bruising with wrapping of his arms bilaterally.  Eyes visual acuity appears to be in tact sclera and conjunctive are clear.  Oropharynx is clear mucous membranes moist.  Chest is clear to auscultation with shallow air entry there is no labored breathing.  Heart sounds are distant from what I could auscultate sounded like regular rate and rhythm radial pulse was regular.  He has mild lower extremity edema bilaterally.  Abdomen is soft nontender with positive bowel sounds.  Musculoskeletal Limited exam since she is in bed but is able to move all extremities x4 with general frailty.  Neurologic appears grossly intact her speech is clear cannot appreciate lateralizing findings.  Psych he is alert and oriented very pleasant and appropriate.      Labs reviewed: Recent Labs    07/23/19 0322 07/23/19 0322 07/24/19 0350 07/24/19 1540 07/25/19 0321  NA 136   < > 139 139 140  K 4.5   < > 4.9 4.5 4.3  CL 96*   < > 100 102 102  CO2 32   < > 29 31 30   GLUCOSE 105*   < > 87 135* 87  BUN 50*   < > 47* 44* 42*  CREATININE 1.42*   < > 1.39* 1.23  1.35*  CALCIUM 8.3*   < > 8.4* 8.0* 7.9*  MG 2.1  --  2.1  --  2.0   < > = values in this interval not displayed.   Recent Labs    06/27/19 1221 07/10/19 1620  AST  --  38  ALT  --  30  ALKPHOS  --  81  BILITOT  --  1.9*  PROT 5.8* 5.7*  ALBUMIN  --  3.4*   Recent Labs    07/15/19 0303 07/15/19 0303 07/16/19 0234 07/16/19 0234 07/19/19 1411 07/20/19 0405 07/23/19 0322 07/24/19 0350 07/25/19 0321  WBC 10.3   < > 9.3   < > 8.5   < > 7.8 7.5 6.6  NEUTROABS 9.0*  --  8.5*  --  7.6  --   --   --   --   HGB 13.1   < > 11.9*   < > 12.6*   < > 11.3* 11.1* 10.4*  HCT 43.1   < > 39.0   < > 40.2   < > 36.4* 36.6* 33.9*  MCV 109.4*   < > 111.1*   < > 108.1*   < >  107.7* 111.2* 109.0*  PLT 136*   < > 115*   < > 142*   < > 149* 158 147*   < > = values in this interval not displayed.   Lab Results  Component Value Date   TSH 1.512 07/11/2019   No results found for: HGBA1C No results found for: CHOL, HDL, LDLCALC, LDLDIRECT, TRIG, CHOLHDL  Significant Diagnostic Results in last 30 days:  ECHO TEE  Result Date: 07/24/2019    TRANSESOPHOGEAL ECHO REPORT   Patient Name:   [name] [name] Date of Exam: 07/24/2019 Medical Rec #:  [id number]         Height:       70.0 in Accession #:    [id number]        Weight:       161.6 lb Date of Birth:  [birth year]          BSA:          1.906 m Patient Age:    86 years          BP:           109/57 mmHg Patient Gender: M                 HR:           75 bpm. Exam Location:  Inpatient Procedure: Transesophageal Echo, Cardiac Doppler and Color Doppler Indications:    atrial fibrillation  History:        Patient has prior history of Echocardiogram examinations. CHF;                 Mitral Valve Disease.  Sonographer:    Clayton Lefort RDCS (AE) Referring Phys: [id number] ANGELA NICOLE DUKE PROCEDURE: The transesophogeal probe was passed without difficulty through the esophogus of the patient. Sedation performed by different physician. Image quality was good. The  patient developed no complications during the procedure. The patient also received 6 mg of iv Etomidate for conscious sedation. IMPRESSIONS  1. No thrombus was seen in the left atrial appendage or left atrium.  2. Left ventricular ejection fraction, by estimation, is <20%. The left ventricle has severely decreased function. The left ventricle demonstrates global hypokinesis. The left ventricular internal cavity size was moderately dilated. Left ventricular diastolic function could not be evaluated.  3. Right ventricular systolic function is moderately reduced. The right ventricular size is moderately enlarged. There is moderately elevated pulmonary artery systolic pressure. The estimated right ventricular systolic pressure is 61.9 mmHg.  4. Low LAA filling and emptying velocities. Left atrial size was severely dilated. No left atrial/left atrial appendage thrombus was detected.  5. Right atrial size was moderately dilated.  6. The mitral valve is degenerative. Moderate to severe mitral valve regurgitation. No evidence of mitral stenosis.  7. The tricuspid valve is degenerative. Tricuspid valve regurgitation is moderate.  8. The aortic valve is tricuspid. Aortic valve regurgitation is trivial. Moderate aortic valve stenosis.  9. There is Moderate (Grade III) atheroma plaque involving the descending aorta. 10. Evidence of atrial level shunting detected by color flow Doppler. There is a small patent foramen ovale with predominantly left to right shunting across the atrial septum. 11. The patient also received 6 mg of iv Etomidate for conscious sedation. Conclusion(s)/Recommendation(s): No LA/LAA thrombus identified. Successful cardioversion performed with restoration of normal sinus rhythm. FINDINGS  Left Ventricle: Left ventricular ejection fraction, by estimation, is <20%. The left ventricle has severely decreased function. The left ventricle demonstrates global hypokinesis. The  left ventricular internal cavity size  was moderately dilated. There is no left ventricular hypertrophy. Left ventricular diastolic function could not be evaluated. Right Ventricle: The right ventricular size is moderately enlarged. No increase in right ventricular wall thickness. Right ventricular systolic function is moderately reduced. There is moderately elevated pulmonary artery systolic pressure. The tricuspid  regurgitant velocity is 2.84 m/s, and with an assumed right atrial pressure of 8 mmHg, the estimated right ventricular systolic pressure is 09.6 mmHg. Left Atrium: Low LAA filling and emptying velocities. Left atrial size was severely dilated. No left atrial/left atrial appendage thrombus was detected. Right Atrium: Right atrial size was moderately dilated. Pericardium: Trivial pericardial effusion is present. Mitral Valve: The mitral valve is degenerative in appearance. Moderate to severe mitral valve regurgitation. No evidence of mitral valve stenosis. Tricuspid Valve: The tricuspid valve is degenerative in appearance. Tricuspid valve regurgitation is moderate. Aortic Valve: The aortic valve is tricuspid. . There is severe thickening and severe calcifcation of the aortic valve. Aortic valve regurgitation is trivial. Moderate aortic stenosis is present. There is severe thickening of the aortic valve. There is severe calcifcation of the aortic valve. Pulmonic Valve: The pulmonic valve was normal in structure. Pulmonic valve regurgitation is trivial. Aorta: The ascending aorta was not well visualized. There is moderate (Grade III) atheroma plaque involving the descending aorta. IAS/Shunts: Evidence of atrial level shunting detected by color flow Doppler. A small patent foramen ovale is detected with predominantly left to right shunting across the atrial septum. Additional Comments: A pacer wire is visualized.  TRICUSPID VALVE TR Peak grad:   32.3 mmHg TR Vmax:        284.00 cm/s Ena Dawley MD Electronically signed by Ena Dawley MD  Signature Date/Time: 07/24/2019/12:30:43 PM    Final     Assessment/Plan  #1 history of atrial fibrillation with rapid ventricular rate-she is status post pacemaker which was interrogated in the hospital and found to have 78% burden of A. fib.  Cardiac echo showed an ejection fraction of 15-20%.  He is currently on amiodarone 200 mg twice daily and Toprol-XL 25 mg a day.  He is on Eliquis 2.5 mg twice daily for anticoagulation.  He will need a cardiology follow-up-there was consideration of hospice apparently he improved however and thus cardiology input is being pursued.  He did have a cardio conversion on June 8 which was successful.  Of note he also continues on losartan 12.5 mg a day.  2.  History of CHF with ejection fraction 15 to 20%-.  He is on Lasix 20 mg a day as well as Toprol-XL 25 mg a day and losartan 12.5 mg a day.  At this point continue to monitor clinical status and weights-she is not complaining of any chest pain or shortness of breath.  3.  History of acute kidney injury creatinine did peak at 1.5 it was 1 0.35 on discharge-he did receive some hydration in the hospital.  This will need to be rechecked on Monday, June 14.  4.  History of bilateral lower extremity edema again will monitor he does have wounds that will be followed by wound care he is on the Lasix 20 mg a day.  In the hospital he did receive some doxycycline for concerns of cellulitis which appears to have resolved but this will need monitoring.  5.  History of anemia with an element of iron deficiency he continues on iron 325 mg twice daily hemoglobin on discharge was 10.4 will update this next week  as well.  6.  History of COPD continues on prednisone 10 mg a day as well as Anoro Ellipta daily and albuterol inhaler every 6 hours as needed at this point he is not complaining of any cough or shortness of breath but will need monitoring.  7.  History of weakness debility he will need extensive PT and  OT-.  8.  History of constipation he does have orders for MiraLAX senna as well as Metamucil will monitor.  9.  History of urinary issues-he is on my Betrix 50 mg a day.  10.  History of pain he does have orders for oxycodone 5 mg every 6 hours as needed for severe pain he also has routine Tylenol 650 mg twice daily-at this point he is not really complaining of pain.  [id number] note greater than 40 minutes spent assessing patient-reviewing his chart and labs-and coordinating and formulating a plan of care for numerous diagnoses-of note greater than 50% of time spent coordinating a plan of care with input as noted above

## 2019-07-27 ENCOUNTER — Non-Acute Institutional Stay (SKILLED_NURSING_FACILITY): Payer: Medicare Other | Admitting: Internal Medicine

## 2019-07-27 ENCOUNTER — Encounter: Payer: Self-pay | Admitting: Internal Medicine

## 2019-07-27 DIAGNOSIS — I5023 Acute on chronic systolic (congestive) heart failure: Secondary | ICD-10-CM

## 2019-07-27 DIAGNOSIS — G992 Myelopathy in diseases classified elsewhere: Secondary | ICD-10-CM

## 2019-07-27 DIAGNOSIS — I4891 Unspecified atrial fibrillation: Secondary | ICD-10-CM | POA: Diagnosis not present

## 2019-07-27 DIAGNOSIS — Z66 Do not resuscitate: Secondary | ICD-10-CM

## 2019-07-27 DIAGNOSIS — R531 Weakness: Secondary | ICD-10-CM

## 2019-07-27 DIAGNOSIS — N179 Acute kidney failure, unspecified: Secondary | ICD-10-CM

## 2019-07-27 DIAGNOSIS — J432 Centrilobular emphysema: Secondary | ICD-10-CM | POA: Diagnosis not present

## 2019-07-27 DIAGNOSIS — N3281 Overactive bladder: Secondary | ICD-10-CM

## 2019-07-27 DIAGNOSIS — S3210XS Unspecified fracture of sacrum, sequela: Secondary | ICD-10-CM

## 2019-07-27 DIAGNOSIS — N1832 Chronic kidney disease, stage 3b: Secondary | ICD-10-CM

## 2019-07-27 DIAGNOSIS — M48061 Spinal stenosis, lumbar region without neurogenic claudication: Secondary | ICD-10-CM

## 2019-07-27 DIAGNOSIS — D509 Iron deficiency anemia, unspecified: Secondary | ICD-10-CM

## 2019-07-27 NOTE — Progress Notes (Signed)
Provider:  [name]. Mariea Clonts, D.O., C.M.D. Location:  [place] Room Number: [place]:  SNF (31)  PCP: [name] Cruel, MD Patient Care Team: [name] Cruel, MD as PCP - General ([place]) [name] Booze, MD as PCP - Cardiology (Cardiology) [name] Sprang, MD as PCP - Electrophysiology (Cardiology) [name] Portela, MD (Hematology and Oncology) [name] Rhodes, MD as Attending Physician (Urology)  Extended Emergency Contact Information Primary Emergency Contact: [name] [name] Address: [place], [place] [address] [name] [name] of [place] Phone: [phone] Mobile Phone: [phone] Relation: Son Secondary Emergency Contact: [name], [name] Mobile Phone: [phone] Relation: Son  Code Status: DNR Goals of Care: Advanced Directive information Advanced Directives 07/27/2019  Does Patient Have a Medical Advance Directive? Yes  Type of Advance Directive Out of facility DNR (pink MOST or yellow form)  Does patient want to make changes to medical advance directive? No - Patient declined  Copy of [place] in Chart? -  Would patient like information on creating a medical advance directive? -  Pre-existing out of facility DNR order (yellow form or pink MOST form) Pink MOST/Yellow Form most recent copy in chart - Physician notified to receive inpatient order   Chief Complaint  Patient presents with  . New Admit To SNF    New admission to SNF     HPI: Patient is a 86 y.o. male with h/o afib with pacemaker, CKD3, COPD, CHF, prostate ca s/p external beam radiation, anemia of chronic disease (gets iron through [name]), degenerative disc disease, scoliosis, neuropathy, h/o DVT, macular degeneration, hearing loss with hearing aids, L3 spinal stenosis seen today for admission to [facility] after a hospitalization at [name] from 5/25-07/25/19.  He had been staying at  home with his 88 yo wife and developed increased weakness over 10 days, lower extremity edema, and shortness of breath.  He reached a point where he needed assistance just to ambulate with his walker which was different for him.  He had a fall 3 weeks prior but had recuperated from that when this all started.    He was found to have pulmonary vascular congestion and afib with RVR.  He was started on lasix and cardizem.  Pacemaker showed a 78% burden of afib and echo showed EF of just 15-20% with global hypokinesis.  His amiodarone and beta blocker were held and eliquis dose reduced at his advanced age.    Hospice was considered, but he opted to undergo further cardiology evaluation and had a TEE and successful cardioversion on 6/8.    When seen, he admitted to fatigue and persistent severe edema in his extremities.  He is not sob at rest, but has not done a lot yet as he has not been here long.    He has a LLE wound with cellulitis treated with doxycycline.  He also was ruled out for DVT.    He had both of his moderna covid vaccines 1/19 and 2/16.  Past Medical History:  Diagnosis Date  . Abdominal hernia   . Abnormality of gait    due to low back pain , uses  cane and walker  . Anemia of chronic disease    [name]  . Arthritis   . Bilateral lower extremity edema    wears TED hose  . Cancer [name])    prostate- treated with radiation  .  Cardiac pacemaker in situ 10/11/2006   medtronic  . Chronic iron deficiency anemia oncologist/ hematoloist-- dr Alen [name]   treatment IV Iron infusions  . Chronic low back pain   . CKD (chronic kidney disease), stage III   . COPD with emphysema [name])    pulmologist-  dr Elsworth [name]  . DDD (degenerative disc disease), lumbosacral   . Degenerative scoliosis   . Dyspnea    with exertion  . Full dentures   . GERD (gastroesophageal reflux disease)   . Gross hematuria   . Headache    history of migraines- "way in the past"  . Hereditary and idiopathic  peripheral neuropathy    bilateral lower leg  . Hiatal hernia   . History of DVT of lower extremity 10/19/2011   chronic distal popliteal right lower extremity  . History of external beam radiation therapy 2008   prostate cancer  . History of pancreatitis 2013  . History of pneumothorax 09/2006   iatrogenic-- in setting cardiac pacemaker placement  . History of prostate cancer 2008   s/p  external radiation therapy  . Hypertension   . Injury of left ulnar nerve 06/2015   Dr. Jannifer [name]   . Left carpal tunnel syndrome   . Lesion of bladder   . Macular degeneration of both eyes   . Macular degeneration of right eye   . Mild mitral regurgitation   . Mild mitral regurgitation    and trace AI 07/22/08 echo and 10/2010  . Mild pulmonary hypertension ([place])   . Nocturia   . OSA on CPAP   . PAF (paroxysmal atrial fibrillation) ([name]) 10/2011   1st episode documented 09/ 2013 admission for gallstons/ pancreatitis and prior to cholecystectomy , went back in NSR without interventeion  . Pancreatitis 10/2011   gallstone induced  . Presence of permanent cardiac pacemaker   . Prostate cancer ([name])    Dr. Karsten Ro  . Sinus node dysfunction ([name])    s/p  cardiac pacemaker placement 10-11-2006  . Sinus node dysfunction ([name])    pacemaker  . Spinal stenosis    L2  . Venous insufficiency   . Wears glasses   . Wears hearing aid in both ears    Past Surgical History:  Procedure Laterality Date  . BALLOON DILATION N/A 01/14/2015   Procedure: BALLOON DILATION;  Surgeon: Garlan Fair, MD;  Location: [name];  Service: Endoscopy;  Laterality: N/A;  . BALLOON DILATION N/A 10/12/2017   Procedure: BALLOON DILATION;  Surgeon: Otis Brace, MD;  Location: [facility];  Service: Gastroenterology;  Laterality: N/A;  . BIOPSY  10/12/2017   Procedure: BIOPSY;  Surgeon: Otis Brace, MD;  Location: [name];  Service: Gastroenterology;;  . CARDIAC CATHETERIZATION  08-29-2000   [name]   no  significant obstructive CAD, intact globle LV size and systolic function with regional wall motion abnormalities noted,  ?coronary spasm producing wall motion abnormality, elevated CPK and chest pain (ef 55%)  . CARDIAC PACEMAKER PLACEMENT  10-11-2006    dr Leonia Reeves   W/  ATRIAL LEAD REVISION 10-12-2006   (medtronic)  . CARDIOVASCULAR STRESS TEST  11-18-2011    dr Irish Lack   Low risk nuclear study w/ no ishemia/  normal wall motion,  post-stress ef 48% (LVEF 56% on prior study 2008)  . CARDIOVERSION N/A 04/02/2019   Procedure: CARDIOVERSION;  Surgeon: Josue Hector, MD;  Location: [name];  Service: Cardiovascular;  Laterality: N/A;  . CARDIOVERSION N/A 07/24/2019   Procedure: CARDIOVERSION;  Surgeon: Meda Coffee,  Jamse Belfast, MD;  Location: [name];  Service: Cardiovascular;  Laterality: N/A;  . CARPAL TUNNEL RELEASE Left 11/14/2014   Procedure: CARPAL TUNNEL RELEASE LEFT THUMB;  Surgeon: Daryll Brod, MD;  Location: [place];  Service: Orthopedics;  Laterality: Left;  ANESTHESIA:  IV REGIONAL FAB  . CATARACT EXTRACTION W/ INTRAOCULAR LENS  IMPLANT, BILATERAL    . CHOLECYSTECTOMY  10/23/2011  . CHOLECYSTECTOMY  10/23/2011   Procedure: CHOLECYSTECTOMY;  Surgeon: Rolm Bookbinder, MD;  Location: [place];  Service: General;  Laterality: N/A;  with intraoperative cholangiogram  . COLONOSCOPY    . ESOPHAGOGASTRODUODENOSCOPY (EGD) WITH PROPOFOL N/A 01/14/2015   Procedure: ESOPHAGOGASTRODUODENOSCOPY (EGD) WITH PROPOFOL;  Surgeon: Garlan Fair, MD;  Location: [facility];  Service: Endoscopy;  Laterality: N/A;  . ESOPHAGOGASTRODUODENOSCOPY (EGD) WITH PROPOFOL N/A 10/12/2017   Procedure: ESOPHAGOGASTRODUODENOSCOPY (EGD) WITH PROPOFOL;  Surgeon: Otis Brace, MD;  Location: [facility];  Service: Gastroenterology;  Laterality: N/A;  . I & D EXTREMITY Left 10/20/2012   Procedure: LEFT LEG IRRIGATION AND DEBRIDEMENT, AND WOUND VAC APPLICATION;  Surgeon: Marianna Payment, MD;  Location:  [facility];  Service: Orthopedics;  Laterality: Left;  . I & D EXTREMITY Left 10/22/2012   Procedure: IRRIGATION AND DEBRIDEMENT EXTREMITY;  Surgeon: Marianna Payment, MD;  Location: [facility];  Service: Orthopedics;  Laterality: Left;  . INGUINAL HERNIA REPAIR Left yrs ago  . LAPAROSCOPIC ASSISTED VENTRAL HERNIA REPAIR  08-08-2009   dr Excell Seltzer   AND OPEN REPAIR RECURRENT LEFT INGUINAL HERNIA  . LAPAROSCOPIC INGUINAL HERNIA REPAIR Left 07-24-1999    dr Excell Seltzer  . OPEN VENTRAL HERNIA REPAIR /  LYSIS ADHESIONS  09-23-2010    dr Donne Hazel  . ORIF ELBOW FRACTURE Left 03/29/2015   Procedure: LEFT ELBOW OPEN REDUCTION INTERNAL FIXATION (ORIF) DISTAL HUMERUS FRACTURE WITH OLECRANON OSTEOTOMY AND ULNAR NERVE RELEASE;  Surgeon: Roseanne Kaufman, MD;  Location: [facility];  Service: Orthopedics;  Laterality: Left;  . PPM GENERATOR CHANGEOUT N/A 02/10/2018   Procedure: PPM GENERATOR CHANGEOUT;  Surgeon: [name] Sprang, MD;  Location: [facility] [place] [facility];  Service: Cardiovascular;  Laterality: N/A;  . REPAIR SUPRAUMBILICAL HERNIA   [phone]   dr Excell Seltzer  . SHOULDER ARTHROSCOPY WITH DISTAL CLAVICLE RESECTION Right 11-07-2007   dr Rhona Raider   [facility]  . SKIN SPLIT GRAFT Left 10/22/2012   Procedure: SKIN GRAFT SPLIT THICKNESS;  Surgeon: Marianna Payment, MD;  Location: [facility];  Service: Orthopedics;  Laterality: Left;  . TEE WITHOUT CARDIOVERSION N/A 07/24/2019   Procedure: TRANSESOPHAGEAL ECHOCARDIOGRAM (TEE);  Surgeon: Dorothy Spark, MD;  Location: [name];  Service: Cardiovascular;  Laterality: N/A;  . TOTAL KNEE ARTHROPLASTY Right 1990's  . TRANSTHORACIC ECHOCARDIOGRAM  06-12-2012   dr Irish Lack   ef 50-55%/ mild LAE/ mild MR and TR/  AV sclerosis without stenosis/  RVSP 53mmHg  . TRANSURETHRAL RESECTION OF BLADDER TUMOR N/A 07/15/2017   Procedure: CYSTOSCOPY TRANSURETHRAL RESECTION OF BLADDER TUMOR (TURBT);  Surgeon: [name] Rhodes, MD;  Location: [name];   Service: Urology;  Laterality: N/A;    Social History   Socioeconomic History  . Marital status: Married    Spouse name: martha  . Number of children: 3  . Years of education: 64  . Highest education level: 12th grade  Occupational History  . 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 Smoking status: Former Smoker  Packs/day: 1.50    Years: 35.00    Pack years: 52.50    Types: Cigarettes    Quit date: 02/15/1973    Years since quitting: 46.4  . Smokeless tobacco: Never Used  Vaping Use  . Vaping Use: Never used  Substance and Sexual Activity  . Alcohol use: No  . Drug use: No  . Sexual activity: Never  Other Topics Concern  . Not on file  Social History Narrative   Lives at home w/ his wife, [name]   Right-handed   Drinks about 1 soda per day, decaf coffee   Social Determinants of Health   Financial Resource Strain:   . Difficulty of Paying Living Expenses:   Food Insecurity:   . Worried About Charity fundraiser in the Last Year:   . Arboriculturist in the Last Year:   Transportation Needs:   . Film/video editor (Medical):   Marland Kitchen Lack of Transportation (Non-Medical):   Physical Activity:   . Days of Exercise per Week:   . Minutes of Exercise per Session:   Stress:   . Feeling of Stress :   Social Connections:   . Frequency of Communication with Friends and Family:   . Frequency of Social Gatherings with Friends and Family:   . Attends Religious Services:   . Active Member of Clubs or Organizations:   . Attends Archivist Meetings:   Marland Kitchen Marital Status:     reports that he quit smoking about 46 years ago. His smoking use included cigarettes. He has a 52.50 pack-year smoking history. He has never used smokeless tobacco. He reports that he does not drink alcohol and does not use drugs.  Functional Status Survey:    Family History  Problem Relation Age of Onset  . Heart disease Father   . Heart  disease Mother   . Colon cancer Mother   . Stroke Mother   . Cancer Sister   . Heart disease Sister   . Heart attack Neg Hx   . Hypertension Neg Hx     Health Maintenance  Topic Date Due  . INFLUENZA VACCINE  09/16/2019  . TETANUS/TDAP  11/29/2022  . COVID-19 Vaccine  Completed  . PNA vac Low Risk Adult  Completed    No Known Allergies  Outpatient Encounter Medications as of 07/27/2019  Medication Sig  . acetaminophen (TYLENOL) 650 MG CR tablet Take 650 mg by mouth 2 (two) times daily.  Marland Kitchen amiodarone (PACERONE) 200 MG tablet Take 1 tablet (200 mg total) by mouth 2 (two) times daily.  Marland Kitchen apixaban (ELIQUIS) 2.5 MG TABS tablet Take 1 tablet (2.5 mg total) by mouth 2 (two) times daily.  . Calcium Carbonate-Vitamin D (CALCIUM-D PO) Take 1 tablet by mouth every evening.   . Cholecalciferol (VITAMIN D3 PO) Take 2,000 Units by mouth daily.  . diphenhydrAMINE (BENADRYL) 25 MG tablet Take 25 mg by mouth daily as needed for allergies.   . ferrous sulfate 325 (65 FE) MG tablet Take 650 mg by mouth daily with breakfast.  . fluticasone (FLONASE) 50 MCG/ACT nasal spray Place 1 spray into both nostrils daily as needed for allergies or rhinitis.  . Folic Acid-Vit C7-ELF Y10 (FOLBEE) 2.5-25-1 MG TABS tablet Take 1 tablet by mouth daily.  . furosemide (LASIX) 20 MG tablet Take 20 mg by mouth daily.   Marland Kitchen losartan (COZAAR) 25 MG tablet Take 0.5 tablets (12.5 mg total) by mouth daily.  . metoprolol succinate (TOPROL-XL) 25 MG  24 hr tablet Take 1 tablet (25 mg total) by mouth daily.  . mirabegron ER (MYRBETRIQ) 50 MG TB24 tablet Take 50 mg by mouth daily.  . Multiple Vitamins-Minerals (PRESERVISION AREDS 2 PO) Take 1 each by mouth in the morning and at bedtime.  Marland Kitchen oxyCODONE (OXY IR/ROXICODONE) 5 MG immediate release tablet Take 1 tablet (5 mg total) by mouth every 6 (six) hours as needed for severe pain.  Marland Kitchen oxymetazoline (AFRIN) 0.05 % nasal spray Place 1 spray into both nostrils 2 (two) times daily as  needed for congestion.  . pantoprazole (PROTONIX) 40 MG tablet Take 1 tablet (40 mg total) by mouth daily before breakfast.  . polyethylene glycol (MIRALAX) 17 g packet Take 17 g by mouth daily as needed.  . predniSONE (DELTASONE) 10 MG tablet Take 10 mg by mouth daily with breakfast.  . psyllium (METAMUCIL SMOOTH TEXTURE) 28 % packet Take 1 packet by mouth daily as needed. Mix with 8 oz liquid  . senna-docusate (SENOKOT-S) 8.6-50 MG tablet Take 1 tablet by mouth at bedtime as needed for mild constipation.  Marland Kitchen umeclidinium-vilanterol (ANORO ELLIPTA) 62.5-25 MCG/INH AEPB Inhale 1 puff into the lungs daily.   . vitamin B-12 (CYANOCOBALAMIN) 500 MCG tablet Take 500 mcg by mouth. Three days a week   No facility-administered encounter medications on file as of 07/27/2019.    Review of Systems  Constitutional: Positive for malaise/fatigue. Negative for chills and fever.  HENT: Positive for hearing loss. Negative for congestion.        Bilateral hearing aids (oddly there was water in the case when I helped him put these on so we could communicate); hoarseness  Eyes: Positive for blurred vision.       Macular degeneration  Respiratory: Negative for cough and shortness of breath.   Cardiovascular: Positive for leg swelling. Negative for chest pain, palpitations, orthopnea and PND.  Gastrointestinal: Negative for abdominal pain, blood in stool, constipation, diarrhea and melena.  Genitourinary: Positive for frequency. Negative for dysuria.       Has OAB on myrbetriq, h/o prostate ca s/p xrt  Musculoskeletal: Positive for back pain. Negative for falls.  Skin: Negative for itching and rash.    Vitals:   07/27/19 1052  BP: 111/69  Pulse: 81  Weight: 163 lb 6.4 oz (74.1 kg)  Height: 5\' 10"  (1.778 m)   Body mass index is 23.45 kg/m. Physical Exam Vitals reviewed.  Constitutional:      General: He is not in acute distress.    Appearance: He is ill-appearing. He is not toxic-appearing.  HENT:      Head: Normocephalic and atraumatic.     Ears:     Comments: HOH, but able to hear me and have conversation once hearing aids put in    Nose: Nose normal.     Mouth/Throat:     Pharynx: No oropharyngeal exudate.     Comments: hoarse Eyes:     Extraocular Movements: Extraocular movements intact.     Pupils: Pupils are equal, round, and reactive to light.  Neck:     Comments: No JVD Cardiovascular:     Rate and Rhythm: Normal rate and regular rhythm.     Pulses: Normal pulses.     Heart sounds: Normal heart sounds.  Pulmonary:     Effort: Pulmonary effort is normal.     Breath sounds: Normal breath sounds. No rales.  Abdominal:     General: Bowel sounds are normal. There is no distension.  Palpations: Abdomen is soft. There is no mass.     Tenderness: There is no abdominal tenderness. There is no guarding or rebound.  Musculoskeletal:        General: Normal range of motion.     Cervical back: Neck supple.     Right lower leg: Edema present.     Left lower leg: Edema present.     Comments: 2+ edema of all extremities (on prednisone also)  Skin:    General: Skin is warm and dry.     Comments: No residual erythema of left leg, wound is wrapped  Neurological:     General: No focal deficit present.     Mental Status: He is alert and oriented to person, place, and time.     Gait: Gait abnormal.     Comments: Pleasant, conversive, appropriate--says "I know I'm not talking plain" but was doing very well; was using walker independently at home prior to latest events  Psychiatric:        Mood and Affect: Mood normal.        Thought Content: Thought content normal.        Judgment: Judgment normal.     Labs reviewed: Basic Metabolic Panel: Recent Labs    07/23/19 0322 07/23/19 0322 07/24/19 0350 07/24/19 1540 07/25/19 0321  NA 136   < > 139 139 140  K 4.5   < > 4.9 4.5 4.3  CL 96*   < > 100 102 102  CO2 32   < > 29 31 30   GLUCOSE 105*   < > 87 135* 87  BUN 50*   < >  47* 44* 42*  CREATININE 1.42*   < > 1.39* 1.23 1.35*  CALCIUM 8.3*   < > 8.4* 8.0* 7.9*  MG 2.1  --  2.1  --  2.0   < > = values in this interval not displayed.   Liver Function Tests: Recent Labs    06/27/19 1221 07/10/19 1620  AST  --  38  ALT  --  30  ALKPHOS  --  81  BILITOT  --  1.9*  PROT 5.8* 5.7*  ALBUMIN  --  3.4*   No results for input(s): LIPASE, AMYLASE in the last 8760 hours. No results for input(s): AMMONIA in the last 8760 hours. CBC: Recent Labs    07/15/19 0303 07/15/19 0303 07/16/19 0234 07/16/19 0234 07/19/19 1411 07/20/19 0405 07/23/19 0322 07/24/19 0350 07/25/19 0321  WBC 10.3   < > 9.3   < > 8.5   < > 7.8 7.5 6.6  NEUTROABS 9.0*  --  8.5*  --  7.6  --   --   --   --   HGB 13.1   < > 11.9*   < > 12.6*   < > 11.3* 11.1* 10.4*  HCT 43.1   < > 39.0   < > 40.2   < > 36.4* 36.6* 33.9*  MCV 109.4*   < > 111.1*   < > 108.1*   < > 107.7* 111.2* 109.0*  PLT 136*   < > 115*   < > 142*   < > 149* 158 147*   < > = values in this interval not displayed.   Cardiac Enzymes: Recent Labs    06/27/19 1221  CKTOTAL 51   BNP: Invalid input(s): POCBNP No results found for: HGBA1C Lab Results  Component Value Date   TSH 1.512 07/11/2019   Lab Results  Component Value  Date   [id number] 532 06/27/2019   Lab Results  Component Value Date   FOLATE 10.4 06/27/2019   Lab Results  Component Value Date   IRON 88 05/18/2016   TIBC 281 05/18/2016   FERRITIN 98 05/18/2016    Imaging and Procedures obtained prior to SNF admission: ECHO TEE  Result Date: 07/24/2019    TRANSESOPHOGEAL ECHO REPORT   Patient Name:   [name] Date of Exam: 07/24/2019 Medical Rec #:  [id number]         Height:       70.0 in Accession #:    [id number]        Weight:       161.6 lb Date of Birth:  06-24-1924          BSA:          1.906 m Patient Age:    10 years          BP:           109/57 mmHg Patient Gender: M                 HR:           75 bpm. Exam Location:  Inpatient  Procedure: Transesophageal Echo, Cardiac Doppler and Color Doppler Indications:    atrial fibrillation  History:        Patient has prior history of Echocardiogram examinations. CHF;                 Mitral Valve Disease.  Sonographer:    Clayton Lefort RDCS (AE) Referring Phys: [id number] ANGELA NICOLE DUKE PROCEDURE: The transesophogeal probe was passed without difficulty through the esophogus of the patient. Sedation performed by different physician. Image quality was good. The patient developed no complications during the procedure. The patient also received 6 mg of iv Etomidate for conscious sedation. IMPRESSIONS  1. No thrombus was seen in the left atrial appendage or left atrium.  2. Left ventricular ejection fraction, by estimation, is <20%. The left ventricle has severely decreased function. The left ventricle demonstrates global hypokinesis. The left ventricular internal cavity size was moderately dilated. Left ventricular diastolic function could not be evaluated.  3. Right ventricular systolic function is moderately reduced. The right ventricular size is moderately enlarged. There is moderately elevated pulmonary artery systolic pressure. The estimated right ventricular systolic pressure is 74.0 mmHg.  4. Low LAA filling and emptying velocities. Left atrial size was severely dilated. No left atrial/left atrial appendage thrombus was detected.  5. Right atrial size was moderately dilated.  6. The mitral valve is degenerative. Moderate to severe mitral valve regurgitation. No evidence of mitral stenosis.  7. The tricuspid valve is degenerative. Tricuspid valve regurgitation is moderate.  8. The aortic valve is tricuspid. Aortic valve regurgitation is trivial. Moderate aortic valve stenosis.  9. There is Moderate (Grade III) atheroma plaque involving the descending aorta. 10. Evidence of atrial level shunting detected by color flow Doppler. There is a small patent foramen ovale with predominantly left to right  shunting across the atrial septum. 11. The patient also received 6 mg of iv Etomidate for conscious sedation. Conclusion(s)/Recommendation(s): No LA/LAA thrombus identified. Successful cardioversion performed with restoration of normal sinus rhythm. FINDINGS  Left Ventricle: Left ventricular ejection fraction, by estimation, is <20%. The left ventricle has severely decreased function. The left ventricle demonstrates global hypokinesis. The left ventricular internal cavity size was moderately dilated. There is no left ventricular hypertrophy. Left ventricular diastolic function could  not be evaluated. Right Ventricle: The right ventricular size is moderately enlarged. No increase in right ventricular wall thickness. Right ventricular systolic function is moderately reduced. There is moderately elevated pulmonary artery systolic pressure. The tricuspid  regurgitant velocity is 2.84 m/s, and with an assumed right atrial pressure of 8 mmHg, the estimated right ventricular systolic pressure is 54.6 mmHg. Left Atrium: Low LAA filling and emptying velocities. Left atrial size was severely dilated. No left atrial/left atrial appendage thrombus was detected. Right Atrium: Right atrial size was moderately dilated. Pericardium: Trivial pericardial effusion is present. Mitral Valve: The mitral valve is degenerative in appearance. Moderate to severe mitral valve regurgitation. No evidence of mitral valve stenosis. Tricuspid Valve: The tricuspid valve is degenerative in appearance. Tricuspid valve regurgitation is moderate. Aortic Valve: The aortic valve is tricuspid. . There is severe thickening and severe calcifcation of the aortic valve. Aortic valve regurgitation is trivial. Moderate aortic stenosis is present. There is severe thickening of the aortic valve. There is severe calcifcation of the aortic valve. Pulmonic Valve: The pulmonic valve was normal in structure. Pulmonic valve regurgitation is trivial. Aorta: The  ascending aorta was not well visualized. There is moderate (Grade III) atheroma plaque involving the descending aorta. IAS/Shunts: Evidence of atrial level shunting detected by color flow Doppler. A small patent foramen ovale is detected with predominantly left to right shunting across the atrial septum. Additional Comments: A pacer wire is visualized.  TRICUSPID VALVE TR Peak grad:   32.3 mmHg TR Vmax:        284.00 cm/s Ena Dawley MD Electronically signed by Ena Dawley MD Signature Date/Time: 07/24/2019/12:30:43 PM    Final     Assessment/Plan 1. Acute on chronic systolic congestive heart failure ([name]) -EF quite reduced, but had been in rapid afib -weights per facility protocol, elevate feet -compression hose when up for day -oob to recliner with elevation of feet in am and try again in afternoon if he can tolerate it to help with swelling -call with weight gain concerns so lasix can be adjusted (on 20mg  daily) -f/u bmp -cont losartan and toprol  2. Atrial fibrillation with RVR ([name] [name]) -s/p cardioversion and now in NSR, cont eliquis 2.5mg  po bid with his age and creatinine clearance, amiodarone 200mg  po bid and metoprolol succinate 25mg  daily -EF may improve with control of afib if echo repeated in the future  3. Centrilobular emphysema ([name] [name]) -cont anoro, daily prednisone which also contributes to considerable edema he has  4. Acute kidney injury ([place]) -had resolved with cardioversion and volume mgt -f/u bmp here   5. Iron deficiency anemia, unspecified iron deficiency anemia type -on daily iron supplement, cont bowel regimen also  6. General weakness -here for PT, OT after hospitalization; goal to return home with his wife  7. Overactive bladder -cont myrbetriq therapy; suspect is related to radiation for prostate ca  8. 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 Closed fracture of sacrum, unspecified portion of  sacrum, sequela -just in feb of this year--he'd recovered and now dealing with cardiac issues -continue oxycodone for severe pain very 6 hrs if needed  10. Stage 3b chronic kidney disease -Avoid nephrotoxic agents like nsaids, dose adjust renally excreted meds, hydrate within fluid restriction.  11. DNR (do not resuscitate) - Do not attempt resuscitation (DNR) in place at [name] based on discussion with patient   Family/ staff Communication: discussed with ADON  Labs/tests ordered:  Cbc,  bmp, f/u with Drs Irish Lack and Caryl Comes as planned  Cheryllynn Sarff L. Lue Sykora, D.O. [facility] [address], [place] [address] Cell Phone (Mon-Fri 8am-5pm):  [phone] On Call:  [phone] & follow prompts after 5pm & weekends Office Phone:  [phone] Office Fax:  [phone]

## 2019-07-31 ENCOUNTER — Ambulatory Visit: Payer: Medicare Other | Admitting: Pulmonary Disease

## 2019-07-31 LAB — CBC AND DIFFERENTIAL
HCT: 35 — AB (ref 41–53)
Hemoglobin: 11.4 — AB (ref 13.5–17.5)
Platelets: 168 (ref 150–399)
WBC: 7

## 2019-07-31 LAB — BASIC METABOLIC PANEL
BUN: 21 (ref 4–21)
CO2: 22 (ref 13–22)
Chloride: 101 (ref 99–108)
Creatinine: 1.1 (ref 0.6–1.3)
Glucose: 105
Potassium: 4 (ref 3.4–5.3)
Sodium: 137 (ref 137–147)

## 2019-07-31 LAB — COMPREHENSIVE METABOLIC PANEL
Calcium: 8.7 (ref 8.7–10.7)
GFR calc Af Amer: 69.59
GFR calc non Af Amer: 60.04

## 2019-07-31 LAB — CBC: RBC: 3.46 — AB (ref 3.87–5.11)

## 2019-08-01 ENCOUNTER — Non-Acute Institutional Stay (SKILLED_NURSING_FACILITY): Payer: Medicare Other | Admitting: Internal Medicine

## 2019-08-01 ENCOUNTER — Other Ambulatory Visit: Payer: Self-pay | Admitting: *Deleted

## 2019-08-01 DIAGNOSIS — J432 Centrilobular emphysema: Secondary | ICD-10-CM | POA: Diagnosis not present

## 2019-08-01 DIAGNOSIS — N1832 Chronic kidney disease, stage 3b: Secondary | ICD-10-CM | POA: Diagnosis not present

## 2019-08-01 DIAGNOSIS — I5041 Acute combined systolic (congestive) and diastolic (congestive) heart failure: Secondary | ICD-10-CM | POA: Diagnosis not present

## 2019-08-01 DIAGNOSIS — I4891 Unspecified atrial fibrillation: Secondary | ICD-10-CM

## 2019-08-01 NOTE — Progress Notes (Signed)
This is an acute visit.  Level of care skilled.  [facility]  Chief complaint acute visit follow-up CHF.  History of present illness.  Patient is a very pleasant 86 year old [name] [name] seen today for follow-up of CHF.  Patient is here for rehab after hospitalization for CHF exacerbation.  Patient has a history of atrial fibrillation with a pacemaker as well as chronic kidney disease stage III COPD in addition to prostate cancer status post radiation treatment he also has a history of anemia of chronic disease-degenerative disc disease-scoliosis-neuropathy-history of DVT as well as macular degeneration-L3 spinal stenosis.  He was admitted to here after hospitalization for what appears to be CHF exacerbation.  He presented to the hospital with increased lower extremity edema and shortness of breath.  He also had increased weakness.  He was found to have pulmonary vascular congestion and A. fib with rapid ventricular rate he did receive Lasix and Cardizem.  His amiodarone and beta-blocker were held he has since been restarted on amiodarone 200 mg twice daily he is also on Toprol-XL 25 mg a day  His Eliquis was reduced secondary to his advanced age.  He did have a TEE and a successful cardio version on June 8.  He also was treated with doxycycline for lower extremity cellulitis which appears to have improved.  He continues to have significant weakness but appears clinically to be stable in regards to CHF although he is vulnerable his ejection fraction was found to be 15 to 20% with echo showing global hypokinesis.  He is on Lasix 20 mg a day he is prone to acute kidney injury with a history of chronic kidney disease but labs are encouraging showing a creatinine of 1.06 BUN of 21.4 sodium of 137 and potassium of 4 on lab done on June 15  Currently he is lying in bed comfortably he does not really have any complaints does not really complain of shortness of breath although  apparently he is not very active.  I do not see significant increase in edema.  Past Medical History:  Diagnosis Date  . Abdominal hernia   . Abnormality of gait    due to low back pain , uses  cane and walker  . Anemia of chronic disease    Dr. Alen Blew  . Arthritis   . Bilateral lower extremity edema    wears TED hose  . Cancer [facility])    prostate- treated with radiation  . Cardiac pacemaker in situ 10/11/2006   medtronic  . Chronic iron deficiency anemia oncologist/ hematoloist-- dr Alen Blew   treatment IV Iron infusions  . Chronic low back pain   . CKD (chronic kidney disease), stage III   . COPD with emphysema [facility])    pulmologist-  dr Elsworth Soho  . DDD (degenerative disc disease), lumbosacral   . Degenerative scoliosis   . Dyspnea    with exertion  . Full dentures   . GERD (gastroesophageal reflux disease)   . Gross hematuria   . Headache    history of migraines- "way in the past"  . Hereditary and idiopathic peripheral neuropathy    bilateral lower leg  . Hiatal hernia   . History of DVT of lower extremity 10/19/2011   chronic distal popliteal right lower extremity  . History of external beam radiation therapy 2008   prostate cancer  . History of pancreatitis 2013  . History of pneumothorax 09/2006   iatrogenic-- in setting cardiac pacemaker placement  . History of prostate  cancer 2008   s/p  external radiation therapy  . Hypertension   . Injury of left ulnar nerve 06/2015   Dr. Jannifer Franklin   . Left carpal tunnel syndrome   . Lesion of bladder   . Macular degeneration of both eyes   . Macular degeneration of right eye   . Mild mitral regurgitation   . Mild mitral regurgitation    and trace AI 07/22/08 echo and 10/2010  . Mild pulmonary hypertension ([place])   . Nocturia   . OSA on CPAP   . PAF (paroxysmal atrial fibrillation) ([place]) 10/2011   1st episode documented 09/ 2013 admission for gallstons/ pancreatitis and prior to  cholecystectomy , went back in NSR without interventeion  . Pancreatitis 10/2011   gallstone induced  . Presence of permanent cardiac pacemaker   . Prostate cancer ([place])    Dr. Karsten Ro  . Sinus node dysfunction ([facility])    s/p  cardiac pacemaker placement 10-11-2006  . Sinus node dysfunction ([facility])    pacemaker  . Spinal stenosis    L2  . Venous insufficiency   . Wears glasses   . Wears hearing aid in both ears         Past Surgical History:  Procedure Laterality Date  . BALLOON DILATION N/A 01/14/2015   Procedure: BALLOON DILATION;  Surgeon: Garlan Fair, MD;  Location: [facility];  Service: Endoscopy;  Laterality: N/A;  . BALLOON DILATION N/A 10/12/2017   Procedure: BALLOON DILATION;  Surgeon: Otis Brace, MD;  Location: [place] ENDOSCOPY;  Service: Gastroenterology;  Laterality: N/A;  . BIOPSY  10/12/2017   Procedure: BIOPSY;  Surgeon: Otis Brace, MD;  Location: [place] ENDOSCOPY;  Service: Gastroenterology;;  . CARDIAC CATHETERIZATION  08-29-2000   [place]   no significant obstructive CAD, intact globle LV size and systolic function with regional wall motion abnormalities noted,  ?coronary spasm producing wall motion abnormality, elevated CPK and chest pain (ef 55%)  . CARDIAC PACEMAKER PLACEMENT  10-11-2006    dr Leonia Reeves   W/  ATRIAL LEAD REVISION 10-12-2006   (medtronic)  . CARDIOVASCULAR STRESS TEST  11-18-2011    dr Irish Lack   Low risk nuclear study w/ no ishemia/  normal wall motion,  post-stress ef 48% (LVEF 56% on prior study 2008)  . CARDIOVERSION N/A 04/02/2019   Procedure: CARDIOVERSION;  Surgeon: Josue Hector, MD;  Location: [facility] ENDOSCOPY;  Service: Cardiovascular;  Laterality: N/A;  . CARDIOVERSION N/A 07/24/2019   Procedure: CARDIOVERSION;  Surgeon: Dorothy Spark, MD;  Location: [facility] ENDOSCOPY;  Service: Cardiovascular;  Laterality: N/A;  . CARPAL TUNNEL RELEASE Left 11/14/2014   Procedure: CARPAL TUNNEL RELEASE LEFT THUMB;  Surgeon:  Daryll Brod, MD;  Location: [place];  Service: Orthopedics;  Laterality: Left;  ANESTHESIA:  IV REGIONAL FAB  . CATARACT EXTRACTION W/ INTRAOCULAR LENS  IMPLANT, BILATERAL    . CHOLECYSTECTOMY  10/23/2011  . CHOLECYSTECTOMY  10/23/2011   Procedure: CHOLECYSTECTOMY;  Surgeon: Rolm Bookbinder, MD;  Location: [place];  Service: General;  Laterality: N/A;  with intraoperative cholangiogram  . COLONOSCOPY    . ESOPHAGOGASTRODUODENOSCOPY (EGD) WITH PROPOFOL N/A 01/14/2015   Procedure: ESOPHAGOGASTRODUODENOSCOPY (EGD) WITH PROPOFOL;  Surgeon: Garlan Fair, MD;  Location: [facility] ENDOSCOPY;  Service: Endoscopy;  Laterality: N/A;  . ESOPHAGOGASTRODUODENOSCOPY (EGD) WITH PROPOFOL N/A 10/12/2017   Procedure: ESOPHAGOGASTRODUODENOSCOPY (EGD) WITH PROPOFOL;  Surgeon: Otis Brace, MD;  Location: [place];  Service: Gastroenterology;  Laterality: N/A;  . I & D EXTREMITY Left 10/20/2012  Procedure: LEFT LEG IRRIGATION AND DEBRIDEMENT, AND WOUND VAC APPLICATION;  Surgeon: Marianna Payment, MD;  Location: [facility];  Service: Orthopedics;  Laterality: Left;  . I & D EXTREMITY Left 10/22/2012   Procedure: IRRIGATION AND DEBRIDEMENT EXTREMITY;  Surgeon: Marianna Payment, MD;  Location: [facility];  Service: Orthopedics;  Laterality: Left;  . INGUINAL HERNIA REPAIR Left yrs ago  . LAPAROSCOPIC ASSISTED VENTRAL HERNIA REPAIR  08-08-2009   dr Excell Seltzer   AND OPEN REPAIR RECURRENT LEFT INGUINAL HERNIA  . LAPAROSCOPIC INGUINAL HERNIA REPAIR Left 07-24-1999    dr Excell Seltzer  . OPEN VENTRAL HERNIA REPAIR /  LYSIS ADHESIONS  09-23-2010    dr Donne Hazel  . ORIF ELBOW FRACTURE Left 03/29/2015   Procedure: LEFT ELBOW OPEN REDUCTION INTERNAL FIXATION (ORIF) DISTAL HUMERUS FRACTURE WITH OLECRANON OSTEOTOMY AND ULNAR NERVE RELEASE;  Surgeon: Roseanne Kaufman, MD;  Location: [place];  Service: Orthopedics;  Laterality: Left;  . PPM GENERATOR CHANGEOUT N/A 02/10/2018   Procedure: PPM GENERATOR CHANGEOUT;   Surgeon: Deboraha Sprang, MD;  Location: [place] CV LAB;  Service: Cardiovascular;  Laterality: N/A;  . REPAIR SUPRAUMBILICAL HERNIA   [phone]   dr Excell Seltzer  . SHOULDER ARTHROSCOPY WITH DISTAL CLAVICLE RESECTION Right 11-07-2007   dr Rhona Raider   [place] ACROMIOPLASTY  . SKIN SPLIT GRAFT Left 10/22/2012   Procedure: SKIN GRAFT SPLIT THICKNESS;  Surgeon: Marianna Payment, MD;  Location: [facility];  Service: Orthopedics;  Laterality: Left;  . TEE WITHOUT CARDIOVERSION N/A 07/24/2019   Procedure: TRANSESOPHAGEAL ECHOCARDIOGRAM (TEE);  Surgeon: Dorothy Spark, MD;  Location: [facility] ENDOSCOPY;  Service: Cardiovascular;  Laterality: N/A;  . TOTAL KNEE ARTHROPLASTY Right 1990's  . TRANSTHORACIC ECHOCARDIOGRAM  06-12-2012   dr Irish Lack   ef 50-55%/ mild LAE/ mild MR and TR/  AV sclerosis without stenosis/  RVSP 37mmHg  . TRANSURETHRAL RESECTION OF BLADDER TUMOR N/A 07/15/2017   Procedure: CYSTOSCOPY TRANSURETHRAL RESECTION OF BLADDER TUMOR (TURBT);  Surgeon: Kathie Rhodes, MD;  Location: [facility];  Service: Urology;  Laterality: N/A;    Social History        Socioeconomic History  . Marital status: Married    Spouse name: martha  . Number of children: 3  . Years of education: 65  . Highest education level: 12th grade  Occupational History  . 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 Smoking status: Former Smoker    Packs/day: 1.50    Years: 35.00    Pack years: 52.50    Types: Cigarettes    Quit date: 02/15/1973    Years since quitting: 46.4  . Smokeless tobacco: Never Used  Vaping Use  . Vaping Use: Never used  Substance and Sexual Activity  . Alcohol use: No  . Drug use: No  . Sexual activity: Never  Other Topics Concern  . Not on file  Social History Narrative   Lives at home w/ his wife, [name]   Right-handed   Drinks about 1 soda per day, decaf coffee    Social Determinants of Health      Financial Resource Strain:   . Difficulty of Paying Living Expenses:   Food Insecurity:   . Worried About Charity fundraiser in the Last Year:   . Arboriculturist in the Last Year:   Transportation Needs:   . Film/video editor (Medical):   Marland Kitchen Lack of  Transportation (Non-Medical):   Physical Activity:   . Days of Exercise per Week:   . Minutes of Exercise per Session:   Stress:   . Feeling of Stress :   Social Connections:   . Frequency of Communication with Friends and Family:   . Frequency of Social Gatherings with Friends and Family:   . Attends Religious Services:   . Active Member of Clubs or Organizations:   . Attends Archivist Meetings:   Marland Kitchen Marital Status:     reports that he quit smoking about 46 years ago. His smoking use included cigarettes. He has a 52.50 pack-year smoking history. He has never used smokeless tobacco. He reports that he does not drink alcohol and does not use drugs.  Functional Status Survey:       Family History  Problem Relation Age of Onset  . Heart disease Father   . Heart disease Mother   . Colon cancer Mother   . Stroke Mother   . Cancer Sister   . Heart disease Sister   . Heart attack Neg Hx   . Hypertension Neg Hx         Health Maintenance  Topic Date Due  . INFLUENZA VACCINE  09/16/2019  . TETANUS/TDAP  11/29/2022  . COVID-19 Vaccine  Completed  . PNA vac Low Risk Adult  Completed    No Known Allergies      MEDICATIONS    Medication Sig  . acetaminophen (TYLENOL) 650 MG CR tablet Take 650 mg by mouth 2 (two) times daily.  Marland Kitchen amiodarone (PACERONE) 200 MG tablet Take 1 tablet (200 mg total) by mouth 2 (two) times daily.  Marland Kitchen apixaban (ELIQUIS) 2.5 MG TABS tablet Take 1 tablet (2.5 mg total) by mouth 2 (two) times daily.  . Calcium Carbonate-Vitamin D (CALCIUM-D PO) Take 1 tablet by mouth every evening.   . Cholecalciferol (VITAMIN D3 PO) Take 2,000  Units by mouth daily.  . diphenhydrAMINE (BENADRYL) 25 MG tablet Take 25 mg by mouth daily as needed for allergies.   . ferrous sulfate 325 (65 FE) MG tablet Take 650 mg by mouth daily with breakfast.  . fluticasone (FLONASE) 50 MCG/ACT nasal spray Place 1 spray into both nostrils daily as needed for allergies or rhinitis.  . Folic Acid-Vit G2-XBM W41 (FOLBEE) 2.5-25-1 MG TABS tablet Take 1 tablet by mouth daily.  . furosemide (LASIX) 20 MG tablet Take 20 mg by mouth daily.   Marland Kitchen losartan (COZAAR) 25 MG tablet Take 0.5 tablets (12.5 mg total) by mouth daily.  . metoprolol succinate (TOPROL-XL) 25 MG 24 hr tablet Take 1 tablet (25 mg total) by mouth daily.  . mirabegron ER (MYRBETRIQ) 50 MG TB24 tablet Take 50 mg by mouth daily.  . Multiple Vitamins-Minerals (PRESERVISION AREDS 2 PO) Take 1 each by mouth in the morning and at bedtime.  Marland Kitchen oxyCODONE (OXY IR/ROXICODONE) 5 MG immediate release tablet Take 1 tablet (5 mg total) by mouth every 6 (six) hours as needed for severe pain.  Marland Kitchen oxymetazoline (AFRIN) 0.05 % nasal spray Place 1 spray into both nostrils 2 (two) times daily as needed for congestion.  . pantoprazole (PROTONIX) 40 MG tablet Take 1 tablet (40 mg total) by mouth daily before breakfast.  . polyethylene glycol (MIRALAX) 17 g packet Take 17 g by mouth daily as needed.  . predniSONE (DELTASONE) 10 MG tablet Take 10 mg by mouth daily with breakfast.  . psyllium (METAMUCIL SMOOTH TEXTURE) 28 % packet Take 1 packet  by mouth daily as needed. Mix with 8 oz liquid  . senna-docusate (SENOKOT-S) 8.6-50 MG tablet Take 1 tablet by mouth at bedtime as needed for mild constipation.  Marland Kitchen umeclidinium-vilanterol (ANORO ELLIPTA) 62.5-25 MCG/INH AEPB Inhale 1 puff into the lungs daily.   . vitamin B-12 (CYANOCOBALAMIN) 500 MCG tablet Take 500 mcg by mouth. Three days a week   No facility-administered encounter medications on file as of 07/27/2019.   Review of systems.  In general is not complaining of  any fever chills continues to have some weakness.  Skin does have easy bruising he does have wrapping of his left forearm does not complain of rashes or itching or diaphoresis.  Head ears eyes nose mouth and throat does have hearing loss-does have hearing aids.  Does not really complain of visual changes today.  Does have a history of macular degeneration and blurriness at times.  Respiratory is not complain of shortness of breath-says he has a somewhat chronic cough but this has not gotten any more frequent  Cardiac does have chronic leg edema does not complain of chest pain.  GI does not complain of abdominal discomfort nausea vomiting diarrhea constipation.  GU she is not really complaining of dysuria today.  Does have a history of overactive bladder as well as prostate cancer.  Musculoskeletal does have weakness and a history of somewhat chronic back pain does not complain of acute pain at this time.  Neurologic is not complaining of feeling dizzy or having a headache or feeling syncopal.  And psych continues to be in good spirits does not really complain of being depressed or anxious at this point.    Physical exam.  Temperature is 97.3 pulse 62 respiration 17 blood pressure 115/56.  In general this is a somewhat frail-appearing elderly [name] [name] in no distress he is bright and alert.  His skin is warm and dry he does appear to have some chronic bruising and does have wrapping of his left lower arm.  Eyes visual acuity appears to be grossly intact sclera and conjunctive are clear.  Oropharynx is clear mucous membranes moist.  Chest is clear to auscultation cannot really appreciate any significant congestion there is no labored breathing.  Heart is regular rate and rhythm with somewhat distant heart sounds he has mild lower extremity edema this appears relatively unchanged from previous exam. I would estimate to be about 2+.  Abdomen is soft nontender with positive bowel  sounds.  Musculoskeletal Limited exam since he was in bed but appears able to move all extremities x4 with some lower extremity weakness.  Neurologic as noted above cannot really appreciate lateralizing findings his speech is clear.  Psych he continues to be grossly alert and oriented apparently does have some mild cognitive deficits continues to be very pleasant and conversant.  Labs.  July 31, 2019.  WBC 7.0 hemoglobin 11.4 platelets 168.  Sodium 137 potassium 4 BUN 21.4 creatinine 1.06  Recent Labs (within last 365 days)  Recent Labs    07/23/19 0322 07/23/19 0322 07/24/19 0350 07/24/19 1540 07/25/19 0321  NA 136   < > 139 139 140  K 4.5   < > 4.9 4.5 4.3  CL 96*   < > 100 102 102  CO2 32   < > 29 31 30   GLUCOSE 105*   < > 87 135* 87  BUN 50*   < > 47* 44* [name]*  CREATININE 1.[name]*   < > 1.39* 1.23 1.35*  CALCIUM 8.3*   < >  8.4* 8.0* 7.9*  MG 2.1  --  2.1  --  2.0   < > = values in this interval not displayed.     Liver Function Tests: Recent Labs (within last 365 days)      Recent Labs    06/27/19 1221 07/10/19 1620  AST  --  38  ALT  --  30  ALKPHOS  --  81  BILITOT  --  1.9*  PROT 5.8* 5.7*  ALBUMIN  --  3.4*     Recent Labs (within last 365 days)  No results for input(s): LIPASE, AMYLASE in the last 8760 hours.   Recent Labs (within last 365 days)  No results for input(s): AMMONIA in the last 8760 hours.   CBC: Recent Labs (within last 365 days)             Recent Labs    07/15/19 0303 07/15/19 0303 07/16/19 0234 07/16/19 0234 07/19/19 1411 07/20/19 0405 07/23/19 0322 07/24/19 0350 07/25/19 0321  WBC 10.3   < > 9.3   < > 8.5   < > 7.8 7.5 6.6  NEUTROABS 9.0*  --  8.5*  --  7.6  --   --   --   --   HGB 13.1   < > 11.9*   < > 12.6*   < > 11.3* 11.1* 10.4*  HCT 43.1   < > 39.0   < > 40.2   < > 36.4* 36.6* 33.9*  MCV 109.4*   < > 111.1*   < > 108.1*   < > 107.7* 111.2* 109.0*  PLT 136*   < > 115*   < > 142*   < > 149* 158 147*   < > =  values in this interval not displayed.     Cardiac Enzymes: Recent Labs (within last 365 days)     Recent Labs    06/27/19 1221  CKTOTAL 51       Assessment and plan.  1.  History of acute on chronic systolic CHF with ejection fraction 15 to 20%-at this point appears to be stable on current dose of Lasix 20 mg a day he continues on Toprol XL 25 mg a day as well as losartan 12.5 mg a day.  Continues to be vulnerable in this regards it is suspected possible reduced ejection fraction was at least partially due to his rapid A. fib at the time.  At this point continue to monitor-update weight apparently is pending.  He is not complaining of any increased shortness of breath and his cough he says is chronic and not any worse.  2.  History of chronic kidney disease-his renal function appears to be stable with a creatinine of 1.06 BUN of 21.4 on lab done June 15 will have this updated next week as well.  I did discuss his labs with his son via phone.  3.  History of emphysema-he continues on Anoro Ellipta as well as prednisone-continues with somewhat of a chronic cough he says this is not any worse does not really complain of shortness of breath-at this point will monitor.  4.  History of iron deficiency anemia-she is on iron supplementation most recent labs showed hemoglobin 11.4 which appears to be relatively stable-appears there has been some variation between 13--- the low 11's  #5 history of atrial fibrillation this appears rate controlled on Toprol-XL 25 mg a day as well as amiodarone 200 mg twice daily he is on Eliquis 2.5 mg twice  daily for anticoagulation   [phone]

## 2019-08-01 NOTE — Patient Outreach (Signed)
Screened for potential [facility] Care Management needs as a benefit of  [employer].  Mr. Wishon is currently receiving skilled therapy at [facility].  Writer attended telephonic interdisciplinary team meeting to assess for disposition needs and transition plan for resident.   Facility reports care plan meeting with family revealed transition plan is [facility] where the wife is. Currently mod/max assist with therapy.  Will continue to follow for transition plans and assess for potential [facility] needs while member resides in SNF.    Marthenia Rolling, MSN-Ed, RN,BSN [facility] Acute Care Coordinator [phone] [facility]) [phone]  (Toll free office)

## 2019-08-02 ENCOUNTER — Ambulatory Visit: Payer: Medicare Other | Admitting: Physician Assistant

## 2019-08-04 ENCOUNTER — Encounter: Payer: Self-pay | Admitting: Internal Medicine

## 2019-08-06 ENCOUNTER — Non-Acute Institutional Stay (SKILLED_NURSING_FACILITY): Payer: Medicare Other | Admitting: Internal Medicine

## 2019-08-06 DIAGNOSIS — R05 Cough: Secondary | ICD-10-CM | POA: Diagnosis not present

## 2019-08-06 DIAGNOSIS — I5041 Acute combined systolic (congestive) and diastolic (congestive) heart failure: Secondary | ICD-10-CM

## 2019-08-06 DIAGNOSIS — R059 Cough, unspecified: Secondary | ICD-10-CM

## 2019-08-06 DIAGNOSIS — I4891 Unspecified atrial fibrillation: Secondary | ICD-10-CM

## 2019-08-06 NOTE — Progress Notes (Signed)
This is an acute visit.  Level of care skilled.  [facility]  Chief complaint is acute visit follow-up complaints of a dry cough.  History of present illness.  Patient is a very pleasant 86 year old male who is here for rehab after hospitalization for CHF exacerbation along with rapid ventricular rate atrial fibrillation.  Has a history of A. fib with pacemaker as well as chronic kidney disease COPD in addition to prostate cancer status post radiation.  Also has a history of anemia of chronic disease as well as neuropathy-degenerative disc disease-scoliosis and history of DVT and macular degeneration as well as lumbar spinal stenosis.  He presented to hospital with lower extremity edema and shortness of breath and was found to be in suspected CHF exacerbation.  He also had A. fib with rapid ventricular rate.  Initially his amiodarone and beta-blocker were held but he has been restarted on both he is on amiodarone 200 mg twice a day and Toprol-XL 25 mg a day.  Because of his advanced age his Eliquis dose was reduced.  He had a successful cardioversion on June 8.  And did respond to diuretic for his CHF.  He continues on Lasix 20 mg a day.  Overall he appears to be doing well does not have any acute complaints-but today is stating that he does have a dry cough.-And would like something for this if possible-  He is not complaining of any increased shortness of breath-and says this cough has been present for a while even during his hospitalization.  He is not complaining of any fever or chills.  Do note he does have a history of COPD and continues on Anoro Ellipta as well as prednisone  Regards to CHF this appears to be stable and update weight is pending edema appears to be baseline.  Oxygen saturations are satisfactory in the 90s  Past Medical History:  Diagnosis Date  . Abdominal hernia   . Abnormality of gait    due to low back pain , uses cane and  walker  . Anemia of chronic disease    Dr. Alen Blew  . Arthritis   . Bilateral lower extremity edema    wears TED hose  . Cancer [facility])    prostate- treated with radiation  . Cardiac pacemaker in situ 10/11/2006   medtronic  . Chronic iron deficiency anemia oncologist/ hematoloist-- dr Alen Blew   treatment IV Iron infusions  . Chronic low back pain   . CKD (chronic kidney disease), stage III   . COPD with emphysema [facility])    pulmologist- dr Elsworth Soho  . DDD (degenerative disc disease), lumbosacral   . Degenerative scoliosis   . Dyspnea    with exertion  . Full dentures   . GERD (gastroesophageal reflux disease)   . Gross hematuria   . Headache    history of migraines- "way in the past"  . Hereditary and idiopathic peripheral neuropathy    bilateral lower leg  . Hiatal hernia   . History of DVT of lower extremity 10/19/2011   chronic distal popliteal right lower extremity  . History of external beam radiation therapy 2008   prostate cancer  . History of pancreatitis 2013  . History of pneumothorax 09/2006   iatrogenic-- in setting cardiac pacemaker placement  . History of prostate cancer 2008   s/p external radiation therapy  . Hypertension   . Injury of left ulnar nerve 06/2015   Dr. Jannifer Franklin   . Left carpal tunnel syndrome   .  Lesion of bladder   . Macular degeneration of both eyes   . Macular degeneration of right eye   . Mild mitral regurgitation   . Mild mitral regurgitation    and trace AI 07/22/08 echo and 10/2010  . Mild pulmonary hypertension ([name])   . Nocturia   . OSA on CPAP   . PAF (paroxysmal atrial fibrillation) ([place]) 10/2011   1st episode documented 09/ 2013 admission for gallstons/ pancreatitis and prior to cholecystectomy , went back in NSR without interventeion  . Pancreatitis 10/2011   gallstone induced  . Presence of permanent cardiac pacemaker   . Prostate cancer ([place])    Dr. Karsten Ro  . Sinus node dysfunction  ([facility])    s/p cardiac pacemaker placement 10-11-2006  . Sinus node dysfunction ([facility])    pacemaker  . Spinal stenosis    L2  . Venous insufficiency   . Wears glasses   . Wears hearing aid in both ears         Past Surgical History:  Procedure Laterality Date  . BALLOON DILATION N/A 01/14/2015   Procedure: BALLOON DILATION; Surgeon: Garlan Fair, MD; Location: [facility]; Service: Endoscopy; Laterality: N/A;  . BALLOON DILATION N/A 10/12/2017   Procedure: BALLOON DILATION; Surgeon: Otis Brace, MD; Location: [place] [facility]; Service: Gastroenterology; Laterality: N/A;  . BIOPSY  10/12/2017   Procedure: BIOPSY; Surgeon: Otis Brace, MD; Location: [facility]; Service: Gastroenterology;;  . CARDIAC CATHETERIZATION  08-29-2000 [place]   no significant obstructive CAD, intact globle LV size and systolic function with regional wall motion abnormalities noted, ?coronary spasm producing wall motion abnormality, elevated CPK and chest pain (ef 55%)  . CARDIAC PACEMAKER PLACEMENT  10-11-2006 dr Leonia Reeves   W/ ATRIAL LEAD REVISION 10-12-2006 (medtronic)  . CARDIOVASCULAR STRESS TEST  11-18-2011 dr Irish Lack   Low risk nuclear study w/ no ishemia/ normal wall motion, post-stress ef 48% (LVEF 56% on prior study 2008)  . CARDIOVERSION N/A 04/02/2019   Procedure: CARDIOVERSION; Surgeon: Josue Hector, MD; Location: [facility]; Service: Cardiovascular; Laterality: N/A;  . CARDIOVERSION N/A 07/24/2019   Procedure: CARDIOVERSION; Surgeon: Dorothy Spark, MD; Location: [facility]COPY; Service: Cardiovascular; Laterality: N/A;  . CARPAL TUNNEL RELEASE Left 11/14/2014   Procedure: CARPAL TUNNEL RELEASE LEFT THUMB; Surgeon: Daryll Brod, MD; Location: [place]; Service: Orthopedics; Laterality: Left; ANESTHESIA: IV REGIONAL FAB  . CATARACT EXTRACTION W/ INTRAOCULAR LENS IMPLANT, BILATERAL    . CHOLECYSTECTOMY   10/23/2011  . CHOLECYSTECTOMY  10/23/2011   Procedure: CHOLECYSTECTOMY; Surgeon: Rolm Bookbinder, MD; Location: [place]; Service: General; Laterality: N/A; with intraoperative cholangiogram  . COLONOSCOPY    . ESOPHAGOGASTRODUODENOSCOPY (EGD) WITH PROPOFOL N/A 01/14/2015   Procedure: ESOPHAGOGASTRODUODENOSCOPY (EGD) WITH PROPOFOL; Surgeon: Garlan Fair, MD; Location: [facility]; Service: Endoscopy; Laterality: N/A;  . ESOPHAGOGASTRODUODENOSCOPY (EGD) WITH PROPOFOL N/A 10/12/2017   Procedure: ESOPHAGOGASTRODUODENOSCOPY (EGD) WITH PROPOFOL; Surgeon: Otis Brace, MD; Location: [place]; Service: Gastroenterology; Laterality: N/A;  . I & D EXTREMITY Left 10/20/2012   Procedure: LEFT LEG IRRIGATION AND DEBRIDEMENT, AND WOUND VAC APPLICATION; Surgeon: Marianna Payment, MD; Location: [facility]; Service: Orthopedics; Laterality: Left;  . I & D EXTREMITY Left 10/22/2012   Procedure: IRRIGATION AND DEBRIDEMENT EXTREMITY; Surgeon: Marianna Payment, MD; Location: [facility]; Service: Orthopedics; Laterality: Left;  . INGUINAL HERNIA REPAIR Left yrs ago  . LAPAROSCOPIC ASSISTED VENTRAL HERNIA REPAIR  08-08-2009 dr Excell Seltzer   AND OPEN REPAIR RECURRENT LEFT INGUINAL HERNIA  . LAPAROSCOPIC INGUINAL HERNIA REPAIR Left 07-24-1999 dr Excell Seltzer  .  OPEN VENTRAL HERNIA REPAIR / LYSIS ADHESIONS  09-23-2010 dr Donne Hazel  . ORIF ELBOW FRACTURE Left 03/29/2015   Procedure: LEFT ELBOW OPEN REDUCTION INTERNAL FIXATION (ORIF) DISTAL HUMERUS FRACTURE WITH OLECRANON OSTEOTOMY AND ULNAR NERVE RELEASE; Surgeon: Roseanne Kaufman, MD; Location: [place]; Service: Orthopedics; Laterality: Left;  . PPM GENERATOR CHANGEOUT N/A 02/10/2018   Procedure: PPM GENERATOR CHANGEOUT; Surgeon: Deboraha Sprang, MD; Location: [place] CV LAB; Service: Cardiovascular; Laterality: N/A;  . REPAIR SUPRAUMBILICAL HERNIA   [id number] dr Excell Seltzer  . SHOULDER ARTHROSCOPY WITH DISTAL CLAVICLE  RESECTION Right 11-07-2007 dr Rhona Raider [place] ACROMIOPLASTY  . SKIN SPLIT GRAFT Left 10/22/2012   Procedure: SKIN GRAFT SPLIT THICKNESS; Surgeon: Marianna Payment, MD; Location: [facility]; Service: Orthopedics; Laterality: Left;  . TEE WITHOUT CARDIOVERSION N/A 07/24/2019   Procedure: TRANSESOPHAGEAL ECHOCARDIOGRAM (TEE); Surgeon: Dorothy Spark, MD; Location: [facility] ENDOSCOPY; Service: Cardiovascular; Laterality: N/A;  . TOTAL KNEE ARTHROPLASTY Right 1990's  . TRANSTHORACIC ECHOCARDIOGRAM  06-12-2012 dr Irish Lack   ef 50-55%/ mild LAE/ mild MR and TR/ AV sclerosis without stenosis/ RVSP 20mmHg  . TRANSURETHRAL RESECTION OF BLADDER TUMOR N/A 07/15/2017   Procedure: CYSTOSCOPY TRANSURETHRAL RESECTION OF BLADDER TUMOR (TURBT); Surgeon: Kathie Rhodes, MD; Location: [facility]; Service: Urology; Laterality: N/A;    Social History        Socioeconomic History  . Marital status: Married    Spouse name: martha  . Number of children: 3  . Years of education: 74  . Highest education level: 12th grade  Occupational History  . 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 Smoking status: Former Smoker    Packs/day: 1.50    Years: 35.00    Pack years: 52.50    Types: Cigarettes    Quit date: 02/15/1973    Years since quitting: 46.4  . Smokeless tobacco: Never Used  Vaping Use  . Vaping Use: Never used  Substance and Sexual Activity  . Alcohol use: No  . Drug use: No  . Sexual activity: Never  Other Topics Concern  . Not on file  Social History Narrative   Lives at home w/ his wife, [name]   Right-handed   Drinks about 1 soda per day, decaf coffee   Social Determinants of Health      Financial Resource Strain:   . Difficulty of Paying Living Expenses:   Food Insecurity:   . Worried About Charity fundraiser in the Last Year:   . Arboriculturist in  the Last Year:   Transportation Needs:   . Film/video editor (Medical):   Marland Kitchen Lack of Transportation (Non-Medical):   Physical Activity:   . Days of Exercise per Week:   . Minutes of Exercise per Session:   Stress:   . Feeling of Stress :   Social Connections:   . Frequency of Communication with Friends and Family:   . Frequency of Social Gatherings with Friends and Family:   . Attends Religious Services:   . Active Member of Clubs or Organizations:   . Attends Archivist Meetings:   Marland Kitchen Marital Status:   reports that he quit smoking about 46 years ago. His smoking use included cigarettes. He has a 52.50 pack-year smoking history. He has never used smokeless tobacco. He reports that he does not drink alcohol and does not use drugs.  Functional Status Survey:  Family History  Problem Relation Age of Onset  . Heart disease Father   . Heart disease Mother   . Colon cancer Mother   . Stroke Mother   . Cancer Sister   . Heart disease Sister   . Heart attack Neg Hx   . Hypertension Neg Hx         Health Maintenance  Topic Date Due  . INFLUENZA VACCINE  09/16/2019  . TETANUS/TDAP  11/29/2022  . COVID-19 Vaccine  Completed  . PNA vac Low Risk Adult  Completed    No Known Allergies      MEDICATIONS    Medication Sig  . acetaminophen (TYLENOL) 650 MG CR tablet Take 650 mg by mouth 2 (two) times daily.  Marland Kitchen amiodarone (PACERONE) 200 MG tablet Take 1 tablet (200 mg total) by mouth 2 (two) times daily.  Marland Kitchen apixaban (ELIQUIS) 2.5 MG TABS tablet Take 1 tablet (2.5 mg total) by mouth 2 (two) times daily.  . Calcium Carbonate-Vitamin D (CALCIUM-D PO) Take 1 tablet by mouth every evening.   . Cholecalciferol (VITAMIN D3 PO) Take 2,000 Units by mouth daily.  . diphenhydrAMINE (BENADRYL) 25 MG tablet Take 25 mg by mouth daily as needed for allergies.   . ferrous sulfate 325 (65 FE) MG tablet Take 650 mg by mouth daily with breakfast.  .  fluticasone (FLONASE) 50 MCG/ACT nasal spray Place 1 spray into both nostrils daily as needed for allergies or rhinitis.  . Folic Acid-Vit A3-FTD D22 (FOLBEE) 2.5-25-1 MG TABS tablet Take 1 tablet by mouth daily.  . furosemide (LASIX) 20 MG tablet Take 20 mg by mouth daily.   Marland Kitchen losartan (COZAAR) 25 MG tablet Take 0.5 tablets (12.5 mg total) by mouth daily.  . metoprolol succinate (TOPROL-XL) 25 MG 24 hr tablet Take 1 tablet (25 mg total) by mouth daily.  . mirabegron ER (MYRBETRIQ) 50 MG TB24 tablet Take 50 mg by mouth daily.  . Multiple Vitamins-Minerals (PRESERVISION AREDS 2 PO) Take 1 each by mouth in the morning and at bedtime.  Marland Kitchen oxyCODONE (OXY IR/ROXICODONE) 5 MG immediate release tablet Take 1 tablet (5 mg total) by mouth every 6 (six) hours as needed for severe pain.  Marland Kitchen oxymetazoline (AFRIN) 0.05 % nasal spray Place 1 spray into both nostrils 2 (two) times daily as needed for congestion.  . pantoprazole (PROTONIX) 40 MG tablet Take 1 tablet (40 mg total) by mouth daily before breakfast.  . polyethylene glycol (MIRALAX) 17 g packet Take 17 g by mouth daily as needed.  . predniSONE (DELTASONE) 10 MG tablet Take 10 mg by mouth daily with breakfast.  . psyllium (METAMUCIL SMOOTH TEXTURE) 28 % packet Take 1 packet by mouth daily as needed. Mix with 8 oz liquid  . senna-docusate (SENOKOT-S) 8.6-50 MG tablet Take 1 tablet by mouth at bedtime as needed for mild constipation.  Marland Kitchen umeclidinium-vilanterol (ANORO ELLIPTA) 62.5-25 MCG/INH AEPB Inhale 1 puff into the lungs daily.   . vitamin B-12 (CYANOCOBALAMIN) 500 MCG tablet Take 500 mcg by mouth. Three days a week   Review of systems.  In general is not complaining of any fever chills has continued weakness but appears to be stable in this regards.  Skin does not complain of rashes or itching does have fragile skin with easy bruising.  Head ears eyes nose mouth and throat is not complaining of visual changes or sore throat.  Respiratory is not  complain of being short of breath at this time does have a dry  cough-he says he wishes it was productive.  Cardiac does not complain of chest pain edema appears to be baseline.  GI is not complaining of abdominal discomfort nausea vomiting diarrhea constipation.  GU no complaints of dysuria.  Does have a history of overactive bladder.  Musculoskeletal continues to weakness does have somewhat chronic back pain but is not really complaining of discomfort at this time.  Neurologic is positive for weakness does not complain of dizziness headache or syncope.  And psych does not complain of being depressed or anxious.  .  Physical exam.  Temperature is 98.0 pulse 65 respiration 17 blood pressure 112/62. Oxygen saturation is 95%   In general this is a very pleasant elderly male in no distress lying comfortably in bed.  His skin is warm and dry he does have evidence of chronic bruising. Eyes visual acuity appears to be intact sclera and conjunctive are clear.  Oropharynx is clear mucous membranes appear fairly moist.  Chest is clear to auscultation there is no labored breathing.  Heart is regular rate and rhythm with occasional irregular beats he has moderate lower extremity edema this is more prominent in his feet I would say edema if anything is improved from previous exam.  Abdomen is soft nontender with positive bowel sounds.  Musculoskeletal--Limited exam since he is in bed but appears able to move all extremities x4 at his baseline with some lower extremity weakness.  Neurologic as noted above cannot appreciate lateralizing findings speech is clear.  Psych he is pleasant and appropriate has some mild cognitive deficits but does quite well   Labs.  July 31, 2019.  WBC 7.0 hemoglobin 11.4 platelets 168.  Sodium 137 potassium 4 BUN 21.4 creatinine 1.06.  Recent Labs   07/23/19 0322 07/23/19 0322 07/24/19 0350 07/24/19 1540 07/25/19 0321  NA 136 <> 139 139 140   K 4.5 <> 4.9 4.5 4.3  CL 96* <> 100 102 102  CO2 32 <> 29 31 30   GLUCOSE 105* <> 87 135* 87  BUN 50* <> 47* 44* 42*  CREATININE 1.42* <> 1.39* 1.23 1.35*  CALCIUM 8.3* <> 8.4* 8.0* 7.9*  MG 2.1 --  2.1 --  2.0  <>= values in this interval not displayed.     Liver Function Tests: Recent Labs (within last 365 days)      Recent Labs   06/27/19 1221 07/10/19 1620  AST --  38  ALT --  30  ALKPHOS --  81  BILITOT --  1.9*  PROT 5.8* 5.7*  ALBUMIN --  3.4*     Recent Labs (within last 365 days)  No results for input(s): LIPASE, AMYLASE in the last 8760 hours.   Recent Labs (within last 365 days)  No results for input(s): AMMONIA in the last 8760 hours.   CBC:    Recent Labs (within last 365 days)              Recent Labs   07/15/19 0303 07/15/19 0303 07/16/19 0234 07/16/19 0234 07/19/19 1411 07/20/19 0405 07/23/19 0322 07/24/19 0350 07/25/19 0321  WBC 10.3 <> 9.3 <> 8.5 <> 7.8 7.5 6.6  NEUTROABS 9.0* --  8.5* --  7.6 --  --  --  --   HGB 13.1 <> 11.9* <> 12.6* <> 11.3* 11.1* 10.4*  HCT 43.1 <> 39.0 <> 40.2 <> 36.4* 36.6* 33.9*  MCV 109.4* <> 111.1* <> 108.1* <> 107.7* 111.2* 109.0*  PLT 136* <> 115* <> 142* <> 149* 158 147*  <>=  values in this interval not displayed.     Cardiac Enzymes: Recent Labs (within last 365 days)     Recent Labs   06/27/19 1221  CKTOTAL 51      Assessment and plan.  1.  Dry cough-this appears to have some chronicity to it-he does not really describe this as acute-he is not complaining of any increased shortness of breath fever chills-cannot really appreciate significant chest congestion.  At this point will prescribe Mucinex 600 mg twice daily for 5 days to see if this will help with production-continue to monitor closely with vital signs and pulse ox all of these are reassuring currently.  I do note he has a history  of COPD which leaves and believe there is some chronicity to the cough which is COPD related-he does continue on Anoro Ellipta as well as prednisone  2.  History of CHF he is on Lasix 20 mg a day as well as Toprol-XL 25 mg a day-this appears to be stable edema appears to be controlled-update weight is pending this will need to be watched-he does not describe any chest pain or increasing shortness of breath.  3.  Atrial fibrillation this appears to be rate controlled on Toprol he is on Eliquis 2.5 mg twice daily this was reduced during his hospitalization.  4.  History of chronic kidney disease we have been watching this especially since he is on Lasix this appears to be stable we have ordered update labs for later this week.  [id number]

## 2019-08-07 ENCOUNTER — Encounter: Payer: Self-pay | Admitting: Internal Medicine

## 2019-08-08 ENCOUNTER — Other Ambulatory Visit: Payer: Self-pay | Admitting: Internal Medicine

## 2019-08-08 ENCOUNTER — Other Ambulatory Visit: Payer: Self-pay | Admitting: *Deleted

## 2019-08-08 LAB — BASIC METABOLIC PANEL
BUN: 23 — AB (ref 4–21)
CO2: 25 — AB (ref 13–22)
Chloride: 103 (ref 99–108)
Creatinine: 0.9 (ref 0.6–1.3)
Glucose: 91
Potassium: 3.4 (ref 3.4–5.3)
Sodium: 137 (ref 137–147)

## 2019-08-08 LAB — CBC AND DIFFERENTIAL
HCT: 30 — AB (ref 41–53)
Hemoglobin: 10.1 — AB (ref 13.5–17.5)
Platelets: 112 — AB (ref 150–399)
WBC: 7.2

## 2019-08-08 LAB — CBC: RBC: 3.03 — AB (ref 3.87–5.11)

## 2019-08-08 LAB — COMPREHENSIVE METABOLIC PANEL: Calcium: 8.3 — AB (ref 8.7–10.7)

## 2019-08-08 MED ORDER — OXYCODONE HCL 5 MG PO TABS: 5.0000 mg | ORAL_TABLET | Freq: Four times a day (QID) | ORAL | 0 refills | Status: DC | PRN

## 2019-08-08 NOTE — Patient Outreach (Signed)
Screened for potential [facility] Care Management needs as a benefit of  [employer].  Mr. Ishler is receiving skilled therapy at [facility].  Writer attended telephonic interdisciplinary team meeting to assess for disposition needs and transition plan for resident.   Facility reports transition plan is for ILF with assistance not ALF. Member's spouse is in [place]. Member could benefit from higher level than ILF post SNF. He is mod/max with therapy.  Will continue to follow.  Marthenia Rolling, MSN-Ed, RN,BSN [place] Acute Care Coordinator [phone] [facility]) [phone]  (Toll free office)

## 2019-08-09 ENCOUNTER — Encounter: Payer: Self-pay | Admitting: Family Medicine

## 2019-08-09 ENCOUNTER — Encounter: Payer: Self-pay | Admitting: Family

## 2019-08-09 ENCOUNTER — Non-Acute Institutional Stay (SKILLED_NURSING_FACILITY): Payer: Medicare Other | Admitting: Family

## 2019-08-09 DIAGNOSIS — K5901 Slow transit constipation: Secondary | ICD-10-CM | POA: Diagnosis not present

## 2019-08-09 DIAGNOSIS — E876 Hypokalemia: Secondary | ICD-10-CM

## 2019-08-09 DIAGNOSIS — D509 Iron deficiency anemia, unspecified: Secondary | ICD-10-CM | POA: Diagnosis not present

## 2019-08-09 DIAGNOSIS — T148XXA Other injury of unspecified body region, initial encounter: Secondary | ICD-10-CM | POA: Diagnosis not present

## 2019-08-09 DIAGNOSIS — T502X5A Adverse effect of carbonic-anhydrase inhibitors, benzothiadiazides and other diuretics, initial encounter: Secondary | ICD-10-CM

## 2019-08-09 NOTE — Progress Notes (Signed)
Location:  [place] Room Number: [place] of Service:  SNF [phone]) Provider:  Lawerance Cruel, MD  Patient Care Team: Lawerance Cruel, MD as PCP - General ([place]) Jettie Booze, MD as PCP - Cardiology (Cardiology) Deboraha Sprang, MD as PCP - Electrophysiology (Cardiology) Wyatt Portela, MD (Hematology and Oncology) Kathie Rhodes, MD as Attending Physician (Urology)  Extended Emergency Contact Information Primary Emergency Contact: Crepeau,[name] Address: [place], [place] [address] [name] of [place] Phone: [phone] Mobile Phone: [phone] Relation: Son Secondary Emergency Contact: Yeudiel, Mateo Mobile Phone: [phone] Relation: Son  Code Status:  DNR  Goals of care: Advanced Directive information Advanced Directives 08/09/2019  Does Patient Have a Medical Advance Directive? -  Type of Advance Directive Out of facility DNR (pink MOST or yellow form)  Does patient want to make changes to medical advance directive? No - Patient declined  Copy of [place] in Chart? -  Would patient like information on creating a medical advance directive? -  Pre-existing out of facility DNR order (yellow form or pink MOST form) Pink MOST/Yellow Form most recent copy in chart - Physician notified to receive inpatient order     Chief Complaint  Patient presents with  . Acute Visit    Abnormal labs and low potassium     HPI:  Pt is a 86 y.o. male seen today for an acute visit for abnormal lab results.He is seen in his room up in bed.He denies any acute issues.He states lost his reading eye glasses.Facility Nurse supervisor aware.His recent labs Hgb 10.1,HCT 29.8( previous Hgb 10.4.His potassium level  3.4 currently on Furosemide 20 mg tablet daily for congestive heart failure.denies any cough,wheezing or shortness of breath.He states swelling on the legs persist. He has had  no abrupt weight gain.    Past Medical History:  Diagnosis Date  . Abdominal hernia   . Abnormality of gait    due to low back pain , uses  cane and walker  . Anemia of chronic disease    Dr. Alen Blew  . Arthritis   . Bilateral lower extremity edema    wears TED hose  . Cancer [facility])    prostate- treated with radiation  . Cardiac pacemaker in situ 10/11/2006   medtronic  . Chronic iron deficiency anemia oncologist/ hematoloist-- dr Alen Blew   treatment IV Iron infusions  . Chronic low back pain   . CKD (chronic kidney disease), stage III   . COPD with emphysema [facility])    pulmologist-  dr Elsworth Soho  . DDD (degenerative disc disease), lumbosacral   . Degenerative scoliosis   . Dyspnea    with exertion  . Full dentures   . GERD (gastroesophageal reflux disease)   . Gross hematuria   . Headache    history of migraines- "way in the past"  . Hereditary and idiopathic peripheral neuropathy    bilateral lower leg  . Hiatal hernia   . History of DVT of lower extremity 10/19/2011   chronic distal popliteal right lower extremity  . History of external beam radiation therapy 2008   prostate cancer  . History of pancreatitis 2013  . History of pneumothorax 09/2006   iatrogenic-- in setting cardiac pacemaker placement  . History of prostate cancer 2008   s/p  external radiation therapy  . Hypertension   . Injury of left ulnar nerve 06/2015  Dr. Jannifer Franklin   . Left carpal tunnel syndrome   . Lesion of bladder   . Macular degeneration of both eyes   . Macular degeneration of right eye   . Mild mitral regurgitation   . Mild mitral regurgitation    and trace AI 07/22/08 echo and 10/2010  . Mild pulmonary hypertension ([place])   . Nocturia   . OSA on CPAP   . PAF (paroxysmal atrial fibrillation) ([place]) 10/2011   1st episode documented 09/ 2013 admission for gallstons/ pancreatitis and prior to cholecystectomy , went back in NSR without interventeion  . Pancreatitis 10/2011   gallstone induced  .  Presence of permanent cardiac pacemaker   . Prostate cancer ([place])    Dr. Karsten Ro  . Sinus node dysfunction (HCC)    s/p  cardiac pacemaker placement 10-11-2006  . Sinus node dysfunction (H[facility])    pacemaker  . Spinal stenosis    L2  . Venous insufficiency   . Wears glasses   . Wears hearing aid in both ears    Past Surgical History:  Procedure Laterality Date  . BALLOON DILATION N/A 01/14/2015   Procedure: BALLOON DILATION;  Surgeon: Garlan Fair, MD;  Location: [facility];  Service: Endoscopy;  Laterality: N/A;  . BALLOON DILATION N/A 10/12/2017   Procedure: BALLOON DILATION;  Surgeon: Otis Brace, MD;  Location: [facility] ENDOSCOPY;  Service: Gastroenterology;  Laterality: N/A;  . BIOPSY  10/12/2017   Procedure: BIOPSY;  Surgeon: Otis Brace, MD;  Location: [place] ENDOSCOPY;  Service: Gastroenterology;;  . CARDIAC CATHETERIZATION  08-29-2000   Start   no significant obstructive CAD, intact globle LV size and systolic function with regional wall motion abnormalities noted,  ?coronary spasm producing wall motion abnormality, elevated CPK and chest pain (ef 55%)  . CARDIAC PACEMAKER PLACEMENT  10-11-2006    dr Leonia Reeves   W/  ATRIAL LEAD REVISION 10-12-2006   (medtronic)  . CARDIOVASCULAR STRESS TEST  11-18-2011    dr Irish Lack   Low risk nuclear study w/ no ishemia/  normal wall motion,  post-stress ef 48% (LVEF 56% on prior study 2008)  . CARDIOVERSION N/A 04/02/2019   Procedure: CARDIOVERSION;  Surgeon: Josue Hector, MD;  Location: [facility] END[facility]COPY;  Service: Cardiovascular;  Laterality: N/A;  . CARDIOVERSION N/A 07/24/2019   Procedure: CARDIOVERSION;  Surgeon: Dorothy Spark, MD;  Location: [facility] ENDOSCOPY;  Service: Cardiovascular;  Laterality: N/A;  . CARPAL TUNNEL RELEASE Left 11/14/2014   Procedure: CARPAL TUNNEL RELEASE LEFT THUMB;  Surgeon: Daryll Brod, MD;  Location: [place];  Service: Orthopedics;  Laterality: Left;  ANESTHESIA:  IV REGIONAL FAB  . CATARACT  EXTRACTION W/ INTRAOCULAR LENS  IMPLANT, BILATERAL    . CHOLECYSTECTOMY  10/23/2011  . CHOLECYSTECTOMY  10/23/2011   Procedure: CHOLECYSTECTOMY;  Surgeon: Rolm Bookbinder, MD;  Location: [place];  Service: General;  Laterality: N/A;  with intraoperative cholangiogram  . COLONOSCOPY    . ESOPHAGOGASTRODUODENOSCOPY (EGD) WITH PROPOFOL N/A 01/14/2015   Procedure: ESOPHAGOGASTRODUODENOSCOPY (EGD) WITH PROPOFOL;  Surgeon: Garlan Fair, MD;  Location: [facility] ENDOSCOPY;  Service: Endoscopy;  Laterality: N/A;  . ESOPHAGOGASTRODUODENOSCOPY (EGD) WITH PROPOFOL N/A 10/12/2017   Procedure: ESOPHAGOGASTRODUODENOSCOPY (EGD) WITH PROPOFOL;  Surgeon: Otis Brace, MD;  Location: [facility];  Service: Gastroenterology;  Laterality: N/A;  . I & D EXTREMITY Left 10/20/2012   Procedure: LEFT LEG IRRIGATION AND DEBRIDEMENT, AND WOUND VAC APPLICATION;  Surgeon: Marianna Payment, MD;  Location: [facility] OR[facility];  Service: Orthopedics;  Laterality: Left;  .  I & D EXTREMITY Left 10/22/2012   Procedure: IRRIGATION AND DEBRIDEMENT EXTREMITY;  Surgeon: Marianna Payment, MD;  Location: [facility];  Service: Orthopedics;  Laterality: Left;  . INGUINAL HERNIA REPAIR Left yrs ago  . LAPAROSCOPIC ASSISTED VENTRAL HERNIA REPAIR  08-08-2009   dr Excell Seltzer   AND OPEN REPAIR RECURRENT LEFT INGUINAL HERNIA  . LAPAROSCOPIC INGUINAL HERNIA REPAIR Left 07-24-1999    dr Excell Seltzer  . OPEN VENTRAL HERNIA REPAIR /  LYSIS ADHESIONS  09-23-2010    dr Donne Hazel  . ORIF ELBOW FRACTURE Left 03/29/2015   Procedure: LEFT ELBOW OPEN REDUCTION INTERNAL FIXATION (ORIF) DISTAL HUMERUS FRACTURE WITH OLECRANON OSTEOTOMY AND ULNAR NERVE RELEASE;  Surgeon: Roseanne Kaufman, MD;  Location: [place];  Service: Orthopedics;  Laterality: Left;  . PPM GENERATOR CHANGEOUT N/A 02/10/2018   Procedure: PPM GENERATOR CHANGEOUT;  Surgeon: Deboraha Sprang, MD;  Location: [place] [facility];  Service: Cardiovascular;  Laterality: N/A;  . REPAIR SUPRAUMBILICAL HERNIA   [phone]   dr  Excell Seltzer  . SHOULDER ARTHROSCOPY WITH DISTAL CLAVICLE RESECTION Right 11-07-2007   dr Rhona Raider   [place] ACROMIOPLASTY  . SKIN SPLIT GRAFT Left 10/22/2012   Procedure: SKIN GRAFT SPLIT THICKNESS;  Surgeon: Marianna Payment, MD;  Location: [facility];  Service: Orthopedics;  Laterality: Left;  . TEE WITHOUT CARDIOVERSION N/A 07/24/2019   Procedure: TRANSESOPHAGEAL ECHOCARDIOGRAM (TEE);  Surgeon: Dorothy Spark, MD;  Location: [facility] ENDOSCOPY;  Service: Cardiovascular;  Laterality: N/A;  . TOTAL KNEE ARTHROPLASTY Right 1990's  . TRANSTHORACIC ECHOCARDIOGRAM  06-12-2012   dr Irish Lack   ef 50-55%/ mild LAE/ mild MR and TR/  AV sclerosis without stenosis/  RVSP 3mmHg  . TRANSURETHRAL RESECTION OF BLADDER TUMOR N/A 07/15/2017   Procedure: CYSTOSCOPY TRANSURETHRAL RESECTION OF BLADDER TUMOR (TURBT);  Surgeon: Kathie Rhodes, MD;  Location: [facility];  Service: Urology;  Laterality: N/A;    No Known Allergies  Outpatient Encounter Medications as of 08/09/2019  Medication Sig  . acetaminophen (TYLENOL) 650 MG CR tablet Take 650 mg by mouth 2 (two) times daily.  Marland Kitchen amiodarone (PACERONE) 200 MG tablet Take 1 tablet (200 mg total) by mouth 2 (two) times daily.  Marland Kitchen apixaban (ELIQUIS) 2.5 MG TABS tablet Take 1 tablet (2.5 mg total) by mouth 2 (two) times daily.  . Calcium Carbonate-Vitamin D (CALCIUM-D PO) Take 1 tablet by mouth every evening.   . Cholecalciferol (VITAMIN D3 PO) Take 2,000 Units by mouth daily.  . ferrous sulfate 325 (65 FE) MG tablet Take 650 mg by mouth daily with breakfast.  . fluticasone (FLONASE) 50 MCG/ACT nasal spray Place 1 spray into both nostrils daily as needed for allergies or rhinitis.  . Folic Acid-Vit I1-WER X54 (FOLBEE) 2.5-25-1 MG TABS tablet Take 1 tablet by mouth daily.  . furosemide (LASIX) 20 MG tablet Take 20 mg by mouth daily.   Marland Kitchen losartan (COZAAR) 25 MG tablet Take 0.5 tablets (12.5 mg total) by mouth daily.  . metoprolol succinate  (TOPROL-XL) 25 MG 24 hr tablet Take 1 tablet (25 mg total) by mouth daily.  . mirabegron ER (MYRBETRIQ) 50 MG TB24 tablet Take 50 mg by mouth daily.  . Multiple Vitamins-Minerals (PRESERVISION AREDS 2 PO) Take 1 each by mouth in the morning and at bedtime.  Marland Kitchen oxyCODONE (OXY IR/ROXICODONE) 5 MG immediate release tablet Take 1 tablet (5 mg total) by mouth every 6 (six) hours as needed for severe pain.  Marland Kitchen oxymetazoline (AFRIN) 0.05 %  nasal spray Place 1 spray into both nostrils 2 (two) times daily as needed for congestion.  . pantoprazole (PROTONIX) 40 MG tablet Take 1 tablet (40 mg total) by mouth daily before breakfast.  . polyethylene glycol (MIRALAX) 17 g packet Take 17 g by mouth daily as needed.  . predniSONE (DELTASONE) 10 MG tablet Take 10 mg by mouth daily with breakfast.  . psyllium (METAMUCIL SMOOTH TEXTURE) 28 % packet Take 1 packet by mouth daily as needed. Mix with 8 oz liquid  . senna-docusate (SENOKOT-S) 8.6-50 MG tablet Take 1 tablet by mouth at bedtime as needed for mild constipation.  Marland Kitchen umeclidinium-vilanterol (ANORO ELLIPTA) 62.5-25 MCG/INH AEPB Inhale 1 puff into the lungs daily.   . vitamin B-12 (CYANOCOBALAMIN) 500 MCG tablet Take 500 mcg by mouth. Three days a week   No facility-administered encounter medications on file as of 08/09/2019.    Review of Systems  Constitutional: Negative for appetite change, chills, fatigue and fever.  HENT: Negative for congestion, rhinorrhea, sinus pressure, sinus pain, sneezing, sore throat and trouble swallowing.   Eyes: Negative for discharge, redness and itching.  Respiratory: Negative for cough, chest tightness, shortness of breath and wheezing.   Cardiovascular: Positive for leg swelling. Negative for chest pain and palpitations.  Gastrointestinal: Positive for constipation. Negative for abdominal distention, abdominal pain, diarrhea, nausea and vomiting.  Genitourinary:       Incontinent   Musculoskeletal: Positive for gait problem.   Skin: Negative for color change, pallor and rash.  Neurological: Negative for dizziness, speech difficulty, light-headedness, numbness and headaches.  Hematological: Bruises/bleeds easily.  Psychiatric/Behavioral: Negative for agitation, confusion and sleep disturbance. The patient is not nervous/anxious.     Immunization History  Administered Date(s) Administered  . Fluad Quad(high Dose 65+) 11/02/2018  . Influenza Split 11/02/2015  . Influenza, High Dose Seasonal PF 11/14/2016, 11/07/2017  . Influenza,inj,Quad PF,6+ Mos 10/16/2012  . Influenza-Unspecified 11/15/2013  . Moderna SARS-COVID-2 Vaccination 03/06/2019, 04/03/2019  . Pneumococcal Conjugate-13 06/03/2014  . Pneumococcal-Unspecified 05/04/2013  . Tdap 10/05/2012  . Zoster 02/16/2011  . Zoster Recombinat (Shingrix) 04/15/2017, 06/15/2017   Pertinent  Health Maintenance Due  Topic Date Due  . INFLUENZA VACCINE  09/16/2019  . PNA vac Low Risk Adult  Completed   Fall Risk  06/27/2019 12/30/2017  Falls in the past year? 1 1  Number falls in past yr: 1 1  Injury with Fall? 1 1  Comment bruises and skin damage -  Follow up - Falls evaluation completed    Vitals:   08/09/19 1019  BP: 127/74  Pulse: 65  Resp: 17  Temp: (!) 97.3 F (36.3 C)  Weight: 163 lb 6.4 oz (74.1 kg)  Height: 5\' 9"  (1.753 m)   Body mass index is 24.13 kg/m. Physical Exam Vitals reviewed.  Constitutional:      General: He is not in acute distress.    Appearance: He is not ill-appearing.  HENT:     Head: Normocephalic.     Ears:     Comments: Bilateral hearing aids in place  Eyes:     General: No scleral icterus.       Right eye: No discharge.        Left eye: No discharge.     Conjunctiva/sclera: Conjunctivae normal.     Pupils: Pupils are equal, round, and reactive to light.  Cardiovascular:     Rate and Rhythm: Normal rate and regular rhythm.     Pulses: Normal pulses.     Heart sounds:  Normal heart sounds. No murmur heard.  No  friction rub. No gallop.   Pulmonary:     Effort: Pulmonary effort is normal. No respiratory distress.     Breath sounds: Normal breath sounds. No wheezing, rhonchi or rales.  Chest:     Chest wall: No tenderness.  Abdominal:     General: Bowel sounds are normal. There is no distension.     Palpations: Abdomen is soft. There is no mass.     Tenderness: There is no abdominal tenderness. There is no right CVA tenderness, left CVA tenderness, guarding or rebound.  Skin:    General: Skin is warm.     Coloration: Skin is not pale.     Findings: Bruising present. No erythema or rash.     Comments: Skin tears to Right upper arm,right leg left arm dressing dry,clean and intact.old purple Bruises on forearms.No signs of infections.skin thin and fragile.  Neurological:     Mental Status: He is alert.     Motor: No weakness.     Gait: Gait abnormal.  Psychiatric:        Mood and Affect: Mood normal.        Behavior: Behavior normal.        Thought Content: Thought content normal.        Judgment: Judgment normal.    Labs reviewed: Recent Labs    07/23/19 0322 07/23/19 0322 07/24/19 0350 07/24/19 0350 07/24/19 1540 07/24/19 1540 07/25/19 0321 07/31/19 0000 08/08/19 0000  NA 136   < > 139   < > 139   < > 140 137 137  K 4.5   < > 4.9   < > 4.5   < > 4.3 4.0 3.4  CL 96*   < > 100   < > 102   < > 102 101 103  CO2 32   < > 29   < > 31   < > 30 22 25*  GLUCOSE 105*   < > 87  --  135*  --  87  --   --   BUN 50*   < > 47*   < > 44*   < > 42* 21 23*  CREATININE 1.42*   < > 1.39*   < > 1.23   < > 1.35* 1.1 0.9  CALCIUM 8.3*   < > 8.4*   < > 8.0*   < > 7.9* 8.7 8.3*  MG 2.1  --  2.1  --   --   --  2.0  --   --    < > = values in this interval not displayed.   Recent Labs    06/27/19 1221 07/10/19 1620  AST  --  38  ALT  --  30  ALKPHOS  --  81  BILITOT  --  1.9*  PROT 5.8* 5.7*  ALBUMIN  --  3.4*   Recent Labs    07/15/19 0303 07/15/19 0303 07/16/19 0234 07/16/19 0234  07/19/19 1411 07/20/19 0405 07/23/19 0322 07/23/19 0322 07/24/19 0350 07/24/19 0350 07/25/19 0321 07/31/19 0000 08/08/19 0000  WBC 10.3   < > 9.3   < > 8.5   < > 7.8   < > 7.5   < > 6.6 7.0 7.2  NEUTROABS 9.0*  --  8.5*  --  7.6  --   --   --   --   --   --   --   --   HGB 13.1   < >  11.9*   < > 12.6*   < > 11.3*   < > 11.1*   < > 10.4* 11.4* 10.1*  HCT 43.1   < > 39.0   < > 40.2   < > 36.4*   < > 36.6*   < > 33.9* 35* 30*  MCV 109.4*   < > 111.1*   < > 108.1*   < > 107.7*  --  111.2*  --  109.0*  --   --   PLT 136*   < > 115*   < > 142*   < > 149*   < > 158   < > 147* 168 112*   < > = values in this interval not displayed.   Lab Results  Component Value Date   TSH 1.512 07/11/2019    Significant Diagnostic Results in last 30 days:  CT Head Wo Contrast  Result Date: 07/10/2019 CLINICAL DATA:  Progressive weakness fall 3 weeks ago EXAM: CT HEAD WITHOUT CONTRAST TECHNIQUE: Contiguous axial images were obtained from the base of the skull through the vertex without intravenous contrast. COMPARISON:  CT brain 12/17/2017 FINDINGS: Brain: No acute territorial infarction, hemorrhage or intracranial mass. Moderate atrophy. Mild chronic small vessel ischemic change of the white matter. Stable ventricle size. Slight asymmetric enlargement of left convexity extra-axial CSF density potentially due to chronic subdural effusion. Vascular: No hyperdense vessels.  Carotid vascular calcification Skull: Normal. Negative for fracture or focal lesion. Sinuses/Orbits: Postsurgical changes of the right maxillary sinus. Mucosal thickening. Other: None IMPRESSION: 1. No CT evidence for acute intracranial abnormality. 2. Atrophy and chronic small vessel ischemic change of the white matter Electronically Signed   By: Donavan Foil M.D.   On: 07/10/2019 18:09   CT Cervical Spine Wo Contrast  Result Date: 07/10/2019 CLINICAL DATA:  Progressive weakness, history of fall 3 weeks ago EXAM: CT CERVICAL SPINE WITHOUT  CONTRAST TECHNIQUE: Multidetector CT imaging of the cervical spine was performed without intravenous contrast. Multiplanar CT image reconstructions were also generated. COMPARISON:  CT 12/17/2017 FINDINGS: Alignment: Trace retrolisthesis C4 on C5 and anterolisthesis C7 on T1 without change. Facet alignment is maintained Skull base and vertebrae: No acute fracture. No primary bone lesion or focal pathologic process. Soft tissues and spinal canal: No prevertebral fluid or swelling. No visible canal hematoma. Disc levels: Moderate severe degenerative changes throughout the cervical spine with multiple level disc space narrowing and osteophyte. Facet degenerative change at multiple levels with multiple level bilateral foraminal stenosis. Upper chest: Bilateral pleural effusions Other: None IMPRESSION: 1. Diffuse degenerative changes without acute fracture 2. Pleural effusions at the apices Electronically Signed   By: Donavan Foil M.D.   On: 07/10/2019 18:14   DG Chest Port 1 View  Result Date: 07/10/2019 CLINICAL DATA:  Generalized weakness, bilateral lower extremity edema EXAM: PORTABLE CHEST 1 VIEW COMPARISON:  06/20/2019 FINDINGS: Single frontal view of the chest demonstrates increased veiling opacities at the lung bases consistent with progressive consolidation and enlarging bilateral pleural effusions. Increased central vascular congestion. Cardiac silhouette is stable. No pneumothorax. Dual lead pacer unchanged. IMPRESSION: 1. Worsening volume status with progressive congestive heart failure. Electronically Signed   By: Randa Ngo M.D.   On: 07/10/2019 16:57   ECHOCARDIOGRAM COMPLETE  Result Date: 07/11/2019    ECHOCARDIOGRAM REPORT   Patient Name:   [name] [name] Date of Exam: 07/11/2019 Medical Rec #:  [id number]         Height:       70.0  in Accession #:    [id number]        Weight:       155.0 lb Date of Birth:  03/01/1924          BSA:          1.873 m Patient Age:    47 years          BP:            124/78 mmHg Patient Gender: M                 HR:           70 bpm. Exam Location:  Inpatient Procedure: 2D Echo Indications:    CHF 428.31  History:        Patient has prior history of Echocardiogram examinations, most                 recent 11/21/2012. Pacemaker, COPD; Risk Factors:Hypertension and                 Former Smoker. Pulmonary hypertension. sinus node dysfunction.  Sonographer:    Jannett Celestine RDCS (AE) Referring Phys: [id number] ALEXIS HUGELMEYER  Sonographer Comments: limited mobility IMPRESSIONS  1. Severely reduced LV function, 15-20%. LVOT VTI 8 cm. Left ventricular ejection fraction, by estimation, is 15-20%. The left ventricle has severely decreased function. The left ventricle demonstrates global hypokinesis. Left ventricular diastolic function could not be evaluated.  2. Right ventricular systolic function is moderately reduced. The right ventricular size is moderately enlarged. There is moderately elevated pulmonary artery systolic pressure. The estimated right ventricular systolic pressure is 01.7 mmHg.  3. Left atrial size was moderately dilated.  4. Right atrial size was moderately dilated.  5. The mitral valve is degenerative. Moderate to severe mitral valve regurgitation. No evidence of mitral stenosis.  6. The aortic valve is tricuspid and severely calcified with restricted movement in systole. The gradients are quite low given the severely reduced LV function (LVOT VTI 8 cm, SV=34 cc, SVi=18 cc/m2). AVA by VTI is 1.44 cm2 and DI is 0.34, consistent with moderate aortic stenosis. If there are concerns for severe aortic stenosis, would recommend an aortic valve calcium score for clarification. The aortic valve is tricuspid. Aortic valve regurgitation is not visualized. Moderate to severe aortic valve  stenosis. Aortic valve area, by VTI measures 1.44 cm. Aortic valve mean gradient measures 5.0 mmHg. Aortic valve Vmax measures 1.50 m/s. FINDINGS  Left Ventricle: Severely reduced LV  function, 15-20%. LVOT VTI 8 cm. Left ventricular ejection fraction, by estimation, is 15-20%. The left ventricle has severely decreased function. The left ventricle demonstrates global hypokinesis. The left ventricular internal cavity size was normal in size. There is no left ventricular hypertrophy. Left ventricular diastolic function could not be evaluated due to atrial fibrillation. Left ventricular diastolic function could not be evaluated. Right Ventricle: The right ventricular size is moderately enlarged. No increase in right ventricular wall thickness. Right ventricular systolic function is moderately reduced. There is moderately elevated pulmonary artery systolic pressure. The tricuspid  regurgitant velocity is 3.16 m/s, and with an assumed right atrial pressure of 10 mmHg, the estimated right ventricular systolic pressure is 51.0 mmHg. Left Atrium: Left atrial size was moderately dilated. Right Atrium: Right atrial size was moderately dilated. Pericardium: Trivial pericardial effusion is present. Mitral Valve: The mitral valve is degenerative in appearance. Mild mitral annular calcification. Moderate to severe mitral valve regurgitation. No evidence of mitral valve stenosis. Tricuspid Valve: The tricuspid valve  is grossly normal. Tricuspid valve regurgitation is mild . No evidence of tricuspid stenosis. Aortic Valve: The aortic valve is tricuspid and severely calcified with restricted movement in systole. The gradients are quite low given the severely reduced LV function (LVOT VTI 8 cm, SV=34 cc, SVi=18 cc/m2). AVA by VTI is 1.44 cm2 and DI is 0.34, consistent with moderate aortic stenosis. If there are concerns for severe aortic stenosis, would recommend an aortic valve calcium score for clarification. The aortic valve is tricuspid. Aortic valve regurgitation is not visualized. Moderate to severe aortic stenosis is present. Aortic valve mean gradient measures 5.0 mmHg. Aortic valve peak gradient measures  9.0 mmHg. Aortic valve area, by VTI measures 1.44 cm. Pulmonic Valve: The pulmonic valve was grossly normal. Pulmonic valve regurgitation is not visualized. No evidence of pulmonic stenosis. Aorta: The aortic root is normal in size and structure. IAS/Shunts: The atrial septum is grossly normal. Additional Comments: A pacer wire is visualized in the right atrium and right ventricle. There is a small pleural effusion in the left lateral region.  LEFT VENTRICLE PLAX 2D LVIDd:         5.20 cm LVIDs:         4.10 cm LV PW:         1.10 cm LV IVS:        0.90 cm LVOT diam:     2.30 cm LV SV:         34 LV SV Index:   18 LVOT Area:     4.15 cm  LEFT ATRIUM         Index LA diam:    4.60 cm 2.46 cm/m  AORTIC VALVE AV Area (Vmax):    1.27 cm AV Area (Vmean):   1.29 cm AV Area (VTI):     1.44 cm AV Vmax:           149.87 cm/s AV Vmean:          104.497 cm/s AV VTI:            0.238 m AV Peak Grad:      9.0 mmHg AV Mean Grad:      5.0 mmHg LVOT Vmax:         45.70 cm/s LVOT Vmean:        32.500 cm/s LVOT VTI:          0.082 m LVOT/AV VTI ratio: 0.35  AORTA Ao Root diam: 3.20 cm MITRAL VALVE               TRICUSPID VALVE MV Area (PHT): 2.11 cm    TR Peak grad:   39.9 mmHg MV Decel Time: 359 msec    TR Vmax:        316.00 cm/s MR Peak grad: 84.8 mmHg MR Mean grad: 50.0 mmHg    SHUNTS MR Vmax:      460.50 cm/s  Systemic VTI:  0.08 m MR Vmean:     329.0 cm/s   Systemic Diam: 2.30 cm MV E velocity: 56.10 cm/s Eleonore Chiquito MD Electronically signed by Eleonore Chiquito MD Signature Date/Time: 07/11/2019/4:04:21 PM    Final    ECHO TEE  Result Date: 07/24/2019    TRANSESOPHOGEAL ECHO REPORT   Patient Name:   [name] [name] Date of Exam: 07/24/2019 Medical Rec #:  [id number]         Height:       70.0 in Accession #:    [id number]  Weight:       161.6 lb Date of Birth:  23-Jun-1924          BSA:          1.906 m Patient Age:    55 years          BP:           109/57 mmHg Patient Gender: M                 HR:           75 bpm.  Exam Location:  Inpatient Procedure: Transesophageal Echo, Cardiac Doppler and Color Doppler Indications:    atrial fibrillation  History:        Patient has prior history of Echocardiogram examinations. CHF;                 Mitral Valve Disease.  Sonographer:    Clayton Lefort RDCS (AE) Referring Phys: [id number] ANGELA NICOLE DUKE PROCEDURE: The transesophogeal probe was passed without difficulty through the esophogus of the patient. Sedation performed by different physician. Image quality was good. The patient developed no complications during the procedure. The patient also received 6 mg of iv Etomidate for conscious sedation. IMPRESSIONS  1. No thrombus was seen in the left atrial appendage or left atrium.  2. Left ventricular ejection fraction, by estimation, is <20%. The left ventricle has severely decreased function. The left ventricle demonstrates global hypokinesis. The left ventricular internal cavity size was moderately dilated. Left ventricular diastolic function could not be evaluated.  3. Right ventricular systolic function is moderately reduced. The right ventricular size is moderately enlarged. There is moderately elevated pulmonary artery systolic pressure. The estimated right ventricular systolic pressure is 60.6 mmHg.  4. Low LAA filling and emptying velocities. Left atrial size was severely dilated. No left atrial/left atrial appendage thrombus was detected.  5. Right atrial size was moderately dilated.  6. The mitral valve is degenerative. Moderate to severe mitral valve regurgitation. No evidence of mitral stenosis.  7. The tricuspid valve is degenerative. Tricuspid valve regurgitation is moderate.  8. The aortic valve is tricuspid. Aortic valve regurgitation is trivial. Moderate aortic valve stenosis.  9. There is Moderate (Grade III) atheroma plaque involving the descending aorta. 10. Evidence of atrial level shunting detected by color flow Doppler. There is a small patent foramen ovale with  predominantly left to right shunting across the atrial septum. 11. The patient also received 6 mg of iv Etomidate for conscious sedation. Conclusion(s)/Recommendation(s): No LA/LAA thrombus identified. Successful cardioversion performed with restoration of normal sinus rhythm. FINDINGS  Left Ventricle: Left ventricular ejection fraction, by estimation, is <20%. The left ventricle has severely decreased function. The left ventricle demonstrates global hypokinesis. The left ventricular internal cavity size was moderately dilated. There is no left ventricular hypertrophy. Left ventricular diastolic function could not be evaluated. Right Ventricle: The right ventricular size is moderately enlarged. No increase in right ventricular wall thickness. Right ventricular systolic function is moderately reduced. There is moderately elevated pulmonary artery systolic pressure. The tricuspid  regurgitant velocity is 2.84 m/s, and with an assumed right atrial pressure of 8 mmHg, the estimated right ventricular systolic pressure is 30.1 mmHg. Left Atrium: Low LAA filling and emptying velocities. Left atrial size was severely dilated. No left atrial/left atrial appendage thrombus was detected. Right Atrium: Right atrial size was moderately dilated. Pericardium: Trivial pericardial effusion is present. Mitral Valve: The mitral valve is degenerative in appearance. Moderate to severe mitral valve regurgitation. No  evidence of mitral valve stenosis. Tricuspid Valve: The tricuspid valve is degenerative in appearance. Tricuspid valve regurgitation is moderate. Aortic Valve: The aortic valve is tricuspid. . There is severe thickening and severe calcifcation of the aortic valve. Aortic valve regurgitation is trivial. Moderate aortic stenosis is present. There is severe thickening of the aortic valve. There is severe calcifcation of the aortic valve. Pulmonic Valve: The pulmonic valve was normal in structure. Pulmonic valve regurgitation is  trivial. Aorta: The ascending aorta was not well visualized. There is moderate (Grade III) atheroma plaque involving the descending aorta. IAS/Shunts: Evidence of atrial level shunting detected by color flow Doppler. A small patent foramen ovale is detected with predominantly left to right shunting across the atrial septum. Additional Comments: A pacer wire is visualized.  TRICUSPID VALVE TR Peak grad:   32.3 mmHg TR Vmax:        284.00 cm/s Ena Dawley MD Electronically signed by Ena Dawley MD Signature Date/Time: 07/24/2019/12:30:43 PM    Final    VAS [place] LOWER EXTREMITY VENOUS (DVT)  Result Date: 07/12/2019  Lower Venous DVTStudy Indications: Swelling, and Edema.  Comparison Study: NO PRIOR Performing Technologist: Abram Sander RVS  Examination Guidelines: A complete evaluation includes B-mode imaging, spectral Doppler, color Doppler, and power Doppler as needed of all accessible portions of each vessel. Bilateral testing is considered an integral part of a complete examination. Limited examinations for reoccurring indications may be performed as noted. The reflux portion of the exam is performed with the patient in reverse Trendelenburg.  +---------+---------------+---------+-----------+----------+--------------+ RIGHT    CompressibilityPhasicitySpontaneityPropertiesThrombus Aging +---------+---------------+---------+-----------+----------+--------------+ CFV      Full           Yes      Yes                                 +---------+---------------+---------+-----------+----------+--------------+ SFJ      Full                                                        +---------+---------------+---------+-----------+----------+--------------+ FV Prox  Full                                                        +---------+---------------+---------+-----------+----------+--------------+ FV Mid   Full                                                         +---------+---------------+---------+-----------+----------+--------------+ FV DistalFull                                                        +---------+---------------+---------+-----------+----------+--------------+ PFV      Full                                                        +---------+---------------+---------+-----------+----------+--------------+  POP      Full           Yes      Yes                                 +---------+---------------+---------+-----------+----------+--------------+ PTV      Full                                                        +---------+---------------+---------+-----------+----------+--------------+ PERO                                                  Not visualized +---------+---------------+---------+-----------+----------+--------------+   +---------+---------------+---------+-----------+----------+--------------+ LEFT     CompressibilityPhasicitySpontaneityPropertiesThrombus Aging +---------+---------------+---------+-----------+----------+--------------+ CFV      Full           Yes      Yes                                 +---------+---------------+---------+-----------+----------+--------------+ SFJ      Full                                                        +---------+---------------+---------+-----------+----------+--------------+ FV Prox  Full                                                        +---------+---------------+---------+-----------+----------+--------------+ FV Mid   Full                                                        +---------+---------------+---------+-----------+----------+--------------+ FV DistalFull                                                        +---------+---------------+---------+-----------+----------+--------------+ PFV      Full                                                         +---------+---------------+---------+-----------+----------+--------------+ POP      Full           Yes      Yes                                 +---------+---------------+---------+-----------+----------+--------------+  PTV      Full                                                        +---------+---------------+---------+-----------+----------+--------------+ PERO                                                  Not visualized +---------+---------------+---------+-----------+----------+--------------+     Summary: BILATERAL: - No evidence of deep vein thrombosis seen in the lower extremities, bilaterally. -   *See table(s) above for measurements and observations. Electronically signed by Servando Snare MD on 07/12/2019 at 3:37:31 PM.    Final     Assessment/Plan 1. Diuretic-induced hypokalemia K+ 3.4 currently on Furosemide 20 mg tablet daily.will add low dose potassium chloride 10 meq tablet one by mouth daily. - will order BMP to be rechecked in 1 week.    2. Iron deficiency anemia, unspecified iron deficiency anemia type Hgb 10.1 at baseline. - continue on Ferrous sulfate daily with breakfast.  Will monitor Hgb.   3. Multiple skin tears Skin tears to Right upper arm,right leg left arm dressing dry,clean and intact.old purple Bruises on forearms.No signs of infections.skin thin and fragile.continue current dressing changes.  4. Constipation  Reports no bowel movement x 3 days.On Senokot 8.6-50 mg tablet at bedtime PRN,Metamucil one packet PRN and Miralax 17 gm packet daily as needed.Will schedule miralax 17 gm packet mix one packet in 8 oz of fluid and drink daily for constipation.Hold for loose stool.   Family/ staff Communication: plan of care discussed with patient and facility Nurse supervisor verbalized understanding.   Labs/tests ordered:  BMP in 1 week.

## 2019-08-10 ENCOUNTER — Encounter: Payer: Self-pay | Admitting: Internal Medicine

## 2019-08-13 DIAGNOSIS — S21209A Unspecified open wound of unspecified back wall of thorax without penetration into thoracic cavity, initial encounter: Secondary | ICD-10-CM | POA: Diagnosis not present

## 2019-08-13 DIAGNOSIS — S21209D Unspecified open wound of unspecified back wall of thorax without penetration into thoracic cavity, subsequent encounter: Secondary | ICD-10-CM | POA: Diagnosis not present

## 2019-08-15 ENCOUNTER — Other Ambulatory Visit: Payer: Self-pay | Admitting: *Deleted

## 2019-08-15 NOTE — Patient Outreach (Signed)
Screened for potential [facility] Care Management needs as a benefit of  [employer].  [name] is receiving skilled therapy at [facility]   Writer attended telephonic interdisciplinary team meeting to assess for disposition needs and transition plan for resident.   Facility reports family has not considered alternative to [facility] thus far. Facility has scheduled meeting this Friday.   Will continue to follow for transition plans.   [name] Rolling, MSN-Ed, RN,BSN [facility] Acute Care Coordinator [phone] [facility]) [phone]  (Toll free office)

## 2019-08-16 DIAGNOSIS — Z7189 Other specified counseling: Secondary | ICD-10-CM | POA: Diagnosis not present

## 2019-08-16 DIAGNOSIS — G4733 Obstructive sleep apnea (adult) (pediatric): Secondary | ICD-10-CM | POA: Diagnosis not present

## 2019-08-16 DIAGNOSIS — T148XXA Other injury of unspecified body region, initial encounter: Secondary | ICD-10-CM | POA: Diagnosis not present

## 2019-08-16 DIAGNOSIS — L03312 Cellulitis of back [any part except buttock]: Secondary | ICD-10-CM | POA: Diagnosis not present

## 2019-08-16 DIAGNOSIS — I129 Hypertensive chronic kidney disease with stage 1 through stage 4 chronic kidney disease, or unspecified chronic kidney disease: Secondary | ICD-10-CM | POA: Diagnosis not present

## 2019-08-16 DIAGNOSIS — L89120 Pressure ulcer of left upper back, unstageable: Secondary | ICD-10-CM | POA: Diagnosis not present

## 2019-08-16 DIAGNOSIS — M7989 Other specified soft tissue disorders: Secondary | ICD-10-CM | POA: Diagnosis not present

## 2019-08-16 DIAGNOSIS — G8929 Other chronic pain: Secondary | ICD-10-CM | POA: Diagnosis not present

## 2019-08-16 DIAGNOSIS — D631 Anemia in chronic kidney disease: Secondary | ICD-10-CM | POA: Diagnosis not present

## 2019-08-16 DIAGNOSIS — G992 Myelopathy in diseases classified elsewhere: Secondary | ICD-10-CM | POA: Diagnosis not present

## 2019-08-16 DIAGNOSIS — D509 Iron deficiency anemia, unspecified: Secondary | ICD-10-CM | POA: Diagnosis not present

## 2019-08-16 DIAGNOSIS — Z9181 History of falling: Secondary | ICD-10-CM | POA: Diagnosis not present

## 2019-08-16 DIAGNOSIS — R918 Other nonspecific abnormal finding of lung field: Secondary | ICD-10-CM | POA: Diagnosis not present

## 2019-08-16 DIAGNOSIS — N1832 Chronic kidney disease, stage 3b: Secondary | ICD-10-CM | POA: Diagnosis not present

## 2019-08-16 DIAGNOSIS — M48061 Spinal stenosis, lumbar region without neurogenic claudication: Secondary | ICD-10-CM | POA: Diagnosis not present

## 2019-08-16 DIAGNOSIS — N179 Acute kidney failure, unspecified: Secondary | ICD-10-CM | POA: Diagnosis not present

## 2019-08-16 DIAGNOSIS — I959 Hypotension, unspecified: Secondary | ICD-10-CM | POA: Diagnosis not present

## 2019-08-16 DIAGNOSIS — I495 Sick sinus syndrome: Secondary | ICD-10-CM | POA: Diagnosis not present

## 2019-08-16 DIAGNOSIS — R2681 Unsteadiness on feet: Secondary | ICD-10-CM | POA: Diagnosis not present

## 2019-08-16 DIAGNOSIS — F411 Generalized anxiety disorder: Secondary | ICD-10-CM | POA: Diagnosis not present

## 2019-08-16 DIAGNOSIS — I4819 Other persistent atrial fibrillation: Secondary | ICD-10-CM | POA: Diagnosis not present

## 2019-08-16 DIAGNOSIS — R54 Age-related physical debility: Secondary | ICD-10-CM | POA: Diagnosis not present

## 2019-08-16 DIAGNOSIS — R488 Other symbolic dysfunctions: Secondary | ICD-10-CM | POA: Diagnosis not present

## 2019-08-16 DIAGNOSIS — M79643 Pain in unspecified hand: Secondary | ICD-10-CM | POA: Diagnosis not present

## 2019-08-16 DIAGNOSIS — M6281 Muscle weakness (generalized): Secondary | ICD-10-CM | POA: Diagnosis not present

## 2019-08-16 DIAGNOSIS — J432 Centrilobular emphysema: Secondary | ICD-10-CM | POA: Diagnosis not present

## 2019-08-16 DIAGNOSIS — M25511 Pain in right shoulder: Secondary | ICD-10-CM | POA: Diagnosis not present

## 2019-08-16 DIAGNOSIS — M25531 Pain in right wrist: Secondary | ICD-10-CM | POA: Diagnosis not present

## 2019-08-16 DIAGNOSIS — R600 Localized edema: Secondary | ICD-10-CM | POA: Diagnosis not present

## 2019-08-16 DIAGNOSIS — L89100 Pressure ulcer of unspecified part of back, unstageable: Secondary | ICD-10-CM | POA: Diagnosis not present

## 2019-08-16 DIAGNOSIS — R05 Cough: Secondary | ICD-10-CM | POA: Diagnosis not present

## 2019-08-16 DIAGNOSIS — J449 Chronic obstructive pulmonary disease, unspecified: Secondary | ICD-10-CM | POA: Diagnosis not present

## 2019-08-16 DIAGNOSIS — I5041 Acute combined systolic (congestive) and diastolic (congestive) heart failure: Secondary | ICD-10-CM | POA: Diagnosis not present

## 2019-08-16 DIAGNOSIS — J189 Pneumonia, unspecified organism: Secondary | ICD-10-CM | POA: Diagnosis not present

## 2019-08-16 DIAGNOSIS — R0602 Shortness of breath: Secondary | ICD-10-CM | POA: Diagnosis not present

## 2019-08-16 LAB — BASIC METABOLIC PANEL
BUN: 22 — AB (ref 4–21)
CO2: 26 — AB (ref 13–22)
Chloride: 106 (ref 99–108)
Creatinine: 0.7 (ref 0.6–1.3)
Glucose: 93
Potassium: 3.7 (ref 3.4–5.3)
Sodium: 140 (ref 137–147)

## 2019-08-16 LAB — COMPREHENSIVE METABOLIC PANEL
Calcium: 8.3 — AB (ref 8.7–10.7)
GFR calc non Af Amer: 79.28

## 2019-08-17 ENCOUNTER — Non-Acute Institutional Stay (SKILLED_NURSING_FACILITY): Payer: Medicare Other | Admitting: Internal Medicine

## 2019-08-17 DIAGNOSIS — M25531 Pain in right wrist: Secondary | ICD-10-CM | POA: Diagnosis not present

## 2019-08-17 DIAGNOSIS — G8929 Other chronic pain: Secondary | ICD-10-CM | POA: Diagnosis not present

## 2019-08-17 DIAGNOSIS — J432 Centrilobular emphysema: Secondary | ICD-10-CM | POA: Diagnosis not present

## 2019-08-17 DIAGNOSIS — M25511 Pain in right shoulder: Secondary | ICD-10-CM | POA: Diagnosis not present

## 2019-08-17 NOTE — Progress Notes (Signed)
Location:  [facility] [facility] Service:  SNF [phone]) Provider:  Indiya Izquierdo L. Mariea Clonts, D.O., C.M.D.  Lawerance Cruel, MD  Patient Care Team: Lawerance Cruel, MD as PCP - General ([place]) Jettie Booze, MD as PCP - Cardiology (Cardiology) Deboraha Sprang, MD as PCP - Electrophysiology (Cardiology) Wyatt Portela, MD (Hematology and Oncology) Kathie Rhodes, MD as Attending Physician (Urology)  Extended Emergency Contact Information Primary Emergency Contact: Bessinger,[name] Address: [place], [place] [address] [name] of [place] Phone: [phone] Mobile Phone: [phone] Relation: Son Secondary Emergency Contact: Abanoub, Hanken Mobile Phone: [phone] Relation: Son  Code Status:  DNR Goals of care: Advanced Directive information Advanced Directives 08/09/2019  Does Patient Have a Medical Advance Directive? -  Type of Advance Directive Out of facility DNR (pink MOST or yellow form)  Does patient want to make changes to medical advance directive? No - Patient declined  Copy of Ida in Chart? -  Would patient like information on creating a medical advance directive? -  Pre-existing out of facility DNR order (yellow form or pink MOST form) Pink MOST/Yellow Form most recent copy in chart - Physician notified to receive inpatient order   Chief Complaint  Patient presents with  . Acute Visit    right shoulder pain and decreased ROM    HPI:  Pt is a 86 y.o. male seen today for an acute visit for  Right shoulder pain and decreased ROM which he says has been a problem for about 3 weeks.  He did not c/o it b/c he thought it would go away.  It started after he was lifting some type of bar overhead.  He's now unable to lift his arm overhead and has considerable pain over the entire ball of his shoulder.    He would like his percocet to be more readily available for his back pain.  He says he  typically needs it about twice a day.  It's already ordered q 6 hr prn so he should not be having difficulty getting it based on the order.  He typically uses his albuterol inhaler when needed, but he did not come with an order.  He'd like to keep it at bedside.    He also says he's had this chronic cough (CHF to some extent) and it was getting better when he first came but is annoying him some now.  He has no fever, chills, mucus production.    He reports some relatively new pain in his right wrist with pronation and supination, no swelling, mild tenderness of the wrist, no warmth.    Past Medical History:  Diagnosis Date  . Abdominal hernia   . Abnormality of gait    due to low back pain , uses  cane and walker  . Anemia of chronic disease    Dr. Alen Blew  . Arthritis   . Bilateral lower extremity edema    wears TED hose  . Cancer [facility])    prostate- treated with radiation  . Cardiac pacemaker in situ 10/11/2006   medtronic  . Chronic iron deficiency anemia oncologist/ hematoloist-- dr Alen Blew   treatment IV Iron infusions  . Chronic low back pain   . CKD (chronic kidney disease), stage III   . COPD with emphysema [facility])    pulmologist-  dr Elsworth Soho  . DDD (degenerative disc disease), lumbosacral   . Degenerative  scoliosis   . Dyspnea    with exertion  . Full dentures   . GERD (gastroesophageal reflux disease)   . Gross hematuria   . Headache    history of migraines- "way in the past"  . Hereditary and idiopathic peripheral neuropathy    bilateral lower leg  . Hiatal hernia   . History of DVT of lower extremity 10/19/2011   chronic distal popliteal right lower extremity  . History of external beam radiation therapy 2008   prostate cancer  . History of pancreatitis 2013  . History of pneumothorax 09/2006   iatrogenic-- in setting cardiac pacemaker placement  . History of prostate cancer 2008   s/p  external radiation therapy  . Hypertension   . Injury of left ulnar nerve  06/2015   Dr. Jannifer Franklin   . Left carpal tunnel syndrome   . Lesion of bladder   . Macular degeneration of both eyes   . Macular degeneration of right eye   . Mild mitral regurgitation   . Mild mitral regurgitation    and trace AI 07/22/08 echo and 10/2010  . Mild pulmonary hypertension ([place])   . Nocturia   . OSA on CPAP   . PAF (paroxysmal atrial fibrillation) ([place]) 10/2011   1st episode documented 09/ 2013 admission for gallstons/ pancreatitis and prior to cholecystectomy , went back in NSR without interventeion  . Pancreatitis 10/2011   gallstone induced  . Presence of permanent cardiac pacemaker   . Prostate cancer ([place])    Dr. Karsten Ro  . Sinus node dysfunction (HCC)    s/p  cardiac pacemaker placement 10-11-2006  . Sinus node dysfunction (HCC)    pacemaker  . Spinal stenosis    L2  . Venous insufficiency   . Wears glasses   . Wears hearing aid in both ears    Past Surgical History:  Procedure Laterality Date  . BALLOON DILATION N/A 01/14/2015   Procedure: BALLOON DILATION;  Surgeon: Garlan Fair, MD;  Location: [facility];  Service: Endoscopy;  Laterality: N/A;  . BALLOON DILATION N/A 10/12/2017   Procedure: BALLOON DILATION;  Surgeon: Otis Brace, MD;  Location: [facility] ENDOSCOPY;  Service: Gastroenterology;  Laterality: N/A;  . BIOPSY  10/12/2017   Procedure: BIOPSY;  Surgeon: Otis Brace, MD;  Location: [facility] ENDOSCOPY;  Service: Gastroenterology;;  . CARDIAC CATHETERIZATION  08-29-2000   [facility]   no significant obstructive CAD, intact globle LV size and systolic function with regional wall motion abnormalities noted,  ?coronary spasm producing wall motion abnormality, elevated CPK and chest pain (ef 55%)  . CARDIAC PACEMAKER PLACEMENT  10-11-2006    dr Leonia Reeves   W/  ATRIAL LEAD REVISION 10-12-2006   (medtronic)  . CARDIOVASCULAR STRESS TEST  11-18-2011    dr Irish Lack   Low risk nuclear study w/ no ishemia/  normal wall motion,  post-stress ef 48% (LVEF 56% on prior  study 2008)  . CARDIOVERSION N/A 04/02/2019   Procedure: CARDIOVERSION;  Surgeon: Josue Hector, MD;  Location: [facility] ENDOSCOPY;  Service: Cardiovascular;  Laterality: N/A;  . CARDIOVERSION N/A 07/24/2019   Procedure: CARDIOVERSION;  Surgeon: Dorothy Spark, MD;  Location: [facility] ENDOSCOPY;  Service: Cardiovascular;  Laterality: N/A;  . CARPAL TUNNEL RELEASE Left 11/14/2014   Procedure: CARPAL TUNNEL RELEASE LEFT THUMB;  Surgeon: Daryll Brod, MD;  Location: [facility];  Service: Orthopedics;  Laterality: Left;  ANESTHESIA:  IV REGIONAL FAB  . CATARACT EXTRACTION W/ INTRAOCULAR LENS  IMPLANT, BILATERAL    .  CHOLECYSTECTOMY  10/23/2011  . CHOLECYSTECTOMY  10/23/2011   Procedure: CHOLECYSTECTOMY;  Surgeon: Rolm Bookbinder, MD;  Location: [facility];  Service: General;  Laterality: N/A;  with intraoperative cholangiogram  . COLONOSCOPY    . ESOPHAGOGASTRODUODENOSCOPY (EGD) WITH PROPOFOL N/A 01/14/2015   Procedure: ESOPHAGOGASTRODUODENOSCOPY (EGD) WITH PROPOFOL;  Surgeon: Garlan Fair, MD;  Location: [facility] ENDOSCOPY;  Service: Endoscopy;  Laterality: N/A;  . ESOPHAGOGASTRODUODENOSCOPY (EGD) WITH PROPOFOL N/A 10/12/2017   Procedure: ESOPHAGOGASTRODUODENOSCOPY (EGD) WITH PROPOFOL;  Surgeon: Otis Brace, MD;  Location: [facility];  Service: Gastroenterology;  Laterality: N/A;  . I & D EXTREMITY Left 10/20/2012   Procedure: LEFT LEG IRRIGATION AND DEBRIDEMENT, AND WOUND VAC APPLICATION;  Surgeon: Marianna Payment, MD;  Location: [facility];  Service: Orthopedics;  Laterality: Left;  . I & D EXTREMITY Left 10/22/2012   Procedure: IRRIGATION AND DEBRIDEMENT EXTREMITY;  Surgeon: Marianna Payment, MD;  Location: [facility];  Service: Orthopedics;  Laterality: Left;  . INGUINAL HERNIA REPAIR Left yrs ago  . LAPAROSCOPIC ASSISTED VENTRAL HERNIA REPAIR  08-08-2009   dr Excell Seltzer   AND OPEN REPAIR RECURRENT LEFT INGUINAL HERNIA  . LAPAROSCOPIC INGUINAL HERNIA REPAIR Left 07-24-1999    dr Excell Seltzer  . OPEN  VENTRAL HERNIA REPAIR /  LYSIS ADHESIONS  09-23-2010    dr Donne Hazel  . ORIF ELBOW FRACTURE Left 03/29/2015   Procedure: LEFT ELBOW OPEN REDUCTION INTERNAL FIXATION (ORIF) DISTAL HUMERUS FRACTURE WITH OLECRANON OSTEOTOMY AND ULNAR NERVE RELEASE;  Surgeon: Roseanne Kaufman, MD;  Location: [facility];  Service: Orthopedics;  Laterality: Left;  . PPM GENERATOR CHANGEOUT N/A 02/10/2018   Procedure: PPM GENERATOR CHANGEOUT;  Surgeon: Deboraha Sprang, MD;  Location: [facility] CV LAB;  Service: Cardiovascular;  Laterality: N/A;  . REPAIR SUPRAUMBILICAL HERNIA   [phone]   dr Excell Seltzer  . SHOULDER ARTHROSCOPY WITH DISTAL CLAVICLE RESECTION Right 11-07-2007   dr Rhona Raider   [place] ACROMIOPLASTY  . SKIN SPLIT GRAFT Left 10/22/2012   Procedure: SKIN GRAFT SPLIT THICKNESS;  Surgeon: Marianna Payment, MD;  Location: [facility];  Service: Orthopedics;  Laterality: Left;  . TEE WITHOUT CARDIOVERSION N/A 07/24/2019   Procedure: TRANSESOPHAGEAL ECHOCARDIOGRAM (TEE);  Surgeon: Dorothy Spark, MD;  Location: [facility] ENDOSCOPY;  Service: Cardiovascular;  Laterality: N/A;  . TOTAL KNEE ARTHROPLASTY Right 1990's  . TRANSTHORACIC ECHOCARDIOGRAM  06-12-2012   dr Irish Lack   ef 50-55%/ mild LAE/ mild MR and TR/  AV sclerosis without stenosis/  RVSP 68mmHg  . TRANSURETHRAL RESECTION OF BLADDER TUMOR N/A 07/15/2017   Procedure: CYSTOSCOPY TRANSURETHRAL RESECTION OF BLADDER TUMOR (TURBT);  Surgeon: Kathie Rhodes, MD;  Location: [facility];  Service: Urology;  Laterality: N/A;    No Known Allergies  Outpatient Encounter Medications as of 08/17/2019  Medication Sig  . acetaminophen (TYLENOL) 650 MG CR tablet Take 650 mg by mouth 2 (two) times daily.  Marland Kitchen amiodarone (PACERONE) 200 MG tablet Take 1 tablet (200 mg total) by mouth 2 (two) times daily.  Marland Kitchen apixaban (ELIQUIS) 2.5 MG TABS tablet Take 1 tablet (2.5 mg total) by mouth 2 (two) times daily.  . Calcium Carbonate-Vitamin D (CALCIUM-D PO) Take 1  tablet by mouth every evening.   . Cholecalciferol (VITAMIN D3 PO) Take 2,000 Units by mouth daily.  . ferrous sulfate 325 (65 FE) MG tablet Take 650 mg by mouth daily with breakfast.  . fluticasone (FLONASE) 50 MCG/ACT nasal spray Place 1 spray into both nostrils daily as needed for allergies  or rhinitis.  . Folic Acid-Vit U9-NAT F57 (FOLBEE) 2.5-25-1 MG TABS tablet Take 1 tablet by mouth daily.  . furosemide (LASIX) 20 MG tablet Take 20 mg by mouth daily.   Marland Kitchen losartan (COZAAR) 25 MG tablet Take 0.5 tablets (12.5 mg total) by mouth daily.  . metoprolol succinate (TOPROL-XL) 25 MG 24 hr tablet Take 1 tablet (25 mg total) by mouth daily.  . mirabegron ER (MYRBETRIQ) 50 MG TB24 tablet Take 50 mg by mouth daily.  . Multiple Vitamins-Minerals (PRESERVISION AREDS 2 PO) Take 1 each by mouth in the morning and at bedtime.  Marland Kitchen oxyCODONE (OXY IR/ROXICODONE) 5 MG immediate release tablet Take 1 tablet (5 mg total) by mouth every 6 (six) hours as needed for severe pain.  Marland Kitchen oxymetazoline (AFRIN) 0.05 % nasal spray Place 1 spray into both nostrils 2 (two) times daily as needed for congestion.  . pantoprazole (PROTONIX) 40 MG tablet Take 1 tablet (40 mg total) by mouth daily before breakfast.  . polyethylene glycol (MIRALAX) 17 g packet Take 17 g by mouth daily as needed.  . predniSONE (DELTASONE) 10 MG tablet Take 10 mg by mouth daily with breakfast.  . psyllium (METAMUCIL SMOOTH TEXTURE) 28 % packet Take 1 packet by mouth daily as needed. Mix with 8 oz liquid  . senna-docusate (SENOKOT-S) 8.6-50 MG tablet Take 1 tablet by mouth at bedtime as needed for mild constipation.  Marland Kitchen umeclidinium-vilanterol (ANORO ELLIPTA) 62.5-25 MCG/INH AEPB Inhale 1 puff into the lungs daily.   . vitamin B-12 (CYANOCOBALAMIN) 500 MCG tablet Take 500 mcg by mouth. Three days a week   No facility-administered encounter medications on file as of 08/17/2019.    Review of Systems  Constitutional: Positive for malaise/fatigue. Negative  for chills and fever.  Respiratory: Positive for cough and wheezing. Negative for sputum production and shortness of breath.   Cardiovascular: Negative for chest pain and palpitations.  Gastrointestinal: Negative for abdominal pain.  Genitourinary: Negative for dysuria.  Musculoskeletal: Positive for back pain and joint pain. Negative for falls.       Right shoulder  Neurological: Negative for dizziness and loss of consciousness.  Endo/Heme/Allergies: Bruises/bleeds easily.    Immunization History  Administered Date(s) Administered  . Fluad Quad(high Dose 65+) 11/02/2018  . Influenza Split 11/02/2015  . Influenza, High Dose Seasonal PF 11/14/2016, 11/07/2017  . Influenza,inj,Quad PF,6+ Mos 10/16/2012  . Influenza-Unspecified 11/15/2013  . Moderna SARS-COVID-2 Vaccination 03/06/2019, 04/03/2019  . Pneumococcal Conjugate-13 06/03/2014  . Pneumococcal-Unspecified 05/04/2013  . Tdap 10/05/2012  . Zoster 02/16/2011  . Zoster Recombinat (Shingrix) 04/15/2017, 06/15/2017   Pertinent  Health Maintenance Due  Topic Date Due  . INFLUENZA VACCINE  09/16/2019  . PNA vac Low Risk Adult  Completed   Fall Risk  06/27/2019 12/30/2017  Falls in the past year? 1 1  Number falls in past yr: 1 1  Injury with Fall? 1 1  Comment bruises and skin damage -  Follow up - Falls evaluation completed   Functional Status Survey:    There were no vitals filed for this visit. There is no height or weight on file to calculate BMI. Physical Exam Vitals reviewed.  Constitutional:      General: He is not in acute distress.    Appearance: He is not ill-appearing or toxic-appearing.     Comments: Frail male resting in bed  HENT:     Head: Normocephalic and atraumatic.     Ears:     Comments: Hearing aids  Mouth/Throat:     Comments: hoarseness Cardiovascular:     Rate and Rhythm: Rhythm irregular.     Pulses: Normal pulses.     Heart sounds: Normal heart sounds.  Pulmonary:     Effort:  Pulmonary effort is normal.     Breath sounds: Wheezing present.  Abdominal:     General: Bowel sounds are normal.  Musculoskeletal:        General: Tenderness present. No swelling.     Comments: Right shoulder unable to abduct above 90 or flex above 90 even passively due to pain over lateral shoulder and rotator cuff insertion  Skin:    General: Skin is warm and dry.     Comments: Senile purpura of arms and hands  Neurological:     Mental Status: He is alert and oriented to person, place, and time.     Comments: No longer ambulatory  Psychiatric:        Mood and Affect: Mood normal.        Behavior: Behavior normal.     Labs reviewed: Recent Labs    07/23/19 0322 07/23/19 0322 07/24/19 0350 07/24/19 0350 07/24/19 1540 07/24/19 1540 07/25/19 0321 07/31/19 0000 08/08/19 0000  NA 136   < > 139   < > 139   < > 140 137 137  K 4.5   < > 4.9   < > 4.5   < > 4.3 4.0 3.4  CL 96*   < > 100   < > 102   < > 102 101 103  CO2 32   < > 29   < > 31   < > 30 22 25*  GLUCOSE 105*   < > 87  --  135*  --  87  --   --   BUN 50*   < > 47*   < > 44*   < > 42* 21 23*  CREATININE 1.42*   < > 1.39*   < > 1.23   < > 1.35* 1.1 0.9  CALCIUM 8.3*   < > 8.4*   < > 8.0*   < > 7.9* 8.7 8.3*  MG 2.1  --  2.1  --   --   --  2.0  --   --    < > = values in this interval not displayed.   Recent Labs    06/27/19 1221 07/10/19 1620  AST  --  38  ALT  --  30  ALKPHOS  --  81  BILITOT  --  1.9*  PROT 5.8* 5.7*  ALBUMIN  --  3.4*   Recent Labs    07/15/19 0303 07/15/19 0303 07/16/19 0234 07/16/19 0234 07/19/19 1411 07/20/19 0405 07/23/19 0322 07/23/19 0322 07/24/19 0350 07/24/19 0350 07/25/19 0321 07/31/19 0000 08/08/19 0000  WBC 10.3   < > 9.3   < > 8.5   < > 7.8   < > 7.5   < > 6.6 7.0 7.2  NEUTROABS 9.0*  --  8.5*  --  7.6  --   --   --   --   --   --   --   --   HGB 13.1   < > 11.9*   < > 12.6*   < > 11.3*   < > 11.1*   < > 10.4* 11.4* 10.1*  HCT 43.1   < > 39.0   < > 40.2   < >  36.4*   < > 36.6*   < >  33.9* 35* 30*  MCV 109.4*   < > 111.1*   < > 108.1*   < > 107.7*  --  111.2*  --  109.0*  --   --   PLT 136*   < > 115*   < > 142*   < > 149*   < > 158   < > 147* 168 112*   < > = values in this interval not displayed.   Lab Results  Component Value Date   TSH 1.512 07/11/2019   No results found for: HGBA1C No results found for: CHOL, HDL, LDLCALC, LDLDIRECT, TRIG, CHOLHDL  Significant Diagnostic Results in last 30 days:  ECHO TEE  Result Date: 07/24/2019    TRANSESOPHOGEAL ECHO REPORT   Patient Name:   [name] [name] Date of Exam: 07/24/2019 Medical Rec #:  [id number]         Height:       70.0 in Accession #:    [id number]        Weight:       161.6 lb Date of Birth:  05/23/1924          BSA:          1.906 m Patient Age:    68 years          BP:           109/57 mmHg Patient Gender: M                 HR:           75 bpm. Exam Location:  Inpatient Procedure: Transesophageal Echo, Cardiac Doppler and Color Doppler Indications:    atrial fibrillation  History:        Patient has prior history of Echocardiogram examinations. CHF;                 Mitral Valve Disease.  Sonographer:    Clayton Lefort RDCS (AE) Referring Phys: [id number] ANGELA NICOLE DUKE PROCEDURE: The transesophogeal probe was passed without difficulty through the esophogus of the patient. Sedation performed by different physician. Image quality was good. The patient developed no complications during the procedure. The patient also received 6 mg of iv Etomidate for conscious sedation. IMPRESSIONS  1. No thrombus was seen in the left atrial appendage or left atrium.  2. Left ventricular ejection fraction, by estimation, is <20%. The left ventricle has severely decreased function. The left ventricle demonstrates global hypokinesis. The left ventricular internal cavity size was moderately dilated. Left ventricular diastolic function could not be evaluated.  3. Right ventricular systolic function is moderately reduced. The  right ventricular size is moderately enlarged. There is moderately elevated pulmonary artery systolic pressure. The estimated right ventricular systolic pressure is 54.0 mmHg.  4. Low LAA filling and emptying velocities. Left atrial size was severely dilated. No left atrial/left atrial appendage thrombus was detected.  5. Right atrial size was moderately dilated.  6. The mitral valve is degenerative. Moderate to severe mitral valve regurgitation. No evidence of mitral stenosis.  7. The tricuspid valve is degenerative. Tricuspid valve regurgitation is moderate.  8. The aortic valve is tricuspid. Aortic valve regurgitation is trivial. Moderate aortic valve stenosis.  9. There is Moderate (Grade III) atheroma plaque involving the descending aorta. 10. Evidence of atrial level shunting detected by color flow Doppler. There is a small patent foramen ovale with predominantly left to right shunting across the atrial septum. 11. The patient also received 6 mg of iv Etomidate for  conscious sedation. Conclusion(s)/Recommendation(s): No LA/LAA thrombus identified. Successful cardioversion performed with restoration of normal sinus rhythm. FINDINGS  Left Ventricle: Left ventricular ejection fraction, by estimation, is <20%. The left ventricle has severely decreased function. The left ventricle demonstrates global hypokinesis. The left ventricular internal cavity size was moderately dilated. There is no left ventricular hypertrophy. Left ventricular diastolic function could not be evaluated. Right Ventricle: The right ventricular size is moderately enlarged. No increase in right ventricular wall thickness. Right ventricular systolic function is moderately reduced. There is moderately elevated pulmonary artery systolic pressure. The tricuspid  regurgitant velocity is 2.84 m/s, and with an assumed right atrial pressure of 8 mmHg, the estimated right ventricular systolic pressure is 69.7 mmHg. Left Atrium: Low LAA filling and  emptying velocities. Left atrial size was severely dilated. No left atrial/left atrial appendage thrombus was detected. Right Atrium: Right atrial size was moderately dilated. Pericardium: Trivial pericardial effusion is present. Mitral Valve: The mitral valve is degenerative in appearance. Moderate to severe mitral valve regurgitation. No evidence of mitral valve stenosis. Tricuspid Valve: The tricuspid valve is degenerative in appearance. Tricuspid valve regurgitation is moderate. Aortic Valve: The aortic valve is tricuspid. . There is severe thickening and severe calcifcation of the aortic valve. Aortic valve regurgitation is trivial. Moderate aortic stenosis is present. There is severe thickening of the aortic valve. There is severe calcifcation of the aortic valve. Pulmonic Valve: The pulmonic valve was normal in structure. Pulmonic valve regurgitation is trivial. Aorta: The ascending aorta was not well visualized. There is moderate (Grade III) atheroma plaque involving the descending aorta. IAS/Shunts: Evidence of atrial level shunting detected by color flow Doppler. A small patent foramen ovale is detected with predominantly left to right shunting across the atrial septum. Additional Comments: A pacer wire is visualized.  TRICUSPID VALVE TR Peak grad:   32.3 mmHg TR Vmax:        284.00 cm/s Ena Dawley MD Electronically signed by Ena Dawley MD Signature Date/Time: 07/24/2019/12:30:43 PM    Final     Assessment/Plan 1. Chronic right shoulder pain -chart indicates that at some point he was diagnosed with an impingement syndrome, but he does not recall this -will r/o dislocation and bony pathology with portable xrays -may now have an adhesive capsulitis -his percocet should help the pain  2. Centrilobular emphysema ([place]) -resume mucinex for two weeks -resume albuterol hfa 2 puffs q6h prn sob/wheezing--may keep at bedside  3. Right wrist pain -does not appear to be an inflammatory  arthritis/flare -suspect due to arthritis   Family/ staff Communication: discussed with ADON  Labs/tests ordered:  Right shoulder xrays portable  Aleta Manternach L. Felisia Balcom, D.O. [facility] [address], [place] [address] Cell Phone (Mon-Fri 8am-5pm):  [phone] On Call:  [phone] & follow prompts after 5pm & weekends Office Phone:  [phone] Office Fax:  [phone]

## 2019-08-19 ENCOUNTER — Encounter: Payer: Self-pay | Admitting: Internal Medicine

## 2019-08-22 ENCOUNTER — Other Ambulatory Visit: Payer: Self-pay | Admitting: *Deleted

## 2019-08-22 ENCOUNTER — Encounter: Payer: Self-pay | Admitting: Adult Health

## 2019-08-22 ENCOUNTER — Non-Acute Institutional Stay (SKILLED_NURSING_FACILITY): Payer: Medicare Other | Admitting: Adult Health

## 2019-08-22 DIAGNOSIS — R05 Cough: Secondary | ICD-10-CM | POA: Diagnosis not present

## 2019-08-22 DIAGNOSIS — I5041 Acute combined systolic (congestive) and diastolic (congestive) heart failure: Secondary | ICD-10-CM

## 2019-08-22 DIAGNOSIS — J432 Centrilobular emphysema: Secondary | ICD-10-CM | POA: Diagnosis not present

## 2019-08-22 DIAGNOSIS — R059 Cough, unspecified: Secondary | ICD-10-CM

## 2019-08-22 NOTE — Progress Notes (Signed)
Location:  [place] Room Number: 505/P Place of Service:  SNF (31) Provider:  Durenda Age, DNP, FNP-BC  Patient Care Team: Lawerance Cruel, MD as PCP - General ([place]) Jettie Booze, MD as PCP - Cardiology (Cardiology) Deboraha Sprang, MD as PCP - Electrophysiology (Cardiology) Wyatt Portela, MD (Hematology and Oncology) Kathie Rhodes, MD as Attending Physician (Urology)  Extended Emergency Contact Information Primary Emergency Contact: Murgia,[name] Address: [place], [place] [address] [name] of [place] Phone: [phone] Mobile Phone: [phone] Relation: Son Secondary Emergency Contact: Conan, [place] Mobile Phone: [phone] Relation: Son  Code Status:  DNR  Goals of care: Advanced Directive information Advanced Directives 08/22/2019  Does Patient Have a Medical Advance Directive? Yes  Type of Advance Directive Out of facility DNR (pink MOST or yellow form)  Does patient want to make changes to medical advance directive? No - Patient declined  Copy of [place] in Chart? -  Would patient like information on creating a medical advance directive? -  Pre-existing out of facility DNR order (yellow form or pink MOST form) Yellow form placed in chart (order not valid for inpatient use)     Chief Complaint  Patient presents with  . Acute Visit    Cough and Congestion    HPI:  Pt is a 86 y.o. male seen today for cough and congestion.  He has a PMH of atrial fibrillation with pacemaker, chronic kidney disease stage III and COPD.  He was seen in his room today.  He stated that he is coughing but there was no phlegm.  Upon auscultation, noted crackles on bilateral lung fields.  There was no reported fever.   Past Medical History:  Diagnosis Date  . Abdominal hernia   . Abnormality of gait    due to low back pain , uses  cane and walker  . Anemia of  chronic disease    Dr. Alen Blew  . Arthritis   . Bilateral lower extremity edema    wears TED hose  . Cancer [facility])    prostate- treated with radiation  . Cardiac pacemaker in situ 10/11/2006   medtronic  . Chronic iron deficiency anemia oncologist/ hematoloist-- dr Alen Blew   treatment IV Iron infusions  . Chronic low back pain   . CKD (chronic kidney disease), stage III   . COPD with emphysema [facility])    pulmologist-  dr Elsworth Soho  . DDD (degenerative disc disease), lumbosacral   . Degenerative scoliosis   . Dyspnea    with exertion  . Full dentures   . GERD (gastroesophageal reflux disease)   . Gross hematuria   . Headache    history of migraines- "way in the past"  . Hereditary and idiopathic peripheral neuropathy    bilateral lower leg  . Hiatal hernia   . History of DVT of lower extremity 10/19/2011   chronic distal popliteal right lower extremity  . History of external beam radiation therapy 2008   prostate cancer  . History of pancreatitis 2013  . History of pneumothorax 09/2006   iatrogenic-- in setting cardiac pacemaker placement  . History of prostate cancer 2008   s/p  external radiation therapy  . Hypertension   . Injury of left ulnar nerve 06/2015   Dr. Jannifer Franklin   . Left carpal tunnel syndrome   . Lesion of bladder   . Macular degeneration of  both eyes   . Macular degeneration of right eye   . Mild mitral regurgitation   . Mild mitral regurgitation    and trace AI 07/22/08 echo and 10/2010  . Mild pulmonary hypertension ([place])   . Nocturia   . OSA on CPAP   . PAF (paroxysmal atrial fibrillation) ([place]) 10/2011   1st episode documented 09/ 2013 admission for gallstons/ pancreatitis and prior to cholecystectomy , went back in NSR without interventeion  . Pancreatitis 10/2011   gallstone induced  . Presence of permanent cardiac pacemaker   . Prostate cancer ([place])    Dr. Karsten Ro  . Sinus node dysfunction (HCC)    s/p  cardiac pacemaker placement 10-11-2006  . Sinus  node dysfunction (HCC)    pacemaker  . Spinal stenosis    L2  . Venous insufficiency   . Wears glasses   . Wears hearing aid in both ears    Past Surgical History:  Procedure Laterality Date  . BALLOON DILATION N/A 01/14/2015   Procedure: BALLOON DILATION;  Surgeon: Garlan Fair, MD;  Location: [facility];  Service: Endoscopy;  Laterality: N/A;  . BALLOON DILATION N/A 10/12/2017   Procedure: BALLOON DILATION;  Surgeon: Otis Brace, MD;  Location: [place] ENDOSCOPY;  Service: Gastroenterology;  Laterality: N/A;  . BIOPSY  10/12/2017   Procedure: BIOPSY;  Surgeon: Otis Brace, MD;  Location: [place] ENDOSCOPY;  Service: Gastroenterology;;  . CARDIAC CATHETERIZATION  08-29-2000   [place]   no significant obstructive CAD, intact globle LV size and systolic function with regional wall motion abnormalities noted,  ?coronary spasm producing wall motion abnormality, elevated CPK and chest pain (ef 55%)  . CARDIAC PACEMAKER PLACEMENT  10-11-2006    dr Leonia Reeves   W/  ATRIAL LEAD REVISION 10-12-2006   (medtronic)  . CARDIOVASCULAR STRESS TEST  11-18-2011    dr Irish Lack   Low risk nuclear study w/ no ishemia/  normal wall motion,  post-stress ef 48% (LVEF 56% on prior study 2008)  . CARDIOVERSION N/A 04/02/2019   Procedure: CARDIOVERSION;  Surgeon: Josue Hector, MD;  Location: [facility] ENDOSCOPY;  Service: Cardiovascular;  Laterality: N/A;  . CARDIOVERSION N/A 07/24/2019   Procedure: CARDIOVERSION;  Surgeon: Dorothy Spark, MD;  Location: [facility] ENDOSCOPY;  Service: Cardiovascular;  Laterality: N/A;  . CARPAL TUNNEL RELEASE Left 11/14/2014   Procedure: CARPAL TUNNEL RELEASE LEFT THUMB;  Surgeon: Daryll Brod, MD;  Location: [place];  Service: Orthopedics;  Laterality: Left;  ANESTHESIA:  IV REGIONAL FAB  . CATARACT EXTRACTION W/ INTRAOCULAR LENS  IMPLANT, BILATERAL    . CHOLECYSTECTOMY  10/23/2011  . CHOLECYSTECTOMY  10/23/2011   Procedure: CHOLECYSTECTOMY;  Surgeon: Rolm Bookbinder, MD;   Location: [place];  Service: General;  Laterality: N/A;  with intraoperative cholangiogram  . COLONOSCOPY    . ESOPHAGOGASTRODUODENOSCOPY (EGD) WITH PROPOFOL N/A 01/14/2015   Procedure: ESOPHAGOGASTRODUODENOSCOPY (EGD) WITH PROPOFOL;  Surgeon: Garlan Fair, MD;  Location: [facility] ENDOSCOPY;  Service: Endoscopy;  Laterality: N/A;  . ESOPHAGOGASTRODUODENOSCOPY (EGD) WITH PROPOFOL N/A 10/12/2017   Procedure: ESOPHAGOGASTRODUODENOSCOPY (EGD) WITH PROPOFOL;  Surgeon: Otis Brace, MD;  Location: [place];  Service: Gastroenterology;  Laterality: N/A;  . I & D EXTREMITY Left 10/20/2012   Procedure: LEFT LEG IRRIGATION AND DEBRIDEMENT, AND WOUND VAC APPLICATION;  Surgeon: Marianna Payment, MD;  Location: [facility] ORS;  Service: Orthopedics;  Laterality: Left;  . I & D EXTREMITY Left 10/22/2012   Procedure: IRRIGATION AND DEBRIDEMENT EXTREMITY;  Surgeon: Marianna Payment, MD;  Location:  [facility] ORS;  Service: Orthopedics;  Laterality: Left;  . INGUINAL HERNIA REPAIR Left yrs ago  . LAPAROSCOPIC ASSISTED VENTRAL HERNIA REPAIR  08-08-2009   dr Excell Seltzer   AND OPEN REPAIR RECURRENT LEFT INGUINAL HERNIA  . LAPAROSCOPIC INGUINAL HERNIA REPAIR Left 07-24-1999    dr Excell Seltzer  . OPEN VENTRAL HERNIA REPAIR /  LYSIS ADHESIONS  09-23-2010    dr Donne Hazel  . ORIF ELBOW FRACTURE Left 03/29/2015   Procedure: LEFT ELBOW OPEN REDUCTION INTERNAL FIXATION (ORIF) DISTAL HUMERUS FRACTURE WITH OLECRANON OSTEOTOMY AND ULNAR NERVE RELEASE;  Surgeon: Roseanne Kaufman, MD;  Location: [place];  Service: Orthopedics;  Laterality: Left;  . PPM GENERATOR CHANGEOUT N/A 02/10/2018   Procedure: PPM GENERATOR CHANGEOUT;  Surgeon: Deboraha Sprang, MD;  Location: [place] CV LAB;  Service: Cardiovascular;  Laterality: N/A;  . REPAIR SUPRAUMBILICAL HERNIA   [phone]   dr Excell Seltzer  . SHOULDER ARTHROSCOPY WITH DISTAL CLAVICLE RESECTION Right 11-07-2007   dr Rhona Raider   [place] ACROMIOPLASTY  . SKIN SPLIT GRAFT Left 10/22/2012    Procedure: SKIN GRAFT SPLIT THICKNESS;  Surgeon: Marianna Payment, MD;  Location: [facility];  Service: Orthopedics;  Laterality: Left;  . TEE WITHOUT CARDIOVERSION N/A 07/24/2019   Procedure: TRANSESOPHAGEAL ECHOCARDIOGRAM (TEE);  Surgeon: Dorothy Spark, MD;  Location: [facility] ENDOSCOPY;  Service: Cardiovascular;  Laterality: N/A;  . TOTAL KNEE ARTHROPLASTY Right 1990's  . TRANSTHORACIC ECHOCARDIOGRAM  06-12-2012   dr Irish Lack   ef 50-55%/ mild LAE/ mild MR and TR/  AV sclerosis without stenosis/  RVSP 32mmHg  . TRANSURETHRAL RESECTION OF BLADDER TUMOR N/A 07/15/2017   Procedure: CYSTOSCOPY TRANSURETHRAL RESECTION OF BLADDER TUMOR (TURBT);  Surgeon: Kathie Rhodes, MD;  Location: [facility];  Service: Urology;  Laterality: N/A;    No Known Allergies  Outpatient Encounter Medications as of 08/22/2019  Medication Sig  . acetaminophen (TYLENOL) 325 MG tablet Take 650 mg by mouth every 6 (six) hours as needed (For Pain).  Marland Kitchen acetaminophen (TYLENOL) 650 MG CR tablet Take 650 mg by mouth 2 (two) times daily.  Marland Kitchen albuterol (VENTOLIN HFA) 108 (90 Base) MCG/ACT inhaler Inhale 2 puffs into the lungs every 6 (six) hours as needed for wheezing or shortness of breath.  Marland Kitchen amiodarone (PACERONE) 200 MG tablet Take 1 tablet (200 mg total) by mouth 2 (two) times daily.  Marland Kitchen apixaban (ELIQUIS) 2.5 MG TABS tablet Take 2.5 mg by mouth 2 (two) times daily. For A-Fib  . Calcium Carbonate-Vitamin D (CALCIUM-D PO) Take 1 tablet by mouth every evening.   . Cholecalciferol (VITAMIN D3 PO) Take 2,000 Units by mouth daily.  . diclofenac Sodium (VOLTAREN) 1 % GEL Apply 2 g topically 2 (two) times daily. To right shoulder  . ferrous sulfate 325 (65 FE) MG tablet Take 650 mg by mouth daily with breakfast.  . fluticasone (FLONASE) 50 MCG/ACT nasal spray Place 1 spray into both nostrils daily as needed for allergies or rhinitis.  . Folic Acid-Vit X9-KWI O97 (FOLBEE) 2.5-25-1 MG TABS tablet Take 1 tablet by mouth daily.    . furosemide (LASIX) 20 MG tablet Take 20 mg by mouth daily.   Marland Kitchen losartan (COZAAR) 25 MG tablet Take 0.5 tablets (12.5 mg total) by mouth daily.  . metoprolol succinate (TOPROL-XL) 25 MG 24 hr tablet Take 1 tablet (25 mg total) by mouth daily.  . mirabegron ER (MYRBETRIQ) 50 MG TB24 tablet Take 50 mg by mouth daily.  . Multiple Vitamins-Minerals (PRESERVISION AREDS  2 PO) Take 1 each by mouth in the morning and at bedtime.  . NON FORMULARY DIET: REGULAR, NAS, HEART HEALTHY  . oxyCODONE (OXY IR/ROXICODONE) 5 MG immediate release tablet Take 1 tablet (5 mg total) by mouth every 6 (six) hours as needed for severe pain.  Marland Kitchen oxymetazoline (AFRIN) 0.05 % nasal spray Place 1 spray into both nostrils 2 (two) times daily as needed for congestion.  . pantoprazole (PROTONIX) 40 MG tablet Take 1 tablet (40 mg total) by mouth daily before breakfast.  . polyethylene glycol (MIRALAX) 17 g packet Take 17 g by mouth daily as needed.  . potassium chloride (KLOR-CON) 10 MEQ tablet Take 10 mEq by mouth daily. FOR HYPOKALEMIA  . predniSONE (DELTASONE) 10 MG tablet Take 10 mg by mouth daily with breakfast.  . psyllium (METAMUCIL SMOOTH TEXTURE) 28 % packet Take 1 packet by mouth daily as needed. Mix with 8 oz liquid  . senna-docusate (SENOKOT-S) 8.6-50 MG tablet Take 1 tablet by mouth at bedtime as needed for mild constipation.  Marland Kitchen umeclidinium-vilanterol (ANORO ELLIPTA) 62.5-25 MCG/INH AEPB Inhale 1 puff into the lungs daily.   . vitamin B-12 (CYANOCOBALAMIN) 500 MCG tablet Take 500 mcg by mouth. Three days a week  . [DISCONTINUED] apixaban (ELIQUIS) 2.5 MG TABS tablet Take 1 tablet (2.5 mg total) by mouth 2 (two) times daily.   No facility-administered encounter medications on file as of 08/22/2019.    Review of Systems  GENERAL: No change in appetite, no fatigue, no fever, chills or weakness MOUTH and THROAT: Denies oral discomfort, gingival pain or bleeding, pain from teeth or hoarseness   RESPIRATORY: +cough,  no hemoptysis CARDIAC: No chest pain or palpitations GI: No abdominal pain, diarrhea, constipation, heart burn, nausea or vomiting GU: Denies dysuria, frequency, hematuria, incontinence, or discharge NEUROLOGICAL: Denies dizziness, syncope, numbness, or headache PSYCHIATRIC: Denies feelings of depression or anxiety. No report of hallucinations, insomnia, paranoia, or agitation   Immunization History  Administered Date(s) Administered  . Fluad Quad(high Dose 65+) 11/02/2018  . Influenza Split 12/01/2011, 11/28/2012, 12/05/2013, 11/02/2015, 10/16/2017  . Influenza, High Dose Seasonal PF 11/18/2014, 11/26/2015, 11/23/2016, 11/07/2017  . Influenza,inj,Quad PF,6+ Mos 10/16/2012  . Influenza-Unspecified 11/15/2013  . Moderna SARS-COVID-2 Vaccination 03/06/2019, 04/03/2019  . Pneumococcal Conjugate-13 05/14/2013, 06/03/2014  . Pneumococcal Polysaccharide-23 09/15/2005  . Pneumococcal-Unspecified 05/04/2013  . Td 11/28/2012  . Tdap 06/14/2007, 10/05/2012  . Zoster 04/20/2006, 02/16/2011  . Zoster Recombinat (Shingrix) 04/15/2017, 06/15/2017   Pertinent  Health Maintenance Due  Topic Date Due  . INFLUENZA VACCINE  09/16/2019  . PNA vac Low Risk Adult  Completed   Fall Risk  06/27/2019 12/30/2017  Falls in the past year? 1 1  Number falls in past yr: 1 1  Injury with Fall? 1 1  Comment bruises and skin damage -  Follow up - Falls evaluation completed     Vitals:   08/22/19 1540  BP: 140/79  Pulse: 78  Resp: 18  Temp: 98.1 F (36.7 C)  TempSrc: Oral  SpO2: 94%  Weight: 164 lb (74.4 kg)  Height: 5\' 9"  (1.753 m)   Body mass index is 24.22 kg/m.  Physical Exam  GENERAL APPEARANCE: Well nourished. In no acute distress. Normal body habitus SKIN: Unstageable pressure injury to mid back in bilateral arms with dressing and sleeves MOUTH and THROAT: Lips are without lesions. Oral mucosa is moist and without lesions. Tongue is normal in shape, size, and color and without  lesions RESPIRATORY: unlabored breathing, noted crackles on bilateral lung  fields CARDIAC: RRR, no murmur,no extra heart sounds, BLE 2+ edema GI: Abdomen soft, normal BS, no masses, no tenderness NEUROLOGICAL: There is no tremor. Speech is clear. Alert and oriented X 3. PSYCHIATRIC:  Affect and behavior are appropriate  Labs reviewed: Recent Labs    07/23/19 0322 07/23/19 0322 07/24/19 0350 07/24/19 0350 07/24/19 1540 07/24/19 1540 07/25/19 0321 07/31/19 0000 08/08/19 0000  NA 136   < > 139   < > 139   < > 140 137 137  K 4.5   < > 4.9   < > 4.5   < > 4.3 4.0 3.4  CL 96*   < > 100   < > 102   < > 102 101 103  CO2 32   < > 29   < > 31   < > 30 22 25*  GLUCOSE 105*   < > 87  --  135*  --  87  --   --   BUN 50*   < > 47*   < > 44*   < > 42* 21 23*  CREATININE 1.42*   < > 1.39*   < > 1.23   < > 1.35* 1.1 0.9  CALCIUM 8.3*   < > 8.4*   < > 8.0*   < > 7.9* 8.7 8.3*  MG 2.1  --  2.1  --   --   --  2.0  --   --    < > = values in this interval not displayed.   Recent Labs    06/27/19 1221 07/10/19 1620  AST  --  38  ALT  --  30  ALKPHOS  --  81  BILITOT  --  1.9*  PROT 5.8* 5.7*  ALBUMIN  --  3.4*   Recent Labs    07/15/19 0303 07/15/19 0303 07/16/19 0234 07/16/19 0234 07/19/19 1411 07/20/19 0405 07/23/19 0322 07/23/19 0322 07/24/19 0350 07/24/19 0350 07/25/19 0321 07/31/19 0000 08/08/19 0000  WBC 10.3   < > 9.3   < > 8.5   < > 7.8   < > 7.5   < > 6.6 7.0 7.2  NEUTROABS 9.0*  --  8.5*  --  7.6  --   --   --   --   --   --   --   --   HGB 13.1   < > 11.9*   < > 12.6*   < > 11.3*   < > 11.1*   < > 10.4* 11.4* 10.1*  HCT 43.1   < > 39.0   < > 40.2   < > 36.4*   < > 36.6*   < > 33.9* 35* 30*  MCV 109.4*   < > 111.1*   < > 108.1*   < > 107.7*  --  111.2*  --  109.0*  --   --   PLT 136*   < > 115*   < > 142*   < > 149*   < > 158   < > 147* 168 112*   < > = values in this interval not displayed.   Lab Results  Component Value Date   TSH 1.512 07/11/2019      Significant Diagnostic Results in last 30 days:  ECHO TEE  Result Date: 07/24/2019    TRANSESOPHOGEAL ECHO REPORT   Patient Name:   ADRICK KESTLER Date of Exam: 07/24/2019 Medical Rec #:  [id number]  Height:       70.0 in Accession #:    [id number]        Weight:       161.6 lb Date of Birth:  [birth year]          BSA:          1.906 m Patient Age:    7 years          BP:           109/57 mmHg Patient Gender: M                 HR:           75 bpm. Exam Location:  Inpatient Procedure: Transesophageal Echo, Cardiac Doppler and Color Doppler Indications:    atrial fibrillation  History:        Patient has prior history of Echocardiogram examinations. CHF;                 Mitral Valve Disease.  Sonographer:    Clayton Lefort RDCS (AE) Referring Phys: [id number] ANGELA NICOLE [place] PROCEDURE: The transesophogeal probe was passed without difficulty through the esophogus of the patient. Sedation performed by different physician. Image quality was good. The patient developed no complications during the procedure. The patient also received 6 mg of iv Etomidate for conscious sedation. IMPRESSIONS  1. No thrombus was seen in the left atrial appendage or left atrium.  2. Left ventricular ejection fraction, by estimation, is <20%. The left ventricle has severely decreased function. The left ventricle demonstrates global hypokinesis. The left ventricular internal cavity size was moderately dilated. Left ventricular diastolic function could not be evaluated.  3. Right ventricular systolic function is moderately reduced. The right ventricular size is moderately enlarged. There is moderately elevated pulmonary artery systolic pressure. The estimated right ventricular systolic pressure is 90.3 mmHg.  4. Low LAA filling and emptying velocities. Left atrial size was severely dilated. No left atrial/left atrial appendage thrombus was detected.  5. Right atrial size was moderately dilated.  6. The mitral valve is degenerative.  Moderate to severe mitral valve regurgitation. No evidence of mitral stenosis.  7. The tricuspid valve is degenerative. Tricuspid valve regurgitation is moderate.  8. The aortic valve is tricuspid. Aortic valve regurgitation is trivial. Moderate aortic valve stenosis.  9. There is Moderate (Grade III) atheroma plaque involving the descending aorta. 10. Evidence of atrial level shunting detected by color flow Doppler. There is a small patent foramen ovale with predominantly left to right shunting across the atrial septum. 11. The patient also received 6 mg of iv Etomidate for conscious sedation. Conclusion(s)/Recommendation(s): No LA/LAA thrombus identified. Successful cardioversion performed with restoration of normal sinus rhythm. FINDINGS  Left Ventricle: Left ventricular ejection fraction, by estimation, is <20%. The left ventricle has severely decreased function. The left ventricle demonstrates global hypokinesis. The left ventricular internal cavity size was moderately dilated. There is no left ventricular hypertrophy. Left ventricular diastolic function could not be evaluated. Right Ventricle: The right ventricular size is moderately enlarged. No increase in right ventricular wall thickness. Right ventricular systolic function is moderately reduced. There is moderately elevated pulmonary artery systolic pressure. The tricuspid  regurgitant velocity is 2.84 m/s, and with an assumed right atrial pressure of 8 mmHg, the estimated right ventricular systolic pressure is 00.9 mmHg. Left Atrium: Low LAA filling and emptying velocities. Left atrial size was severely dilated. No left atrial/left atrial appendage thrombus was detected. Right Atrium: Right atrial size was moderately dilated. Pericardium:  Trivial pericardial effusion is present. Mitral Valve: The mitral valve is degenerative in appearance. Moderate to severe mitral valve regurgitation. No evidence of mitral valve stenosis. Tricuspid Valve: The tricuspid  valve is degenerative in appearance. Tricuspid valve regurgitation is moderate. Aortic Valve: The aortic valve is tricuspid. . There is severe thickening and severe calcifcation of the aortic valve. Aortic valve regurgitation is trivial. Moderate aortic stenosis is present. There is severe thickening of the aortic valve. There is severe calcifcation of the aortic valve. Pulmonic Valve: The pulmonic valve was normal in structure. Pulmonic valve regurgitation is trivial. Aorta: The ascending aorta was not well visualized. There is moderate (Grade III) atheroma plaque involving the descending aorta. IAS/Shunts: Evidence of atrial level shunting detected by color flow Doppler. A small patent foramen ovale is detected with predominantly left to right shunting across the atrial septum. Additional Comments: A pacer wire is visualized.  TRICUSPID VALVE TR Peak grad:   32.3 mmHg TR Vmax:        284.00 cm/s Ena Dawley MD Electronically signed by Ena Dawley MD Signature Date/Time: 07/24/2019/12:30:43 PM    Final     Assessment/Plan  1. Cough -  Continue Mucinex ER 600 mg every 12 hours for a total of 14 days -Chest x-ray PA and lateral to rule out pneumonia, CBC  2. Centrilobular emphysema (HCC) -  No noted wheezing, continue Anoro Ellipta INH, PRN albuterol  3. Acute combined systolic and diastolic heart failure (HCC) -  Stable, continue furosemide, metoprolol succinate and losartan -  Check BNP and BMP    Family/ staff Communication:   Discussed plan of care with resident and charge nurse.  Labs/tests ordered: Chest x-ray PA and lateral, CBC, BNP and BMP on 08/23/2019  Goals of care:   Short-term care   Durenda Age, DNP, FNP-BC [facility] Adult [facility] [phone] (Monday-Friday 8:00 a.m. - 5:00 p.m.) [phone] (after hours)

## 2019-08-22 NOTE — Patient Outreach (Signed)
Screened for potential [facility] Care Management needs as a benefit of  [employer].  Mr. Condrey remains at [facility] SNF for skilled therapy.   Writer attended telephonic interdisciplinary team meeting to assess for disposition needs and transition plan for resident.   Facility reports member requires daily wound care. Reports increased pain. Mechanical lift used.   Transition plan is for long term care at [facility].   Will continue to follow while member resides in SNF.    Marthenia Rolling, MSN-Ed, RN,BSN [place] Acute Care Coordinator [phone] [facility]) [phone]  (Toll free office)

## 2019-08-23 ENCOUNTER — Encounter: Payer: Self-pay | Admitting: Family

## 2019-08-23 ENCOUNTER — Non-Acute Institutional Stay (SKILLED_NURSING_FACILITY): Payer: Medicare Other | Admitting: Family

## 2019-08-23 DIAGNOSIS — R600 Localized edema: Secondary | ICD-10-CM

## 2019-08-23 DIAGNOSIS — R918 Other nonspecific abnormal finding of lung field: Secondary | ICD-10-CM

## 2019-08-23 LAB — BASIC METABOLIC PANEL
BUN: 28 — AB (ref 4–21)
CO2: 23 — AB (ref 13–22)
Chloride: 108 (ref 99–108)
Creatinine: 0.6 (ref 0.6–1.3)
Glucose: 103
Potassium: 3.8 (ref 3.4–5.3)
Sodium: 144 (ref 137–147)

## 2019-08-23 LAB — CBC AND DIFFERENTIAL
HCT: 30 — AB (ref 41–53)
Hemoglobin: 9.8 — AB (ref 13.5–17.5)
WBC: 7.8

## 2019-08-23 LAB — COMPREHENSIVE METABOLIC PANEL
Calcium: 8.4 — AB (ref 8.7–10.7)
GFR calc non Af Amer: 84.69

## 2019-08-23 LAB — CBC: RBC: 2.99 — AB (ref 3.87–5.11)

## 2019-08-23 NOTE — Progress Notes (Signed)
Location:    [place].   Nursing Home Room Number: 626-R Place of Service:  SNF (31) Provider:  Marlowe Sax, NP  Patient Care Team: Lawerance Cruel, MD as PCP - General ([place]) Jettie Booze, MD as PCP - Cardiology (Cardiology) Deboraha Sprang, MD as PCP - Electrophysiology (Cardiology) Wyatt Portela, MD (Hematology and Oncology) Kathie Rhodes, MD as Attending Physician (Urology)  Extended Emergency Contact Information Primary Emergency Contact: Graves,[name] Address: [place], [place] [address] [name] of [place] Phone: [phone] Mobile Phone: [phone] Relation: Son Secondary Emergency Contact: Deloy, Archey Mobile Phone: [phone] Relation: Son  Code Status:  DNR Goals of care: Advanced Directive information Advanced Directives 08/23/2019  Does Patient Have a Medical Advance Directive? Yes  Type of Advance Directive Out of facility DNR (pink MOST or yellow form)  Does patient want to make changes to medical advance directive? No - Patient declined  Copy of [place] in Chart? -  Would patient like information on creating a medical advance directive? -  Pre-existing out of facility DNR order (yellow form or pink MOST form) Yellow form placed in chart (order not valid for inpatient use)     Chief Complaint  Patient presents with  . Acute Visit    Abnormal Chest X-Ray.    HPI:  Pt is a 86 y.o. male seen today for an acute visit for abnormal chest X-ray.He is seen lying in the bed.He complains of chest congestion states unable to cough up mucus.Has had mucinex this morning for cough.His chest X-ray ordered yesterday by Monina-vargas showed bilateral lung infiltrates.He denies any fever or chills.Has CBC,BMP labs pending.No shortness of breath,dyspnea or worsening edema reported.     Past Medical History:  Diagnosis Date  . Abdominal hernia   . Abnormality of gait     due to low back pain , uses  cane and walker  . Anemia of chronic disease    Dr. Alen Blew  . Arthritis   . Bilateral lower extremity edema    wears TED hose  . Cancer [facility])    prostate- treated with radiation  . Cardiac pacemaker in situ 10/11/2006   medtronic  . Chronic iron deficiency anemia oncologist/ hematoloist-- dr Alen Blew   treatment IV Iron infusions  . Chronic low back pain   . CKD (chronic kidney disease), stage III   . COPD with emphysema [facility])    pulmologist-  dr Elsworth Soho  . DDD (degenerative disc disease), lumbosacral   . Degenerative scoliosis   . Dyspnea    with exertion  . Full dentures   . GERD (gastroesophageal reflux disease)   . Gross hematuria   . Headache    history of migraines- "way in the past"  . Hereditary and idiopathic peripheral neuropathy    bilateral lower leg  . Hiatal hernia   . History of DVT of lower extremity 10/19/2011   chronic distal popliteal right lower extremity  . History of external beam radiation therapy 2008   prostate cancer  . History of pancreatitis 2013  . History of pneumothorax 09/2006   iatrogenic-- in setting cardiac pacemaker placement  . History of prostate cancer 2008   s/p  external radiation therapy  . Hypertension   . Injury of left ulnar nerve 06/2015   Dr. Jannifer Franklin   . Left carpal tunnel syndrome   . Lesion of bladder   . Macular  degeneration of both eyes   . Macular degeneration of right eye   . Mild mitral regurgitation   . Mild mitral regurgitation    and trace AI 07/22/08 echo and 10/2010  . Mild pulmonary hypertension ([place])   . Nocturia   . OSA on CPAP   . PAF (paroxysmal atrial fibrillation) ([place]) 10/2011   1st episode documented 09/ 2013 admission for gallstons/ pancreatitis and prior to cholecystectomy , went back in NSR without interventeion  . Pancreatitis 10/2011   gallstone induced  . Presence of permanent cardiac pacemaker   . Prostate cancer ([place])    Dr. Karsten Ro  . Sinus node dysfunction (HCC)      s/p  cardiac pacemaker placement 10-11-2006  . Sinus node dysfunction (HCC)    pacemaker  . Spinal stenosis    L2  . Venous insufficiency   . Wears glasses   . Wears hearing aid in both ears    Past Surgical History:  Procedure Laterality Date  . BALLOON DILATION N/A 01/14/2015   Procedure: BALLOON DILATION;  Surgeon: Garlan Fair, MD;  Location: [facility];  Service: Endoscopy;  Laterality: N/A;  . BALLOON DILATION N/A 10/12/2017   Procedure: BALLOON DILATION;  Surgeon: Otis Brace, MD;  Location: [place] ENDOSCOPY;  Service: Gastroenterology;  Laterality: N/A;  . BIOPSY  10/12/2017   Procedure: BIOPSY;  Surgeon: Otis Brace, MD;  Location: [place] ENDOSCOPY;  Service: Gastroenterology;;  . CARDIAC CATHETERIZATION  08-29-2000   [place]   no significant obstructive CAD, intact globle LV size and systolic function with regional wall motion abnormalities noted,  ?coronary spasm producing wall motion abnormality, elevated CPK and chest pain (ef 55%)  . CARDIAC PACEMAKER PLACEMENT  10-11-2006    dr Leonia Reeves   W/  ATRIAL LEAD REVISION 10-12-2006   (medtronic)  . CARDIOVASCULAR STRESS TEST  11-18-2011    dr Irish Lack   Low risk nuclear study w/ no ishemia/  normal wall motion,  post-stress ef 48% (LVEF 56% on prior study 2008)  . CARDIOVERSION N/A 04/02/2019   Procedure: CARDIOVERSION;  Surgeon: Josue Hector, MD;  Location: [facility] ENDOSCOPY;  Service: Cardiovascular;  Laterality: N/A;  . CARDIOVERSION N/A 07/24/2019   Procedure: CARDIOVERSION;  Surgeon: Dorothy Spark, MD;  Location: [facility] ENDOSCOPY;  Service: Cardiovascular;  Laterality: N/A;  . CARPAL TUNNEL RELEASE Left 11/14/2014   Procedure: CARPAL TUNNEL RELEASE LEFT THUMB;  Surgeon: Daryll Brod, MD;  Location: [place];  Service: Orthopedics;  Laterality: Left;  ANESTHESIA:  IV REGIONAL FAB  . CATARACT EXTRACTION W/ INTRAOCULAR LENS  IMPLANT, BILATERAL    . CHOLECYSTECTOMY  10/23/2011  . CHOLECYSTECTOMY  10/23/2011    Procedure: CHOLECYSTECTOMY;  Surgeon: Rolm Bookbinder, MD;  Location: [place];  Service: General;  Laterality: N/A;  with intraoperative cholangiogram  . COLONOSCOPY    . ESOPHAGOGASTRODUODENOSCOPY (EGD) WITH PROPOFOL N/A 01/14/2015   Procedure: ESOPHAGOGASTRODUODENOSCOPY (EGD) WITH PROPOFOL;  Surgeon: Garlan Fair, MD;  [facility];  Service: Endoscopy;  Laterality: N/A;  . ESOPHAGOGASTRODUODENOSCOPY (EGD) WITH PROPOFOL N/A 10/12/2017   Procedure: ESOPHAGOGASTRODUODENOSCOPY (EGD) WITH PROPOFOL;  Surgeon: Otis Brace, MD;  Location: [place];  Service: Gastroenterology;  Laterality: N/A;  . I & D EXTREMITY Left 10/20/2012   Procedure: LEFT LEG IRRIGATION AND DEBRIDEMENT, AND WOUND VAC APPLICATION;  Surgeon: Marianna Payment, MD;  [facility];  Service: Orthopedics;  Laterality: Left;  . I & D EXTREMITY Left 10/22/2012   Procedure: IRRIGATION AND DEBRIDEMENT EXTREMITY;  Surgeon: Marianna Payment,  MD;  [facility];  Service: Orthopedics;  Laterality: Left;  . INGUINAL HERNIA REPAIR Left yrs ago  . LAPAROSCOPIC ASSISTED VENTRAL HERNIA REPAIR  08-08-2009   dr Excell Seltzer   AND OPEN REPAIR RECURRENT LEFT INGUINAL HERNIA  . LAPAROSCOPIC INGUINAL HERNIA REPAIR Left 07-24-1999    dr Excell Seltzer  . OPEN VENTRAL HERNIA REPAIR /  LYSIS ADHESIONS  09-23-2010    dr Donne Hazel  . ORIF ELBOW FRACTURE Left 03/29/2015   Procedure: LEFT ELBOW OPEN REDUCTION INTERNAL FIXATION (ORIF) DISTAL HUMERUS FRACTURE WITH OLECRANON OSTEOTOMY AND ULNAR NERVE RELEASE;  Surgeon: Roseanne Kaufman, MD;  Location: [place];  Service: Orthopedics;  Laterality: Left;  . PPM GENERATOR CHANGEOUT N/A 02/10/2018   Procedure: PPM GENERATOR CHANGEOUT;  Surgeon: Deboraha Sprang, MD;  Location: [place] CV LAB;  Service: Cardiovascular;  Laterality: N/A;  . REPAIR SUPRAUMBILICAL HERNIA   16-02-930   dr Excell Seltzer  . SHOULDER ARTHROSCOPY WITH DISTAL CLAVICLE RESECTION Right 11-07-2007   dr Rhona Raider   [place] ACROMIOPLASTY  . SKIN SPLIT GRAFT Left 10/22/2012   Procedure: SKIN GRAFT SPLIT THICKNESS;  Surgeon: Marianna Payment, MD;  [facility];  Service: Orthopedics;  Laterality: Left;  . TEE WITHOUT CARDIOVERSION N/A 07/24/2019   Procedure: TRANSESOPHAGEAL ECHOCARDIOGRAM (TEE);  Surgeon: Dorothy Spark, MD;  Location: [facility] ENDOSCOPY;  Service: Cardiovascular;  Laterality: N/A;  . TOTAL KNEE ARTHROPLASTY Right 1990's  . TRANSTHORACIC ECHOCARDIOGRAM  06-12-2012   dr Irish Lack   ef 50-55%/ mild LAE/ mild MR and TR/  AV sclerosis without stenosis/  RVSP 80mmHg  . TRANSURETHRAL RESECTION OF BLADDER TUMOR N/A 07/15/2017   Procedure: CYSTOSCOPY TRANSURETHRAL RESECTION OF BLADDER TUMOR (TURBT);  Surgeon: Kathie Rhodes, MD;  Location: [facility];  Service: Urology;  Laterality: N/A;    No Known Allergies  Allergies as of 08/23/2019   No Known Allergies     Medication List       Accurate as of August 23, 2019  2:29 PM. If you have any questions, ask your nurse or doctor.        STOP taking these medications   oxyCODONE 5 MG immediate release tablet Commonly known as: Oxy IR/ROXICODONE Stopped by: Sandrea Hughs, NP     TAKE these medications   acetaminophen 650 MG CR tablet Commonly known as: TYLENOL Take 650 mg by mouth 2 (two) times daily.   acetaminophen 325 MG tablet Commonly known as: TYLENOL Take 650 mg by mouth every 6 (six) hours as needed (For Pain).   albuterol 108 (90 Base) MCG/ACT inhaler Commonly known as: VENTOLIN HFA Inhale 2 puffs into the lungs every 6 (six) hours as needed for wheezing or shortness of breath.   amiodarone 200 MG tablet Commonly known as: PACERONE Take 1 tablet (200 mg total) by mouth 2 (two) times daily.   Anoro Ellipta 62.5-25 MCG/INH Aepb Generic drug: umeclidinium-vilanterol Inhale 1 puff into the lungs daily.   CALCIUM-D PO Take 1 tablet by mouth every evening.   Eliquis 2.5 MG Tabs tablet Generic drug:  apixaban Take 2.5 mg by mouth 2 (two) times daily. For A-Fib   ferrous sulfate 325 (65 FE) MG tablet Take 650 mg by mouth daily with breakfast.   fluticasone 50 MCG/ACT nasal spray Commonly known as: FLONASE Place 1 spray into both nostrils daily as needed for allergies or rhinitis.   Folic Acid-Vit T5-TDD U20 2.5-25-1 MG Tabs tablet Commonly known as: FOLBEE Take 1 tablet by  mouth daily.   furosemide 20 MG tablet Commonly known as: LASIX Take 20 mg by mouth daily.   losartan 25 MG tablet Commonly known as: Cozaar Take 0.5 tablets (12.5 mg total) by mouth daily.   metoprolol succinate 25 MG 24 hr tablet Commonly known as: TOPROL-XL Take 1 tablet (25 mg total) by mouth daily.   Myrbetriq 50 MG Tb24 tablet Generic drug: mirabegron ER Take 50 mg by mouth daily.   NON FORMULARY DIET: REGULAR, NAS, HEART HEALTHY   oxymetazoline 0.05 % nasal spray Commonly known as: AFRIN Place 1 spray into both nostrils 2 (two) times daily as needed for congestion.   pantoprazole 40 MG tablet Commonly known as: Protonix Take 1 tablet (40 mg total) by mouth daily before breakfast.   polyethylene glycol 17 g packet Commonly known as: MiraLax Take 17 g by mouth daily as needed.   potassium chloride 10 MEQ tablet Commonly known as: KLOR-CON Take 10 mEq by mouth daily. FOR HYPOKALEMIA   predniSONE 10 MG tablet Commonly known as: DELTASONE Take 10 mg by mouth daily with breakfast.   PRESERVISION AREDS 2 PO Take 1 each by mouth in the morning and at bedtime.   psyllium 28 % packet Commonly known as: METAMUCIL SMOOTH TEXTURE Take 1 packet by mouth daily as needed. Mix with 8 oz liquid   senna-docusate 8.6-50 MG tablet Commonly known as: Senokot-S Take 1 tablet by mouth at bedtime as needed for mild constipation.   vitamin B-12 500 MCG tablet Commonly known as: CYANOCOBALAMIN Take 500 mcg by mouth. Three days a week   VITAMIN D3 PO Take 2,000 Units by mouth daily.   Voltaren 1  % Gel Generic drug: diclofenac Sodium Apply 2 g topically 2 (two) times daily. To right shoulder       Review of Systems  Constitutional: Negative for chills, fatigue and fever.  Respiratory: Positive for cough. Negative for chest tightness, shortness of breath and wheezing.   Cardiovascular: Positive for leg swelling. Negative for chest pain and palpitations.  Gastrointestinal: Negative for abdominal distention, abdominal pain, constipation, diarrhea, nausea and vomiting.  Musculoskeletal: Negative for gait problem and joint swelling.  Skin: Positive for wound. Negative for color change, pallor and rash.       Mid-back wound and skin tears to upper arm.   managed by wound care.  Neurological: Negative for dizziness, syncope, speech difficulty, light-headedness, numbness and headaches.  Hematological: Bruises/bleeds easily.  Psychiatric/Behavioral: Negative for agitation, behavioral problems and sleep disturbance. The patient is not nervous/anxious.     Immunization History  Administered Date(s) Administered  . Fluad Quad(high Dose 65+) 11/02/2018  . Influenza Split 12/01/2011, 11/28/2012, 12/05/2013, 11/02/2015, 10/16/2017  . Influenza, High Dose Seasonal PF 11/18/2014, 11/26/2015, 11/23/2016, 11/07/2017  . Influenza,inj,Quad PF,6+ Mos 10/16/2012  . Influenza-Unspecified 11/15/2013  . Moderna SARS-COVID-2 Vaccination 03/06/2019, 04/03/2019  . Pneumococcal Conjugate-13 05/14/2013, 06/03/2014  . Pneumococcal Polysaccharide-23 09/15/2005  . Pneumococcal-Unspecified 05/04/2013  . Td 11/28/2012  . Tdap 06/14/2007, 10/05/2012  . Zoster 04/20/2006, 02/16/2011  . Zoster Recombinat (Shingrix) 04/15/2017, 06/15/2017   Pertinent  Health Maintenance Due  Topic Date Due  . INFLUENZA VACCINE  09/16/2019  . PNA vac Low Risk Adult  Completed   Fall Risk  06/27/2019 12/30/2017  Falls in the past year? 1 1  Number falls in past yr: 1 1  Injury with Fall? 1 1  Comment bruises and skin  damage -  Follow up - Falls evaluation completed    Vitals:   08/23/19  1209  BP: 122/71  Pulse: 82  Resp: 18  Temp: 98 F (36.7 C)  SpO2: 94%  Weight: 164 lb (74.4 kg)  Height: 5\' 9"  (1.753 m)   Body mass index is 24.22 kg/m. Physical Exam Vitals reviewed.  Constitutional:      General: He is not in acute distress.    Appearance: He is not ill-appearing.  HENT:     Head: Normocephalic.     Mouth/Throat:     Mouth: Mucous membranes are moist.     Pharynx: Oropharynx is clear. No oropharyngeal exudate or posterior oropharyngeal erythema.  Eyes:     General: No scleral icterus.       Right eye: No discharge.        Left eye: No discharge.     Conjunctiva/sclera: Conjunctivae normal.     Pupils: Pupils are equal, round, and reactive to light.  Cardiovascular:     Rate and Rhythm: Normal rate. Rhythm irregular.     Pulses: Normal pulses.     Heart sounds: Normal heart sounds. No murmur heard.  No friction rub. No gallop.   Pulmonary:     Effort: Pulmonary effort is normal. No respiratory distress.     Breath sounds: Rales present. No rhonchi.  Chest:     Chest wall: No tenderness.  Abdominal:     General: Bowel sounds are normal. There is no distension.     Palpations: Abdomen is soft. There is no mass.     Tenderness: There is no abdominal tenderness. There is no right CVA tenderness, left CVA tenderness, guarding or rebound.  Musculoskeletal:        General: No swelling or tenderness.     Comments: bilateral lower extremities 1+ edema  Skin:    General: Skin is warm and dry.     Comments: Upper arms skin tears dressing dry,clean and intact.Mid-back wound without any signs of infection managed by wound care.   Neurological:     Mental Status: Mental status is at baseline.  Psychiatric:        Mood and Affect: Mood normal.        Speech: Speech normal.        Behavior: Behavior normal.        Thought Content: Thought content normal.    Past Surgical History:    Procedure Laterality Date  . BALLOON DILATION N/A 01/14/2015   Procedure: BALLOON DILATION;  Surgeon: Garlan Fair, MD;  Location: [facility];  Service: Endoscopy;  Laterality: N/A;  . BALLOON DILATION N/A 10/12/2017   Procedure: BALLOON DILATION;  Surgeon: Otis Brace, MD;  Location: [place] ENDOSCOPY;  Service: Gastroenterology;  Laterality: N/A;  . BIOPSY  10/12/2017   Procedure: BIOPSY;  Surgeon: Otis Brace, MD;  Location: [place] ENDOSCOPY;  Service: Gastroenterology;;  . CARDIAC CATHETERIZATION  08-29-2000   [place]   no significant obstructive CAD, intact globle LV size and systolic function with regional wall motion abnormalities noted,  ?coronary spasm producing wall motion abnormality, elevated CPK and chest pain (ef 55%)  . CARDIAC PACEMAKER PLACEMENT  10-11-2006    dr Leonia Reeves   W/  ATRIAL LEAD REVISION 10-12-2006   (medtronic)  . CARDIOVASCULAR STRESS TEST  11-18-2011    dr Irish Lack   Low risk nuclear study w/ no ishemia/  normal wall motion,  post-stress ef 48% (LVEF 56% on prior study 2008)  . CARDIOVERSION N/A 04/02/2019   Procedure: CARDIOVERSION;  Surgeon: Josue Hector, MD;  Location: [place];  Service:  Cardiovascular;  Laterality: N/A;  . CARDIOVERSION N/A 07/24/2019   Procedure: CARDIOVERSION;  Surgeon: Dorothy Spark, MD;  Location: [facility] ENDOSCOPY;  Service: Cardiovascular;  Laterality: N/A;  . CARPAL TUNNEL RELEASE Left 11/14/2014   Procedure: CARPAL TUNNEL RELEASE LEFT THUMB;  Surgeon: Daryll Brod, MD;  Location: [place];  Service: Orthopedics;  Laterality: Left;  ANESTHESIA:  IV REGIONAL FAB  . CATARACT EXTRACTION W/ INTRAOCULAR LENS  IMPLANT, BILATERAL    . CHOLECYSTECTOMY  10/23/2011  . CHOLECYSTECTOMY  10/23/2011   Procedure: CHOLECYSTECTOMY;  Surgeon: Rolm Bookbinder, MD;  Location: [place];  Service: General;  Laterality: N/A;  with intraoperative cholangiogram  . COLONOSCOPY    . ESOPHAGOGASTRODUODENOSCOPY (EGD) WITH PROPOFOL N/A  01/14/2015   Procedure: ESOPHAGOGASTRODUODENOSCOPY (EGD) WITH PROPOFOL;  Surgeon: Garlan Fair, MD;  [facility];  Service: Endoscopy;  Laterality: N/A;  . ESOPHAGOGASTRODUODENOSCOPY (EGD) WITH PROPOFOL N/A 10/12/2017   Procedure: ESOPHAGOGASTRODUODENOSCOPY (EGD) WITH PROPOFOL;  Surgeon: Otis Brace, MD;  Location: [place];  Service: Gastroenterology;  Laterality: N/A;  . I & D EXTREMITY Left 10/20/2012   Procedure: LEFT LEG IRRIGATION AND DEBRIDEMENT, AND WOUND VAC APPLICATION;  Surgeon: Marianna Payment, MD;  [facility];  Service: Orthopedics;  Laterality: Left;  . I & D EXTREMITY Left 10/22/2012   Procedure: IRRIGATION AND DEBRIDEMENT EXTREMITY;  Surgeon: Marianna Payment, MD;  [facility];  Service: Orthopedics;  Laterality: Left;  . INGUINAL HERNIA REPAIR Left yrs ago  . LAPAROSCOPIC ASSISTED VENTRAL HERNIA REPAIR  08-08-2009   dr Excell Seltzer   AND OPEN REPAIR RECURRENT LEFT INGUINAL HERNIA  . LAPAROSCOPIC INGUINAL HERNIA REPAIR Left 07-24-1999    dr Excell Seltzer  . OPEN VENTRAL HERNIA REPAIR /  LYSIS ADHESIONS  09-23-2010    dr Donne Hazel  . ORIF ELBOW FRACTURE Left 03/29/2015   Procedure: LEFT ELBOW OPEN REDUCTION INTERNAL FIXATION (ORIF) DISTAL HUMERUS FRACTURE WITH OLECRANON OSTEOTOMY AND ULNAR NERVE RELEASE;  Surgeon: Roseanne Kaufman, MD;  Location: [place];  Service: Orthopedics;  Laterality: Left;  . PPM GENERATOR CHANGEOUT N/A 02/10/2018   Procedure: PPM GENERATOR CHANGEOUT;  Surgeon: Deboraha Sprang, MD;  Location: [place] CV LAB;  Service: Cardiovascular;  Laterality: N/A;  . REPAIR SUPRAUMBILICAL HERNIA   09-38-1829   dr Excell Seltzer  . SHOULDER ARTHROSCOPY WITH DISTAL CLAVICLE RESECTION Right 11-07-2007   dr Rhona Raider   [place] ACROMIOPLASTY  . SKIN SPLIT GRAFT Left 10/22/2012   Procedure: SKIN GRAFT SPLIT THICKNESS;  Surgeon: Marianna Payment, MD;  [facility];  Service: Orthopedics;  Laterality: Left;  . TEE WITHOUT CARDIOVERSION  N/A 07/24/2019   Procedure: TRANSESOPHAGEAL ECHOCARDIOGRAM (TEE);  Surgeon: Dorothy Spark, MD;  Location: [facility] ENDOSCOPY;  Service: Cardiovascular;  Laterality: N/A;  . TOTAL KNEE ARTHROPLASTY Right 1990's  . TRANSTHORACIC ECHOCARDIOGRAM  06-12-2012   dr Irish Lack   ef 50-55%/ mild LAE/ mild MR and TR/  AV sclerosis without stenosis/  RVSP 58mmHg  . TRANSURETHRAL RESECTION OF BLADDER TUMOR N/A 07/15/2017   Procedure: CYSTOSCOPY TRANSURETHRAL RESECTION OF BLADDER TUMOR (TURBT);  Surgeon: Kathie Rhodes, MD;  Location: [facility];  Service: Urology;  Laterality: N/A;    Labs reviewed: Recent Labs    07/23/19 0322 07/23/19 0322 07/24/19 0350 07/24/19 0350 07/24/19 1540 07/24/19 1540 07/25/19 0321 07/31/19 0000 08/08/19 0000 08/16/19 0000 08/23/19 0000  NA 136   < > 139   < > 139   < > 140   < >  137 140 144  K 4.5   < > 4.9   < > 4.5   < > 4.3   < > 3.4 3.7 3.8  CL 96*   < > 100   < > 102   < > 102   < > 103 106 108  CO2 32   < > 29   < > 31   < > 30   < > 25* 26* 23*  GLUCOSE 105*   < > 87  --  135*  --  87  --   --   --   --   BUN 50*   < > 47*   < > 44*   < > 42*   < > 23* 22* 28*  CREATININE 1.42*   < > 1.39*   < > 1.23   < > 1.35*   < > 0.9 0.7 0.6  CALCIUM 8.3*   < > 8.4*   < > 8.0*   < > 7.9*   < > 8.3* 8.3* 8.4*  MG 2.1  --  2.1  --   --   --  2.0  --   --   --   --    < > = values in this interval not displayed.   Recent Labs    06/27/19 1221 07/10/19 1620  AST  --  38  ALT  --  30  ALKPHOS  --  81  BILITOT  --  1.9*  PROT 5.8* 5.7*  ALBUMIN  --  3.4*   Recent Labs    12/11/18 1108 07/15/19 0303 07/15/19 0303 07/16/19 0234 07/16/19 0234 07/19/19 1411 07/20/19 0405 07/23/19 0322 07/23/19 0322 07/24/19 0350 07/24/19 0350 07/25/19 0321 07/31/19 0000 07/31/19 0000 08/08/19 0000 08/23/19 0000 08/27/19 0000  WBC   < > 10.3   < > 9.3   < > 8.5   < > 7.8   < > 7.5   < > 6.6 7.0   < > 7.2 7.8 11.0  NEUTROABS  --  9.0*  --  8.5*  --  7.6  --    --   --   --   --   --   --   --   --   --   --   HGB   < > 13.1   < > 11.9*   < > 12.6*   < > 11.3*   < > 11.1*   < > 10.4* 11.4*   < > 10.1* 9.8* 10.5*  HCT   < > 43.1   < > 39.0   < > 40.2   < > 36.4*   < > 36.6*   < > 33.9* 35*   < > 30* 30* 32*  MCV  --  109.4*   < > 111.1*   < > 108.1*   < > 107.7*  --  111.2*  --  109.0*  --   --   --   --   --   PLT   < > 136*   < > 115*   < > 142*   < > 149*   < > 158   < > 147* 168  --  112*  --  189   < > = values in this interval not displayed.   Lab Results  Component Value Date   TSH 1.512 07/11/2019   Significant Diagnostic Results in last 30 days:  [place]  Result Date: 08/24/2019 Scheduled remote reviewed. Normal device function.  3 AT/AF (longest 6 minutes). Has known PAF. EMR med list includes amiodarone and Eliquis. Next remote 91 days. Felisa Bonier, RN, MSN   Assessment/Plan 1. Bilateral pulmonary infiltrates on chest x-ray Afebrile.bilateral lungs rales noted on auscultation. - encouraged to use incentive spirometer at bedside. - start on Doxycycline 100 mg tablet one by mouth twice daily x 10 days advised to take along with Florastor 250 mg capsule one by mouth twice daily x 10 days to prevent antibiotic associated diarrhea.  Has orders for CBC/diff and BMP due in the morning.   2. Bilateral leg edema Bilateral lower extremities 1+ edema.No abrupt weight gain noted on chart review.encourage to keep legs elevated while in bed.  Family/ staff Communication:Reviewed plan of care with patient and facility Nurse.   Labs/tests ordered: Has orders for CBC/diff and BMP due in the morning.

## 2019-08-24 ENCOUNTER — Ambulatory Visit (INDEPENDENT_AMBULATORY_CARE_PROVIDER_SITE_OTHER): Payer: Medicare Other | Admitting: *Deleted

## 2019-08-24 DIAGNOSIS — I495 Sick sinus syndrome: Secondary | ICD-10-CM

## 2019-08-24 LAB — CUP PACEART REMOTE DEVICE CHECK
Battery Remaining Longevity: 144 mo
Battery Voltage: 3.03 V
Brady Statistic AP VP Percent: 0.45 %
Brady Statistic AP VS Percent: 80.77 %
Brady Statistic AS VP Percent: 0.05 %
Brady Statistic AS VS Percent: 18.73 %
Brady Statistic RA Percent Paced: 81.53 %
Brady Statistic RV Percent Paced: 0.53 %
Date Time Interrogation Session: 20210709132113
Implantable Lead Implant Date: 20080826
Implantable Lead Implant Date: 20080826
Implantable Lead Location: 753859
Implantable Lead Location: 753860
Implantable Lead Model: 5076
Implantable Lead Model: 5076
Implantable Pulse Generator Implant Date: 20191227
Lead Channel Impedance Value: 247 Ohm
Lead Channel Impedance Value: 266 Ohm
Lead Channel Impedance Value: 323 Ohm
Lead Channel Impedance Value: 361 Ohm
Lead Channel Pacing Threshold Amplitude: 1 V
Lead Channel Pacing Threshold Amplitude: 1.875 V
Lead Channel Pacing Threshold Pulse Width: 0.4 ms
Lead Channel Pacing Threshold Pulse Width: 0.4 ms
Lead Channel Sensing Intrinsic Amplitude: 1.375 mV
Lead Channel Sensing Intrinsic Amplitude: 1.375 mV
Lead Channel Sensing Intrinsic Amplitude: 2.875 mV
Lead Channel Sensing Intrinsic Amplitude: 2.875 mV
Lead Channel Setting Pacing Amplitude: 2 V
Lead Channel Setting Pacing Amplitude: 2.5 V
Lead Channel Setting Pacing Pulse Width: 0.6 ms
Lead Channel Setting Sensing Sensitivity: 1.2 mV

## 2019-08-27 ENCOUNTER — Encounter: Payer: Self-pay | Admitting: Family

## 2019-08-27 ENCOUNTER — Non-Acute Institutional Stay (SKILLED_NURSING_FACILITY): Payer: Medicare Other | Admitting: Family

## 2019-08-27 DIAGNOSIS — R918 Other nonspecific abnormal finding of lung field: Secondary | ICD-10-CM | POA: Diagnosis not present

## 2019-08-27 DIAGNOSIS — M79643 Pain in unspecified hand: Secondary | ICD-10-CM

## 2019-08-27 DIAGNOSIS — L89120 Pressure ulcer of left upper back, unstageable: Secondary | ICD-10-CM | POA: Diagnosis not present

## 2019-08-27 DIAGNOSIS — T148XXA Other injury of unspecified body region, initial encounter: Secondary | ICD-10-CM

## 2019-08-27 DIAGNOSIS — M7989 Other specified soft tissue disorders: Secondary | ICD-10-CM

## 2019-08-27 DIAGNOSIS — L03312 Cellulitis of back [any part except buttock]: Secondary | ICD-10-CM | POA: Diagnosis not present

## 2019-08-27 LAB — CBC AND DIFFERENTIAL
HCT: 32 — AB (ref 41–53)
Hemoglobin: 10.5 — AB (ref 13.5–17.5)
Platelets: 189 (ref 150–399)
WBC: 11

## 2019-08-27 LAB — BASIC METABOLIC PANEL
Creatinine: 0.9 (ref 0.6–1.3)
Glucose: 87

## 2019-08-27 LAB — COMPREHENSIVE METABOLIC PANEL: Calcium: 8.4 — AB (ref 8.7–10.7)

## 2019-08-27 LAB — CBC: RBC: 3.31 — AB (ref 3.87–5.11)

## 2019-08-27 NOTE — Progress Notes (Signed)
Location:    [place].   Nursing Home Room Number: 921-J Place of Service:  SNF (31) Provider:  Marlowe Sax, NP   Patient Care Team: Lawerance Cruel, MD as PCP - General ([place]) Jettie Booze, MD as PCP - Cardiology (Cardiology) Deboraha Sprang, MD as PCP - Electrophysiology (Cardiology) Wyatt Portela, MD (Hematology and Oncology) Kathie Rhodes, MD as Attending Physician (Urology)  Extended Emergency Contact Information Primary Emergency Contact: Rodd,[name] Address: [address], [place] [address] [name] Phone: [phone] Mobile Phone: [phone] Relation: Son Secondary Emergency Contact: Grayden, Burley Mobile Phone: [phone] Relation: Son  Code Status:  DNR Goals of care: Advanced Directive information Advanced Directives 08/27/2019  Does Patient Have a Medical Advance Directive? Yes  Type of Advance Directive Out of facility DNR (pink MOST or yellow form)  Does patient want to make changes to medical advance directive? No - Patient declined  Copy of [place] in Chart? -  Would patient like information on creating a medical advance directive? -  Pre-existing out of facility DNR order (yellow form or pink MOST form) -     Chief Complaint  Patient presents with  . Acute Visit    Wound on Back.    HPI:  Pt is a 86 y.o. male seen today for an acute visit for evaluation of wound on upper back and right arm swelling.He is seen in his room today in bed with facility wound Nurse and patient's wife present at bedside.  Later spoke with patient's POA [name] present  and Dr.Josemanuel Steppe on brother's cell phone with facility Nurse present.Dr.Tramar request Doxycycline to be switched to Levaquin states prefers Levaquin over Doxycycline for treatment of pneumonia and wound infection.  Dr.Rivenbank states spoke with facility wound Nurse in regards to MRI to rule out  Osteomyelitis but relaxant due to patient's advance age and poor oral intake.He will require a I.V to be placed.He does not think at this will improve his condition.He will discuss patient's condition with the rest of the family then update provider. Addendum:  Mr.[name] called provider states have decided no MRI to be done.He would like to meet face to face with Dr.Reed to finalize their decision in patient's condition and care.Message has been send to Dr.Reed.    Past Medical History:  Diagnosis Date  . Abdominal hernia   . Abnormality of gait    due to low back pain , uses  cane and walker  . Anemia of chronic disease    Dr. Alen Blew  . Arthritis   . Bilateral lower extremity edema    wears TED hose  . Cancer [facility])    prostate- treated with radiation  . Cardiac pacemaker in situ 10/11/2006   medtronic  . Chronic iron deficiency anemia oncologist/ hematoloist-- dr Alen Blew   treatment IV Iron infusions  . Chronic low back pain   . CKD (chronic kidney disease), stage III   . COPD with emphysema [facility])    pulmologist-  dr Elsworth Soho  . DDD (degenerative disc disease), lumbosacral   . Degenerative scoliosis   . Dyspnea    with exertion  . Full dentures   . GERD (gastroesophageal reflux disease)   . Gross hematuria   . Headache    history of migraines- "way in the past"  . Hereditary and idiopathic peripheral neuropathy    bilateral lower leg  . Hiatal  hernia   . History of DVT of lower extremity 10/19/2011   chronic distal popliteal right lower extremity  . History of external beam radiation therapy 2008   prostate cancer  . History of pancreatitis 2013  . History of pneumothorax 09/2006   iatrogenic-- in setting cardiac pacemaker placement  . History of prostate cancer 2008   s/p  external radiation therapy  . Hypertension   . Injury of left ulnar nerve 06/2015   Dr. Jannifer Franklin   . Left carpal tunnel syndrome   . Lesion of bladder   . Macular degeneration of both eyes   . Macular  degeneration of right eye   . Mild mitral regurgitation   . Mild mitral regurgitation    and trace AI 07/22/08 echo and 10/2010  . Mild pulmonary hypertension ([place])   . Nocturia   . OSA on CPAP   . PAF (paroxysmal atrial fibrillation) ([place]) 10/2011   1st episode documented 09/ 2013 admission for gallstons/ pancreatitis and prior to cholecystectomy , went back in NSR without interventeion  . Pancreatitis 10/2011   gallstone induced  . Presence of permanent cardiac pacemaker   . Prostate cancer ([place])    Dr. Karsten Ro  . Sinus node dysfunction (HCC)    s/p  cardiac pacemaker placement 10-11-2006  . Sinus node dysfunction (HCC)    pacemaker  . Spinal stenosis    L2  . Venous insufficiency   . Wears glasses   . Wears hearing aid in both ears    Past Surgical History:  Procedure Laterality Date  . BALLOON DILATION N/A 01/14/2015   Procedure: BALLOON DILATION;  Surgeon: Garlan Fair, MD;  Location: [facility];  Service: Endoscopy;  Laterality: N/A;  . BALLOON DILATION N/A 10/12/2017   Procedure: BALLOON DILATION;  Surgeon: Otis Brace, MD;  Location: [place] ENDOSCOPY;  Service: Gastroenterology;  Laterality: N/A;  . BIOPSY  10/12/2017   Procedure: BIOPSY;  Surgeon: Otis Brace, MD;  Location: [place] [facility] ENDOSCOPY;  Service: Gastroenterology;;  . CARDIAC CATHETERIZATION  08-29-2000   [place]   no significant obstructive CAD, intact globle LV size and systolic function with regional wall motion abnormalities noted,  ?coronary spasm producing wall motion abnormality, elevated CPK and chest pain (ef 55%)  . CARDIAC PACEMAKER PLACEMENT  10-11-2006    dr Leonia Reeves   W/  ATRIAL LEAD REVISION 10-12-2006   (medtronic)  . CARDIOVASCULAR STRESS TEST  11-18-2011    dr Irish Lack   Low risk nuclear study w/ no ishemia/  normal wall motion,  post-stress ef 48% (LVEF 56% on prior study 2008)  . CARDIOVERSION N/A 04/02/2019   Procedure: CARDIOVERSION;  Surgeon: Josue Hector, MD;  Location: [facility] ENDOSCOPY;   Service: Cardiovascular;  Laterality: N/A;  . CARDIOVERSION N/A 07/24/2019   Procedure: CARDIOVERSION;  Surgeon: Dorothy Spark, MD;  Location: [facility] ENDOSCOPY;  Service: Cardiovascular;  Laterality: N/A;  . CARPAL TUNNEL RELEASE Left 11/14/2014   Procedure: CARPAL TUNNEL RELEASE LEFT THUMB;  Surgeon: Daryll Brod, MD;  Location: [place];  Service: Orthopedics;  Laterality: Left;  ANESTHESIA:  IV REGIONAL FAB  . CATARACT EXTRACTION W/ INTRAOCULAR LENS  IMPLANT, BILATERAL    . CHOLECYSTECTOMY  10/23/2011  . CHOLECYSTECTOMY  10/23/2011   Procedure: CHOLECYSTECTOMY;  Surgeon: Rolm Bookbinder, MD;  Location: [place];  Service: General;  Laterality: N/A;  with intraoperative cholangiogram  . COLONOSCOPY    . ESOPHAGOGASTRODUODENOSCOPY (EGD) WITH PROPOFOL N/A 01/14/2015   Procedure: ESOPHAGOGASTRODUODENOSCOPY (EGD) WITH PROPOFOL;  Surgeon: Ursula Alert  Wynetta Emery, MD;  Location: [facility];  Service: Endoscopy;  Laterality: N/A;  . ESOPHAGOGASTRODUODENOSCOPY (EGD) WITH PROPOFOL N/A 10/12/2017   Procedure: ESOPHAGOGASTRODUODENOSCOPY (EGD) WITH PROPOFOL;  Surgeon: Otis Brace, MD;  Location: [place];  Service: Gastroenterology;  Laterality: N/A;  . I & D EXTREMITY Left 10/20/2012   Procedure: LEFT LEG IRRIGATION AND DEBRIDEMENT, AND WOUND VAC APPLICATION;  Surgeon: Marianna Payment, MD;  Location: [facility];  Service: Orthopedics;  Laterality: Left;  . I & D EXTREMITY Left 10/22/2012   Procedure: IRRIGATION AND DEBRIDEMENT EXTREMITY;  Surgeon: Marianna Payment, MD;  Location: [facility];  Service: Orthopedics;  Laterality: Left;  . INGUINAL HERNIA REPAIR Left yrs ago  . LAPAROSCOPIC ASSISTED VENTRAL HERNIA REPAIR  08-08-2009   dr Excell Seltzer   AND OPEN REPAIR RECURRENT LEFT INGUINAL HERNIA  . LAPAROSCOPIC INGUINAL HERNIA REPAIR Left 07-24-1999    dr Excell Seltzer  . OPEN VENTRAL HERNIA REPAIR /  LYSIS ADHESIONS  09-23-2010    dr Donne Hazel  . ORIF ELBOW FRACTURE Left 03/29/2015   Procedure: LEFT ELBOW  OPEN REDUCTION INTERNAL FIXATION (ORIF) DISTAL HUMERUS FRACTURE WITH OLECRANON OSTEOTOMY AND ULNAR NERVE RELEASE;  Surgeon: Roseanne Kaufman, MD;  Location: [facility];  Service: Orthopedics;  Laterality: Left;  . PPM GENERATOR CHANGEOUT N/A 02/10/2018   Procedure: PPM GENERATOR CHANGEOUT;  Surgeon: Deboraha Sprang, MD;  Location: [place] [facility];  Service: Cardiovascular;  Laterality: N/A;  . REPAIR SUPRAUMBILICAL HERNIA   16-11-9602   dr Excell Seltzer  . SHOULDER ARTHROSCOPY WITH DISTAL CLAVICLE RESECTION Right 11-07-2007   dr Rhona Raider   [place] ACROMIOPLASTY  . SKIN SPLIT GRAFT Left 10/22/2012   Procedure: SKIN GRAFT SPLIT THICKNESS;  Surgeon: Marianna Payment, MD;  Location: [facility];  Service: Orthopedics;  Laterality: Left;  . TEE WITHOUT CARDIOVERSION N/A 07/24/2019   Procedure: TRANSESOPHAGEAL ECHOCARDIOGRAM (TEE);  Surgeon: Dorothy Spark, MD;  Location: [facility] ENDOSCOPY;  Service: Cardiovascular;  Laterality: N/A;  . TOTAL KNEE ARTHROPLASTY Right 1990's  . TRANSTHORACIC ECHOCARDIOGRAM  06-12-2012   dr Irish Lack   ef 50-55%/ mild LAE/ mild MR and TR/  AV sclerosis without stenosis/  RVSP 65mmHg  . TRANSURETHRAL RESECTION OF BLADDER TUMOR N/A 07/15/2017   Procedure: CYSTOSCOPY TRANSURETHRAL RESECTION OF BLADDER TUMOR (TURBT);  Surgeon: Kathie Rhodes, MD;  [facility];  Service: Urology;  Laterality: N/A;    No Known Allergies  Allergies as of 08/27/2019   No Known Allergies     Medication List       Accurate as of August 27, 2019  3:07 PM. If you have any questions, ask your nurse or doctor.        acetaminophen 650 MG CR tablet Commonly known as: TYLENOL Take 650 mg by mouth 2 (two) times daily.   acetaminophen 325 MG tablet Commonly known as: TYLENOL Take 650 mg by mouth every 6 (six) hours as needed (For Pain).   albuterol 108 (90 Base) MCG/ACT inhaler Commonly known as: VENTOLIN HFA Inhale 2 puffs into the lungs every 6 (six) hours as  needed for wheezing or shortness of breath.   amiodarone 200 MG tablet Commonly known as: PACERONE Take 1 tablet (200 mg total) by mouth 2 (two) times daily.   Anoro Ellipta 62.5-25 MCG/INH Aepb Generic drug: umeclidinium-vilanterol Inhale 1 puff into the lungs daily.   CALCIUM-D PO Take 1 tablet by mouth every evening.   Eliquis 2.5 MG Tabs tablet Generic drug: apixaban Take 2.5 mg by  mouth 2 (two) times daily. For A-Fib   ferrous sulfate 325 (65 FE) MG tablet Take 650 mg by mouth daily with breakfast.   fluticasone 50 MCG/ACT nasal spray Commonly known as: FLONASE Place 1 spray into both nostrils daily as needed for allergies or rhinitis.   Folic Acid-Vit E3-XVQ M08 2.5-25-1 MG Tabs tablet Commonly known as: FOLBEE Take 1 tablet by mouth daily.   furosemide 20 MG tablet Commonly known as: LASIX Take 20 mg by mouth daily.   losartan 25 MG tablet Commonly known as: Cozaar Take 0.5 tablets (12.5 mg total) by mouth daily.   metoprolol succinate 25 MG 24 hr tablet Commonly known as: TOPROL-XL Take 1 tablet (25 mg total) by mouth daily.   Myrbetriq 50 MG Tb24 tablet Generic drug: mirabegron ER Take 50 mg by mouth daily.   NON FORMULARY DIET: REGULAR, NAS, HEART HEALTHY   oxymetazoline 0.05 % nasal spray Commonly known as: AFRIN Place 1 spray into both nostrils 2 (two) times daily as needed for congestion.   pantoprazole 40 MG tablet Commonly known as: Protonix Take 1 tablet (40 mg total) by mouth daily before breakfast.   polyethylene glycol 17 g packet Commonly known as: MiraLax Take 17 g by mouth daily as needed.   potassium chloride 10 MEQ tablet Commonly known as: KLOR-CON Take 10 mEq by mouth daily. FOR HYPOKALEMIA   predniSONE 10 MG tablet Commonly known as: DELTASONE Take 10 mg by mouth daily with breakfast.   PRESERVISION AREDS 2 PO Take 1 each by mouth in the morning and at bedtime.   psyllium 28 % packet Commonly known as: METAMUCIL SMOOTH  TEXTURE Take 1 packet by mouth daily as needed. Mix with 8 oz liquid   saccharomyces boulardii 250 MG capsule Commonly known as: FLORASTOR Take 250 mg by mouth 2 (two) times daily.   senna-docusate 8.6-50 MG tablet Commonly known as: Senokot-S Take 1 tablet by mouth at bedtime as needed for mild constipation.   vitamin B-12 500 MCG tablet Commonly known as: CYANOCOBALAMIN Take 500 mcg by mouth. Three days a week   VITAMIN D3 PO Take 2,000 Units by mouth daily.   Voltaren 1 % Gel Generic drug: diclofenac Sodium Apply 2 g topically 2 (two) times daily. To right shoulder       Review of Systems  Constitutional: Positive for appetite change. Negative for chills, fatigue and fever.  HENT: Negative for congestion, postnasal drip, rhinorrhea, sinus pressure, sinus pain, sneezing, sore throat and trouble swallowing.   Respiratory: Positive for cough. Negative for chest tightness, shortness of breath and wheezing.   Cardiovascular: Positive for leg swelling. Negative for chest pain and palpitations.  Gastrointestinal: Negative for abdominal distention, abdominal pain, constipation, diarrhea, nausea and vomiting.  Genitourinary:       Incontinent   Musculoskeletal: Positive for gait problem. Negative for joint swelling and myalgias.  Skin: Positive for wound. Negative for color change, pallor and rash.       Mid -back pressure ulcer  Right upper arm,left arm and right leg skin tear managed by Facility wound care Nurse.   Neurological: Negative for dizziness, speech difficulty, light-headedness, numbness and headaches.  Hematological: Does not bruise/bleed easily.  Psychiatric/Behavioral: Positive for confusion. Negative for agitation and sleep disturbance. The patient is not nervous/anxious.     Immunization History  Administered Date(s) Administered  . Fluad Quad(high Dose 65+) 11/02/2018  . Influenza Split 12/01/2011, 11/28/2012, 12/05/2013, 11/02/2015, 10/16/2017  . Influenza,  High Dose Seasonal PF 11/18/2014, 11/26/2015,  11/23/2016, 11/07/2017  . Influenza,inj,Quad PF,6+ Mos 10/16/2012  . Influenza-Unspecified 11/15/2013  . Moderna SARS-COVID-2 Vaccination 03/06/2019, 04/03/2019  . Pneumococcal Conjugate-13 05/14/2013, 06/03/2014  . Pneumococcal Polysaccharide-23 09/15/2005  . Pneumococcal-Unspecified 05/04/2013  . Td 11/28/2012  . Tdap 06/14/2007, 10/05/2012  . Zoster 04/20/2006, 02/16/2011  . Zoster Recombinat (Shingrix) 04/15/2017, 06/15/2017   Pertinent  Health Maintenance Due  Topic Date Due  . INFLUENZA VACCINE  09/16/2019  . PNA vac Low Risk Adult  Completed   Fall Risk  06/27/2019 12/30/2017  Falls in the past year? 1 1  Number falls in past yr: 1 1  Injury with Fall? 1 1  Comment bruises and skin damage -  Follow up - Falls evaluation completed      Vitals:   08/27/19 1445  BP: (!) 159/73  Pulse: 61  Resp: 20  Temp: 97.8 F (36.6 C)  SpO2: 94%  Weight: 162 lb (73.5 kg)  Height: 5\' 9"  (1.753 m)   Body mass index is 23.92 kg/m. Physical Exam Vitals reviewed.  Constitutional:      General: He is not in acute distress.    Appearance: He is not ill-appearing.     Comments: Frail   HENT:     Mouth/Throat:     Mouth: Mucous membranes are moist.     Pharynx: Oropharynx is clear. No oropharyngeal exudate or posterior oropharyngeal erythema.  Eyes:     General: No scleral icterus.       Right eye: No discharge.        Left eye: No discharge.     Extraocular Movements: Extraocular movements intact.     Conjunctiva/sclera: Conjunctivae normal.     Pupils: Pupils are equal, round, and reactive to light.  Cardiovascular:     Rate and Rhythm: Normal rate and regular rhythm.     Pulses: Normal pulses.     Heart sounds: Normal heart sounds. No murmur heard.  No friction rub. No gallop.   Pulmonary:     Effort: Pulmonary effort is normal. No respiratory distress.     Breath sounds: Rales present. No wheezing or rhonchi.  Chest:      Chest wall: No tenderness.  Abdominal:     General: Bowel sounds are normal. There is no distension.     Palpations: Abdomen is soft. There is no mass.     Tenderness: There is no abdominal tenderness. There is no right CVA tenderness, guarding or rebound.  Musculoskeletal:        General: No swelling.     Right lower leg: Edema present.     Left lower leg: Edema present.     Comments: Requires assistance with transfer  Skin:    General: Skin is warm.     Coloration: Skin is not pale.     Findings: No bruising or rash.     Comments: Mid-upper back pressure ulcer with strong odor,wound bed with yellow necrotic skin tissue,increased purulent drainage and peri-wound 2-3 cm erythema noted.   Multiple skin tears noted on upper arms,elbow and right leg without any signs of infection  Neurological:     Mental Status: He is alert. Mental status is at baseline.     Gait: Gait abnormal.  Psychiatric:        Mood and Affect: Mood normal.        Speech: Speech normal.        Behavior: Behavior normal.        Thought Content: Thought content normal.  Cognition and Memory: Memory is impaired.    Labs reviewed: Recent Labs    07/23/19 0322 07/23/19 0322 07/24/19 0350 07/24/19 0350 07/24/19 1540 07/24/19 1540 07/25/19 0321 07/25/19 0321 07/31/19 0000 08/08/19 0000 08/16/19 0000  NA 136   < > 139   < > 139   < > 140  --  137 137 140  K 4.5   < > 4.9   < > 4.5   < > 4.3   < > 4.0 3.4 3.7  CL 96*   < > 100   < > 102   < > 102   < > 101 103 106  CO2 32   < > 29   < > 31   < > 30   < > 22 25* 26*  GLUCOSE 105*   < > 87  --  135*  --  87  --   --   --   --   BUN 50*   < > 47*   < > 44*   < > 42*  --  21 23* 22*  CREATININE 1.42*   < > 1.39*   < > 1.23   < > 1.35*  --  1.1 0.9 0.7  CALCIUM 8.3*   < > 8.4*   < > 8.0*   < > 7.9*   < > 8.7 8.3* 8.3*  MG 2.1  --  2.1  --   --   --  2.0  --   --   --   --    < > = values in this interval not displayed.   Recent Labs    06/27/19 1221  07/10/19 1620  AST  --  38  ALT  --  30  ALKPHOS  --  81  BILITOT  --  1.9*  PROT 5.8* 5.7*  ALBUMIN  --  3.4*   Recent Labs    07/15/19 0303 07/15/19 0303 07/16/19 0234 07/16/19 0234 07/19/19 1411 07/20/19 0405 07/23/19 0322 07/23/19 0322 07/24/19 0350 07/24/19 0350 07/25/19 0321 07/31/19 0000 08/08/19 0000  WBC 10.3   < > 9.3   < > 8.5   < > 7.8   < > 7.5   < > 6.6 7.0 7.2  NEUTROABS 9.0*  --  8.5*  --  7.6  --   --   --   --   --   --   --   --   HGB 13.1   < > 11.9*   < > 12.6*   < > 11.3*   < > 11.1*   < > 10.4* 11.4* 10.1*  HCT 43.1   < > 39.0   < > 40.2   < > 36.4*   < > 36.6*   < > 33.9* 35* 30*  MCV 109.4*   < > 111.1*   < > 108.1*   < > 107.7*  --  111.2*  --  109.0*  --   --   PLT 136*   < > 115*   < > 142*   < > 149*   < > 158   < > 147* 168 112*   < > = values in this interval not displayed.   Lab Results  Component Value Date   TSH 1.512 07/11/2019   Significant Diagnostic Results in last 30 days:  CUP PACEART REMOTE DEVICE CHECK  Result Date: 08/24/2019 Scheduled remote reviewed. Normal device function.  3 AT/AF (longest 6 minutes). Has  known PAF. EMR med list includes amiodarone and Eliquis. Next remote 91 days. Felisa Bonier, RN, MSN   Assessment/Plan 1. Bilateral pulmonary infiltrates on chest x-ray Afebrile. - Discontinue Doxycycline 100 mg tablet twice daily per POA Dr.Taflinger request then start Levaquin 500 mg tablet daily.  - continue on Probiotics.  - continue Incentive spirometer  2. Cellulitis of back except buttock Mid-upper back pressure ulcer with strong odor,wound bed with yellow necrotic skin tissue,increased purulent drainage and peri-wound 2-3 cm erythema noted.    3. Intermittent pain and swelling of hand Right upper arm and hand swollen and painful with movement.  - portable X-ray of shoulder,arm and hand 2-3 views rule out any fracture.  4. Pressure ulcer of left upper back, unstageable (HCC) Worsening pressure ulcer likes to  lie on the back.Facility Nurse to re-enforce change of position and encourage to lie on the sides instead of staying on the back most of the time.Limited due to his memory issues.continue current dressing changes with wound care management.  - Levaquin as above. -continue on pressure guard mattress. - continue on Ensure daily  - continue to encourage oral intake.  - Family to notify provider goal for management of pressure once family discuss.Mr.[name] would like to meet with Dr.Reed to discuss.  - continue current pain management.   5. Multiple skin tears - Wound care to continue dressing changes and monitor for signs of infection.    Family/ staff Communication: Reviewed plan of care with facility Nurse supervisor,patient,wife and Sons Mr.[name] and DR.Hyndman via phone in [place].   Labs/tests ordered:CBC,BMP ordered already.

## 2019-08-28 ENCOUNTER — Telehealth: Payer: Self-pay | Admitting: Family

## 2019-08-28 NOTE — Telephone Encounter (Signed)
Change oxycodone 5 mg tablet one by mouth twice daily for pain.Discontinue 30 minutes prior to dressing changes.

## 2019-08-28 NOTE — Progress Notes (Signed)
Remote pacemaker transmission.   

## 2019-08-31 ENCOUNTER — Non-Acute Institutional Stay (SKILLED_NURSING_FACILITY): Payer: Medicare Other | Admitting: Internal Medicine

## 2019-08-31 ENCOUNTER — Telehealth: Payer: Self-pay

## 2019-08-31 ENCOUNTER — Encounter: Payer: Self-pay | Admitting: Family Medicine

## 2019-08-31 ENCOUNTER — Encounter: Payer: Self-pay | Admitting: Internal Medicine

## 2019-08-31 DIAGNOSIS — I5041 Acute combined systolic (congestive) and diastolic (congestive) heart failure: Secondary | ICD-10-CM

## 2019-08-31 DIAGNOSIS — G8929 Other chronic pain: Secondary | ICD-10-CM | POA: Diagnosis not present

## 2019-08-31 DIAGNOSIS — J189 Pneumonia, unspecified organism: Secondary | ICD-10-CM | POA: Diagnosis not present

## 2019-08-31 DIAGNOSIS — R54 Age-related physical debility: Secondary | ICD-10-CM | POA: Diagnosis not present

## 2019-08-31 DIAGNOSIS — Z7189 Other specified counseling: Secondary | ICD-10-CM | POA: Diagnosis not present

## 2019-08-31 DIAGNOSIS — M25511 Pain in right shoulder: Secondary | ICD-10-CM | POA: Diagnosis not present

## 2019-08-31 DIAGNOSIS — J432 Centrilobular emphysema: Secondary | ICD-10-CM | POA: Diagnosis not present

## 2019-08-31 DIAGNOSIS — L89120 Pressure ulcer of left upper back, unstageable: Secondary | ICD-10-CM | POA: Diagnosis not present

## 2019-08-31 NOTE — Progress Notes (Signed)
Location:  [place] Room Number: [place] of Service:  SNF (31) Provider:  Ernst Cumpston L. Mariea Clonts, D.O., C.M.D.  Lawerance Cruel, MD  Patient Care Team: Lawerance Cruel, MD as PCP - General ([place]) Jettie Booze, MD as PCP - Cardiology (Cardiology) Deboraha Sprang, MD as PCP - Electrophysiology (Cardiology) Wyatt Portela, MD (Hematology and Oncology) Kathie Rhodes, MD as Attending Physician (Urology)  Extended Emergency Contact Information Primary Emergency Contact: Colburn,[name] Address: [place], [place] [address] [name] of [place] Phone: [phone] Mobile Phone: [phone] Relation: Son Secondary Emergency Contact: [place], [place] Mobile Phone: [phone] Relation: Son  Code Status:  DNR Goals of care: Advanced Directive information Advanced Directives 08/31/2019  Does Patient Have a Medical Advance Directive? Yes  Type of Advance Directive Out of facility DNR (pink MOST or yellow form)  Does patient want to make changes to medical advance directive? No - Patient declined  Copy of [place] in Chart? -  Would patient like information on creating a medical advance directive? -  Pre-existing out of facility DNR order (yellow form or pink MOST form) Pink MOST/Yellow Form most recent copy in chart - Physician notified to receive inpatient order   Chief Complaint  Patient presents with  . Acute Visit    advance care planning    HPI:  Pt is a 86 y.o. male seen today for an acute visit for advance care planning.  I met with pt's son, [name], today, who lives locally, and pt's wife, and we had his other son, Clair Gulling, who is a physician in [facility], on the phone.  We discussed Mr. Bair current status.    Active concerns:Nosocomial pneumonia:  Identified on xrays resulted 7/8 due to cough, congestion.  pCXR with bilateral infiltrates.  He was started on doxycycline by NP.   7/12, RP concerned about lack of improvement and inadequate coverage so requested abx change.  Levaquin was started.    Thoracic pressure injury:   notes from wound care:  "Pressure Ulcer//Mid Upper Back//...UNSTAGEABLE PRESSURE INJURY TO MID-BACK: measures 8.6 x 2.5 x 0.2cm, slough tissue to wound bed. Moderate amount of seropurulent exudate noted. Periwound intact. No periwound maceration noted. Denies pain/discomfort. Lupita Leash, PA-C QSM in facility on wound rounds 08/27/2019, note states ".Mr. Collard 34 [place] with sig PMhx of localized edema, muscle weakness, unsteadiness on feet, CKD3, history of falling, iron deficiency anemia, and vascular insufficiency who is being seen for wound care. Back wound: larger vs last assessment with increased drainage (seropurulent drainage). There is concern for infection. Recommend PCP strongly consider placing the patient on antibiotics. If no improvement, consider MRI to rule out osteomyelitis as wound is very close to spine (facility will speak to family to see if they prefer to go ahead with MRI now). Periwound intact. No periwound maceration. No wound pain. +yellow necrotic tissue. Wound debrided using a No 15 blade after application of hurricane spray. No complications with procedure. Will change treatment to moistened dakins gauze BID+ dry dressing + air loss mattress + offloading and frequent repositioning. Treatment discussed with facility treatment team. follow up in one week. Wound Back: tissue depth changed because bone palpable after debridement. Facility has redefined wound as pressure/unstageable due to family history being clarified. Family states pressure wound developed while patient was in the hospital..." Order in place to cleanse with NS, pat dry, apply Santyl and  cover with foam dressing QD. Air overlay in place. RR, Ira Dougher, Ashby Dawes and Jeramey Lanuza updated and agrees with treatment plan of care. Dr. Mariea Clonts updated in facility."     I spoke with wound care nurse today and she indicates that erythema surrounding wound has improved since abx initiated.  Remains deep with bone able to be probed.  When discussed with [name], he indicates that they have decided that an MRI is not a good idea because the wound is unlikely to heal.  Explained reasons why wound healing is unlikely:  Patient unable to reposition himself, nutritional status poor, he can only rest on his left side due to right shoulder immobility and pain, edema.    Discussed overall goals of care with [name] and some with Clair Gulling.  They understand that their dad is unlikely to recover from his current status.  They seem to agree to avoid hospitalizations for him and continue current po antibiotics here for pneumonia at [facility], wound care, and comfort measures.  MOST was reviewed with [name] and anticipated completion is as follows:  DNR, comfort measures, short-term abx, no fluids, no tube feeding; however, Clair Gulling is coming to see his dad and meet with his brother and mother for more decision making.  We discussed hospice as a next step after rehab stay ends assuming he does not make improvements.  They are agreeable to this in the SNF environment but need guidance from social work about the financial implications.    Past Medical History:  Diagnosis Date  . Abdominal hernia   . Abnormality of gait    due to low back pain , uses  cane and walker  . Anemia of chronic disease    Dr. Alen Blew  . Arthritis   . Bilateral lower extremity edema    wears TED hose  . Cancer [facility])    prostate- treated with radiation  . Cardiac pacemaker in situ 10/11/2006   medtronic  . Chronic iron deficiency anemia oncologist/ hematoloist-- dr Alen Blew   treatment IV Iron infusions  . Chronic low back pain   . CKD (chronic kidney disease), stage III   . COPD with emphysema [facility])    pulmologist-  dr Elsworth Soho  . DDD (degenerative disc disease), lumbosacral   . Degenerative scoliosis   . Dyspnea     with exertion  . Full dentures   . GERD (gastroesophageal reflux disease)   . Gross hematuria   . Headache    history of migraines- "way in the past"  . Hereditary and idiopathic peripheral neuropathy    bilateral lower leg  . Hiatal hernia   . History of DVT of lower extremity 10/19/2011   chronic distal popliteal right lower extremity  . History of external beam radiation therapy 2008   prostate cancer  . History of pancreatitis 2013  . History of pneumothorax 09/2006   iatrogenic-- in setting cardiac pacemaker placement  . History of prostate cancer 2008   s/p  external radiation therapy  . Hypertension   . Injury of left ulnar nerve 06/2015   Dr. Jannifer Franklin   . Left carpal tunnel syndrome   . Lesion of bladder   . Macular degeneration of both eyes   . Macular degeneration of right eye   . Mild mitral regurgitation   . Mild mitral regurgitation    and trace AI 07/22/08 echo and 10/2010  . Mild pulmonary hypertension ([place])   . Nocturia   . OSA on CPAP   .  PAF (paroxysmal atrial fibrillation) ([place]) 10/2011   1st episode documented 09/ 2013 admission for gallstons/ pancreatitis and prior to cholecystectomy , went back in NSR without interventeion  . Pancreatitis 10/2011   gallstone induced  . Presence of permanent cardiac pacemaker   . Prostate cancer ([place])    Dr. Karsten Ro  . Sinus node dysfunction (HCC)    s/p  cardiac pacemaker placement 10-11-2006  . Sinus node dysfunction (HCC)    pacemaker  . Spinal stenosis    L2  . Venous insufficiency   . Wears glasses   . Wears hearing aid in both ears    Past Surgical History:  Procedure Laterality Date  . BALLOON DILATION N/A 01/14/2015   Procedure: BALLOON DILATION;  Surgeon: Garlan Fair, MD;  Location: [facility];  Service: Endoscopy;  Laterality: N/A;  . BALLOON DILATION N/A 10/12/2017   Procedure: BALLOON DILATION;  Surgeon: Otis Brace, MD;  Location: [place] ENDOSCOPY;  Service: Gastroenterology;  Laterality:  N/A;  . BIOPSY  10/12/2017   Procedure: BIOPSY;  Surgeon: Otis Brace, MD;  Location: [place] [facility] ENDOSCOPY;  Service: Gastroenterology;;  . CARDIAC CATHETERIZATION  08-29-2000   [place]   no significant obstructive CAD, intact globle LV size and systolic function with regional wall motion abnormalities noted,  ?coronary spasm producing wall motion abnormality, elevated CPK and chest pain (ef 55%)  . CARDIAC PACEMAKER PLACEMENT  10-11-2006    dr Leonia Reeves   W/  ATRIAL LEAD REVISION 10-12-2006   (medtronic)  . CARDIOVASCULAR STRESS TEST  11-18-2011    dr Irish Lack   Low risk nuclear study w/ no ishemia/  normal wall motion,  post-stress ef 48% (LVEF 56% on prior study 2008)  . CARDIOVERSION N/A 04/02/2019   Procedure: CARDIOVERSION;  Surgeon: Josue Hector, MD;  Location: [facility] ENDOSCOPY;  Service: Cardiovascular;  Laterality: N/A;  . CARDIOVERSION N/A 07/24/2019   Procedure: CARDIOVERSION;  Surgeon: Dorothy Spark, MD;  Location: [facility] ENDOSCOPY;  Service: Cardiovascular;  Laterality: N/A;  . CARPAL TUNNEL RELEASE Left 11/14/2014   Procedure: CARPAL TUNNEL RELEASE LEFT THUMB;  Surgeon: Daryll Brod, MD;  Location: [place];  Service: Orthopedics;  Laterality: Left;  ANESTHESIA:  IV REGIONAL FAB  . CATARACT EXTRACTION W/ INTRAOCULAR LENS  IMPLANT, BILATERAL    . CHOLECYSTECTOMY  10/23/2011  . CHOLECYSTECTOMY  10/23/2011   Procedure: CHOLECYSTECTOMY;  Surgeon: Rolm Bookbinder, MD;  Location: [place];  Service: General;  Laterality: N/A;  with intraoperative cholangiogram  . COLONOSCOPY    . ESOPHAGOGASTRODUODENOSCOPY (EGD) WITH PROPOFOL N/A 01/14/2015   Procedure: ESOPHAGOGASTRODUODENOSCOPY (EGD) WITH PROPOFOL;  Surgeon: Garlan Fair, MD;  [facility];  Service: Endoscopy;  Laterality: N/A;  . ESOPHAGOGASTRODUODENOSCOPY (EGD) WITH PROPOFOL N/A 10/12/2017   Procedure: ESOPHAGOGASTRODUODENOSCOPY (EGD) WITH PROPOFOL;  Surgeon: Otis Brace, MD;  Location: [place];  Service:  Gastroenterology;  Laterality: N/A;  . I & D EXTREMITY Left 10/20/2012   Procedure: LEFT LEG IRRIGATION AND DEBRIDEMENT, AND WOUND VAC APPLICATION;  Surgeon: Marianna Payment, MD;  Location: [facility] ORS;  Service: Orthopedics;  Laterality: Left;  . I & D EXTREMITY Left 10/22/2012   Procedure: IRRIGATION AND DEBRIDEMENT EXTREMITY;  Surgeon: Marianna Payment, MD;  [facility];  Service: Orthopedics;  Laterality: Left;  . INGUINAL HERNIA REPAIR Left yrs ago  . LAPAROSCOPIC ASSISTED VENTRAL HERNIA REPAIR  08-08-2009   dr Excell Seltzer   AND OPEN REPAIR RECURRENT LEFT INGUINAL HERNIA  . LAPAROSCOPIC INGUINAL HERNIA REPAIR Left 07-24-1999    dr Excell Seltzer  .  OPEN VENTRAL HERNIA REPAIR /  LYSIS ADHESIONS  09-23-2010    dr Donne Hazel  . ORIF ELBOW FRACTURE Left 03/29/2015   Procedure: LEFT ELBOW OPEN REDUCTION INTERNAL FIXATION (ORIF) DISTAL HUMERUS FRACTURE WITH OLECRANON OSTEOTOMY AND ULNAR NERVE RELEASE;  Surgeon: Roseanne Kaufman, MD;  Location: [place];  Service: Orthopedics;  Laterality: Left;  . PPM GENERATOR CHANGEOUT N/A 02/10/2018   Procedure: PPM GENERATOR CHANGEOUT;  Surgeon: Deboraha Sprang, MD;  Location: [place] CV LAB;  Service: Cardiovascular;  Laterality: N/A;  . REPAIR SUPRAUMBILICAL HERNIA   08-08-7626   dr Excell Seltzer  . SHOULDER ARTHROSCOPY WITH DISTAL CLAVICLE RESECTION Right 11-07-2007   dr Rhona Raider   [place] ACROMIOPLASTY  . SKIN SPLIT GRAFT Left 10/22/2012   Procedure: SKIN GRAFT SPLIT THICKNESS;  Surgeon: Marianna Payment, MD;  [facility];  Service: Orthopedics;  Laterality: Left;  . TEE WITHOUT CARDIOVERSION N/A 07/24/2019   Procedure: TRANSESOPHAGEAL ECHOCARDIOGRAM (TEE);  Surgeon: Dorothy Spark, MD;  Location: [facility] ENDOSCOPY;  Service: Cardiovascular;  Laterality: N/A;  . TOTAL KNEE ARTHROPLASTY Right 1990's  . TRANSTHORACIC ECHOCARDIOGRAM  06-12-2012   dr Irish Lack   ef 50-55%/ mild LAE/ mild MR and TR/  AV sclerosis without stenosis/  RVSP 61mHg  .  TRANSURETHRAL RESECTION OF BLADDER TUMOR N/A 07/15/2017   Procedure: CYSTOSCOPY TRANSURETHRAL RESECTION OF BLADDER TUMOR (TURBT);  Surgeon: OKathie Rhodes MD;  [facility]  Service: Urology;  Laterality: N/A;    No Known Allergies  Outpatient Encounter Medications as of 08/31/2019  Medication Sig  . acetaminophen (TYLENOL) 325 MG tablet Take 650 mg by mouth every 6 (six) hours as needed (For Pain).  .Marland Kitchenacetaminophen (TYLENOL) 650 MG CR tablet Take 650 mg by mouth 2 (two) times daily.  .Marland Kitchenamiodarone (PACERONE) 200 MG tablet Take 200 mg by mouth daily.  .Marland Kitchenapixaban (ELIQUIS) 2.5 MG TABS tablet Take 2.5 mg by mouth 2 (two) times daily. For A-Fib  . Calcium Carbonate-Vitamin D (CALCIUM-D PO) Take 1 tablet by mouth every evening.   . Cholecalciferol (VITAMIN D3 PO) Take 2,000 Units by mouth daily.  . diclofenac Sodium (VOLTAREN) 1 % GEL Apply 2 g topically 2 (two) times daily. To right shoulder  . ferrous sulfate 325 (65 FE) MG tablet Take 650 mg by mouth daily with breakfast.  . fluticasone (FLONASE) 50 MCG/ACT nasal spray Place 1 spray into both nostrils daily as needed for allergies or rhinitis.  . Folic Acid-Vit BB1-DVVBO16(FOLBEE) 2.5-25-1 MG TABS tablet Take 1 tablet by mouth daily.  . furosemide (LASIX) 20 MG tablet Take 20 mg by mouth daily.   .Marland Kitchenlevofloxacin (LEVAQUIN) 500 MG tablet Take 500 mg by mouth daily.  .Marland Kitchenlosartan (COZAAR) 25 MG tablet Take 25 mg by mouth daily.  . metoprolol succinate (TOPROL-XL) 25 MG 24 hr tablet Take 1 tablet (25 mg total) by mouth daily.  . mirabegron ER (MYRBETRIQ) 50 MG TB24 tablet Take 50 mg by mouth daily.  . Multiple Vitamins-Minerals (PRESERVISION AREDS 2 PO) Take 1 each by mouth in the morning and at bedtime.  . NON FORMULARY DIET: REGULAR, NAS, HEART HEALTHY  . oxymetazoline (AFRIN) 0.05 % nasal spray Place 1 spray into both nostrils 2 (two) times daily as needed for congestion.  . pantoprazole (PROTONIX) 40 MG tablet Take 1  tablet (40 mg total) by mouth daily before breakfast.  . polyethylene glycol (MIRALAX) 17 g packet Take 17 g by mouth daily as needed.  .Marland Kitchen  potassium chloride (KLOR-CON) 10 MEQ tablet Take 10 mEq by mouth daily. FOR HYPOKALEMIA  . predniSONE (DELTASONE) 10 MG tablet Take 10 mg by mouth daily with breakfast.  . psyllium (METAMUCIL SMOOTH TEXTURE) 28 % packet Take 1 packet by mouth daily as needed. Mix with 8 oz liquid  . saccharomyces boulardii (FLORASTOR) 250 MG capsule Take 250 mg by mouth 2 (two) times daily.  Marland Kitchen senna-docusate (SENOKOT-S) 8.6-50 MG tablet Take 1 tablet by mouth at bedtime as needed for mild constipation.  Marland Kitchen umeclidinium-vilanterol (ANORO ELLIPTA) 62.5-25 MCG/INH AEPB Inhale 1 puff into the lungs daily.   . vitamin B-12 (CYANOCOBALAMIN) 500 MCG tablet Take 500 mcg by mouth. Three days a week  . [DISCONTINUED] albuterol (VENTOLIN HFA) 108 (90 Base) MCG/ACT inhaler Inhale 2 puffs into the lungs every 6 (six) hours as needed for wheezing or shortness of breath.  . [DISCONTINUED] amiodarone (PACERONE) 200 MG tablet Take 1 tablet (200 mg total) by mouth 2 (two) times daily.  . [DISCONTINUED] losartan (COZAAR) 25 MG tablet Take 0.5 tablets (12.5 mg total) by mouth daily.   No facility-administered encounter medications on file as of 08/31/2019.    Review of Systems  Constitutional: Positive for malaise/fatigue. Negative for chills and fever.  HENT: Positive for congestion and hearing loss. Negative for sore throat.   Eyes: Negative for blurred vision.  Respiratory: Positive for cough, sputum production and shortness of breath. Negative for wheezing.   Cardiovascular: Positive for orthopnea, leg swelling and PND. Negative for chest pain and palpitations.  Gastrointestinal: Negative for abdominal pain and constipation.  Genitourinary: Negative for dysuria.  Musculoskeletal: Positive for back pain and joint pain. Negative for falls.  Neurological: Negative for dizziness and loss of  consciousness.  Endo/Heme/Allergies: Bruises/bleeds easily.  Psychiatric/Behavioral: Positive for memory loss.       More confusion recently    Immunization History  Administered Date(s) Administered  . Fluad Quad(high Dose 65+) 11/02/2018  . Influenza Split 12/01/2011, 11/28/2012, 12/05/2013, 11/02/2015, 10/16/2017  . Influenza, High Dose Seasonal PF 11/18/2014, 11/26/2015, 11/23/2016, 11/07/2017  . Influenza,inj,Quad PF,6+ Mos 10/16/2012  . Influenza-Unspecified 11/15/2013  . Moderna SARS-COVID-2 Vaccination 03/06/2019, 04/03/2019  . Pneumococcal Conjugate-13 05/14/2013, 06/03/2014  . Pneumococcal Polysaccharide-23 09/15/2005  . Pneumococcal-Unspecified 05/04/2013  . Td 11/28/2012  . Tdap 06/14/2007, 10/05/2012  . Zoster 04/20/2006, 02/16/2011  . Zoster Recombinat (Shingrix) 04/15/2017, 06/15/2017   Pertinent  Health Maintenance Due  Topic Date Due  . INFLUENZA VACCINE  09/16/2019  . PNA vac Low Risk Adult  Completed   Fall Risk  06/27/2019 12/30/2017  Falls in the past year? 1 1  Number falls in past yr: 1 1  Injury with Fall? 1 1  Comment bruises and skin damage -  Follow up - Falls evaluation completed   Functional Status Survey:    Vitals:   08/31/19 1524  BP: 127/74  Pulse: 81  Temp: 97.6 F (36.4 C)  Weight: 162 lb (73.5 kg)  Height: 5' 9"  (1.753 m)   Body mass index is 23.92 kg/m. Physical Exam Vitals reviewed.  Constitutional:      Comments: Pleasant, confused  Cardiovascular:     Rate and Rhythm: Rhythm irregular.  Pulmonary:     Breath sounds: Rhonchi and rales present.  Abdominal:     General: Bowel sounds are normal.  Musculoskeletal:     Right lower leg: Edema present.     Left lower leg: Edema present.     Comments: Severe edema of both legs and right arm  which is resting on a wedge and wrapped due to edema and pain in joints  Neurological:     Mental Status: He is alert.     Motor: Weakness present.     Comments: Confused, HOH    Psychiatric:        Mood and Affect: Mood normal.        Behavior: Behavior normal.     Labs reviewed: Recent Labs    07/23/19 0322 07/23/19 0322 07/24/19 0350 07/24/19 0350 07/24/19 1540 07/24/19 1540 07/25/19 0321 07/31/19 0000 08/08/19 0000 08/08/19 0000 08/16/19 0000 08/23/19 0000 08/27/19 0000  NA 136   < > 139   < > 139   < > 140   < > 137  --  140 144  --   K 4.5   < > 4.9   < > 4.5   < > 4.3   < > 3.4  --  3.7 3.8  --   CL 96*   < > 100   < > 102   < > 102   < > 103  --  106 108  --   CO2 32   < > 29   < > 31   < > 30   < > 25*  --  26* 23*  --   GLUCOSE 105*   < > 87  --  135*  --  87  --   --   --   --   --   --   BUN 50*   < > 47*   < > 44*   < > 42*   < > 23*  --  22* 28*  --   CREATININE 1.42*   < > 1.39*   < > 1.23   < > 1.35*   < > 0.9   < > 0.7 0.6 0.9  CALCIUM 8.3*   < > 8.4*   < > 8.0*   < > 7.9*   < > 8.3*   < > 8.3* 8.4* 8.4*  MG 2.1  --  2.1  --   --   --  2.0  --   --   --   --   --   --    < > = values in this interval not displayed.   Recent Labs    06/27/19 1221 07/10/19 1620  AST  --  38  ALT  --  30  ALKPHOS  --  81  BILITOT  --  1.9*  PROT 5.8* 5.7*  ALBUMIN  --  3.4*   Recent Labs    12/11/18 1108 07/15/19 0303 07/15/19 0303 07/16/19 0234 07/16/19 0234 07/19/19 1411 07/20/19 0405 07/23/19 0322 07/23/19 0322 07/24/19 0350 07/24/19 0350 07/25/19 0321 07/31/19 0000 07/31/19 0000 08/08/19 0000 08/23/19 0000 08/27/19 0000  WBC   < > 10.3   < > 9.3   < > 8.5   < > 7.8   < > 7.5   < > 6.6 7.0   < > 7.2 7.8 11.0  NEUTROABS  --  9.0*  --  8.5*  --  7.6  --   --   --   --   --   --   --   --   --   --   --   HGB   < > 13.1   < > 11.9*   < > 12.6*   < > 11.3*   < > 11.1*   < >  10.4* 11.4*   < > 10.1* 9.8* 10.5*  HCT   < > 43.1   < > 39.0   < > 40.2   < > 36.4*   < > 36.6*   < > 33.9* 35*   < > 30* 30* 32*  MCV  --  109.4*   < > 111.1*   < > 108.1*   < > 107.7*  --  111.2*  --  109.0*  --   --   --   --   --   PLT   < > 136*   < >  115*   < > 142*   < > 149*   < > 158   < > 147* 168  --  112*  --  189   < > = values in this interval not displayed.   Lab Results  Component Value Date   TSH 1.512 07/11/2019   No results found for: HGBA1C No results found for: CHOL, HDL, LDLCALC, LDLDIRECT, TRIG, CHOLHDL  Significant Diagnostic Results in last 30 days:  CUP PACEART REMOTE DEVICE CHECK  Result Date: 08/24/2019 Scheduled remote reviewed. Normal device function.  3 AT/AF (longest 6 minutes). Has known PAF. EMR med list includes amiodarone and Eliquis. Next remote 91 days. Felisa Bonier, RN, MSN   Assessment/Plan 1. ACP (advance care planning) -60 mins spent with [name] in person, Clair Gulling on phone intermittently, and pt's wife today  -introduced MOST and hospice -discussed expectations as above in hpi -will complete MOST officially Tuesday   2. Pressure ulcer of left upper back, unstageable ([place]) -do not expect resolution -cont levaquin therapy for the pneumonia which may help with some coverage here -IV abx would be required for full treatment with suspected osteomyelitis given bone can be probed -fortunately, the surrounding erythema has improved per wound care nurse with her assessment today  3. Chronic right shoulder pain -cont scheduled oxycodone therapy  4. Acute combined systolic and diastolic heart failure ([place]) -continues to have terrific edema/some degree of anasarca, some due to steroids -cont oxygen, daily lasix 109m, does not appear dyspneic at rest  5. HCAP (healthcare-associated pneumonia) -complete 7 day course of levaquin thru 7/18 and monitor progress -cont mucinex -add duonebs  6. Centrilobular emphysema (HCC) -cont anoro, prednisone, albuterol hfa, add duonebs q6h while awake  7. Frailty syndrome in geriatric patient -underlying and contributory -intake not good, weak, not making progress and now with HCAP and pressure injury that has progressed to possible osteomyelitis -prognosis roughly  weeks   Family/ staff Communication: 60 mins spent with family today, also met with Kasie, wound care nurse; another 40 mins spent on eval of pt and his medical records  Labs/tests ordered:  No new added today  Gertrue Willette L. Tysin Salada, D.O. [facility] [address] [place] [id number] Phone (Mon-Fri 8am-5pm):  [phone] Call:  [phone]& follow prompts after 5pm & weekends Office Phone:  [phone] Fax:  [phone]

## 2019-08-31 NOTE — Telephone Encounter (Signed)
-----   Message from Gayland Curry, DO sent at 08/31/2019  5:28 AM EDT ----- Regarding: RE: Meeting with son Randell Patient,   Can you please call [name] and arrange a time sometime between noon and 3:30pm this afternoon to meet for advance care planning?  See Dinah's message below.     Thank you,   TReed ----- Message ----- From: Sandrea Hughs, NP Sent: 08/30/2019   7:27 PM EDT To: Gayland Curry, DO, Tanna Savoy, CMA Subject: Meeting with son                               Dr.Reed, Patient's son Cori Henningsen would like to meet with you tomorrow in the facility.They want to make decision on the patient's treatment due to recent Pneumonia,worsening pressure ulcer on the mid-back,poor oral intake and his advance age.His other son is a Sport and exercise psychologist and lives in [place].They're leaning towards comfort care but wants to talk to you prior to making decision.Dr.Jim was supposed to talk with the sister and their mother( retired Marine scientist). Please call to arrange time for meeting.Mr.[name] said it takes about 30 minutes to [place] from his work. Thanks  [employer]

## 2019-08-31 NOTE — Telephone Encounter (Signed)
I called to speak with [name] [name] and he wants to meet today at 12:30. He would like this to be an in person and conference call with his brother Dr. Dorma Russell who does not like in [place]

## 2019-08-31 NOTE — Telephone Encounter (Signed)
Meeting occurred at 1230 today.

## 2019-09-03 ENCOUNTER — Non-Acute Institutional Stay (SKILLED_NURSING_FACILITY): Payer: Medicare Other | Admitting: Family

## 2019-09-03 ENCOUNTER — Telehealth: Payer: Self-pay

## 2019-09-03 ENCOUNTER — Encounter: Payer: Self-pay | Admitting: Family

## 2019-09-03 DIAGNOSIS — L89120 Pressure ulcer of left upper back, unstageable: Secondary | ICD-10-CM

## 2019-09-03 DIAGNOSIS — F411 Generalized anxiety disorder: Secondary | ICD-10-CM | POA: Diagnosis not present

## 2019-09-03 DIAGNOSIS — R54 Age-related physical debility: Secondary | ICD-10-CM

## 2019-09-03 DIAGNOSIS — I5041 Acute combined systolic (congestive) and diastolic (congestive) heart failure: Secondary | ICD-10-CM

## 2019-09-03 DIAGNOSIS — J189 Pneumonia, unspecified organism: Secondary | ICD-10-CM

## 2019-09-03 DIAGNOSIS — Z515 Encounter for palliative care: Secondary | ICD-10-CM

## 2019-09-03 DIAGNOSIS — L89100 Pressure ulcer of unspecified part of back, unstageable: Secondary | ICD-10-CM | POA: Diagnosis not present

## 2019-09-03 MED ORDER — MORPHINE SULFATE (CONCENTRATE) 20 MG/ML PO SOLN: 5.0000 mg | ORAL | 0 refills | Status: DC | PRN

## 2019-09-03 MED ORDER — LORAZEPAM 2 MG/ML PO CONC: 0.6000 mg | ORAL | 0 refills | Status: AC | PRN

## 2019-09-03 MED ORDER — ATROPINE SULFATE 1 % OP SOLN: 2.0000 [drp] | Freq: Four times a day (QID) | OPHTHALMIC | 0 refills | Status: AC | PRN

## 2019-09-03 NOTE — Telephone Encounter (Signed)
[name] [name] would like a Hospice order sent in for [name] [name] she stated that his health has declined fast since Friday. Hospice order was given .

## 2019-09-03 NOTE — Telephone Encounter (Signed)
[name] [name] has declined over the weekend, especially yesterday.  Family declined nebulizer for his dyspnea and he's having increased secretions, dyspnea and anxiety.  [name] called for a hospice order.  Order given along with comfort meds as followed based on decisions [name] indicated were desired for his father: roxanol 5mg  q4hprn sob, pain, lorazepam 0.5mg  q4h prn anxiety, dyspnea, and atropine drops 2 drops q6h prn increased secretions--verbal orders given.  Advised to call me back or consult with NP for adjusted doses and frequencies if needed after she assesses him.    [name] L. Author [place], D.O. [facility] [address], [place] [address] Cell Phone (Mon-Fri 8am-5pm):  [phone] On Call:  [phone] & follow prompts after 5pm & weekends Office Phone:  [phone] Office Fax:  [phone]

## 2019-09-03 NOTE — Progress Notes (Signed)
Location:    [facility] Room Number: 505/P Place of Service:  SNF [phone]) Provider:  Lin Glazier Np  Lawerance Cruel, MD  Patient Care Team: Lawerance Cruel, MD as PCP - General ([place]) Jettie Booze, MD as PCP - Cardiology (Cardiology) Deboraha Sprang, MD as PCP - Electrophysiology (Cardiology) Wyatt Portela, MD (Hematology and Oncology) Kathie Rhodes, MD as Attending Physician (Urology)  Extended Emergency Contact Information Primary Emergency Contact: Schnepp,[name] Address: [place], [place] [address] [name] of [place] Phone: [phone] Mobile Phone: [phone] Relation: Son Secondary Emergency Contact: Eligah, Anello Mobile Phone: [phone] Relation: Son  Code Status:  DNR Goals of care: Advanced Directive information Advanced Directives 09/03/2019  Does Patient Have a Medical Advance Directive? Yes  Type of Advance Directive Out of facility DNR (pink MOST or yellow form)  Does patient want to make changes to medical advance directive? No - Patient declined  Copy of [place] in Chart? -  Would patient like information on creating a medical advance directive? -  Pre-existing out of facility DNR order (yellow form or pink MOST form) Yellow form placed in chart (order not valid for inpatient use)     Chief Complaint  Patient presents with  . Follow-up    Pain Medication    HPI:  Pt is a 86 y.o. male seen today for an acute visit for evaluation of pain.He is seen in his room with facility Nurse and patient's daughter present at bedside.Patient son Dr.Marciano called to re-evaluate patient pain medication.Dr.Reed out of the facility today was notified Morphine oral solution was ordered.Ativan, atropine and Hospice service were also ordered due to patient's deconditioning.Per Nurse family declined oxygen for end of life comfort.Patient's POA request Morphine  oral solution to be increased.Morphine to be scheduled by facility protocol 5 mg (0.25 ml ) by mouth every 4 hrs and 5 mg (0.25 ml ) every 2 hrs as needed for pain or shortness of breath.   Past Medical History:  Diagnosis Date  . Abdominal hernia   . Abnormality of gait    due to low back pain , uses  cane and walker  . Anemia of chronic disease    Dr. Alen Blew  . Arthritis   . Bilateral lower extremity edema    wears TED hose  . Cancer [facility])    prostate- treated with radiation  . Cardiac pacemaker in situ 10/11/2006   medtronic  . Chronic iron deficiency anemia oncologist/ hematoloist-- dr Alen Blew   treatment IV Iron infusions  . Chronic low back pain   . CKD (chronic kidney disease), stage III   . COPD with emphysema [facility])    pulmologist-  dr Elsworth Soho  . DDD (degenerative disc disease), lumbosacral   . Degenerative scoliosis   . Dyspnea    with exertion  . Full dentures   . GERD (gastroesophageal reflux disease)   . Gross hematuria   . Headache    history of migraines- "way in the past"  . Hereditary and idiopathic peripheral neuropathy    bilateral lower leg  . Hiatal hernia   . History of DVT of lower extremity 10/19/2011   chronic distal popliteal right lower extremity  . History of external beam radiation therapy 2008   prostate cancer  . History of pancreatitis 2013  . History of pneumothorax 09/2006   iatrogenic-- in setting cardiac pacemaker placement  .  History of prostate cancer 2008   s/p  external radiation therapy  . Hypertension   . Injury of left ulnar nerve 06/2015   Dr. Jannifer Franklin   . Left carpal tunnel syndrome   . Lesion of bladder   . Macular degeneration of both eyes   . Macular degeneration of right eye   . Mild mitral regurgitation   . Mild mitral regurgitation    and trace AI 07/22/08 echo and 10/2010  . Mild pulmonary hypertension ([place])   . Nocturia   . OSA on CPAP   . PAF (paroxysmal atrial fibrillation) ([place]) 10/2011   1st episode documented 09/  2013 admission for gallstons/ pancreatitis and prior to cholecystectomy , went back in NSR without interventeion  . Pancreatitis 10/2011   gallstone induced  . Presence of permanent cardiac pacemaker   . Prostate cancer ([place])    Dr. Karsten Ro  . Sinus node dysfunction (HCC)    s/p  cardiac pacemaker placement 10-11-2006  . Sinus node dysfunction (HCC)    pacemaker  . Spinal stenosis    L2  . Venous insufficiency   . Wears glasses   . Wears hearing aid in both ears    Past Surgical History:  Procedure Laterality Date  . BALLOON DILATION N/A 01/14/2015   Procedure: BALLOON DILATION;  Surgeon: Garlan Fair, MD;  Location: [facility];  Service: Endoscopy;  Laterality: N/A;  . BALLOON DILATION N/A 10/12/2017   Procedure: BALLOON DILATION;  Surgeon: Otis Brace, MD;  Location: [facility] ENDOSCOPY;  Service: Gastroenterology;  Laterality: N/A;  . BIOPSY  10/12/2017   Procedure: BIOPSY;  Surgeon: Otis Brace, MD;  Location: [facility] ENDOSCOPY;  Service: Gastroenterology;;  . CARDIAC CATHETERIZATION  08-29-2000   [facility]   no significant obstructive CAD, intact globle LV size and systolic function with regional wall motion abnormalities noted,  ?coronary spasm producing wall motion abnormality, elevated CPK and chest pain (ef 55%)  . CARDIAC PACEMAKER PLACEMENT  10-11-2006    dr Leonia Reeves   W/  ATRIAL LEAD REVISION 10-12-2006   (medtronic)  . CARDIOVASCULAR STRESS TEST  11-18-2011    dr Irish Lack   Low risk nuclear study w/ no ishemia/  normal wall motion,  post-stress ef 48% (LVEF 56% on prior study 2008)  . CARDIOVERSION N/A 04/02/2019   Procedure: CARDIOVERSION;  Surgeon: Josue Hector, MD;  Location: [facility] ENDOSCOPY;  Service: Cardiovascular;  Laterality: N/A;  . CARDIOVERSION N/A 07/24/2019   Procedure: CARDIOVERSION;  Surgeon: Dorothy Spark, MD;  Location: [facility] ENDOSCOPY;  Service: Cardiovascular;  Laterality: N/A;  . CARPAL TUNNEL RELEASE Left 11/14/2014   Procedure: CARPAL TUNNEL RELEASE  LEFT THUMB;  Surgeon: Daryll Brod, MD;  Location: [facility];  Service: Orthopedics;  Laterality: Left;  ANESTHESIA:  IV REGIONAL FAB  . CATARACT EXTRACTION W/ INTRAOCULAR LENS  IMPLANT, BILATERAL    . CHOLECYSTECTOMY  10/23/2011  . CHOLECYSTECTOMY  10/23/2011   Procedure: CHOLECYSTECTOMY;  Surgeon: Rolm Bookbinder, MD;  Location: [facility];  Service: General;  Laterality: N/A;  with intraoperative cholangiogram  . COLONOSCOPY    . ESOPHAGOGASTRODUODENOSCOPY (EGD) WITH PROPOFOL N/A 01/14/2015   Procedure: ESOPHAGOGASTRODUODENOSCOPY (EGD) WITH PROPOFOL;  Surgeon: Garlan Fair, MD;  Location: [facility] ENDOSCOPY;  Service: Endoscopy;  Laterality: N/A;  . ESOPHAGOGASTRODUODENOSCOPY (EGD) WITH PROPOFOL N/A 10/12/2017   Procedure: ESOPHAGOGASTRODUODENOSCOPY (EGD) WITH PROPOFOL;  Surgeon: Otis Brace, MD;  Location: [facility];  Service: Gastroenterology;  Laterality: N/A;  . I & D EXTREMITY Left 10/20/2012   Procedure: LEFT  LEG IRRIGATION AND DEBRIDEMENT, AND WOUND VAC APPLICATION;  Surgeon: Marianna Payment, MD;  Location: [facility] ORS;  Service: Orthopedics;  Laterality: Left;  . I & D EXTREMITY Left 10/22/2012   Procedure: IRRIGATION AND DEBRIDEMENT EXTREMITY;  Surgeon: Marianna Payment, MD;  Location: [facility] ORS;  Service: Orthopedics;  Laterality: Left;  . INGUINAL HERNIA REPAIR Left yrs ago  . LAPAROSCOPIC ASSISTED VENTRAL HERNIA REPAIR  08-08-2009   dr Excell Seltzer   AND OPEN REPAIR RECURRENT LEFT INGUINAL HERNIA  . LAPAROSCOPIC INGUINAL HERNIA REPAIR Left 07-24-1999    dr Excell Seltzer  . OPEN VENTRAL HERNIA REPAIR /  LYSIS ADHESIONS  09-23-2010    dr Donne Hazel  . ORIF ELBOW FRACTURE Left 03/29/2015   Procedure: LEFT ELBOW OPEN REDUCTION INTERNAL FIXATION (ORIF) DISTAL HUMERUS FRACTURE WITH OLECRANON OSTEOTOMY AND ULNAR NERVE RELEASE;  Surgeon: Roseanne Kaufman, MD;  Location: [facility];  Service: Orthopedics;  Laterality: Left;  . PPM GENERATOR CHANGEOUT N/A 02/10/2018   Procedure: PPM GENERATOR  CHANGEOUT;  Surgeon: Deboraha Sprang, MD;  Location: [facility] CV LAB;  Service: Cardiovascular;  Laterality: N/A;  . REPAIR SUPRAUMBILICAL HERNIA   [id number]   dr Excell Seltzer  . SHOULDER ARTHROSCOPY WITH DISTAL CLAVICLE RESECTION Right 11-07-2007   dr Rhona Raider   [place] ACROMIOPLASTY  . SKIN SPLIT GRAFT Left 10/22/2012   Procedure: SKIN GRAFT SPLIT THICKNESS;  Surgeon: Marianna Payment, MD;  Location: [facility];  Service: Orthopedics;  Laterality: Left;  . TEE WITHOUT CARDIOVERSION N/A 07/24/2019   Procedure: TRANSESOPHAGEAL ECHOCARDIOGRAM (TEE);  Surgeon: Dorothy Spark, MD;  Location: [facility] ENDOSCOPY;  Service: Cardiovascular;  Laterality: N/A;  . TOTAL KNEE ARTHROPLASTY Right 1990's  . TRANSTHORACIC ECHOCARDIOGRAM  06-12-2012   dr Irish Lack   ef 50-55%/ mild LAE/ mild MR and TR/  AV sclerosis without stenosis/  RVSP 58mmHg  . TRANSURETHRAL RESECTION OF BLADDER TUMOR N/A 07/15/2017   Procedure: CYSTOSCOPY TRANSURETHRAL RESECTION OF BLADDER TUMOR (TURBT);  Surgeon: Kathie Rhodes, MD;  Location: [facility];  Service: Urology;  Laterality: N/A;    No Known Allergies  Allergies as of 09/03/2019   No Known Allergies     Medication List       Accurate as of September 03, 2019  3:45 PM. If you have any questions, ask your nurse or doctor.        acetaminophen 650 MG CR tablet Commonly known as: TYLENOL Take 650 mg by mouth 2 (two) times daily.   acetaminophen 325 MG tablet Commonly known as: TYLENOL Take 650 mg by mouth every 6 (six) hours as needed (For Pain).   albuterol 108 (90 Base) MCG/ACT inhaler Commonly known as: VENTOLIN HFA Inhale 2 puffs into the lungs every 6 (six) hours as needed for wheezing or shortness of breath.   amiodarone 200 MG tablet Commonly known as: PACERONE Take 200 mg by mouth 2 (two) times daily. For AFIB   Anoro Ellipta 62.5-25 MCG/INH Aepb Generic drug: umeclidinium-vilanterol Inhale 1 puff into the lungs daily.     atropine 1 % ophthalmic solution Place 2 drops under the tongue every 6 (six) hours as needed. Started by: Tanna Savoy, CMA   CALCIUM-D PO Take 1 tablet by mouth every evening.   Eliquis 2.5 MG Tabs tablet Generic drug: apixaban Take 2.5 mg by mouth 2 (two) times daily. For A-Fib   Ensure Take 237 mLs by mouth daily. For Wound Healing   ferrous sulfate 325 (65 FE) MG tablet Take  650 mg by mouth daily with breakfast.   fluticasone 50 MCG/ACT nasal spray Commonly known as: FLONASE Place 1 spray into both nostrils daily as needed for allergies or rhinitis.   Folic Acid-Vit V9-DGL O75 2.5-25-1 MG Tabs tablet Commonly known as: FOLBEE Take 1 tablet by mouth daily.   furosemide 20 MG tablet Commonly known as: LASIX Take 20 mg by mouth daily.   ipratropium-albuterol 0.5-2.5 (3) MG/3ML Soln Commonly known as: DUONEB Take 3 mLs by nebulization every 6 (six) hours. While awake for HCAP x 14 days   LORazepam 2 MG/ML concentrated solution Commonly known as: LORazepam Intensol Take 0.3 mLs (0.6 mg total) by mouth every 4 (four) hours as needed for anxiety (dyspnea). Started by: Tanna Savoy, CMA   losartan 25 MG tablet Commonly known as: COZAAR Take 12.5 mg by mouth daily. FOR ESSENTIAL HTN   metoprolol succinate 25 MG 24 hr tablet Commonly known as: TOPROL-XL Take 1 tablet (25 mg total) by mouth daily.   morphine 20 MG/5ML solution Take 5 mg by mouth every 4 (four) hours. For Pain   morphine 20 MG/5ML solution Take 5 mg by mouth every 2 (two) hours as needed for pain.   Myrbetriq 50 MG Tb24 tablet Generic drug: mirabegron ER Take 50 mg by mouth daily.   NON FORMULARY Diet order: Regular NAS liberalized diet.   NON FORMULARY Magic Cup L/D Meal d/t poor PO Intake.   oxyCODONE 5 MG immediate release tablet Commonly known as: Oxy IR/ROXICODONE Take 5 mg by mouth 2 (two) times daily. For Pain   oxymetazoline 0.05 % nasal spray Commonly known as:  AFRIN Place 1 spray into both nostrils 2 (two) times daily as needed for congestion.   pantoprazole 40 MG tablet Commonly known as: Protonix Take 1 tablet (40 mg total) by mouth daily before breakfast.   polyethylene glycol 17 g packet Commonly known as: MiraLax Take 17 g by mouth daily as needed.   potassium chloride 10 MEQ tablet Commonly known as: KLOR-CON Take 10 mEq by mouth daily. FOR HYPOKALEMIA   predniSONE 10 MG tablet Commonly known as: DELTASONE Take 10 mg by mouth daily with breakfast.   PRESERVISION AREDS 2 PO Take 1 each by mouth in the morning and at bedtime.   psyllium 28 % packet Commonly known as: METAMUCIL SMOOTH TEXTURE Take 1 packet by mouth daily as needed. Mix with 8 oz liquid   saccharomyces boulardii 250 MG capsule Commonly known as: FLORASTOR Take 250 mg by mouth 2 (two) times daily.   senna-docusate 8.6-50 MG tablet Commonly known as: Senokot-S Take 1 tablet by mouth at bedtime as needed for mild constipation.   vitamin B-12 500 MCG tablet Commonly known as: CYANOCOBALAMIN Take 500 mcg by mouth. Three days a week   VITAMIN D3 PO Take 2,000 Units by mouth daily.   Voltaren 1 % Gel Generic drug: diclofenac Sodium Apply 2 g topically 2 (two) times daily. To right shoulder       Review of Systems  Unable to perform ROS: Acuity of condition    Immunization History  Administered Date(s) Administered  . Fluad Quad(high Dose 65+) 11/02/2018  . Influenza Split 12/01/2011, 11/28/2012, 12/05/2013, 11/02/2015, 10/16/2017  . Influenza, High Dose Seasonal PF 11/18/2014, 11/26/2015, 11/23/2016, 11/07/2017  . Influenza,inj,Quad PF,6+ Mos 10/16/2012  . Influenza-Unspecified 11/15/2013  . Moderna SARS-COVID-2 Vaccination 03/06/2019, 04/03/2019  . Pneumococcal Conjugate-13 05/14/2013, 06/03/2014  . Pneumococcal Polysaccharide-23 09/15/2005  . Pneumococcal-Unspecified 05/04/2013  . Td 11/28/2012  . Tdap  06/14/2007, 10/05/2012  . Zoster  04/20/2006, 02/16/2011  . Zoster Recombinat (Shingrix) 04/15/2017, 06/15/2017   Pertinent  Health Maintenance Due  Topic Date Due  . INFLUENZA VACCINE  09/16/2019  . PNA vac Low Risk Adult  Completed   Fall Risk  06/27/2019 12/30/2017  Falls in the past year? 1 1  Number falls in past yr: 1 1  Injury with Fall? 1 1  Comment bruises and skin damage -  Follow up - Falls evaluation completed    Vitals:   09/03/19 1429  BP: 103/63  Pulse: (!) 59  Resp: (!) 22  Temp: 97.8 F (36.6 C)  TempSrc: Oral  SpO2: 92%  Weight: 151 lb (68.5 kg)  Height: 5\' 9"  (1.753 m)   Body mass index is 22.3 kg/m. Physical Exam Vitals reviewed.  Constitutional:      General: He is not in acute distress.    Appearance: He is normal weight.     Comments: Frail   HENT:     Nose: No rhinorrhea.     Mouth/Throat:     Mouth: Mucous membranes are dry.     Pharynx: No oropharyngeal exudate or posterior oropharyngeal erythema.  Eyes:     General: No scleral icterus.       Right eye: No discharge.        Left eye: No discharge.     Conjunctiva/sclera: Conjunctivae normal.     Pupils: Pupils are equal, round, and reactive to light.  Cardiovascular:     Rate and Rhythm: Normal rate and regular rhythm.     Pulses: Normal pulses.     Heart sounds: Normal heart sounds. No murmur heard.  No friction rub. No gallop.   Pulmonary:     Breath sounds: Decreased air movement present. No wheezing, rhonchi or rales.  Chest:     Chest wall: No tenderness.  Abdominal:     General: Bowel sounds are normal. There is no distension.     Palpations: Abdomen is soft. There is no mass.     Tenderness: There is no abdominal tenderness. There is no right CVA tenderness, left CVA tenderness, guarding or rebound.  Musculoskeletal:        General: No swelling or tenderness.     Right lower leg: Edema present.     Left lower leg: Edema present.  Skin:    General: Skin is warm.     Coloration: Skin is not pale.      Findings: No bruising or erythema.     Comments: Mid-back ulcer not visualized patient lying on his back.comfort care per POA   Neurological:     Mental Status: He is alert.  Psychiatric:        Mood and Affect: Mood is anxious.    Labs reviewed: Recent Labs    07/23/19 0322 07/23/19 0322 07/24/19 0350 07/24/19 0350 07/24/19 1540 07/24/19 1540 07/25/19 0321 07/31/19 0000 08/08/19 0000 08/08/19 0000 08/16/19 0000 08/23/19 0000 08/27/19 0000  NA 136   < > 139   < > 139   < > 140   < > 137  --  140 144  --   K 4.5   < > 4.9   < > 4.5   < > 4.3   < > 3.4  --  3.7 3.8  --   CL 96*   < > 100   < > 102   < > 102   < > 103  --  106 108  --  CO2 32   < > 29   < > 31   < > 30   < > 25*  --  26* 23*  --   GLUCOSE 105*   < > 87  --  135*  --  87  --   --   --   --   --   --   BUN 50*   < > 47*   < > 44*   < > 42*   < > 23*  --  22* 28*  --   CREATININE 1.42*   < > 1.39*   < > 1.23   < > 1.35*   < > 0.9   < > 0.7 0.6 0.9  CALCIUM 8.3*   < > 8.4*   < > 8.0*   < > 7.9*   < > 8.3*   < > 8.3* 8.4* 8.4*  MG 2.1  --  2.1  --   --   --  2.0  --   --   --   --   --   --    < > = values in this interval not displayed.   Recent Labs    06/27/19 1221 07/10/19 1620  AST  --  38  ALT  --  30  ALKPHOS  --  81  BILITOT  --  1.9*  PROT 5.8* 5.7*  ALBUMIN  --  3.4*   Recent Labs    12/11/18 1108 07/15/19 0303 07/15/19 0303 07/16/19 0234 07/16/19 0234 07/19/19 1411 07/20/19 0405 07/23/19 0322 07/23/19 0322 07/24/19 0350 07/24/19 0350 07/25/19 0321 07/31/19 0000 07/31/19 0000 08/08/19 0000 08/23/19 0000 08/27/19 0000  WBC   < > 10.3   < > 9.3   < > 8.5   < > 7.8   < > 7.5   < > 6.6 7.0   < > 7.2 7.8 11.0  NEUTROABS  --  9.0*  --  8.5*  --  7.6  --   --   --   --   --   --   --   --   --   --   --   HGB   < > 13.1   < > 11.9*   < > 12.6*   < > 11.3*   < > 11.1*   < > 10.4* 11.4*   < > 10.1* 9.8* 10.5*  HCT   < > 43.1   < > 39.0   < > 40.2   < > 36.4*   < > 36.6*   < > 33.9* 35*    < > 30* 30* 32*  MCV  --  109.4*   < > 111.1*   < > 108.1*   < > 107.7*  --  111.2*  --  109.0*  --   --   --   --   --   PLT   < > 136*   < > 115*   < > 142*   < > 149*   < > 158   < > 147* 168  --  112*  --  189   < > = values in this interval not displayed.   Lab Results  Component Value Date   TSH 1.512 07/11/2019   No results found for: HGBA1C No results found for: CHOL, HDL, LDLCALC, LDLDIRECT, TRIG, CHOLHDL  Significant Diagnostic Results in last 30 days:  CUP PACEART REMOTE DEVICE CHECK  Result Date: 08/24/2019 Scheduled  remote reviewed. Normal device function.  3 AT/AF (longest 6 minutes). Has known PAF. EMR med list includes amiodarone and Eliquis. Next remote 91 days. Felisa Bonier, RN, MSN   Assessment/Plan 1. Frailty syndrome in geriatric patient Progressive decline over the weekend with generalized weakness.Poor oral intake.Code status DNR no further hospitalization per POA.End of life comfort medication Ativan,atropine and Morphine oral solution ordered by Dr.Reed. POA request Morphine dose to be increased for pain.Morphine dosage adjusted  to be scheduled per facility protocol 5 mg (0.25 ml ) by mouth every 4 hrs and 5 mg (0.25 ml ) every 2 hrs as needed for pain or shortness of breath. Continue to monitor for pain control.   2. HCAP (healthcare-associated pneumonia) Has completed Oral Levaquin 09/02/2019.bilateral lung decreased air entry though not following instruction to take deep breath.   3. Acute combined systolic and diastolic heart failure (HCC) Bilateral lower extremity edema.   4. Pressure ulcer of left upper back, unstageable ([place]) PU not visualized today changed by wound care Nurse.POA requested patient not to be disturbed by repositioning for now.continue wound care.status post oral antibiotics completion previous peri-wound erythema drainage and odor has improved per wound Nurse.   5. Generalized anxiety   Continue on Ativan as needed.   Family/ staff  Communication: Reviewed plan of care with patient's daughter and facility Nurse present at bedside.   Labs/tests ordered:  None

## 2019-09-05 NOTE — Addendum Note (Signed)
Addended byMarlowe Sax C on: 09/05/2019 10:25 AM   Modules accepted: Level of Service

## 2019-09-06 ENCOUNTER — Encounter: Payer: Self-pay | Admitting: Internal Medicine

## 2019-09-16 DEATH — deceased

## 2019-11-07 ENCOUNTER — Ambulatory Visit: Payer: Medicare Other | Admitting: Pulmonary Disease
